# Patient Record
Sex: Female | Born: 1935 | Race: White | Hispanic: No | Smoking: Former smoker
Health system: Southern US, Community
[De-identification: ages and names within clinical notes are randomized; demographics above are authoritative.]

## PROBLEM LIST (undated history)

## (undated) DIAGNOSIS — N159 Renal tubulo-interstitial disease, unspecified: Secondary | ICD-10-CM

## (undated) DIAGNOSIS — K649 Unspecified hemorrhoids: Secondary | ICD-10-CM

## (undated) DIAGNOSIS — I2510 Atherosclerotic heart disease of native coronary artery without angina pectoris: Secondary | ICD-10-CM

## (undated) DIAGNOSIS — N3090 Cystitis, unspecified without hematuria: Secondary | ICD-10-CM

## (undated) DIAGNOSIS — N189 Chronic kidney disease, unspecified: Secondary | ICD-10-CM

## (undated) DIAGNOSIS — I482 Chronic atrial fibrillation, unspecified: Secondary | ICD-10-CM

## (undated) DIAGNOSIS — I639 Cerebral infarction, unspecified: Secondary | ICD-10-CM

## (undated) DIAGNOSIS — Z9889 Other specified postprocedural states: Secondary | ICD-10-CM

## (undated) DIAGNOSIS — E860 Dehydration: Secondary | ICD-10-CM

## (undated) DIAGNOSIS — D631 Anemia in chronic kidney disease: Secondary | ICD-10-CM

## (undated) DIAGNOSIS — I878 Other specified disorders of veins: Secondary | ICD-10-CM

## (undated) DIAGNOSIS — I7121 Aneurysm of the ascending aorta, without rupture: Secondary | ICD-10-CM

## (undated) DIAGNOSIS — N183 Chronic kidney disease, stage 3 unspecified: Secondary | ICD-10-CM

## (undated) DIAGNOSIS — D0510 Intraductal carcinoma in situ of unspecified breast: Secondary | ICD-10-CM

## (undated) DIAGNOSIS — I071 Rheumatic tricuspid insufficiency: Secondary | ICD-10-CM

## (undated) DIAGNOSIS — C439 Malignant melanoma of skin, unspecified: Secondary | ICD-10-CM

## (undated) DIAGNOSIS — I712 Thoracic aortic aneurysm, without rupture: Secondary | ICD-10-CM

## (undated) DIAGNOSIS — C4370 Malignant melanoma of unspecified lower limb, including hip: Secondary | ICD-10-CM

## (undated) DIAGNOSIS — Z86718 Personal history of other venous thrombosis and embolism: Secondary | ICD-10-CM

## (undated) DIAGNOSIS — S72009A Fracture of unspecified part of neck of unspecified femur, initial encounter for closed fracture: Secondary | ICD-10-CM

## (undated) DIAGNOSIS — I340 Nonrheumatic mitral (valve) insufficiency: Secondary | ICD-10-CM

## (undated) DIAGNOSIS — K259 Gastric ulcer, unspecified as acute or chronic, without hemorrhage or perforation: Secondary | ICD-10-CM

## (undated) DIAGNOSIS — D509 Iron deficiency anemia, unspecified: Principal | ICD-10-CM

## (undated) DIAGNOSIS — I10 Essential (primary) hypertension: Secondary | ICD-10-CM

## (undated) DIAGNOSIS — Y92009 Unspecified place in unspecified non-institutional (private) residence as the place of occurrence of the external cause: Secondary | ICD-10-CM

## (undated) DIAGNOSIS — W19XXXA Unspecified fall, initial encounter: Secondary | ICD-10-CM

## (undated) DIAGNOSIS — E079 Disorder of thyroid, unspecified: Secondary | ICD-10-CM

## (undated) DIAGNOSIS — I351 Nonrheumatic aortic (valve) insufficiency: Secondary | ICD-10-CM

## (undated) DIAGNOSIS — G459 Transient cerebral ischemic attack, unspecified: Secondary | ICD-10-CM

## (undated) DIAGNOSIS — I509 Heart failure, unspecified: Secondary | ICD-10-CM

## (undated) DIAGNOSIS — I428 Other cardiomyopathies: Secondary | ICD-10-CM

## (undated) HISTORY — PX: TONSILLECTOMY: SUR1361

## (undated) HISTORY — DX: Chronic atrial fibrillation, unspecified: I48.20

## (undated) HISTORY — DX: Chronic kidney disease, stage 3 unspecified: N18.30

## (undated) HISTORY — DX: Iron deficiency anemia, unspecified: D50.9

## (undated) HISTORY — DX: Unspecified place in unspecified non-institutional (private) residence as the place of occurrence of the external cause: Y92.009

## (undated) HISTORY — DX: Other specified postprocedural states: Z98.890

## (undated) HISTORY — DX: Chronic kidney disease, stage 3 (moderate): N18.3

## (undated) HISTORY — PX: CATARACT EXTRACTION, BILATERAL: SHX1313

## (undated) HISTORY — PX: CARDIAC CATHETERIZATION: SHX172

## (undated) HISTORY — DX: Nonrheumatic aortic (valve) insufficiency: I35.1

## (undated) HISTORY — DX: Dehydration: E86.0

## (undated) HISTORY — DX: Thoracic aortic aneurysm, without rupture: I71.2

## (undated) HISTORY — DX: Fracture of unspecified part of neck of unspecified femur, initial encounter for closed fracture: S72.009A

## (undated) HISTORY — PX: VARICOSE VEIN SURGERY: SHX832

## (undated) HISTORY — DX: Essential (primary) hypertension: I10

## (undated) HISTORY — PX: LEG SURGERY: SHX1003

## (undated) HISTORY — PX: TOTAL HIP ARTHROPLASTY: SHX124

## (undated) HISTORY — DX: Anemia in chronic kidney disease: D63.1

## (undated) HISTORY — DX: Rheumatic tricuspid insufficiency: I07.1

## (undated) HISTORY — DX: Cystitis, unspecified without hematuria: N30.90

## (undated) HISTORY — DX: Personal history of other venous thrombosis and embolism: Z86.718

## (undated) HISTORY — DX: Cerebral infarction, unspecified: I63.9

## (undated) HISTORY — DX: Other specified disorders of veins: I87.8

## (undated) HISTORY — DX: Renal tubulo-interstitial disease, unspecified: N15.9

## (undated) HISTORY — DX: Intraductal carcinoma in situ of unspecified breast: D05.10

## (undated) HISTORY — DX: Aneurysm of the ascending aorta, without rupture: I71.21

## (undated) HISTORY — DX: Transient cerebral ischemic attack, unspecified: G45.9

## (undated) HISTORY — PX: CHOLECYSTECTOMY: SHX55

## (undated) HISTORY — DX: Other cardiomyopathies: I42.8

## (undated) HISTORY — DX: Nonrheumatic mitral (valve) insufficiency: I34.0

## (undated) HISTORY — DX: Unspecified fall, initial encounter: W19.XXXA

## (undated) HISTORY — DX: Unspecified hemorrhoids: K64.9

## (undated) HISTORY — DX: Gastric ulcer, unspecified as acute or chronic, without hemorrhage or perforation: K25.9

## (undated) HISTORY — DX: Chronic kidney disease, unspecified: N18.9

## (undated) HISTORY — DX: Malignant melanoma of unspecified lower limb, including hip: C43.70

## (undated) HISTORY — DX: Disorder of thyroid, unspecified: E07.9

## (undated) HISTORY — DX: Malignant melanoma of skin, unspecified: C43.9

## (undated) HISTORY — PX: HEMORRHOID BANDING: SHX5850

---
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
## 1974-08-04 HISTORY — PX: PARTIAL HYSTERECTOMY: SHX80

## 1996-08-04 HISTORY — PX: BREAST LUMPECTOMY: SHX2

## 2000-04-09 ENCOUNTER — Ambulatory Visit: Admission: RE | Admit: 2000-04-09 | Discharge: 2000-04-09 | Payer: Self-pay | Admitting: Specialist

## 2000-04-09 ENCOUNTER — Encounter: Payer: Self-pay | Admitting: Specialist

## 2000-05-12 ENCOUNTER — Inpatient Hospital Stay (HOSPITAL_COMMUNITY): Admission: RE | Admit: 2000-05-12 | Discharge: 2000-05-15 | Payer: Self-pay | Admitting: Specialist

## 2000-05-12 ENCOUNTER — Encounter (INDEPENDENT_AMBULATORY_CARE_PROVIDER_SITE_OTHER): Payer: Self-pay | Admitting: Specialist

## 2000-05-12 ENCOUNTER — Encounter: Payer: Self-pay | Admitting: Specialist

## 2000-05-15 ENCOUNTER — Inpatient Hospital Stay
Admission: RE | Admit: 2000-05-15 | Discharge: 2000-05-27 | Payer: Self-pay | Admitting: Physical Medicine & Rehabilitation

## 2000-10-20 ENCOUNTER — Encounter: Payer: Self-pay | Admitting: Specialist

## 2000-10-20 ENCOUNTER — Ambulatory Visit (HOSPITAL_COMMUNITY): Admission: RE | Admit: 2000-10-20 | Discharge: 2000-10-20 | Payer: Self-pay | Admitting: Specialist

## 2001-04-21 ENCOUNTER — Ambulatory Visit (HOSPITAL_COMMUNITY): Admission: RE | Admit: 2001-04-21 | Discharge: 2001-04-21 | Payer: Self-pay | Admitting: General Surgery

## 2001-04-21 ENCOUNTER — Encounter: Payer: Self-pay | Admitting: General Surgery

## 2001-04-27 ENCOUNTER — Other Ambulatory Visit: Admission: RE | Admit: 2001-04-27 | Discharge: 2001-04-27 | Payer: Self-pay | Admitting: General Surgery

## 2001-11-02 ENCOUNTER — Encounter: Payer: Self-pay | Admitting: Family Medicine

## 2001-11-02 ENCOUNTER — Ambulatory Visit (HOSPITAL_COMMUNITY): Admission: RE | Admit: 2001-11-02 | Discharge: 2001-11-02 | Payer: Self-pay | Admitting: Family Medicine

## 2002-04-25 ENCOUNTER — Encounter: Payer: Self-pay | Admitting: General Surgery

## 2002-04-25 ENCOUNTER — Ambulatory Visit (HOSPITAL_COMMUNITY): Admission: RE | Admit: 2002-04-25 | Discharge: 2002-04-25 | Payer: Self-pay | Admitting: General Surgery

## 2002-08-26 ENCOUNTER — Ambulatory Visit (HOSPITAL_COMMUNITY): Admission: RE | Admit: 2002-08-26 | Discharge: 2002-08-26 | Payer: Self-pay | Admitting: Specialist

## 2002-08-26 ENCOUNTER — Encounter: Payer: Self-pay | Admitting: Specialist

## 2002-09-23 ENCOUNTER — Encounter: Payer: Self-pay | Admitting: Specialist

## 2002-09-27 ENCOUNTER — Encounter (INDEPENDENT_AMBULATORY_CARE_PROVIDER_SITE_OTHER): Payer: Self-pay | Admitting: *Deleted

## 2002-09-27 ENCOUNTER — Inpatient Hospital Stay (HOSPITAL_COMMUNITY): Admission: RE | Admit: 2002-09-27 | Discharge: 2002-10-04 | Payer: Self-pay | Admitting: Specialist

## 2002-09-27 ENCOUNTER — Encounter: Payer: Self-pay | Admitting: Specialist

## 2002-10-04 ENCOUNTER — Inpatient Hospital Stay: Admission: AD | Admit: 2002-10-04 | Discharge: 2002-10-21 | Payer: Self-pay | Admitting: Family Medicine

## 2003-03-23 ENCOUNTER — Inpatient Hospital Stay (HOSPITAL_COMMUNITY): Admission: EM | Admit: 2003-03-23 | Discharge: 2003-03-25 | Payer: Self-pay | Admitting: Emergency Medicine

## 2003-03-23 ENCOUNTER — Encounter: Payer: Self-pay | Admitting: *Deleted

## 2003-05-12 ENCOUNTER — Ambulatory Visit (HOSPITAL_COMMUNITY): Admission: RE | Admit: 2003-05-12 | Discharge: 2003-05-12 | Payer: Self-pay | Admitting: General Surgery

## 2003-05-12 ENCOUNTER — Encounter: Payer: Self-pay | Admitting: General Surgery

## 2003-10-24 ENCOUNTER — Ambulatory Visit (HOSPITAL_COMMUNITY): Admission: RE | Admit: 2003-10-24 | Discharge: 2003-10-24 | Payer: Self-pay | Admitting: Ophthalmology

## 2003-10-29 ENCOUNTER — Emergency Department (HOSPITAL_COMMUNITY): Admission: EM | Admit: 2003-10-29 | Discharge: 2003-10-29 | Payer: Self-pay | Admitting: Emergency Medicine

## 2004-02-13 ENCOUNTER — Ambulatory Visit (HOSPITAL_COMMUNITY): Admission: RE | Admit: 2004-02-13 | Discharge: 2004-02-13 | Payer: Self-pay | Admitting: Ophthalmology

## 2004-02-24 ENCOUNTER — Emergency Department (HOSPITAL_COMMUNITY): Admission: EM | Admit: 2004-02-24 | Discharge: 2004-02-24 | Payer: Self-pay | Admitting: Emergency Medicine

## 2004-03-05 ENCOUNTER — Ambulatory Visit (HOSPITAL_COMMUNITY): Admission: RE | Admit: 2004-03-05 | Discharge: 2004-03-05 | Payer: Self-pay | Admitting: Internal Medicine

## 2004-03-25 ENCOUNTER — Encounter: Admission: RE | Admit: 2004-03-25 | Discharge: 2004-03-25 | Payer: Self-pay | Admitting: Orthopedic Surgery

## 2004-05-22 ENCOUNTER — Ambulatory Visit (HOSPITAL_COMMUNITY): Admission: RE | Admit: 2004-05-22 | Discharge: 2004-05-22 | Payer: Self-pay | Admitting: General Surgery

## 2004-11-16 ENCOUNTER — Emergency Department (HOSPITAL_COMMUNITY): Admission: EM | Admit: 2004-11-16 | Discharge: 2004-11-16 | Payer: Self-pay | Admitting: Emergency Medicine

## 2004-12-25 ENCOUNTER — Ambulatory Visit (HOSPITAL_COMMUNITY): Admission: RE | Admit: 2004-12-25 | Discharge: 2004-12-25 | Payer: Self-pay | Admitting: Family Medicine

## 2005-01-02 ENCOUNTER — Ambulatory Visit: Payer: Self-pay | Admitting: Orthopedic Surgery

## 2005-01-15 ENCOUNTER — Ambulatory Visit: Payer: Self-pay | Admitting: Orthopedic Surgery

## 2005-01-22 ENCOUNTER — Ambulatory Visit: Payer: Self-pay | Admitting: Orthopedic Surgery

## 2005-01-29 ENCOUNTER — Ambulatory Visit: Payer: Self-pay | Admitting: Orthopedic Surgery

## 2005-03-03 ENCOUNTER — Ambulatory Visit: Payer: Self-pay | Admitting: Orthopedic Surgery

## 2005-03-07 ENCOUNTER — Ambulatory Visit (HOSPITAL_COMMUNITY): Admission: RE | Admit: 2005-03-07 | Discharge: 2005-03-07 | Payer: Self-pay | Admitting: Orthopedic Surgery

## 2005-03-07 ENCOUNTER — Encounter: Payer: Self-pay | Admitting: Orthopedic Surgery

## 2005-03-17 ENCOUNTER — Ambulatory Visit: Payer: Self-pay | Admitting: Orthopedic Surgery

## 2005-03-31 ENCOUNTER — Ambulatory Visit: Payer: Self-pay | Admitting: Orthopedic Surgery

## 2005-05-01 ENCOUNTER — Emergency Department (HOSPITAL_COMMUNITY): Admission: EM | Admit: 2005-05-01 | Discharge: 2005-05-01 | Payer: Self-pay | Admitting: Emergency Medicine

## 2005-05-19 ENCOUNTER — Ambulatory Visit (HOSPITAL_COMMUNITY): Admission: RE | Admit: 2005-05-19 | Discharge: 2005-05-19 | Payer: Self-pay | Admitting: Family Medicine

## 2005-05-27 ENCOUNTER — Ambulatory Visit (HOSPITAL_COMMUNITY): Admission: RE | Admit: 2005-05-27 | Discharge: 2005-05-27 | Payer: Self-pay | Admitting: Ophthalmology

## 2005-06-23 ENCOUNTER — Ambulatory Visit: Payer: Self-pay | Admitting: Orthopedic Surgery

## 2005-09-02 ENCOUNTER — Ambulatory Visit (HOSPITAL_COMMUNITY): Admission: RE | Admit: 2005-09-02 | Discharge: 2005-09-02 | Payer: Self-pay | Admitting: Ophthalmology

## 2005-09-29 ENCOUNTER — Ambulatory Visit: Payer: Self-pay | Admitting: Internal Medicine

## 2005-10-16 ENCOUNTER — Ambulatory Visit: Payer: Self-pay | Admitting: Internal Medicine

## 2005-10-16 ENCOUNTER — Ambulatory Visit (HOSPITAL_COMMUNITY): Admission: RE | Admit: 2005-10-16 | Discharge: 2005-10-16 | Payer: Self-pay | Admitting: Internal Medicine

## 2005-11-12 ENCOUNTER — Ambulatory Visit (HOSPITAL_COMMUNITY): Admission: RE | Admit: 2005-11-12 | Discharge: 2005-11-12 | Payer: Self-pay | Admitting: Family Medicine

## 2006-01-22 ENCOUNTER — Ambulatory Visit: Payer: Self-pay | Admitting: Internal Medicine

## 2006-04-02 ENCOUNTER — Ambulatory Visit: Payer: Self-pay | Admitting: Orthopedic Surgery

## 2006-05-04 HISTORY — PX: KNEE SURGERY: SHX244

## 2006-05-18 ENCOUNTER — Ambulatory Visit (HOSPITAL_COMMUNITY): Admission: RE | Admit: 2006-05-18 | Discharge: 2006-05-18 | Payer: Self-pay | Admitting: Family Medicine

## 2006-05-19 ENCOUNTER — Inpatient Hospital Stay (HOSPITAL_COMMUNITY): Admission: RE | Admit: 2006-05-19 | Discharge: 2006-05-22 | Payer: Self-pay | Admitting: Orthopedic Surgery

## 2006-05-19 ENCOUNTER — Ambulatory Visit: Payer: Self-pay | Admitting: Orthopedic Surgery

## 2006-05-19 ENCOUNTER — Encounter (INDEPENDENT_AMBULATORY_CARE_PROVIDER_SITE_OTHER): Payer: Self-pay | Admitting: *Deleted

## 2006-06-15 ENCOUNTER — Ambulatory Visit: Payer: Self-pay | Admitting: Orthopedic Surgery

## 2006-07-17 ENCOUNTER — Encounter (INDEPENDENT_AMBULATORY_CARE_PROVIDER_SITE_OTHER): Payer: Self-pay | Admitting: Specialist

## 2006-07-17 ENCOUNTER — Other Ambulatory Visit: Admission: RE | Admit: 2006-07-17 | Discharge: 2006-07-17 | Payer: Self-pay | Admitting: General Surgery

## 2006-07-31 ENCOUNTER — Ambulatory Visit (HOSPITAL_COMMUNITY): Admission: RE | Admit: 2006-07-31 | Discharge: 2006-07-31 | Payer: Self-pay | Admitting: General Surgery

## 2006-08-04 HISTORY — PX: MELANOMA EXCISION: SHX5266

## 2006-08-06 ENCOUNTER — Other Ambulatory Visit: Admission: RE | Admit: 2006-08-06 | Discharge: 2006-08-06 | Payer: Self-pay | Admitting: General Surgery

## 2006-08-06 ENCOUNTER — Encounter (INDEPENDENT_AMBULATORY_CARE_PROVIDER_SITE_OTHER): Payer: Self-pay | Admitting: Specialist

## 2006-08-10 ENCOUNTER — Ambulatory Visit: Payer: Self-pay | Admitting: Orthopedic Surgery

## 2006-08-24 ENCOUNTER — Encounter (HOSPITAL_COMMUNITY): Admission: RE | Admit: 2006-08-24 | Discharge: 2006-09-23 | Payer: Self-pay | Admitting: Oncology

## 2006-08-24 ENCOUNTER — Ambulatory Visit (HOSPITAL_COMMUNITY): Payer: Self-pay | Admitting: Oncology

## 2006-09-19 ENCOUNTER — Emergency Department (HOSPITAL_COMMUNITY): Admission: EM | Admit: 2006-09-19 | Discharge: 2006-09-19 | Payer: Self-pay | Admitting: Emergency Medicine

## 2006-11-09 ENCOUNTER — Ambulatory Visit: Payer: Self-pay | Admitting: Orthopedic Surgery

## 2007-04-22 ENCOUNTER — Encounter (HOSPITAL_COMMUNITY): Admission: RE | Admit: 2007-04-22 | Discharge: 2007-05-04 | Payer: Self-pay | Admitting: Oncology

## 2007-04-22 ENCOUNTER — Ambulatory Visit (HOSPITAL_COMMUNITY): Payer: Self-pay | Admitting: Oncology

## 2007-05-17 ENCOUNTER — Ambulatory Visit: Payer: Self-pay | Admitting: Orthopedic Surgery

## 2007-06-07 ENCOUNTER — Ambulatory Visit: Payer: Self-pay | Admitting: Orthopedic Surgery

## 2007-06-07 DIAGNOSIS — M25569 Pain in unspecified knee: Secondary | ICD-10-CM

## 2007-06-07 DIAGNOSIS — M25559 Pain in unspecified hip: Secondary | ICD-10-CM | POA: Insufficient documentation

## 2007-08-12 ENCOUNTER — Ambulatory Visit (HOSPITAL_COMMUNITY): Admission: RE | Admit: 2007-08-12 | Discharge: 2007-08-12 | Payer: Self-pay | Admitting: General Surgery

## 2007-08-23 ENCOUNTER — Ambulatory Visit (HOSPITAL_COMMUNITY): Payer: Self-pay | Admitting: Oncology

## 2007-08-25 ENCOUNTER — Ambulatory Visit (HOSPITAL_COMMUNITY): Admission: RE | Admit: 2007-08-25 | Discharge: 2007-08-25 | Payer: Self-pay | Admitting: Dermatology

## 2007-11-15 ENCOUNTER — Ambulatory Visit (HOSPITAL_COMMUNITY): Payer: Self-pay | Admitting: Oncology

## 2007-11-15 ENCOUNTER — Encounter (HOSPITAL_COMMUNITY): Admission: RE | Admit: 2007-11-15 | Discharge: 2007-12-15 | Payer: Self-pay | Admitting: Oncology

## 2007-11-23 ENCOUNTER — Ambulatory Visit: Payer: Self-pay | Admitting: Orthopedic Surgery

## 2007-11-23 DIAGNOSIS — IMO0002 Reserved for concepts with insufficient information to code with codable children: Secondary | ICD-10-CM | POA: Insufficient documentation

## 2007-11-23 DIAGNOSIS — M1710 Unilateral primary osteoarthritis, unspecified knee: Secondary | ICD-10-CM

## 2008-01-14 ENCOUNTER — Ambulatory Visit (HOSPITAL_COMMUNITY): Admission: RE | Admit: 2008-01-14 | Discharge: 2008-01-14 | Payer: Self-pay | Admitting: Family Medicine

## 2008-02-01 ENCOUNTER — Ambulatory Visit: Payer: Self-pay | Admitting: Vascular Surgery

## 2008-02-15 ENCOUNTER — Ambulatory Visit (HOSPITAL_COMMUNITY): Payer: Self-pay | Admitting: Oncology

## 2008-02-15 ENCOUNTER — Encounter (HOSPITAL_COMMUNITY): Admission: RE | Admit: 2008-02-15 | Discharge: 2008-03-16 | Payer: Self-pay | Admitting: Oncology

## 2008-03-02 ENCOUNTER — Ambulatory Visit (HOSPITAL_COMMUNITY): Admission: RE | Admit: 2008-03-02 | Discharge: 2008-03-02 | Payer: Self-pay | Admitting: Dermatology

## 2008-04-12 ENCOUNTER — Telehealth: Payer: Self-pay | Admitting: Orthopedic Surgery

## 2008-06-06 ENCOUNTER — Ambulatory Visit (HOSPITAL_COMMUNITY): Admission: RE | Admit: 2008-06-06 | Discharge: 2008-06-06 | Payer: Self-pay | Admitting: Family Medicine

## 2008-08-01 ENCOUNTER — Encounter (HOSPITAL_COMMUNITY): Admission: RE | Admit: 2008-08-01 | Discharge: 2008-08-31 | Payer: Self-pay | Admitting: Oncology

## 2008-08-01 ENCOUNTER — Ambulatory Visit (HOSPITAL_COMMUNITY): Payer: Self-pay | Admitting: Oncology

## 2008-08-21 ENCOUNTER — Ambulatory Visit (HOSPITAL_COMMUNITY): Admission: RE | Admit: 2008-08-21 | Discharge: 2008-08-21 | Payer: Self-pay | Admitting: Family Medicine

## 2008-09-01 ENCOUNTER — Encounter: Payer: Self-pay | Admitting: Orthopedic Surgery

## 2008-09-01 ENCOUNTER — Ambulatory Visit (HOSPITAL_COMMUNITY): Admission: RE | Admit: 2008-09-01 | Discharge: 2008-09-01 | Payer: Self-pay | Admitting: Family Medicine

## 2008-09-06 ENCOUNTER — Encounter (HOSPITAL_COMMUNITY): Admission: RE | Admit: 2008-09-06 | Discharge: 2008-10-06 | Payer: Self-pay | Admitting: Oncology

## 2008-09-28 ENCOUNTER — Ambulatory Visit: Payer: Self-pay | Admitting: Internal Medicine

## 2008-10-02 DIAGNOSIS — Z9889 Other specified postprocedural states: Secondary | ICD-10-CM

## 2008-10-02 HISTORY — DX: Other specified postprocedural states: Z98.890

## 2008-10-10 ENCOUNTER — Encounter: Payer: Self-pay | Admitting: Internal Medicine

## 2008-10-10 ENCOUNTER — Ambulatory Visit: Payer: Self-pay | Admitting: Internal Medicine

## 2008-10-10 ENCOUNTER — Ambulatory Visit (HOSPITAL_COMMUNITY): Admission: RE | Admit: 2008-10-10 | Discharge: 2008-10-10 | Payer: Self-pay | Admitting: Internal Medicine

## 2008-10-14 ENCOUNTER — Encounter: Payer: Self-pay | Admitting: Internal Medicine

## 2008-11-29 ENCOUNTER — Ambulatory Visit: Payer: Self-pay | Admitting: Orthopedic Surgery

## 2009-01-08 ENCOUNTER — Ambulatory Visit: Payer: Self-pay | Admitting: Vascular Surgery

## 2009-05-29 ENCOUNTER — Ambulatory Visit: Payer: Self-pay | Admitting: Orthopedic Surgery

## 2009-05-29 DIAGNOSIS — M76899 Other specified enthesopathies of unspecified lower limb, excluding foot: Secondary | ICD-10-CM | POA: Insufficient documentation

## 2009-08-13 ENCOUNTER — Encounter (HOSPITAL_COMMUNITY): Admission: RE | Admit: 2009-08-13 | Discharge: 2009-09-12 | Payer: Self-pay | Admitting: Oncology

## 2009-08-13 ENCOUNTER — Ambulatory Visit (HOSPITAL_COMMUNITY): Payer: Self-pay | Admitting: Oncology

## 2009-08-24 ENCOUNTER — Ambulatory Visit (HOSPITAL_COMMUNITY): Admission: RE | Admit: 2009-08-24 | Discharge: 2009-08-24 | Payer: Self-pay | Admitting: General Surgery

## 2009-08-31 ENCOUNTER — Encounter: Payer: Self-pay | Admitting: Internal Medicine

## 2009-09-11 ENCOUNTER — Inpatient Hospital Stay (HOSPITAL_COMMUNITY): Admission: AD | Admit: 2009-09-11 | Discharge: 2009-09-12 | Payer: Self-pay | Admitting: Internal Medicine

## 2009-09-11 ENCOUNTER — Encounter (INDEPENDENT_AMBULATORY_CARE_PROVIDER_SITE_OTHER): Payer: Self-pay | Admitting: Internal Medicine

## 2009-09-11 ENCOUNTER — Ambulatory Visit: Payer: Self-pay | Admitting: Cardiology

## 2009-09-21 ENCOUNTER — Encounter (HOSPITAL_COMMUNITY): Admission: RE | Admit: 2009-09-21 | Discharge: 2009-10-21 | Payer: Self-pay | Admitting: Oncology

## 2009-09-28 ENCOUNTER — Ambulatory Visit (HOSPITAL_COMMUNITY): Payer: Self-pay | Admitting: Oncology

## 2009-10-29 ENCOUNTER — Encounter (HOSPITAL_COMMUNITY): Admission: RE | Admit: 2009-10-29 | Discharge: 2009-11-28 | Payer: Self-pay | Admitting: Oncology

## 2010-03-06 ENCOUNTER — Ambulatory Visit (HOSPITAL_COMMUNITY)
Admission: RE | Admit: 2010-03-06 | Discharge: 2010-03-06 | Payer: Self-pay | Source: Home / Self Care | Admitting: Family Medicine

## 2010-03-27 HISTORY — PX: NM MYOCAR PERF WALL MOTION: HXRAD629

## 2010-05-27 ENCOUNTER — Ambulatory Visit: Payer: Self-pay | Admitting: Orthopedic Surgery

## 2010-08-12 ENCOUNTER — Ambulatory Visit (HOSPITAL_COMMUNITY)
Admission: RE | Admit: 2010-08-12 | Discharge: 2010-09-03 | Payer: Self-pay | Source: Home / Self Care | Attending: Oncology | Admitting: Oncology

## 2010-08-12 ENCOUNTER — Encounter (HOSPITAL_COMMUNITY)
Admission: RE | Admit: 2010-08-12 | Discharge: 2010-09-03 | Payer: Self-pay | Source: Home / Self Care | Attending: Oncology | Admitting: Oncology

## 2010-08-19 LAB — COMPREHENSIVE METABOLIC PANEL
ALT: 13 U/L (ref 0–35)
AST: 22 U/L (ref 0–37)
Albumin: 4 g/dL (ref 3.5–5.2)
Alkaline Phosphatase: 68 U/L (ref 39–117)
BUN: 16 mg/dL (ref 6–23)
CO2: 30 mEq/L (ref 19–32)
Calcium: 8.8 mg/dL (ref 8.4–10.5)
Chloride: 105 mEq/L (ref 96–112)
Creatinine, Ser: 0.77 mg/dL (ref 0.4–1.2)
GFR calc Af Amer: >60 mL/min (ref >60)
GFR calc non Af Amer: >60 mL/min (ref >60)
Glucose, Bld: 91 mg/dL (ref 70–99)
Potassium: 3.4 mEq/L — ABNORMAL LOW (ref 3.5–5.1)
Sodium: 144 mEq/L (ref 135–145)
Total Bilirubin: 1.1 mg/dL (ref 0.3–1.2)
Total Protein: 6.7 g/dL (ref 6.0–8.3)

## 2010-08-19 LAB — DIFFERENTIAL
Basophils Absolute: 0 10*3/uL (ref 0.0–0.1)
Basophils Relative: 1 % (ref 0–1)
Eosinophils Absolute: 0.1 10*3/uL (ref 0.0–0.7)
Eosinophils Relative: 2 % (ref 0–5)
Lymphocytes Relative: 39 % (ref 12–46)
Lymphs Abs: 2 10*3/uL (ref 0.7–4.0)
Monocytes Absolute: 0.3 10*3/uL (ref 0.1–1.0)
Monocytes Relative: 6 % (ref 3–12)
Neutro Abs: 2.8 10*3/uL (ref 1.7–7.7)
Neutrophils Relative %: 53 % (ref 43–77)

## 2010-08-19 LAB — CBC
HCT: 38.8 % (ref 36.0–46.0)
Hemoglobin: 13 g/dL (ref 12.0–15.0)
MCH: 30.1 pg (ref 26.0–34.0)
MCHC: 33.5 g/dL (ref 30.0–36.0)
MCV: 89.8 fL (ref 78.0–100.0)
Platelets: 147 10*3/uL — ABNORMAL LOW (ref 150–400)
RBC: 4.32 MIL/uL (ref 3.87–5.11)
RDW: 13.5 % (ref 11.5–15.5)
WBC: 5.3 10*3/uL (ref 4.0–10.5)

## 2010-08-19 LAB — FERRITIN: Ferritin: 36 ng/mL (ref 10–291)

## 2010-08-25 ENCOUNTER — Encounter: Payer: Self-pay | Admitting: Internal Medicine

## 2010-08-26 ENCOUNTER — Ambulatory Visit (HOSPITAL_COMMUNITY)
Admission: RE | Admit: 2010-08-26 | Discharge: 2010-08-26 | Payer: Self-pay | Source: Home / Self Care | Attending: Family Medicine | Admitting: Family Medicine

## 2010-08-29 ENCOUNTER — Encounter: Payer: Self-pay | Admitting: Internal Medicine

## 2010-09-05 NOTE — Letter (Signed)
Summary: [facility]   Imported By: Hoy Morn 08/29/2010 09:41:36  _____________________________________________________________________  External Attachment:    Type:   Image     Comment:   External Document

## 2010-09-05 NOTE — Assessment & Plan Note (Signed)
Summary: yearly recheck on TKA/NEEDS XRAYS/frs   Visit Type:  Follow-up  CC:  right knee replacement.  History of Present Illness: I saw [name] [name] in the office today for a 1 year followup visit.  She is a 77 years old woman with the complaint of:  right knee replacement  Xrays today.  05/19/2006 right total knee replacement.  Medications: same in chart. They added a blood pressure pill, she does not know the name.  The patient reports normal function in her RIGHT knee.  She uses a cane when she is out in the community for fear of falling but no problems with the RIGHT knee  X-rays are obtained x-rays look good  Patient will follow up next year  Allergies: 1)  ! Penicillin 2)  ! Codeine  Review of Systems Musculoskeletal:  denies catching locking or giving way of the RIGHT knee.   Impression & Recommendations: right knee xrays  The  alignment was normal, PTF reduced without tilt or subluxation, no evidence of loosening.  IMPRESSION: normal appearance of the implant   Other Orders: Est. Patient Level III [id number]) Knee x-ray,  3 views [id number])  Patient Instructions: 1)  come back in a year for TKA recheck with xrays 2)  Get some over the counter aspercreme, rub onto knee three times a day.   Orders Added: 1)  Est. Patient Level III [id number] 2)  Knee x-ray,  3 views [id number]

## 2010-09-05 NOTE — Letter (Signed)
Summary: [place]   Imported By: Zeb Comfort 08/31/2009 11:51:36  _____________________________________________________________________  External Attachment:    Type:   Image     Comment:   External Document

## 2010-10-19 LAB — COMPREHENSIVE METABOLIC PANEL
Albumin: 3.7 g/dL (ref 3.5–5.2)
BUN: 16 mg/dL (ref 6–23)
Calcium: 8.5 mg/dL (ref 8.4–10.5)
Chloride: 106 mEq/L (ref 96–112)
Creatinine, Ser: 1.12 mg/dL (ref 0.4–1.2)
GFR calc Af Amer: 56 mL/min — ABNORMAL LOW (ref >60)
GFR calc non Af Amer: 46 mL/min — ABNORMAL LOW (ref >60)
Total Bilirubin: 0.3 mg/dL (ref 0.3–1.2)

## 2010-10-19 LAB — CBC
HCT: 35 % — ABNORMAL LOW (ref 36.0–46.0)
Hemoglobin: 11.7 g/dL — ABNORMAL LOW (ref 12.0–15.0)
RBC: 3.96 MIL/uL (ref 3.87–5.11)
RDW: 16.1 % — ABNORMAL HIGH (ref 11.5–15.5)
WBC: 7.1 10*3/uL (ref 4.0–10.5)

## 2010-10-19 LAB — DIFFERENTIAL
Basophils Relative: 0 % (ref 0–1)
Monocytes Absolute: 0.5 10*3/uL (ref 0.1–1.0)
Neutro Abs: 4.4 10*3/uL (ref 1.7–7.7)

## 2010-10-22 ENCOUNTER — Encounter (HOSPITAL_COMMUNITY): Payer: Medicare Other | Attending: Oncology

## 2010-10-22 ENCOUNTER — Other Ambulatory Visit (HOSPITAL_COMMUNITY): Payer: Medicare Other

## 2010-10-22 DIAGNOSIS — D509 Iron deficiency anemia, unspecified: Secondary | ICD-10-CM | POA: Insufficient documentation

## 2010-10-22 DIAGNOSIS — D649 Anemia, unspecified: Secondary | ICD-10-CM

## 2010-10-22 DIAGNOSIS — Z79899 Other long term (current) drug therapy: Secondary | ICD-10-CM | POA: Insufficient documentation

## 2010-10-22 DIAGNOSIS — Z85820 Personal history of malignant melanoma of skin: Secondary | ICD-10-CM | POA: Insufficient documentation

## 2010-10-22 DIAGNOSIS — Z853 Personal history of malignant neoplasm of breast: Secondary | ICD-10-CM | POA: Insufficient documentation

## 2010-10-23 LAB — URINALYSIS, ROUTINE W REFLEX MICROSCOPIC
Ketones, ur: NEGATIVE mg/dL
Nitrite: NEGATIVE
Protein, ur: NEGATIVE mg/dL

## 2010-10-23 LAB — COMPREHENSIVE METABOLIC PANEL
ALT: 13 U/L (ref 0–35)
Albumin: 3.7 g/dL (ref 3.5–5.2)
BUN: 8 mg/dL (ref 6–23)
CO2: 29 mEq/L (ref 19–32)
Calcium: 8.9 mg/dL (ref 8.4–10.5)
Chloride: 107 mEq/L (ref 96–112)
GFR calc Af Amer: >60 mL/min (ref >60)
Glucose, Bld: 124 mg/dL — ABNORMAL HIGH (ref 70–99)
Potassium: 3.8 mEq/L (ref 3.5–5.1)
Total Bilirubin: 0.9 mg/dL (ref 0.3–1.2)
Total Protein: 6.7 g/dL (ref 6.0–8.3)

## 2010-10-23 LAB — MRSA PCR SCREENING: MRSA by PCR: NEGATIVE

## 2010-10-23 LAB — DIFFERENTIAL
Eosinophils Relative: 0 % (ref 0–5)
Lymphs Abs: 1.5 10*3/uL (ref 0.7–4.0)
Monocytes Relative: 6 % (ref 3–12)
Neutrophils Relative %: 65 % (ref 43–77)

## 2010-10-23 LAB — CBC
HCT: 35.1 % — ABNORMAL LOW (ref 36.0–46.0)
Hemoglobin: 11.7 g/dL — ABNORMAL LOW (ref 12.0–15.0)
MCHC: 33.5 g/dL (ref 30.0–36.0)
MCV: 87.4 fL (ref 78.0–100.0)
RBC: 4.01 MIL/uL (ref 3.87–5.11)
RDW: 14.3 % (ref 11.5–15.5)

## 2010-10-23 LAB — CARDIAC PANEL(CRET KIN+CKTOT+MB+TROPI): CK, MB: 1.9 ng/mL (ref 0.3–4.0)

## 2010-10-23 LAB — TSH: TSH: 1.523 u[IU]/mL (ref 0.350–4.500)

## 2010-10-23 LAB — D-DIMER, QUANTITATIVE: D-Dimer, Quant: 2.08 ug/mL-FEU — ABNORMAL HIGH (ref 0.00–0.48)

## 2010-10-27 LAB — CBC
Hemoglobin: 12.7 g/dL (ref 12.0–15.0)
MCHC: 34.7 g/dL (ref 30.0–36.0)
MCV: 88.5 fL (ref 78.0–100.0)
RBC: 4.13 MIL/uL (ref 3.87–5.11)

## 2010-11-18 LAB — COMPREHENSIVE METABOLIC PANEL
AST: 22 U/L (ref 0–37)
BUN: 26 mg/dL — ABNORMAL HIGH (ref 6–23)
CO2: 26 mEq/L (ref 19–32)
Chloride: 107 mEq/L (ref 96–112)
Creatinine, Ser: 1.07 mg/dL (ref 0.4–1.2)
GFR calc non Af Amer: 49 mL/min — ABNORMAL LOW (ref >60)
Total Bilirubin: 0.6 mg/dL (ref 0.3–1.2)

## 2010-11-18 LAB — IRON AND TIBC
Saturation Ratios: 29 % (ref 20–55)
TIBC: 383 ug/dL (ref 250–470)

## 2010-12-17 NOTE — Op Note (Signed)
[name] [name], [name] [name]               ACCOUNT NO.:  [id number]   MEDICAL RECORD NO.:  [id number]          PATIENT TYPE:  AMB   LOCATION:  DAY                           FACILITY:  [facility]   PHYSICIAN:  R. Garfield Cornea, M.D. DATE OF BIRTH:  August 16, 1922   DATE OF PROCEDURE:  10/10/2008  DATE OF DISCHARGE:                               OPERATIVE REPORT   Colonoscopy with snare polypectomy, biopsy.   INDICATIONS FOR PROCEDURE:  An 77 year old lady with intermittent rectal  bleeding in the setting of hemorrhoids.  Also history of diverticulosis  and some fecal seepage along the way.  Colonoscopy is now being done.  Risks, benefits, alternatives and limitations have been discussed,  questions answered.  Please see documentation in the medical record.   PROCEDURE NOTE:  O2 saturation, blood pressure, pulse and respirations  monitored throughout the entire procedure.  Conscious sedation Versed 3  mg IV, Demerol 75 mg IV in divided doses.   INSTRUMENTATION:  Pentax video chip system.   Digital rectal exam revealed no abnormalities.   ENDOSCOPIC FINDINGS:  Prep was adequate.   Colon:  Colonic mucosa was surveyed from the rectosigmoid junction  through the left, transverse, right colon to the area of appendiceal  orifice and ileocecal valve/cecum.  These structures were well seen and  photographed for the record.  From this level, the scope was slowly and  cautiously withdrawn.  All previously mentioned mucosal surfaces were  again seen.  The patient was noted to have left-sided transverse  diverticula.  There was a 5-mm pedunculated polyp in the mid ascending  colon which was cold snared and recovered through the scope.  There was  a 2-mm diminutive polyp in the mid descending colon which was cold  biopsied.  The remainder of the colonic mucosa appeared normal.  The  scope was pulled down into the rectum where thorough examination of the  rectal mucosa including retroflexed view of the anal  verge and en face  view of the anal canal demonstrated some minimally friable anal canal  hemorrhoids.  The patient tolerated the procedure well and was reactive  to endoscopy.  Cecal withdrawal time 13 minutes.   IMPRESSION:  1. Friable anal canal hemorrhoids, otherwise normal rectum.  2. Left-sided transverse diverticula.  3. Polyps removed as described above.   RECOMMENDATIONS:  1. Follow-up on pathology.  2. Daily Benefiber supplement, 1 tablespoon daily.  A 10-day course of      Anusol HC suppositories 1 per rectum at bedtime.  Further      recommendations to follow pending review of pathology report.      Bridgette Habermann, M.D.  Electronically Signed     RMR/MEDQ  D:  10/10/2008  T:  10/10/2008  Job:  [id number]   cc:   Gaston Islam. Tressie Stalker, MD  Fax: [phone]   [name], M.D.  Fax: [phone]   Felicie Morn, M.D.  Fax: [phone]

## 2010-12-17 NOTE — Assessment & Plan Note (Signed)
[name], [name]                CHART#:  [id number]   DATE:  09/28/2008                       DOB:  Apr 05, 1923   CHIEF COMPLAINT:  Intermittent hematochezia, fecal incontinence.   HISTORY:  [name] [name] is a very pleasant 77 year old Caucasian  female with known hemorrhoids followed primarily by Dr. Gust Rung who came  to see me today with a complaint of worsening hematochezia  intermittently, three to four bowel movements daily, although she denies  diarrhea.  She does have frequent bowel movements and oftentimes has  fecal seepage, leakage and episodes of incontinence.  She has a  difficult time with personal hygiene.  She has not had any associated  abdominal pain.  No melena.  No upper GI tract symptoms.  No  odynophagia, dysphagia or early satiety or reflux symptoms.  She does  take Plavix, based on what I can tell for prophylaxis against  thromboembolism.  She has a prosthetic left hip and right knee.  She has  been on Nexium concomitantly presumably for cytoprotection.  She she has  a history of complicated peptic ulcer disease with hemorrhage and  Helicobacter pylori previously treated.  This lady has been followed by  Dr. Laural Golden over the years.  I actually stood in for him back in 2007 and  performed an EGD and colonoscopy.  At that time she had a history of  complicated peptic ulcer disease, intermittent rectal bleeding, history  of high-grade adenoma.  She had been taking Celebrex previously.  Colonoscopy demonstrated internal hemorrhoids and sigmoid diverticula.  EGD demonstrated normal esophagus, small hiatal hernia, normal D1, D2.  She is not taking nonsteroidals any longer, however, stated is taking  Plavix 75 mg orally daily.   Dr. Gust Rung reportedly gave [name] [name] some Anucort suppositories which  she uses from time to time and her bleeding worsens.  This improves but  does not alleviate bleeding.   PAST MEDICAL HISTORY:  Significant for left hip replacement  x2,  tendinitis left hip, peptic ulcer disease secondary Helicobacter pylori  requiring therapeutic endoscopy previously, status post tubal ligation,  bladder sling surgery, partial hysterectomy, varicose vein surgery, left  cataract surgery, status post lumpectomy for benign breast disease.  History of melanoma left leg resected by Dr. Romona Curls.   CURRENT MEDICATIONS:  1. ________ 20 mg daily.  2. Propo-N/APAP 650 mg q.4 hours p.r.n. pain.  3. Nexium 40 mg daily.  4. Anucort suppositories b.i.d.  5. Plavix 75 mg daily.   ALLERGIES:  PENICILLIN, CODEINE.   FAMILY HISTORY:  Negative for chronic GI or liver illness.   SOCIAL HISTORY:  The patient lives alone.  She is a widow.  She has five  healthy grandchildren.  She is retired.  She does not use tobacco or  alcohol.   REVIEW OF SYSTEMS:  No chest pain.  No dyspnea on exertion.  She has  lost 14 pounds since she was last weighed on 09/29/2005.   PHYSICAL EXAMINATION:  GENERAL:  A pleasant 77 year old lady resting  comfortably.  VITAL SIGNS:  Height 5 feet 6 inches, temperature 98.2, BP 130/84, pulse  60.  SKIN:  Warm and dry.  HEENT:  No scleral icterus.  Conjunctivae are pink.  CHEST:  Lungs are clear to auscultation.  CARDIAC:  Regular rate and rhythm without murmur, gallop or rub.  BREASTS:  Deferred.  ABDOMEN:  Nondistended.  Positive bowel sounds.  Soft, nontender.  No  appreciable mass or organomegaly.  EXTREMITIES:  No edema.  RECTAL:  External hemorrhoids.  No mass in the rectal vault.  No stool  in the rectal vault.  Mucus Hemoccult negative.   IMPRESSION:  [name] is a very pleasant 77 year old lady  with recurrent intermittent rectal bleeding in the setting of known  internal and external hemorrhoids.  She also complains now of some fecal  seepage and difficulty with personal hygiene.   It has been a good 3 years since she last had her colon surveilled.  Given ongoing hematochezia although I suspect  this is related to benign  anorectal source, i.e. hemorrhoids I think we ought to go ahead and look  at her lower GI tract once again and size things up.  I doubt she has a  fissure with a paucity of pain.  She did have pretty good sphincter  tone.  I doubt she has colitis.   As a separate issue she is taking Plavix presumably for thromboembolic  disease prophylaxis and also takes Nexium concomitantly with the new FDA  recommendations I have recommended she stop Nexium and begin Protonix 40  mg orally daily primarily for cytoprotection.  I have given her a  prescription today.  We talked about proceeding with a diagnostic  colonoscopy.  Risks, benefits and alternatives and limitations have been  reviewed and questions answered.  She is  agreeable.  We will plan to perform a colonoscopy in the very near  future.  We will allow her to remain on Plavix for the procedure.       Bridgette Habermann, M.D.  Electronically Signed     RMR/MEDQ  D:  09/28/2008  T:  09/28/2008  Job:  [id number]   cc:   [name] [name], M.D.  [name] [name], M.D.  [name] [name]. [name] Stalker, MD

## 2010-12-17 NOTE — Procedures (Signed)
LOWER EXTREMITY VENOUS REFLUX EXAM   INDICATION:  Bilateral varicose veins with bilateral vein stripping in  the past.   EXAM:  Using color-flow imaging and pulse Doppler spectral analysis, the  right and left common femoral, superficial femoral, popliteal, posterior  tibial, greater and lesser saphenous veins are evaluated.  There is  evidence suggesting deep venous insufficiency in the right and left  lower extremities.   The right and left saphenofemoral junction are not competent.  The right  and left GSV's appear to have been stripped with large tortuous  accessory veins visualized.   The bilateral proximal short saphenous vein demonstrate incompetency  with the right measuring 0.43 cm to 0.7 cm, and the left 0.26 cm to 0.38  cm.   GSV Diameter (used if found to be incompetent only)                                            Right    Left  Proximal Greater Saphenous Vein           cm       cm  Proximal-to-mid-thigh                     cm       cm  Mid thigh                                 cm       cm  Mid-distal thigh                          cm       cm  Distal thigh                              cm       cm  Knee                                      cm       cm   IMPRESSION:  1. Right and left greater saphenous veins show evidence of stripping      with large accessory veins and varicosities visualized.  2. The right and left greater saphenous accessory veins are tortuous.  3. The deep venous system is not competent bilaterally.  4. The right and left lesser saphenous veins are not competent.  5. Superficial venous thrombosis was noted in bilateral calves and in      the left thigh.    ___________________________________________  Nelda Severe. Kellie Simmering, M.D.   AS/MEDQ  D:  01/08/2009  T:  01/08/2009  Job:  [id number]

## 2010-12-17 NOTE — Consult Note (Signed)
NEW PATIENT CONSULTATION   [name] [name], [name] [name]  DOB:  [birth year]                                       01/08/2009  [id number]   The patient is an 77 year old female referred by Dr. Orson Ape for severe  venous insufficiency of both lower extremities.  This lady states that  many years ago (greater than 30) she had bilateral great saphenous vein  ligation and stripping with excision of multiple varicosities, and had a  good result.  This was done in [place], [place].  She has no history  of deep vein thrombosis, but over the years has developed recurrent  varicosities in both lower extremities, which are quite extensive,  involving both thighs, calves, both anteriorly, posteriorly, and  medially.  She has no history of bleeding or stasis ulceration, although  she occasionally develops some skin rash in her legs, which are not  located in the ankle areas.  She has some mild swelling as the day  progresses and does have some burning and throbbing discomfort, which is  relieved by elevation of the legs.  She does not wear elastic  compression stockings.  She has a history of thrombophlebitis in the  left medial calf 1 year ago.   PAST MEDICAL HISTORY:  1. Hyperlipidemia.  2. Arthritis.  3. History of possible TIA.  4. Negative for diabetes, hypertension, coronary artery disease, COPD.   PREVIOUS SURGERY:  1. Bilateral ligation and stripping great saphenous vein.  2. Left hip replacement x2.  3. Right total knee arthroplasty.  4. Breast surgery for breast cancer, no mastectomy.  5. Excision of melanoma, left leg.   FAMILY HISTORY:  Positive for varicose veins in her father, negative for  coronary artery disease, diabetes, or stroke.   SOCIAL HISTORY:  She is widowed and has 5 children.  Is retired.  She  has not smoked since 1976.  Does not use alcohol.   REVIEW OF SYSTEMS:  Positive for dyspnea on exertion, reflux  esophagitis, hiatal hernia, occasional  diarrhea and/or constipation.  Temporary loss of vision in the eyes in the past when she had the mini-  stroke.  Dizziness, headaches, arthritis, and joints pains.   ALLERGIES:  Penicillin and codeine.   MEDICATIONS:  Please see health history form.   PHYSICAL EXAM:  Blood pressure 170/70, heart rate 56, respirations 14.  Generally, she is an elderly female in no apparent distress.  Alert and  oriented x3.  Neck is supple, 3+ carotid pulses palpable.  No bruits are  audible.  Neurologic exam is normal.  No palpable adenopathy in the  neck.  Chest is clear to auscultation.  Cardiovascular exam reveals a  regular rhythm, no murmurs.  Abdomen is soft, nontender.  No masses.  She has 3+ femoral, popliteal, and dorsalis pedis pulses bilaterally.  Both legs have diffuse and severe varicosities.  Right leg involves the  medial thigh, medial calf, and lateral calf with multiple reticular and  spider veins distally.  The left leg has medial varicosities in the left  thigh, as well as posteriorly in the popliteal area and lateral calf.  There is mild edema distally bilaterally.   Venous duplex exam reveals severe deep venous reflux bilaterally.  Both  great saphenous veins are absent and there are (corkscrew) areas of  superficial reflux throughout the thighs bilaterally communicating with  the saphenofemoral junction with torturous varicosities and both small  saphenous veins have reflux with diffuse varicosities.   She has such a diffuse problem, as well as deep venous reflux, and at  her age of 47, I would not recommend any treatment on this lady at this  time other than elastic compression stockings.  She does not have any  severe complications and her pain does not seem to be severely affecting  her daily living.  I have discussed this with her and her daughter and  they are in agreement.  She will return to see [place] on a p.r.n. basis.   Nelda Severe Kellie Simmering, M.D.  Electronically Signed    JDL/MEDQ  D:  01/08/2009  T:  01/09/2009  Job:  [id number]   cc:   [name], M.D.

## 2010-12-17 NOTE — Consult Note (Signed)
VASCULAR SURGERY CONSULTATION   BUNTE, Swayzee P  DOB:  1923-02-23                                       02/01/2008  [id number]   REASON FOR CONSULTATION:  Chronic venous insufficiency and phlebitis.   HISTORY:  This is a pleasant 77 year old woman who developed some pain  and inflammation along the medial aspect of her distal left leg in early  June.  This prompted a duplex scan which showed no evidence of DVT on  the left side.  However, there were large varicose veins along the left  medial calf which showed evidence of partial thrombosis.  She was sent  for vascular surgery consultation.  The patient is unaware of any  previous history of DVT or phlebitis.  She has had varicose veins in  both lower extremities for as long as she can remember.  She states that  when she was in her 60s she had her saphenous veins stripped from both  sides.  Her recent episode of left leg pain was treated with oral  antibiotics and elevation and the inflammation and redness have improved  markedly with this treatment.   PAST MEDICAL HISTORY:  Significant for breast cancer.  She has undergone  lumpectomy and radiation therapy.  She also had a melanoma removed from  her left thigh.  It is our understanding that both of these cancers were  successfully treated.  She denies any history of diabetes, hypertension,  hypercholesterolemia, history of previous myocardial infarction or  history of congestive heart failure.   FAMILY HISTORY:  There is no history of premature cardiovascular  disease.   SOCIAL HISTORY:  She is widowed.  She has five children.  She quit  tobacco in 1976.   REVIEW OF SYSTEMS AND MEDICATIONS:  Are documented on the medical  history form in the chart.   PHYSICAL EXAMINATION:  General:  This is a pleasant 77 year old woman  who appears her stated age.  Vital signs:  Blood pressure is 143/71,  heart rate is 70.  Neck:  Neck is supple.  There is no  cervical  lymphadenopathy.  I do not detect any carotid bruits.  Lungs:  Are clear  bilaterally to auscultation.  Cardiac:  She has a regular rate and  rhythm.  Abdomen:  Her abdomen is soft and nontender.  I cannot palpate  any masses.  She has normal pitched bowel sounds.  Extremities:  She has  palpable femoral and popliteal pulses bilaterally.  She has palpable  dorsalis pedis pulses bilaterally.  I cannot palpate posterior tibial  pulses.  She has mild swelling of both lower extremities and significant  varicosities of both lower extremities.  She does have some mild  induration on the medial aspect of her left thigh with minimal  cellulitis.  She has evidence of resolving phlebitis of the left leg.   Based on her exam she has evidence of significant chronic venous  insufficiency although on her duplex scan at [facility] I do  not see that they evaluated her for reflux in the deep system or in the  greater saphenous vein.   With respect to her phlebitis I have encouraged her to continue with  warm compresses, elevation and ibuprofen if needed.  I have also written  her a prescription for compression stockings with a mild gradient  although  she has problems in getting the compression stockings on  herself.  I do not want to give her too tight a stocking as I do not  think she would be able to get these on and this would not be practical.  We also discussed the importance of intermittent leg elevation.  I think  she could potentially be a candidate for surgical treatment of her  varicosities but she would need a formal reflux venous study in order to  determine if she is potentially a candidate for laser ablation of her  saphenous vein or segmental excision of her varicosities.  Although she  has had previous vein stripping it is certainly possible that she had a  duplicate saphenous vein on both sides.  However, currently with her  resolving phlebitis this would not be the  ideal time to proceed with  this so will have her follow up with Dr. Kellie Simmering or Dr. Donnetta Hutching in 3 months  and obtain a formal venous study at that time.  I have also given her a  prescription for compression stockings.  If her symptoms worsen we would  be happy to see her sooner.   Judeth Cornfield. Scot Dock, M.D.  Electronically Signed  CSD/MEDQ  D:  02/01/2008  T:  02/02/2008  Job:  [id number]   cc:   Sherrilee Gilles. Gerarda Fraction, MD

## 2010-12-20 NOTE — H&P (Signed)
[facility]  Patient:    [name] [name], [name] [name]                     MRN: [id number] Adm. Date:  04/17/00 Attending:  Philips J. Eulas Post, M.D. Dictator:   Elodia Florence. Clabe Seal, P.A. CC:         [name], M.D., [facility]   History and Physical  DATE OF BIRTH:  25-Dec-1922.  CHIEF COMPLAINT:  "Pain in my left hip."  HISTORY OF PRESENT ILLNESS:  This is a 77 year old lady who had been seen by Dr. Purnell Shoemaker. Eulas Post for increasing problems concerning her hips.  She is having more problems in the left hip than the right.  We have tried to treat her in the past with ______ anti-inflammatories but she had a bleeding ulcer even with COX-2 inhibitor, Celebrex.  This is a deteriorating condition.  She has had limitation of motion and catching with range of motion.  She has significant offset of the joint with bone-on-bone contact and spur formation. Due to these positive findings, it felt this patient would benefit from surgical intervention so she can maintain her active lifestyle.  She has an excellent family support unit, so she should do well postoperatively.  She has been cleared preoperatively by Dr. Satira Anis. McGough.  PAST MEDICAL HISTORY:  The patient had appendectomy in 1942, tonsillectomy in 1941, tubal ligation in 1952, partial hysterectomy with bladder tack in 1976, bilateral breast tumors in the past, gallbladder removed in 1998, biopsy of the breast which turned out to be cancer in 1998, precancerous polyp removed from the gut in July of 2001.  She has a history of bladder infections and "bleeding ulcer."  ALLERGIES:  She is allergic to PENICILLIN, which causes a rash.  FAMILY PHYSICIANS:  Dr. Orson Ape and the rest of the doctors at [facility].  CURRENT MEDICATIONS  Centrum vitamins, hydrocodone for pain and occasional Tylenol.  FAMILY HISTORY:  The family history is positive for cancer,  arthritis and asthma.  SOCIAL HISTORY:  The patient neither smokes nor drinks.  REVIEW OF SYSTEMS:  CNS:  No seizure disorder, paralysis, numbness or double vision.  RESPIRATORY:  No productive cough.  No hemoptysis.  No shortness of breath.  CARDIOVASCULAR:  No chest pain.  No angina.  No orthopnea. GASTROINTESTINAL:  No nausea, vomiting, melena or bloody stools. GENITOURINARY:  No discharge, dysuria or hematuria.  MUSCULOSKELETAL: Primarily as in present illness with her hips.  PHYSICAL EXAMINATION  GENERAL:  Alert and cooperative, friendly 77 year old white female.  VITAL SIGNS:  Blood pressure 140/78.  Pulse 76.  Respirations are 12.  HEENT:  Normocephalic.  PERRLA.  EOM intact.  Oropharynx is clear.  CHEST:  Clear to auscultation.  No rhonchi or rales.  HEART:  Regular rate and rhythm.  No murmurs are heard.  ABDOMEN:  Soft, nontender.  Liver and spleen not felt.  GENITALIA/PELVIC:  Not done, not pertinent to present illness.  RECTAL:  Not done, not pertinent to present illness.  BREASTS:  Not done, not pertinent to present illness.  EXTREMITIES:  Left hip as in present illness above.  ADMITTING DIAGNOSIS:  Severe osteoarthritis, left hip.  PLAN:  The patient will be admitted for total hip replacement arthroplasty to the left hip.  She has donated no blood for this procedure.  We will certain ask Dr. Orson Ape, et al, to follow along with [place] during this patients hospitalization.  At this  point in time, we have not decided whether she will be home with physical therapy or need rehab; this will be determined depending on her hospital progress. DD:  04/09/00 TD:  04/09/00 Job: [id number]

## 2010-12-20 NOTE — Op Note (Signed)
NAMEMAELENE, BANKEY NO.:  [id number]   MEDICAL RECORD NO.:  [id number]          PATIENT TYPE:  INP   LOCATION:  A301                          FACILITY:  [facility]   PHYSICIAN:  Carole Civil, M.D.DATE OF BIRTH:  12-Nov-1922   DATE OF PROCEDURE:  05/19/2006  DATE OF DISCHARGE:                                 OPERATIVE REPORT   PREOPERATIVE DIAGNOSIS:  Osteoarthritis, right knee.   POSTOPERATIVE DIAGNOSIS:  Osteoarthritis, right knee.   PROCEDURE:  Right total knee arthroplasty.   IMPLANTS:  DePuy fixed bearing posterior-stabilized total knee replacement,  size 4 tibia, size 3 femur, 15 polyethylene posterior-stabilized insert,  size 35 patella.   One Hemovac drain was placed in the joint.  One pain pump catheter was  placed subcutaneously.   ASSISTANTS:  Simonne Maffucci and Cherly Hensen.   SURGEON:  Carole Civil, MD   TOURNIQUET TIME:  1 hour 35 minutes.   FINDINGS:  Complete loss of cartilage on the medial compartment with varus  deformity.  There were degenerative changes laterally as well.  The  patellofemoral joint contained grade 4 changes also.  There was mild varus  deformity.   HISTORY:  An 77 year old female, end-stage osteoarthritis on x-ray, combined  with pain and dysfunction of the joint.  We followed her for several months  and her pain progressed and did not improve with nonoperative therapy.   This patient was identified in the preop holding area as Andreas Newport.  Her  right knee was marked for surgery.  I countersigned it. I then tied updated  her history and physical.  Antibiotics were started.  She was taken to the  operating room for a spinal anesthetic, which was successful.  She was  placed supine on the operating table and a Foley catheter was placed.  A  tourniquet was applied to her right thigh.  After sterile prep and drape of  the right lower extremity.  The time-out procedure was completed.  The  procedure was  confirmed as right total knee replacement on [employer].  Radiographs were available for review as well.   With the tourniquet elevated after exsanguination of the limb, the knee was  flexed and a straight incision was made centered over the patella.  This  incision was carried down through subcutaneous tissue, the extensor  mechanism was identified, and a medial arthrotomy was performed.  The  patella was everted laterally.  Medial and lateral menisci were resected  along with the PCL and ACL.  Fat pad was removed.  Medial soft tissue sleeve  was elevated to the midportion of the tibia.  Tibia was subluxated forward  and a tibial guide was placed with the knee in full flexion.  The guide was  set for a neutral cut.  We referenced the lateral side, which was a the  higher side, and took off 10 mm of bone using an oscillating saw.  We  measured the tibia to a size 4.   We then introduced a drill into the center of the femoral canal.  We  irrigated and suctioned  the canal and then placed a distal cutting block set  for 11, 5 degree right distal resection.  After removing 11 mm of bone an  extensor block was placed and in it fit easily with a 10 and a 12.5.  we  were satisfied with that and flexed the knee up, sized the knee to a size 3  femur.  With external rotation we set the block and made the four cuts.  We  then trialled the flexion gap with 12.5, 15 and 17 mm spacer blocks.  The 15  block fit best in flexion/extension, maintaining stability of the knee.   We then cut the box cut, cut the patella from a 23 down to with 13, sized it  to a 35, made the three peg hole cuts, and then did a full trial with 15 and  12.5 spacer blocks.  The 15 gave the best stability in flexion while  maintaining full extension.   We then punched the tibia using the previously-noted rotation on  flexion/extension.  We irrigated the knee, dried the bone, cemented the  implants in place.  We we  re-trialled with 15 and 17 blocks and liked the 15  best.  We placed a 15 polyethylene insert.  We were able obtain full  extension if not 2 degrees of hyperextension and full flexion of the knee to  125 degrees with no impingement or instability at 90 degrees.   We then irrigated and removed any excess cement and closed over a suction  drain with #1 Bralon, 2-0 Monocryl and 0 Monocryl with a pain pump catheter  in the subcu and then staples.  Sterile dressings were applied.  Tourniquet  was released.  A Cryo/Cuff was placed.  The patient was taken to the  recovery room in stable condition for radiographs to be taken.  She will be  placed in a CPM.  She will be placed on Coumadin.  She is allergic to  CODEINE.  She was started on Dilaudid PCA and will use oral Dilaudid for  pain.      Carole Civil, M.D.  Electronically Signed     SEH/MEDQ  D:  05/19/2006  T:  05/20/2006  Job:  [id number]

## 2010-12-20 NOTE — Op Note (Signed)
[name] [name], [name] [name]                         ACCOUNT NO.:  [id number]   MEDICAL RECORD NO.:  [id number]                   PATIENT TYPE:  AMB   LOCATION:  DAY                                  FACILITY:  [facility]   PHYSICIAN:  Hildred Laser, M.D.                 DATE OF BIRTH:  Mar 16, 1923   DATE OF PROCEDURE:  03/05/2004  DATE OF DISCHARGE:                                 OPERATIVE REPORT   PROCEDURE PERFORMED:  Esophagogastroduodenoscopy followed by total  colonoscopy with polypectomy.   INDICATIONS FOR PROCEDURE:  Ms. Guenette is a 77 year old Caucasian female  who has chronic gastroesophageal reflux disease.  She is doing well in  therapy.  She is undergoing esophagogastroduodenoscopy to make sure she does  not have Barrett's esophagus.  She has a history of colonic polyps and  therefore undergoing surveillance colonoscopy.  She had diarrhea alternating  with constipation and is felt to have irritable bowel syndrome.  She is not  taking fiber supplement that had been recommended previously.   The procedure was reviewed with the patient and informed consent was  obtained.   PREOP MEDICATION:  Cetacaine spray for pharyngeal topical anesthesia.  Demerol 50 mg IV, Versed 5 mg IV in divided dose.   FINDINGS:  Procedures performed in endoscopy suite. The patient's vital  signs and oxygen saturations were monitored during the procedure and  remained stable.   PROCEDURE #1:  Esophagogastroduodenoscopy.  The patient was placed in left  lateral decubitus position and Olympus video scope was passed via oropharynx  without difficulty into the esophagus.   Esophagus.  Mucosa of the esophagus was normal throughout.  Squamocolumnar  junction was wavy.  No hernia was noted.  Stomach.  It was empty and distended very well with insufflation.  Folds of  proximal stomach are normal.  Examination of mucosa at antrum revealed  erythema and granularity but no erosions or ulcers were noted.  Pyloric  channel was patent.  Angularis, fundus and cardia were examined by  retroflexion of the scope and were normal.  Duodenum examined to the bulb and revealed a patchy erythema and edema with  some friability but no erosions or ulcers are noted.  Scope was passed to  the second part of the duodenum where the mucosa and folds were normal.  Endoscope withdrawn and patient prepared for procedure #2.   PROCEDURE #2:  Colonoscopy.  A rectal examination was performed.  She had  small soft anal skin tags.  Digital exam was normal.  Scope was placed in  the rectum and advanced  under vision into the sigmoid colon beyond.  She  had multiple diverticula in sigmoid and descending colon and scattered above  down all the way to the cecum.  Scope was advanced to the cecum which was  identified by appendiceal orifice and ileocecal valve which was quite  prominent.  There was a small polyp at ascending  colon just above the valve  which was ablated by a cold biopsy.  There was another large polyp 16 to 18  mm on a very short stalk at sigmoid colon.  This was snared and retrieved  for histologic examination.  The mucosa of the distal sigmoid colon and  rectum was examined and was normal.  The scope was retroflexed to examine  the anorectal junction and small hemorrhoids were noted.  Hemorrhoids were  noted below the dentate line.  Endoscope straightened and withdrawn.  The  patient tolerated the procedure well.   FINAL DIAGNOSES:  1. Nonerosive antral gastritis and bulbar duodenitis.  This may be due to     NSAID use but need to rule out Helicobacter pylori infection.  2. No evidence of Barrett's esophagus.  3. Pancolonic diverticulosis.  4. Large polyp snared from the sigmoid colon and a small polyp at ascending     colon was ablated by cold biopsy.  5. Small external hemorrhoids.   RECOMMENDATIONS:  1. Standard instructions given.  She will continue antireflux measures and     Nexium as before.  2. She  is advised to stay on high fiber diet and Metamucil or Citrucel one     tablespoonful daily.   I will be contacting the patient with biopsy results and further  recommendations.      ___________________________________________                                            Hildred Laser, M.D.   NR/MEDQ  D:  03/05/2004  T:  03/05/2004  Job:  [id number]   cc:   [name], M.D.  [address] [id number]  Fax: [phone]

## 2010-12-20 NOTE — H&P (Signed)
   NAMEENSLIE, SHANHOLTZ                         ACCOUNT NO.:  [id number]   MEDICAL RECORD NO.:  [id number]                   PATIENT TYPE:  INP   LOCATION:  A327                                 FACILITY:  [facility]   PHYSICIAN:  [name], M.D.            DATE OF BIRTH:  09-11-1922   DATE OF ADMISSION:  03/23/2003  DATE OF DISCHARGE:                                HISTORY & PHYSICAL   CHIEF COMPLAINT:  Weakness with abdominal pain.   PRESENTING ILLNESS:  This is a 77 year old white female in relatively good  health until the day of admission began feeling extremely weak with some  lower abdominal pain.  Presented to the emergency room, was found to have a  temperature of 101.7 and several white cells in her urine.  It was felt she  could possibly have urosepsis and, due to her age and living alone, she was  admitted for IV antibiotics and further evaluation.   PAST MEDICAL HISTORY:  She is allergic to PENICILLIN.  Current medications  include 5 mg of diazepam at bedtime, iron, Dyazide 25/37.5, Nexium,  Celebrex, Phenergan p.r.n.   CURRENT DIAGNOSES:  1. Osteoarthritis.  2. Gastroesophageal reflux disease.  3. She is status post cholecystectomy, appendectomy, and hysterectomy.   REVIEW OF SYSTEMS:  Denies chest pain, nausea, vomiting, or constipation or  diarrhea.   PHYSICAL EXAMINATION:  VITAL SIGNS:  Temperature 101.7, blood pressure  110/45, heart rate 93, respirations 20 and unlabored.  HEENT:  TMs normal.  Pupils equal and reactive to light and accommodation.  Oropharynx benign.  NECK:  Supple.  Without JVD, bruit, thyromegaly.  LUNGS:  Clear in all areas.  HEART:  With a regular sinus rhythm.  Without murmur, gallop, or rub.  ABDOMEN:  Soft, with some mild lower abdominal tenderness.  No rebound.  EXTREMITIES:  Without clubbing, cyanosis, or edema.  NEUROLOGIC:  Grossly intact.   ASSESSMENT:  Possible urosepsis.     [name], M.D.    WMM/MEDQ  D:  03/24/2003  T:  03/24/2003  Job:  [id number]

## 2010-12-20 NOTE — Discharge Summary (Signed)
[facility]  Patient:    [name] [name], [name] [name]                    MRN: [id number] Adm. Date:  [id number] Disc. Date: 05/15/00 Attending:  Eulas Post, Philips John Dictator:   Jenetta Loges, P.A.-C. CC:         Dr. Orson Ape in [place]   Discharge Summary  ADMISSION DIAGNOSES: 1. End-stage osteoarthritis of the left hip. 2. History of breasts cancer. 3. Remote history of gastrointestinal ulcer. 4. History of precancerous colonic polyps.  DISCHARGE DIAGNOSES: 1. End-stage osteoarthritis of the left hip. 2. History of breasts cancer. 3. Remote history of gastrointestinal ulcer. 4. History of precancerous colonic polyps. 5. Status post left total hip arthroplasty. 6. Mild postoperative hemorrhagic anemia, that required transfusion. 7. Postoperative nausea.  OPERATIVE PROCEDURE:  Left total hip arthroplasty.  BRIEF HISTORY:  This is a 77 year old female who has been followed in our practice for some time for left end-stage osteoarthritis.  She has failed conservative measures and at this time, has bone-on-bone formation and is significant effecting activities of daily living.  She is quite painful, and at this time, wishes to proceed with left total hip arthroplasty.  Risks and benefits were discussed with the patient and she is in agreement, and wishes to proceed.   Surgeon was [employer]. Eulas Post, M.D. and assistant was Kipp Brood. Gioffre, M.D. under general anesthesia  HOSPITAL COURSE:  The patient was admitted and underwent the above mentioned procedure, and tolerated this well.  All appropriate IV antibiotics and analgesia were provided.  Postoperatively, she was placed on total hip replacement protocol, touch down weightbearing to the opposite extremity.  She was also placed on Coumadin for DVT and PE prophylaxis.  The patient followed the usual postoperative course where IV analgesics were weaned to p.o. medications.  She did experience some  postoperative nausea.  GI meds were readjusted to help coverage.  A rehab consult was ordered and the patient was felt to require further inpatient stay as she was slow with physical therapy.  By postoperative day #3, the patient was still having some mild nausea.  She was afebrile.  Hemoglobin was stable at 9.5.  She was neurovascularly intact. The incision had some scant serous drainage, however, there were no signs of infection.  She was tolerating distances in ambulation of 20 feet, minimal assist, and required minimal assist for transfers.  A SACU bed was found and this was felt to be appropriate.  At this time, the patient was stable for discharge to the [facility] level at [place]:  Laboratory values shows admission hemoglobin of 11.0, postoperatively on day #1, 10.5, 9.9, and 9.2.  The patient did receive 2 units intraoperatively.  Pro times and PTT followed in the chart.  INR by the pharmacy on Coumadin for DVT and PE prophylaxis.  Chemistries on admission within normal limits.  Urinalysis on admission showed small leukocyte esterase, few epithelials, and bacteria.  Two units _______ transfused.  RADIOLOGY:  Shows normal postoperative hip film on date May 12, 2000. Preoperative chest film showed no active disease on April 09, 2000, and preoperative hip films on the left hip showed severe degenerative changes of the left hip.  Cardiology shows normal sinus rhythm and occasional PVCs.  CONDITION ON DISCHARGE:  Stable and improved.  DISCHARGE MEDICATIONS AND PLAN:  The patient is being discharged to [facility] level care under the physicians advisement there.  They will monitor all  of her medications there.  Continue touch down weightbearing, stay off of extremity.  Daily dressing changes.  Staples out in two weeks postoperatively. Will continue to follow her p.r.n. there in SACU.  I have added Reglan 10 mg q.a.c. and h.s. as well as Protonix 40 mg daily  to her regimen to help with mild nausea.  She is on Colace 100 mg b.i.d., Trinsicon one t.i.d., Percocet one to two p.r.n. pain q.4-6h., Coumadin per pharmacy, EOC and LOC, Reglan 10 mg q.a.c. and h.s., Protonix 40 mg q.d., and Skelaxin 400-800 mg q.6-8h. p.r.n. spasm, Restoril 15 mg p.r.n. sleep. DD:  05/15/00 TD:  05/15/00 Job: [id number]

## 2010-12-20 NOTE — H&P (Signed)
[name] [name], [name] [name]                         ACCOUNT NO.:  [id number]   MEDICAL RECORD NO.:  [id number]                   PATIENT TYPE:  AMB   LOCATION:  DAY                                  FACILITY:  [facility]   PHYSICIAN:  Les Pou, N.P.           DATE OF BIRTH:  10/27/1922   DATE OF ADMISSION:  02/13/2004  DATE OF DISCHARGE:  02/13/2004                                HISTORY & PHYSICAL   CHIEF COMPLAINT:  Colonoscopy.  History of adenomatous polyps.   HISTORY OF PRESENT ILLNESS:  [name] [name] is a 77 year old Caucasian female  with history of tubular adenomatous polyp on the stock of the mid ascending  colon with polypectomy February 25, 2000 by Dr. Gala Romney.  She was also found to  have internal hemorrhoids and a massive densely populated left-sided  diverticula.  She was due for followup colonoscopy in 2004.  However, she  had hip replacement and has postponed until now.  She has done well other  than hip replacement in February 2004.  She does have occasional mid to  lower abdominal cramping which is intermittent mostly on the right side and  is associated with loose stools occasionally, however, infrequently.  She  typically has a bowel movement every day or up to three times per day as  well as ranging between every other day.  She uses stool softeners and Milk  of Magnesia on a p.r.n. basis.  She denies any rectal bleeding or melena or  mucousy stools.  She does have history of chronic gastroesophageal reflux  disease complicated by peptic ulcer disease about five years ago with EGD by  Dr. Allean Found.  She was H. pylori positive and treated in April 2001.  She denies  any current heartburn or indigestion as long as she takes her Nexium 40 mg  daily.  She denies any dysphagia or odynophagia.   PAST MEDICAL HISTORY:  Last colonoscopy as described in HPI by Dr. Gala Romney in  2001.  Diverticulosis, osteoarthritis, left hip replacement x2; last being  in February 2004.  Tendonitis of the  left hip, peptic ulcer disease about  five years ago, H. pylori positive status post triple-drug therapy in April  2001, tubal ligation in 1952, bladder sling, partial hysterectomy, varicose  vein surgery, breast carcinoma status post lumpectomy and radiation in 1999,  left eye cataract surgery.   CURRENT MEDICATIONS:  1. Nexium 40 mg daily.  2. Celebrex 200 mg t.i.d.  3. Cyclobenzaprine 10 mg p.r.n.   ALLERGIES:  1. PENICILLIN, rash.  2. CODEINE, nausea and vomiting.   FAMILY HISTORY:  No known family history of colorectal carcinoma or other  chronic GI problems.  Mother deceased at age 46 secondary to asthma.  Father  deceased at age 8 secondary to cerebrovascular accident.  She has two  healthy sisters and one health brother.   SOCIAL HISTORY:  [name] [name] lives alone.  She is currently a widow.  She  has five healthy grandchildren.  She is currently retired.  She reports  remote tobacco abuse.  Denies any alcohol or drug use.   REVIEW OF SYSTEMS:  CONSTITUTIONAL:  Weight is steadily increasing due to  her immobility.  Appetite is okay.  She is complaining of occasional  fatigue.  Denies any fever or chills.  CARDIOVASCULAR:  Denies any chest  pain or palpitation.  PULMONARY:  Denies any shortness of breath, dyspnea,  cough or hemoptysis.  GI:  See HPI.   PHYSICAL EXAMINATION:  VITAL SIGNS:  Weight 215 pounds, temperature 97.1,  blood pressure 126/60, pulse 74.  GENERAL:  [name] [name] is a 77 year old Caucasian female who is obese and  ambulates with walker.  She is accompanied by her daughter today.  She is  alert, oriented and pleasant, cooperative.  HEENT:  Sclerae are clear and nonicteric.  Conjunctivae pink.  Oropharynx  pink and moist.  She does have upper and lower dentures intact.  NECK:  Supple without any masses or thyromegaly.  CHEST:  Heart regular rate and rhythm with normal S1, S2 without any  murmurs, rubs or gallops.  LUNGS:  Clear to auscultation  bilaterally.  ABDOMEN:  Protuberant with positive bowel sounds.  __________auscultated.  She does have about 1 cm round right lower quadrant appears to be large  spider angioma.  Soft, nontender, nondistended without palpable masses or  hepatosplenomegaly.  No rebound tenderness or guarding.  RECTAL:  Deferred.  EXTREMITIES:  Good pedal pulses bilaterally.  No edema.  SKIN:  Pale, warm and dry with multiple varicosities of bilateral lower  extremities.  No edema.   ASSESSMENT:  [name] [name] is a 76 year old Caucasian female with history of  tubular adenoma in the mid descending colon approximately 5 mm.  She is due  for followup colonoscopy.  Will schedule this.  She is requesting due to her  significant left hip pain that she require heavy sedation and I will discuss  this further with Dr. Melony Overly prior to colonoscopy.  As far as her chronic  gastroesophageal reflux disease is considered she believes she may have had  EGD over five years ago when she had history of peptic ulcer disease.  However, she would like screening exam given the fact that she has had  chronic gastroesophageal reflux disease for at least 10 years now to look  for  changes, complications of chronic reflux including Barrett's esophagus.  This can be scheduled at the same time.   RECOMMENDATIONS:  1. Will schedule colonoscopy and EGD with Dr. Melony Overly in the near future.  I     discussed both procedures including risks and benefits which include     bleeding, infection, perforation and drug reaction.  She agrees with the     plan.  Consent will be obtained.  2. She is to continue Nexium 40 mg daily.  3. Further recommendations pending colonoscopy.     ___________________________________________                                         Les Pou, N.P.   KC/MEDQ  D:  02/21/2004  T:  02/21/2004  Job:  [id number]   cc:   [name], M.D.  [address] [place] [id number]  Fax: [phone]

## 2010-12-20 NOTE — H&P (Signed)
NAMENANDIKA, LANDAVERDE NO.:  [id number]   MEDICAL RECORD NO.:  [id number]          PATIENT TYPE:  AMB   LOCATION:  DAY                           FACILITY:  [facility]   PHYSICIAN:  Carole Civil, M.D.DATE OF BIRTH:  1923/03/05   DATE OF ADMISSION:  DATE OF DISCHARGE:  LH                                HISTORY & PHYSICAL   CHIEF COMPLAINT:  Right knee pain.   HISTORY:  This is an 77 year old female with a long history of pain in her  right knee, status post left total hip replacement with degenerative disk  disease of the lumbar spine.  Presents on this occasion for right total knee  replacement secondary to pain which failed to respond to conservative  treatment which included Synvisc injections, analgesics and other  nonoperative measures.   She complained of severe constant dull aching right knee pain for greater  than two years.   REVIEW OF SYSTEMS:  History of fatigue, poor circulation, shortness of  breath, reflux, joint pain, joint swelling, poor vision, cataracts, poor  hearing, sinusitis, sinus problem, bee sting allergy.  She denies kidney  transplant, kidney stone, headache dizziness, migraine, thyroid disease,  goiter, depression, mood swing, eczema.   ALLERGIES:  PENICILLIN, CODEINE.   PAST SURGICAL HISTORY:  Partial hysterectomy, left hip replacement,  appendicitis, cataracts, breast cancer surgery.   History of clotting lower extremities after hip surgery.   She had loosening of her first hip, was revised by Dr. Wynelle Link in 2004.   MEDICATIONS:  Nexium.   FAMILY HISTORY:  Arthritis, cancer, asthma.   FAMILY PHYSICIAN:  [name], M.D.   Patient is widowed, retired, does not smoke or drink.  Caffeine uses two  cups a day.  Grades completed 1 through 6.   PHYSICAL EXAMINATION:  VITAL SIGNS:  Weight 200, pulse 84, respiratory rate  18.  GENERAL APPEARANCE:  Normal development, grooming, hygiene, nutrition.  Body  habitus  mesomorphic.  PSYCHIATRIC:  She is alert and oriented x3, mood and affect normal.  Sensation remains intact in both lower extremities.  CARDIOVASCULAR:  Normal peripheral pulses, mild bilateral leg edema and some  mild evidence of venous stasis disease with skin changes.  Skin is otherwise  normal in all four extremities.  CHEST:  Clear.  HEART:  Rate and rhythm normal.  ABDOMEN:  Soft, no masses.  EXTREMITIES:  Right knee examination shows approximately 125 degree flexion,  5 degree flexion contracture, normal strength.  All ligaments were stable.  The medial joint line was tender and there was varus deformity.  Upper  extremities normal range of motion, strength stability and alignment.   Radiographs show varus osteoarthritis of the right knee.   PLAN:  Right total knee replacement with a Depuy rotating platform versus  fixed for osteoarthritis of the right knee.   The informed consent process has been completed.  The patient understands  her diagnosis, treatment planned, alternatives, risk:benefit ratio.  She has  been allowed to ask questions and those questions were answered.      Carole Civil, M.D.  Electronically Signed  SEH/MEDQ  D:  05/18/2006  T:  05/19/2006  Job:  [id number]

## 2010-12-20 NOTE — Op Note (Signed)
[facility]  Patient:    [name] [name], [name] [name]                    MRN: [id number] Proc. Date: 05/12/00 Adm. Date:  [id number] Attending:  Eulas Post, Philips John                           Operative Report  PREOPERATIVE DIAGNOSIS:  Degenerative arthritis left hip.  POSTOPERATIVE DIAGNOSIS:  Degenerative arthritis left hip.  OPERATIVE PROCEDURE:  Left total hip replacement arthroplasty using an Osteonics cemented femur, size 8 which was the ion plus cemented hip which is an ODC hip with a smaller neck and 127 degree angle and the ion which was the thinner neck and less likely to abut or dislocate as the [place].  The acetabular shell was a microstructured Omnifit PSL size 52, and we used one cancellous bone screw size 30 to add additional fixation.  A 10 degree Omnifit series 2 cup insert was done, and that is with the new [place] polyethylene which is called an Osteonics series 2.  Distal cement spacer of 12 mm, a size 4 cement plug which was 15.5 mm, and there was a +5 mm C-tapered head.  SURGEON:  Philips J. Eulas Post, M.D.  ASSISTANT:  Kipp Brood. Gioffre, M.D.  DESCRIPTION OF PROCEDURE:  After suitable general anesthesia, she was positioned right lateral decubitus and the left hip prepped and draped routinely.  A modified osteomyel approach was utilized with the femur attachment to the gluteus maximus being released and a Charnley retractor used.  The external rotators were released and tagged and posterior capsulectomy carried out.  Considerable bleeding in the soft tissues throughout.  Dislocation of the head and amputation of the head followed by reaming of the femoral canal to accept the size 8 prosthesis.  We then did an anterior capsulectomy and surgeon and the assistant changed places, and the cup was reamed to 52.  Trial reductions revealed we were a little bit too much varus of the cups ideal position, but the anteversion looked good and lined up  directly at the notch, 25 degrees maybe anteversion.  The trial cup was removed, and we reamed a little more vertical, and a second trial reduction revealed this to be perfect coverage in neutral with good stability in the usual parameters of hyperextension and external rotation, flexion, slight internal rotation and adduction.  We then inserted the acetabular shell which was the Omnifit PSL microstructure size 52 and fixed it with one screw.  A 10 degree cup was inserted, and a second trial reduction carried out with the real cup in place which confirmed what we had done on the last trial reduction.  Change of places again between the surgeon and assistant, and the femoral shaft was prepared after putting in a size 4 glove with the usual brushing and irrigation followed by trimming the sponges.  After the cement had been mixed and vacuumed, it was inserted by way of a gun distally to proximal, pressurized, and then the implant inserted and held until set. Final reduction with a trial +5 head confirmed excellent.  The trial head was removed, and the +5 C-tapered head applied.  Routine wound closure then. Significant osteophytes were removed about the acetabulum prior to closure. The wound was closed routinely over one Hemovac.  She lost about three units of blood, and there was a problem in that there was about a 50 minute delay  in getting the blood up, but she did receive two units and was stable on leaving the operating room and went to recovery in good condition. DD:  05/12/00 TD:  05/13/00 Job: [id number]

## 2010-12-20 NOTE — Op Note (Signed)
[name], [name] [name]               ACCOUNT NO.:  [id number]   MEDICAL RECORD NO.:  [id number]          PATIENT TYPE:  AMB   LOCATION:  DAY                           FACILITY:  [facility]   PHYSICIAN:  R. Garfield Cornea, M.D. DATE OF BIRTH:  08-11-1922   DATE OF PROCEDURE:  10/17/2005  DATE OF DISCHARGE:                                 OPERATIVE REPORT   PROCEDURE:  Diagnostic colonoscopy followed by an  esophagogastroduodenoscopy.   ENDOSCOPIST:  Cristopher Estimable. Rourk, M.D.   INDICATIONS FOR PROCEDURE:  The patient is an 77 year old lady with a  history of complicated peptic ulcer disease, history of high-grade adenoma,  carcinoma in situ, and a polyp removed in 2005.  She has a 1-year history of  intermittent rectal bleeding. Sometimes she has dark stools as well. She  does take Celebrex with concomitant Nexium.  Colonoscopy followed by  possible EGD now being done.  This approach has been discussed with the  patient at length.  The potential risks, benefits, and alternatives have  been reviewed; questions answered.  She is agreeable.  Please see the  documentation in the medical record. From last month she had a hemoglobin in  the 10 range with a low-normal MCV of 80.   PROCEDURE NOTE:  O2 saturation, blood pressure, pulse and respirations were  monitored throughout the entirety of both procedures.   CONSCIOUS SEDATION:  Versed 6 mg IV, Demerol 100 mg IV in divided doses.   INSTRUMENT:  Olympus video chip system.   FINDINGS:  Digital rectal exam revealed no abnormalities.   ENDOSCOPIC FINDINGS:  The prep was adequate.   RECTUM:  Examination of the rectal mucosa including the retroflex view of  the anal verge revealed only some internal hemorrhoids.   COLON:  The colonic mucosa was surveyed from the rectosigmoid junction  through the left transverse and right colon to the area of the appendiceal  orifice, ileocecal valve, and cecum.  These structures were well seen and  photographed  for the record.   From this level the scope was slowly withdrawn.  All previously mentioned  mucosal surfaces were again seen.  The patient has sigmoid diverticula.  The  remainder of the colonic mucosa appeared normal.   The patient was then prepared for EGD.  Cetacaine spray was used for topical  oropharyngeal anesthesia.   FINDINGS:  Examination of the tubular esophagus revealed no mucosal  abnormalities.  The EG junction was easily traversed.   STOMACH:  The gastric cavity was empty.  It insufflated well with air.  A  thorough examination of the gastric mucosa including a retroflex view of the  proximal stomach and esophagogastric junction demonstrated only a small  hiatal hernia.  The pylorus was patent and easily traversed.  Examination of  the bulb and second portion revealed no abnormalities.   THERAPEUTIC/DIAGNOSTIC MANEUVERS PERFORMED:  None.   The patient tolerated the procedure well and was reacted in endoscopy.   COLONOSCOPY IMPRESSION:  1.  Internal hemorrhoids otherwise normal rectum.  2.  Sigmoid diverticula the remainder of the colonic mucosa appeared normal.  EGD FINDINGS:  1.  Normal esophagus.  2.  Small hiatal hernia, otherwise normal gastric mucosa.  3.  Patent pylorus.  4.  Normal D1 and D2.   The patient is anemic with a low-normal MCV. Hematochezia most likely, low  volume is secondary to hemorrhoids.  I am not sure that the hematochezia  explains her degree of anemia entirely.  Her stomach looks better than that  described when Dr. Laural Golden looked previously.   RECOMMENDATIONS:  1.  If she is to continue Celebrex she is to continue Nexium concomitantly.  2.  We will repeat the CBC today.  3.  Diverticulosis literature given to Ms. Chrissie Noa.  4.  She should take a daily fiber supplement.  5.  A 10-day of Anusol HC suppositories as directed.  6.  I will plan for her to see Dr. Laural Golden back in the office in 3 months.      Bridgette Habermann, M.D.   Electronically Signed     RMR/MEDQ  D:  10/16/2005  T:  10/17/2005  Job:  [id number]   cc:   [name], M.D.  Fax: [phone]

## 2010-12-20 NOTE — Discharge Summary (Signed)
[name] [name], [name] [name]                         ACCOUNT NO.:  [id number]   MEDICAL RECORD NO.:  [id number]                   PATIENT TYPE:  INP   LOCATION:  [id number]                                 FACILITY:  [facility]   PHYSICIAN:  Laurence Slate, M.D.                DATE OF BIRTH:  June 28, 1923   DATE OF ADMISSION:  09/27/2002  DATE OF DISCHARGE:  10/04/2002                                 DISCHARGE SUMMARY   ADMISSION DIAGNOSES:  1. Loosening of left femoral stem.  2. History of gastric ulcer.  3. Diverticulitis.   DISCHARGE DIAGNOSES:  1. Loosening of left femoral stem status post left total hip revision.  2. History of gastric ulcer.  3. Diverticulitis.   PROCEDURE PERFORMED:  The patient was taken to the operating room on  09/27/2002 and underwent revision of her right total hip replacement.  Surgeon is Dr. Laurence Slate.  Assistant was Dr. Gaynelle Arabian.  Surgery  was done under general anesthesia.  A Hemovac drain x1 was placed at the  time of surgery.   CONSULTATIONS:  1. Physical medicine and rehabilitation by Dr. Theda Sers.  2. Social work case Mudlogger.  3. Physical therapy.  4. Occupational therapy.   BRIEF HISTORY:  The patient is a 77 year old female status post left total  hip arthroplasty in 2001.  She has since been complaining of increasing pain  into the thigh.  She has also been complaining of difficulty to walk.  She  has now gotten to the point where the pain is kind of unbearable for her and  she wishes to have something definitively done.  The risks and benefits of  the procedure were discussed the patient and the patient wished to proceed.   LABORATORY DATA:  CBC on admission showed hemoglobin of 9.0 and hematocrit  28.0, white blood cell count 6.7, red blood cell count 3.93; serial H&H's  were followed throughout hospital stay; hemoglobin and hematocrit did  decline to 8.2 and 25.1 on 09/29/2002; however, hemoglobin was 9.9 and 29.4  on 10/04/2002 and  stable at the time of discharge.  Differential on  admission was all within normal limits.  ESR on admission was 38 which was  high.  PT/INR on admission also within normal limits.  PT/INR at 21.9 and  2.2, respectively, at the time of discharge.  Routine chemistry on admission  showed BUN high at 27.  Chemistries were followed throughout hospital stay.  Glucose peaked at 145 on 09/29/2002.  Sodium fell to 132 on 09/29/2002 but  was rising on 09/30/2002 at 134.  Urinalysis on admission showed amber  color, cloudy, specific gravity of 1.036 and trace amount of ketones, small  amount of leukocyte esterase, few epithelials.  Followup urinalysis showed  amber color, cloudy appearance, small bilirubin, trace ketones, 3 mg/dL of  protein, large leukocyte esterase, many epithelials and many bacteria.  The  patient's blood type is A  positive with antibody screen positive.  Gram  stain from left hip showed no organisms seen, rare white blood cells  present, predominantly mononuclear.  Culture of the left hip revealed rare  white blood cells, present predominantly PNM, no organisms seen.  EKG on  admission showed normal sinus rhythm with frequent premature  supraventricular complexes.  X-rays of the chest on 09/23/2002 revealed  stable mild cardiomegaly and COPD, no acute disease.  X-rays of the hip on  09/27/2002 revealed anatomic alignment status post left hip arthroplasty  revision, nondisplaced fracture of the outer cortex of the proximal femoral  metaphysis as well as the second cerclage wire.   HOSPITAL COURSE:  The patient was admitted to [facility] and taken  to the operating room.  She underwent the above-stated procedure without  complications.  The patient tolerated the procedure well and left the  recovery room to the orthopedic floor for continued postop care.  Postop day  #1 the patient was resting comfortably with no complaints, hemoglobin 9.4,  hematocrit 28.5, the patient  was seen by physical therapy and occupational  therapy, Hemovac was discontinued on this day.  On 09/29/2002 seen by  orthopedics  (postop day #2), the patient complained of some pain as  expected, hemoglobin had fallen to 8.2, hematocrit 25.1, the patient  continued with physical therapy/occupational therapy, touch-down  weightbearing, partial weightbearing okay, dressing was changed, PCA was  discontinued on this day.  On 09/30/2002 (postop day #3) seen by  orthopedics, T-max was 101, the patient was continued with physical  therapy/occupational therapy, hemoglobin 10.5, hematocrit 31.0, status post  2 units of packed red blood cells.  On 10/01/2002 seen by orthopedics  (postop day #4), T-max 100, complained of some pain in the left hip, the  patient was continued on physical therapy/occupational therapy, the patient  was hypokalemic, K-Dur 20 mEq was given b.i.d. x1.  On 10/02/2002 seen by  orthopedics, T-max 100.8, the patient was doing well, no complaints,  antibiotics and IV were discontinued on this day.  On 10/03/2002 seen by  orthopedics, the patient was doing well, slow with PT looking into skilled  nursing facility placement.  On 10/04/2002 ortho (postop day #7), the  patient was resting comfortably, was ready for discharge and will be  discharged to [facility] in [place].   DISPOSITION:  The patient was discharged to [facility] in [place] on 10/04/2002.   DISCHARGE MEDICATIONS:  1. Colace 100 mg one p.o. b.i.d.  2. Trinsicon one caplet p.o. three times daily.  3. Prilosec 20 mg one p.o. once daily.  4. Coumadin per pharmacy protocol.  5. Enema of choice one unit PR p.r.n.  6. Restoril 15-30 mg p.o. at bedtime p.r.n.  7. Reglan 10 mg p.o. q.8h. p.r.n.  8. Percocet one to two p.o. q.4-6h. p.r.n.  9. Phenergan 25 mg one p.o. q.6h. p.r.n. 10.      ______ 500 mg one p.o. q.6-8h. p.r.n.   DIET:  As tolerated.    ACTIVITY:  The patient can be touch-down to partial weightbearing as  tolerated.  Total hip precautions.   FOLLOW UP:  The patient is to follow up with Dr. Eulas Post 2 weeks from the day  of surgery.  She is to call the office for an appointment.   CONDITION ON DISCHARGE:  Stable and improved.     Pedro Earls, P.A.-C.  Laurence Slate, M.D.    SW/MEDQ  D:  10/04/2002  T:  10/04/2002  Job:  [id number]

## 2010-12-20 NOTE — Discharge Summary (Signed)
[facility]  Patient:    [name] [name], [name] [name]                    MRN: [id number] Adm. Date:  [id number] Disc. Date: 05/27/00 Attending:  Gilmore Laroche Dictator:   Lavon Paganini. [place], P.A. CC:         Philips J. Eulas Post, M.D.  Dr. Rexene Alberts, [place]   Discharge Summary  DISCHARGE DIAGNOSES: 1. Left total knee arthroplasty May 12, 2000. 2. Postoperative anemia. 3. Peptic ulcer disease. 4. Recurrent urinary tract infections.  HISTORY OF PRESENT ILLNESS:  [age]-year-old white female admitted to [facility] May 12, 2000, with chronic progressive left hip pain. No relief with conservative care.  X-rays showed advanced osteoarthritis. Underwent a left total knee arthroplasty May 12, 2000, per Dr. Laurence Slate.  Placed on Coumadin for deep vein thrombosis prophylaxis and weightbearing restrictions of touchdown weightbearing.  Postoperative anemia. Pain management.  No chest pain or shortness of breath.  Ambulating short distances with minimal assistance.  Latest laboratories showed an INR of 2.2. Hemoglobin 9.5.  Chemistries unremarkable.  She was admitted for comprehensive rehabilitation program.  PAST MEDICAL HISTORY:  See discharge diagnoses.  PAST SURGICAL HISTORY: 1. Appendectomy. 2. Tonsillectomy. 3. Tubal ligation. 4. Cholecystectomy. 5. Bilateral breast tumors.  ALLERGIES:  PENICILLIN, which caused a rash.  PRIMARY M.D.:  Dr. Orson Ape of [facility].  MEDICATIONS PRIOR TO ADMISSION:  Multivitamin and hydrocodone.  SOCIAL HISTORY:  Lives alone in [place].  Independent prior to admission. One-level home with three steps to entry.  Daughter works part-time.  Denies any alcohol or tobacco.  HOSPITAL COURSE:  The patient did well while on rehabilitation services with therapies initiated on a b.i.d. basis.  The following issues were followed during the patients rehabilitation course.  Pertaining to Ms.  [name] [name] left total knee arthroplasty, remained stable.  Surgical site healing nicely. Staples have been removed.  She was touchdown weightbearing with a walker. She continued on Coumadin for deep vein thrombosis prophylaxis.  Venous Doppler studies prior to her discharge were negative, and she will complete Coumadin protocol.  Postoperative anemia was stable, with latest hemoglobin of 9.1, hematocrit 27.6.  She continued on iron supplement.  She had a history of peptic ulcer disease.  She was on Protonix daily.  She had no nausea or vomiting.  No bowel or bladder disturbances.  Overall, for her functional mobility she was ambulating extended distances with a walker, essentially independent to standby assist in all areas of activities of daily living, in dressing, grooming, and homemaking.  Overall, her strength and endurance greatly improved as she was encouraged in her overall progress.  Discharged to home with home health physical and occupational therapy.  Latest chemistries showed an INR of 1.8.  Hemoglobin as noted above to be 9.1, hematocrit 27.6. All guaiac studies were negative.  Sodium 135, potassium 3.5, BUN 12, creatinine 0.8.  Urinalysis study was negative.  DISCHARGE MEDICATIONS: 1. Coumadin daily, with dose to be established at the time of discharge    x 10 more days. 2. Protonix 40 mg daily. 3. Trinsicon twice daily. 4. Tylox as needed for pain.  ACTIVITY:  Touchdown weightbearing with walker.  DIET:  Regular.  WOUND CARE:  Cleanse incision daily with warm water and soap.  SPECIAL INSTRUCTIONS:  No aspirin or ibuprofen while on Coumadin.  Home health physical and occupational therapy.  FOLLOW-UP:  With Dr. Orson Ape of [facility] for medical management. Dr. Laurence Slate, orthopedic  services. DD:  05/26/00 TD:  05/26/00 Job: [id number]

## 2010-12-20 NOTE — Discharge Summary (Signed)
   NAMEAARIELLE, [name] [name]                         ACCOUNT NO.:  [id number]   MEDICAL RECORD NO.:  [id number]                   PATIENT TYPE:  INP   LOCATION:  A327                                 FACILITY:  [facility]   PHYSICIAN:  [name], M.D.            DATE OF BIRTH:  [birth year]   DATE OF ADMISSION:  03/23/2003  DATE OF DISCHARGE:                                 DISCHARGE SUMMARY   DISCHARGE DIAGNOSIS:  Urinary tract infection.   HOSPITAL COURSE:  This 77 year old white female was admitted through the  emergency room after sudden onset of extreme weakness with some lower  abdominal pain.  In the emergency room she was found to have a temperature  of 101.7 with positive white cells in her urine and some mild tenderness in  her suprapubic area.  The patient's initial labs showed a white count of  11,000 with a moderate shift to the left.  Electrolytes showed BUN and  creatinine within normal range with BUN and creatinine being 13 and 0.9.  Again, she had 3-6 white cells in her urine and was nitrate negative, few  bacteria.  Cultures were ordered but the preliminaries are still pending at  this time.  Blood cultures are negative.  The patient was admitted to the  floor, begun on IV Levaquin.  She remained afebrile and feeling much better  throughout her stay.  The patient's abdominal pain has resolved and she is  tolerating a regular diet.  She will be discharged home on Cipro 250 b.i.d.  for a week as well as her previous medications which include Diazide,  Nexium, Celebrex, diazepam, and iron.  We will also add some lactulose to  the patient's regimen; she had been on the constipated side since she  started her iron pills.  Incidentally, her hematocrit was 33.1, her MCV was  85.  The patient was stable at the time of discharge.                                               [name], M.D.    WMM/MEDQ  D:  03/25/2003  T:  03/25/2003  Job:  [id number]

## 2010-12-20 NOTE — H&P (Signed)
[name] [name], [name] [name]                         ACCOUNT NO.:  [id number]   MEDICAL RECORD NO.:  [id number]                   PATIENT TYPE:  INP   LOCATION:  NA                                   FACILITY:  [facility]   PHYSICIAN:  Laurence Slate, M.D.                DATE OF BIRTH:  26-May-1923   DATE OF ADMISSION:  09/27/2002  DATE OF DISCHARGE:                                HISTORY & PHYSICAL   CHIEF COMPLAINT:  Left hip pain.   HISTORY OF PRESENT ILLNESS:  The patient is a 77 year old female status post  left total hip arthroplasty in 2001.  She has since been complaining of  increasing pain into the thigh.  She is also complaining of difficulty to  walk.  She has now gotten to the point where the pain has become unbearable  for her and she wishes to have something definitively done.  The risks and  benefits of the procedure have been discussed with the patient and patient  wishes to proceed.   PAST MEDICAL HISTORY:  1. History of gastric ulcer.  2. Diverticulitis.   PAST SURGICAL HISTORY:  1. Appendectomy.  2. Bilateral tubal ligation.  3. Vein stripping.  4. Partial hysterectomy.  5. Bladder tack.  6. Cholecystectomy.  7. Left total hip arthroplasty.   MEDICATIONS:  The patient currently does not have her medications with her.  She takes a pill for arthritis and a pill for the ulcer, possibly believed  to be Nexium.   ALLERGIES:  PENICILLIN causes rash.   SOCIAL HISTORY:  The patient denies any tobacco or alcohol.  She is a widow.  She lives alone.  She lives in a [place] house with a ramp entering the  house.   FAMILY HISTORY:  Father deceased, stroke and osteoarthritis.  Mother  deceased.  No major medical problems.   REVIEW OF SYSTEMS:  GENERAL:  Denies fevers, chills, night sweats, bleeding  tendencies.  CNS:  Denies blurry or double vision, seizures, headaches,  paralysis.  RESPIRATORY:  Positive shortness of breath with exertion.  Denies productive cough or  hemoptysis.  CARDIOVASCULAR:  Denies chest pain,  angina, orthopnea.  GASTROINTESTINAL:  Positive diarrhea, positive  constipation.  Denies nausea, vomiting, melena, bloody stools.  GENITOURINARY:  Denies dysuria, hematuria, discharge.  MUSCULOSKELETAL:  As  pertinent to HPI.   PHYSICAL EXAMINATION:  VITAL SIGNS:  Blood pressure 100/50, pulse 84,  respirations 12.  GENERAL:  Well-developed, well-nourished 77 year old female.  HEENT:  Normocephalic, atraumatic.  Pupils are equal, round, and reactive to  light.  NECK:  Supple.  No carotid bruit noted.  CHEST:  Clear to auscultation bilaterally.  No wheezes or crackles.  HEART:  Regularly irregular rhythm.  No murmurs.  ABDOMEN:  Soft, nontender, nondistended.  Positive bowel sounds x4.  EXTREMITIES:  Decreased range of motion of the left hip due to pain and is  tender to palpation  over the left hip and thigh.  SKIN:  No rashes or lesions.   LABORATORIES:  X-ray shows lytic area around cement.   IMPRESSION:  1. Loosening of left femoral stem.  2. History of gastric ulcer.  3. Diverticulitis.   PLAN:  The patient will be admitted to [facility] on September 27, 2002 and undergo a left revision of femoral component of total hip by  Laurence Slate, M.D.     Pedro Earls, P.A.-C.                   Laurence Slate, M.D.    SW/MEDQ  D:  09/20/2002  T:  09/20/2002  Job:  [id number]

## 2010-12-20 NOTE — Discharge Summary (Signed)
NAMESAFI, BRADFIELD NO.:  [id number]   MEDICAL RECORD NO.:  [id number]          PATIENT TYPE:  INP   LOCATION:  A301                          FACILITY:  [facility]   PHYSICIAN:  Carole Civil, M.D.DATE OF BIRTH:  1922-11-15   DATE OF ADMISSION:  05/19/2006  DATE OF DISCHARGE:  LH                                 DISCHARGE SUMMARY   CHIEF COMPLAINT:  Right knee pain.   ADMISSION DIAGNOSIS:  Osteoarthritis of the right knee.   DISCHARGE DIAGNOSIS:  1. Osteoarthritis of the right knee.   SECONDARY DIAGNOSIS:  Acute blood loss anemia from surgery.   COMORBIDITIES:  History of deep vein thrombosis during previous left total  hip replacement.Marland Kitchen   HISTORY:  She is an 77 year old female with long history of pain in her  right knee.  She failed conservative treatment and eventually presented on  this occasion for total knee replacement of the right lower extremity.   HOSPITAL COURSE:  The patient was admitted on May 19, 2006.  She  underwent an uncomplicated total knee replacement of the right knee with a  DePuy fixed bearing posterior stabilized total knee, size 4 tibia, size 3  femur, 15 polyethylene posterior stabilized insert with a 35 size patella.  She tolerated that well.   In the postoperative period she developed postoperative anemia.  Her  admission hemoglobin was 11 and went down to 8.1.  After two units on the  19th it was 9.0.  She also developed a fever and had a fever workup which  included a chest x-ray which showed bronchitic changes with minimal  bibasilar atelectasis.  There was a question of a minimal left lower lobe  infiltrate and a nodular density in this area.  Follow-up was recommended  for 2-4 weeks, which we will do as requested by radiology.  Her urinalysis  was negative, as part of her for a fever workup.   The patient progressed reasonably well in therapy.  She is having good pain  control.  Her wound looks fine.   DISCHARGE  MEDICATIONS:  1. Colace 100 mg orally twice a day.  2. Iron 325 mg orally three times a day.  3. Protonix 40 mg daily.  4. Potassium 20 mEq daily.  5. Fleet's enema p.r.n.  6. For pain medicine, Dilaudid 2 mg q.4 p.r.n. for pain.   ALLERGIES:  Patient is allergic to penicillin; causes rash.  Codeine causes  GI upset.   The patient was placed on Coumadin instead of Lovenox.  She is currently  receiving 10 mg every evening.  Her protime on the 19th is 20 with an INR of  1.6; we would like to get her INR to 2.5.   DISCHARGE INSTRUCTIONS:  On October 26 the staples can be removed as long as  the incision looks to be closed; if not, it can be postponed for additional  day or two.  She is full weightbearing.  She is to be in a CPM machine 6  hours a day, divided as necessary and as tolerated; it can be divided in  three  sessions of 2 hours or  two sessions of 3 hours, according the patient  tolerance.  She should have physical therapy daily to work on range of  motion of her right leg.  She will need a walker for assistance with that.   Follow-up visit with Dr. Aline Brochure in approximately 2-3 weeks.  Call [phone]  for appointment time.      Carole Civil, M.D.  Electronically Signed     SEH/MEDQ  D:  05/22/2006  T:  05/22/2006  Job:  [id number]   cc:   [name], M.D.  Fax: [phone]

## 2010-12-20 NOTE — Op Note (Signed)
NAMEKHLOEE, RAIFSNIDER                         ACCOUNT NO.:  [id number]   MEDICAL RECORD NO.:  [id number]                   PATIENT TYPE:  INP   LOCATION:  [id number]                                 FACILITY:  [facility]   PHYSICIAN:  Laurence Slate, M.D.                DATE OF BIRTH:  02-25-1923   DATE OF PROCEDURE:  09/27/2002  DATE OF DISCHARGE:                                 OPERATIVE REPORT   SURGEON:  Laurence Slate, M.D.   ASSISTANT:  Gaynelle Arabian, M.D.   PREOPERATIVE DIAGNOSES:  Painful left femoral component total hip with  suspected cement loosening.   POSTOPERATIVE DIAGNOSES:  Painful left femoral component total hip with  suspected cement loosening.   OPERATION:  Revision left femoral component of the total hip using Osteonics  restoration HA bowed hip stem left which was a 10 inch prosthesis with a  bowed stem 14 x 15 distal.   DESCRIPTION OF PROCEDURE:  After suitable general anesthesia she was  positioned in the right lateral decubitus position and the left hip was  prepped and draped routinely. An incision was made through the old incision  although a bit shorter over the buttocks area and longer distally as a long  trochanteric slide is anticipated.  The posterior capsule and rotators are  removed, the head is dislocated. The 28 mm ball is removed. The femur seems  to be relatively socked in and stable. I elected then to go ahead with the  osteotomy. Drill holes were then made along the lateral border past the tip  of the stem where we thought would be about the end of the long cement plug  column and then transversely across the femur and then some additional holes  on the other side and then this part was cracked off including the  trochanter and this long anterolateral component. The femur came out easily  with a certain amount of cement and all the proximal cement came out fairly  easily. There was a bit of the distal plug left that took some _____ to get  out  with chisel and then with a small drill hole and progressive drill and  then the threaded tap pulling out that and the plastic plug after which we  then reamed distally to a 16 anticipating using an 8 inch curved stem.  However, on putting it in it looked like there just was not enough stem to  go distally to give support where the transverse osteotomy was so I elected  to use a 10 inch stem with a 15 and 11 proximally. After trying both the 8  and the 10 making our decision, the osteotomized trochanter and extended  trochanter was replaced with Dall-Mile cables. The most proximal one was  tightened down, the other two were left with a holding device so we could  further tighten later and then we inserted the ____.  It appeared  as though  the greater trochanter wanted to open so I went back in and reamed a little  bit on the under side of the trochanter to allow more fit; however, as it  was seated and we turned the trochanter fractured just above the proximal  Dall-Miles cable necessitating [place] putting in another cable drilled through  the lesser trochanter and around the trochanter itself to bring it back into  play. The femoral neck was longer than that which was in before. A plus 5 C  tapered head had been on the previous prosthesis and we used a -3 on this  one to give a good fit. There was good stability still in flexion and  extension with a 28 mm head and we elected not to change the plastic and not  to go to a bigger head. A total Dall-Miles beaded cables were used and  tightened appropriately the last one being through the lesser trochanter and  around the trochanter. She lost about 3 units of blood during this  procedure, received that amount. The perforating vessels were ligated as we  came across them in exposing the lateral femur. The wound was dry at the end  and we closed it over a Hemovac. Rather  extensive wound sutured primarily with PDS and the fascia lata and the  fascia  of the gluteus maximus, #0 and 2-0 coated Vicryl in the very thick  fatty tissue that she had and staples in the skin. She tolerated the surgery  well and went to recovery with a triangular pillow.                                               Laurence Slate, M.D.    PC/MEDQ  D:  09/27/2002  T:  09/27/2002  Job:  [id number]

## 2010-12-20 NOTE — H&P (Signed)
NAME:  [name] [name], [name] [name]                   ACCOUNT NO.:  0   MEDICAL RECORD NO.:  [id number]           PATIENT TYPE:   LOCATION:                                 FACILITY:   PHYSICIAN:  R. Garfield Cornea, M.D. DATE OF BIRTH:  Mar 26, 1923   DATE OF ADMISSION:  09/29/2005  DATE OF DISCHARGE:  LH                                HISTORY & PHYSICAL   CHIEF COMPLAINT:  A 1-year history of intermittent hematochezia.   HISTORY OF PRESENT ILLNESS:  [name] [name] is a very pleasant, 77-year-  old, Caucasian female who is followed primarily by Dr. Laural Golden who was  scheduled to be seen today (my day in the office).  She was very agreeable  in seeing me in lieu of rescheduling to see Dr. Laural Golden at a later date.   [name] tells me that she has had intermittent, low volume hematochezia  with some red blood and some with darker red blood from time to time with  bowel movements intermittently now for nearly 12 months.  She has not told  any physician about this until recently talking to her daughter who  facilitated her getting an appointment here.   This lady has a history of undergoing a history of EGD and colonoscopy for  long-standing reflux and surveillance for history of colonic polyps back on  March 05, 2004.  She also was found to have nonerosive antral gastritis and  bulbar duodenitis felt to be possibly NSAID related.  She was previously  treated for H. pylori back around 2000, after she presented with a GI bleed  requiring EGD with therapeutic intervention.   She had no Barrett's esophagus on her most recent EGD.  As far as her  colonoscopy is concerned, she had pancolonic diverticulosis and a large  polyp in her sigmoid colon and small ascending colon polyp.  The polyp in  her sigmoid had adenocarcinoma in situ with no invasive tumor seen.  She was  slated to have a surveillance colonoscopy in August 2008.  [name] [name] has  not lost any weight.  Her reflux symptoms are well-controlled  on Nexium.  She does continue to take Celebrex 200 mg orally t.i.d.  She is not taking  any other nonsteroidals.  She does complain of fatigue and lack of energy.  She was sent to ED 6 months ago for apparent medication reaction related to  some cough medicines she was taking and she was noted to be anemic then, per  her report.  She takes Citrucel only sporadically.   PAST MEDICAL HISTORY:  1.  History of left hip replacement x2.  2.  Tendinitis of the left hip.  3.  Peptic ulcer disease requiring therapeutic intervention.  4.  H. pylori infection previously treated.  5.  Status post tubal ligation.  6.  Bladder sling.  7.  Partial hysterectomy.  8.  Varicose vein surgery.  9.  Status post lumpectomy for breast carcinoma previously.  10. Left eye cataract surgery.   CURRENT MEDICATIONS:  1.  Nexium 20 mg orally q.a.m.  2.  Celebrex 200 mg orally once daily (contradiction to t.i.d. as stated      above).  3.  Cyclobenzaprine 10 mg p.r.n.   ALLERGIES:  PENICILLIN and CODEINE.   FAMILY HISTORY:  No family history of colorectal cancer.  Mother died at age  52.  Father died at age 78 with CVA.  Mother died with asthma.   SOCIAL HISTORY:  The patient lives alone.  She is a widow.  She has five  healthy grandchildren.  She is retired.  She does not use tobacco or  alcohol.   REVIEW OF SYSTEMS:  As in history of present illness.   PHYSICAL EXAMINATION:  GENERAL:  A pleasant, 77 year old lady, resting  comfortably accompanied by her daughter.  VITAL SIGNS:  Weight 208.5, height 5 feet 6 inches.  Temperature 97.8, blood  pressure 122/74, pulse 60.  HEENT:  She does have somewhat pale conjunctivae.  No scleral icterus.  JVP  is not prominent.  CHEST:  Lungs are clear to auscultation.  CARDIAC:  Regular rate and rhythm without murmurs, rubs or gallops.  BREASTS:  Deferred.  ABDOMEN:  Nondistended.  Positive bowel sounds.  Soft, nontender without  appreciable mass or  hepatosplenomegaly.  EXTREMITIES:  No edema.  RECTAL:  No tags or external lesions.  Moderately good sphincter tone.  No  masses in the rectal vault.  No stool in the rectal vault.  Mucus Hemoccult  negative.   IMPRESSION:  [name] [name] is a pleasant, 77 year old lady with a  history of complicated peptic ulcer disease and history of a high-grade  adenoma (carcinoma in situ in a sigmoid polyp resected in 2005) who now  presents with a 1-year history of intermittent rectal bleeding.   PLAN:  This lady really needs to go ahead and have another colonoscopy now.  I have recommended this to Ms. Posten and her daughter.  Potential risks,  benefits and alternatives have been reviewed.  If colonoscopy is unrevealing  as to a site of bleeding, then we will go ahead and proceed with an EGD  subsequent to colonoscopy on the same day.  This approach has also been  reviewed with the associated risks, benefits and alternatives.  Will also go  ahead and check a CBC today and see where we stand.  Will make further  recommendations in the very near future.      Bridgette Habermann, M.D.  Electronically Signed     RMR/MEDQ  D:  09/29/2005  T:  09/30/2005  Job:  [id number]   cc:   [name], M.D.  Fax: [phone]

## 2011-01-23 ENCOUNTER — Other Ambulatory Visit (HOSPITAL_COMMUNITY): Payer: Self-pay | Admitting: Oncology

## 2011-01-23 ENCOUNTER — Encounter (HOSPITAL_COMMUNITY): Payer: Medicare Other | Attending: Oncology

## 2011-01-23 DIAGNOSIS — D509 Iron deficiency anemia, unspecified: Secondary | ICD-10-CM | POA: Insufficient documentation

## 2011-01-23 DIAGNOSIS — Z853 Personal history of malignant neoplasm of breast: Secondary | ICD-10-CM | POA: Insufficient documentation

## 2011-01-23 DIAGNOSIS — Z79899 Other long term (current) drug therapy: Secondary | ICD-10-CM | POA: Insufficient documentation

## 2011-01-23 DIAGNOSIS — Z85820 Personal history of malignant melanoma of skin: Secondary | ICD-10-CM | POA: Insufficient documentation

## 2011-01-23 LAB — CBC
HCT: 35.7 % — ABNORMAL LOW (ref 36.0–46.0)
MCV: 88.4 fL (ref 78.0–100.0)
RDW: 14.1 % (ref 11.5–15.5)
WBC: 5.5 10*3/uL (ref 4.0–10.5)

## 2011-01-31 ENCOUNTER — Encounter (HOSPITAL_COMMUNITY): Payer: Medicare Other

## 2011-01-31 DIAGNOSIS — D509 Iron deficiency anemia, unspecified: Secondary | ICD-10-CM

## 2011-01-31 DIAGNOSIS — Z853 Personal history of malignant neoplasm of breast: Secondary | ICD-10-CM

## 2011-01-31 DIAGNOSIS — C4370 Malignant melanoma of unspecified lower limb, including hip: Secondary | ICD-10-CM

## 2011-02-01 ENCOUNTER — Encounter (HOSPITAL_COMMUNITY): Payer: Self-pay

## 2011-02-01 DIAGNOSIS — N3090 Cystitis, unspecified without hematuria: Secondary | ICD-10-CM | POA: Insufficient documentation

## 2011-02-03 ENCOUNTER — Encounter: Payer: Self-pay | Admitting: Gastroenterology

## 2011-02-03 ENCOUNTER — Ambulatory Visit (INDEPENDENT_AMBULATORY_CARE_PROVIDER_SITE_OTHER): Payer: Medicare Other | Admitting: Gastroenterology

## 2011-02-03 VITALS — BP 152/78 | HR 79 | Temp 97.7°F | Ht 65.0 in | Wt 194.4 lb

## 2011-02-03 DIAGNOSIS — D649 Anemia, unspecified: Secondary | ICD-10-CM

## 2011-02-03 NOTE — Progress Notes (Signed)
Referring Provider: Dr. Tressie Stalker Primary Care Physician:  [name], MD Primary Gastroenterologist:  Dr. Gala Romney  Chief Complaint  Patient presents with  . low iron    HPI:  [name] [name] is a 77 y.o. female here as a referral from Dr. Tressie Stalker for anemia. Ferritin dropping over past several months. 57 in Jan, now 14. Hgb/Hct slightly below normal at 11.6, 35.7. Has hx of IDA in the past. She also does have hx of H.pylori, s/p treatment, PUD secondary to H.pylori, adenomatous polyps, internal hemorrhoids. Most recent colonoscopy in March 2010 with friable anal canal hemorrhoids, left-sided diverticula, hyperplastic ascending polyp, adenomatous descending polyp. Last EGD march 2007 with normal esophagus, small hiatal hernia, otherwise normal. She presents today feeling quite well. No abdominal pain. No N/V. Complains of lower right back pain. For about 3-4 days. Intermittent. Worsened with activity.  Has BM 2-3 times per day, sometimes postprandial, loose. Denies diarrhea. This is chronic for her.  Very rare constipation. No melena. Has hx of hemorrhoids, last saw brbpr about a month ago, was painful, no itching. Took suppositories for about 5 days, BID. Had a prescription. Sometimes bleeding at beginning is large amount, then tapers off. Rectal bleeding resolved with hemorrhoids.   Not eating high fiber diet. No dysphagia. No NSAIDs, aspirin powders.   Past Medical History  Diagnosis Date  . History of blood clots     in leg  . History of knee surgery   . Mini stroke   . Hip fracture     hip surgery 2001  . Stomach ulcer     secondary to h.pylori, s/p treatment  . Thyroid condition   . Bladder infection     History  . Kidney infection     History  . Melanoma of thigh     left  . DCIS (ductal carcinoma in situ) of breast     RIGHT BREAST  . Aneurysm, thoracic aortic   . Dehydration     HISTORY   . S/P colonoscopy March 2010    RMR: friable anal canal hemorrhoids,  hyperplastic ascending polyp, adenomatous descending polyp      Past Surgical History  Procedure Date  . Partial hysterectomy   . Leg surgery   . Breast lumpectomy   . Total hip arthroplasty 2001&2004     X 2 FOR LEFT HIP  . Appendectomy   . Cataract extraction, bilateral   . Knee surgery 05/2006    total right  . Melanoma excision     left leg excision  . Tonsillectomy   . Cholecystectomy   . Varicose vein surgery     Current Outpatient Prescriptions  Medication Sig Dispense Refill  . acetaminophen (ARTHRITIS PAIN RELIEF) 650 MG CR tablet Take 650 mg by mouth every 8 (eight) hours as needed.        . clopidogrel (PLAVIX) 75 MG tablet Take 75 mg by mouth daily.        Marland Kitchen esomeprazole (NEXIUM) 40 MG capsule Take 40 mg by mouth daily before breakfast.        . Glucosamine-Chondroit-Vit C-Mn (GLUCOSAMINE CHONDR 500 COMPLEX) CAPS Take 500 each by mouth 3 (three) times daily.        . Multiple Vitamins-Minerals (PRESERVISION AREDS 2) CAPS Take 2 each by mouth daily.        . potassium chloride (KLOR-CON) 20 MEQ packet Take 20 mEq by mouth daily.        . enalapril (VASOTEC) 5 MG tablet Take  5 mg by mouth daily.          Allergies as of 02/03/2011 - Review Complete 02/03/2011  Allergen Reaction Noted  . Codeine    . Iron Nausea And Vomiting 02/01/2011  . Penicillins      Family History  Problem Relation Age of Onset  . Colon cancer Neg Hx     History   Social History  . Marital Status: Widowed    Spouse Name: N/A    Number of Children: N/A  . Years of Education: N/A   Occupational History  . Not on file.   Social History Main Topics  . Smoking status: Former Smoker -- 2.0 packs/day    Types: Cigarettes  . Smokeless tobacco: Former Systems developer    Quit date: 08/06/1983  . Alcohol Use: No  . Drug Use: No  . Sexually Active: Not on file      Review of Systems: Gen: Denies any fever, chills, sweats, anorexia, fatigue, weakness, malaise, weight loss, and sleep  disorder CV: Denies chest pain, angina, palpitations, syncope, orthopnea, PND, peripheral edema, and claudication. Resp: Denies dyspnea at rest, dyspnea with exercise, cough, sputum, wheezing, coughing up blood, and pleurisy. GI: Denies vomiting blood, jaundice, and fecal incontinence.   Denies dysphagia or odynophagia. GU : Denies urinary burning, blood in urine, urinary frequency, urinary hesitancy, nocturnal urination, and urinary incontinence. MS: Denies joint pain, limitation of movement, and swelling, stiffness, low back pain, extremity pain. Denies muscle weakness, cramps, atrophy.  Derm: Denies rash, itching, dry skin, hives, moles, warts, or unhealing ulcers.  Psych: Denies depression, anxiety, memory loss, suicidal ideation, hallucinations, paranoia, and confusion. Heme: Denies bruising, bleeding, and enlarged lymph nodes.  Physical Exam: BP 152/78  Pulse 79  Temp(Src) 97.7 F (36.5 C) (Temporal)  Ht 5\' 5"  (1.651 m)  Wt 88.179 kg (194 lb 6.4 oz)  BMI 32.35 kg/m2 General:   Alert,  Well-developed, well-nourished, pleasant and cooperative in NAD Head:  Normocephalic and atraumatic. Eyes:  Sclera clear, no icterus.   Conjunctiva pink. Ears:  Normal auditory acuity. Nose:  No deformity, discharge,  or lesions. Mouth:  No deformity or lesions, dentition normal. Neck:  Supple; no masses or thyromegaly. Lungs:  Clear throughout to auscultation.   No wheezes, crackles, or rhonchi. No acute distress. Heart:  Regular rate and rhythm; no murmurs, clicks, rubs,  or gallops. Abdomen:  Soft, nontender and nondistended. No masses, hepatosplenomegaly or hernias noted. Normal bowel sounds, without guarding, and without rebound.   Rectal:  Small hemorrhoidal tag, non-thrombosed. Internal exam without discomfort, good sphincter tone, no gross blood noted.  Msk:  Symmetrical without gross deformities. Normal posture. Extremities:  Without clubbing or edema. Neurologic:  Alert and  oriented x4;   grossly normal neurologically. Skin:  Intact without significant lesions or rashes. Cervical Nodes:  No significant cervical adenopathy. Psych:  Alert and cooperative. Normal mood and affect.

## 2011-02-03 NOTE — Patient Instructions (Signed)
Please have labs completed. We will call you with results.  Also, please have stool sample completed as well.   Follow a high fiber diet. Please see handout. This will help tremendously with your recurrent hemorrhoids.  We will be contacting you soon regarding your labs and any upcoming procedures that need to be done.

## 2011-02-04 ENCOUNTER — Encounter: Payer: Self-pay | Admitting: Gastroenterology

## 2011-02-04 ENCOUNTER — Ambulatory Visit: Payer: Medicare Other | Admitting: Gastroenterology

## 2011-02-04 DIAGNOSIS — D649 Anemia, unspecified: Secondary | ICD-10-CM | POA: Insufficient documentation

## 2011-02-04 LAB — ANEMIA PANEL
Folate: >20 ng/mL
Iron: 522 ug/dL — ABNORMAL HIGH (ref 42–145)
RBC.: 4.22 MIL/uL (ref 3.87–5.11)
UIBC: <55 ug/dL

## 2011-02-04 NOTE — Assessment & Plan Note (Signed)
77 year old Caucasian female with quite an extensive hx to include PUD, H.pylori, hx of IDA. She has a fairly up-to-date colonoscopy on file (as of 2010), with noted internal hemorrhoids. Last EGD was in 2007, normal esophagus, small hiatal hernia, otherwise normal. Labs reviewed today, and ferritin has been slowly trending down although still low normal. Hgb/Hcg slightly decreased from normal. With pt's hx, may be multifactorial. No GI symptoms currently. Does have occasional episodes of brbpr that resolves with suppositories; this is likely r/t known hx of hemorrhoids. Due to complicated hx, will proceed with further work-up as follows:  Anemia panel ifobt High fiber diet  CBC in 6 weeks Monitor heme status over next 6-8 weeks. Depending on results from ifobt given to pt today, may need to repeat in 1 month. If positive, may need to proceed with TCS and EGD, capsule study if no significant findings.

## 2011-02-04 NOTE — Progress Notes (Signed)
Cc to PCP & Dr. Tressie Stalker

## 2011-02-06 DIAGNOSIS — D649 Anemia, unspecified: Secondary | ICD-10-CM

## 2011-02-10 NOTE — Progress Notes (Signed)
Would schedule pt for repeat TCS/EGD regardless. Discuss w/ RMR.

## 2011-02-18 NOTE — Progress Notes (Signed)
Negative. Addressed in epic under labs.

## 2011-02-21 ENCOUNTER — Other Ambulatory Visit: Payer: Self-pay

## 2011-02-21 DIAGNOSIS — D649 Anemia, unspecified: Secondary | ICD-10-CM

## 2011-02-28 ENCOUNTER — Encounter (HOSPITAL_COMMUNITY): Payer: Medicare Other | Attending: Oncology

## 2011-02-28 DIAGNOSIS — Z79899 Other long term (current) drug therapy: Secondary | ICD-10-CM | POA: Insufficient documentation

## 2011-02-28 DIAGNOSIS — D509 Iron deficiency anemia, unspecified: Secondary | ICD-10-CM | POA: Insufficient documentation

## 2011-02-28 DIAGNOSIS — D0590 Unspecified type of carcinoma in situ of unspecified breast: Secondary | ICD-10-CM

## 2011-02-28 DIAGNOSIS — Z86718 Personal history of other venous thrombosis and embolism: Secondary | ICD-10-CM

## 2011-02-28 DIAGNOSIS — Z853 Personal history of malignant neoplasm of breast: Secondary | ICD-10-CM | POA: Insufficient documentation

## 2011-02-28 DIAGNOSIS — Z85820 Personal history of malignant melanoma of skin: Secondary | ICD-10-CM | POA: Insufficient documentation

## 2011-02-28 LAB — CBC
HCT: 37.7 % (ref 36.0–46.0)
Hemoglobin: 12.3 g/dL (ref 12.0–15.0)
MCH: 29.4 pg (ref 26.0–34.0)
MCV: 90.2 fL (ref 78.0–100.0)
RBC: 4.18 MIL/uL (ref 3.87–5.11)

## 2011-02-28 LAB — FERRITIN: Ferritin: 374 ng/mL — ABNORMAL HIGH (ref 10–291)

## 2011-02-28 NOTE — Progress Notes (Signed)
Labs drawn today for cbc,ferr 

## 2011-03-25 ENCOUNTER — Other Ambulatory Visit: Payer: Self-pay | Admitting: Gastroenterology

## 2011-03-26 LAB — CBC WITH DIFFERENTIAL/PLATELET
Basophils Absolute: 0 10*3/uL (ref 0.0–0.1)
Basophils Relative: 0 % (ref 0–1)
Hemoglobin: 12.8 g/dL (ref 12.0–15.0)
MCHC: 32.1 g/dL (ref 30.0–36.0)
Neutro Abs: 3.1 10*3/uL (ref 1.7–7.7)
Neutrophils Relative %: 54 % (ref 43–77)
RDW: 15.9 % — ABNORMAL HIGH (ref 11.5–15.5)

## 2011-03-31 ENCOUNTER — Other Ambulatory Visit: Payer: Self-pay | Admitting: Gastroenterology

## 2011-03-31 DIAGNOSIS — D649 Anemia, unspecified: Secondary | ICD-10-CM

## 2011-04-02 NOTE — Progress Notes (Signed)
Discussed with Dr. Gala Romney as well. Will monitor Hgb status and ifobt. If any abnormalities, will pursue further.

## 2011-04-23 ENCOUNTER — Ambulatory Visit (INDEPENDENT_AMBULATORY_CARE_PROVIDER_SITE_OTHER): Payer: Medicare Other | Admitting: Gastroenterology

## 2011-04-23 DIAGNOSIS — D649 Anemia, unspecified: Secondary | ICD-10-CM

## 2011-04-23 LAB — IFOBT (OCCULT BLOOD): IFOBT: NEGATIVE

## 2011-04-25 ENCOUNTER — Encounter (HOSPITAL_COMMUNITY): Payer: Medicare Other | Attending: Oncology

## 2011-04-25 DIAGNOSIS — Z79899 Other long term (current) drug therapy: Secondary | ICD-10-CM | POA: Insufficient documentation

## 2011-04-25 DIAGNOSIS — Z85820 Personal history of malignant melanoma of skin: Secondary | ICD-10-CM | POA: Insufficient documentation

## 2011-04-25 DIAGNOSIS — D509 Iron deficiency anemia, unspecified: Secondary | ICD-10-CM | POA: Insufficient documentation

## 2011-04-25 DIAGNOSIS — Z853 Personal history of malignant neoplasm of breast: Secondary | ICD-10-CM | POA: Insufficient documentation

## 2011-04-25 DIAGNOSIS — D649 Anemia, unspecified: Secondary | ICD-10-CM

## 2011-04-25 LAB — CBC
Platelets: 162 10*3/uL (ref 150–400)
RDW: 14.5 % (ref 11.5–15.5)
WBC: 5.7 10*3/uL (ref 4.0–10.5)

## 2011-04-25 NOTE — Progress Notes (Signed)
Labs drawn today for cbc,ferr 

## 2011-04-28 ENCOUNTER — Telehealth (HOSPITAL_COMMUNITY): Payer: Self-pay | Admitting: *Deleted

## 2011-04-28 DIAGNOSIS — D649 Anemia, unspecified: Secondary | ICD-10-CM

## 2011-04-28 NOTE — Telephone Encounter (Signed)
Message copied by Berneta Levins on Mon Apr 28, 2011 12:09 PM ------      Message from: Tressie Stalker, ERIC S      Created: Mon Apr 28, 2011 11:31 AM       Repeat in 1 mo-will probably need iron after that.

## 2011-04-29 ENCOUNTER — Telehealth (HOSPITAL_COMMUNITY): Payer: Self-pay | Admitting: *Deleted

## 2011-04-29 LAB — CBC
Platelets: 203
RDW: 14.6

## 2011-04-30 ENCOUNTER — Telehealth (HOSPITAL_COMMUNITY): Payer: Self-pay | Admitting: *Deleted

## 2011-05-01 LAB — CBC
RBC: 4.15
WBC: 5.9

## 2011-05-01 LAB — FERRITIN: Ferritin: 32 (ref 10–291)

## 2011-05-01 NOTE — Progress Notes (Signed)
Quick Note:  No. Hold off on CBC next month. ______

## 2011-05-01 NOTE — Progress Notes (Signed)
Quick Note:  Ignore my last comment. Yes, still needs CBC next month. I believe Dr. Tressie Stalker has this ordered for her already. We need to see her back, too. Please make an OV for her in the next 1-2 weeks to discuss colonoscopy/EGD. Looks like ferritin trending down. ______

## 2011-05-06 ENCOUNTER — Telehealth (HOSPITAL_COMMUNITY): Payer: Self-pay | Admitting: *Deleted

## 2011-05-06 ENCOUNTER — Encounter: Payer: Self-pay | Admitting: Internal Medicine

## 2011-05-06 ENCOUNTER — Ambulatory Visit: Payer: Medicare Other | Admitting: Gastroenterology

## 2011-05-06 ENCOUNTER — Telehealth: Payer: Self-pay | Admitting: Gastroenterology

## 2011-05-06 NOTE — Telephone Encounter (Signed)
Pt was a no show

## 2011-05-06 NOTE — Telephone Encounter (Signed)
Spoke with pt informed her of upcoming lab appointment 10/23 at 10:50am. Verbalizes understanding.

## 2011-05-06 NOTE — Telephone Encounter (Signed)
Pt is aware of OV for 10/8 at 1030 w/AS

## 2011-05-06 NOTE — Telephone Encounter (Signed)
spoke with pt on 10/23

## 2011-05-06 NOTE — Progress Notes (Signed)
LMOM for patient to call me back to [facility] her OV

## 2011-05-06 NOTE — Telephone Encounter (Signed)
Spoke with pt on 10/2

## 2011-05-06 NOTE — Telephone Encounter (Signed)
Mailed letter for pt to call and set up her next OV

## 2011-05-06 NOTE — Telephone Encounter (Signed)
Please send letter. Pt needs to be seen for f/u. Thanks!

## 2011-05-07 NOTE — Progress Notes (Signed)
Spoke with pts daughter- explained to her that we needed to get pt to come back for ov in a couple of weeks to discuss tcs/egd. She said she would have pt call next week and schedule appt. Pt is going to [facility] and they want to see what they say first.

## 2011-05-07 NOTE — Progress Notes (Signed)
Pt is aware of OV on 10/8 at 1030 with AS

## 2011-05-08 LAB — CBC
HCT: 37.2 % (ref 36.0–46.0)
Hemoglobin: 12.7 g/dL (ref 12.0–15.0)
RBC: 4.06 MIL/uL (ref 3.87–5.11)
WBC: 6 10*3/uL (ref 4.0–10.5)

## 2011-05-12 ENCOUNTER — Encounter: Payer: Self-pay | Admitting: Gastroenterology

## 2011-05-12 ENCOUNTER — Ambulatory Visit (INDEPENDENT_AMBULATORY_CARE_PROVIDER_SITE_OTHER): Payer: Medicare Other | Admitting: Gastroenterology

## 2011-05-12 VITALS — BP 135/75 | HR 75 | Temp 97.7°F | Ht 66.0 in | Wt 199.4 lb

## 2011-05-12 DIAGNOSIS — D649 Anemia, unspecified: Secondary | ICD-10-CM

## 2011-05-12 MED ORDER — HYDROCORTISONE ACETATE 25 MG RE SUPP: 25.0000 mg | Freq: Two times a day (BID) | RECTAL | Status: DC

## 2011-05-12 NOTE — Assessment & Plan Note (Addendum)
77 year old Caucasian female with an extensive hx to include PUD secondary to H.pylori (s/p treatment), hx of IDA. Colonoscopy last in 2010 with friable anal canal hemorrhoids, adenomatous polyp. Last EGD in 2007, normal. Ferritin, as outlined in HPI, had been drifting down. Our plan was to follow CBC, ferritin, and ifobt status. Thus far, 2 ifobts have been negative; however, ferritin has been slowly drifting again although it is still within normal range. She has had intermittent paper hematochezia, likely related to known friable anal canal. She also has occasional fecal incontinence, which is not a new finding either. At this point, with her complicated history, I discussed possibility of repeating colonoscopy with EGD to ensure there are no sources for her IDA; this may be multifactorial at this time. We will discuss this with Dr. Gala Romney prior to setting up these two procedures. She is overall asymptomatic otherwise. As of note, she would not let me complete a rectal exam.   Anusol suppositories to pharmacy (7 day supply) Possible EGD/TCS in future with Dr. Gala Romney; the R/B/A have been discussed in detail with pt. Further recommendations to follow shortly 6 months follow-up    ADDENDUM 10/15: Will proceed with EGD/TCS with Dr. Gala Romney. Will also order H.pylori stool antigen due to hx.

## 2011-05-12 NOTE — Patient Instructions (Signed)
We have called in a prescription for suppositories if you have any further hemorrhoid issues.  I will be discussing with Dr. Gala Romney the plan and calling you back very soon.  No lab work today. This will be due around Oct 21.

## 2011-05-12 NOTE — Progress Notes (Signed)
Referring Provider: [name], MD Primary Care Physician:  [name], MD Primary Gastroenterologist: Dr. Gala Romney   Chief Complaint  Patient presents with  . Follow-up    HPI:   [name] is a pleasant 77 year old female with a history of IDA in the past, H.pylori s/p treatment, PUD, adenomatous polyps, and internal hemorrhoids. Her last EGD was in March 2007 with normal esophagus, small hiatal hernia. Last colonoscopy March 2010 with friable anal canal hemorrhoids, left-sided diverticula, hyerplastic and adenomatous polyps. She was last seen by myself in July 2012 at Dr. Jaclyn Prime request due to dropping ferritin. It was 85 in January, then 14 in June. It increased to 736 in July, and has been steadily trending down. In the interim from the last visit, the plan was to obtain 1-2 ifobt over the subsequent 6-8 weeks, as well as monitor CBC/ferritin status. She had no signs of overt GI bleeding and denied any upper or lower GI symptoms. 2 ifobts from our office have been returned negative. Recent labs are outlined below.  She returns today with Intermittent paper hematochezia. No abdominal pain. No significant change in bowel habits. Sometimes fecal urgency, then messes pants up. No difficulty swallowing. Occasional pruritis rectally. No n/v.  Iron infusion first of year (Jan 2012). In Nov/Dec surgery on eyes upcoming. Refused rectal exam.   Sept 21, Hgb 13.2  Ferritin: March 2011: 186 Jan 2012:      36 June 2012:    15 February 2011:     736, 374 Sept 2012:    134   Past Medical History  Diagnosis Date  . History of blood clots     in leg  . History of knee surgery   . Mini stroke   . Hip fracture     hip surgery 2001  . Stomach ulcer     secondary to h.pylori, s/p treatment  . Thyroid condition   . Bladder infection     History  . Kidney infection     History  . Melanoma of thigh     left  . DCIS (ductal carcinoma in situ) of breast     RIGHT BREAST  .  Aneurysm, thoracic aortic   . Dehydration     HISTORY   . S/P colonoscopy March 2010    RMR: friable anal canal hemorrhoids, hyperplastic ascending polyp, adenomatous descending polyp     Past Surgical History  Procedure Date  . Partial hysterectomy   . Leg surgery   . Breast lumpectomy   . Total hip arthroplasty 2001&2004     X 2 FOR LEFT HIP  . Appendectomy   . Cataract extraction, bilateral   . Knee surgery 05/2006    total right  . Melanoma excision     left leg excision  . Tonsillectomy   . Cholecystectomy   . Varicose vein surgery     Current Outpatient Prescriptions  Medication Sig Dispense Refill  . acetaminophen (ARTHRITIS PAIN RELIEF) 650 MG CR tablet Take 650 mg by mouth every 8 (eight) hours as needed.        . clopidogrel (PLAVIX) 75 MG tablet Take 75 mg by mouth daily.        Marland Kitchen esomeprazole (NEXIUM) 40 MG capsule Take 40 mg by mouth daily before breakfast.        . Multiple Vitamin (MULTIVITAMIN) capsule Take 1 capsule by mouth daily.        . Multiple Vitamins-Minerals (PRESERVISION AREDS 2) CAPS Take 2 each by  mouth daily.        . sodium chloride (MURO 128) 2 % ophthalmic solution Place 3 drops into both eyes.        Marland Kitchen enalapril (VASOTEC) 5 MG tablet Take 5 mg by mouth daily.        . Glucosamine-Chondroit-Vit C-Mn (GLUCOSAMINE CHONDR 500 COMPLEX) CAPS Take 500 each by mouth 3 (three) times daily.        . potassium chloride (KLOR-CON) 20 MEQ packet Take 20 mEq by mouth daily.          Allergies as of 05/12/2011 - Review Complete 05/12/2011  Allergen Reaction Noted  . Codeine Diarrhea   . Iron Nausea And Vomiting 02/01/2011  . Penicillins Rash     Family History  Problem Relation Age of Onset  . Colon cancer Neg Hx     History   Social History  . Marital Status: Widowed    Spouse Name: N/A    Number of Children: N/A  . Years of Education: N/A   Social History Main Topics  . Smoking status: Former Smoker -- 2.0 packs/day    Types: Cigarettes   . Smokeless tobacco: Former Systems developer    Quit date: 08/06/1983  . Alcohol Use: No  . Drug Use: No  . Sexually Active: None    Review of Systems: Gen: Denies fever, chills, anorexia. Denies fatigue, weakness, weight loss.  CV: Denies chest pain, palpitations, syncope, peripheral edema, and claudication. Resp: Denies dyspnea at rest, cough, wheezing, coughing up blood, and pleurisy. GI: Denies vomiting blood, jaundice.   Denies dysphagia or odynophagia. Derm: Denies rash, itching, dry skin Psych: Denies depression, anxiety, memory loss, confusion. No homicidal or suicidal ideation.  Heme: Denies bruising, bleeding, and enlarged lymph nodes.  Physical Exam: BP 135/75  Pulse 75  Temp(Src) 97.7 F (36.5 C) (Temporal)  Ht 5\' 6"  (1.676 m)  Wt 199 lb 6.4 oz (90.447 kg)  BMI 32.18 kg/m2 General:   Alert and oriented. No distress noted. Pleasant and cooperative.  Head:  Normocephalic and atraumatic. Eyes:  Conjuctiva clear without scleral icterus. Mouth:  Oral mucosa pink and moist. Good dentition. No lesions. Neck:  Supple, without mass or thyromegaly. Heart:  S1, S2 present . Regular.  Abdomen:  +BS, soft, non-tender and non-distended. No rebound or guarding. No HSM or masses noted. Msk:  Symmetrical without gross deformities. Normal posture. Extremities:  1+ edema Neurologic:  Alert and  oriented x4;  grossly normal neurologically. Skin:  Intact without significant lesions or rashes. Cervical Nodes:  No significant cervical adenopathy. Psych:  Alert and cooperative. Normal mood and affect.

## 2011-05-12 NOTE — Progress Notes (Signed)
Cc to PCP 

## 2011-05-12 NOTE — Progress Notes (Deleted)
Intermittent paper hematocheiza. No abdominal pain. No change in bowel habits. Sometimes fecal urgency, then messes pants up. No difficulty swallowing. Occasional pruritis rectally. No n/v.   Iron infusion first of year. In Nov/Dec surgery on eyes upcoming.   Referring Provider: [name], MD Primary Care Physician:  [name], MD  Chief Complaint  Patient presents with  . Follow-up    HPI:    Past Medical History  Diagnosis Date  . History of blood clots     in leg  . History of knee surgery   . Mini stroke   . Hip fracture     hip surgery 2001  . Stomach ulcer     secondary to h.pylori, s/p treatment  . Thyroid condition   . Bladder infection     History  . Kidney infection     History  . Melanoma of thigh     left  . DCIS (ductal carcinoma in situ) of breast     RIGHT BREAST  . Aneurysm, thoracic aortic   . Dehydration     HISTORY   . S/P colonoscopy March 2010    RMR: friable anal canal hemorrhoids, hyperplastic ascending polyp, adenomatous descending polyp     Past Surgical History  Procedure Date  . Partial hysterectomy   . Leg surgery   . Breast lumpectomy   . Total hip arthroplasty 2001&2004     X 2 FOR LEFT HIP  . Appendectomy   . Cataract extraction, bilateral   . Knee surgery 05/2006    total right  . Melanoma excision     left leg excision  . Tonsillectomy   . Cholecystectomy   . Varicose vein surgery     Current Outpatient Prescriptions  Medication Sig Dispense Refill  . acetaminophen (ARTHRITIS PAIN RELIEF) 650 MG CR tablet Take 650 mg by mouth every 8 (eight) hours as needed.        . clopidogrel (PLAVIX) 75 MG tablet Take 75 mg by mouth daily.        Marland Kitchen esomeprazole (NEXIUM) 40 MG capsule Take 40 mg by mouth daily before breakfast.        . Multiple Vitamin (MULTIVITAMIN) capsule Take 1 capsule by mouth daily.        . Multiple Vitamins-Minerals (PRESERVISION AREDS 2) CAPS Take 2 each by mouth daily.        . sodium  chloride (MURO 128) 2 % ophthalmic solution Place 3 drops into both eyes.        Marland Kitchen enalapril (VASOTEC) 5 MG tablet Take 5 mg by mouth daily.        . Glucosamine-Chondroit-Vit C-Mn (GLUCOSAMINE CHONDR 500 COMPLEX) CAPS Take 500 each by mouth 3 (three) times daily.        . potassium chloride (KLOR-CON) 20 MEQ packet Take 20 mEq by mouth daily.          Allergies as of 05/12/2011 - Review Complete 05/12/2011  Allergen Reaction Noted  . Codeine Diarrhea   . Iron Nausea And Vomiting 02/01/2011  . Penicillins Rash     Family History  Problem Relation Age of Onset  . Colon cancer Neg Hx     History   Social History  . Marital Status: Widowed    Spouse Name: N/A    Number of Children: N/A  . Years of Education: N/A   Social History Main Topics  . Smoking status: Former Smoker -- 2.0 packs/day    Types: Cigarettes  . Smokeless  tobacco: Former Systems developer    Quit date: 08/06/1983  . Alcohol Use: No  . Drug Use: No  . Sexually Active: None   Other Topics Concern  . None   Social History Narrative  . None    Review of Systems: Gen: Denies fever, chills, anorexia. Denies fatigue, weakness, weight loss.  CV: Denies chest pain, palpitations, syncope, peripheral edema, and claudication. Resp: Denies dyspnea at rest, cough, wheezing, coughing up blood, and pleurisy. GI: Denies vomiting blood, jaundice, and fecal incontinence.   Denies dysphagia or odynophagia. Derm: Denies rash, itching, dry skin Psych: Denies depression, anxiety, memory loss, confusion. No homicidal or suicidal ideation.  Heme: Denies bruising, bleeding, and enlarged lymph nodes.  Physical Exam: BP 135/75  Pulse 75  Temp(Src) 97.7 F (36.5 C) (Temporal)  Ht 5\' 6"  (1.676 m)  Wt 199 lb 6.4 oz (90.447 kg)  BMI 32.18 kg/m2 General:   Alert and oriented. No distress noted. Pleasant and cooperative.  Head:  Normocephalic and atraumatic. Eyes:  Conjuctiva clear without scleral icterus. Mouth:  Oral mucosa pink and  moist. Good dentition. No lesions. Neck:  Supple, without mass or thyromegaly. Heart:  S1, S2 present without murmurs, rubs, or gallops. Regular rate and rhythm. Abdomen:  +BS, soft, non-tender and non-distended. No rebound or guarding. No HSM or masses noted. Msk:  Symmetrical without gross deformities. Normal posture. Pulses:  2+ DP noted bilaterally Extremities:  Without edema. Neurologic:  Alert and  oriented x4;  grossly normal neurologically. Skin:  Intact without significant lesions or rashes. Cervical Nodes:  No significant cervical adenopathy. Psych:  Alert and cooperative. Normal mood and affect.

## 2011-05-15 LAB — CBC
HCT: 37.9
Hemoglobin: 12.9
MCHC: 34.1
MCV: 90
RDW: 14.5 — ABNORMAL HIGH

## 2011-05-15 LAB — IRON AND TIBC: TIBC: 334

## 2011-05-21 ENCOUNTER — Other Ambulatory Visit: Payer: Self-pay | Admitting: Gastroenterology

## 2011-05-21 ENCOUNTER — Telehealth: Payer: Self-pay | Admitting: Gastroenterology

## 2011-05-21 DIAGNOSIS — D649 Anemia, unspecified: Secondary | ICD-10-CM

## 2011-05-21 MED ORDER — PEG-KCL-NACL-NASULF-NA ASC-C 100 G PO SOLR: 1.0000 | Freq: Once | ORAL | Status: DC

## 2011-05-21 NOTE — Telephone Encounter (Signed)
Pt scheduled for TCS/EGD on 06/05/11- instructions mailed- script sent to [facility]

## 2011-05-21 NOTE — Progress Notes (Signed)
Called pt- she would like to check with her daughters before scheduling- she will have one of them call me to schedule

## 2011-05-27 ENCOUNTER — Other Ambulatory Visit (HOSPITAL_COMMUNITY): Payer: Self-pay | Admitting: Oncology

## 2011-05-27 ENCOUNTER — Other Ambulatory Visit: Payer: Self-pay | Admitting: Gastroenterology

## 2011-05-27 ENCOUNTER — Encounter (HOSPITAL_COMMUNITY): Payer: Medicare Other | Attending: Oncology

## 2011-05-27 DIAGNOSIS — A048 Other specified bacterial intestinal infections: Secondary | ICD-10-CM

## 2011-05-27 DIAGNOSIS — Z85820 Personal history of malignant melanoma of skin: Secondary | ICD-10-CM | POA: Insufficient documentation

## 2011-05-27 DIAGNOSIS — Z79899 Other long term (current) drug therapy: Secondary | ICD-10-CM | POA: Insufficient documentation

## 2011-05-27 DIAGNOSIS — Z853 Personal history of malignant neoplasm of breast: Secondary | ICD-10-CM | POA: Insufficient documentation

## 2011-05-27 DIAGNOSIS — D509 Iron deficiency anemia, unspecified: Secondary | ICD-10-CM | POA: Insufficient documentation

## 2011-05-27 DIAGNOSIS — C4370 Malignant melanoma of unspecified lower limb, including hip: Secondary | ICD-10-CM

## 2011-05-27 LAB — CBC
HCT: 37.5 % (ref 36.0–46.0)
Hemoglobin: 12.3 g/dL (ref 12.0–15.0)
MCH: 30.7 pg (ref 26.0–34.0)
MCHC: 32.8 g/dL (ref 30.0–36.0)
MCV: 93.5 fL (ref 78.0–100.0)
RDW: 13.7 % (ref 11.5–15.5)

## 2011-05-27 NOTE — Progress Notes (Signed)
Labs drawn today for cbc,ferr 

## 2011-05-28 ENCOUNTER — Telehealth (HOSPITAL_COMMUNITY): Payer: Self-pay | Admitting: *Deleted

## 2011-05-28 ENCOUNTER — Other Ambulatory Visit (HOSPITAL_COMMUNITY): Payer: Self-pay | Admitting: Oncology

## 2011-05-28 DIAGNOSIS — D649 Anemia, unspecified: Secondary | ICD-10-CM

## 2011-05-28 NOTE — Telephone Encounter (Signed)
Message left on pt's answering machine as below.

## 2011-05-28 NOTE — Telephone Encounter (Signed)
Message copied by Berneta Levins on Wed May 28, 2011 10:24 AM ------      Message from: Tressie Stalker, ERIC S      Created: Tue May 27, 2011  5:59 PM       Stable.

## 2011-06-04 MED ORDER — SODIUM CHLORIDE 0.45 % IV SOLN
Freq: Once | INTRAVENOUS | Status: AC
  Administered 2011-06-05: 10:00:00 via INTRAVENOUS

## 2011-06-05 ENCOUNTER — Encounter: Payer: Self-pay | Admitting: Gastroenterology

## 2011-06-05 ENCOUNTER — Other Ambulatory Visit: Payer: Self-pay | Admitting: Internal Medicine

## 2011-06-05 ENCOUNTER — Ambulatory Visit (HOSPITAL_COMMUNITY)
Admission: RE | Admit: 2011-06-05 | Discharge: 2011-06-05 | Disposition: A | Discharge status: Home / Self Care | Payer: Medicare Other | Source: Ambulatory Visit | Attending: Internal Medicine | Admitting: Internal Medicine

## 2011-06-05 ENCOUNTER — Encounter (HOSPITAL_COMMUNITY)
Admission: RE | Disposition: A | Discharge status: Home / Self Care | Payer: Self-pay | Source: Ambulatory Visit | Attending: Internal Medicine

## 2011-06-05 DIAGNOSIS — R933 Abnormal findings on diagnostic imaging of other parts of digestive tract: Secondary | ICD-10-CM

## 2011-06-05 DIAGNOSIS — Z853 Personal history of malignant neoplasm of breast: Secondary | ICD-10-CM | POA: Insufficient documentation

## 2011-06-05 DIAGNOSIS — D509 Iron deficiency anemia, unspecified: Secondary | ICD-10-CM | POA: Insufficient documentation

## 2011-06-05 DIAGNOSIS — A048 Other specified bacterial intestinal infections: Secondary | ICD-10-CM | POA: Insufficient documentation

## 2011-06-05 DIAGNOSIS — K2940 Chronic atrophic gastritis without bleeding: Secondary | ICD-10-CM | POA: Insufficient documentation

## 2011-06-05 DIAGNOSIS — K921 Melena: Secondary | ICD-10-CM | POA: Insufficient documentation

## 2011-06-05 DIAGNOSIS — D649 Anemia, unspecified: Secondary | ICD-10-CM

## 2011-06-05 DIAGNOSIS — K449 Diaphragmatic hernia without obstruction or gangrene: Secondary | ICD-10-CM | POA: Insufficient documentation

## 2011-06-05 DIAGNOSIS — K5730 Diverticulosis of large intestine without perforation or abscess without bleeding: Secondary | ICD-10-CM | POA: Insufficient documentation

## 2011-06-05 HISTORY — PX: COLONOSCOPY: SHX174

## 2011-06-05 HISTORY — PX: ESOPHAGOGASTRODUODENOSCOPY: SHX1529

## 2011-06-05 SURGERY — COLONOSCOPY WITH ESOPHAGOGASTRODUODENOSCOPY (EGD)
Anesthesia: Moderate Sedation

## 2011-06-05 MED ORDER — STERILE WATER FOR IRRIGATION IR SOLN
Status: DC | PRN
  Administered 2011-06-05: 10:00:00

## 2011-06-05 MED ORDER — MEPERIDINE HCL 100 MG/ML IJ SOLN
INTRAMUSCULAR | Status: DC | PRN
  Administered 2011-06-05 (×2): 25 mg via INTRAVENOUS

## 2011-06-05 MED ORDER — BUTAMBEN-TETRACAINE-BENZOCAINE 2-2-14 % EX AERO
INHALATION_SPRAY | CUTANEOUS | Status: DC | PRN
  Administered 2011-06-05: 1 via TOPICAL

## 2011-06-05 MED ORDER — MIDAZOLAM HCL 5 MG/5ML IJ SOLN
INTRAMUSCULAR | Status: DC | PRN
  Administered 2011-06-05 (×2): 1 mg via INTRAVENOUS

## 2011-06-05 MED ORDER — MEPERIDINE HCL 100 MG/ML IJ SOLN
INTRAMUSCULAR | Status: AC
  Filled 2011-06-05: qty 1

## 2011-06-05 MED ORDER — MIDAZOLAM HCL 5 MG/5ML IJ SOLN
INTRAMUSCULAR | Status: AC
  Filled 2011-06-05: qty 10

## 2011-06-05 NOTE — H&P (Signed)
[name] Emperor, NP 05/12/2011 2:58 PM Signed  Referring Provider: [name], MD  Primary Care Physician: [name], MD  Primary Gastroenterologist: Dr. Gala Romney  Chief Complaint   Patient presents with   .  Follow-up    HPI:  [name] [name] is a pleasant 77 year old female with a history of IDA in the past, H.pylori s/p treatment, PUD, adenomatous polyps, and internal hemorrhoids. Her last EGD was in March 2007 with normal esophagus, small hiatal hernia. Last colonoscopy March 2010 with friable anal canal hemorrhoids, left-sided diverticula, hyerplastic and adenomatous polyps. She was last seen by myself in July 2012 at Dr. Jaclyn Prime request due to dropping ferritin. It was 31 in January, then 14 in June. It increased to 736 in July, and has been steadily trending down. In the interim from the last visit, the plan was to obtain 1-2 ifobt over the subsequent 6-8 weeks, as well as monitor CBC/ferritin status. She had no signs of overt GI bleeding and denied any upper or lower GI symptoms. 2 ifobts from our office have been returned negative. Recent labs are outlined below.  She returns today with Intermittent paper hematochezia. No abdominal pain. No significant change in bowel habits. Sometimes fecal urgency, then messes pants up. No difficulty swallowing. Occasional pruritis rectally. No n/v.  Iron infusion first of year (Jan 2012). In Nov/Dec surgery on eyes upcoming. Refused rectal exam.  Sept 21, Hgb 13.2  Ferritin:  March 2011: 186  Jan 2012: 36  June 2012: 15 February 2011: 736, 374  Sept 2012: 134  Past Medical History   Diagnosis  Date   .  History of blood clots      in leg   .  History of knee surgery    .  Mini stroke    .  Hip fracture      hip surgery 2001   .  Stomach ulcer      secondary to h.pylori, s/p treatment   .  Thyroid condition    .  Bladder infection      History   .  Kidney infection      History   .  Melanoma of thigh      left   .  DCIS (ductal  carcinoma in situ) of breast      RIGHT BREAST   .  Aneurysm, thoracic aortic    .  Dehydration      HISTORY   .  S/P colonoscopy  March 2010     RMR: friable anal canal hemorrhoids, hyperplastic ascending polyp, adenomatous descending polyp    Past Surgical History   Procedure  Date   .  Partial hysterectomy    .  Leg surgery    .  Breast lumpectomy    .  Total hip arthroplasty  2001&2004     X 2 FOR LEFT HIP   .  Appendectomy    .  Cataract extraction, bilateral    .  Knee surgery  05/2006     total right   .  Melanoma excision      left leg excision   .  Tonsillectomy    .  Cholecystectomy    .  Varicose vein surgery     Current Outpatient Prescriptions   Medication  Sig  Dispense  Refill   .  acetaminophen (ARTHRITIS PAIN RELIEF) 650 MG CR tablet  Take 650 mg by mouth every 8 (eight) hours as needed.     .  clopidogrel (  PLAVIX) 75 MG tablet  Take 75 mg by mouth daily.     Marland Kitchen  esomeprazole (NEXIUM) 40 MG capsule  Take 40 mg by mouth daily before breakfast.     .  Multiple Vitamin (MULTIVITAMIN) capsule  Take 1 capsule by mouth daily.     .  Multiple Vitamins-Minerals (PRESERVISION AREDS 2) CAPS  Take 2 each by mouth daily.     .  sodium chloride (MURO 128) 2 % ophthalmic solution  Place 3 drops into both eyes.     Marland Kitchen  enalapril (VASOTEC) 5 MG tablet  Take 5 mg by mouth daily.     .  Glucosamine-Chondroit-Vit C-Mn (GLUCOSAMINE CHONDR 500 COMPLEX) CAPS  Take 500 each by mouth 3 (three) times daily.     .  potassium chloride (KLOR-CON) 20 MEQ packet  Take 20 mEq by mouth daily.      Allergies as of 05/12/2011 - Review Complete 05/12/2011   Allergen  Reaction  Noted   .  Codeine  Diarrhea    .  Iron  Nausea And Vomiting  02/01/2011   .  Penicillins  Rash     Family History   Problem  Relation  Age of Onset   .  Colon cancer  Neg Hx     History    Social History   .  Marital Status:  Widowed     Spouse Name:  N/A     Number of Children:  N/A   .  Years of Education:   N/A    Social History Main Topics   .  Smoking status:  Former Smoker -- 2.0 packs/day     Types:  Cigarettes   .  Smokeless tobacco:  Former Systems developer     Quit date:  08/06/1983   .  Alcohol Use:  No   .  Drug Use:  No   .  Sexually Active:  None   Review of Systems:  Gen: Denies fever, chills, anorexia. Denies fatigue, weakness, weight loss.  CV: Denies chest pain, palpitations, syncope, peripheral edema, and claudication.  Resp: Denies dyspnea at rest, cough, wheezing, coughing up blood, and pleurisy.  GI: Denies vomiting blood, jaundice. Denies dysphagia or odynophagia.  Derm: Denies rash, itching, dry skin  Psych: Denies depression, anxiety, memory loss, confusion. No homicidal or suicidal ideation.  Heme: Denies bruising, bleeding, and enlarged lymph nodes.  Physical Exam:  BP 135/75  Pulse 75  Temp(Src) 97.7 F (36.5 C) (Temporal)  Ht 5\' 6"  (1.676 m)  Wt 199 lb 6.4 oz (90.447 kg)  BMI 32.18 kg/m2  General: Alert and oriented. No distress noted. Pleasant and cooperative.  Head: Normocephalic and atraumatic.  Eyes: Conjuctiva clear without scleral icterus.  Mouth: Oral mucosa pink and moist. Good dentition. No lesions.  Neck: Supple, without mass or thyromegaly.  Heart: S1, S2 present . Regular.  Abdomen: +BS, soft, non-tender and non-distended. No rebound or guarding. No HSM or masses noted.  Msk: Symmetrical without gross deformities. Normal posture.  Extremities: 1+ edema  Neurologic: Alert and oriented x4; grossly normal neurologically.  Skin: Intact without significant lesions or rashes.  Cervical Nodes: No significant cervical adenopathy.  Psych: Alert and cooperative. Normal mood and affect.   [name] [name] 05/12/2011 3:23 PM Signed  Cc to PCP [name] [name] Dawn 05/21/2011 1:40 PM Signed  Called pt- she would like to check with her daughters before scheduling- she will have one of them call me to schedule  Anemia - [name] Emperor,  NP 05/19/2011 1:21 PM Addendum    77 year old Caucasian female with an extensive hx to include PUD secondary to H.pylori (s/p treatment), hx of IDA. Colonoscopy last in 2010 with friable anal canal hemorrhoids, adenomatous polyp. Last EGD in 2007, normal. Ferritin, as outlined in HPI, had been drifting down. Our plan was to follow CBC, ferritin, and ifobt status. Thus far, 2 ifobts have been negative; however, ferritin has been slowly drifting again although it is still within normal range. She has had intermittent paper hematochezia, likely related to known friable anal canal. She also has occasional fecal incontinence, which is not a new finding either. At this point, with her complicated history, I discussed possibility of repeating colonoscopy with EGD to ensure there are no sources for her IDA; this may be multifactorial at this time. We will discuss this with Dr. Gala Romney prior to setting up these two procedures. She is overall asymptomatic otherwise. As of note, she would not let me complete a rectal exam.  Anusol suppositories to pharmacy (7 day supply)  Possible EGD/TCS in future with Dr. Gala Romney; the R/B/A have been discussed in detail with pt.  Further recommendations to follow shortly  6 months follow-up    I have seen & examined the patient prior to the procedure(s) today and reviewed the history and physical/consultation.  There have been no changes.  After consideration of the risks, benefits, alternatives and imponderables, the patient has consented to the procedure(s).

## 2011-06-11 ENCOUNTER — Telehealth: Payer: Self-pay | Admitting: Gastroenterology

## 2011-06-11 MED ORDER — BIS SUBCIT-METRONID-TETRACYC 140-125-125 MG PO CAPS: 3.0000 | ORAL_CAPSULE | Freq: Three times a day (TID) | ORAL | Status: AC

## 2011-06-11 NOTE — Telephone Encounter (Signed)
Tried to call pt- LMOM 

## 2011-06-11 NOTE — Telephone Encounter (Signed)
Pt has evidence of H.pylori from biopsies taken at time of EGD. She has a hx of this in remote past as well. She needs to be treated, then return to our office in about 4-6 weeks.   I have sent in a rx for Pylera to her pharmacy. She needs to take 3 capsules with each meal and at bedtime, for a total of 4 times per day (12 pills per day). Do this for 10 days. In addition, needs to take a PPI BID. It appears she is on Plavix and Nexium. This in fact could be an issue due to interactions. Let's change to Dexilant BID for 10 days. Stop Nexium. Once this is done, she can resume Dexilant only once daily. May provide samples of Dexilant if needed.

## 2011-06-12 NOTE — Telephone Encounter (Signed)
Pt is aware of OV on 07/14/11 @ 10 w/AS

## 2011-06-12 NOTE — Telephone Encounter (Signed)
pts daughter Joaquim Lai aware. Samples at front desk.   Please schedule ov

## 2011-06-16 ENCOUNTER — Emergency Department (HOSPITAL_COMMUNITY)
Admission: EM | Admit: 2011-06-16 | Discharge: 2011-06-17 | Disposition: A | Discharge status: Home / Self Care | Payer: Medicare Other | Attending: Emergency Medicine | Admitting: Emergency Medicine

## 2011-06-16 ENCOUNTER — Encounter (HOSPITAL_COMMUNITY): Payer: Self-pay

## 2011-06-16 DIAGNOSIS — Z7901 Long term (current) use of anticoagulants: Secondary | ICD-10-CM | POA: Insufficient documentation

## 2011-06-16 DIAGNOSIS — M79609 Pain in unspecified limb: Secondary | ICD-10-CM | POA: Insufficient documentation

## 2011-06-16 DIAGNOSIS — Z87891 Personal history of nicotine dependence: Secondary | ICD-10-CM | POA: Insufficient documentation

## 2011-06-16 DIAGNOSIS — Z86718 Personal history of other venous thrombosis and embolism: Secondary | ICD-10-CM | POA: Insufficient documentation

## 2011-06-16 DIAGNOSIS — M79605 Pain in left leg: Secondary | ICD-10-CM

## 2011-06-16 DIAGNOSIS — Z8673 Personal history of transient ischemic attack (TIA), and cerebral infarction without residual deficits: Secondary | ICD-10-CM | POA: Insufficient documentation

## 2011-06-16 DIAGNOSIS — Z853 Personal history of malignant neoplasm of breast: Secondary | ICD-10-CM | POA: Insufficient documentation

## 2011-06-16 DIAGNOSIS — R233 Spontaneous ecchymoses: Secondary | ICD-10-CM | POA: Insufficient documentation

## 2011-06-16 DIAGNOSIS — Z85828 Personal history of other malignant neoplasm of skin: Secondary | ICD-10-CM | POA: Insufficient documentation

## 2011-06-16 NOTE — ED Notes (Signed)
Left lower shin red and warm to touch. Pt states symptoms started yesterday. C/o burning sensation to leg.

## 2011-06-16 NOTE — ED Notes (Signed)
MD at bedside. 

## 2011-06-16 NOTE — ED Notes (Signed)
Into room to see patient. Remains resting sitting up in bed. Equal chest rise and fall. Denies any shortness of breath or ever having shortness of breath. Call bell within reach. Denies any needs. Awaiting md evaluation. Family at bedside.

## 2011-06-16 NOTE — ED Provider Notes (Signed)
History   This chart was scribed for [name] Christen, MD by Carolyne Littles. The patient was seen in room APA04/APA04 and the patient's care was started at 11:28PM.   CSN: [id number] Arrival date & time: 06/16/2011  9:35 PM   First MD Initiated Contact with Patient 06/16/11 2313      Chief Complaint  Patient presents with  . Leg Swelling  . DVT   HPI [name] [name] is a 77 y.o. female who presents to the Emergency Department complaining of moderate area of redness to anterior aspect of left lower leg onset yesterday and persistent since with associated throbbing and burning discomfort and pruritus. Family members also note patient with subjective fever. Patient states associated pain with red area is not aggravated with walking. Denies nausea, vomiting, diarrhea, SOB, chest pain. Patient is currently on Plavix.   Past Medical History  Diagnosis Date  . History of blood clots     in leg  . History of knee surgery   . Mini stroke   . Hip fracture     hip surgery 2001  . Stomach ulcer     secondary to h.pylori, s/p treatment  . Thyroid condition   . Bladder infection     History  . Kidney infection     History  . Melanoma of thigh     left  . DCIS (ductal carcinoma in situ) of breast     RIGHT BREAST  . Aneurysm, thoracic aortic   . Dehydration     HISTORY   . S/P colonoscopy March 2010    RMR: friable anal canal hemorrhoids, hyperplastic ascending polyp, adenomatous descending polyp     Past Surgical History  Procedure Date  . Partial hysterectomy   . Leg surgery   . Breast lumpectomy   . Total hip arthroplasty 2001&2004     X 2 FOR LEFT HIP  . Appendectomy   . Cataract extraction, bilateral   . Knee surgery 05/2006    total right  . Melanoma excision     left leg excision  . Tonsillectomy   . Cholecystectomy   . Varicose vein surgery     Family History  Problem Relation Age of Onset  . Colon cancer Neg Hx     History  Substance Use Topics  . Smoking status:  Former Smoker -- 2.0 packs/day    Types: Cigarettes  . Smokeless tobacco: Former Systems developer    Quit date: 08/06/1983  . Alcohol Use: No    OB History    Grav Para Term Preterm Abortions TAB SAB Ect Mult Living                  Review of Systems 10 Systems reviewed and are negative for acute change except as noted in the HPI.  Allergies  Codeine; Iron; and Penicillins  Home Medications   Current Outpatient Rx  Name Route Sig Dispense Refill  . ACETAMINOPHEN ER 650 MG PO TBCR Oral Take 650 mg by mouth 4 (four) times daily as needed. pain    . BIS SUBCIT-METRONID-TETRACYC 140-125-125 MG PO CAPS Oral Take 3 capsules by mouth 4 (four) times daily -  before meals and at bedtime. For 10 days. 120 capsule 0  . CLOPIDOGREL BISULFATE 75 MG PO TABS Oral Take 75 mg by mouth daily.      . DEXLANSOPRAZOLE 60 MG PO CPDR Oral Take 60 mg by mouth 2 (two) times daily.      . MULTIVITAMINS PO CAPS Oral  Take 1 capsule by mouth daily.      Marland Kitchen PRESERVISION AREDS 2 PO CAPS Oral Take 1 each by mouth daily.     . SODIUM CHLORIDE (HYPERTONIC) 2 % OP SOLN Both Eyes Place 1 drop into both eyes 3 (three) times daily.     Marland Kitchen HYDROCORTISONE ACETATE 25 MG RE SUPP Rectal Place 1 suppository (25 mg total) rectally every 12 (twelve) hours. X 7 days 14 suppository 0    BP 149/59  Pulse 71  Temp(Src) 98.2 F (36.8 C) (Oral)  Resp 16  Ht 5\' 6"  (1.676 m)  Wt 192 lb (87.091 kg)  BMI 30.99 kg/m2  SpO2 93%  Physical Exam  Nursing note and vitals reviewed. Constitutional: She is oriented to person, place, and time. She appears well-developed and well-nourished. No distress.  HENT:  Head: Normocephalic and atraumatic.  Eyes: EOM are normal. Pupils are equal, round, and reactive to light.  Neck: Neck supple. No tracheal deviation present.  Cardiovascular: Normal rate.   Pulmonary/Chest: Effort normal. No respiratory distress.  Abdominal: She exhibits no distension.  Musculoskeletal: Normal range of motion. She  exhibits no edema.       Mild tenderness to left calf. Distal anterior tibia with an area of petechiae approximately 6 by 10cm.   Neurological: She is alert and oriented to person, place, and time. No sensory deficit.  Skin: Skin is warm and dry.  Psychiatric: She has a normal mood and affect. Her behavior is normal.    ED Course  Procedures (including critical care time)  DIAGNOSTIC STUDIES: Oxygen Saturation is 93% on room air, adequate by my interpretation.    COORDINATION OF CARE: 11:35PM- Initial plan- PTT. Lovenox Sub-Q. Doppler study for LLE.   Labs Reviewed - No data to display No results found.   No diagnosis found.    MDM  Patient has slight left calf tenderness. Also petechiae on distal anterior tibial area. Will give subcutaneous Lovenox and schedule Doppler study of left lower extremity and am.  No clinical evidence of cellulitis       I personally performed the services described in this documentation, which was scribed in my presence. The recorded information has been reviewed and considered.    [name] Christen, MD 06/17/11 [phone]

## 2011-06-16 NOTE — ED Notes (Signed)
Into room to assess patient. Patient sitting up in bed. No distress. Equal chest rise and fall, regular, nonlabored. Complaints of splotchy, nonraised rash to left lower extremity. Complaints of swelling although none noted by this nurse. Is warn to touch. Measured calf circumference: Right calf 44 cm, Left calf 43.5. Tenderness upon palpation. Negative homan's sign. States she applied ice prior to arrival which helped pain. States pain is a constant throbbing, burning sensation. Is nonblanching. Noticed rash got worse yesterday. Strong palpable petal pulses. Cap refill <3. Family with patient. Denies any needs. Call bell within reach.

## 2011-06-16 NOTE — ED Notes (Signed)
Into room to see patient. Resting sitting up in bed. Denies any needs. Notified next to be seen. Verbalized understanding. Call bell within reach. Family with patient. Unable to place number on pain. Will continue to monitor.

## 2011-06-16 NOTE — ED Notes (Signed)
Remains resting in bed on back, sitting up. Denies any needs at this time. Family at bedside. No distress. Call bell within reach. Awaiting MD eval.

## 2011-06-17 ENCOUNTER — Ambulatory Visit (HOSPITAL_COMMUNITY)
Admit: 2011-06-17 | Discharge: 2011-06-17 | Disposition: A | Discharge status: Home / Self Care | Payer: Medicare Other | Source: Ambulatory Visit | Attending: Emergency Medicine | Admitting: Emergency Medicine

## 2011-06-17 DIAGNOSIS — M79609 Pain in unspecified limb: Secondary | ICD-10-CM | POA: Insufficient documentation

## 2011-06-17 DIAGNOSIS — M7989 Other specified soft tissue disorders: Secondary | ICD-10-CM | POA: Insufficient documentation

## 2011-06-17 LAB — CBC
HCT: 37.1 % (ref 36.0–46.0)
Hemoglobin: 12.3 g/dL (ref 12.0–15.0)
MCH: 31.1 pg (ref 26.0–34.0)
MCHC: 33.2 g/dL (ref 30.0–36.0)
MCV: 93.9 fL (ref 78.0–100.0)
RBC: 3.95 MIL/uL (ref 3.87–5.11)

## 2011-06-17 LAB — DIFFERENTIAL
Basophils Relative: 0 % (ref 0–1)
Eosinophils Absolute: 0.3 10*3/uL (ref 0.0–0.7)
Lymphs Abs: 1.3 10*3/uL (ref 0.7–4.0)
Monocytes Absolute: 0.6 10*3/uL (ref 0.1–1.0)
Monocytes Relative: 12 % (ref 3–12)
Neutrophils Relative %: 54 % (ref 43–77)

## 2011-06-17 MED ORDER — ENOXAPARIN SODIUM 80 MG/0.8ML ~~LOC~~ SOLN
110.0000 mg | Freq: Once | SUBCUTANEOUS | Status: AC
  Administered 2011-06-17: 110 mg via SUBCUTANEOUS
  Filled 2011-06-17: qty 1.6

## 2011-06-17 NOTE — ED Notes (Signed)
Remains resting sitting up in chair. Denies any needs at this time. No distress. Call bell within reach. Family remains at bedside. Patient states she is "having a little pain" but nothing major. Unable to put number on pain. Notified awaiting blood work. Verbalized understanding.  Will continue to monitor.

## 2011-06-17 NOTE — ED Notes (Signed)
Remains resting in bed sitting up. No distress. Call bell within reach. Denies any needs. Family with patient.

## 2011-06-17 NOTE — ED Notes (Signed)
Medicated per MD order. Tolerated well. Sitting up to bedside in chair. Denies any needs. No shortness of breath. Call bell within reach. Family at bedside.

## 2011-06-17 NOTE — ED Provider Notes (Signed)
1:39 PM The patient returns from her recent ED visit for an ultrasound venous imaging of the left lower extremity to exclude blood clot/DVT. I reviewed the imaging results which are negative for DVT and are normal study, and I have reviewed these results with the patient. The patient states her understanding of the test results.  Charlena Cross, MD 06/17/11 1340

## 2011-06-17 NOTE — ED Notes (Signed)
Labs drawn from right ac x 1 attempt by this nurse. Patient tolerated well. Denies any needs. No distress. Call bell within reach.

## 2011-06-18 ENCOUNTER — Telehealth: Payer: Self-pay | Admitting: Gastroenterology

## 2011-06-18 NOTE — Telephone Encounter (Signed)
pts daughter called- Ms Lego has been sick on her stomach since starting the [place]- they would like to know if she should stop taking them- Please call her daughter Joaquim Lai back @ [phone]

## 2011-06-18 NOTE — Telephone Encounter (Signed)
Spoke with Joaquim Lai- pts daughter- pt has been taking meds for 5 days, she started them on Friday but has not taken any today. She was vomiting but she refuses to eat anything now so she just has the "dry heaves". Pt is weak and is refusing to take any more of the pylera. Please advise.

## 2011-06-19 ENCOUNTER — Other Ambulatory Visit (HOSPITAL_COMMUNITY): Payer: Self-pay | Admitting: Internal Medicine

## 2011-06-19 DIAGNOSIS — I719 Aortic aneurysm of unspecified site, without rupture: Secondary | ICD-10-CM

## 2011-06-19 NOTE — Telephone Encounter (Signed)
Let's make sure she follows a bland diet, sip fluids to avoid dehydration. Stop Pylera for now. Contact us if further issues. If unable to tolerate po, seek medical attention.

## 2011-06-19 NOTE — Telephone Encounter (Signed)
pts daughter aware 

## 2011-06-22 ENCOUNTER — Encounter: Payer: Self-pay | Admitting: Internal Medicine

## 2011-06-23 ENCOUNTER — Other Ambulatory Visit (HOSPITAL_COMMUNITY): Payer: Medicare Other

## 2011-07-01 ENCOUNTER — Ambulatory Visit (HOSPITAL_COMMUNITY)
Admission: RE | Admit: 2011-07-01 | Discharge: 2011-07-01 | Disposition: A | Discharge status: Home / Self Care | Payer: Medicare Other | Source: Ambulatory Visit | Attending: Internal Medicine | Admitting: Internal Medicine

## 2011-07-01 DIAGNOSIS — I719 Aortic aneurysm of unspecified site, without rupture: Secondary | ICD-10-CM

## 2011-07-01 DIAGNOSIS — I712 Thoracic aortic aneurysm, without rupture, unspecified: Secondary | ICD-10-CM | POA: Insufficient documentation

## 2011-07-01 DIAGNOSIS — R079 Chest pain, unspecified: Secondary | ICD-10-CM | POA: Insufficient documentation

## 2011-07-01 MED ORDER — IOHEXOL 350 MG/ML SOLN
100.0000 mL | Freq: Once | INTRAVENOUS | Status: AC | PRN
  Administered 2011-07-01: 100 mL via INTRAVENOUS

## 2011-07-10 ENCOUNTER — Encounter: Payer: Self-pay | Admitting: Internal Medicine

## 2011-07-11 ENCOUNTER — Other Ambulatory Visit: Payer: Self-pay

## 2011-07-11 NOTE — Telephone Encounter (Signed)
Please call pt. Is she wanting to restart pylera?

## 2011-07-14 ENCOUNTER — Encounter: Payer: Self-pay | Admitting: Gastroenterology

## 2011-07-14 ENCOUNTER — Ambulatory Visit (INDEPENDENT_AMBULATORY_CARE_PROVIDER_SITE_OTHER): Payer: Medicare Other | Admitting: Gastroenterology

## 2011-07-14 VITALS — BP 149/68 | HR 73 | Temp 97.6°F | Ht 66.0 in | Wt 189.8 lb

## 2011-07-14 DIAGNOSIS — A048 Other specified bacterial intestinal infections: Secondary | ICD-10-CM

## 2011-07-14 DIAGNOSIS — D649 Anemia, unspecified: Secondary | ICD-10-CM

## 2011-07-14 DIAGNOSIS — B9681 Helicobacter pylori [H. pylori] as the cause of diseases classified elsewhere: Secondary | ICD-10-CM | POA: Insufficient documentation

## 2011-07-14 DIAGNOSIS — K2970 Gastritis, unspecified, without bleeding: Secondary | ICD-10-CM

## 2011-07-14 NOTE — Progress Notes (Signed)
Referring Provider: [name], MD Primary Care Physician:  [name], MD Primary Gastroenterologist: Dr. Gala Romney   Chief Complaint  Patient presents with  . Follow-up    HPI:   [name] presents today in f/u after EGD and colonoscopy by Dr. Gala Romney secondary to refractory IDA. Path and procedures outlined in [facility]; she was noted to have H.pylori gastritis on path from EGD. She was given Pylera, as she has had H.pylori infection before and treated with clarithromycin. She tolerated this for about 5 days but was unable to complete due to N/V. She continues on Dexilant.  She denies any N/V, abdominal pain. Denies reflux or dysphagia. Notes that dairy products cause occasional diarrhea. She is not following a high fiber diet. She has had no evidence of melena or hematochezia.  Past Medical History  Diagnosis Date  . History of blood clots     in leg  . History of knee surgery   . Mini stroke   . Hip fracture     hip surgery 2001  . Stomach ulcer     secondary to h.pylori, s/p treatment  . Thyroid condition   . Bladder infection     History  . Kidney infection     History  . Melanoma of thigh     left  . DCIS (ductal carcinoma in situ) of breast     RIGHT BREAST  . Aneurysm, thoracic aortic   . Dehydration     HISTORY   . S/P colonoscopy March 2010    RMR: friable anal canal hemorrhoids, hyperplastic ascending polyp, adenomatous descending polyp     Past Surgical History  Procedure Date  . Partial hysterectomy   . Leg surgery   . Breast lumpectomy   . Total hip arthroplasty 2001&2004     X 2 FOR LEFT HIP  . Appendectomy   . Cataract extraction, bilateral   . Knee surgery 05/2006    total right  . Melanoma excision     left leg excision  . Tonsillectomy   . Cholecystectomy   . Varicose vein surgery   . Esophagogastroduodenoscopy 06/05/11    small hiatal hernia; + H.PYLORI GASTRITIS, s/p 5 days of Pylera, unable to finish due to N/V  . Colonoscopy  06/05/11    pancolonic diverticulosis/ileal reosion/abnormal anorectal junction s/p biopsy: path for small intestine and Ti was benign with non-villous atrophy, rectal biopsy with prominent prolapse changes, no acute inflammation    Current Outpatient Prescriptions  Medication Sig Dispense Refill  . acetaminophen (ARTHRITIS PAIN RELIEF) 650 MG CR tablet Take 650 mg by mouth 4 (four) times daily as needed. pain      . clopidogrel (PLAVIX) 75 MG tablet Take 75 mg by mouth daily.        Marland Kitchen dexlansoprazole (DEXILANT) 60 MG capsule Take 60 mg by mouth 2 (two) times daily.        . hydrocortisone (ANUSOL-HC) 25 MG suppository Place 1 suppository (25 mg total) rectally every 12 (twelve) hours. X 7 days  14 suppository  0  . Multiple Vitamin (MULTIVITAMIN) capsule Take 1 capsule by mouth daily.        . Multiple Vitamins-Minerals (PRESERVISION AREDS 2) CAPS Take 1 each by mouth daily.       . potassium chloride (K-DUR) 10 MEQ tablet Take 10 mEq by mouth 2 (two) times daily.        . sodium chloride (MURO 128) 2 % ophthalmic solution Place 1 drop into both eyes  3 (three) times daily.         Allergies as of 07/14/2011 - Review Complete 07/14/2011  Allergen Reaction Noted  . Codeine Diarrhea   . Iron Nausea And Vomiting 02/01/2011  . Penicillins Rash     Family History  Problem Relation Age of Onset  . Colon cancer Neg Hx     History   Social History  . Marital Status: Widowed    Spouse Name: N/A    Number of Children: N/A  . Years of Education: N/A   Social History Main Topics  . Smoking status: Former Smoker -- 2.0 packs/day    Types: Cigarettes  . Smokeless tobacco: Former Systems developer    Quit date: 08/06/1983  . Alcohol Use: No  . Drug Use: No  . Sexually Active: None   Other Topics Concern  . None   Social History Narrative  . None    Review of Systems: Gen: Denies fever, chills, anorexia. Denies fatigue, weakness, weight loss.  CV: Denies chest pain, palpitations, syncope,  peripheral edema, and claudication. Resp: Denies dyspnea at rest, cough, wheezing, coughing up blood, and pleurisy. GI: Denies vomiting blood, jaundice, and fecal incontinence.   Denies dysphagia or odynophagia. Derm: Denies rash, itching, dry skin Psych: Denies depression, anxiety, memory loss, confusion. No homicidal or suicidal ideation.  Heme: Denies bruising, bleeding, and enlarged lymph nodes.  Physical Exam: BP 149/68  Pulse 73  Temp(Src) 97.6 F (36.4 C) (Temporal)  Ht 5\' 6"  (1.676 m)  Wt 189 lb 12.8 oz (86.093 kg)  BMI 30.63 kg/m2 General:   Alert and oriented. No distress noted. Pleasant and cooperative.  Head:  Normocephalic and atraumatic. Eyes:  Conjuctiva clear without scleral icterus. Mouth:  Oral mucosa pink and moist. Good dentition. No lesions. Heart:  S1, S2 present without murmurs, rubs, or gallops. Regular rate and rhythm. Abdomen:  +BS, soft, non-tender and non-distended. No rebound or guarding. No HSM or masses noted. Msk:  Symmetrical without gross deformities. Normal posture. Extremities:  Without edema. Neurologic:  Alert and  oriented x4;  grossly normal neurologically. Skin:  Intact without significant lesions or rashes. Cervical Nodes:  No significant cervical adenopathy. Psych:  Alert and cooperative. Normal mood and affect.

## 2011-07-14 NOTE — Patient Instructions (Signed)
We would like you to get a stool sample to check for any further evidence of the bacteria. You will need to stop Dexilant for 2 weeks prior to getting this sample.   Once you have completed this, restart the Dexilant.   We will see you back in 3 mos or sooner if needed.  Try to follow a high fiber diet!

## 2011-07-14 NOTE — Assessment & Plan Note (Signed)
77 year old with noted H.pylori gastritis on path, s/p only 5 days of Pylera secondary to N/V at 5th day. Continues on Dexilant. Asymptomatic at present. No other issues.  Will obtain H.pylori stool antigen to assess for eradication Hold Dexilant for 2 weeks prior to antigen, then resume Follow-up in 3 mos

## 2011-07-14 NOTE — Assessment & Plan Note (Signed)
IDA, H.pylori noted on path, likely contributor. EGD and colonoscopy on file. No further work-up at this time. 3 mos f/u.

## 2011-07-15 NOTE — Progress Notes (Signed)
Cc to PCP 

## 2011-07-17 NOTE — Progress Notes (Signed)
REVIEWED.  

## 2011-07-21 ENCOUNTER — Other Ambulatory Visit (HOSPITAL_COMMUNITY): Payer: Medicare Other

## 2011-08-02 LAB — HELICOBACTER PYLORI  SPECIAL ANTIGEN

## 2011-08-05 HISTORY — PX: CORNEAL TRANSPLANT: SHX108

## 2011-08-06 ENCOUNTER — Other Ambulatory Visit (HOSPITAL_COMMUNITY): Payer: Self-pay | Admitting: Family Medicine

## 2011-08-06 DIAGNOSIS — Z139 Encounter for screening, unspecified: Secondary | ICD-10-CM

## 2011-08-11 ENCOUNTER — Ambulatory Visit (HOSPITAL_COMMUNITY): Payer: Medicare Other | Admitting: Oncology

## 2011-08-12 NOTE — Progress Notes (Signed)
Quick Note:  Pt aware, please nic ov in 3 months. ______

## 2011-08-12 NOTE — Progress Notes (Signed)
Quick Note:  Negative for H.pylori. Please continue Dexilant, f/u as planned in 3 mos. ______

## 2011-08-13 NOTE — Progress Notes (Signed)
Reminder in epic to follow up in 3 months °

## 2011-08-14 ENCOUNTER — Ambulatory Visit (INDEPENDENT_AMBULATORY_CARE_PROVIDER_SITE_OTHER): Payer: Medicare Other | Admitting: Orthopedic Surgery

## 2011-08-14 ENCOUNTER — Encounter: Payer: Self-pay | Admitting: Orthopedic Surgery

## 2011-08-14 VITALS — BP 126/70 | Ht 66.0 in | Wt 189.0 lb

## 2011-08-14 DIAGNOSIS — Z96659 Presence of unspecified artificial knee joint: Secondary | ICD-10-CM | POA: Insufficient documentation

## 2011-08-14 NOTE — Patient Instructions (Signed)
Come back in 2 years

## 2011-08-14 NOTE — Progress Notes (Signed)
Patient ID: [name] [name], female   DOB: 02/07/1923, 78 y.o.   MRN: [id number] Chief Complaint  Patient presents with  . Follow-up    1 year recheck on right TKA with xray. DOS 05-19-06.    Chief complaint total knee follow-up.  History this is a follow-up visit. Status post RIGHT total knee replacement.  Implant DePuy  Review of systems patient has no complaints.  Exam Physical Exam(6) GENERAL: normal development   CDV: pulses are normal   Skin: normal  Psychiatric: awake, alert and oriented  Neuro: normal sensation  1 ambulation Ambulates without a cane except for house 2 ROM = 125 3 Motor normal  4 Stability normal   Separate x-ray report.  Reason for x-ray, and we'll x-ray follow-up knee replacement.  3 views RIGHT knee.  The implant is aligned normally. There is no loosening.  Impression normal appearing knee replacement.    Assessment: Knee replacement functioning well    Plan: 2 year follow

## 2011-08-29 ENCOUNTER — Ambulatory Visit (HOSPITAL_COMMUNITY): Payer: Medicare Other

## 2011-09-02 ENCOUNTER — Ambulatory Visit (HOSPITAL_COMMUNITY)
Admission: RE | Admit: 2011-09-02 | Discharge: 2011-09-02 | Disposition: A | Discharge status: Home / Self Care | Payer: Medicare Other | Source: Ambulatory Visit | Attending: Family Medicine | Admitting: Family Medicine

## 2011-09-02 DIAGNOSIS — Z139 Encounter for screening, unspecified: Secondary | ICD-10-CM

## 2011-09-02 DIAGNOSIS — Z1231 Encounter for screening mammogram for malignant neoplasm of breast: Secondary | ICD-10-CM | POA: Diagnosis not present

## 2011-09-03 ENCOUNTER — Encounter (HOSPITAL_COMMUNITY): Payer: Self-pay | Admitting: Oncology

## 2011-09-03 ENCOUNTER — Encounter (HOSPITAL_COMMUNITY): Payer: Medicare Other | Attending: Oncology | Admitting: Oncology

## 2011-09-03 VITALS — BP 160/66 | HR 72 | Temp 97.5°F | Wt 195.2 lb

## 2011-09-03 DIAGNOSIS — D649 Anemia, unspecified: Secondary | ICD-10-CM

## 2011-09-03 DIAGNOSIS — Z832 Family history of diseases of the blood and blood-forming organs and certain disorders involving the immune mechanism: Secondary | ICD-10-CM

## 2011-09-03 DIAGNOSIS — K625 Hemorrhage of anus and rectum: Secondary | ICD-10-CM | POA: Diagnosis not present

## 2011-09-03 DIAGNOSIS — Z853 Personal history of malignant neoplasm of breast: Secondary | ICD-10-CM

## 2011-09-03 DIAGNOSIS — C4370 Malignant melanoma of unspecified lower limb, including hip: Secondary | ICD-10-CM | POA: Diagnosis not present

## 2011-09-03 LAB — CBC
MCH: 30.4 pg (ref 26.0–34.0)
MCHC: 33.1 g/dL (ref 30.0–36.0)
MCV: 91.9 fL (ref 78.0–100.0)
Platelets: 156 10*3/uL (ref 150–400)
RDW: 14.1 % (ref 11.5–15.5)

## 2011-09-03 LAB — COMPREHENSIVE METABOLIC PANEL
AST: 18 U/L (ref 0–37)
Albumin: 3.7 g/dL (ref 3.5–5.2)
CO2: 25 mEq/L (ref 19–32)
Calcium: 9.2 mg/dL (ref 8.4–10.5)
Creatinine, Ser: 0.71 mg/dL (ref 0.50–1.10)
GFR calc non Af Amer: 75 mL/min — ABNORMAL LOW (ref >90)

## 2011-09-03 NOTE — Progress Notes (Signed)
[name] [name] presented for [employer]. Labs per MD order drawn via Peripheral Line 23 gauge needle inserted in right antecubital.  Good blood return present. Procedure without incident.  Needle removed intact. Patient tolerated procedure well.

## 2011-09-03 NOTE — Progress Notes (Signed)
CC:   [name], M.D. Bridgette Habermann, MD FACP [facility] Felicie Morn, M.D.  DIAGNOSES: 1. Stage I (T1a) melanoma of the left thigh, superficial spreading     type thus far without recurrence after resection December 2007 and     re-resection on 08/06/2006.  She had negative margins at that time. 2. Iron-deficiency anemia with gastrointestinal blood losses and     colonoscopy revealing friable anal canal hemorrhoids and     diverticula, nothing else pressing, and that was in March 2010 by     Dr. Gala Romney. 3. Ductal carcinoma in situ of the right breast in June 1998, status     post surgical resection. 4. Cholecystectomy in 1995. 5. Hysterectomy in 1976. 6. Right knee replaced in 2007 by Dr. Arther Abbott. 7. Left hip replacement x2 by Dr. Laurence Slate. 8. Varicose veins bilaterally. 9. Bilateral cataract operations. 10.Penicillin allergy, which gives her rash.  HISTORY:  Deshona is here today and labs are going to be drawn today.  She received her last iron infusion in late June 2012.  The one prior to that was February 2012.  On 02/03/2011 after the infusion, her ferritin was 736, a month later it was 374, in September it was 134, in October 93, and today's results are pending.  In November she had minimal thrombocytopenia with a platelet count of 134,000, which was essentially unusual for her.  I have not seen one before that that was low on her, but we will see what this one from today.  She feels good.  She wanted me to look at a skin lesion on the base of the neck and a little lesion on the left upper back.  The left upper back lesion has a slightly irregular crusty area on top and is not necessarily raised.  It is almost as if it is in a little dimple and then around the base is a very slight area crater-like, slightly pearly borders, but not classic.  She is going to see Dr. Romona Curls anyway and let him look at these and perhaps excise them.  I am not that  concerned about them, but she wants to see.  The one on the base of the neck is about a centimeter across, looks like a seborrheic keratosis to me more than anything.  PHYSICAL EXAMINATION:  Lungs:  Clear.  Heart:  Shows a regular rhythm and rate.  Breast Exam:  Shows prior scars.  No masses.  Abdomen:  Soft and nontender without organomegaly.  Lymph:  She has no inguinal nodes, no axillary nodes, etc.  She has no cervical nodes.  Extremities:  The left thigh resection area is clear.  No nodularity and again no adenopathy in the inguinal area either.  So Athalia needs to have blood work today to see where we stand with her iron studies and if her iron is back down to where she needs iron, we will set that up, but I may discuss her case again with Dr. Gala Romney to see if anything can be done.  In talking to her, she sees blood usually on the paper, but sometimes in the toilet every other day.  She rarely goes more than 2 days without seeing blood and the other day she passed a very dark clot that was about 5 inches long and thinner than a pencil.  So we will be in touch with her once we get the results back.  We will otherwise see her in 6 months.  Her daughter has von Willebrand's disease and I am wondering if [place] has it as well.  Her daughter was diagnosed by Dr Gaylyn Cheers at [facility] years ago.  [place] is agreeable to testing and we'll send that off.    ______________________________ Gaston Islam. Tressie Stalker, MD ESN/MEDQ  D:  09/03/2011  T:  09/03/2011  Job:  [id number]

## 2011-09-03 NOTE — Progress Notes (Signed)
This office note has been dictated.

## 2011-09-03 NOTE — Patient Instructions (Signed)
[facility]  Discharge Instructions  RECOMMENDATIONS MADE BY THE CONSULTANT AND ANY TEST RESULTS WILL BE SENT TO YOUR REFERRING DOCTOR.   EXAM FINDINGS BY MD TODAY AND SIGNS AND SYMPTOMS TO REPORT TO CLINIC OR PRIMARY MD: We will do some lab work today. If you need to take iron infusions again we will let you know. Return to clinic in 6 months to see MD. Report any problems or concerns to this clinic as needed.    I acknowledge that I have been informed and understand all the instructions given to me and received a copy. I do not have any more questions at this time, but understand that I may call the [facility] at [facility] at [phone] during business hours should I have any further questions or need assistance in obtaining follow-up care.    __________________________________________  _____________  __________ Signature of Patient or Authorized Representative            Date                   Time    __________________________________________ Nurse's Signature

## 2011-09-04 LAB — IRON AND TIBC
Iron: 81 ug/dL (ref 42–135)
TIBC: 340 ug/dL (ref 250–470)

## 2011-09-08 ENCOUNTER — Other Ambulatory Visit: Payer: Self-pay | Admitting: General Surgery

## 2011-09-08 ENCOUNTER — Other Ambulatory Visit (HOSPITAL_COMMUNITY)
Admission: RE | Admit: 2011-09-08 | Discharge: 2011-09-08 | Disposition: A | Discharge status: Home / Self Care | Payer: Medicare Other | Source: Ambulatory Visit | Attending: General Surgery | Admitting: General Surgery

## 2011-09-08 DIAGNOSIS — L989 Disorder of the skin and subcutaneous tissue, unspecified: Secondary | ICD-10-CM | POA: Diagnosis not present

## 2011-09-08 DIAGNOSIS — D045 Carcinoma in situ of skin of trunk: Secondary | ICD-10-CM | POA: Diagnosis not present

## 2011-09-09 DIAGNOSIS — D680 Von Willebrand's disease: Secondary | ICD-10-CM | POA: Diagnosis not present

## 2011-09-09 NOTE — Op Note (Signed)
NAME:  [name] [name], [name] [name] NO.:  MEDICAL RECORD NO.:  [id number]  LOCATION:                                 FACILITY:  PHYSICIAN:  Felicie Morn, M.D. DATE OF BIRTH:  1923-08-01  DATE OF PROCEDURE: DATE OF DISCHARGE:                              OPERATIVE REPORT   Dear Dr Tressie Stalker:  I saw [name] in my office on September 08, 2011, at which time I excised a hypopigmented lesion in the area of her manubrium on her chest wall.  She did not have any satellite lesions or any suspicious adenopathy.  I was clinically not too concerned of this; however, she did have in the past a history of superficial spreading melanoma, so I would like to air on the side of caution.  I will make sure you get a copy of the pathology reports for your record, and I thank you kindly for sending her my way.     Felicie Morn, M.D.     WB/MEDQ  D:  09/08/2011  T:  09/08/2011  Job:  [id number]  cc:   Gaston Islam. Tressie Stalker, MD Fax: [phone]

## 2011-09-22 ENCOUNTER — Other Ambulatory Visit: Payer: Self-pay

## 2011-09-22 DIAGNOSIS — D649 Anemia, unspecified: Secondary | ICD-10-CM

## 2011-09-22 DIAGNOSIS — D045 Carcinoma in situ of skin of trunk: Secondary | ICD-10-CM | POA: Diagnosis not present

## 2011-09-23 NOTE — Telephone Encounter (Signed)
LMOM to call back

## 2011-09-30 NOTE — Telephone Encounter (Signed)
Pt's hemorrhoids are bleeding and she would like to have some more suppository

## 2011-10-13 LAB — VON WILLEBRAND FACTOR MULTIMER

## 2011-10-13 MED ORDER — HYDROCORTISONE ACETATE 25 MG RE SUPP: 25.0000 mg | Freq: Two times a day (BID) | RECTAL | Status: DC

## 2011-10-13 NOTE — Telephone Encounter (Signed)
Will provide refill. Looks like she is supposed to see [place] this month. Let's make an appt for her.

## 2011-10-13 NOTE — Telephone Encounter (Signed)
She is doing ok at this time and does not want to make an appointment at this time.

## 2011-10-13 NOTE — Telephone Encounter (Signed)
Addended by: Orvil Feil on: 10/13/2011 12:21 PM   Modules accepted: Orders

## 2011-10-20 DIAGNOSIS — I10 Essential (primary) hypertension: Secondary | ICD-10-CM | POA: Diagnosis not present

## 2011-10-23 ENCOUNTER — Encounter: Payer: Self-pay | Admitting: Internal Medicine

## 2011-10-30 DIAGNOSIS — I38 Endocarditis, valve unspecified: Secondary | ICD-10-CM | POA: Diagnosis not present

## 2011-10-30 DIAGNOSIS — H18559 Macular corneal dystrophy, unspecified eye: Secondary | ICD-10-CM | POA: Diagnosis not present

## 2011-11-03 ENCOUNTER — Encounter: Payer: Self-pay | Admitting: Oncology

## 2011-11-11 DIAGNOSIS — Z6831 Body mass index (BMI) 31.0-31.9, adult: Secondary | ICD-10-CM | POA: Diagnosis not present

## 2011-11-11 DIAGNOSIS — E669 Obesity, unspecified: Secondary | ICD-10-CM | POA: Diagnosis not present

## 2011-11-11 DIAGNOSIS — Z79899 Other long term (current) drug therapy: Secondary | ICD-10-CM | POA: Diagnosis not present

## 2011-11-11 DIAGNOSIS — K219 Gastro-esophageal reflux disease without esophagitis: Secondary | ICD-10-CM | POA: Diagnosis not present

## 2011-11-11 DIAGNOSIS — M1610 Unilateral primary osteoarthritis, unspecified hip: Secondary | ICD-10-CM | POA: Diagnosis not present

## 2011-11-11 DIAGNOSIS — I10 Essential (primary) hypertension: Secondary | ICD-10-CM | POA: Diagnosis not present

## 2011-11-11 DIAGNOSIS — Z8673 Personal history of transient ischemic attack (TIA), and cerebral infarction without residual deficits: Secondary | ICD-10-CM | POA: Diagnosis not present

## 2011-11-11 DIAGNOSIS — E876 Hypokalemia: Secondary | ICD-10-CM | POA: Diagnosis not present

## 2011-11-11 DIAGNOSIS — Z7902 Long term (current) use of antithrombotics/antiplatelets: Secondary | ICD-10-CM | POA: Diagnosis not present

## 2011-12-03 DIAGNOSIS — Z6838 Body mass index (BMI) 38.0-38.9, adult: Secondary | ICD-10-CM | POA: Diagnosis not present

## 2011-12-03 DIAGNOSIS — M546 Pain in thoracic spine: Secondary | ICD-10-CM | POA: Diagnosis not present

## 2011-12-09 ENCOUNTER — Telehealth: Payer: Self-pay | Admitting: Orthopedic Surgery

## 2011-12-09 ENCOUNTER — Other Ambulatory Visit (HOSPITAL_COMMUNITY): Payer: Self-pay | Admitting: Physician Assistant

## 2011-12-09 ENCOUNTER — Ambulatory Visit (HOSPITAL_COMMUNITY)
Admission: RE | Admit: 2011-12-09 | Discharge: 2011-12-09 | Disposition: A | Discharge status: Home / Self Care | Payer: Medicare Other | Source: Ambulatory Visit | Attending: Physician Assistant | Admitting: Physician Assistant

## 2011-12-09 DIAGNOSIS — M25569 Pain in unspecified knee: Secondary | ICD-10-CM | POA: Insufficient documentation

## 2011-12-09 DIAGNOSIS — Z96659 Presence of unspecified artificial knee joint: Secondary | ICD-10-CM | POA: Diagnosis not present

## 2011-12-09 DIAGNOSIS — S8000XA Contusion of unspecified knee, initial encounter: Secondary | ICD-10-CM

## 2011-12-09 DIAGNOSIS — M25869 Other specified joint disorders, unspecified knee: Secondary | ICD-10-CM | POA: Diagnosis not present

## 2011-12-09 DIAGNOSIS — Z6838 Body mass index (BMI) 38.0-38.9, adult: Secondary | ICD-10-CM | POA: Diagnosis not present

## 2011-12-09 DIAGNOSIS — M899 Disorder of bone, unspecified: Secondary | ICD-10-CM | POA: Diagnosis not present

## 2011-12-09 DIAGNOSIS — W19XXXA Unspecified fall, initial encounter: Secondary | ICD-10-CM | POA: Insufficient documentation

## 2011-12-09 NOTE — Telephone Encounter (Signed)
Patient's daughter called to relay that patient had a fall yesterday 12/08/11, thinks mainly onto left knee (opposite knee than the "total knee").  Requested appointment for evaluation and work-up.  Recommended emergency room, as Dr. Aline Brochure is out of office, and best to Emergency room for complete evaluation for injuries related to fall.  Advised we can see her in office following.

## 2011-12-11 DIAGNOSIS — R609 Edema, unspecified: Secondary | ICD-10-CM | POA: Diagnosis not present

## 2011-12-11 DIAGNOSIS — I712 Thoracic aortic aneurysm, without rupture: Secondary | ICD-10-CM | POA: Diagnosis not present

## 2012-02-25 DIAGNOSIS — Z6838 Body mass index (BMI) 38.0-38.9, adult: Secondary | ICD-10-CM | POA: Diagnosis not present

## 2012-02-25 DIAGNOSIS — Z713 Dietary counseling and surveillance: Secondary | ICD-10-CM | POA: Diagnosis not present

## 2012-02-25 DIAGNOSIS — Z7182 Exercise counseling: Secondary | ICD-10-CM | POA: Diagnosis not present

## 2012-02-25 DIAGNOSIS — N390 Urinary tract infection, site not specified: Secondary | ICD-10-CM | POA: Diagnosis not present

## 2012-03-01 DIAGNOSIS — Z947 Corneal transplant status: Secondary | ICD-10-CM | POA: Diagnosis not present

## 2012-03-02 ENCOUNTER — Ambulatory Visit (HOSPITAL_COMMUNITY)
Admission: RE | Admit: 2012-03-02 | Discharge: 2012-03-02 | Disposition: A | Discharge status: Home / Self Care | Payer: Medicare Other | Source: Ambulatory Visit | Attending: Oncology | Admitting: Oncology

## 2012-03-02 ENCOUNTER — Encounter (HOSPITAL_COMMUNITY): Payer: Medicare Other | Attending: Oncology | Admitting: Oncology

## 2012-03-02 ENCOUNTER — Encounter (HOSPITAL_COMMUNITY): Payer: Self-pay | Admitting: Oncology

## 2012-03-02 VITALS — BP 147/73 | HR 73 | Temp 97.8°F | Wt 196.5 lb

## 2012-03-02 DIAGNOSIS — M546 Pain in thoracic spine: Secondary | ICD-10-CM | POA: Diagnosis not present

## 2012-03-02 DIAGNOSIS — Z09 Encounter for follow-up examination after completed treatment for conditions other than malignant neoplasm: Secondary | ICD-10-CM | POA: Diagnosis not present

## 2012-03-02 DIAGNOSIS — C50919 Malignant neoplasm of unspecified site of unspecified female breast: Secondary | ICD-10-CM | POA: Diagnosis not present

## 2012-03-02 DIAGNOSIS — D649 Anemia, unspecified: Secondary | ICD-10-CM | POA: Diagnosis not present

## 2012-03-02 DIAGNOSIS — J4489 Other specified chronic obstructive pulmonary disease: Secondary | ICD-10-CM | POA: Insufficient documentation

## 2012-03-02 DIAGNOSIS — J449 Chronic obstructive pulmonary disease, unspecified: Secondary | ICD-10-CM | POA: Diagnosis not present

## 2012-03-02 DIAGNOSIS — C4370 Malignant melanoma of unspecified lower limb, including hip: Secondary | ICD-10-CM

## 2012-03-02 DIAGNOSIS — Z85820 Personal history of malignant melanoma of skin: Secondary | ICD-10-CM | POA: Diagnosis not present

## 2012-03-02 DIAGNOSIS — Z96649 Presence of unspecified artificial hip joint: Secondary | ICD-10-CM | POA: Diagnosis not present

## 2012-03-02 DIAGNOSIS — M47814 Spondylosis without myelopathy or radiculopathy, thoracic region: Secondary | ICD-10-CM | POA: Insufficient documentation

## 2012-03-02 DIAGNOSIS — I8390 Asymptomatic varicose veins of unspecified lower extremity: Secondary | ICD-10-CM | POA: Diagnosis not present

## 2012-03-02 DIAGNOSIS — M479 Spondylosis, unspecified: Secondary | ICD-10-CM | POA: Insufficient documentation

## 2012-03-02 DIAGNOSIS — Z87898 Personal history of other specified conditions: Secondary | ICD-10-CM | POA: Diagnosis not present

## 2012-03-02 DIAGNOSIS — R079 Chest pain, unspecified: Secondary | ICD-10-CM | POA: Diagnosis not present

## 2012-03-02 DIAGNOSIS — Z96659 Presence of unspecified artificial knee joint: Secondary | ICD-10-CM | POA: Insufficient documentation

## 2012-03-02 LAB — COMPREHENSIVE METABOLIC PANEL WITH GFR
ALT: 10 U/L (ref 0–35)
AST: 21 U/L (ref 0–37)
Albumin: 3.9 g/dL (ref 3.5–5.2)
Alkaline Phosphatase: 75 U/L (ref 39–117)
BUN: 16 mg/dL (ref 6–23)
CO2: 27 meq/L (ref 19–32)
Calcium: 9 mg/dL (ref 8.4–10.5)
Chloride: 106 meq/L (ref 96–112)
Creatinine, Ser: 0.81 mg/dL (ref 0.50–1.10)
GFR calc Af Amer: 74 mL/min — ABNORMAL LOW
GFR calc non Af Amer: 63 mL/min — ABNORMAL LOW
Glucose, Bld: 102 mg/dL — ABNORMAL HIGH (ref 70–99)
Potassium: 3.5 meq/L (ref 3.5–5.1)
Sodium: 142 meq/L (ref 135–145)
Total Bilirubin: 0.6 mg/dL (ref 0.3–1.2)
Total Protein: 7.5 g/dL (ref 6.0–8.3)

## 2012-03-02 LAB — CBC
HCT: 37.4 % (ref 36.0–46.0)
Hemoglobin: 12.1 g/dL (ref 12.0–15.0)
MCH: 28.9 pg (ref 26.0–34.0)
MCHC: 32.4 g/dL (ref 30.0–36.0)
MCV: 89.5 fL (ref 78.0–100.0)
Platelets: 217 K/uL (ref 150–400)
RBC: 4.18 MIL/uL (ref 3.87–5.11)
RDW: 14.1 % (ref 11.5–15.5)
WBC: 6.5 K/uL (ref 4.0–10.5)

## 2012-03-02 NOTE — Progress Notes (Signed)
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 Problem #2 stage I (T1 A.) melanoma left thigh superficial spreading type with resection in December 2007 followed by reexcision in 08/06/2006 with negative margins also thus far without recurrence. Problem #3 degenerative joint disease of the spine with left hip replacement x2 in the past Problem #4 right knee replacement 2007 Problem #5 cholecystectomy 1995 Problem #6 penicillin allergy which gives her a rash Problem #7 severe varicose veins bilaterally  The him is here today for followup with her daughter and she is having some discomfort in her back which is chronic but she thinks is slightly worse OS to 3 weeks. She is also little more fatigue and the pain in the back is also near her scapula on the right. She is little hard of hearing and is now 78 years old. She is not aware of lumps or bumps anywhere no fevers chills night sweats and no adenopathy she aware of. Vital signs are recorded in the chart and are essentially stable. I did not detect paravertebral spasticity of her muscles. She has no adenopathy. Her lungs were clear. Her heart shows a regular rhythm and rate. Chest wall is clear and both breasts are negative for masses. Abdomen was soft and nontender without hepatosplenomegaly.. She has no peripheral edema speak of.  Because of her history of 2 cancers I will get some x-rays of her spine and the chest x-ray and some blood work. If all is well I will see her back in a year.

## 2012-03-02 NOTE — Patient Instructions (Addendum)
[name] [name]  [id number] 05-10-1923 Dr. Everardo All   [facility]  Discharge Instructions  RECOMMENDATIONS MADE BY THE CONSULTANT AND ANY TEST RESULTS WILL BE SENT TO YOUR REFERRING DOCTOR.   EXAM FINDINGS BY MD TODAY AND SIGNS AND SYMPTOMS TO REPORT TO CLINIC OR PRIMARY MD: no evidence of recurrence of the melanoma by exam.  We will check some blood work and Production manager today.  If there are any problems with your results we will let you know.  MEDICATIONS PRESCRIBED: none   INSTRUCTIONS GIVEN AND DISCUSSED: Other :  Report any new lumps, moles that look funny, increased shortness of breath, etc.  SPECIAL INSTRUCTIONS/FOLLOW-UP: Return to Clinic on in 6 months.   I acknowledge that I have been informed and understand all the instructions given to me and received a copy. I do not have any more questions at this time, but understand that I may call the Specialty Clinic at [facility] at [phone] during business hours should I have any further questions or need assistance in obtaining follow-up care.    __________________________________________  _____________  __________ Signature of Patient or Authorized Representative            Date                   Time    __________________________________________ Nurse's Signature

## 2012-03-03 LAB — FERRITIN: Ferritin: 13 ng/mL (ref 10–291)

## 2012-03-05 ENCOUNTER — Encounter (HOSPITAL_COMMUNITY): Payer: Medicare Other | Attending: Oncology

## 2012-03-05 VITALS — BP 139/61 | HR 68 | Temp 97.8°F | Resp 20

## 2012-03-05 DIAGNOSIS — D649 Anemia, unspecified: Secondary | ICD-10-CM | POA: Diagnosis not present

## 2012-03-05 MED ORDER — SODIUM CHLORIDE 0.9 % IJ SOLN
INTRAMUSCULAR | Status: AC
  Filled 2012-03-05: qty 10

## 2012-03-05 MED ORDER — SODIUM CHLORIDE 0.9 % IV SOLN
1020.0000 mg | Freq: Once | INTRAVENOUS | Status: AC
  Administered 2012-03-05: 1020 mg via INTRAVENOUS
  Filled 2012-03-05: qty 34

## 2012-03-22 DIAGNOSIS — Z1239 Encounter for other screening for malignant neoplasm of breast: Secondary | ICD-10-CM | POA: Diagnosis not present

## 2012-03-22 DIAGNOSIS — Z853 Personal history of malignant neoplasm of breast: Secondary | ICD-10-CM | POA: Diagnosis not present

## 2012-05-04 DIAGNOSIS — Z23 Encounter for immunization: Secondary | ICD-10-CM | POA: Diagnosis not present

## 2012-06-01 DIAGNOSIS — Z947 Corneal transplant status: Secondary | ICD-10-CM | POA: Diagnosis not present

## 2012-07-08 DIAGNOSIS — H35359 Cystoid macular degeneration, unspecified eye: Secondary | ICD-10-CM | POA: Diagnosis not present

## 2012-07-20 DIAGNOSIS — Z961 Presence of intraocular lens: Secondary | ICD-10-CM | POA: Diagnosis not present

## 2012-07-20 DIAGNOSIS — Z947 Corneal transplant status: Secondary | ICD-10-CM | POA: Diagnosis not present

## 2012-07-20 DIAGNOSIS — H353190 Nonexudative age-related macular degeneration, unspecified eye, stage unspecified: Secondary | ICD-10-CM | POA: Diagnosis not present

## 2012-07-29 DIAGNOSIS — H52229 Regular astigmatism, unspecified eye: Secondary | ICD-10-CM | POA: Diagnosis not present

## 2012-07-29 DIAGNOSIS — H353190 Nonexudative age-related macular degeneration, unspecified eye, stage unspecified: Secondary | ICD-10-CM | POA: Diagnosis not present

## 2012-07-29 DIAGNOSIS — H5200 Hypermetropia, unspecified eye: Secondary | ICD-10-CM | POA: Diagnosis not present

## 2012-07-29 DIAGNOSIS — Z947 Corneal transplant status: Secondary | ICD-10-CM | POA: Diagnosis not present

## 2012-08-10 ENCOUNTER — Other Ambulatory Visit (HOSPITAL_COMMUNITY): Payer: Self-pay | Admitting: Family Medicine

## 2012-08-10 DIAGNOSIS — Z139 Encounter for screening, unspecified: Secondary | ICD-10-CM

## 2012-09-01 ENCOUNTER — Ambulatory Visit (HOSPITAL_COMMUNITY): Payer: Medicare Other | Admitting: Oncology

## 2012-09-02 ENCOUNTER — Ambulatory Visit (HOSPITAL_COMMUNITY): Payer: Medicare Other

## 2012-09-03 ENCOUNTER — Ambulatory Visit (HOSPITAL_COMMUNITY): Payer: Medicare Other | Admitting: Oncology

## 2012-09-06 ENCOUNTER — Ambulatory Visit (HOSPITAL_COMMUNITY)
Admission: RE | Admit: 2012-09-06 | Discharge: 2012-09-06 | Disposition: A | Discharge status: Home / Self Care | Payer: Medicare Other | Source: Ambulatory Visit | Attending: Family Medicine | Admitting: Family Medicine

## 2012-09-06 DIAGNOSIS — Z139 Encounter for screening, unspecified: Secondary | ICD-10-CM

## 2012-09-06 DIAGNOSIS — Z1231 Encounter for screening mammogram for malignant neoplasm of breast: Secondary | ICD-10-CM | POA: Insufficient documentation

## 2012-09-10 DIAGNOSIS — W19XXXA Unspecified fall, initial encounter: Secondary | ICD-10-CM

## 2012-09-10 HISTORY — DX: Unspecified place in unspecified non-institutional (private) residence as the place of occurrence of the external cause: W19.XXXA

## 2012-09-13 ENCOUNTER — Encounter (HOSPITAL_COMMUNITY): Payer: Self-pay | Admitting: Oncology

## 2012-09-13 ENCOUNTER — Encounter (HOSPITAL_COMMUNITY): Payer: Medicare Other | Attending: Oncology | Admitting: Oncology

## 2012-09-13 VITALS — BP 148/74 | HR 80 | Temp 97.7°F | Resp 18 | Wt 198.9 lb

## 2012-09-13 DIAGNOSIS — Z87898 Personal history of other specified conditions: Secondary | ICD-10-CM

## 2012-09-13 DIAGNOSIS — D500 Iron deficiency anemia secondary to blood loss (chronic): Secondary | ICD-10-CM

## 2012-09-13 DIAGNOSIS — C4370 Malignant melanoma of unspecified lower limb, including hip: Secondary | ICD-10-CM | POA: Diagnosis not present

## 2012-09-13 DIAGNOSIS — D649 Anemia, unspecified: Secondary | ICD-10-CM

## 2012-09-13 DIAGNOSIS — D0510 Intraductal carcinoma in situ of unspecified breast: Secondary | ICD-10-CM

## 2012-09-13 NOTE — Patient Instructions (Addendum)
[facility] Discharge Instructions  RECOMMENDATIONS MADE BY THE CONSULTANT AND ANY TEST RESULTS WILL BE SENT TO YOUR REFERRING PHYSICIAN.  EXAM FINDINGS BY THE PHYSICIAN TODAY AND SIGNS OR SYMPTOMS TO REPORT TO CLINIC OR PRIMARY PHYSICIAN: Exam and discussion by MD.  Bruising is from the Plavix.  No evidence of recurrence by exam.  MEDICATIONS PRESCRIBED:  none  INSTRUCTIONS GIVEN AND DISCUSSED: Report any new lumps, bone pain or shortness of breath, unusual moles,etc.  SPECIAL INSTRUCTIONS/FOLLOW-UP: Follow-up in 6 months.  Thank you for choosing [facility] to provide your oncology and hematology care.  To afford each patient quality time with our providers, please arrive at least 15 minutes before your scheduled appointment time.  With your help, our goal is to use those 15 minutes to complete the necessary work-up to ensure our physicians have the information they need to help with your evaluation and healthcare recommendations.    Effective January 1st, 2014, we ask that you re-schedule your appointment with our physicians should you arrive 10 or more minutes late for your appointment.  We strive to give you quality time with our providers, and arriving late affects you and other patients whose appointments are after yours.    Again, thank you for choosing [facility].  Our hope is that these requests will decrease the amount of time that you wait before being seen by our physicians.       _____________________________________________________________  Should you have questions after your visit to [facility] ([place]), please contact our office at [phone] between the hours of 8:30 a.m. and 5:00 p.m.  Voicemails left after 4:30 p.m. will not be returned until the following business day.  For prescription refill requests, have your pharmacy contact our office with your prescription refill request.

## 2012-09-13 NOTE — Progress Notes (Signed)
Problem number 1 stage I (T1 A.) melanoma the left by superficial spreading type thus far without recurrence with resection in December 2007 and re-resection 08/06/2006. She had negative margins at that time. Problem#2 DCIS of the right breast in June 1998 status post surgical resection and she thinks she had radiation therapy as well. Problem #3 iron deficiency anemia GI blood losses with colonoscopy revealing friable anal canal hemorrhoids and diverticulosis in March 2010 by Dr. Sydell Axon Problem #4 right knee replacement 2007 by Dr. Aline Brochure Problem #5 left hip replacement x2 by Dr. Eulas Post years ago Problem #6 recent fall with a large ecchymosis on her right lower flank and hip area. She is on Plavix  Oncology review of systems is negative. Her lab work is due to be done soon with her physical exam by Dr. Orson Ape. She is not aware of new lesions on her skin. Vital signs are stable. I think she is mildly forgetful at times. Lymph nodes are negative throughout. Both breasts are negative for masses. Both breasts have scars which are well-healed. Lungs are clear. Heart reveals a very soft grade 1/6 systolic ejection murmur without S3 gallop or murmur otherwise. Abdomen is obese without obvious hepatosplenomegaly. Bowel sounds are normal. She states that Metamucil is really helping her bowels work better and less irritation of her hemorrhoids. Legs are without edema. Skin is without suspicious new lesions.  We will see her back in 6 months and if she has not had blood work then we will do it at that time.

## 2012-09-23 DIAGNOSIS — I719 Aortic aneurysm of unspecified site, without rupture: Secondary | ICD-10-CM | POA: Diagnosis not present

## 2012-09-23 DIAGNOSIS — R55 Syncope and collapse: Secondary | ICD-10-CM | POA: Diagnosis not present

## 2012-09-23 DIAGNOSIS — I359 Nonrheumatic aortic valve disorder, unspecified: Secondary | ICD-10-CM | POA: Diagnosis not present

## 2012-09-24 ENCOUNTER — Other Ambulatory Visit (HOSPITAL_COMMUNITY): Payer: Self-pay | Admitting: Internal Medicine

## 2012-09-24 DIAGNOSIS — I729 Aneurysm of unspecified site: Secondary | ICD-10-CM

## 2012-09-27 DIAGNOSIS — Z79899 Other long term (current) drug therapy: Secondary | ICD-10-CM | POA: Diagnosis not present

## 2012-09-29 ENCOUNTER — Ambulatory Visit (HOSPITAL_COMMUNITY): Admission: RE | Admit: 2012-09-29 | Payer: Medicare Other | Source: Ambulatory Visit

## 2012-09-29 ENCOUNTER — Other Ambulatory Visit (HOSPITAL_COMMUNITY): Payer: Self-pay | Admitting: Internal Medicine

## 2012-09-29 DIAGNOSIS — I351 Nonrheumatic aortic (valve) insufficiency: Secondary | ICD-10-CM

## 2012-10-05 ENCOUNTER — Ambulatory Visit (HOSPITAL_COMMUNITY): Admission: RE | Admit: 2012-10-05 | Payer: Medicare Other | Source: Ambulatory Visit

## 2012-10-06 ENCOUNTER — Ambulatory Visit (HOSPITAL_COMMUNITY)
Admission: RE | Admit: 2012-10-06 | Discharge: 2012-10-06 | Disposition: A | Discharge status: Home / Self Care | Payer: Medicare Other | Source: Ambulatory Visit | Attending: Internal Medicine | Admitting: Internal Medicine

## 2012-10-06 DIAGNOSIS — I359 Nonrheumatic aortic valve disorder, unspecified: Secondary | ICD-10-CM | POA: Diagnosis not present

## 2012-10-06 DIAGNOSIS — I351 Nonrheumatic aortic (valve) insufficiency: Secondary | ICD-10-CM

## 2012-10-06 HISTORY — PX: TRANSTHORACIC ECHOCARDIOGRAM: SHX275

## 2012-10-06 NOTE — Progress Notes (Signed)
[facility]   2D echo completed 10/06/2012.   Jamison Neighbor, RDCS

## 2012-10-07 ENCOUNTER — Other Ambulatory Visit (HOSPITAL_COMMUNITY): Payer: Self-pay | Admitting: Family Medicine

## 2012-10-07 ENCOUNTER — Other Ambulatory Visit (HOSPITAL_COMMUNITY): Payer: Medicare Other

## 2012-10-07 DIAGNOSIS — Z139 Encounter for screening, unspecified: Secondary | ICD-10-CM

## 2012-10-07 DIAGNOSIS — L259 Unspecified contact dermatitis, unspecified cause: Secondary | ICD-10-CM | POA: Diagnosis not present

## 2012-10-07 DIAGNOSIS — Z6838 Body mass index (BMI) 38.0-38.9, adult: Secondary | ICD-10-CM | POA: Diagnosis not present

## 2012-10-07 DIAGNOSIS — I10 Essential (primary) hypertension: Secondary | ICD-10-CM | POA: Diagnosis not present

## 2012-10-07 DIAGNOSIS — M1990 Unspecified osteoarthritis, unspecified site: Secondary | ICD-10-CM | POA: Diagnosis not present

## 2012-10-08 ENCOUNTER — Other Ambulatory Visit (HOSPITAL_COMMUNITY): Payer: Medicare Other

## 2012-10-11 ENCOUNTER — Ambulatory Visit (HOSPITAL_COMMUNITY)
Admission: RE | Admit: 2012-10-11 | Discharge: 2012-10-11 | Disposition: A | Discharge status: Home / Self Care | Payer: Medicare Other | Source: Ambulatory Visit | Attending: Internal Medicine | Admitting: Internal Medicine

## 2012-10-11 DIAGNOSIS — J9819 Other pulmonary collapse: Secondary | ICD-10-CM | POA: Diagnosis not present

## 2012-10-11 DIAGNOSIS — I2510 Atherosclerotic heart disease of native coronary artery without angina pectoris: Secondary | ICD-10-CM | POA: Diagnosis not present

## 2012-10-11 DIAGNOSIS — I729 Aneurysm of unspecified site: Secondary | ICD-10-CM

## 2012-10-11 DIAGNOSIS — I712 Thoracic aortic aneurysm, without rupture, unspecified: Secondary | ICD-10-CM | POA: Insufficient documentation

## 2012-10-11 MED ORDER — IOHEXOL 350 MG/ML SOLN
100.0000 mL | Freq: Once | INTRAVENOUS | Status: AC | PRN
  Administered 2012-10-11: 100 mL via INTRAVENOUS

## 2012-10-14 ENCOUNTER — Ambulatory Visit (HOSPITAL_COMMUNITY)
Admission: RE | Admit: 2012-10-14 | Discharge: 2012-10-14 | Disposition: A | Discharge status: Home / Self Care | Payer: Medicare Other | Source: Ambulatory Visit | Attending: Family Medicine | Admitting: Family Medicine

## 2012-10-14 DIAGNOSIS — Z13820 Encounter for screening for osteoporosis: Secondary | ICD-10-CM | POA: Insufficient documentation

## 2012-10-14 DIAGNOSIS — Z139 Encounter for screening, unspecified: Secondary | ICD-10-CM

## 2012-10-14 DIAGNOSIS — Z780 Asymptomatic menopausal state: Secondary | ICD-10-CM | POA: Diagnosis not present

## 2012-10-14 DIAGNOSIS — M810 Age-related osteoporosis without current pathological fracture: Secondary | ICD-10-CM | POA: Diagnosis not present

## 2012-11-02 ENCOUNTER — Ambulatory Visit (INDEPENDENT_AMBULATORY_CARE_PROVIDER_SITE_OTHER): Payer: Medicare Other

## 2012-11-02 ENCOUNTER — Encounter: Payer: Self-pay | Admitting: Orthopedic Surgery

## 2012-11-02 ENCOUNTER — Ambulatory Visit (INDEPENDENT_AMBULATORY_CARE_PROVIDER_SITE_OTHER): Payer: Medicare Other | Admitting: Orthopedic Surgery

## 2012-11-02 VITALS — BP 140/64 | Ht 66.0 in | Wt 192.0 lb

## 2012-11-02 DIAGNOSIS — M25569 Pain in unspecified knee: Secondary | ICD-10-CM

## 2012-11-02 DIAGNOSIS — M25562 Pain in left knee: Secondary | ICD-10-CM

## 2012-11-02 DIAGNOSIS — M25552 Pain in left hip: Secondary | ICD-10-CM

## 2012-11-02 DIAGNOSIS — M25559 Pain in unspecified hip: Secondary | ICD-10-CM

## 2012-11-02 DIAGNOSIS — IMO0002 Reserved for concepts with insufficient information to code with codable children: Secondary | ICD-10-CM

## 2012-11-02 DIAGNOSIS — M48061 Spinal stenosis, lumbar region without neurogenic claudication: Secondary | ICD-10-CM

## 2012-11-02 MED ORDER — GABAPENTIN 100 MG PO CAPS: 100.0000 mg | ORAL_CAPSULE | Freq: Three times a day (TID) | ORAL | Status: DC

## 2012-11-02 NOTE — Patient Instructions (Addendum)
I recommend pain medication at this time to relieve your symptoms do not see any complications with your prostheses   Recommend podiatry evaluation for foot pain and swelling

## 2012-11-02 NOTE — Progress Notes (Signed)
Patient ID: [name] [name], female   DOB: Jul 18, 1923, 79 y.o.   MRN: [id number] Chief Complaint  Patient presents with  . Hip Pain    Left hip pain and right foot pain, no injury Referred by DR. McGough    History this patient is referred to [place] by the [facility] for left hip pain for 2 months apparently she fell 5 weeks ago occurring to the notes. Continues with left hip pain which she describes a sharp pain in the left hip reports intensity of 8/10. She's got a hip injection 3 weeks ago she's taking Tylenol 3 times a day pain occurs morning and night better with Tylenol but the pain remains constant. 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  Allergic to penicillin and codeine  Physical Exam(12)  Vital signs: BP 140/64  Ht 5\' 6"  (1.676 m)  Wt 192 lb (87.091 kg)  BMI 31 kg/m2   GENERAL: normal development , mesomorphic to endomorphic body habitus  CDV: pulses are normal , multiple varicose veins  Skin: Normal skin in the lower extremities lumbar and thoracic spine except the  lateral part of the right knee just above the kneecap there is a skin rash Lymph: nodes were not palpable/normal  Psychiatric: awake, alert and oriented  Neuro: normal sensation  MSK  Gait: Altered gait supported by a cane 1 Inspection no tenderness or pain in the right hip 2 Range of Motion normal joint range of motion status post hip replacement and revision 3 Motor normal motor exam in the lower extremity 4 Stability hip joint stable  Other side:  5 normal range of motion right hip 6 status post right knee replacement normal range of motion, normal strength in the right lower extremity  Lumbar spine tenderness negative straight leg raise  Imaging lumbar spine views show degenerative arthrosis spondylosis, left hip revision normal appearance  Assessment: Spinal stenosis of lumbar region with radiculopathy - Plan: gabapentin (NEURONTIN) 100 MG capsule  Hip pain, left - Plan: DG Pelvis 1-2 Views, gabapentin (NEURONTIN) 100 MG capsule  Pain in joint, lower leg, left - Plan: DG Lumbar Spine 2-3 Views, gabapentin (NEURONTIN) 100 MG capsule      Plan: Followup in one month

## 2012-11-03 ENCOUNTER — Telehealth: Payer: Self-pay | Admitting: *Deleted

## 2012-11-03 ENCOUNTER — Other Ambulatory Visit: Payer: Self-pay | Admitting: *Deleted

## 2012-11-03 DIAGNOSIS — M25571 Pain in right ankle and joints of right foot: Secondary | ICD-10-CM

## 2012-11-03 DIAGNOSIS — M25579 Pain in unspecified ankle and joints of unspecified foot: Secondary | ICD-10-CM

## 2012-11-03 NOTE — Telephone Encounter (Signed)
Faxed referral and office notes to Dr. Caprice Beaver. Awaiting appointment.

## 2012-11-08 NOTE — Telephone Encounter (Signed)
Butch Penny, at [facility], called and has tried to reach patient regarding appointment. Patient does not have an answering machine, but states she will try to contact patient's daughters.

## 2012-11-09 DIAGNOSIS — Z85828 Personal history of other malignant neoplasm of skin: Secondary | ICD-10-CM | POA: Diagnosis not present

## 2012-11-09 DIAGNOSIS — L989 Disorder of the skin and subcutaneous tissue, unspecified: Secondary | ICD-10-CM | POA: Diagnosis not present

## 2012-11-11 ENCOUNTER — Telehealth: Payer: Self-pay | Admitting: Orthopedic Surgery

## 2012-11-11 NOTE — Telephone Encounter (Signed)
[name] [name], daughter, [name] [name] called this morning and said [name] [name] is taking Gabapentin 100 mg 3 x a day and it is not helping her back and leg pain much at all.  She said [name] [name] is in pain most of the time, has had to start using her walker to walk.  Asking if you can increase the Gabapentin or add another medicine to help her pain.  She uses [facility] in [place].  Joaquim Lai' # [phone]

## 2012-11-11 NOTE — Telephone Encounter (Signed)
Increased to 200 mg 3 times a day

## 2012-11-11 NOTE — Telephone Encounter (Signed)
Tammy, since this is a medication change, will you call the patient's daughter at the number below Thanks

## 2012-11-11 NOTE — Telephone Encounter (Signed)
Called patient's daughter, Joaquim Lai, and advised her of medication change as stated in message.

## 2012-11-22 ENCOUNTER — Other Ambulatory Visit: Payer: Self-pay | Admitting: *Deleted

## 2012-11-22 ENCOUNTER — Telehealth: Payer: Self-pay | Admitting: Orthopedic Surgery

## 2012-11-22 DIAGNOSIS — M25562 Pain in left knee: Secondary | ICD-10-CM

## 2012-11-22 DIAGNOSIS — M25552 Pain in left hip: Secondary | ICD-10-CM

## 2012-11-22 DIAGNOSIS — M48061 Spinal stenosis, lumbar region without neurogenic claudication: Secondary | ICD-10-CM

## 2012-11-22 MED ORDER — GABAPENTIN 100 MG PO CAPS: 200.0000 mg | ORAL_CAPSULE | Freq: Three times a day (TID) | ORAL | Status: DC

## 2012-11-22 NOTE — Telephone Encounter (Signed)
ORDER MRI FOR HER  "TO IMAGE FOR ESI" DX SPINAL STENOSIS WITH RADICULOPATHY "

## 2012-11-22 NOTE — Telephone Encounter (Signed)
Clarene Reamer called about her mother, [name] [name].  After you increased the Gabapentin to 200mg  3 x a day, she is still in a lot of pain, said Danay says her pain is worse some of the time, starts in her back and goes down to her foot.  Asking if there is anything else you can suggest, or prescribe something else. She also takes Tylenol for her arthritis.  As of this morning she is out of the Gabapentin, do you want her to get it refilled? She uses [facility] in [place].  Joaquim Lai' # [phone]

## 2012-11-22 NOTE — Telephone Encounter (Signed)
[place] [employer] of doctor's reply

## 2012-11-29 ENCOUNTER — Other Ambulatory Visit: Payer: Self-pay | Admitting: Radiology

## 2012-11-29 DIAGNOSIS — M48061 Spinal stenosis, lumbar region without neurogenic claudication: Secondary | ICD-10-CM

## 2012-12-01 ENCOUNTER — Ambulatory Visit (HOSPITAL_COMMUNITY)
Admission: RE | Admit: 2012-12-01 | Discharge: 2012-12-01 | Disposition: A | Discharge status: Home / Self Care | Payer: Medicare Other | Source: Ambulatory Visit | Attending: Orthopedic Surgery | Admitting: Orthopedic Surgery

## 2012-12-01 ENCOUNTER — Encounter (HOSPITAL_COMMUNITY): Payer: Self-pay

## 2012-12-01 DIAGNOSIS — M47817 Spondylosis without myelopathy or radiculopathy, lumbosacral region: Secondary | ICD-10-CM | POA: Insufficient documentation

## 2012-12-01 DIAGNOSIS — M545 Low back pain, unspecified: Secondary | ICD-10-CM | POA: Insufficient documentation

## 2012-12-01 DIAGNOSIS — M5137 Other intervertebral disc degeneration, lumbosacral region: Secondary | ICD-10-CM | POA: Insufficient documentation

## 2012-12-01 DIAGNOSIS — R209 Unspecified disturbances of skin sensation: Secondary | ICD-10-CM | POA: Diagnosis not present

## 2012-12-01 DIAGNOSIS — M51379 Other intervertebral disc degeneration, lumbosacral region without mention of lumbar back pain or lower extremity pain: Secondary | ICD-10-CM | POA: Insufficient documentation

## 2012-12-01 DIAGNOSIS — M48061 Spinal stenosis, lumbar region without neurogenic claudication: Secondary | ICD-10-CM

## 2012-12-07 ENCOUNTER — Encounter: Payer: Self-pay | Admitting: Orthopedic Surgery

## 2012-12-07 ENCOUNTER — Ambulatory Visit (INDEPENDENT_AMBULATORY_CARE_PROVIDER_SITE_OTHER): Payer: Medicare Other | Admitting: Orthopedic Surgery

## 2012-12-07 VITALS — BP 150/60 | Ht 66.0 in | Wt 192.0 lb

## 2012-12-07 DIAGNOSIS — M5137 Other intervertebral disc degeneration, lumbosacral region: Secondary | ICD-10-CM

## 2012-12-07 MED ORDER — TRAMADOL-ACETAMINOPHEN 37.5-325 MG PO TABS: 1.0000 | ORAL_TABLET | ORAL | Status: DC | PRN

## 2012-12-07 NOTE — Patient Instructions (Addendum)
Refer to Dr Ace Gins for [facility]

## 2012-12-07 NOTE — Progress Notes (Signed)
Patient ID: [name] [name], female   DOB: [birth year], 79 y.o.   MRN: [id number] Chief Complaint  Patient presents with  . Follow-up    one month recheck back and review MRI results    History the patient complains of back pain radiating down into her leg with numbness difficulty walking  Denies perianal anesthesia  Medical decision making and write her prescription for pain medicine which shows Ultracet secondary to codeine allergy. She will also need an injection for epidural steroids. Her problem has not improved  I reviewed her image report and her image and agree that she has degenerative disc disease and related nerve impingement  She is referred for epidural injections.

## 2012-12-08 ENCOUNTER — Telehealth: Payer: Self-pay | Admitting: *Deleted

## 2012-12-08 ENCOUNTER — Other Ambulatory Visit: Payer: Self-pay | Admitting: *Deleted

## 2012-12-08 DIAGNOSIS — H02409 Unspecified ptosis of unspecified eyelid: Secondary | ICD-10-CM | POA: Diagnosis not present

## 2012-12-08 DIAGNOSIS — M5137 Other intervertebral disc degeneration, lumbosacral region: Secondary | ICD-10-CM

## 2012-12-08 NOTE — Telephone Encounter (Signed)
Faxed referral and office notes to Dr. Niel Hummer for ESI injections. Awaiting appointment.

## 2012-12-14 DIAGNOSIS — Z85820 Personal history of malignant melanoma of skin: Secondary | ICD-10-CM | POA: Diagnosis not present

## 2012-12-14 DIAGNOSIS — L989 Disorder of the skin and subcutaneous tissue, unspecified: Secondary | ICD-10-CM | POA: Diagnosis not present

## 2012-12-14 DIAGNOSIS — Z85828 Personal history of other malignant neoplasm of skin: Secondary | ICD-10-CM | POA: Diagnosis not present

## 2012-12-28 DIAGNOSIS — M545 Low back pain: Secondary | ICD-10-CM | POA: Diagnosis not present

## 2012-12-28 DIAGNOSIS — M48062 Spinal stenosis, lumbar region with neurogenic claudication: Secondary | ICD-10-CM | POA: Diagnosis not present

## 2012-12-28 DIAGNOSIS — G894 Chronic pain syndrome: Secondary | ICD-10-CM | POA: Diagnosis not present

## 2012-12-28 DIAGNOSIS — IMO0002 Reserved for concepts with insufficient information to code with codable children: Secondary | ICD-10-CM | POA: Diagnosis not present

## 2012-12-31 DIAGNOSIS — K625 Hemorrhage of anus and rectum: Secondary | ICD-10-CM | POA: Diagnosis not present

## 2012-12-31 DIAGNOSIS — Z6837 Body mass index (BMI) 37.0-37.9, adult: Secondary | ICD-10-CM | POA: Diagnosis not present

## 2012-12-31 DIAGNOSIS — I10 Essential (primary) hypertension: Secondary | ICD-10-CM | POA: Diagnosis not present

## 2013-01-11 DIAGNOSIS — G894 Chronic pain syndrome: Secondary | ICD-10-CM | POA: Diagnosis not present

## 2013-01-11 DIAGNOSIS — M48062 Spinal stenosis, lumbar region with neurogenic claudication: Secondary | ICD-10-CM | POA: Diagnosis not present

## 2013-01-11 DIAGNOSIS — M545 Low back pain: Secondary | ICD-10-CM | POA: Diagnosis not present

## 2013-01-11 DIAGNOSIS — IMO0002 Reserved for concepts with insufficient information to code with codable children: Secondary | ICD-10-CM | POA: Diagnosis not present

## 2013-02-01 DIAGNOSIS — Z6836 Body mass index (BMI) 36.0-36.9, adult: Secondary | ICD-10-CM | POA: Diagnosis not present

## 2013-02-01 DIAGNOSIS — R42 Dizziness and giddiness: Secondary | ICD-10-CM | POA: Diagnosis not present

## 2013-02-02 DIAGNOSIS — Z6836 Body mass index (BMI) 36.0-36.9, adult: Secondary | ICD-10-CM | POA: Diagnosis not present

## 2013-02-02 DIAGNOSIS — Z79899 Other long term (current) drug therapy: Secondary | ICD-10-CM | POA: Diagnosis not present

## 2013-02-02 DIAGNOSIS — R42 Dizziness and giddiness: Secondary | ICD-10-CM | POA: Diagnosis not present

## 2013-02-07 ENCOUNTER — Encounter (HOSPITAL_COMMUNITY): Payer: Self-pay

## 2013-02-07 ENCOUNTER — Emergency Department (HOSPITAL_COMMUNITY): Payer: Medicare Other

## 2013-02-07 ENCOUNTER — Emergency Department (HOSPITAL_COMMUNITY)
Admission: EM | Admit: 2013-02-07 | Discharge: 2013-02-07 | Disposition: A | Discharge status: Home / Self Care | Payer: Medicare Other | Attending: Emergency Medicine | Admitting: Emergency Medicine

## 2013-02-07 ENCOUNTER — Other Ambulatory Visit: Payer: Self-pay

## 2013-02-07 DIAGNOSIS — Z86718 Personal history of other venous thrombosis and embolism: Secondary | ICD-10-CM | POA: Insufficient documentation

## 2013-02-07 DIAGNOSIS — Z87891 Personal history of nicotine dependence: Secondary | ICD-10-CM | POA: Diagnosis not present

## 2013-02-07 DIAGNOSIS — Z85820 Personal history of malignant melanoma of skin: Secondary | ICD-10-CM | POA: Diagnosis not present

## 2013-02-07 DIAGNOSIS — Z8673 Personal history of transient ischemic attack (TIA), and cerebral infarction without residual deficits: Secondary | ICD-10-CM | POA: Insufficient documentation

## 2013-02-07 DIAGNOSIS — R42 Dizziness and giddiness: Secondary | ICD-10-CM

## 2013-02-07 DIAGNOSIS — Z8639 Personal history of other endocrine, nutritional and metabolic disease: Secondary | ICD-10-CM | POA: Insufficient documentation

## 2013-02-07 DIAGNOSIS — Z862 Personal history of diseases of the blood and blood-forming organs and certain disorders involving the immune mechanism: Secondary | ICD-10-CM | POA: Insufficient documentation

## 2013-02-07 DIAGNOSIS — Z79899 Other long term (current) drug therapy: Secondary | ICD-10-CM | POA: Diagnosis not present

## 2013-02-07 DIAGNOSIS — Z8719 Personal history of other diseases of the digestive system: Secondary | ICD-10-CM | POA: Insufficient documentation

## 2013-02-07 DIAGNOSIS — Z8781 Personal history of (healed) traumatic fracture: Secondary | ICD-10-CM | POA: Insufficient documentation

## 2013-02-07 DIAGNOSIS — Z8742 Personal history of other diseases of the female genital tract: Secondary | ICD-10-CM | POA: Insufficient documentation

## 2013-02-07 DIAGNOSIS — J449 Chronic obstructive pulmonary disease, unspecified: Secondary | ICD-10-CM | POA: Diagnosis not present

## 2013-02-07 DIAGNOSIS — Z9889 Other specified postprocedural states: Secondary | ICD-10-CM | POA: Insufficient documentation

## 2013-02-07 DIAGNOSIS — R0602 Shortness of breath: Secondary | ICD-10-CM | POA: Insufficient documentation

## 2013-02-07 DIAGNOSIS — R112 Nausea with vomiting, unspecified: Secondary | ICD-10-CM | POA: Diagnosis not present

## 2013-02-07 DIAGNOSIS — Z853 Personal history of malignant neoplasm of breast: Secondary | ICD-10-CM | POA: Diagnosis not present

## 2013-02-07 DIAGNOSIS — Z880 Allergy status to penicillin: Secondary | ICD-10-CM | POA: Diagnosis not present

## 2013-02-07 DIAGNOSIS — Z8679 Personal history of other diseases of the circulatory system: Secondary | ICD-10-CM | POA: Diagnosis not present

## 2013-02-07 LAB — URINALYSIS, ROUTINE W REFLEX MICROSCOPIC
Ketones, ur: NEGATIVE mg/dL
Nitrite: NEGATIVE
Protein, ur: NEGATIVE mg/dL
Urobilinogen, UA: 0.2 mg/dL (ref 0.0–1.0)

## 2013-02-07 LAB — CBC WITH DIFFERENTIAL/PLATELET
Basophils Relative: 1 % (ref 0–1)
HCT: 35.7 % — ABNORMAL LOW (ref 36.0–46.0)
Hemoglobin: 11.2 g/dL — ABNORMAL LOW (ref 12.0–15.0)
Lymphs Abs: 1.6 10*3/uL (ref 0.7–4.0)
MCH: 27.9 pg (ref 26.0–34.0)
MCHC: 31.4 g/dL (ref 30.0–36.0)
Monocytes Absolute: 0.5 10*3/uL (ref 0.1–1.0)
Monocytes Relative: 8 % (ref 3–12)
Neutro Abs: 4.1 10*3/uL (ref 1.7–7.7)
Neutrophils Relative %: 65 % (ref 43–77)
RBC: 4.01 MIL/uL (ref 3.87–5.11)

## 2013-02-07 LAB — COMPREHENSIVE METABOLIC PANEL
Alkaline Phosphatase: 62 U/L (ref 39–117)
BUN: 18 mg/dL (ref 6–23)
Chloride: 103 mEq/L (ref 96–112)
Creatinine, Ser: 0.92 mg/dL (ref 0.50–1.10)
GFR calc Af Amer: 63 mL/min — ABNORMAL LOW (ref >90)
GFR calc non Af Amer: 54 mL/min — ABNORMAL LOW (ref >90)
Glucose, Bld: 110 mg/dL — ABNORMAL HIGH (ref 70–99)
Potassium: 4.2 mEq/L (ref 3.5–5.1)
Total Bilirubin: 0.5 mg/dL (ref 0.3–1.2)

## 2013-02-07 LAB — TROPONIN I: Troponin I: <0.3 ng/mL (ref <0.30)

## 2013-02-07 MED ORDER — MECLIZINE HCL 25 MG PO TABS: ORAL_TABLET | ORAL | Status: DC

## 2013-02-07 NOTE — ED Provider Notes (Signed)
History    This chart was scribed for Maudry Diego, MD, by Joline Maxcy, ED scribe. The patient was seen in room APAH2/APAH2 and the patient's care was started at 1705.   CSN: [id number] Arrival date & time 02/07/13  1619  First MD Initiated Contact with Patient 02/07/13 1705     Chief Complaint  Patient presents with  . Dizziness  . Nausea   (Consider location/radiation/quality/duration/timing/severity/associated sxs/prior Treatment) Patient is a 79 y.o. female presenting with general illness. The history is provided by the patient and medical records. No language interpreter was used.  Illness Severity:  Unable to specify Onset quality:  Unable to specify Duration:  1 month Timing:  Intermittent Relieved by:  Walking any distances Associated symptoms: nausea, shortness of breath and vomiting   Associated symptoms: no abdominal pain, no chest pain, no congestion, no cough, no diarrhea, no fatigue, no headaches and no rash     HPI Comments: [name] [name] is a 79 y.o. female who presents to the Emergency Department complaining of gradual onset, gradually worsening lightheadedness that is aggravated by walking any distances with associated near-syncopal episodes that began around a month ago. She reports after standing she gets nauseous and SOB intermittently. She denies recent falls and dizziness. She reports she was seen by Dr. Glo Herring office 5-6 days ago and was told there was a change in her EKG so the office was going to schedule an appointment with a cardiologist. She denies ever receiving a call, and when her daughter followed up with the office today, she reports they had no information about the cardiologist appointment from her most recent visit. Her daughter also reports she has complained of feeling "full" all of the time over the last month. She reports she is able to consume liquids, but after eating she feels as if the food is getting "stuck". She reports emesis 1x  yesterday.   PCP is Dr. Orson Ape.  Past Medical History  Diagnosis Date  . History of blood clots     in leg  . History of knee surgery   . Mini stroke   . Hip fracture     hip surgery 2001  . Stomach ulcer     secondary to h.pylori, s/p treatment  . Thyroid condition   . Bladder infection     History  . Kidney infection     History  . Melanoma of thigh     left  . DCIS (ductal carcinoma in situ) of breast     RIGHT BREAST  . Aneurysm, thoracic aortic   . Dehydration     HISTORY   . S/P colonoscopy March 2010    RMR: friable anal canal hemorrhoids, hyperplastic ascending polyp, adenomatous descending polyp   . Fall at home 09/10/12   Past Surgical History  Procedure Laterality Date  . Partial hysterectomy    . Leg surgery    . Breast lumpectomy    . Appendectomy    . Cataract extraction, bilateral    . Melanoma excision      left leg excision  . Tonsillectomy    . Cholecystectomy    . Varicose vein surgery    . Esophagogastroduodenoscopy  06/05/11    small hiatal hernia; + H.PYLORI GASTRITIS, s/p 5 days of Pylera, unable to finish due to N/V  . Colonoscopy  06/05/11    pancolonic diverticulosis/ileal reosion/abnormal anorectal junction s/p biopsy: path for small intestine and Ti was benign with non-villous atrophy, rectal biopsy with  prominent prolapse changes, no acute inflammation  . Corneal transplant  2013  . Knee surgery  05/2006    total right  . Total hip arthroplasty  2001&2004     X 2 FOR LEFT HIP   Family History  Problem Relation Age of Onset  . Colon cancer Neg Hx   . Cancer Daughter     breast cancer  . Cancer Daughter     breast cancer   History  Substance Use Topics  . Smoking status: Former Smoker -- 2.00 packs/day    Types: Cigarettes  . Smokeless tobacco: Former Systems developer    Quit date: 08/06/1983  . Alcohol Use: No   OB History   Grav Para Term Preterm Abortions TAB SAB Ect Mult Living                 Review of Systems  Constitutional:  Negative for appetite change and fatigue.  HENT: Negative for congestion, sinus pressure and ear discharge.   Eyes: Negative for discharge.  Respiratory: Positive for shortness of breath. Negative for cough.   Cardiovascular: Negative for chest pain.  Gastrointestinal: Positive for nausea and vomiting. Negative for abdominal pain and diarrhea.  Genitourinary: Negative for frequency and hematuria.  Musculoskeletal: Negative for back pain.  Skin: Negative for rash.  Neurological: Positive for light-headedness. Negative for seizures and headaches.  Psychiatric/Behavioral: Negative for hallucinations.    Allergies  Codeine; Iron; and Penicillins  Home Medications   Current Outpatient Rx  Name  Route  Sig  Dispense  Refill  . acetaminophen (ARTHRITIS PAIN RELIEF) 650 MG CR tablet   Oral   Take 650 mg by mouth daily as needed. pain         . clopidogrel (PLAVIX) 75 MG tablet   Oral   Take 75 mg by mouth daily.           Marland Kitchen esomeprazole (NEXIUM) 20 MG capsule   Oral   Take 20 mg by mouth daily before breakfast.         . gabapentin (NEURONTIN) 100 MG capsule   Oral   Take 2 capsules (200 mg total) by mouth 3 (three) times daily.   90 capsule   2   . hydrocortisone (ANUSOL-HC) 25 MG suppository   Rectal   Place 1 suppository (25 mg total) rectally 2 (two) times daily. For 7 days.   14 suppository   0   . Multiple Vitamin (MULTIVITAMIN) capsule   Oral   Take 1 capsule by mouth daily.           . Multiple Vitamins-Minerals (EYE VITAMINS PO)   Oral   Take 1 tablet by mouth daily.         . Potassium Chloride Crys CR (KLOR-CON M20 PO)   Oral   Take 20 mEq by mouth 2 (two) times daily.         . promethazine (PHENERGAN) 25 MG tablet   Oral   Take 25 mg by mouth Every 6 hours as needed.         . sodium chloride (MURO 128) 2 % ophthalmic solution   Both Eyes   Place 1 drop into both eyes as needed.          . traMADol-acetaminophen (ULTRACET) 37.5-325  MG per tablet   Oral   Take 1-2 tablets by mouth every 4 (four) hours as needed for pain.   90 tablet   5    BP 126/71  Pulse 76  Temp(Src) 98 F (36.7 C) (Oral)  Resp 20  Ht 5\' 6"  (1.676 m)  Wt 186 lb (84.369 kg)  BMI 30.04 kg/m2  SpO2 97% Physical Exam  Vitals reviewed. Constitutional: She is oriented to person, place, and time. She appears well-developed.  HENT:  Head: Normocephalic.  Eyes: Conjunctivae and EOM are normal. No scleral icterus.  Neck: Neck supple. No thyromegaly present.  Cardiovascular: Normal rate and regular rhythm.  Exam reveals no gallop and no friction rub.   No murmur heard. Pulmonary/Chest: No stridor. She has no wheezes. She has no rales. She exhibits no tenderness.  Abdominal: She exhibits no distension. There is no tenderness. There is no rebound.  Musculoskeletal: Normal range of motion. She exhibits no edema.  Lymphadenopathy:    She has no cervical adenopathy.  Neurological: She is oriented to person, place, and time. Coordination normal.  Skin: No rash noted. No erythema.  Psychiatric: She has a normal mood and affect. Her behavior is normal.    ED Course  Procedures (including critical care time)  DIAGNOSTIC STUDIES: Oxygen Saturation is 97% on room air, normal by my interpretation.    COORDINATION OF CARE:  17:10- Discussed planned course of treatment with the patient, including a CBC with differential, comprehensive metabolic panel, UA with microscopic reflex, EKG, Troponin I, head CT without contrast, and chest X-ray, who is agreeable at this time.  20:10- Discussed blood work and imaging results with the pt, who is agreeable at this time. Will discharge with Antivert and follow up with Dr. Orson Ape.  Results for orders placed during the hospital encounter of 02/07/13  CBC WITH DIFFERENTIAL      Result Value Range   WBC 6.4  4.0 - 10.5 K/uL   RBC 4.01  3.87 - 5.11 MIL/uL   Hemoglobin 11.2 (*) 12.0 - 15.0 g/dL   HCT 35.7 (*) 36.0  - 46.0 %   MCV 89.0  78.0 - 100.0 fL   MCH 27.9  26.0 - 34.0 pg   MCHC 31.4  30.0 - 36.0 g/dL   RDW 14.1  11.5 - 15.5 %   Platelets 207  150 - 400 K/uL   Neutrophils Relative % 65  43 - 77 %   Neutro Abs 4.1  1.7 - 7.7 K/uL   Lymphocytes Relative 25  12 - 46 %   Lymphs Abs 1.6  0.7 - 4.0 K/uL   Monocytes Relative 8  3 - 12 %   Monocytes Absolute 0.5  0.1 - 1.0 K/uL   Eosinophils Relative 2  0 - 5 %   Eosinophils Absolute 0.1  0.0 - 0.7 K/uL   Basophils Relative 1  0 - 1 %   Basophils Absolute 0.0  0.0 - 0.1 K/uL  COMPREHENSIVE METABOLIC PANEL      Result Value Range   Sodium 141  135 - 145 mEq/L   Potassium 4.2  3.5 - 5.1 mEq/L   Chloride 103  96 - 112 mEq/L   CO2 29  19 - 32 mEq/L   Glucose, Bld 110 (*) 70 - 99 mg/dL   BUN 18  6 - 23 mg/dL   Creatinine, Ser 0.92  0.50 - 1.10 mg/dL   Calcium 9.1  8.4 - 10.5 mg/dL   Total Protein 7.0  6.0 - 8.3 g/dL   Albumin 3.7  3.5 - 5.2 g/dL   AST 18  0 - 37 U/L   ALT 9  0 - 35 U/L   Alkaline Phosphatase 62  39 - 117 U/L   Total Bilirubin 0.5  0.3 - 1.2 mg/dL   GFR calc non Af Amer 54 (*) >90 mL/min   GFR calc Af Amer 63 (*) >90 mL/min  URINALYSIS, ROUTINE W REFLEX MICROSCOPIC      Result Value Range   Color, Urine YELLOW  YELLOW   APPearance CLEAR  CLEAR   Specific Gravity, Urine 1.015  1.005 - 1.030   pH 5.0  5.0 - 8.0   Glucose, UA NEGATIVE  NEGATIVE mg/dL   Hgb urine dipstick NEGATIVE  NEGATIVE   Bilirubin Urine NEGATIVE  NEGATIVE   Ketones, ur NEGATIVE  NEGATIVE mg/dL   Protein, ur NEGATIVE  NEGATIVE mg/dL   Urobilinogen, UA 0.2  0.0 - 1.0 mg/dL   Nitrite NEGATIVE  NEGATIVE   Leukocytes, UA SMALL (*) NEGATIVE  TROPONIN I      Result Value Range   Troponin I <0.30  <0.30 ng/mL  URINE MICROSCOPIC-ADD ON      Result Value Range   WBC, UA 3-6  <3 WBC/hpf   Bacteria, UA RARE  RARE      Labs Reviewed  CBC WITH DIFFERENTIAL - Abnormal; Notable for the following:    Hemoglobin 11.2 (*)    HCT 35.7 (*)    All other  components within normal limits  COMPREHENSIVE METABOLIC PANEL - Abnormal; Notable for the following:    Glucose, Bld 110 (*)    GFR calc non Af Amer 54 (*)    GFR calc Af Amer 63 (*)    All other components within normal limits  URINALYSIS, ROUTINE W REFLEX MICROSCOPIC - Abnormal; Notable for the following:    Leukocytes, UA SMALL (*)    All other components within normal limits  TROPONIN I  URINE MICROSCOPIC-ADD ON   Dg Chest 2 View  02/07/2013   *RADIOLOGY REPORT*  Clinical Data: Dizziness, chest pain.  CHEST - 2 VIEW  Comparison: 03/02/2012  Findings: There is hyperinflation of the lungs compatible with COPD.  Mild cardiomegaly.  Mediastinal contours within normal limits.  No confluent airspace opacities.  No effusions.  No acute bony abnormality or pneumothorax.  IMPRESSION: COPD.  Cardiomegaly.  No acute findings.   Original Report Authenticated By: Rolm Baptise, M.D.   Ct Head Wo Contrast  02/07/2013   *RADIOLOGY REPORT*  Clinical Data: Dizziness, nausea.  CT HEAD WITHOUT CONTRAST  Technique:  Contiguous axial images were obtained from the base of the skull through the vertex without contrast.  Comparison: MRI 06/06/2008  Findings: Mild chronic microvascular changes throughout the deep white matter. No acute intracranial abnormality.  Specifically, no hemorrhage, hydrocephalus, mass lesion, acute infarction, or significant intracranial injury.  No acute calvarial abnormality. Visualized paranasal sinuses and mastoids clear.  Orbital soft tissues unremarkable.  IMPRESSION: Chronic microvascular changes. No acute intracranial abnormality.   Original Report Authenticated By: Rolm Baptise, M.D.   No diagnosis found.  Date: 02/07/2013  Rate 68  Rhythm: normal sinus rhythm  QRS Axis: left  Intervals: normal  ST/T Wave abnormalities: nonspecific ST changes  Conduction Disutrbances:right bundle branch block  Narrative Interpretation:   Old EKG Reviewed: unchanged   MDM  The chart was  scribed for me under my direct supervision.  I personally performed the history, physical, and medical decision making and all procedures in the evaluation of this patient.Maudry Diego, MD 02/07/13 2018

## 2013-02-07 NOTE — ED Notes (Signed)
Pt reports mild dizziness with position change from lying to sitting.  Moderate dizziness and weakness when standing.  nad noted.  Denies pain.

## 2013-02-07 NOTE — ED Notes (Signed)
Pt reports that she has been sick for "at least 3 weeks w/ dizziness", it occurs w/ standing and after standing she gets nauseous. +sob at times, all s/s come and go. Poor po intake in the last few weeks. Denies any falls. Has been to her pmd, did not change any meds, in process of getting appointment w/ cardiologist.

## 2013-02-07 NOTE — ED Notes (Signed)
The patient states when she changes positions "the whole world spins."  The patient states that she gets very dizzy and then she vomits.

## 2013-02-10 ENCOUNTER — Ambulatory Visit (INDEPENDENT_AMBULATORY_CARE_PROVIDER_SITE_OTHER): Payer: Medicare Other | Admitting: Internal Medicine

## 2013-02-10 ENCOUNTER — Encounter: Payer: Self-pay | Admitting: Internal Medicine

## 2013-02-10 VITALS — BP 162/70 | HR 62 | Ht 66.0 in | Wt 185.5 lb

## 2013-02-10 DIAGNOSIS — I4510 Unspecified right bundle-branch block: Secondary | ICD-10-CM | POA: Diagnosis not present

## 2013-02-10 DIAGNOSIS — I351 Nonrheumatic aortic (valve) insufficiency: Secondary | ICD-10-CM | POA: Insufficient documentation

## 2013-02-10 DIAGNOSIS — I359 Nonrheumatic aortic valve disorder, unspecified: Secondary | ICD-10-CM

## 2013-02-10 DIAGNOSIS — H8110 Benign paroxysmal vertigo, unspecified ear: Secondary | ICD-10-CM | POA: Insufficient documentation

## 2013-02-10 DIAGNOSIS — I712 Thoracic aortic aneurysm, without rupture: Secondary | ICD-10-CM | POA: Diagnosis not present

## 2013-02-10 NOTE — Patient Instructions (Addendum)
Your physician recommends that you schedule a follow-up appointment in:6 months.  [facility]

## 2013-02-10 NOTE — Progress Notes (Signed)
OFFICE NOTE  Chief Complaint:  Dizziness, nausea  Primary Care Physician: [name], MD  HPI:  [name] [name]  is an 79 year old female who is here for a followup visit. At last visit actually she had had a fall in the Spring and twisted her ankle. She had a what sounds like syncopal episode in the middle of the night after going to the bathroom after urinating, stood up, turned quickly in the bathroom, had a little bit of dizziness and then fell but reports she does not remember the episode. It sounds like she may have had micturition syncope; however, the fact that there was some prodrome, arrhythmia or other type of event cannot entirely be ruled out. We are also following her of course for a history of ascending aortic aneurysm which measured 4.2 x 4.2 by CT exam in 2012 with a normal EF and mild aortic insufficiency seen on echo. She denies any chest pain or worsening shortness of breath. Recently she's been having more dizziness and was seen by her primary care provider. EKG was noted to be a right bundle branch block, which was considered new and she was sent here for evaluation. In fact I saw her last February and she was in a right bundle branch block at that time. Her symptoms include vertigo and associated nausea especially with change in position. She does have significant hearing loss with a hearing aid in her right ear, however she cannot hear very well despite that. It has been over a year since she's been evaluated by an audiologist.  PMHx:  Past Medical History  Diagnosis Date  . History of blood clots     in leg  . History of knee surgery   . Mini stroke   . Hip fracture     hip surgery 2001  . Stomach ulcer     secondary to h.pylori, s/p treatment  . Thyroid condition   . Bladder infection     History  . Kidney infection     History  . Melanoma of thigh     left  . DCIS (ductal carcinoma in situ) of breast     RIGHT BREAST  . Aneurysm, thoracic aortic   .  Dehydration     HISTORY   . S/P colonoscopy March 2010    RMR: friable anal canal hemorrhoids, hyperplastic ascending polyp, adenomatous descending polyp   . Fall at home 09/10/12    Past Surgical History  Procedure Laterality Date  . Partial hysterectomy    . Leg surgery    . Breast lumpectomy    . Appendectomy    . Cataract extraction, bilateral    . Melanoma excision      left leg excision  . Tonsillectomy    . Cholecystectomy    . Varicose vein surgery    . Esophagogastroduodenoscopy  06/05/11    small hiatal hernia; + H.PYLORI GASTRITIS, s/p 5 days of Pylera, unable to finish due to N/V  . Colonoscopy  06/05/11    pancolonic diverticulosis/ileal reosion/abnormal anorectal junction s/p biopsy: path for small intestine and Ti was benign with non-villous atrophy, rectal biopsy with prominent prolapse changes, no acute inflammation  . Corneal transplant  2013  . Knee surgery  05/2006    total right  . Total hip arthroplasty  2001&2004     X 2 FOR LEFT HIP    FAMHx:  Family History  Problem Relation Age of Onset  . Colon cancer Neg Hx   .  Cancer Daughter     breast cancer  . Cancer Daughter     breast cancer    SOCHx:   reports that she quit smoking about 29 years ago. Her smoking use included Cigarettes. She smoked 2.00 packs per day. She has never used smokeless tobacco. She reports that she does not drink alcohol or use illicit drugs.  ALLERGIES:  Allergies  Allergen Reactions  . Codeine Diarrhea and Nausea Only  . Iron Nausea And Vomiting    Oral iron causes nausea and vomiting  . Penicillins Rash    ROS: A comprehensive review of systems was negative except for: Ears, nose, mouth, throat, and face: positive for hearing loss Neurological: positive for dizziness  HOME MEDS: Current Outpatient Prescriptions  Medication Sig Dispense Refill  . acetaminophen (ARTHRITIS PAIN RELIEF) 650 MG CR tablet Take 650 mg by mouth every 8 (eight) hours as needed for pain.  pain      . cholecalciferol (VITAMIN D) 1000 UNITS tablet Take 1,000 Units by mouth daily.      . clopidogrel (PLAVIX) 75 MG tablet Take 75 mg by mouth daily.        Marland Kitchen esomeprazole (NEXIUM) 20 MG capsule Take 20 mg by mouth daily before breakfast.      . gabapentin (NEURONTIN) 100 MG capsule Take 2 capsules (200 mg total) by mouth 3 (three) times daily.  90 capsule  2  . hydrocortisone (ANUSOL-HC) 2.5 % rectal cream Place 1 application rectally 2 (two) times daily as needed for hemorrhoids.      . meclizine (ANTIVERT) 25 MG tablet Take one every 6 hours for dizziness  15 tablet  0  . Multiple Vitamins-Minerals (OPTI-VITAMINS PO) Take 1 tablet by mouth daily.      . Potassium Chloride Crys CR (KLOR-CON M20 PO) Take 20 mEq by mouth 2 (two) times daily.      . promethazine (PHENERGAN) 25 MG tablet Take 25 mg by mouth Every 6 hours as needed for nausea.       . sodium chloride (MURO 128) 2 % ophthalmic solution Place 1 drop into both eyes 3 (three) times daily.       . traMADol-acetaminophen (ULTRACET) 37.5-325 MG per tablet Take 1-2 tablets by mouth every 4 (four) hours as needed for pain.  90 tablet  5   No current facility-administered medications for this visit.    LABS/IMAGING: No results found for this or any previous visit (from the past 48 hour(s)). No results found.  VITALS: BP 162/70  Pulse 62  Ht 5\' 6"  (1.676 m)  Wt 185 lb 8 oz (84.142 kg)  BMI 29.95 kg/m2  EXAM: General appearance: alert and no distress Neck: no adenopathy, no carotid bruit, no JVD, supple, symmetrical, trachea midline and thyroid not enlarged, symmetric, no tenderness/mass/nodules Lungs: clear to auscultation bilaterally Heart: regular rate and rhythm, S1, S2 normal, no murmur, click, rub or gallop Abdomen: soft, non-tender; bowel sounds normal; no masses,  no organomegaly and obese Extremities: extremities normal, atraumatic, no cyanosis or edema Pulses: 2+ and symmetric Skin: Skin color, texture, turgor  normal. No rashes or lesions Neurologic: Mental status: Alert, oriented, thought content appropriate HEENT: right hearing aid in place  EKG: Normal sinus rhythm at 68 with a right bundle branch block  ASSESSMENT: 1. Chronic right bundle-branch block 2. Benign paroxysmal positional vertigo 3. Hearing loss 4. Dizziness and associated nausea 5. Stable thoracic aortic aneurysm 6. Mild aortic insufficiency  PLAN: 1.   Mrs. Deutschman has chronic  right bundle branch block which is stable. There has been no interval change in her aortic aneurysm by CT this year she continues to only have mild aortic insufficiency. I do not believe any of these are continued into her episodes of dizziness, nausea and vertigo. The episodes do seem to be positional, and with her associated hearing loss, most likely represent benign paroxysmal positional vertigo. She denies any tinnitus, but she has very poor hearing despite wearing a hearing aid in the right ear. I think she needs a good ENT evaluation, including repeat audiological examination. She may benefit from Epley maneuvers. I'll refer her to Dr. Benjamine Mola for further evaluation.  Pixie Casino, MD, [facility] Attending Cardiologist [facility] 02/10/2013, 11:53 AM

## 2013-02-12 ENCOUNTER — Encounter: Payer: Self-pay | Admitting: Internal Medicine

## 2013-02-15 ENCOUNTER — Encounter: Payer: Self-pay | Admitting: Internal Medicine

## 2013-02-24 DIAGNOSIS — R42 Dizziness and giddiness: Secondary | ICD-10-CM | POA: Diagnosis not present

## 2013-02-24 DIAGNOSIS — R269 Unspecified abnormalities of gait and mobility: Secondary | ICD-10-CM | POA: Diagnosis not present

## 2013-02-24 DIAGNOSIS — H903 Sensorineural hearing loss, bilateral: Secondary | ICD-10-CM | POA: Diagnosis not present

## 2013-02-25 DIAGNOSIS — Z7901 Long term (current) use of anticoagulants: Secondary | ICD-10-CM | POA: Diagnosis not present

## 2013-02-25 DIAGNOSIS — Z7902 Long term (current) use of antithrombotics/antiplatelets: Secondary | ICD-10-CM | POA: Diagnosis not present

## 2013-02-28 DIAGNOSIS — R42 Dizziness and giddiness: Secondary | ICD-10-CM | POA: Diagnosis not present

## 2013-02-28 DIAGNOSIS — IMO0001 Reserved for inherently not codable concepts without codable children: Secondary | ICD-10-CM | POA: Diagnosis not present

## 2013-02-28 DIAGNOSIS — R262 Difficulty in walking, not elsewhere classified: Secondary | ICD-10-CM | POA: Diagnosis not present

## 2013-03-01 ENCOUNTER — Ambulatory Visit: Payer: Medicare Other | Admitting: Pharmacist Clinician (PhC)/ Clinical Pharmacy Specialist

## 2013-03-02 DIAGNOSIS — R42 Dizziness and giddiness: Secondary | ICD-10-CM | POA: Diagnosis not present

## 2013-03-02 DIAGNOSIS — R262 Difficulty in walking, not elsewhere classified: Secondary | ICD-10-CM | POA: Diagnosis not present

## 2013-03-02 DIAGNOSIS — IMO0001 Reserved for inherently not codable concepts without codable children: Secondary | ICD-10-CM | POA: Diagnosis not present

## 2013-03-07 DIAGNOSIS — IMO0001 Reserved for inherently not codable concepts without codable children: Secondary | ICD-10-CM | POA: Diagnosis not present

## 2013-03-07 DIAGNOSIS — R262 Difficulty in walking, not elsewhere classified: Secondary | ICD-10-CM | POA: Diagnosis not present

## 2013-03-07 DIAGNOSIS — R42 Dizziness and giddiness: Secondary | ICD-10-CM | POA: Diagnosis not present

## 2013-03-09 DIAGNOSIS — R262 Difficulty in walking, not elsewhere classified: Secondary | ICD-10-CM | POA: Diagnosis not present

## 2013-03-09 DIAGNOSIS — R42 Dizziness and giddiness: Secondary | ICD-10-CM | POA: Diagnosis not present

## 2013-03-09 DIAGNOSIS — IMO0001 Reserved for inherently not codable concepts without codable children: Secondary | ICD-10-CM | POA: Diagnosis not present

## 2013-03-11 DIAGNOSIS — R42 Dizziness and giddiness: Secondary | ICD-10-CM | POA: Diagnosis not present

## 2013-03-11 DIAGNOSIS — IMO0001 Reserved for inherently not codable concepts without codable children: Secondary | ICD-10-CM | POA: Diagnosis not present

## 2013-03-11 DIAGNOSIS — R262 Difficulty in walking, not elsewhere classified: Secondary | ICD-10-CM | POA: Diagnosis not present

## 2013-03-14 ENCOUNTER — Encounter (HOSPITAL_COMMUNITY): Payer: Medicare Other | Attending: Oncology | Admitting: Oncology

## 2013-03-14 ENCOUNTER — Encounter (HOSPITAL_COMMUNITY): Payer: Self-pay | Admitting: Oncology

## 2013-03-14 VITALS — BP 149/66 | HR 78 | Temp 98.2°F | Resp 16 | Wt 185.7 lb

## 2013-03-14 DIAGNOSIS — Z853 Personal history of malignant neoplasm of breast: Secondary | ICD-10-CM

## 2013-03-14 DIAGNOSIS — C439 Malignant melanoma of skin, unspecified: Secondary | ICD-10-CM | POA: Insufficient documentation

## 2013-03-14 DIAGNOSIS — D509 Iron deficiency anemia, unspecified: Secondary | ICD-10-CM | POA: Diagnosis not present

## 2013-03-14 DIAGNOSIS — D0511 Intraductal carcinoma in situ of right breast: Secondary | ICD-10-CM

## 2013-03-14 DIAGNOSIS — D0510 Intraductal carcinoma in situ of unspecified breast: Secondary | ICD-10-CM | POA: Insufficient documentation

## 2013-03-14 DIAGNOSIS — Z85820 Personal history of malignant melanoma of skin: Secondary | ICD-10-CM | POA: Diagnosis not present

## 2013-03-14 DIAGNOSIS — R42 Dizziness and giddiness: Secondary | ICD-10-CM | POA: Diagnosis not present

## 2013-03-14 DIAGNOSIS — IMO0001 Reserved for inherently not codable concepts without codable children: Secondary | ICD-10-CM | POA: Diagnosis not present

## 2013-03-14 DIAGNOSIS — R262 Difficulty in walking, not elsewhere classified: Secondary | ICD-10-CM | POA: Diagnosis not present

## 2013-03-14 HISTORY — DX: Intraductal carcinoma in situ of unspecified breast: D05.10

## 2013-03-14 HISTORY — DX: Malignant melanoma of skin, unspecified: C43.9

## 2013-03-14 HISTORY — DX: Iron deficiency anemia, unspecified: D50.9

## 2013-03-14 NOTE — Patient Instructions (Addendum)
[facility] Discharge Instructions  RECOMMENDATIONS MADE BY THE CONSULTANT AND ANY TEST RESULTS WILL BE SENT TO YOUR REFERRING PHYSICIAN.  EXAM FINDINGS BY THE PHYSICIAN TODAY AND SIGNS OR SYMPTOMS TO REPORT TO CLINIC OR PRIMARY PHYSICIAN: Exam findings as discussed by T. Kefalas, PA-C.  SPECIAL INSTRUCTIONS/FOLLOW-UP: 1.  You will have labs in 6 months and again in 1 year. 2.  Please keep your office visit appointment again for 1 year from now.  Thank you!  Thank you for choosing [facility] to provide your oncology and hematology care.  To afford each patient quality time with our providers, please arrive at least 15 minutes before your scheduled appointment time.  With your help, our goal is to use those 15 minutes to complete the necessary work-up to ensure our physicians have the information they need to help with your evaluation and healthcare recommendations.    Effective January 1st, 2014, we ask that you re-schedule your appointment with our physicians should you arrive 10 or more minutes late for your appointment.  We strive to give you quality time with our providers, and arriving late affects you and other patients whose appointments are after yours.    Again, thank you for choosing [facility].  Our hope is that these requests will decrease the amount of time that you wait before being seen by our physicians.       _____________________________________________________________  Should you have questions after your visit to [facility], please contact our office at [phone] between the hours of 8:30 a.m. and 5:00 p.m.  Voicemails left after 4:30 p.m. will not be returned until the following business day.  For prescription refill requests, have your pharmacy contact our office with your prescription refill request.

## 2013-03-14 NOTE — Progress Notes (Signed)
[name] Grills, MD [id number] [place] [id number]  Melanoma of skin  DCIS (ductal carcinoma in situ) of breast, right  Iron deficiency anemia, unspecified  CURRENT THERAPY: Observation  INTERVAL HISTORY: [name] [name] 79 y.o. female returns for  regular  visit for followup of  Stage I (T1 A.) melanoma the of left thigh,  superficial spreading-type,  thus far without recurrence with resection in December 2007 and re-resection 08/06/2006. She had negative margins at that time.  AND DCIS of the right breast in June 1998 status post surgical resection and she thinks she had radiation therapy as well.  AND Iron deficiency anemia GI blood losses with colonoscopy revealing friable anal canal hemorrhoids and diverticulosis in March 2010 by Dr. Sydell Axon.  I personally reviewed and went over laboratory results with the patient.  The patient['s labs from July 2014 shows a Hgb of 11.2 g/dL with a normal platelet count and WBC count.  Her MCV is stable.    I personally reviewed and went over radiographic studies with the patient.  Her CT scan of head was negative on 02/07/2013.   Unfortunately the patient is having issues with vertigo.  She has seen her cardiologist who ruled out cardiac origin of this symptoms.  She has since seen ENT who has set her up with PT to help with this disease.  I provided the patient some elementary education regarding vertigo.    "Why did Dr. Tressie Stalker want me to come back in 6 months instead of 1 year like have always done?"  I have reviewed Dr. Jaclyn Prime note and there is no obvious reason for a 6 month return from a hematologic or oncologic standpoint.  Therefore, we will repeat labs in 6 months and 12 months and see her back in 1 year, sooner if needed.   Hematologically and oncologically, she denies any complaints and ROS questioning is negative.    Past Medical History  Diagnosis Date  . History of blood clots     in leg    . History of knee surgery   . Mini stroke   . Hip fracture     hip surgery 2001  . Stomach ulcer     secondary to h.pylori, s/p treatment  . Thyroid condition   . Bladder infection     History  . Kidney infection     History  . Melanoma of thigh     left  . DCIS (ductal carcinoma in situ) of breast     RIGHT BREAST  . Aneurysm, thoracic aortic   . Dehydration     HISTORY   . S/P colonoscopy March 2010    RMR: friable anal canal hemorrhoids, hyperplastic ascending polyp, adenomatous descending polyp   . Fall at home 09/10/12  . Melanoma of skin 03/14/2013  . DCIS (ductal carcinoma in situ) of breast 03/14/2013    Right breast  . Iron deficiency anemia, unspecified 03/14/2013    Secondary to GI blood loss    has LOWER LEG, ARTHRITIS, DEGEN./OSTEO; HIP PAIN; PAIN IN JOINT, LOWER LEG; BURSITIS, HIP; Bladder infection; Anemia; Helicobacter pylori gastritis; S/P total knee replacement; Hip pain; BPPV (benign paroxysmal positional vertigo); RBBB; Ascending aortic aneurysm; Aortic insufficiency; Melanoma of skin; DCIS (ductal carcinoma in situ) of breast; and Iron deficiency anemia, unspecified on her problem list.     is allergic to codeine; iron; and penicillins.  [name] had no medications administered during this visit.  Past  Surgical History  Procedure Laterality Date  . Partial hysterectomy    . Leg surgery    . Breast lumpectomy    . Appendectomy    . Cataract extraction, bilateral    . Melanoma excision      left leg excision  . Tonsillectomy    . Cholecystectomy    . Varicose vein surgery    . Esophagogastroduodenoscopy  06/05/11    small hiatal hernia; + H.PYLORI GASTRITIS, s/p 5 days of Pylera, unable to finish due to N/V  . Colonoscopy  06/05/11    pancolonic diverticulosis/ileal reosion/abnormal anorectal junction s/p biopsy: path for small intestine and Ti was benign with non-villous atrophy, rectal biopsy with prominent prolapse changes, no acute inflammation   . Corneal transplant  2013  . Knee surgery  05/2006    total right  . Total hip arthroplasty  2001&2004     X 2 FOR LEFT HIP    Denies any headaches, dizziness, double vision, fevers, chills, night sweats, nausea, vomiting, diarrhea, constipation, chest pain, heart palpitations, shortness of breath, blood in stool, black tarry stool, urinary pain, urinary burning, urinary frequency, hematuria.   PHYSICAL EXAMINATION  ECOG PERFORMANCE STATUS: 2 - Symptomatic, <50% confined to bed  Filed Vitals:   03/14/13 1000  BP: 149/66  Pulse: 78  Temp: 98.2 F (36.8 C)  Resp: 16    GENERAL:alert, no distress, well nourished, well developed, comfortable, cooperative, obese and smiling SKIN: skin color, texture, turgor are normal, no rashes or significant lesions HEAD: Normocephalic, No masses, lesions, tenderness or abnormalities EYES: normal, PERRLA, EOMI, Conjunctiva are pink and non-injected EARS: External ears normal OROPHARYNX:mucous membranes are moist  NECK: supple, no adenopathy, thyroid normal size, non-tender, without nodularity, no stridor, non-tender, trachea midline LYMPH:  no palpable lymphadenopathy BREAST:not examined LUNGS: clear to auscultation and percussion HEART: regular rate & rhythm, no murmurs, no gallops, S1 normal and S2 normal ABDOMEN:abdomen soft, non-tender, obese and normal bowel sounds BACK: Back symmetric, no curvature., No CVA tenderness EXTREMITIES:less then 2 second capillary refill, no joint deformities, effusion, or inflammation, no edema, no skin discoloration, no clubbing, no cyanosis  NEURO: alert & oriented x 3 with fluent speech, no focal motor/sensory deficits, gait normal with cane for stability    LABORATORY DATA: CBC    Component Value Date/Time   WBC 6.4 02/07/2013 1717   RBC 4.01 02/07/2013 1717   RBC 4.22 02/03/2011 1118   HGB 11.2* 02/07/2013 1717   HCT 35.7* 02/07/2013 1717   PLT 207 02/07/2013 1717   MCV 89.0 02/07/2013 1717   MCH 27.9  02/07/2013 1717   MCHC 31.4 02/07/2013 1717   RDW 14.1 02/07/2013 1717   LYMPHSABS 1.6 02/07/2013 1717   MONOABS 0.5 02/07/2013 1717   EOSABS 0.1 02/07/2013 1717   BASOSABS 0.0 02/07/2013 1717      Chemistry      Component Value Date/Time   NA 141 02/07/2013 1717   K 4.2 02/07/2013 1717   CL 103 02/07/2013 1717   CO2 29 02/07/2013 1717   BUN 18 02/07/2013 1717   CREATININE 0.92 02/07/2013 1717      Component Value Date/Time   CALCIUM 9.1 02/07/2013 1717   ALKPHOS 62 02/07/2013 1717   AST 18 02/07/2013 1717   ALT 9 02/07/2013 1717   BILITOT 0.5 02/07/2013 1717        RADIOGRAPHIC STUDIES:  02/07/2013  *RADIOLOGY REPORT*  Clinical Data: Dizziness, nausea.  CT HEAD WITHOUT CONTRAST  Technique: Contiguous axial images were  obtained from the base of  the skull through the vertex without contrast.  Comparison: MRI 06/06/2008  Findings: Mild chronic microvascular changes throughout the deep  white matter. No acute intracranial abnormality. Specifically, no  hemorrhage, hydrocephalus, mass lesion, acute infarction, or  significant intracranial injury. No acute calvarial abnormality.  Visualized paranasal sinuses and mastoids clear. Orbital soft  tissues unremarkable.  IMPRESSION:  Chronic microvascular changes. No acute intracranial abnormality.  Original Report Authenticated By: Rolm Baptise, M.D.     ASSESSMENT:  1. Stage I (T1 A.) melanoma the of left thigh,  superficial spreading-type,  thus far without recurrence with resection in December 2007 and re-resection 08/06/2006. She had negative margins at that time.  2. DCIS of the right breast in June 1998 status post surgical resection and she thinks she had radiation therapy as well.  3. Iron deficiency anemia GI blood losses with colonoscopy revealing friable anal canal hemorrhoids and diverticulosis in March 2010 by Dr. Sydell Axon  4. Right knee replacement 2007 by Dr. Aline Brochure  5. Left hip replacement x2 by Dr. Eulas Post years ago   Patient Active Problem  List   Diagnosis Date Noted  . Melanoma of skin 03/14/2013  . DCIS (ductal carcinoma in situ) of breast 03/14/2013  . Iron deficiency anemia, unspecified 03/14/2013  . BPPV (benign paroxysmal positional vertigo) 02/10/2013  . RBBB 02/10/2013  . Ascending aortic aneurysm 02/10/2013  . Aortic insufficiency 02/10/2013  . Hip pain 11/02/2012  . S/P total knee replacement 08/14/2011  . Helicobacter pylori gastritis 07/14/2011  . Anemia 02/04/2011  . Bladder infection   . BURSITIS, HIP 05/29/2009  . LOWER LEG, ARTHRITIS, DEGEN./OSTEO 11/23/2007  . HIP PAIN 06/07/2007  . PAIN IN JOINT, LOWER LEG 06/07/2007      PLAN:  1. I personally reviewed and went over laboratory results with the patient. 2. I personally reviewed and went over radiographic studies with the patient. 3. Patient education regarding vertigo.  Continue follow-up with ENT and PCP as directed 4. Labs in 6 months: CBC diff, Iron/TIBC, Ferritin, BMET 5. Labs in 12 months: CBC diff, Iron/TIBC, Ferritin, CMET 6. Return in 1 year for follow-up.   THERAPY PLAN:  Will continue to follow the patient with labs and yearly follow-up visit.  From a hem/onc standpoint, she is stable.  All questions were answered. The patient knows to call the clinic with any problems, questions or concerns. We can certainly see the patient much sooner if necessary.  Patient and plan discussed with Dr. Barbette Reichmann and he is in agreement with the aforementioned.   March Steyer

## 2013-03-16 DIAGNOSIS — R262 Difficulty in walking, not elsewhere classified: Secondary | ICD-10-CM | POA: Diagnosis not present

## 2013-03-16 DIAGNOSIS — R42 Dizziness and giddiness: Secondary | ICD-10-CM | POA: Diagnosis not present

## 2013-03-16 DIAGNOSIS — IMO0001 Reserved for inherently not codable concepts without codable children: Secondary | ICD-10-CM | POA: Diagnosis not present

## 2013-03-18 DIAGNOSIS — R262 Difficulty in walking, not elsewhere classified: Secondary | ICD-10-CM | POA: Diagnosis not present

## 2013-03-18 DIAGNOSIS — R42 Dizziness and giddiness: Secondary | ICD-10-CM | POA: Diagnosis not present

## 2013-03-18 DIAGNOSIS — IMO0001 Reserved for inherently not codable concepts without codable children: Secondary | ICD-10-CM | POA: Diagnosis not present

## 2013-03-21 DIAGNOSIS — R262 Difficulty in walking, not elsewhere classified: Secondary | ICD-10-CM | POA: Diagnosis not present

## 2013-03-21 DIAGNOSIS — IMO0001 Reserved for inherently not codable concepts without codable children: Secondary | ICD-10-CM | POA: Diagnosis not present

## 2013-03-21 DIAGNOSIS — R42 Dizziness and giddiness: Secondary | ICD-10-CM | POA: Diagnosis not present

## 2013-03-23 DIAGNOSIS — R262 Difficulty in walking, not elsewhere classified: Secondary | ICD-10-CM | POA: Diagnosis not present

## 2013-03-23 DIAGNOSIS — IMO0001 Reserved for inherently not codable concepts without codable children: Secondary | ICD-10-CM | POA: Diagnosis not present

## 2013-03-23 DIAGNOSIS — R42 Dizziness and giddiness: Secondary | ICD-10-CM | POA: Diagnosis not present

## 2013-03-28 DIAGNOSIS — IMO0001 Reserved for inherently not codable concepts without codable children: Secondary | ICD-10-CM | POA: Diagnosis not present

## 2013-03-28 DIAGNOSIS — R262 Difficulty in walking, not elsewhere classified: Secondary | ICD-10-CM | POA: Diagnosis not present

## 2013-03-28 DIAGNOSIS — R42 Dizziness and giddiness: Secondary | ICD-10-CM | POA: Diagnosis not present

## 2013-03-30 DIAGNOSIS — R262 Difficulty in walking, not elsewhere classified: Secondary | ICD-10-CM | POA: Diagnosis not present

## 2013-03-30 DIAGNOSIS — IMO0001 Reserved for inherently not codable concepts without codable children: Secondary | ICD-10-CM | POA: Diagnosis not present

## 2013-03-30 DIAGNOSIS — R42 Dizziness and giddiness: Secondary | ICD-10-CM | POA: Diagnosis not present

## 2013-04-27 DIAGNOSIS — I951 Orthostatic hypotension: Secondary | ICD-10-CM | POA: Diagnosis not present

## 2013-04-27 DIAGNOSIS — Z6835 Body mass index (BMI) 35.0-35.9, adult: Secondary | ICD-10-CM | POA: Diagnosis not present

## 2013-04-27 DIAGNOSIS — R634 Abnormal weight loss: Secondary | ICD-10-CM | POA: Diagnosis not present

## 2013-05-09 DIAGNOSIS — R1310 Dysphagia, unspecified: Secondary | ICD-10-CM | POA: Diagnosis not present

## 2013-05-09 DIAGNOSIS — I10 Essential (primary) hypertension: Secondary | ICD-10-CM | POA: Diagnosis not present

## 2013-05-09 DIAGNOSIS — D649 Anemia, unspecified: Secondary | ICD-10-CM | POA: Diagnosis not present

## 2013-05-09 DIAGNOSIS — R5381 Other malaise: Secondary | ICD-10-CM | POA: Diagnosis not present

## 2013-05-09 DIAGNOSIS — Z6834 Body mass index (BMI) 34.0-34.9, adult: Secondary | ICD-10-CM | POA: Diagnosis not present

## 2013-05-09 DIAGNOSIS — E041 Nontoxic single thyroid nodule: Secondary | ICD-10-CM | POA: Diagnosis not present

## 2013-05-09 DIAGNOSIS — Z79899 Other long term (current) drug therapy: Secondary | ICD-10-CM | POA: Diagnosis not present

## 2013-05-09 DIAGNOSIS — R634 Abnormal weight loss: Secondary | ICD-10-CM | POA: Diagnosis not present

## 2013-05-09 DIAGNOSIS — R110 Nausea: Secondary | ICD-10-CM | POA: Diagnosis not present

## 2013-05-16 ENCOUNTER — Other Ambulatory Visit (HOSPITAL_COMMUNITY): Payer: Self-pay | Admitting: Oncology

## 2013-05-16 DIAGNOSIS — D509 Iron deficiency anemia, unspecified: Secondary | ICD-10-CM

## 2013-05-19 ENCOUNTER — Encounter (HOSPITAL_COMMUNITY): Payer: Medicare Other | Attending: Oncology

## 2013-05-19 VITALS — BP 151/63 | HR 87 | Temp 97.5°F | Resp 20

## 2013-05-19 DIAGNOSIS — D509 Iron deficiency anemia, unspecified: Secondary | ICD-10-CM | POA: Insufficient documentation

## 2013-05-19 LAB — CBC WITH DIFFERENTIAL/PLATELET
Basophils Absolute: 0 K/uL (ref 0.0–0.1)
Basophils Relative: 1 % (ref 0–1)
Eosinophils Absolute: 0.1 K/uL (ref 0.0–0.7)
Eosinophils Relative: 1 % (ref 0–5)
HCT: 32.6 % — ABNORMAL LOW (ref 36.0–46.0)
Hemoglobin: 10 g/dL — ABNORMAL LOW (ref 12.0–15.0)
Lymphocytes Relative: 38 % (ref 12–46)
Lymphs Abs: 2.4 K/uL (ref 0.7–4.0)
MCH: 24.5 pg — ABNORMAL LOW (ref 26.0–34.0)
MCHC: 30.7 g/dL (ref 30.0–36.0)
MCV: 79.9 fL (ref 78.0–100.0)
Monocytes Absolute: 0.5 K/uL (ref 0.1–1.0)
Monocytes Relative: 8 % (ref 3–12)
Neutro Abs: 3.2 K/uL (ref 1.7–7.7)
Neutrophils Relative %: 53 % (ref 43–77)
Platelets: 233 K/uL (ref 150–400)
RBC: 4.08 MIL/uL (ref 3.87–5.11)
RDW: 15.8 % — ABNORMAL HIGH (ref 11.5–15.5)
WBC: 6.2 K/uL (ref 4.0–10.5)

## 2013-05-19 MED ORDER — SODIUM CHLORIDE 0.9 % IV SOLN
Freq: Once | INTRAVENOUS | Status: AC
  Administered 2013-05-19: 14:00:00 via INTRAVENOUS

## 2013-05-19 MED ORDER — SODIUM CHLORIDE 0.9 % IV SOLN
1020.0000 mg | Freq: Once | INTRAVENOUS | Status: AC
  Administered 2013-05-19: 1020 mg via INTRAVENOUS
  Filled 2013-05-19: qty 34

## 2013-05-19 NOTE — Progress Notes (Signed)
Tolerated well

## 2013-05-20 LAB — IRON AND TIBC
Iron: 38 ug/dL — ABNORMAL LOW (ref 42–135)
Saturation Ratios: 8 % — ABNORMAL LOW (ref 20–55)
TIBC: 448 ug/dL (ref 250–470)

## 2013-05-20 LAB — FERRITIN: Ferritin: 8 ng/mL — ABNORMAL LOW (ref 10–291)

## 2013-06-07 ENCOUNTER — Encounter (INDEPENDENT_AMBULATORY_CARE_PROVIDER_SITE_OTHER): Payer: Self-pay

## 2013-06-07 ENCOUNTER — Encounter: Payer: Self-pay | Admitting: Gastroenterology

## 2013-06-07 ENCOUNTER — Ambulatory Visit (INDEPENDENT_AMBULATORY_CARE_PROVIDER_SITE_OTHER): Payer: Medicare Other | Admitting: Gastroenterology

## 2013-06-07 VITALS — BP 118/60 | HR 81 | Temp 98.0°F | Wt 178.4 lb

## 2013-06-07 DIAGNOSIS — D509 Iron deficiency anemia, unspecified: Secondary | ICD-10-CM | POA: Insufficient documentation

## 2013-06-07 DIAGNOSIS — R09A2 Foreign body sensation, throat: Secondary | ICD-10-CM | POA: Insufficient documentation

## 2013-06-07 DIAGNOSIS — R6881 Early satiety: Secondary | ICD-10-CM | POA: Diagnosis not present

## 2013-06-07 DIAGNOSIS — R634 Abnormal weight loss: Secondary | ICD-10-CM | POA: Insufficient documentation

## 2013-06-07 DIAGNOSIS — F458 Other somatoform disorders: Secondary | ICD-10-CM | POA: Diagnosis not present

## 2013-06-07 DIAGNOSIS — R0989 Other specified symptoms and signs involving the circulatory and respiratory systems: Secondary | ICD-10-CM | POA: Insufficient documentation

## 2013-06-07 MED ORDER — HYDROCORTISONE 2.5 % RE CREA: 1.0000 "application " | TOPICAL_CREAM | Freq: Two times a day (BID) | RECTAL | Status: DC

## 2013-06-07 NOTE — Progress Notes (Signed)
Referring Provider: Elsie Lincoln, MD Primary Care Physician:  [name], MD Primary GI: Dr. Gala Romney   Chief Complaint  Patient presents with  . Abdominal Pain  . Dysphagia    HPI:   [name] [name] is a pleasant 79 year old female presenting today with worsening IDA. She was last seen in Dec 2012 by our practice. She had undergone an EGD and colonoscopy Nov 2012, with findings of H.pylori gastritis on EGD and abnormal anorectal junction s/p biopsy. This showed prominent prolapse changes, no acute inflammation. TI biopsy was benign. She completed only 5 days of Pylera at that time due to N/V; however, H.pylori stool antigen was obtained to document eradication. This was negative.   Appears ferritin has dropped slowly since last seen, iron with significant drop as well. Hgb 10, down from 12.1 in July of last year. Notes early satiety, eats very little. Lost about 20 lbs this year. Documented in epic as well. Poor appetite. Symptoms since February. Weak with activity. Notes globus sensation. Denies choking with food. Drinking ensure. No odynophagia. Once in awhile notes lower abdominal discomfort, lasting a few months, resolved on its own. No melena. Hematochezia intermittently. No NSAIDs or aspirin powders. Stopped Nexium a few days ago due to tasting the pill in her mouth, regurgitating.   Past Medical History  Diagnosis Date  . History of blood clots     in leg  . History of knee surgery   . Mini stroke   . Hip fracture     hip surgery 2001  . Stomach ulcer     secondary to h.pylori, s/p treatment  . Thyroid condition   . Bladder infection     History  . Kidney infection     History  . Melanoma of thigh     left  . DCIS (ductal carcinoma in situ) of breast     RIGHT BREAST  . Aneurysm, thoracic aortic   . Dehydration     HISTORY   . S/P colonoscopy March 2010    RMR: friable anal canal hemorrhoids, hyperplastic ascending polyp, adenomatous descending polyp   . Fall at  home 09/10/12  . Melanoma of skin 03/14/2013  . DCIS (ductal carcinoma in situ) of breast 03/14/2013    Right breast  . Iron deficiency anemia, unspecified 03/14/2013    Secondary to GI blood loss    Past Surgical History  Procedure Laterality Date  . Partial hysterectomy    . Leg surgery    . Breast lumpectomy    . Appendectomy    . Cataract extraction, bilateral    . Melanoma excision      left leg excision  . Tonsillectomy    . Cholecystectomy    . Varicose vein surgery    . Esophagogastroduodenoscopy  06/05/11    small hiatal hernia; + H.PYLORI GASTRITIS, s/p 5 days of Pylera, unable to finish due to N/V  . Colonoscopy  06/05/11    pancolonic diverticulosis/ileal reosion/abnormal anorectal junction s/p biopsy: path for small intestine and Ti was benign with non-villous atrophy, rectal biopsy with prominent prolapse changes, no acute inflammation  . Corneal transplant  2013    bilaterally  . Knee surgery  05/2006    total right  . Total hip arthroplasty  2001&2004     X 2 FOR LEFT HIP    Current Outpatient Prescriptions  Medication Sig Dispense Refill  . cholecalciferol (VITAMIN D) 1000 UNITS tablet Take 1,000 Units by mouth daily.      Marland Kitchen  clopidogrel (PLAVIX) 75 MG tablet Take 75 mg by mouth daily.        Marland Kitchen gabapentin (NEURONTIN) 100 MG capsule Take 2 capsules (200 mg total) by mouth 3 (three) times daily.  90 capsule  2  . hydrocortisone (ANUSOL-HC) 2.5 % rectal cream Place 1 application rectally 2 (two) times daily as needed for hemorrhoids.      . meclizine (ANTIVERT) 25 MG tablet Take one every 6 hours for dizziness  15 tablet  0  . Multiple Vitamins-Minerals (OPTI-VITAMINS PO) Take 1 tablet by mouth daily.      . Potassium Chloride Crys CR (KLOR-CON M20 PO) Take 20 mEq by mouth 2 (two) times daily.      . promethazine (PHENERGAN) 25 MG tablet Take 25 mg by mouth Every 6 hours as needed for nausea.       . sodium chloride (MURO 128) 2 % ophthalmic solution Place 1 drop into  both eyes 3 (three) times daily.       . traMADol-acetaminophen (ULTRACET) 37.5-325 MG per tablet       . esomeprazole (NEXIUM) 20 MG capsule Take 20 mg by mouth daily before breakfast.       No current facility-administered medications for this visit.    Allergies as of 06/07/2013 - Review Complete 06/07/2013  Allergen Reaction Noted  . Codeine Diarrhea and Nausea Only   . Iron Nausea And Vomiting 02/01/2011  . Penicillins Rash     Family History  Problem Relation Age of Onset  . Colon cancer Neg Hx   . Cancer Daughter     breast cancer  . Cancer Daughter     breast cancer    History   Social History  . Marital Status: Widowed    Spouse Name: N/A    Number of Children: N/A  . Years of Education: N/A   Social History Main Topics  . Smoking status: Former Smoker -- 2.00 packs/day    Types: Cigarettes    Quit date: 08/06/1983  . Smokeless tobacco: Never Used  . Alcohol Use: No  . Drug Use: No  . Sexual Activity: No   Other Topics Concern  . None   Social History Narrative  . None    Review of Systems: Gen: see HPI  CV: occasional chest discomfort, Dr. Debara Pickett aware  Resp: +DOE GI: see HPI Derm: Denies rash, itching, dry skin Psych: Denies depression, anxiety, memory loss, confusion. No homicidal or suicidal ideation.  Heme: Denies bruising, bleeding, and enlarged lymph nodes.  Physical Exam: BP 118/60  Pulse 81  Temp(Src) 98 F (36.7 C) (Oral)  Wt 178 lb 6.4 oz (80.922 kg) General:   Alert and oriented. No distress noted. Pleasant and cooperative.  Head:  Normocephalic and atraumatic. Eyes:  Conjuctiva clear without scleral icterus. Mouth:  Oral mucosa pink and moist. No lesions. Neck:  Supple, without mass or thyromegaly. Heart:  S1, S2 present  Abdomen:  +BS, soft, non-tender and non-distended. No rebound or guarding. No HSM or masses noted. Rectal: declined Msk:  Symmetrical without gross deformities. Normal posture. Extremities:  Trace pedal  edema Neurologic:  Alert and  oriented x4;  grossly normal neurologically. Psych:  Alert and cooperative. Normal mood and affect.   Lab Results  Component Value Date   WBC 6.2 05/19/2013   HGB 10.0* 05/19/2013   HCT 32.6* 05/19/2013   MCV 79.9 05/19/2013   PLT 233 05/19/2013   Lab Results  Component Value Date   IRON 38* 05/19/2013  TIBC 448 05/19/2013   FERRITIN 8* 05/19/2013   Outside labs May 09, 2013:  H.pylori serology positive at 2.18  TSH normal

## 2013-06-07 NOTE — Assessment & Plan Note (Addendum)
78 year old female with history of IDA in the past, s/p EGD and colonoscopy as described in 2012. H.pylori gastritis noted at that time, with incomplete treatment with Pylera due to intolerance to regimen. H.pylori antigen obtained and negative after treatment completion. Not mentioned above, patient lost to follow-up and did not return for 3 month follow-up in 2013.   Now with worsening IDA, early satiety, and weight loss. H.pylori serology positive at 2.18. Globus sensation noted. Needs at minimum EGD with possible dilation in near future. Will add possible capsule study to case request, as small bowel etiology not excluded in the setting of Plavix. Off Nexium due to poor taste in mouth; await EGD findings first. Colonoscopy fairly up-to-date; low volume hematochezia likely benign anorectal source. Anusol suppositories BID; declined rectal exam. May benefit from hemorrhoid banding if appropriate.   Proceed with upper endoscopy and dilation in the near future with Dr. Gala Romney. The risks, benefits, and alternatives have been discussed in detail with patient. They have stated understanding and desire to proceed.  Anticipate capsule study if EGD negative

## 2013-06-07 NOTE — Assessment & Plan Note (Signed)
Concern for gastritis, PUD, occult malignancy. EGD/ED as planned.

## 2013-06-07 NOTE — Patient Instructions (Signed)
We have set you up for an upper endoscopy and possible dilation with Dr. Gala Romney in the near future. You may need a capsule study, which looks at your small intestines closely to evaluate for a source of iron deficiency.   I have sent suppositories to your pharmacy.   Further recommendations once procedure is complete.

## 2013-06-07 NOTE — Assessment & Plan Note (Signed)
Dilation if appropriate at time of EGD. May have underlying motility disorder.

## 2013-06-07 NOTE — Assessment & Plan Note (Signed)
Multifactorial. Concern for occult malignancy remains in differential. EGD/ED as scheduled. Consider CT abd/pelvis in future.

## 2013-06-08 ENCOUNTER — Encounter (HOSPITAL_COMMUNITY)
Admission: RE | Disposition: A | Discharge status: Home / Self Care | Payer: Self-pay | Source: Ambulatory Visit | Attending: Internal Medicine

## 2013-06-08 ENCOUNTER — Encounter (HOSPITAL_COMMUNITY): Payer: Self-pay | Admitting: *Deleted

## 2013-06-08 ENCOUNTER — Ambulatory Visit (HOSPITAL_COMMUNITY)
Admission: RE | Admit: 2013-06-08 | Discharge: 2013-06-08 | Disposition: A | Discharge status: Home / Self Care | Payer: Medicare Other | Source: Ambulatory Visit | Attending: Internal Medicine | Admitting: Internal Medicine

## 2013-06-08 DIAGNOSIS — K449 Diaphragmatic hernia without obstruction or gangrene: Secondary | ICD-10-CM | POA: Diagnosis not present

## 2013-06-08 DIAGNOSIS — D509 Iron deficiency anemia, unspecified: Secondary | ICD-10-CM | POA: Diagnosis not present

## 2013-06-08 DIAGNOSIS — R1310 Dysphagia, unspecified: Secondary | ICD-10-CM | POA: Diagnosis not present

## 2013-06-08 DIAGNOSIS — Q2733 Arteriovenous malformation of digestive system vessel: Secondary | ICD-10-CM | POA: Insufficient documentation

## 2013-06-08 HISTORY — PX: ESOPHAGOGASTRODUODENOSCOPY (EGD) WITH ESOPHAGEAL DILATION: SHX5812

## 2013-06-08 HISTORY — PX: GIVENS CAPSULE STUDY: SHX5432

## 2013-06-08 SURGERY — ESOPHAGOGASTRODUODENOSCOPY (EGD) WITH ESOPHAGEAL DILATION
Anesthesia: Moderate Sedation

## 2013-06-08 MED ORDER — MEPERIDINE HCL 100 MG/ML IJ SOLN
INTRAMUSCULAR | Status: AC
  Filled 2013-06-08: qty 2

## 2013-06-08 MED ORDER — BUTAMBEN-TETRACAINE-BENZOCAINE 2-2-14 % EX AERO
INHALATION_SPRAY | CUTANEOUS | Status: DC | PRN
  Administered 2013-06-08: 2 via TOPICAL

## 2013-06-08 MED ORDER — MIDAZOLAM HCL 5 MG/5ML IJ SOLN
INTRAMUSCULAR | Status: AC
  Filled 2013-06-08: qty 10

## 2013-06-08 MED ORDER — MEPERIDINE HCL 100 MG/ML IJ SOLN
INTRAMUSCULAR | Status: DC | PRN
  Administered 2013-06-08: 50 mg via INTRAVENOUS
  Administered 2013-06-08: 25 mg via INTRAVENOUS

## 2013-06-08 MED ORDER — ONDANSETRON HCL 4 MG/2ML IJ SOLN
INTRAMUSCULAR | Status: AC
  Filled 2013-06-08: qty 2

## 2013-06-08 MED ORDER — ONDANSETRON HCL 4 MG/2ML IJ SOLN
INTRAMUSCULAR | Status: DC | PRN
  Administered 2013-06-08: 4 mg via INTRAVENOUS

## 2013-06-08 MED ORDER — SODIUM CHLORIDE 0.9 % IV SOLN
INTRAVENOUS | Status: DC
  Administered 2013-06-08: 1000 mL via INTRAVENOUS

## 2013-06-08 MED ORDER — MIDAZOLAM HCL 5 MG/5ML IJ SOLN
INTRAMUSCULAR | Status: DC | PRN
  Administered 2013-06-08 (×2): 1 mg via INTRAVENOUS
  Administered 2013-06-08: 2 mg via INTRAVENOUS
  Administered 2013-06-08: 1 mg via INTRAVENOUS

## 2013-06-08 MED ORDER — STERILE WATER FOR IRRIGATION IR SOLN
Status: DC | PRN
  Administered 2013-06-08: 08:00:00

## 2013-06-08 NOTE — Interval H&P Note (Signed)
History and Physical Interval Note:  06/08/2013 7:55 AM  [name] [name]  has presented today for surgery, with the diagnosis of IDA  The various methods of treatment have been discussed with the patient and family. After consideration of risks, benefits and other options for treatment, the patient has consented to  Procedure(s) with comments: ESOPHAGOGASTRODUODENOSCOPY (EGD) WITH ESOPHAGEAL DILATION (N/A) - 7:30 GIVENS CAPSULE STUDY (N/A) as a surgical intervention .  The patient's history has been reviewed, patient examined, no change in status, stable for surgery.  I have reviewed the patient's chart and labs.  Questions were answered to the patient's satisfaction.      No change other than patient reports bad taste in her mouth on Nexium. She stopped taking it and that has improved considerably otherwise, plan for a diagnostic EGD with possible esophageal dilation capsule study as appropriate today.  The risks, benefits, limitations, alternatives and imponderables have been reviewed with the patient. Potential for esophageal dilation, biopsy, etc. have also been reviewed.  Questions have been answered. All parties agreeable.   Manus Rudd

## 2013-06-08 NOTE — H&P (View-Only) (Signed)
Referring Provider: Elsie Lincoln, MD Primary Care Physician:  [name], MD Primary GI: Dr. Gala Romney   Chief Complaint  Patient presents with  . Abdominal Pain  . Dysphagia    HPI:   [name] [name] is a pleasant 79 year old female presenting today with worsening IDA. She was last seen in Dec 2012 by our practice. She had undergone an EGD and colonoscopy Nov 2012, with findings of H.pylori gastritis on EGD and abnormal anorectal junction s/p biopsy. This showed prominent prolapse changes, no acute inflammation. TI biopsy was benign. She completed only 5 days of Pylera at that time due to N/V; however, H.pylori stool antigen was obtained to document eradication. This was negative.   Appears ferritin has dropped slowly since last seen, iron with significant drop as well. Hgb 10, down from 12.1 in July of last year. Notes early satiety, eats very little. Lost about 20 lbs this year. Documented in epic as well. Poor appetite. Symptoms since February. Weak with activity. Notes globus sensation. Denies choking with food. Drinking ensure. No odynophagia. Once in awhile notes lower abdominal discomfort, lasting a few months, resolved on its own. No melena. Hematochezia intermittently. No NSAIDs or aspirin powders. Stopped Nexium a few days ago due to tasting the pill in her mouth, regurgitating.   Past Medical History  Diagnosis Date  . History of blood clots     in leg  . History of knee surgery   . Mini stroke   . Hip fracture     hip surgery 2001  . Stomach ulcer     secondary to h.pylori, s/p treatment  . Thyroid condition   . Bladder infection     History  . Kidney infection     History  . Melanoma of thigh     left  . DCIS (ductal carcinoma in situ) of breast     RIGHT BREAST  . Aneurysm, thoracic aortic   . Dehydration     HISTORY   . S/P colonoscopy March 2010    RMR: friable anal canal hemorrhoids, hyperplastic ascending polyp, adenomatous descending polyp   . Fall at  home 09/10/12  . Melanoma of skin 03/14/2013  . DCIS (ductal carcinoma in situ) of breast 03/14/2013    Right breast  . Iron deficiency anemia, unspecified 03/14/2013    Secondary to GI blood loss    Past Surgical History  Procedure Laterality Date  . Partial hysterectomy    . Leg surgery    . Breast lumpectomy    . Appendectomy    . Cataract extraction, bilateral    . Melanoma excision      left leg excision  . Tonsillectomy    . Cholecystectomy    . Varicose vein surgery    . Esophagogastroduodenoscopy  06/05/11    small hiatal hernia; + H.PYLORI GASTRITIS, s/p 5 days of Pylera, unable to finish due to N/V  . Colonoscopy  06/05/11    pancolonic diverticulosis/ileal reosion/abnormal anorectal junction s/p biopsy: path for small intestine and Ti was benign with non-villous atrophy, rectal biopsy with prominent prolapse changes, no acute inflammation  . Corneal transplant  2013    bilaterally  . Knee surgery  05/2006    total right  . Total hip arthroplasty  2001&2004     X 2 FOR LEFT HIP    Current Outpatient Prescriptions  Medication Sig Dispense Refill  . cholecalciferol (VITAMIN D) 1000 UNITS tablet Take 1,000 Units by mouth daily.      Marland Kitchen  clopidogrel (PLAVIX) 75 MG tablet Take 75 mg by mouth daily.        Marland Kitchen gabapentin (NEURONTIN) 100 MG capsule Take 2 capsules (200 mg total) by mouth 3 (three) times daily.  90 capsule  2  . hydrocortisone (ANUSOL-HC) 2.5 % rectal cream Place 1 application rectally 2 (two) times daily as needed for hemorrhoids.      . meclizine (ANTIVERT) 25 MG tablet Take one every 6 hours for dizziness  15 tablet  0  . Multiple Vitamins-Minerals (OPTI-VITAMINS PO) Take 1 tablet by mouth daily.      . Potassium Chloride Crys CR (KLOR-CON M20 PO) Take 20 mEq by mouth 2 (two) times daily.      . promethazine (PHENERGAN) 25 MG tablet Take 25 mg by mouth Every 6 hours as needed for nausea.       . sodium chloride (MURO 128) 2 % ophthalmic solution Place 1 drop into  both eyes 3 (three) times daily.       . traMADol-acetaminophen (ULTRACET) 37.5-325 MG per tablet       . esomeprazole (NEXIUM) 20 MG capsule Take 20 mg by mouth daily before breakfast.       No current facility-administered medications for this visit.    Allergies as of 06/07/2013 - Review Complete 06/07/2013  Allergen Reaction Noted  . Codeine Diarrhea and Nausea Only   . Iron Nausea And Vomiting 02/01/2011  . Penicillins Rash     Family History  Problem Relation Age of Onset  . Colon cancer Neg Hx   . Cancer Daughter     breast cancer  . Cancer Daughter     breast cancer    History   Social History  . Marital Status: Widowed    Spouse Name: N/A    Number of Children: N/A  . Years of Education: N/A   Social History Main Topics  . Smoking status: Former Smoker -- 2.00 packs/day    Types: Cigarettes    Quit date: 08/06/1983  . Smokeless tobacco: Never Used  . Alcohol Use: No  . Drug Use: No  . Sexual Activity: No   Other Topics Concern  . None   Social History Narrative  . None    Review of Systems: Gen: see HPI  CV: occasional chest discomfort, Dr. Debara Pickett aware  Resp: +DOE GI: see HPI Derm: Denies rash, itching, dry skin Psych: Denies depression, anxiety, memory loss, confusion. No homicidal or suicidal ideation.  Heme: Denies bruising, bleeding, and enlarged lymph nodes.  Physical Exam: BP 118/60  Pulse 81  Temp(Src) 98 F (36.7 C) (Oral)  Wt 178 lb 6.4 oz (80.922 kg) General:   Alert and oriented. No distress noted. Pleasant and cooperative.  Head:  Normocephalic and atraumatic. Eyes:  Conjuctiva clear without scleral icterus. Mouth:  Oral mucosa pink and moist. No lesions. Neck:  Supple, without mass or thyromegaly. Heart:  S1, S2 present  Abdomen:  +BS, soft, non-tender and non-distended. No rebound or guarding. No HSM or masses noted. Rectal: declined Msk:  Symmetrical without gross deformities. Normal posture. Extremities:  Trace pedal  edema Neurologic:  Alert and  oriented x4;  grossly normal neurologically. Psych:  Alert and cooperative. Normal mood and affect.   Lab Results  Component Value Date   WBC 6.2 05/19/2013   HGB 10.0* 05/19/2013   HCT 32.6* 05/19/2013   MCV 79.9 05/19/2013   PLT 233 05/19/2013   Lab Results  Component Value Date   IRON 38* 05/19/2013  TIBC 448 05/19/2013   FERRITIN 8* 05/19/2013   Outside labs May 09, 2013:  H.pylori serology positive at 2.18  TSH normal

## 2013-06-08 NOTE — Op Note (Signed)
[facility] [address] [place], [address]   ENDOSCOPY PROCEDURE REPORT  PATIENT: [name], [name]  MR#: [id number] BIRTHDATE: [birth year] , 89  yrs. old GENDER: Female ENDOSCOPIST: R.  Garfield Cornea, MD FACP FACG REFERRED BY:  Elsie Lincoln, M.D. PROCEDURE DATE:  06/08/2013 PROCEDURE:     EGD with Venia Minks dilation followed by small bowel capsule placement  INDICATIONS:     Esophageal dysphagia; iron deficiency anemia  INFORMED CONSENT:   The risks, benefits, limitations, alternatives and imponderables have been discussed.  The potential for biopsy, esophogeal dilation, etc. have also been reviewed.  Questions have been answered.  All parties agreeable.  Please see the history and physical in the medical record for more information.  MEDICATIONS: Versed 5 mg IV and Demerol 75 mg IV in divided doses. Zofran 4 mg IV. Cetacaine spray.  DESCRIPTION OF PROCEDURE:   The [id number]  endoscope was introduced through the mouth and advanced to the second portion of the duodenum without difficulty or limitations.  The mucosal surfaces were surveyed very carefully during advancement of the scope and upon withdrawal.  Retroflexion view of the proximal stomach and esophagogastric junction was performed.      FINDINGS: Normal appearing tubular esophagus. Stomach empty. Small hiatal hernia. Normal gastric mucosa. Patent pylorus.   Normal first and second portion of the duodenum  THERAPEUTIC / DIAGNOSTIC MANEUVERS PERFORMED:  A 54 French Maloney dilator was passed to full insertion easily. A look back revealed no apparent complication related to this maneuver. Subsequently, the capsule deployment device was loaded up with the capsule and the scope was reintroduced into the stomach, advanced across the pyloric channel. The capsule was deployed into the small bowel uneventfully.   COMPLICATIONS:  None  IMPRESSION:  Small hiatal hernia;  otherwise normal  EGD-status post Maloney dilation -status post Status post capsule deployment into the duodenum.  The patient made the observation recently that Nexium made her feel bad. She stopped taking it and now is doing much better.  RECOMMENDATIONS:  Continue to avoid Nexium-probably not an ideal choice for a PPI with concomitant Plavix therapy. In fact, I recommend she stay off of all proton pump inhibitors for the time being as acid suppression therapy may be impacting her iron deficiency state.  Further recommendations to follow pending review of capsule daily.    _______________________________ R. Garfield Cornea, MD FACP [facility] eSigned:  R. Garfield Cornea, MD FACP [facility] 06/08/2013 8:40 AM     CC:  PATIENT NAME:  [name], [name] MR#: [id number]

## 2013-06-09 ENCOUNTER — Telehealth: Payer: Self-pay | Admitting: Orthopedic Surgery

## 2013-06-09 ENCOUNTER — Telehealth: Payer: Self-pay | Admitting: Gastroenterology

## 2013-06-09 DIAGNOSIS — D509 Iron deficiency anemia, unspecified: Secondary | ICD-10-CM

## 2013-06-09 DIAGNOSIS — K922 Gastrointestinal hemorrhage, unspecified: Secondary | ICD-10-CM | POA: Diagnosis not present

## 2013-06-09 NOTE — Telephone Encounter (Signed)
Discussed with Dr. Gala Romney. Needs EGD with enteroscopy using pediatric colonoscopy on Monday if possible; please arrange.  Please let patient know she will be having her procedure next week.

## 2013-06-09 NOTE — Telephone Encounter (Signed)
Capsule study reviewed and procedure note in epic.  I spoke with patient first, who denies any abdominal pain, N/V, melena, or rectal bleeding. States she feels at her baseline. No complaints. I have asked her not to eat anything after midnight in case a procedure is scheduled tomorrow. I also told her I was off tomorrow, but our office would be calling in the morning with the next step.   I then called Joaquim Lai, her daughter, and reviewed in detail the study. I have asked her to monitor for any evidence of overt GI bleeding such as melena, bright red blood, fatigue, etc. Currently, patient is doing well. I told Joaquim Lai we would be in touch tomorrow regarding the next step in the plan of care. She is aware the signs and symptoms that would necessitate urgent evaluation.

## 2013-06-09 NOTE — Telephone Encounter (Signed)
Received call from atient's daughter and contact person, Clarene Reamer (release of information form on file), following up on refill of Gabapentin from [facility], Linna Hoff; states that pharmacy has faxed 3 times.  Also, checking on Tramadol refill, asking if this medication can be refilled as well.  Joaquim Lai' ph# is [phone].

## 2013-06-09 NOTE — Procedures (Signed)
Small Bowel Givens Capsule Study Procedure date:  06/08/13  Referring Provider:  Laban Emperor, NP/Dr. Gala Romney  PCP:  Dr. [name], MD  Indication for procedure:   79 year old female with history of IDA in the recent past, undergoing EGD and colonoscopy in 2012. Previously treated for H.pylori gastritis in 2012 with negative stool antigen obtained after treatment. Worsening IDA noted recently, with ferritin of 8 and trending down over the past year. Hgb 10, which is down about a gram from several months ago. Symptoms include early satiety, weight loss, and vague dysphagia and globus sensation. EGD performed 11/5 with small hiatal hernia, s/p empiric dilation. Normal gastric mucosa, with normal first and second portions of the duodenum noted. As planned, capsule was deployed into the small bowel due to normal EGD. Capsule study now undertaken due to recurrent IDA. As of note, patient is on Plavix, which raised concern for small bowel etiology.   Findings:   Capsule study complete to the cecum. First image at 00:00:00 revealed bright red blood, streaky, active bleeding/oozing from what appears to be a possible AVM. Multiple other oozing AVMs were noted up until almost 2 hours into the study. Scattered lymphangiectasias were also noted starting at 3:12:40. Towards the more distal small bowel,possible superficial ulcerations noted. Incidentally, a polypoid-like protrusion noted at 4:23:59, and another possible polyp-like structure was noted at 4:30:51. Both of these areas could potentially be secondary to a normal variation of small bowel anatomy. Scattered debris noted at various points throughout the study, decreasing adequate visualization at times.   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 Questionable polyp-like structures. To discuss with Dr. Gala Romney next step; anticipate EGD with enteroscopy to address the primary lesion in more proximal small bowel. May ultimately need tertiary referral for consideration of push enteroscopy, which could provide therapeutic intervention and assessment of possible polyps. Patient at high risk for continued bleeding issues in the future due to presence of Plavix. Recommend avoidance of absolutely all NSAIDs and any aspirin powders. Further therapeutic intervention to be discussed with Dr. Gala Romney. As of note, patient is without any evidence of melena and last Hgb was 10 on 05/19/13.   Orvil Feil, ANP-BC [facility]

## 2013-06-10 ENCOUNTER — Other Ambulatory Visit: Payer: Self-pay | Admitting: *Deleted

## 2013-06-10 ENCOUNTER — Other Ambulatory Visit: Payer: Self-pay | Admitting: Gastroenterology

## 2013-06-10 DIAGNOSIS — M25562 Pain in left knee: Secondary | ICD-10-CM

## 2013-06-10 DIAGNOSIS — M25552 Pain in left hip: Secondary | ICD-10-CM

## 2013-06-10 DIAGNOSIS — K639 Disease of intestine, unspecified: Secondary | ICD-10-CM

## 2013-06-10 DIAGNOSIS — M5136 Other intervertebral disc degeneration, lumbar region: Secondary | ICD-10-CM

## 2013-06-10 DIAGNOSIS — M48061 Spinal stenosis, lumbar region without neurogenic claudication: Secondary | ICD-10-CM

## 2013-06-10 MED ORDER — GABAPENTIN 100 MG PO CAPS: 200.0000 mg | ORAL_CAPSULE | Freq: Three times a day (TID) | ORAL | Status: DC

## 2013-06-10 MED ORDER — TRAMADOL-ACETAMINOPHEN 37.5-325 MG PO TABS: 1.0000 | ORAL_TABLET | Freq: Four times a day (QID) | ORAL | Status: DC | PRN

## 2013-06-10 NOTE — Telephone Encounter (Signed)
Per Joaquim Lai, Mrs Jubb daughter she didn't mind if Dr. Oneida Alar perform the procedure, she just wants to have it done ASAP.  Per Vicente Males the patient should have procedure done the first of the week.  Darius Bump, please schedule the patient with Dr. Oneida Alar on Monday.

## 2013-06-10 NOTE — Telephone Encounter (Signed)
Patient is scheduled with [facility] per RMR and Rosendo Gros on Monday Nov 10th and her daughter Joaquim Lai is aware

## 2013-06-10 NOTE — Telephone Encounter (Signed)
Fine with me

## 2013-06-10 NOTE — Telephone Encounter (Signed)
Medications sent in to pharmacy. Left message for Joaquim Lai that prescriptions were sent to pharmacy.

## 2013-06-10 NOTE — Telephone Encounter (Signed)
Dr. Gala Romney is off Monday do I put her with Fields?

## 2013-06-10 NOTE — Telephone Encounter (Signed)
Dr. Gala Romney your first day back next week is Thursday, are you opposed to Dr. Oneida Alar performing the EGD on this patient next if the patient agrees.

## 2013-06-11 NOTE — Telephone Encounter (Signed)
REVIEWED.  

## 2013-06-13 ENCOUNTER — Ambulatory Visit (HOSPITAL_COMMUNITY)
Admission: RE | Admit: 2013-06-13 | Discharge: 2013-06-13 | Disposition: A | Discharge status: Home / Self Care | Payer: Medicare Other | Source: Ambulatory Visit | Attending: Gastroenterology | Admitting: Gastroenterology

## 2013-06-13 ENCOUNTER — Encounter (HOSPITAL_COMMUNITY)
Admission: RE | Disposition: A | Discharge status: Home / Self Care | Payer: Self-pay | Source: Ambulatory Visit | Attending: Gastroenterology

## 2013-06-13 ENCOUNTER — Encounter (HOSPITAL_COMMUNITY): Payer: Self-pay | Admitting: *Deleted

## 2013-06-13 DIAGNOSIS — K639 Disease of intestine, unspecified: Secondary | ICD-10-CM

## 2013-06-13 DIAGNOSIS — K264 Chronic or unspecified duodenal ulcer with hemorrhage: Secondary | ICD-10-CM | POA: Insufficient documentation

## 2013-06-13 DIAGNOSIS — K2990 Gastroduodenitis, unspecified, without bleeding: Secondary | ICD-10-CM

## 2013-06-13 DIAGNOSIS — D509 Iron deficiency anemia, unspecified: Secondary | ICD-10-CM | POA: Insufficient documentation

## 2013-06-13 DIAGNOSIS — K2970 Gastritis, unspecified, without bleeding: Secondary | ICD-10-CM

## 2013-06-13 DIAGNOSIS — K922 Gastrointestinal hemorrhage, unspecified: Secondary | ICD-10-CM | POA: Diagnosis not present

## 2013-06-13 DIAGNOSIS — K269 Duodenal ulcer, unspecified as acute or chronic, without hemorrhage or perforation: Secondary | ICD-10-CM

## 2013-06-13 HISTORY — PX: ENTEROSCOPY: SHX5533

## 2013-06-13 HISTORY — PX: ESOPHAGOGASTRODUODENOSCOPY: SHX5428

## 2013-06-13 SURGERY — EGD (ESOPHAGOGASTRODUODENOSCOPY)
Anesthesia: Moderate Sedation

## 2013-06-13 MED ORDER — BUTAMBEN-TETRACAINE-BENZOCAINE 2-2-14 % EX AERO
INHALATION_SPRAY | CUTANEOUS | Status: DC | PRN
  Administered 2013-06-13: 2 via TOPICAL

## 2013-06-13 MED ORDER — MEPERIDINE HCL 100 MG/ML IJ SOLN
INTRAMUSCULAR | Status: AC
  Filled 2013-06-13: qty 2

## 2013-06-13 MED ORDER — MIDAZOLAM HCL 5 MG/5ML IJ SOLN
INTRAMUSCULAR | Status: DC | PRN
  Administered 2013-06-13 (×2): 2 mg via INTRAVENOUS
  Administered 2013-06-13 (×3): 1 mg via INTRAVENOUS

## 2013-06-13 MED ORDER — MIDAZOLAM HCL 5 MG/5ML IJ SOLN
INTRAMUSCULAR | Status: AC
  Filled 2013-06-13: qty 10

## 2013-06-13 MED ORDER — SODIUM CHLORIDE 0.9 % IV SOLN
INTRAVENOUS | Status: DC
  Administered 2013-06-13: 13:00:00 via INTRAVENOUS

## 2013-06-13 MED ORDER — STERILE WATER FOR IRRIGATION IR SOLN
Status: DC | PRN
  Administered 2013-06-13: 14:00:00

## 2013-06-13 MED ORDER — MEPERIDINE HCL 100 MG/ML IJ SOLN
INTRAMUSCULAR | Status: DC | PRN
  Administered 2013-06-13 (×2): 25 mg via INTRAVENOUS

## 2013-06-13 MED ORDER — PANTOPRAZOLE SODIUM 40 MG PO TBEC: DELAYED_RELEASE_TABLET | ORAL | Status: DC

## 2013-06-13 NOTE — Progress Notes (Signed)
cc'd to pcp 

## 2013-06-13 NOTE — Op Note (Signed)
[facility] [address] [place], [address]   OPERATIVE PROCEDURE REPORT  PATIENT: [name] [name], [name] [name]  MR#: [id number] BIRTHDATE: 19-Sep-1922 , 89  yrs. old GENDER: Female ENDOSCOPIST: Barney Drain, MD REFERRED BY:  Elsie Lincoln, M.D.  Garfield Cornea, M.D. PROCEDURE DATE: 06/13/2013 PROCEDURE:   Small bowel enteroscopy with control of bleeding ASA CLASS: INDICATIONS:1.  upper G.I.  bleeding. I PERSONALLY REVIEWED CAPSULE. ACTIVE BLEEDING SEEN IMMEDIATELY AS CAPSULE ENTERED BULB. MULTIPLE RED SPOTS/? AVMs SEEN DISTAL TO BULB. COMPLETE STUDY.  MEDICATIONS: Demerol and Versed 7 mg IV TOPICAL ANESTHETIC:   Cetacaine Spray  DESCRIPTION OF PROCEDURE:   After the risks benefits and alternatives of the procedure were thoroughly explained, informed consent was obtained.  The [id number]  endoscope was introduced through the mouth  and advanced to the proximal jejunum jejunum , limited by Without limitations.   The instrument was slowly withdrawn as the mucosa was fully examined.    Gastritis was found Blood was found in the bulb and descending duodenum.  Bipolar cautery was performed with a 7 fr probe(20W). Retroflexed views revealed no abnormalities.    The scope was then withdrawn from the patient and the procedure terminated. SCOPE PASSED TO 130-140 CM FROM THE TEETH AND STRAIGHTENED TO 80 CM AND WITHDRAWN. BICAP APPLIED IN BULB TO ONE OOZING ARE AND ONE ULCERS WITH ?CHERRY RED SPOT. ADDITIONAL SMALL ULCERS SEEN IN DUODENAL BULB.  COMPLICATIONS: There were no complications. ENDOSCOPIC IMPRESSION: 1.   MILD GastritiS 2.   GI BLEED MOST LIKELY DUE TO DUODENAL ULCERS, AND ? AVMs  RECOMMENDATIONS:  BID PPI FOR 3 MOS THEN DAILY. HOLD PLAVIX. RESTART NOV 16. CONSIDER H PYLORI STOOL AG. OPV IN 3 MOS WITH 2 MOS WITH DR. Gala Romney.  REPEAT EXAM:  _______________________________ Lorrin MaisBarney Drain, MD 06/13/2013 2:56 PM   CC:

## 2013-06-13 NOTE — H&P (Signed)
Primary Care Physician:  [name], MD Primary Gastroenterologist:  Dr. Oneida Alar  Pre-Procedure History & Physical: HPI:  [name] [name] is a 79 y.o. female here for GI BLEED.  Past Medical History  Diagnosis Date  . History of blood clots     in leg  . History of knee surgery   . Mini stroke   . Hip fracture     hip surgery 2001  . Stomach ulcer     secondary to h.pylori, s/p treatment  . Thyroid condition   . Bladder infection     History  . Kidney infection     History  . Melanoma of thigh     left  . DCIS (ductal carcinoma in situ) of breast     RIGHT BREAST  . Aneurysm, thoracic aortic   . Dehydration     HISTORY   . S/P colonoscopy March 2010    RMR: friable anal canal hemorrhoids, hyperplastic ascending polyp, adenomatous descending polyp   . Fall at home 09/10/12  . Melanoma of skin 03/14/2013  . DCIS (ductal carcinoma in situ) of breast 03/14/2013    Right breast  . Iron deficiency anemia, unspecified 03/14/2013    Secondary to GI blood loss    Past Surgical History  Procedure Laterality Date  . Partial hysterectomy    . Leg surgery    . Breast lumpectomy    . Appendectomy    . Cataract extraction, bilateral    . Melanoma excision      left leg excision  . Tonsillectomy    . Cholecystectomy    . Varicose vein surgery    . Esophagogastroduodenoscopy  06/05/11    small hiatal hernia; + H.PYLORI GASTRITIS, s/p 5 days of Pylera, unable to finish due to N/V  . Colonoscopy  06/05/11    pancolonic diverticulosis/ileal reosion/abnormal anorectal junction s/p biopsy: path for small intestine and Ti was benign with non-villous atrophy, rectal biopsy with prominent prolapse changes, no acute inflammation  . Corneal transplant  2013    bilaterally  . Knee surgery  05/2006    total right  . Total hip arthroplasty  2001&2004     X 2 FOR LEFT HIP    Prior to Admission medications   Medication Sig Start Date End Date Taking? Authorizing Provider   cholecalciferol (VITAMIN D) 1000 UNITS tablet Take 1,000 Units by mouth daily.   Yes Historical Provider, MD  clopidogrel (PLAVIX) 75 MG tablet Take 75 mg by mouth daily.     Yes Historical Provider, MD  gabapentin (NEURONTIN) 100 MG capsule Take 2 capsules (200 mg total) by mouth 3 (three) times daily. 06/10/13  Yes Carole Civil, MD  hydrocortisone (PROCTOSOL HC) 2.5 % rectal cream Place 1 application rectally 2 (two) times daily. 06/07/13  Yes Orvil Feil, NP  Multiple Vitamins-Minerals (OPTI-VITAMINS PO) Take 1 tablet by mouth daily.   Yes Historical Provider, MD  promethazine (PHENERGAN) 25 MG tablet Take 25 mg by mouth Every 6 hours as needed for nausea.  02/26/12  Yes Historical Provider, MD  sodium chloride (MURO 128) 2 % ophthalmic solution Place 1 drop into both eyes 3 (three) times daily.    Yes Historical Provider, MD  traMADol-acetaminophen (ULTRACET) 37.5-325 MG per tablet Take 1 tablet by mouth every 6 (six) hours as needed. 06/10/13  Yes Carole Civil, MD  meclizine (ANTIVERT) 25 MG tablet Take one every 6 hours for dizziness 02/07/13   Maudry Diego, MD  Potassium Chloride  Crys CR (KLOR-CON M20 PO) Take 20 mEq by mouth 2 (two) times daily.    Historical Provider, MD    Allergies as of 06/10/2013 - Review Complete 06/08/2013  Allergen Reaction Noted  . Codeine Diarrhea and Nausea Only   . Iron Nausea And Vomiting 02/01/2011  . Penicillins Rash     Family History  Problem Relation Age of Onset  . Colon cancer Neg Hx   . Cancer Daughter     breast cancer  . Cancer Daughter     breast cancer    History   Social History  . Marital Status: Widowed    Spouse Name: N/A    Number of Children: N/A  . Years of Education: N/A   Occupational History  . Not on file.   Social History Main Topics  . Smoking status: Former Smoker -- 1.50 packs/day for 20 years    Types: Cigarettes    Quit date: 08/06/1983  . Smokeless tobacco: Never Used  . Alcohol Use: No  . Drug  Use: No  . Sexual Activity: No   Other Topics Concern  . Not on file   Social History Narrative  . No narrative on file    Review of Systems: See HPI, otherwise negative ROS   Physical Exam: BP 169/60  Pulse 59  Temp(Src) 98.3 F (36.8 C) (Oral)  Resp 20  Ht 5\' 6"  (1.676 m)  Wt 178 lb (80.74 kg)  BMI 28.74 kg/m2  SpO2 92% General:   Alert,  pleasant and cooperative in NAD Head:  Normocephalic and atraumatic. Neck:  Supple; Lungs:  Clear throughout to auscultation.    Heart:  Regular rate and rhythm. Abdomen:  Soft, nontender and nondistended. Normal bowel sounds, without guarding, and without rebound.   Neurologic:  Alert and  oriented x4;  grossly normal neurologically.  Impression/Plan:     GI BLEED: SB AVMS  PLAN: ENTEROSCOPY TODAY

## 2013-06-14 ENCOUNTER — Encounter (HOSPITAL_COMMUNITY): Payer: Self-pay | Admitting: Internal Medicine

## 2013-06-16 ENCOUNTER — Encounter (HOSPITAL_COMMUNITY): Payer: Self-pay | Admitting: Gastroenterology

## 2013-07-18 ENCOUNTER — Other Ambulatory Visit: Payer: Self-pay | Admitting: Orthopedic Surgery

## 2013-07-18 DIAGNOSIS — M5136 Other intervertebral disc degeneration, lumbar region: Secondary | ICD-10-CM

## 2013-07-18 MED ORDER — TRAMADOL-ACETAMINOPHEN 37.5-325 MG PO TABS: 1.0000 | ORAL_TABLET | Freq: Four times a day (QID) | ORAL | Status: DC | PRN

## 2013-07-27 ENCOUNTER — Encounter: Payer: Self-pay | Admitting: *Deleted

## 2013-07-29 DIAGNOSIS — J069 Acute upper respiratory infection, unspecified: Secondary | ICD-10-CM | POA: Diagnosis not present

## 2013-07-29 DIAGNOSIS — Z6834 Body mass index (BMI) 34.0-34.9, adult: Secondary | ICD-10-CM | POA: Diagnosis not present

## 2013-07-29 DIAGNOSIS — B9789 Other viral agents as the cause of diseases classified elsewhere: Secondary | ICD-10-CM | POA: Diagnosis not present

## 2013-08-08 ENCOUNTER — Other Ambulatory Visit (HOSPITAL_COMMUNITY): Payer: Self-pay | Admitting: General Surgery

## 2013-08-08 DIAGNOSIS — Z139 Encounter for screening, unspecified: Secondary | ICD-10-CM

## 2013-08-09 ENCOUNTER — Ambulatory Visit (INDEPENDENT_AMBULATORY_CARE_PROVIDER_SITE_OTHER): Payer: Medicare Other | Admitting: Internal Medicine

## 2013-08-09 ENCOUNTER — Encounter: Payer: Self-pay | Admitting: Internal Medicine

## 2013-08-09 VITALS — BP 158/70 | HR 65 | Ht 66.0 in | Wt 177.0 lb

## 2013-08-09 DIAGNOSIS — I359 Nonrheumatic aortic valve disorder, unspecified: Secondary | ICD-10-CM | POA: Diagnosis not present

## 2013-08-09 DIAGNOSIS — I712 Thoracic aortic aneurysm, without rupture, unspecified: Secondary | ICD-10-CM | POA: Diagnosis not present

## 2013-08-09 DIAGNOSIS — I4510 Unspecified right bundle-branch block: Secondary | ICD-10-CM | POA: Diagnosis not present

## 2013-08-09 DIAGNOSIS — I351 Nonrheumatic aortic (valve) insufficiency: Secondary | ICD-10-CM

## 2013-08-09 DIAGNOSIS — I7121 Aneurysm of the ascending aorta, without rupture: Secondary | ICD-10-CM

## 2013-08-09 NOTE — Patient Instructions (Signed)
Your physician wants you to follow-up in: 1 year. You will receive a reminder letter in the mail two months in advance. If you don't receive a letter, please call our office to schedule the follow-up appointment.  

## 2013-08-11 ENCOUNTER — Encounter: Payer: Self-pay | Admitting: Internal Medicine

## 2013-08-11 NOTE — Progress Notes (Signed)
OFFICE NOTE  Chief Complaint:  Dizziness, nausea  Primary Care Physician: [name], MD  HPI:  [name]  is an 80 year old female who is here for a followup visit. At last visit actually she had had a fall in the Spring and twisted her ankle. She had a what sounds like syncopal episode in the middle of the night after going to the bathroom after urinating, stood up, turned quickly in the bathroom, had a little bit of dizziness and then fell but reports she does not remember the episode. It sounds like she may have had micturition syncope; however, the fact that there was some prodrome, arrhythmia or other type of event cannot entirely be ruled out. We are also following her of course for a history of ascending aortic aneurysm which measured 4.2 x 4.2 by CT exam in 2012 with a normal EF and mild aortic insufficiency seen on echo. She denies any chest pain or worsening shortness of breath. Recently she's been having more dizziness and was seen by her primary care provider. EKG was noted to be a right bundle branch block, which was considered new and she was sent here for evaluation. In fact I saw her last February and she was in a right bundle branch block at that time. Her symptoms include vertigo and associated nausea especially with change in position. She does have significant hearing loss with a hearing aid in her right ear, however she cannot hear very well despite that. It has been over a year since she's been evaluated by an audiologist.  [name] returns today and is doing fairly well. Her EKG shows persistent atrial fibrillation with anterolateral and inferior T wave inversions, which likely represent strain. She occasionally gets short of breath with exertion and does have persistent lower extremity swelling.  PMHx:  Past Medical History  Diagnosis Date  . History of blood clots     in leg  . History of knee surgery   . Mini stroke   . Hip fracture     hip surgery  2001  . Stomach ulcer     secondary to h.pylori, s/p treatment  . Thyroid condition   . Bladder infection     h/o  . Kidney infection     h/o  . Melanoma of thigh     left  . Aneurysm, thoracic aortic   . Dehydration     HISTORY   . S/P colonoscopy March 2010    RMR: friable anal canal hemorrhoids, hyperplastic ascending polyp, adenomatous descending polyp   . Fall at home 09/10/12  . Melanoma of skin 03/14/2013  . DCIS (ductal carcinoma in situ) of breast 03/14/2013    right breast  . Iron deficiency anemia, unspecified 03/14/2013    secondary to GI blood loss  . Ascending aortic aneurysm     CT in 2012 - 4.2x4.2cm  . Aortic insufficiency   . Venous stasis     edema  . Hypertension     Past Surgical History  Procedure Laterality Date  . Partial hysterectomy  1976  . Leg surgery    . Breast lumpectomy  1998  . Appendectomy  1942  . Cataract extraction, bilateral    . Melanoma excision Left 08/2006    left leg excision  . Tonsillectomy    . Cholecystectomy    . Varicose vein surgery    . Esophagogastroduodenoscopy  06/05/11    small hiatal hernia; + H.PYLORI GASTRITIS, s/p 5 days of Pylera,  unable to finish due to N/V  . Colonoscopy  06/05/11    pancolonic diverticulosis/ileal reosion/abnormal anorectal junction s/p biopsy: path for small intestine and Ti was benign with non-villous atrophy, rectal biopsy with prominent prolapse changes, no acute inflammation  . Corneal transplant Bilateral 2013  . Knee surgery Right 05/2006    total right  . Total hip arthroplasty Left 2001&2004     X 2 FOR LEFT HIP  . Esophagogastroduodenoscopy (egd) with esophageal dilation N/A 06/08/2013    Procedure: ESOPHAGOGASTRODUODENOSCOPY (EGD) WITH ESOPHAGEAL DILATION;  Surgeon: Daneil Dolin, MD;  [facility];  Service: Endoscopy;  Laterality: N/A;  7:30  . Givens capsule study N/A 06/08/2013    Procedure: GIVENS CAPSULE STUDY;  Surgeon: Daneil Dolin, MD;  Location: [facility];   Service: Endoscopy;  Laterality: N/A;  . Esophagogastroduodenoscopy N/A 06/13/2013    Procedure: ESOPHAGOGASTRODUODENOSCOPY (EGD);  Surgeon: Danie Binder, MD;  [facility];  Service: Endoscopy;  Laterality: N/A;  2:45  . Enteroscopy N/A 06/13/2013    Procedure: ENTEROSCOPY;  Surgeon: Danie Binder, MD;  [facility]: [facility];  Service: Endoscopy;  Laterality: N/A;  USE PED SCOPE  . Transthoracic echocardiogram  10/06/2012    EF 55-60%, mod eccentric hypertrophy, grade 2 diastolic dysfunction; mildly calcifed AV annulus with moderate regurg; aortic root mildly dilated; LA severely dailted; PA peak pressure 73mHg  . Nm myocar perf wall motion  03/27/2010    dipyridamole; small reversible basal to mid-septal defect (?artifact), post-stress EF 55%, low risk scan     FAMHx:  Family History  Problem Relation Age of Onset  . Colon cancer Neg Hx   . Breast cancer Daughter     also hyperlipidemia  . Breast cancer Daughter     also hyperlipidemia  . Asthma Mother   . Multiple sclerosis Child   . Hyperlipidemia Child     SOCHx:   reports that she quit smoking about 38 years ago. Her smoking use included Cigarettes. She has a 30 pack-year smoking history. She has never used smokeless tobacco. She reports that she does not drink alcohol or use illicit drugs.  ALLERGIES:  Allergies  Allergen Reactions  . Codeine Diarrhea and Nausea Only  . Iron Nausea And Vomiting    Oral iron causes nausea and vomiting  . Asa [Aspirin] Rash and Other (See Comments)    Petechiae   . Penicillins Rash    ROS: A comprehensive review of systems was negative except for: Ears, nose, mouth, throat, and face: positive for hearing loss Respiratory: positive for dyspnea on exertion Cardiovascular: positive for lower extremity edema Neurological: positive for dizziness  HOME MEDS: Current Outpatient Prescriptions  Medication Sig Dispense Refill  . clopidogrel (PLAVIX) 75 MG tablet Take 75 mg by  mouth daily with breakfast.      . gabapentin (NEURONTIN) 100 MG capsule Take 2 capsules (200 mg total) by mouth 3 (three) times daily.  90 capsule  2  . Multiple Vitamins-Minerals (OPTI-VITAMINS PO) Take 1 tablet by mouth daily.      . pantoprazole (PROTONIX) 40 MG tablet 1 PO 30 MINUTES PRIOR TO BREAKFAST QD  31 tablet  2  . promethazine (PHENERGAN) 25 MG tablet Take 25 mg by mouth Every 6 hours as needed for nausea.       . sodium chloride (MURO 128) 2 % ophthalmic solution Place 1 drop into both eyes 3 (three) times daily.       . Tetrahydrozoline-Zn Sulfate (  EYE DROPS RELIEF OP) Apply to eye 3 (three) times daily.      . traMADol-acetaminophen (ULTRACET) 37.5-325 MG per tablet Take 1 tablet by mouth every 6 (six) hours as needed.  120 tablet  5   No current facility-administered medications for this visit.    LABS/IMAGING: No results found for this or any previous visit (from the past 48 hour(s)). No results found.  VITALS: BP 158/70  Pulse 65  Ht 5\' 6"  (1.676 m)  Wt 177 lb (80.287 kg)  BMI 28.58 kg/m2  EXAM: General appearance: alert and no distress Neck: no adenopathy, no carotid bruit, no JVD, supple, symmetrical, trachea midline and thyroid not enlarged, symmetric, no tenderness/mass/nodules Lungs: clear to auscultation bilaterally Heart: regular rate and rhythm, S1, S2 normal, no murmur, click, rub or gallop Abdomen: soft, non-tender; bowel sounds normal; no masses,  no organomegaly and obese Extremities: extremities normal, atraumatic, 1+ bilateral edema Pulses: 2+ and symmetric Skin: Skin color, texture, turgor normal. No rashes or lesions Neurologic: Mental status: Alert, oriented, thought content appropriate HEENT: right hearing aid in place  EKG: Normal sinus rhythm at 65 with a right bundle branch block, LAFB  ASSESSMENT: 1. Chronic right bundle-branch block & LAFB 2. Benign paroxysmal positional vertigo 3. Hearing loss 4. Dizziness and associated  nausea 5. Stable thoracic aortic aneurysm 6. Mild aortic insufficiency 7. LE Edema  PLAN: 1.   [name] [name] is doing well except for some occasional shortness of breath and lower extremity edema. This appears to be fairly stable and I recommended elevation and or compression for her. I would not change any of her medications at this time. Her aneurysm was stable based on her last CT scan and was reviewed in July. I plan to see her in 6 months to one year.  Pixie Casino, MD, [facility] Attending Cardiologist [facility] 08/11/2013, 5:37 PM

## 2013-08-12 ENCOUNTER — Encounter: Payer: Self-pay | Admitting: Internal Medicine

## 2013-08-23 ENCOUNTER — Encounter: Payer: Self-pay | Admitting: Gastroenterology

## 2013-08-23 ENCOUNTER — Ambulatory Visit (INDEPENDENT_AMBULATORY_CARE_PROVIDER_SITE_OTHER): Payer: Medicare Other | Admitting: Gastroenterology

## 2013-08-23 VITALS — BP 148/62 | HR 69 | Temp 97.6°F | Wt 175.0 lb

## 2013-08-23 DIAGNOSIS — D509 Iron deficiency anemia, unspecified: Secondary | ICD-10-CM | POA: Diagnosis not present

## 2013-08-23 DIAGNOSIS — K649 Unspecified hemorrhoids: Secondary | ICD-10-CM | POA: Diagnosis not present

## 2013-08-23 DIAGNOSIS — R195 Other fecal abnormalities: Secondary | ICD-10-CM | POA: Diagnosis not present

## 2013-08-23 MED ORDER — HYDROCORTISONE 2.5 % RE CREA: 1.0000 "application " | TOPICAL_CREAM | Freq: Two times a day (BID) | RECTAL | Status: DC

## 2013-08-23 NOTE — Patient Instructions (Signed)
1. Hemorrhoid suppository twice daily for 2 weeks. Prescription sent to your pharmacy. 2. I will followup on your blood work which is scheduled for February. 3. Followup with Dr. Gala Romney in 3 months.

## 2013-08-23 NOTE — Progress Notes (Signed)
Primary Care Physician: [name], MD  Primary Gastroenterologist:  Garfield Cornea, MD   Chief Complaint  Patient presents with  . Follow-up  . Hemorrhoids    HPI: [name] [name] is a 80 y.o. female here for followup of iron deficiency anemia. She was seen in November 2014 due to drop and her ferritin and iron levels. Also 20 pound weight loss, anorexia. History of H. pylori gastritis with incomplete treatment with Pylera due to intolerance to regimen in 2012. Subsequent negative H. pylori stool antigen. After last office visit she underwent EGD with capsule placement. On EGD she had a hiatal hernia but otherwise unremarkable. Small bowel capsule showed multiple AVMs with oozing/bleeding. Questionable polyp-like structures in the more distal small bowel (2 of them). She had a followup EGD with enteroscopy on 06/13/2013 that showed mild gastritis, blood found in the bulb and descending duodenum status post BiCAP. Multiple small duodenal ulcers.  Presents today stating that she feels better. Bm regular. No straining. No abdominal pain. Rare nausea. Appetite much improved. No heartburn. No dysphagia. Last iron infusion 05/2013? She never took pantoprazole twice a day but has been taking it daily religiously. Weight loss has slowed, only 3 pounds since 06/2013. Total of 23 pounds in one year. CXR 02/2013 showed COPD.  Current Outpatient Prescriptions  Medication Sig Dispense Refill  . clopidogrel (PLAVIX) 75 MG tablet Take 75 mg by mouth daily with breakfast.      . gabapentin (NEURONTIN) 100 MG capsule Take 2 capsules (200 mg total) by mouth 3 (three) times daily.  90 capsule  2  . Multiple Vitamins-Minerals (OPTI-VITAMINS PO) Take 1 tablet by mouth daily.      . pantoprazole (PROTONIX) 40 MG tablet 1 PO 30 MINUTES PRIOR TO BREAKFAST QD  31 tablet  2  . promethazine (PHENERGAN) 25 MG tablet Take 25 mg by mouth Every 6 hours as needed for nausea.       . sodium chloride (MURO 128)  2 % ophthalmic solution Place 1 drop into both eyes 3 (three) times daily.       Bethann Humble Sulfate (EYE DROPS RELIEF OP) Apply to eye 3 (three) times daily.      . traMADol-acetaminophen (ULTRACET) 37.5-325 MG per tablet Take 1 tablet by mouth every 6 (six) hours as needed.  120 tablet  5   No current facility-administered medications for this visit.    Allergies as of 08/23/2013 - Review Complete 08/23/2013  Allergen Reaction Noted  . Codeine Diarrhea and Nausea Only   . Iron Nausea And Vomiting 02/01/2011  . Asa [aspirin] Rash and Other (See Comments) 07/27/2013  . Penicillins Rash    Past Surgical History  Procedure Laterality Date  . Partial hysterectomy  1976  . Leg surgery    . Breast lumpectomy  1998  . Appendectomy  1942  . Cataract extraction, bilateral    . Melanoma excision Left 08/2006    left leg excision  . Tonsillectomy    . Cholecystectomy    . Varicose vein surgery    . Esophagogastroduodenoscopy  06/05/11    small hiatal hernia; + H.PYLORI GASTRITIS, s/p 5 days of Pylera, unable to finish due to N/V  . Colonoscopy  06/05/11    pancolonic diverticulosis/ileal reosion/abnormal anorectal junction s/p biopsy: path for small intestine and Ti was benign with non-villous atrophy, rectal biopsy with prominent prolapse changes, no acute inflammation  . Corneal transplant Bilateral 2013  . Knee surgery Right 05/2006  total right  . Total hip arthroplasty Left 2001&2004     X 2 FOR LEFT HIP  . Esophagogastroduodenoscopy (egd) with esophageal dilation N/A 06/08/2013    [id number] hiatal hernia;  otherwise normal EGD s/p dilation  . Givens capsule study N/A 06/08/2013    Procedure: GIVENS CAPSULE STUDY;  Surgeon: Daneil Dolin, MD;  [facility];  Service: Endoscopy;  Laterality: N/A;  . Esophagogastroduodenoscopy N/A 06/13/2013    Procedure: ESOPHAGOGASTRODUODENOSCOPY (EGD);  Surgeon: Danie Binder, MD;  [facility];  Service: Endoscopy;   Laterality: N/A;  2:45  . Enteroscopy N/A 06/13/2013    [id number] Gastritis/GI BLEED MOST LIKELY DUE TO DUODENAL ULCERS, AND ? AVMs  . Transthoracic echocardiogram  10/06/2012    EF 55-60%, mod eccentric hypertrophy, grade 2 diastolic dysfunction; mildly calcifed AV annulus with moderate regurg; aortic root mildly dilated; LA severely dailted; PA peak pressure 74mHg  . Nm myocar perf wall motion  03/27/2010    dipyridamole; small reversible basal to mid-septal defect (?artifact), post-stress EF 55%, low risk scan     ROS:  General: Negative for anorexia,  fever, chills, fatigue, weakness. Weight down 3 pounds since 06/2013. Weighed 198 09/2012.  ENT: Negative for hoarseness, difficulty swallowing , nasal congestion. CV: Negative for chest pain, angina, palpitations, dyspnea on exertion, peripheral edema.  Respiratory: Negative for dyspnea at rest, dyspnea on exertion, cough, sputum, wheezing.  GI: See history of present illness. GU:  Negative for dysuria, hematuria, urinary incontinence, urinary frequency, nocturnal urination.  Endo: Negative for unusual weight change.    Physical Examination:   BP 148/62  Pulse 69  Temp(Src) 97.6 F (36.4 C) (Oral)  Wt 175 lb (79.379 kg)  General: Well-nourished, well-developed in no acute distress.  Eyes: No icterus. Mouth: Oropharyngeal mucosa moist and pink , no lesions erythema or exudate. Lungs: Clear to auscultation bilaterally.  Heart: Regular rate and rhythm, no murmurs rubs or gallops.  Abdomen: Bowel sounds are normal, nontender, nondistended, no hepatosplenomegaly or masses, no abdominal bruits or hernia , no rebound or guarding.   RECTAL: significant hemorrhoidal tissue noted, one area with excoriations. Nontender, normal internal exam. Extremities: No lower extremity edema. No clubbing or deformities. Neuro: Alert and oriented x 4   Skin: Warm and dry, no jaundice.   Psych: Alert and cooperative, normal mood and affect.  Labs:  Lab  Results  Component Value Date   WBC 6.2 05/19/2013   HGB 10.0* 05/19/2013   HCT 32.6* 05/19/2013   MCV 79.9 05/19/2013   PLT 233 05/19/2013   Lab Results  Component Value Date   IRON 38* 05/19/2013   TIBC 448 05/19/2013   FERRITIN 8* 05/19/2013   Lab Results  Component Value Date   CREATININE 0.92 02/07/2013   BUN 18 02/07/2013   NA 141 02/07/2013   K 4.2 02/07/2013   CL 103 02/07/2013   CO2 29 02/07/2013   Lab Results  Component Value Date   ALT 9 02/07/2013   AST 18 02/07/2013   ALKPHOS 62 02/07/2013   BILITOT 0.5 02/07/2013     Imaging Studies: No results found.

## 2013-08-24 ENCOUNTER — Encounter: Payer: Self-pay | Admitting: Gastroenterology

## 2013-08-24 NOTE — Assessment & Plan Note (Signed)
Intermittent brbpr related to hemorrhoids. Try topical regimen. Discusses possible banding if ongoing symptoms.

## 2013-08-24 NOTE — Progress Notes (Signed)
cc'd to pcp 

## 2013-08-24 NOTE — Assessment & Plan Note (Addendum)
80 year old lady with history of iron deficiency anemia requiring iron infusions due to intolerance to oral regimen. Recent EGD, small bowel capsule, EGD with enteroscopy showed multiple small bowel AVMs and duodenal ulcers as noted above. Patient has been on PPI daily. She is feeling better with improved appetite. She is due for followup labs in a couple of weeks for the hematologist. We will await those lab results. She has had continued weight loss although it has slowed at this point. Questionable 2 polypoid lesions on small bowel capsule as previously noted. Likely insignificant. I will discuss further with Dr. Gala Romney, given her ongoing weight loss, consider CT enterography as next step. We'll also consider repeat H. pylori stool antigen given duodenal ulcers, await labs.  OV with Dr. Gala Romney in 3 months.

## 2013-08-29 NOTE — Progress Notes (Signed)
Please let patient know, per RMR, she needs CTE to further evaluate polyps in distal small bowel on capsule AND weight loss.   Please schedule CTE. She will need current creatinine.

## 2013-09-02 ENCOUNTER — Other Ambulatory Visit: Payer: Self-pay | Admitting: Gastroenterology

## 2013-09-02 DIAGNOSIS — K6389 Other specified diseases of intestine: Secondary | ICD-10-CM

## 2013-09-02 NOTE — Progress Notes (Signed)
Tried to call pt- LMOM 

## 2013-09-05 ENCOUNTER — Other Ambulatory Visit: Payer: Self-pay | Admitting: Gastroenterology

## 2013-09-05 DIAGNOSIS — K6389 Other specified diseases of intestine: Secondary | ICD-10-CM

## 2013-09-05 NOTE — Progress Notes (Signed)
CT E is scheduled for Friday Feb 6th at 2:00 and [name] [name] Daughter is aware

## 2013-09-06 ENCOUNTER — Telehealth: Payer: Self-pay

## 2013-09-06 DIAGNOSIS — D1330 Benign neoplasm of unspecified part of small intestine: Secondary | ICD-10-CM | POA: Diagnosis not present

## 2013-09-06 LAB — CREATININE, SERUM: CREATININE: 0.8 mg/dL (ref 0.50–1.10)

## 2013-09-06 NOTE — Telephone Encounter (Signed)
Opened in error

## 2013-09-07 DIAGNOSIS — C439 Malignant melanoma of skin, unspecified: Secondary | ICD-10-CM | POA: Diagnosis not present

## 2013-09-07 DIAGNOSIS — D509 Iron deficiency anemia, unspecified: Secondary | ICD-10-CM | POA: Diagnosis not present

## 2013-09-07 DIAGNOSIS — D0590 Unspecified type of carcinoma in situ of unspecified breast: Secondary | ICD-10-CM | POA: Diagnosis not present

## 2013-09-08 ENCOUNTER — Ambulatory Visit (HOSPITAL_COMMUNITY)
Admission: RE | Admit: 2013-09-08 | Discharge: 2013-09-08 | Disposition: A | Discharge status: Home / Self Care | Payer: Medicare Other | Source: Ambulatory Visit | Attending: General Surgery | Admitting: General Surgery

## 2013-09-08 ENCOUNTER — Other Ambulatory Visit (HOSPITAL_COMMUNITY): Payer: Self-pay | Admitting: Oncology

## 2013-09-08 DIAGNOSIS — Z1231 Encounter for screening mammogram for malignant neoplasm of breast: Secondary | ICD-10-CM | POA: Insufficient documentation

## 2013-09-08 DIAGNOSIS — Z139 Encounter for screening, unspecified: Secondary | ICD-10-CM

## 2013-09-08 DIAGNOSIS — D509 Iron deficiency anemia, unspecified: Secondary | ICD-10-CM

## 2013-09-09 ENCOUNTER — Ambulatory Visit (HOSPITAL_COMMUNITY)
Admission: RE | Admit: 2013-09-09 | Discharge: 2013-09-09 | Disposition: A | Discharge status: Home / Self Care | Payer: Medicare Other | Source: Ambulatory Visit | Attending: Gastroenterology | Admitting: Gastroenterology

## 2013-09-09 ENCOUNTER — Other Ambulatory Visit (HOSPITAL_COMMUNITY): Payer: Self-pay | Admitting: Gastroenterology

## 2013-09-09 DIAGNOSIS — K6389 Other specified diseases of intestine: Secondary | ICD-10-CM

## 2013-09-09 DIAGNOSIS — N281 Cyst of kidney, acquired: Secondary | ICD-10-CM | POA: Diagnosis not present

## 2013-09-09 DIAGNOSIS — K5730 Diverticulosis of large intestine without perforation or abscess without bleeding: Secondary | ICD-10-CM | POA: Diagnosis not present

## 2013-09-09 DIAGNOSIS — D1330 Benign neoplasm of unspecified part of small intestine: Secondary | ICD-10-CM | POA: Insufficient documentation

## 2013-09-09 DIAGNOSIS — N289 Disorder of kidney and ureter, unspecified: Secondary | ICD-10-CM | POA: Diagnosis not present

## 2013-09-09 MED ORDER — IOHEXOL 300 MG/ML  SOLN
125.0000 mL | Freq: Once | INTRAMUSCULAR | Status: AC | PRN
  Administered 2013-09-09: 125 mL via INTRAVENOUS

## 2013-09-09 MED ORDER — BARIUM SULFATE 0.1 % PO SUSP: 450.0000 mL | Freq: Once | ORAL | Status: DC

## 2013-09-13 ENCOUNTER — Telehealth: Payer: Self-pay

## 2013-09-13 ENCOUNTER — Other Ambulatory Visit: Payer: Self-pay | Admitting: Gastroenterology

## 2013-09-13 DIAGNOSIS — N289 Disorder of kidney and ureter, unspecified: Secondary | ICD-10-CM

## 2013-09-13 MED ORDER — ONDANSETRON HCL 4 MG PO TABS: 4.0000 mg | ORAL_TABLET | Freq: Three times a day (TID) | ORAL | Status: DC | PRN

## 2013-09-13 NOTE — Telephone Encounter (Signed)
Let's find where her last labs were done. I don't see any recent ones in EPIC.  Family needs to verify if patient is on anything for nausea such as phenergan, compazine, zofran. I don't feel comfortable giving something if they are not sure if she is on anything already.  Let's have her increase her pantoprazole to BID for 30 days only.

## 2013-09-13 NOTE — Telephone Encounter (Signed)
pts daughter- Joaquim Lai- called back after giving her test results this morning. She said she wanted to let LSL know that they called her from the hospital last week and pt has to go for another iron infusion this week because her iron is low. Also she is having a lot of nausea in the mornings and no appetite. Pt is not vomiting. She is not sure if she is taking anything for the nausea or not.   Joaquim Lai has a doctors appt this morning and said I could call her sister back if there are any further recommendations. [phone])

## 2013-09-13 NOTE — Telephone Encounter (Signed)
Spoke with pts daughterHelene Kelp- she said pt is not currently taking anything for nausea and they would appreciate something called in for her.   Also, she said the labs were done at [facility] in the [place]. I did not see any labs in there for her.   Chelsey, will you call the cancer center and see if they have any recent labs on her. Thanks.

## 2013-09-13 NOTE — Telephone Encounter (Signed)
zofran called in.

## 2013-09-14 ENCOUNTER — Other Ambulatory Visit (HOSPITAL_COMMUNITY): Payer: Medicare Other

## 2013-09-15 ENCOUNTER — Encounter (HOSPITAL_COMMUNITY): Payer: Medicare Other | Attending: Oncology

## 2013-09-15 VITALS — BP 170/61 | HR 64 | Temp 98.0°F | Resp 18

## 2013-09-15 DIAGNOSIS — D509 Iron deficiency anemia, unspecified: Secondary | ICD-10-CM | POA: Diagnosis not present

## 2013-09-15 MED ORDER — SODIUM CHLORIDE 0.9 % IV SOLN
Freq: Once | INTRAVENOUS | Status: AC
  Administered 2013-09-15: 12:00:00 via INTRAVENOUS

## 2013-09-15 MED ORDER — SODIUM CHLORIDE 0.9 % IV SOLN
1020.0000 mg | Freq: Once | INTRAVENOUS | Status: AC
  Administered 2013-09-15: 1020 mg via INTRAVENOUS
  Filled 2013-09-15: qty 34

## 2013-09-15 NOTE — Telephone Encounter (Signed)
I have not received copy of labs yet.

## 2013-09-15 NOTE — Progress Notes (Signed)
Tolerated well

## 2013-09-16 ENCOUNTER — Ambulatory Visit (HOSPITAL_COMMUNITY)
Admission: RE | Admit: 2013-09-16 | Discharge: 2013-09-16 | Disposition: A | Discharge status: Home / Self Care | Payer: Medicare Other | Source: Ambulatory Visit | Attending: Gastroenterology | Admitting: Gastroenterology

## 2013-09-16 DIAGNOSIS — R160 Hepatomegaly, not elsewhere classified: Secondary | ICD-10-CM | POA: Diagnosis not present

## 2013-09-16 DIAGNOSIS — R9389 Abnormal findings on diagnostic imaging of other specified body structures: Secondary | ICD-10-CM | POA: Diagnosis not present

## 2013-09-16 DIAGNOSIS — N289 Disorder of kidney and ureter, unspecified: Secondary | ICD-10-CM | POA: Insufficient documentation

## 2013-09-16 MED ORDER — GADOBENATE DIMEGLUMINE 529 MG/ML IV SOLN
16.0000 mL | Freq: Once | INTRAVENOUS | Status: AC | PRN
  Administered 2013-09-16: 16 mL via INTRAVENOUS

## 2013-09-19 NOTE — Telephone Encounter (Signed)
Requested labs, spoke with Angie at [facility] cancer center, she said she will fax labs

## 2013-09-28 NOTE — Telephone Encounter (Signed)
No. I have not seen labs yet and we need ASAP. It has been over two weeks.

## 2013-09-28 NOTE — Telephone Encounter (Signed)
Did you get pt labs yet

## 2013-09-28 NOTE — Telephone Encounter (Signed)
Requested labs again, they are sending the most recent ones they have which is back in October. Cancer center is going to fax them right over. I made them aware we have been trying to get her labs from them for 2 weeks.

## 2013-10-03 ENCOUNTER — Other Ambulatory Visit: Payer: Self-pay | Admitting: *Deleted

## 2013-10-03 NOTE — Telephone Encounter (Signed)
Pt needs pantoprazole refilled, pt uses [facility] in [place]. Please advise [phone]

## 2013-10-04 MED ORDER — PANTOPRAZOLE SODIUM 40 MG PO TBEC: 40.0000 mg | DELAYED_RELEASE_TABLET | Freq: Two times a day (BID) | ORAL | Status: DC

## 2013-10-05 NOTE — Telephone Encounter (Signed)
[name] [name],  Please clarify. Daughter specified that patient had labs in 09/2013 and is getting more iron. [facility] center said most recent labs were 05/2013.  She received iron infusion on 09/15/13.  Could they had just given her iron without labs???

## 2013-10-06 NOTE — Telephone Encounter (Signed)
I called pts daughter and she swears that she had it done in February. I called [facility] and the only labs they have are from October. I called [facility] lab and they do have some labs dated 09/06/13 and she will fax them to [place].

## 2013-10-07 ENCOUNTER — Ambulatory Visit (INDEPENDENT_AMBULATORY_CARE_PROVIDER_SITE_OTHER): Payer: Medicare Other | Admitting: Urology

## 2013-10-07 DIAGNOSIS — N289 Disorder of kidney and ureter, unspecified: Secondary | ICD-10-CM | POA: Diagnosis not present

## 2013-10-07 DIAGNOSIS — D4959 Neoplasm of unspecified behavior of other genitourinary organ: Secondary | ICD-10-CM | POA: Diagnosis not present

## 2013-10-11 ENCOUNTER — Other Ambulatory Visit (HOSPITAL_COMMUNITY): Payer: Self-pay | Admitting: Oncology

## 2013-10-11 ENCOUNTER — Telehealth (HOSPITAL_COMMUNITY): Payer: Self-pay

## 2013-10-11 DIAGNOSIS — D649 Anemia, unspecified: Secondary | ICD-10-CM

## 2013-10-11 NOTE — Telephone Encounter (Signed)
Patient's daughter notified of need for lab work this week. Is scheduled for Friday at 9:50.

## 2013-10-14 ENCOUNTER — Encounter (HOSPITAL_COMMUNITY): Payer: Medicare Other | Attending: Oncology

## 2013-10-14 DIAGNOSIS — D649 Anemia, unspecified: Secondary | ICD-10-CM | POA: Insufficient documentation

## 2013-10-14 LAB — RETICULOCYTES
RBC.: 4.15 MIL/uL (ref 3.87–5.11)
Retic Count, Absolute: 87.2 10*3/uL (ref 19.0–186.0)
Retic Ct Pct: 2.1 % (ref 0.4–3.1)

## 2013-10-14 LAB — CBC WITH DIFFERENTIAL/PLATELET
BASOS ABS: 0 10*3/uL (ref 0.0–0.1)
Basophils Relative: 1 % (ref 0–1)
Eosinophils Absolute: 0.1 10*3/uL (ref 0.0–0.7)
Eosinophils Relative: 2 % (ref 0–5)
HEMATOCRIT: 39 % (ref 36.0–46.0)
HEMOGLOBIN: 12.9 g/dL (ref 12.0–15.0)
LYMPHS PCT: 33 % (ref 12–46)
Lymphs Abs: 1.4 10*3/uL (ref 0.7–4.0)
MCH: 31.1 pg (ref 26.0–34.0)
MCHC: 33.1 g/dL (ref 30.0–36.0)
MCV: 94 fL (ref 78.0–100.0)
Monocytes Absolute: 0.3 10*3/uL (ref 0.1–1.0)
Monocytes Relative: 7 % (ref 3–12)
NEUTROS ABS: 2.5 10*3/uL (ref 1.7–7.7)
Neutrophils Relative %: 58 % (ref 43–77)
Platelets: 147 10*3/uL — ABNORMAL LOW (ref 150–400)
RBC: 4.15 MIL/uL (ref 3.87–5.11)
RDW: 15 % (ref 11.5–15.5)
WBC: 4.4 10*3/uL (ref 4.0–10.5)

## 2013-10-14 LAB — HAPTOGLOBIN: HAPTOGLOBIN: 30 mg/dL — AB (ref 45–215)

## 2013-10-14 LAB — LACTATE DEHYDROGENASE: LDH: 188 U/L (ref 94–250)

## 2013-10-14 LAB — FERRITIN: FERRITIN: 326 ng/mL — AB (ref 10–291)

## 2013-10-14 NOTE — Progress Notes (Signed)
Labs drawn today for cbc/diff,ldh,retic,haptoglobin,ferr

## 2013-10-17 DIAGNOSIS — R110 Nausea: Secondary | ICD-10-CM | POA: Diagnosis not present

## 2013-10-17 DIAGNOSIS — N289 Disorder of kidney and ureter, unspecified: Secondary | ICD-10-CM | POA: Diagnosis not present

## 2013-10-17 DIAGNOSIS — Z6833 Body mass index (BMI) 33.0-33.9, adult: Secondary | ICD-10-CM | POA: Diagnosis not present

## 2013-10-17 DIAGNOSIS — R630 Anorexia: Secondary | ICD-10-CM | POA: Diagnosis not present

## 2013-10-18 ENCOUNTER — Encounter: Payer: Self-pay | Admitting: Oncology

## 2013-10-20 ENCOUNTER — Other Ambulatory Visit (HOSPITAL_COMMUNITY): Payer: Self-pay | Admitting: Oncology

## 2013-10-21 NOTE — Telephone Encounter (Signed)
Finally received labs from hematology dated 09/07/2013. Hemoglobin 13.4, hematocrit 40.3, MCV 90, iron 76, TIBC 397. Ferritin 16.  Please go ahead and schedule her followup with Dr. Gala Romney for April. Reason for appointment, weight loss, nausea, iron deficiency

## 2013-10-24 NOTE — Telephone Encounter (Signed)
Pt has an appointment on April 28 at 2:00 with RMR. Will mail her a letter.

## 2013-10-25 ENCOUNTER — Telehealth (HOSPITAL_COMMUNITY): Payer: Self-pay

## 2013-10-25 ENCOUNTER — Encounter (HOSPITAL_BASED_OUTPATIENT_CLINIC_OR_DEPARTMENT_OTHER): Payer: Medicare Other

## 2013-10-25 ENCOUNTER — Other Ambulatory Visit (HOSPITAL_COMMUNITY)
Admission: RE | Admit: 2013-10-25 | Discharge: 2013-10-25 | Disposition: A | Discharge status: Home / Self Care | Payer: Medicare Other | Source: Ambulatory Visit | Attending: Hematology and Oncology | Admitting: Hematology and Oncology

## 2013-10-25 DIAGNOSIS — D696 Thrombocytopenia, unspecified: Secondary | ICD-10-CM | POA: Insufficient documentation

## 2013-10-25 DIAGNOSIS — D65 Disseminated intravascular coagulation [defibrination syndrome]: Secondary | ICD-10-CM | POA: Diagnosis not present

## 2013-10-25 NOTE — Progress Notes (Signed)
Previous telephone notation for critical lab value EPI was erroneous, please disregard.

## 2013-10-25 NOTE — Telephone Encounter (Signed)
CRITICAL VALUE ALERT Critical value received:  EPI greater than 300 Date of notification:  10/25/13 Time of notification: 12 Critical value read back:  yes Nurse who received alert:  Mickie Kay, RN Robynn Pane, PA- C

## 2013-10-25 NOTE — Progress Notes (Signed)
Labs drawn today for flow,pyruvate kinase,glucose 6,urine hemosiderin

## 2013-10-26 LAB — GLUCOSE 6 PHOSPHATE DEHYDROGENASE: G6PDH: 12.7 U/g{Hb} (ref 7.0–20.5)

## 2013-10-29 LAB — HEMOSIDERIN, URINE

## 2013-11-01 LAB — MISCELLANEOUS TEST

## 2013-11-10 LAB — MISCELLANEOUS TEST

## 2013-11-11 ENCOUNTER — Telehealth (HOSPITAL_COMMUNITY): Payer: Self-pay | Admitting: *Deleted

## 2013-11-11 NOTE — Telephone Encounter (Signed)
Patient's daughter called to inquire about labs done on 10/25/2012. I told her that everything looked negative. The flow cytometry result was up front waiting to be scanned. Is there anything else I need to tell them, do we need to see her any sooner than August?

## 2013-11-17 DIAGNOSIS — H353290 Exudative age-related macular degeneration, unspecified eye, stage unspecified: Secondary | ICD-10-CM | POA: Diagnosis not present

## 2013-11-29 ENCOUNTER — Ambulatory Visit (INDEPENDENT_AMBULATORY_CARE_PROVIDER_SITE_OTHER): Payer: Medicare Other | Admitting: Internal Medicine

## 2013-11-29 ENCOUNTER — Encounter: Payer: Self-pay | Admitting: Hematology and Oncology

## 2013-11-29 ENCOUNTER — Encounter: Payer: Self-pay | Admitting: Internal Medicine

## 2013-11-29 ENCOUNTER — Encounter (INDEPENDENT_AMBULATORY_CARE_PROVIDER_SITE_OTHER): Payer: Self-pay

## 2013-11-29 ENCOUNTER — Telehealth: Payer: Self-pay | Admitting: Internal Medicine

## 2013-11-29 VITALS — BP 139/66 | HR 70 | Temp 97.6°F | Ht 63.0 in | Wt 174.2 lb

## 2013-11-29 DIAGNOSIS — R110 Nausea: Secondary | ICD-10-CM

## 2013-11-29 DIAGNOSIS — K921 Melena: Secondary | ICD-10-CM

## 2013-11-29 NOTE — Progress Notes (Signed)
Primary Care Physician:  [name], MD Primary Gastroenterologist:  Dr. Gala Romney  Pre-Procedure History & Physical: HPI:  [name] [name] is a 80 y.o. female here for followup of nausea. Takes Zofran each morning as an episode of nausea about once weekly. Transient lasting only several minutes. No heaving or actual vomiting. Really not having any abdominal pain he states bowels are moving regularly has paper hematochezia multiple times weekly felt to be secondary to hemorrhoids - known hemorrhoids documented. Topical agents have not really made much difference. Likely has a renal cell carcinoma. She is being surveilled via MRI through oncology.  Bowels moving regularly with the help of Metamucil daily.   History of iron deficiency anemia. History of duodenal ulceration. History of H. pylori treated. H&H is now normalized. She is accompanied by her daughter today who corroborates her overall improvement.  Past Medical History  Diagnosis Date  . History of blood clots     in leg  . History of knee surgery   . Mini stroke   . Hip fracture     hip surgery 2001  . Stomach ulcer     secondary to h.pylori, s/p treatment  . Thyroid condition   . Bladder infection     h/o  . Kidney infection     h/o  . Melanoma of thigh     left  . Aneurysm, thoracic aortic   . Dehydration     HISTORY   . S/P colonoscopy March 2010    RMR: friable anal canal hemorrhoids, hyperplastic ascending polyp, adenomatous descending polyp   . Fall at home 09/10/12  . Melanoma of skin 03/14/2013  . DCIS (ductal carcinoma in situ) of breast 03/14/2013    right breast  . Iron deficiency anemia, unspecified 03/14/2013    secondary to GI blood loss  . Ascending aortic aneurysm     CT in 2012 - 4.2x4.2cm  . Aortic insufficiency   . Venous stasis     edema  . Hypertension     Past Surgical History  Procedure Laterality Date  . Partial hysterectomy  1976  . Leg surgery    . Breast lumpectomy  1998  .  Appendectomy  1942  . Cataract extraction, bilateral    . Melanoma excision Left 08/2006    left leg excision  . Tonsillectomy    . Cholecystectomy    . Varicose vein surgery    . Esophagogastroduodenoscopy  06/05/11    small hiatal hernia; + H.PYLORI GASTRITIS, s/p 5 days of Pylera, unable to finish due to N/V  . Colonoscopy  06/05/11    pancolonic diverticulosis/ileal reosion/abnormal anorectal junction s/p biopsy: path for small intestine and Ti was benign with non-villous atrophy, rectal biopsy with prominent prolapse changes, no acute inflammation  . Corneal transplant Bilateral 2013  . Knee surgery Right 05/2006    total right  . Total hip arthroplasty Left 2001&2004     X 2 FOR LEFT HIP  . Esophagogastroduodenoscopy (egd) with esophageal dilation N/A 06/08/2013    [id number] hiatal hernia;  otherwise normal EGD s/p dilation  . Givens capsule study N/A 06/08/2013    Procedure: GIVENS CAPSULE STUDY;  Surgeon: Daneil Dolin, MD;  Location: [facility];  Service: Endoscopy;  Laterality: N/A;  . Esophagogastroduodenoscopy N/A 06/13/2013    Procedure: ESOPHAGOGASTRODUODENOSCOPY (EGD);  Surgeon: Danie Binder, MD;  Location: [facility];  Service: Endoscopy;  Laterality: N/A;  2:45  . Enteroscopy N/A 06/13/2013    [id number] Gastritis/GI BLEED  MOST LIKELY DUE TO DUODENAL ULCERS, AND ? AVMs  . Transthoracic echocardiogram  10/06/2012    EF 55-60%, mod eccentric hypertrophy, grade 2 diastolic dysfunction; mildly calcifed AV annulus with moderate regurg; aortic root mildly dilated; LA severely dailted; PA peak pressure 89mHg  . Nm myocar perf wall motion  03/27/2010    dipyridamole; small reversible basal to mid-septal defect (?artifact), post-stress EF 55%, low risk scan     Prior to Admission medications   Medication Sig Start Date End Date Taking? Authorizing Provider  clopidogrel (PLAVIX) 75 MG tablet Take 75 mg by mouth daily with breakfast.   Yes Historical Provider, MD  gabapentin  (NEURONTIN) 100 MG capsule Take 2 capsules (200 mg total) by mouth 3 (three) times daily. 06/10/13  Yes Carole Civil, MD  Multiple Vitamins-Minerals (OPTI-VITAMINS PO) Take 1 tablet by mouth daily.   Yes Historical Provider, MD  ondansetron (ZOFRAN) 4 MG tablet Take 1 tablet (4 mg total) by mouth every 8 (eight) hours as needed for nausea or vomiting. 09/13/13  Yes Mahala Menghini, PA-C  pantoprazole (PROTONIX) 40 MG tablet Take 1 tablet (40 mg total) by mouth 2 (two) times daily. 10/04/13  Yes Orvil Feil, NP  promethazine (PHENERGAN) 25 MG tablet Take 25 mg by mouth Every 6 hours as needed for nausea.  02/26/12  Yes Historical Provider, MD  psyllium (METAMUCIL) 58.6 % packet Take 1 packet by mouth 2 (two) times daily.   Yes Historical Provider, MD  sodium chloride (MURO 128) 2 % ophthalmic solution Place 1 drop into both eyes 3 (three) times daily.    Yes Historical Provider, MD  Tetrahydrozoline-Zn Sulfate (EYE DROPS RELIEF OP) Apply to eye 3 (three) times daily.   Yes Historical Provider, MD  traMADol-acetaminophen (ULTRACET) 37.5-325 MG per tablet Take 1 tablet by mouth every 6 (six) hours as needed. 07/18/13  Yes Carole Civil, MD  hydrocortisone (PROCTOZONE-HC) 2.5 % rectal cream Place 1 application rectally 2 (two) times daily. For 2 weeks 08/23/13   Mahala Menghini, PA-C    Allergies as of 11/29/2013 - Review Complete 11/29/2013  Allergen Reaction Noted  . Codeine Diarrhea and Nausea Only   . Iron Nausea And Vomiting 02/01/2011  . Asa [aspirin] Rash and Other (See Comments) 07/27/2013  . Penicillins Rash     Family History  Problem Relation Age of Onset  . Colon cancer Neg Hx   . Breast cancer Daughter     also hyperlipidemia  . Breast cancer Daughter     also hyperlipidemia  . Asthma Mother   . Multiple sclerosis Child   . Hyperlipidemia Child     History   Social History  . Marital Status: Widowed    Spouse Name: N/A    Number of Children: 5  . Years of Education:  6   Occupational History  .     Social History Main Topics  . Smoking status: Former Smoker -- 1.50 packs/day for 20 years    Types: Cigarettes    Quit date: 07/05/1975  . Smokeless tobacco: Never Used  . Alcohol Use: No  . Drug Use: No  . Sexual Activity: No   Other Topics Concern  . Not on file   Social History Narrative  . No narrative on file    Review of Systems: See HPI, otherwise negative ROS  Physical Exam: BP 139/66  Pulse 70  Temp(Src) 97.6 F (36.4 C) (Oral)  Ht 5\' 3"  (1.6 m)  Wt 174 lb  3.2 oz (79.017 kg)  BMI 30.87 kg/m2 General:   Alert,  Somewhat frail, elderly lady somewhat hard of hearing.  eyes:  Sclera clear, no icterus.   Conjunctiva pink. Ears:  Normal auditory acuity. Nose:  No deformity, discharge,  or lesions. Mouth:  No deformity or lesions. Neck:  Supple; no masses or thyromegaly. No significant cervical adenopathy. Lungs:  Clear throughout to auscultation.   No wheezes, crackles, or rhonchi. No acute distress. Heart:  Regular rate and rhythm; no murmurs, clicks, rubs,  or gallops. Abdomen: (Examined in chair-patient could not get on the examining table) Non-distended, normal bowel sounds.  Soft and nontender without appreciable mass or hepatosplenomegaly.  Pulses:  Normal pulses noted. Extremities:  Without clubbing or edema.  Impression:   Clinically, Ms. Grandt is doing very well from a GI standpoint and her 9th year of life. Intermittent early morning nausea without vomiting quite nonspecific at this time. Zofran is working very well for those symptoms. Her main complaint at this time is low volume paper/painless hematochezia likely secondary hemorrhoids in the setting of Plavix therapy. She is failed conservative therapy. She would be a good and hemorrhoid banding candidate. However, she is on Plavix. She came off Plavix for her corneal transplant without difficulty. If she can come off Plavix for 10 days, perhaps, we could put a couple of  bands on in one session  Recommendations:   Continue present medical regimen including once daily Zofran dosing in the morning. Will check with cardiology to see if she can safely come off Plavix for about 10 days. If this can be done, I would offer hemorrhoidal banding on about day 2 or 3 off of Plavix and keep her off Plavix for a total of 10 days -  then resuming. She is to keep her oncology followup appointment.   Further recommendations to follow.   Renal MRI's as per oncology.

## 2013-11-29 NOTE — Telephone Encounter (Signed)
Returned call and spoke w/ [name], who stated [name] [name] is in w/ a patient.  Asked if pt has been scheduled and informed she has not.  Informed Dr. Debara Pickett will be in the office on Thursday and will be notified.  Verbalized understanding.  Message forwarded to Dr. Josepha Pigg, RN.

## 2013-11-29 NOTE — Progress Notes (Signed)
I called Dr.Hilty office to see about holding her Plavix for 10 days for hemorrhoid banding. I lefted a message with his nurse. He will not be in the office until Thursday. His nurse call to inform me of this and she will ask him if we can hold the Plavix for the 10 days to do the hemorrhoid banding.

## 2013-11-29 NOTE — Patient Instructions (Signed)
Continue present medical regimen  I will check with your cardiologist about coming off plavix for hemorrhoid banding  Further recommendations to follow

## 2013-11-29 NOTE — Telephone Encounter (Signed)
Wanting to see if [name] [name] can come off of Plavix for 10 days , for Hemorid banding .Marland Kitchen Please call

## 2013-11-30 NOTE — Telephone Encounter (Signed)
Ok to come off of plavix for banding - only need to hold it for 5 days.  Dr. Debara Pickett

## 2013-12-01 NOTE — Telephone Encounter (Signed)
LM to have Ginger return call

## 2013-12-01 NOTE — Telephone Encounter (Signed)
Message forwarded to Jenna, RN.  

## 2013-12-01 NOTE — Telephone Encounter (Signed)
I will send to RMR to see what he say.

## 2013-12-01 NOTE — Telephone Encounter (Signed)
Spoke with Ginger. Informed her of advice regarding patient holding plavix for procedure. They are on EPIC and she located telephone call documentation and will inform MD. Will contact us if more info is needed.

## 2013-12-05 NOTE — Telephone Encounter (Signed)
Patient needs to be off Plavix for 10 days. 2 days before banding in about 8 days following the procedure (when risk of bleeding his highest).  Can this be done for 10 days total?

## 2013-12-06 NOTE — Telephone Encounter (Signed)
Routing to Dr. Debara Pickett for his recommendations. Please see note from Dr. Gala Romney.

## 2013-12-07 NOTE — Telephone Encounter (Signed)
Ok to hold plavix for the 10 day period specified.

## 2013-12-07 NOTE — Telephone Encounter (Signed)
Tried to call pt- LMOM 

## 2013-12-07 NOTE — Telephone Encounter (Signed)
Okay; let's hold Plavix for 2 days will be banding on the third day off Plavix and keep her off for a total of 10 days thereafter

## 2013-12-08 NOTE — Telephone Encounter (Signed)
Spoke with Clarene Reamer- pts daughter, gave her instructions on when to hold plavix. SS is going to make them an appointment. She said she understood all instructions with make sure her mother follows them.

## 2013-12-08 NOTE — Telephone Encounter (Signed)
Pt's daughter is aware of banding with RMR on 6/2 at 0930

## 2013-12-19 ENCOUNTER — Other Ambulatory Visit: Payer: Self-pay | Admitting: *Deleted

## 2013-12-19 DIAGNOSIS — M5416 Radiculopathy, lumbar region: Secondary | ICD-10-CM

## 2013-12-19 DIAGNOSIS — M48061 Spinal stenosis, lumbar region without neurogenic claudication: Secondary | ICD-10-CM

## 2013-12-19 DIAGNOSIS — M25552 Pain in left hip: Secondary | ICD-10-CM

## 2013-12-19 MED ORDER — GABAPENTIN 100 MG PO CAPS: 200.0000 mg | ORAL_CAPSULE | Freq: Three times a day (TID) | ORAL | Status: DC

## 2013-12-21 ENCOUNTER — Encounter: Payer: Self-pay | Admitting: Hematology and Oncology

## 2013-12-22 LAB — MISCELLANEOUS TEST

## 2014-01-03 ENCOUNTER — Ambulatory Visit (INDEPENDENT_AMBULATORY_CARE_PROVIDER_SITE_OTHER): Payer: Medicare Other | Admitting: Internal Medicine

## 2014-01-03 ENCOUNTER — Encounter (INDEPENDENT_AMBULATORY_CARE_PROVIDER_SITE_OTHER): Payer: Self-pay

## 2014-01-03 ENCOUNTER — Encounter: Payer: Self-pay | Admitting: Internal Medicine

## 2014-01-03 VITALS — BP 149/66 | HR 71 | Temp 97.1°F | Resp 16 | Ht 66.0 in | Wt 174.8 lb

## 2014-01-03 DIAGNOSIS — K648 Other hemorrhoids: Secondary | ICD-10-CM

## 2014-01-03 NOTE — Progress Notes (Signed)
[place] banding procedure note:  The patient presents with symptomatic grade grade 3 hemorrhoids. Has been on Plavix. Colonoscopy 2012. Has prolapsed mucosa in the rectum at that time. Takes Metamucil daily. Desires hemorrhoid banding. We received permission from the cardiologist to allow her to come of Plavix for 10 days. She stopped taking it 2 days ago which gives [place] a window to band today. I explained to her and her daughter that I may go ahead and place multiple bands on in one setting today to capitalize on our window of opportunity. Questions answers all parties agreeable.   In the left lateral decubitus position, a digital rectal exam was performed. Patient had the 2 areas of grade 3 hemorrhoids.; Otherwise, digital exam normal. Somewhat lax sphincter tone.  The decision was made to band her hemorrhoids hemorrhoids; the [place] was used to perform band ligation without complication. I elected to band the right anterior, then the right posterior and finally the left lateral hemorrhoid column. She had a little pinching after the band was deployed on the right anterior column. This was loosened before the band was actually deployed. Digital anorectal examination was then performed to assure proper positioning of the band; all 3 bands were in good position appropriately engaged without pinching her pain following the procedure.. Dietary and behavioral recommendations were given and (if necessary prescriptions were given), along with follow-up instructions. The patient will return in about 6-8 weeks for followup. required.  No complications were encountered and the patient tolerated the procedure well.

## 2014-01-03 NOTE — Patient Instructions (Signed)
Avoid straining.  Continue Metamucil once daily  Limit toilet time to 2-3 minutes  Call with any interim problems  Resume Plavix on Wednesday, June 10  Office visit with me in 8 weeks

## 2014-01-30 ENCOUNTER — Other Ambulatory Visit: Payer: Self-pay | Admitting: *Deleted

## 2014-01-30 DIAGNOSIS — M5136 Other intervertebral disc degeneration, lumbar region: Secondary | ICD-10-CM

## 2014-01-30 MED ORDER — TRAMADOL-ACETAMINOPHEN 37.5-325 MG PO TABS: 1.0000 | ORAL_TABLET | Freq: Four times a day (QID) | ORAL | Status: DC | PRN

## 2014-02-27 ENCOUNTER — Encounter: Payer: Self-pay | Admitting: Internal Medicine

## 2014-02-27 ENCOUNTER — Ambulatory Visit (INDEPENDENT_AMBULATORY_CARE_PROVIDER_SITE_OTHER): Payer: Medicare Other | Admitting: Internal Medicine

## 2014-02-27 ENCOUNTER — Encounter (INDEPENDENT_AMBULATORY_CARE_PROVIDER_SITE_OTHER): Payer: Self-pay

## 2014-02-27 VITALS — BP 136/54 | HR 66 | Temp 97.0°F | Ht 65.0 in | Wt 178.2 lb

## 2014-02-27 DIAGNOSIS — R110 Nausea: Secondary | ICD-10-CM

## 2014-02-27 DIAGNOSIS — K648 Other hemorrhoids: Secondary | ICD-10-CM | POA: Diagnosis not present

## 2014-02-27 DIAGNOSIS — K219 Gastro-esophageal reflux disease without esophagitis: Secondary | ICD-10-CM

## 2014-02-27 NOTE — Patient Instructions (Signed)
Continue Metamucil twice daily   Avoid straining  Continue Protonix daily; Continue Zofran as needed  Office visit in 1 year

## 2014-02-27 NOTE — Progress Notes (Signed)
Primary Care Physician:  [name], MD Primary Gastroenterologist:  Dr. Dudley Major  Pre-Procedure History & Physical: HPI:  [name] [name] is a 80 y.o. female here for followup from recent banding. Had all 3 hemorrhoid columns banded on Plavix recently. This has been associated with global improvement in all of her hemorrhoid symptoms. Her and her daughter are very happy with her progress. She is back on Plavix. She takes Metamucil twice daily. Protonix daily controls her GERD symptoms very well. She has infrequent intermittent nausea for which he takes Zofran with good effect.  Past Medical History  Diagnosis Date  . History of blood clots     in leg  . History of knee surgery   . Mini stroke   . Hip fracture     hip surgery 2001  . Stomach ulcer     secondary to h.pylori, s/p treatment  . Thyroid condition   . Bladder infection     h/o  . Kidney infection     h/o  . Melanoma of thigh     left  . Aneurysm, thoracic aortic   . Dehydration     HISTORY   . S/P colonoscopy March 2010    RMR: friable anal canal hemorrhoids, hyperplastic ascending polyp, adenomatous descending polyp   . Fall at home 09/10/12  . Melanoma of skin 03/14/2013  . DCIS (ductal carcinoma in situ) of breast 03/14/2013    right breast  . Iron deficiency anemia, unspecified 03/14/2013    secondary to GI blood loss  . Ascending aortic aneurysm     CT in 2012 - 4.2x4.2cm  . Aortic insufficiency   . Venous stasis     edema  . Hypertension   . Hemorrhoids     Past Surgical History  Procedure Laterality Date  . Partial hysterectomy  1976  . Leg surgery    . Breast lumpectomy  1998  . Appendectomy  1942  . Cataract extraction, bilateral    . Melanoma excision Left 08/2006    left leg excision  . Tonsillectomy    . Cholecystectomy    . Varicose vein surgery    . Esophagogastroduodenoscopy  06/05/11    small hiatal hernia; + H.PYLORI GASTRITIS, s/p 5 days of Pylera, unable to finish due to N/V  .  Colonoscopy  06/05/11    pancolonic diverticulosis/ileal reosion/abnormal anorectal junction s/p biopsy: path for small intestine and Ti was benign with non-villous atrophy, rectal biopsy with prominent prolapse changes, no acute inflammation  . Corneal transplant Bilateral 2013  . Knee surgery Right 05/2006    total right  . Total hip arthroplasty Left 2001&2004     X 2 FOR LEFT HIP  . Esophagogastroduodenoscopy (egd) with esophageal dilation N/A 06/08/2013    [id number] hiatal hernia;  otherwise normal EGD s/p dilation  . Givens capsule study N/A 06/08/2013    Procedure: GIVENS CAPSULE STUDY;  Surgeon: Daneil Dolin, MD;  Location: [facility];  Service: Endoscopy;  Laterality: N/A;  . Esophagogastroduodenoscopy N/A 06/13/2013    Dr. Oneida Alar- see enteroscopy  . Enteroscopy N/A 06/13/2013    [id number] Gastritis/GI BLEED MOST LIKELY DUE TO DUODENAL ULCERS, AND ? AVMs  . Transthoracic echocardiogram  10/06/2012    EF 55-60%, mod eccentric hypertrophy, grade 2 diastolic dysfunction; mildly calcifed AV annulus with moderate regurg; aortic root mildly dilated; LA severely dailted; PA peak pressure 66mHg  . Nm myocar perf wall motion  03/27/2010    dipyridamole; small reversible basal to  mid-septal defect (?artifact), post-stress EF 55%, low risk scan   . Hemorrhoid banding      Prior to Admission medications   Medication Sig Start Date End Date Taking? Authorizing Provider  clopidogrel (PLAVIX) 75 MG tablet Take 75 mg by mouth daily with breakfast.   Yes Historical Provider, MD  gabapentin (NEURONTIN) 100 MG capsule Take 2 capsules (200 mg total) by mouth 3 (three) times daily. 12/19/13  Yes Carole Civil, MD  Multiple Vitamins-Minerals (OPTI-VITAMINS PO) Take 1 tablet by mouth daily.   Yes Historical Provider, MD  pantoprazole (PROTONIX) 40 MG tablet Take 1 tablet (40 mg total) by mouth 2 (two) times daily. 10/04/13  Yes Orvil Feil, NP  promethazine (PHENERGAN) 25 MG tablet Take 25 mg by mouth  Every 6 hours as needed for nausea.  02/26/12  Yes Historical Provider, MD  psyllium (METAMUCIL) 58.6 % packet Take 1 packet by mouth 2 (two) times daily.   Yes Historical Provider, MD  sodium chloride (MURO 128) 2 % ophthalmic solution Place 1 drop into both eyes 3 (three) times daily.    Yes Historical Provider, MD  Tetrahydrozoline-Zn Sulfate (EYE DROPS RELIEF OP) Apply to eye 3 (three) times daily.   Yes Historical Provider, MD  traMADol-acetaminophen (ULTRACET) 37.5-325 MG per tablet Take 1 tablet by mouth every 6 (six) hours as needed. 01/30/14  Yes Carole Civil, MD  hydrocortisone (PROCTOZONE-HC) 2.5 % rectal cream Place 1 application rectally 2 (two) times daily. For 2 weeks 08/23/13   Mahala Menghini, PA-C  ondansetron (ZOFRAN) 4 MG tablet Take 1 tablet (4 mg total) by mouth every 8 (eight) hours as needed for nausea or vomiting. 09/13/13   Mahala Menghini, PA-C    Allergies as of 02/27/2014 - Review Complete 02/27/2014  Allergen Reaction Noted  . Codeine Diarrhea and Nausea Only   . Iron Nausea And Vomiting 02/01/2011  . Asa [aspirin] Rash and Other (See Comments) 07/27/2013  . Penicillins Rash     Family History  Problem Relation Age of Onset  . Colon cancer Neg Hx   . Breast cancer Daughter     also hyperlipidemia  . Breast cancer Daughter     also hyperlipidemia  . Asthma Mother   . Multiple sclerosis Child   . Hyperlipidemia Child     History   Social History  . Marital Status: Widowed    Spouse Name: N/A    Number of Children: 5  . Years of Education: 6   Occupational History  .     Social History Main Topics  . Smoking status: Former Smoker -- 1.50 packs/day for 20 years    Types: Cigarettes    Quit date: 07/05/1975  . Smokeless tobacco: Never Used     Comment: Quit smoking in 1975  . Alcohol Use: No  . Drug Use: No  . Sexual Activity: No   Other Topics Concern  . Not on file   Social History Narrative  . No narrative on file    Review of  Systems: See HPI, otherwise negative ROS  Physical Exam: BP 136/54  Pulse 66  Temp(Src) 97 F (36.1 C) (Oral)  Ht 5\' 5"  (1.651 m)  Wt 178 lb 3.2 oz (80.831 kg)  BMI 29.65 kg/m2 General:   Alert,  Well-developed, well-nourished, pleasant and cooperative in NAD Detail exam deferred   Impression:  Very pleasant 80 year old lady with GERD well-controlled on protonix. Chronic intermittent nausea well controlled on Zofran. Status post recent hemorrhoid  banding for symptomatic hemorrhoids. . Banding has worked very nicely for this lady.  Recommendations:  Continue Metamucil twice daily   Avoid straining  Continue Protonix daily; Continue Zofran as needed  Office visit in 1 year    Notice: This dictation was prepared with Dragon dictation along with smaller phrase technology. Any transcriptional errors that result from this process are unintentional and may not be corrected upon review.

## 2014-03-13 ENCOUNTER — Encounter (HOSPITAL_COMMUNITY): Payer: Self-pay | Admitting: Emergency Medicine

## 2014-03-13 ENCOUNTER — Emergency Department (HOSPITAL_COMMUNITY): Payer: Medicare Other

## 2014-03-13 ENCOUNTER — Emergency Department (HOSPITAL_COMMUNITY)
Admission: EM | Admit: 2014-03-13 | Discharge: 2014-03-13 | Disposition: A | Discharge status: Home / Self Care | Payer: Medicare Other | Attending: Emergency Medicine | Admitting: Emergency Medicine

## 2014-03-13 ENCOUNTER — Other Ambulatory Visit: Payer: Self-pay | Admitting: *Deleted

## 2014-03-13 DIAGNOSIS — W1809XA Striking against other object with subsequent fall, initial encounter: Secondary | ICD-10-CM | POA: Diagnosis not present

## 2014-03-13 DIAGNOSIS — Z8673 Personal history of transient ischemic attack (TIA), and cerebral infarction without residual deficits: Secondary | ICD-10-CM | POA: Insufficient documentation

## 2014-03-13 DIAGNOSIS — I10 Essential (primary) hypertension: Secondary | ICD-10-CM | POA: Insufficient documentation

## 2014-03-13 DIAGNOSIS — J811 Chronic pulmonary edema: Secondary | ICD-10-CM | POA: Insufficient documentation

## 2014-03-13 DIAGNOSIS — S7000XA Contusion of unspecified hip, initial encounter: Secondary | ICD-10-CM | POA: Diagnosis not present

## 2014-03-13 DIAGNOSIS — Z79899 Other long term (current) drug therapy: Secondary | ICD-10-CM | POA: Diagnosis not present

## 2014-03-13 DIAGNOSIS — S298XXA Other specified injuries of thorax, initial encounter: Secondary | ICD-10-CM | POA: Diagnosis not present

## 2014-03-13 DIAGNOSIS — Z8781 Personal history of (healed) traumatic fracture: Secondary | ICD-10-CM | POA: Insufficient documentation

## 2014-03-13 DIAGNOSIS — R42 Dizziness and giddiness: Secondary | ICD-10-CM | POA: Insufficient documentation

## 2014-03-13 DIAGNOSIS — Y9389 Activity, other specified: Secondary | ICD-10-CM | POA: Insufficient documentation

## 2014-03-13 DIAGNOSIS — Z87448 Personal history of other diseases of urinary system: Secondary | ICD-10-CM | POA: Diagnosis not present

## 2014-03-13 DIAGNOSIS — S79929A Unspecified injury of unspecified thigh, initial encounter: Secondary | ICD-10-CM | POA: Diagnosis not present

## 2014-03-13 DIAGNOSIS — Z87891 Personal history of nicotine dependence: Secondary | ICD-10-CM | POA: Insufficient documentation

## 2014-03-13 DIAGNOSIS — Z853 Personal history of malignant neoplasm of breast: Secondary | ICD-10-CM | POA: Diagnosis not present

## 2014-03-13 DIAGNOSIS — M48061 Spinal stenosis, lumbar region without neurogenic claudication: Secondary | ICD-10-CM

## 2014-03-13 DIAGNOSIS — M25559 Pain in unspecified hip: Secondary | ICD-10-CM | POA: Diagnosis not present

## 2014-03-13 DIAGNOSIS — S7001XA Contusion of right hip, initial encounter: Secondary | ICD-10-CM

## 2014-03-13 DIAGNOSIS — Y92009 Unspecified place in unspecified non-institutional (private) residence as the place of occurrence of the external cause: Secondary | ICD-10-CM | POA: Insufficient documentation

## 2014-03-13 DIAGNOSIS — S79919A Unspecified injury of unspecified hip, initial encounter: Secondary | ICD-10-CM | POA: Diagnosis not present

## 2014-03-13 DIAGNOSIS — R6889 Other general symptoms and signs: Secondary | ICD-10-CM | POA: Diagnosis not present

## 2014-03-13 DIAGNOSIS — Z85820 Personal history of malignant melanoma of skin: Secondary | ICD-10-CM | POA: Insufficient documentation

## 2014-03-13 DIAGNOSIS — R0989 Other specified symptoms and signs involving the circulatory and respiratory systems: Secondary | ICD-10-CM | POA: Diagnosis not present

## 2014-03-13 DIAGNOSIS — M25552 Pain in left hip: Secondary | ICD-10-CM

## 2014-03-13 DIAGNOSIS — W19XXXA Unspecified fall, initial encounter: Secondary | ICD-10-CM

## 2014-03-13 DIAGNOSIS — Z880 Allergy status to penicillin: Secondary | ICD-10-CM | POA: Diagnosis not present

## 2014-03-13 DIAGNOSIS — Z7902 Long term (current) use of antithrombotics/antiplatelets: Secondary | ICD-10-CM | POA: Diagnosis not present

## 2014-03-13 DIAGNOSIS — Z8719 Personal history of other diseases of the digestive system: Secondary | ICD-10-CM | POA: Diagnosis not present

## 2014-03-13 DIAGNOSIS — M5416 Radiculopathy, lumbar region: Secondary | ICD-10-CM

## 2014-03-13 LAB — CBC WITH DIFFERENTIAL/PLATELET
BASOS ABS: 0 10*3/uL (ref 0.0–0.1)
BASOS PCT: 0 % (ref 0–1)
Eosinophils Absolute: 0.1 10*3/uL (ref 0.0–0.7)
Eosinophils Relative: 2 % (ref 0–5)
HCT: 38.4 % (ref 36.0–46.0)
Hemoglobin: 12.5 g/dL (ref 12.0–15.0)
Lymphocytes Relative: 34 % (ref 12–46)
Lymphs Abs: 1.4 10*3/uL (ref 0.7–4.0)
MCH: 30.5 pg (ref 26.0–34.0)
MCHC: 32.6 g/dL (ref 30.0–36.0)
MCV: 93.7 fL (ref 78.0–100.0)
MONO ABS: 0.2 10*3/uL (ref 0.1–1.0)
Monocytes Relative: 6 % (ref 3–12)
NEUTROS ABS: 2.4 10*3/uL (ref 1.7–7.7)
Neutrophils Relative %: 58 % (ref 43–77)
PLATELETS: 145 10*3/uL — AB (ref 150–400)
RBC: 4.1 MIL/uL (ref 3.87–5.11)
RDW: 13.6 % (ref 11.5–15.5)
WBC: 4 10*3/uL (ref 4.0–10.5)

## 2014-03-13 LAB — BASIC METABOLIC PANEL
ANION GAP: 11 (ref 5–15)
BUN: 13 mg/dL (ref 6–23)
CHLORIDE: 105 meq/L (ref 96–112)
CO2: 29 mEq/L (ref 19–32)
Calcium: 8.6 mg/dL (ref 8.4–10.5)
Creatinine, Ser: 1.03 mg/dL (ref 0.50–1.10)
GFR calc non Af Amer: 47 mL/min — ABNORMAL LOW (ref >90)
GFR, EST AFRICAN AMERICAN: 54 mL/min — AB (ref >90)
Glucose, Bld: 107 mg/dL — ABNORMAL HIGH (ref 70–99)
Potassium: 3.5 mEq/L — ABNORMAL LOW (ref 3.7–5.3)
Sodium: 145 mEq/L (ref 137–147)

## 2014-03-13 MED ORDER — FUROSEMIDE 20 MG PO TABS: 20.0000 mg | ORAL_TABLET | Freq: Every day | ORAL | Status: DC

## 2014-03-13 MED ORDER — GABAPENTIN 100 MG PO CAPS: 200.0000 mg | ORAL_CAPSULE | Freq: Three times a day (TID) | ORAL | Status: DC

## 2014-03-13 MED ORDER — FUROSEMIDE 40 MG PO TABS
40.0000 mg | ORAL_TABLET | Freq: Once | ORAL | Status: AC
  Administered 2014-03-13: 40 mg via ORAL
  Filled 2014-03-13: qty 1

## 2014-03-13 NOTE — ED Notes (Signed)
Pt placed on 02 2l [place] due to sats around 89-91% RA.

## 2014-03-13 NOTE — ED Notes (Signed)
Pt placed back on 02 2l [place]. Advised EDP in emergency and will be in asap to update on xray. Nad. No resp distress noted.

## 2014-03-13 NOTE — ED Notes (Signed)
Pt returned from X-ray.  

## 2014-03-13 NOTE — ED Notes (Signed)
Pt states turned away from sink and became dizzy. Fell and hit right arm to wall and then the floor. States started around 330pm. Pt c/o "little" dizziness now. Denies LOC. C/o right leg when she moves. Mild skin tear with bleeding controlled to posterior left hand. Alert/oriented.

## 2014-03-13 NOTE — ED Notes (Signed)
Pt arrived via EMS after falling at home. She hit her head and injured her left hand. Abrasion evident on her left hand. She did not lose consciousness and was able to scoot to her chair lift and call for help. Pt lives alone. Pt feels dizzy and lightheaded. Pt had nausea shortly after fall but denies nausea att. NAD att.

## 2014-03-13 NOTE — Discharge Instructions (Signed)
X-ray of right hip was normal. You may have a small amount of fluid in your lungs. Start Lasix 20 mg daily. Expect to urinate more.  Prescription given.  Follow up your dr this week

## 2014-03-13 NOTE — ED Notes (Signed)
Family at bedside. Patient went to restroom and returned to room stated she felt short winded. Placed back on O2.

## 2014-03-13 NOTE — ED Notes (Addendum)
Pt taken off 02. Pt sats down to 89-90% with good pleth. Lung sounds dimished throughout with adventitious breath sounds to LLL. EDP aware and vo to get chest xray 2 view, read back and verified. Pt then stated she has been coughing with intermittent sob x 1 week. edp aware

## 2014-03-14 ENCOUNTER — Other Ambulatory Visit (HOSPITAL_COMMUNITY): Payer: Medicare Other

## 2014-03-14 ENCOUNTER — Ambulatory Visit (HOSPITAL_COMMUNITY): Payer: Medicare Other

## 2014-03-14 NOTE — ED Provider Notes (Signed)
CSN: [id number]     Arrival date & time 03/13/14  1622 History   First MD Initiated Contact with Patient 03/13/14 1635     Chief Complaint  Patient presents with  . Fall  . Dizziness     (Consider location/radiation/quality/duration/timing/severity/associated sxs/prior Treatment) HPI... patient was at the sink doing her dishes when she turned around, felt dizzy and fell this afternoon.   This has happened before.  She complains of pain to the right posterior lateral hip area. She is alert and oriented. Moving all 4 extremities equally. Complains of mild persistent dyspnea.  No chest pain, fever, chills, cough, dysuria.  Family reports normal behavior  Past Medical History  Diagnosis Date  . History of blood clots     in leg  . History of knee surgery   . Mini stroke   . Hip fracture     hip surgery 2001  . Stomach ulcer     secondary to h.pylori, s/p treatment  . Thyroid condition   . Bladder infection     h/o  . Kidney infection     h/o  . Melanoma of thigh     left  . Aneurysm, thoracic aortic   . Dehydration     HISTORY   . S/P colonoscopy March 2010    RMR: friable anal canal hemorrhoids, hyperplastic ascending polyp, adenomatous descending polyp   . Fall at home 09/10/12  . Melanoma of skin 03/14/2013  . DCIS (ductal carcinoma in situ) of breast 03/14/2013    right breast  . Iron deficiency anemia, unspecified 03/14/2013    secondary to GI blood loss  . Ascending aortic aneurysm     CT in 2012 - 4.2x4.2cm  . Aortic insufficiency   . Venous stasis     edema  . Hypertension   . Hemorrhoids    Past Surgical History  Procedure Laterality Date  . Partial hysterectomy  1976  . Leg surgery    . Breast lumpectomy  1998  . Appendectomy  1942  . Cataract extraction, bilateral    . Melanoma excision Left 08/2006    left leg excision  . Tonsillectomy    . Cholecystectomy    . Varicose vein surgery    . Esophagogastroduodenoscopy  06/05/11    small hiatal hernia; +  H.PYLORI GASTRITIS, s/p 5 days of Pylera, unable to finish due to N/V  . Colonoscopy  06/05/11    pancolonic diverticulosis/ileal reosion/abnormal anorectal junction s/p biopsy: path for small intestine and Ti was benign with non-villous atrophy, rectal biopsy with prominent prolapse changes, no acute inflammation  . Corneal transplant Bilateral 2013  . Knee surgery Right 05/2006    total right  . Total hip arthroplasty Left 2001&2004     X 2 FOR LEFT HIP  . Esophagogastroduodenoscopy (egd) with esophageal dilation N/A 06/08/2013    [id number] hiatal hernia;  otherwise normal EGD s/p dilation  . Givens capsule study N/A 06/08/2013    Procedure: GIVENS CAPSULE STUDY;  Surgeon: Daneil Dolin, MD;  Location: [facility];  Service: Endoscopy;  Laterality: N/A;  . Esophagogastroduodenoscopy N/A 06/13/2013    Dr. Oneida Alar- see enteroscopy  . Enteroscopy N/A 06/13/2013    [id number] Gastritis/GI BLEED MOST LIKELY DUE TO DUODENAL ULCERS, AND ? AVMs  . Transthoracic echocardiogram  10/06/2012    EF 55-60%, mod eccentric hypertrophy, grade 2 diastolic dysfunction; mildly calcifed AV annulus with moderate regurg; aortic root mildly dilated; LA severely dailted; PA peak pressure 73mHg  . Nm  myocar perf wall motion  03/27/2010    dipyridamole; small reversible basal to mid-septal defect (?artifact), post-stress EF 55%, low risk scan   . Hemorrhoid banding     Family History  Problem Relation Age of Onset  . Colon cancer Neg Hx   . Breast cancer Daughter     also hyperlipidemia  . Breast cancer Daughter     also hyperlipidemia  . Asthma Mother   . Multiple sclerosis Child   . Hyperlipidemia Child    History  Substance Use Topics  . Smoking status: Former Smoker -- 1.50 packs/day for 20 years    Types: Cigarettes    Quit date: 07/05/1975  . Smokeless tobacco: Never Used     Comment: Quit smoking in 1975  . Alcohol Use: No   OB History   Grav Para Term Preterm Abortions TAB SAB Ect Mult Living                  Review of Systems  All other systems reviewed and are negative.     Allergies  Codeine; Iron; Asa; and Penicillins  Home Medications   Prior to Admission medications   Medication Sig Start Date End Date Taking? Authorizing Provider  clopidogrel (PLAVIX) 75 MG tablet Take 75 mg by mouth daily with breakfast.   Yes Historical Provider, MD  gabapentin (NEURONTIN) 100 MG capsule Take 2 capsules (200 mg total) by mouth 3 (three) times daily. 03/13/14  Yes Carole Civil, MD  Multiple Vitamins-Minerals (OPTI-VITAMINS PO) Take 1 tablet by mouth daily.   Yes Historical Provider, MD  ondansetron (ZOFRAN) 4 MG tablet Take 1 tablet (4 mg total) by mouth every 8 (eight) hours as needed for nausea or vomiting. 09/13/13  Yes Mahala Menghini, PA-C  pantoprazole (PROTONIX) 40 MG tablet Take 1 tablet (40 mg total) by mouth 2 (two) times daily. 10/04/13  Yes Orvil Feil, NP  prednisoLONE acetate (PRED FORTE) 1 % ophthalmic suspension Place 1 drop into both eyes 3 (three) times daily. 02/11/14  Yes Historical Provider, MD  psyllium (METAMUCIL) 58.6 % packet Take 1 packet by mouth 2 (two) times daily.   Yes Historical Provider, MD  sodium chloride (MURO 128) 2 % ophthalmic solution Place 1 drop into both eyes 3 (three) times daily.    Yes Historical Provider, MD  traMADol-acetaminophen (ULTRACET) 37.5-325 MG per tablet Take 1 tablet by mouth every 6 (six) hours as needed. 01/30/14  Yes Carole Civil, MD  furosemide (LASIX) 20 MG tablet Take 1 tablet (20 mg total) by mouth daily. 03/13/14   Nat Christen, MD   BP 172/105  Pulse 65  Temp(Src) 98.4 F (36.9 C)  Resp 20  Ht 5\' 6"  (1.676 m)  Wt 180 lb (81.647 kg)  BMI 29.07 kg/m2  SpO2 96% Physical Exam  Nursing note and vitals reviewed. Constitutional: She is oriented to person, place, and time. She appears well-developed and well-nourished.  HENT:  Head: Normocephalic and atraumatic.  Eyes: Conjunctivae and EOM are normal. Pupils are  equal, round, and reactive to light.  Neck: Normal range of motion. Neck supple.  Cardiovascular: Normal rate, regular rhythm and normal heart sounds.   Pulmonary/Chest: Effort normal and breath sounds normal.  No rales  Abdominal: Soft. Bowel sounds are normal.  Musculoskeletal:  Tender right posterior lateral hip  Neurological: She is alert and oriented to person, place, and time.  Skin: Skin is warm and dry.  Psychiatric: She has a normal mood and affect. Her  behavior is normal.    ED Course  Procedures (including critical care time) Labs Review Labs Reviewed  CBC WITH DIFFERENTIAL - Abnormal; Notable for the following:    Platelets 145 (*)    All other components within normal limits  BASIC METABOLIC PANEL - Abnormal; Notable for the following:    Potassium 3.5 (*)    Glucose, Bld 107 (*)    GFR calc non Af Amer 47 (*)    GFR calc Af Amer 54 (*)    All other components within normal limits    Imaging Review Dg Chest 2 View  03/13/2014   CLINICAL DATA:  80 year old female fall and chest injury.  EXAM: CHEST  2 VIEW  COMPARISON:  02/07/2013 and prior chest radiographs dating back to 08/25/2007  FINDINGS: Cardiomegaly and mild pulmonary vascular congestion identified.  Mild interstitial opacities may represent mild interstitial edema.  There is no evidence of pleural effusion, pneumothorax or definite acute bony abnormality. Diffuse osteopenia and remote right-sided rib fractures are identified.  IMPRESSION: Cardiomegaly with pulmonary vascular congestion and possible mild interstitial pulmonary edema.   Electronically Signed   By: Hassan Rowan M.D.   On: 03/13/2014 18:24   Dg Hip Complete Right  03/13/2014   CLINICAL DATA:  Fall, dizziness, RIGHT leg pain  EXAM: RIGHT HIP - COMPLETE 2+ VIEW  COMPARISON:  02/24/2004  FINDINGS: Osseous demineralization.  LEFT hip prosthesis.  Degenerative changes RIGHT hip joint.  SI joints grossly symmetric.  Degenerative disc and facet disease changes  lower lumbar spine.  No acute fracture, dislocation, or bone destruction.  IMPRESSION: No acute osseous abnormalities.   Electronically Signed   By: Lavonia Dana M.D.   On: 03/13/2014 17:38     EKG Interpretation   Date/Time:  Monday March 13 2014 16:40:10 EDT Ventricular Rate:  66 PR Interval:  172 QRS Duration: 158 QT Interval:  509 QTC Calculation: 533 R Axis:   -21 Text Interpretation:  Sinus rhythm Right bundle branch block Confirmed by  Altair Appenzeller  MD, Keelie Zemanek ([id number]) on 03/13/2014 5:29:10 PM      MDM   Final diagnoses:  Fall, initial encounter  Contusion of right hip, initial encounter  Pulmonary vascular congestion    Plain films of right hip negative for fracture. Chest x-ray suggests some pulmonary vascular congestion. Will start low-dose Lasix. Patient has primary care followup.  Discussed all findings with patient and her 2 daughters.  She will get primary care followup this week.    Nat Christen, MD 03/14/14 1135

## 2014-03-16 DIAGNOSIS — Z6833 Body mass index (BMI) 33.0-33.9, adult: Secondary | ICD-10-CM | POA: Diagnosis not present

## 2014-03-16 DIAGNOSIS — I959 Hypotension, unspecified: Secondary | ICD-10-CM | POA: Diagnosis not present

## 2014-03-16 DIAGNOSIS — R5381 Other malaise: Secondary | ICD-10-CM | POA: Diagnosis not present

## 2014-03-16 DIAGNOSIS — N390 Urinary tract infection, site not specified: Secondary | ICD-10-CM | POA: Diagnosis not present

## 2014-03-16 DIAGNOSIS — R5383 Other fatigue: Secondary | ICD-10-CM | POA: Diagnosis not present

## 2014-03-21 DIAGNOSIS — R32 Unspecified urinary incontinence: Secondary | ICD-10-CM | POA: Diagnosis not present

## 2014-03-21 DIAGNOSIS — M1990 Unspecified osteoarthritis, unspecified site: Secondary | ICD-10-CM | POA: Diagnosis not present

## 2014-03-21 DIAGNOSIS — M545 Low back pain: Secondary | ICD-10-CM | POA: Diagnosis not present

## 2014-03-21 DIAGNOSIS — N390 Urinary tract infection, site not specified: Secondary | ICD-10-CM | POA: Diagnosis not present

## 2014-03-21 DIAGNOSIS — Z9181 History of falling: Secondary | ICD-10-CM | POA: Diagnosis not present

## 2014-03-21 DIAGNOSIS — R55 Syncope and collapse: Secondary | ICD-10-CM | POA: Diagnosis not present

## 2014-03-21 DIAGNOSIS — I10 Essential (primary) hypertension: Secondary | ICD-10-CM | POA: Diagnosis not present

## 2014-03-21 DIAGNOSIS — M6281 Muscle weakness (generalized): Secondary | ICD-10-CM | POA: Diagnosis not present

## 2014-03-22 ENCOUNTER — Ambulatory Visit (HOSPITAL_COMMUNITY): Payer: Medicare Other

## 2014-03-22 ENCOUNTER — Other Ambulatory Visit (HOSPITAL_COMMUNITY): Payer: Medicare Other

## 2014-03-22 DIAGNOSIS — M6281 Muscle weakness (generalized): Secondary | ICD-10-CM | POA: Diagnosis not present

## 2014-03-22 DIAGNOSIS — R55 Syncope and collapse: Secondary | ICD-10-CM | POA: Diagnosis not present

## 2014-03-22 DIAGNOSIS — I10 Essential (primary) hypertension: Secondary | ICD-10-CM | POA: Diagnosis not present

## 2014-03-22 DIAGNOSIS — M545 Low back pain: Secondary | ICD-10-CM | POA: Diagnosis not present

## 2014-03-22 DIAGNOSIS — N390 Urinary tract infection, site not specified: Secondary | ICD-10-CM | POA: Diagnosis not present

## 2014-03-22 DIAGNOSIS — M1990 Unspecified osteoarthritis, unspecified site: Secondary | ICD-10-CM | POA: Diagnosis not present

## 2014-03-23 DIAGNOSIS — N390 Urinary tract infection, site not specified: Secondary | ICD-10-CM | POA: Diagnosis not present

## 2014-03-23 DIAGNOSIS — M6281 Muscle weakness (generalized): Secondary | ICD-10-CM | POA: Diagnosis not present

## 2014-03-23 DIAGNOSIS — M545 Low back pain: Secondary | ICD-10-CM | POA: Diagnosis not present

## 2014-03-23 DIAGNOSIS — M1990 Unspecified osteoarthritis, unspecified site: Secondary | ICD-10-CM | POA: Diagnosis not present

## 2014-03-23 DIAGNOSIS — R55 Syncope and collapse: Secondary | ICD-10-CM | POA: Diagnosis not present

## 2014-03-23 DIAGNOSIS — I10 Essential (primary) hypertension: Secondary | ICD-10-CM | POA: Diagnosis not present

## 2014-03-23 NOTE — Progress Notes (Signed)
This encounter was created in error - please disregard.

## 2014-03-27 DIAGNOSIS — M545 Low back pain: Secondary | ICD-10-CM | POA: Diagnosis not present

## 2014-03-27 DIAGNOSIS — M1990 Unspecified osteoarthritis, unspecified site: Secondary | ICD-10-CM | POA: Diagnosis not present

## 2014-03-27 DIAGNOSIS — N390 Urinary tract infection, site not specified: Secondary | ICD-10-CM | POA: Diagnosis not present

## 2014-03-27 DIAGNOSIS — R55 Syncope and collapse: Secondary | ICD-10-CM | POA: Diagnosis not present

## 2014-03-27 DIAGNOSIS — M6281 Muscle weakness (generalized): Secondary | ICD-10-CM | POA: Diagnosis not present

## 2014-03-27 DIAGNOSIS — I10 Essential (primary) hypertension: Secondary | ICD-10-CM | POA: Diagnosis not present

## 2014-03-28 DIAGNOSIS — I10 Essential (primary) hypertension: Secondary | ICD-10-CM | POA: Diagnosis not present

## 2014-03-28 DIAGNOSIS — R55 Syncope and collapse: Secondary | ICD-10-CM | POA: Diagnosis not present

## 2014-03-28 DIAGNOSIS — M1990 Unspecified osteoarthritis, unspecified site: Secondary | ICD-10-CM | POA: Diagnosis not present

## 2014-03-28 DIAGNOSIS — M545 Low back pain: Secondary | ICD-10-CM | POA: Diagnosis not present

## 2014-03-28 DIAGNOSIS — N390 Urinary tract infection, site not specified: Secondary | ICD-10-CM | POA: Diagnosis not present

## 2014-03-28 DIAGNOSIS — M6281 Muscle weakness (generalized): Secondary | ICD-10-CM | POA: Diagnosis not present

## 2014-03-30 DIAGNOSIS — M1990 Unspecified osteoarthritis, unspecified site: Secondary | ICD-10-CM | POA: Diagnosis not present

## 2014-03-30 DIAGNOSIS — M6281 Muscle weakness (generalized): Secondary | ICD-10-CM | POA: Diagnosis not present

## 2014-03-30 DIAGNOSIS — R55 Syncope and collapse: Secondary | ICD-10-CM | POA: Diagnosis not present

## 2014-03-30 DIAGNOSIS — I10 Essential (primary) hypertension: Secondary | ICD-10-CM | POA: Diagnosis not present

## 2014-03-30 DIAGNOSIS — N390 Urinary tract infection, site not specified: Secondary | ICD-10-CM | POA: Diagnosis not present

## 2014-03-30 DIAGNOSIS — M545 Low back pain: Secondary | ICD-10-CM | POA: Diagnosis not present

## 2014-04-03 DIAGNOSIS — M545 Low back pain: Secondary | ICD-10-CM | POA: Diagnosis not present

## 2014-04-03 DIAGNOSIS — M6281 Muscle weakness (generalized): Secondary | ICD-10-CM | POA: Diagnosis not present

## 2014-04-03 DIAGNOSIS — M1990 Unspecified osteoarthritis, unspecified site: Secondary | ICD-10-CM | POA: Diagnosis not present

## 2014-04-03 DIAGNOSIS — N390 Urinary tract infection, site not specified: Secondary | ICD-10-CM | POA: Diagnosis not present

## 2014-04-03 DIAGNOSIS — R55 Syncope and collapse: Secondary | ICD-10-CM | POA: Diagnosis not present

## 2014-04-03 DIAGNOSIS — I10 Essential (primary) hypertension: Secondary | ICD-10-CM | POA: Diagnosis not present

## 2014-04-04 DIAGNOSIS — M1990 Unspecified osteoarthritis, unspecified site: Secondary | ICD-10-CM | POA: Diagnosis not present

## 2014-04-04 DIAGNOSIS — M6281 Muscle weakness (generalized): Secondary | ICD-10-CM | POA: Diagnosis not present

## 2014-04-04 DIAGNOSIS — R55 Syncope and collapse: Secondary | ICD-10-CM | POA: Diagnosis not present

## 2014-04-04 DIAGNOSIS — M545 Low back pain: Secondary | ICD-10-CM | POA: Diagnosis not present

## 2014-04-04 DIAGNOSIS — I10 Essential (primary) hypertension: Secondary | ICD-10-CM | POA: Diagnosis not present

## 2014-04-04 DIAGNOSIS — N390 Urinary tract infection, site not specified: Secondary | ICD-10-CM | POA: Diagnosis not present

## 2014-04-05 ENCOUNTER — Encounter (HOSPITAL_COMMUNITY): Payer: Self-pay | Admitting: Emergency Medicine

## 2014-04-05 ENCOUNTER — Emergency Department (HOSPITAL_COMMUNITY)
Admission: EM | Admit: 2014-04-05 | Discharge: 2014-04-05 | Disposition: A | Discharge status: Home / Self Care | Payer: Medicare Other | Attending: Emergency Medicine | Admitting: Emergency Medicine

## 2014-04-05 DIAGNOSIS — K649 Unspecified hemorrhoids: Secondary | ICD-10-CM | POA: Diagnosis not present

## 2014-04-05 DIAGNOSIS — W19XXXA Unspecified fall, initial encounter: Secondary | ICD-10-CM | POA: Diagnosis not present

## 2014-04-05 DIAGNOSIS — Z862 Personal history of diseases of the blood and blood-forming organs and certain disorders involving the immune mechanism: Secondary | ICD-10-CM | POA: Insufficient documentation

## 2014-04-05 DIAGNOSIS — Z8719 Personal history of other diseases of the digestive system: Secondary | ICD-10-CM | POA: Diagnosis not present

## 2014-04-05 DIAGNOSIS — Z85820 Personal history of malignant melanoma of skin: Secondary | ICD-10-CM | POA: Diagnosis not present

## 2014-04-05 DIAGNOSIS — Z87891 Personal history of nicotine dependence: Secondary | ICD-10-CM | POA: Insufficient documentation

## 2014-04-05 DIAGNOSIS — IMO0002 Reserved for concepts with insufficient information to code with codable children: Secondary | ICD-10-CM | POA: Insufficient documentation

## 2014-04-05 DIAGNOSIS — Z880 Allergy status to penicillin: Secondary | ICD-10-CM | POA: Diagnosis not present

## 2014-04-05 DIAGNOSIS — R300 Dysuria: Secondary | ICD-10-CM | POA: Insufficient documentation

## 2014-04-05 DIAGNOSIS — Z79899 Other long term (current) drug therapy: Secondary | ICD-10-CM | POA: Insufficient documentation

## 2014-04-05 DIAGNOSIS — Z8639 Personal history of other endocrine, nutritional and metabolic disease: Secondary | ICD-10-CM | POA: Diagnosis not present

## 2014-04-05 DIAGNOSIS — Z7902 Long term (current) use of antithrombotics/antiplatelets: Secondary | ICD-10-CM | POA: Diagnosis not present

## 2014-04-05 DIAGNOSIS — Z8673 Personal history of transient ischemic attack (TIA), and cerebral infarction without residual deficits: Secondary | ICD-10-CM | POA: Insufficient documentation

## 2014-04-05 DIAGNOSIS — Y92009 Unspecified place in unspecified non-institutional (private) residence as the place of occurrence of the external cause: Secondary | ICD-10-CM | POA: Insufficient documentation

## 2014-04-05 DIAGNOSIS — I10 Essential (primary) hypertension: Secondary | ICD-10-CM | POA: Diagnosis not present

## 2014-04-05 DIAGNOSIS — Z86718 Personal history of other venous thrombosis and embolism: Secondary | ICD-10-CM | POA: Diagnosis not present

## 2014-04-05 DIAGNOSIS — K644 Residual hemorrhoidal skin tags: Secondary | ICD-10-CM | POA: Diagnosis not present

## 2014-04-05 DIAGNOSIS — Z8781 Personal history of (healed) traumatic fracture: Secondary | ICD-10-CM | POA: Diagnosis not present

## 2014-04-05 DIAGNOSIS — N3000 Acute cystitis without hematuria: Secondary | ICD-10-CM | POA: Insufficient documentation

## 2014-04-05 DIAGNOSIS — Y939 Activity, unspecified: Secondary | ICD-10-CM | POA: Insufficient documentation

## 2014-04-05 DIAGNOSIS — S30810A Abrasion of lower back and pelvis, initial encounter: Secondary | ICD-10-CM

## 2014-04-05 LAB — URINALYSIS, ROUTINE W REFLEX MICROSCOPIC
Bilirubin Urine: NEGATIVE
Glucose, UA: NEGATIVE mg/dL
KETONES UR: NEGATIVE mg/dL
NITRITE: NEGATIVE
Protein, ur: 30 mg/dL — AB
UROBILINOGEN UA: 1 mg/dL (ref 0.0–1.0)
pH: 6 (ref 5.0–8.0)

## 2014-04-05 LAB — URINE MICROSCOPIC-ADD ON

## 2014-04-05 MED ORDER — CIPROFLOXACIN HCL 500 MG PO TABS: 500.0000 mg | ORAL_TABLET | Freq: Two times a day (BID) | ORAL | Status: DC

## 2014-04-05 MED ORDER — HYDROCORTISONE 2.5 % RE CREA: TOPICAL_CREAM | RECTAL | Status: DC

## 2014-04-05 NOTE — Discharge Instructions (Signed)
Cipro as prescribed.  Apply hydrocortisone cream twice daily. Also apply Preparation H several times daily.  Return to the emergency department for high fever, severe abdominal pain, vomiting, or other new and concerning symptoms.   Urinary Tract Infection Urinary tract infections (UTIs) can develop anywhere along your urinary tract. Your urinary tract is your body's drainage system for removing wastes and extra water. Your urinary tract includes two kidneys, two ureters, a bladder, and a urethra. Your kidneys are a pair of bean-shaped organs. Each kidney is about the size of your fist. They are located below your ribs, one on each side of your spine. CAUSES Infections are caused by microbes, which are microscopic organisms, including fungi, viruses, and bacteria. These organisms are so small that they can only be seen through a microscope. Bacteria are the microbes that most commonly cause UTIs. SYMPTOMS  Symptoms of UTIs may vary by age and gender of the patient and by the location of the infection. Symptoms in young women typically include a frequent and intense urge to urinate and a painful, burning feeling in the bladder or urethra during urination. Older women and men are more likely to be tired, shaky, and weak and have muscle aches and abdominal pain. A fever may mean the infection is in your kidneys. Other symptoms of a kidney infection include pain in your back or sides below the ribs, nausea, and vomiting. DIAGNOSIS To diagnose a UTI, your caregiver will ask you about your symptoms. Your caregiver also will ask to provide a urine sample. The urine sample will be tested for bacteria and white blood cells. White blood cells are made by your body to help fight infection. TREATMENT  Typically, UTIs can be treated with medication. Because most UTIs are caused by a bacterial infection, they usually can be treated with the use of antibiotics. The choice of antibiotic and length of treatment  depend on your symptoms and the type of bacteria causing your infection. HOME CARE INSTRUCTIONS  If you were prescribed antibiotics, take them exactly as your caregiver instructs you. Finish the medication even if you feel better after you have only taken some of the medication.  Drink enough water and fluids to keep your urine clear or pale yellow.  Avoid caffeine, tea, and carbonated beverages. They tend to irritate your bladder.  Empty your bladder often. Avoid holding urine for long periods of time.  Empty your bladder before and after sexual intercourse.  After a bowel movement, women should cleanse from front to back. Use each tissue only once. SEEK MEDICAL CARE IF:   You have back pain.  You develop a fever.  Your symptoms do not begin to resolve within 3 days. SEEK IMMEDIATE MEDICAL CARE IF:   You have severe back pain or lower abdominal pain.  You develop chills.  You have nausea or vomiting.  You have continued burning or discomfort with urination. MAKE SURE YOU:   Understand these instructions.  Will watch your condition.  Will get help right away if you are not doing well or get worse. Document Released: 04/30/2005 Document Revised: 01/20/2012 Document Reviewed: 08/29/2011 [facility] Patient Information 2015 [place], [place]. This information is not intended to replace advice given to you by your health care provider. Make sure you discuss any questions you have with your health care provider.  Hemorrhoids Hemorrhoids are swollen veins around the rectum or anus. There are two types of hemorrhoids:   Internal hemorrhoids. These occur in the veins just inside the rectum.  They may poke through to the outside and become irritated and painful.  External hemorrhoids. These occur in the veins outside the anus and can be felt as a painful swelling or hard lump near the anus. CAUSES  Pregnancy.   Obesity.   Constipation or diarrhea.   Straining to have a bowel  movement.   Sitting for long periods on the toilet.  Heavy lifting or other activity that caused you to strain.  Anal intercourse. SYMPTOMS   Pain.   Anal itching or irritation.   Rectal bleeding.   Fecal leakage.   Anal swelling.   One or more lumps around the anus.  DIAGNOSIS  Your caregiver may be able to diagnose hemorrhoids by visual examination. Other examinations or tests that may be performed include:   Examination of the rectal area with a gloved hand (digital rectal exam).   Examination of anal canal using a small tube (scope).   A blood test if you have lost a significant amount of blood.  A test to look inside the colon (sigmoidoscopy or colonoscopy). TREATMENT Most hemorrhoids can be treated at home. However, if symptoms do not seem to be getting better or if you have a lot of rectal bleeding, your caregiver may perform a procedure to help make the hemorrhoids get smaller or remove them completely. Possible treatments include:   Placing a rubber band at the base of the hemorrhoid to cut off the circulation (rubber band ligation).   Injecting a chemical to shrink the hemorrhoid (sclerotherapy).   Using a tool to burn the hemorrhoid (infrared light therapy).   Surgically removing the hemorrhoid (hemorrhoidectomy).   Stapling the hemorrhoid to block blood flow to the tissue (hemorrhoid stapling).  HOME CARE INSTRUCTIONS   Eat foods with fiber, such as whole grains, beans, nuts, fruits, and vegetables. Ask your doctor about taking products with added fiber in them (fibersupplements).  Increase fluid intake. Drink enough water and fluids to keep your urine clear or pale yellow.   Exercise regularly.   Go to the bathroom when you have the urge to have a bowel movement. Do not wait.   Avoid straining to have bowel movements.   Keep the anal area dry and clean. Use wet toilet paper or moist towelettes after a bowel movement.   Medicated  creams and suppositories may be used or applied as directed.   Only take over-the-counter or prescription medicines as directed by your caregiver.   Take warm sitz baths for 15-20 minutes, 3-4 times a day to ease pain and discomfort.   Place ice packs on the hemorrhoids if they are tender and swollen. Using ice packs between sitz baths may be helpful.   Put ice in a plastic bag.   Place a towel between your skin and the bag.   Leave the ice on for 15-20 minutes, 3-4 times a day.   Do not use a donut-shaped pillow or sit on the toilet for long periods. This increases blood pooling and pain.  SEEK MEDICAL CARE IF:  You have increasing pain and swelling that is not controlled by treatment or medicine.  You have uncontrolled bleeding.  You have difficulty or you are unable to have a bowel movement.  You have pain or inflammation outside the area of the hemorrhoids. MAKE SURE YOU:  Understand these instructions.  Will watch your condition.  Will get help right away if you are not doing well or get worse. Document Released: 07/18/2000 Document Revised: 07/07/2012 Document Reviewed:  05/25/2012 [employer] Patient Information 2015 [place], [place]. This information is not intended to replace advice given to you by your health care provider. Make sure you discuss any questions you have with your health care provider.

## 2014-04-05 NOTE — ED Notes (Signed)
Pt alert & oriented x4, stable gait. Patient  given discharge instructions, paperwork & prescription(s). Patient instructed to stop at the registration desk to finish any additional paperwork. Patient verbalized understanding. Pt left department via wheelchair w/ no further questions.

## 2014-04-05 NOTE — ED Provider Notes (Signed)
CSN: [id number]     Arrival date & time 04/05/14  [id number] History   First MD Initiated Contact with Patient 04/05/14 0530     Chief Complaint  Patient presents with  . Abscess  . Dysuria     (Consider location/radiation/quality/duration/timing/severity/associated sxs/prior Treatment) HPI Comments: Patient is an 80 year old female with a history of aortic aneurysm, DVT. She presents for complaints of sore on her buttock. This is been going on for the past several weeks and she fell at home. She is also complaining of some dysuria and is concerned she may have a urinary tract infection.  Patient is a 80 y.o. female presenting with abscess and dysuria. The history is provided by the patient.  Abscess Abscess location: Buttock. Abscess quality: induration, painful and redness   Abscess quality: not draining   Duration:  2 weeks Progression:  Worsening Pain details:    Severity:  Moderate   Timing:  Constant   Progression:  Worsening Dysuria   Past Medical History  Diagnosis Date  . History of blood clots     in leg  . History of knee surgery   . Mini stroke   . Hip fracture     hip surgery 2001  . Stomach ulcer     secondary to h.pylori, s/p treatment  . Thyroid condition   . Bladder infection     h/o  . Kidney infection     h/o  . Melanoma of thigh     left  . Aneurysm, thoracic aortic   . Dehydration     HISTORY   . S/P colonoscopy March 2010    RMR: friable anal canal hemorrhoids, hyperplastic ascending polyp, adenomatous descending polyp   . Fall at home 09/10/12  . Melanoma of skin 03/14/2013  . DCIS (ductal carcinoma in situ) of breast 03/14/2013    right breast  . Iron deficiency anemia, unspecified 03/14/2013    secondary to GI blood loss  . Ascending aortic aneurysm     CT in 2012 - 4.2x4.2cm  . Aortic insufficiency   . Venous stasis     edema  . Hypertension   . Hemorrhoids    Past Surgical History  Procedure Laterality Date  . Partial hysterectomy  1976   . Leg surgery    . Breast lumpectomy  1998  . Appendectomy  1942  . Cataract extraction, bilateral    . Melanoma excision Left 08/2006    left leg excision  . Tonsillectomy    . Cholecystectomy    . Varicose vein surgery    . Esophagogastroduodenoscopy  06/05/11    small hiatal hernia; + H.PYLORI GASTRITIS, s/p 5 days of Pylera, unable to finish due to N/V  . Colonoscopy  06/05/11    pancolonic diverticulosis/ileal reosion/abnormal anorectal junction s/p biopsy: path for small intestine and Ti was benign with non-villous atrophy, rectal biopsy with prominent prolapse changes, no acute inflammation  . Corneal transplant Bilateral 2013  . Knee surgery Right 05/2006    total right  . Total hip arthroplasty Left 2001&2004     X 2 FOR LEFT HIP  . Esophagogastroduodenoscopy (egd) with esophageal dilation N/A 06/08/2013    [id number] hiatal hernia;  otherwise normal EGD s/p dilation  . Givens capsule study N/A 06/08/2013    Procedure: GIVENS CAPSULE STUDY;  Surgeon: Daneil Dolin, MD;  Location: [facility];  Service: Endoscopy;  Laterality: N/A;  . Esophagogastroduodenoscopy N/A 06/13/2013    Dr. Oneida Alar- see enteroscopy  . Enteroscopy N/A  06/13/2013    [id number] Gastritis/GI BLEED MOST LIKELY DUE TO DUODENAL ULCERS, AND ? AVMs  . Transthoracic echocardiogram  10/06/2012    EF 55-60%, mod eccentric hypertrophy, grade 2 diastolic dysfunction; mildly calcifed AV annulus with moderate regurg; aortic root mildly dilated; LA severely dailted; PA peak pressure 2mHg  . Nm myocar perf wall motion  03/27/2010    dipyridamole; small reversible basal to mid-septal defect (?artifact), post-stress EF 55%, low risk scan   . Hemorrhoid banding     Family History  Problem Relation Age of Onset  . Colon cancer Neg Hx   . Breast cancer Daughter     also hyperlipidemia  . Breast cancer Daughter     also hyperlipidemia  . Asthma Mother   . Multiple sclerosis Child   . Hyperlipidemia Child    History   Substance Use Topics  . Smoking status: Former Smoker -- 1.50 packs/day for 20 years    Types: Cigarettes    Quit date: 07/05/1975  . Smokeless tobacco: Never Used     Comment: Quit smoking in 1975  . Alcohol Use: No   OB History   Grav Para Term Preterm Abortions TAB SAB Ect Mult Living                 Review of Systems  Genitourinary: Positive for dysuria.  All other systems reviewed and are negative.     Allergies  Codeine; Iron; Asa; and Penicillins  Home Medications   Prior to Admission medications   Medication Sig Start Date End Date Taking? Authorizing Provider  clopidogrel (PLAVIX) 75 MG tablet Take 75 mg by mouth daily with breakfast.   Yes Historical Provider, MD  furosemide (LASIX) 20 MG tablet Take 1 tablet (20 mg total) by mouth daily. 03/13/14  Yes Nat Christen, MD  gabapentin (NEURONTIN) 100 MG capsule Take 2 capsules (200 mg total) by mouth 3 (three) times daily. 03/13/14  Yes Carole Civil, MD  Multiple Vitamins-Minerals (OPTI-VITAMINS PO) Take 1 tablet by mouth daily.   Yes Historical Provider, MD  ondansetron (ZOFRAN) 4 MG tablet Take 1 tablet (4 mg total) by mouth every 8 (eight) hours as needed for nausea or vomiting. 09/13/13  Yes Mahala Menghini, PA-C  pantoprazole (PROTONIX) 40 MG tablet Take 1 tablet (40 mg total) by mouth 2 (two) times daily. 10/04/13  Yes Orvil Feil, NP  prednisoLONE acetate (PRED FORTE) 1 % ophthalmic suspension Place 1 drop into both eyes 3 (three) times daily. 02/11/14  Yes Historical Provider, MD  psyllium (METAMUCIL) 58.6 % packet Take 1 packet by mouth 2 (two) times daily.   Yes Historical Provider, MD  sodium chloride (MURO 128) 2 % ophthalmic solution Place 1 drop into both eyes 3 (three) times daily.    Yes Historical Provider, MD  traMADol-acetaminophen (ULTRACET) 37.5-325 MG per tablet Take 1 tablet by mouth every 6 (six) hours as needed. 01/30/14  Yes Carole Civil, MD   There were no vitals taken for this  visit. Physical Exam  Nursing note and vitals reviewed. Constitutional: She is oriented to person, place, and time. She appears well-developed and well-nourished. No distress.  HENT:  Head: Normocephalic and atraumatic.  Neck: Normal range of motion. Neck supple.  Abdominal: Soft. Bowel sounds are normal. She exhibits no distension. There is no tenderness.  Genitourinary:  There are multiple hemorrhoids noted around the rectum. There is a period of skin breakdown where her depends pad is rubbing her buttock. There is no  significant swelling or fluctuance.  Musculoskeletal: Normal range of motion.  Neurological: She is alert and oriented to person, place, and time.  Skin: Skin is warm and dry. She is not diaphoretic.    ED Course  Procedures (including critical care time) Labs Review Labs Reviewed  URINALYSIS, ROUTINE W REFLEX MICROSCOPIC    Imaging Review No results found.   EKG Interpretation None      MDM   Final diagnoses:  None    UA reveals a UTI. Will be treated with Cipro.  She has multiple hemorrhoids for which I will prescribe a hydrocortisone cream and have her alternate this with Preparation H. I've also advised they applied some sort of coating agent to the sore areas on her buttock. I suspect this is related to the area rubbing against her depends pads.    Veryl Speak, MD 04/05/14 [id number]

## 2014-04-05 NOTE — ED Notes (Signed)
Pt states she has an abscess on her buttocks from where she has been sitting since she fell a few weeks ago. Pt also thinks she has a UTI.

## 2014-04-06 DIAGNOSIS — N390 Urinary tract infection, site not specified: Secondary | ICD-10-CM | POA: Diagnosis not present

## 2014-04-06 DIAGNOSIS — R55 Syncope and collapse: Secondary | ICD-10-CM | POA: Diagnosis not present

## 2014-04-06 DIAGNOSIS — M1990 Unspecified osteoarthritis, unspecified site: Secondary | ICD-10-CM | POA: Diagnosis not present

## 2014-04-06 DIAGNOSIS — M6281 Muscle weakness (generalized): Secondary | ICD-10-CM | POA: Diagnosis not present

## 2014-04-06 DIAGNOSIS — M545 Low back pain: Secondary | ICD-10-CM | POA: Diagnosis not present

## 2014-04-06 DIAGNOSIS — I10 Essential (primary) hypertension: Secondary | ICD-10-CM | POA: Diagnosis not present

## 2014-04-07 ENCOUNTER — Other Ambulatory Visit (HOSPITAL_COMMUNITY): Payer: Self-pay

## 2014-04-11 DIAGNOSIS — M545 Low back pain: Secondary | ICD-10-CM | POA: Diagnosis not present

## 2014-04-11 DIAGNOSIS — M6281 Muscle weakness (generalized): Secondary | ICD-10-CM | POA: Diagnosis not present

## 2014-04-11 DIAGNOSIS — N390 Urinary tract infection, site not specified: Secondary | ICD-10-CM | POA: Diagnosis not present

## 2014-04-11 DIAGNOSIS — I10 Essential (primary) hypertension: Secondary | ICD-10-CM | POA: Diagnosis not present

## 2014-04-11 DIAGNOSIS — M1990 Unspecified osteoarthritis, unspecified site: Secondary | ICD-10-CM | POA: Diagnosis not present

## 2014-04-11 DIAGNOSIS — R55 Syncope and collapse: Secondary | ICD-10-CM | POA: Diagnosis not present

## 2014-04-12 DIAGNOSIS — M1990 Unspecified osteoarthritis, unspecified site: Secondary | ICD-10-CM | POA: Diagnosis not present

## 2014-04-12 DIAGNOSIS — M545 Low back pain: Secondary | ICD-10-CM | POA: Diagnosis not present

## 2014-04-12 DIAGNOSIS — R55 Syncope and collapse: Secondary | ICD-10-CM | POA: Diagnosis not present

## 2014-04-12 DIAGNOSIS — I10 Essential (primary) hypertension: Secondary | ICD-10-CM | POA: Diagnosis not present

## 2014-04-12 DIAGNOSIS — N390 Urinary tract infection, site not specified: Secondary | ICD-10-CM | POA: Diagnosis not present

## 2014-04-12 DIAGNOSIS — M6281 Muscle weakness (generalized): Secondary | ICD-10-CM | POA: Diagnosis not present

## 2014-04-13 ENCOUNTER — Encounter (HOSPITAL_COMMUNITY): Payer: Medicare Other | Attending: Hematology and Oncology

## 2014-04-13 ENCOUNTER — Encounter (HOSPITAL_COMMUNITY): Payer: Medicare Other

## 2014-04-13 ENCOUNTER — Encounter (HOSPITAL_COMMUNITY): Payer: Self-pay

## 2014-04-13 VITALS — BP 163/49 | HR 57 | Temp 97.7°F | Resp 20 | Wt 179.2 lb

## 2014-04-13 DIAGNOSIS — C4370 Malignant melanoma of unspecified lower limb, including hip: Secondary | ICD-10-CM | POA: Insufficient documentation

## 2014-04-13 DIAGNOSIS — N289 Disorder of kidney and ureter, unspecified: Secondary | ICD-10-CM | POA: Diagnosis not present

## 2014-04-13 DIAGNOSIS — Z853 Personal history of malignant neoplasm of breast: Secondary | ICD-10-CM

## 2014-04-13 DIAGNOSIS — I10 Essential (primary) hypertension: Secondary | ICD-10-CM | POA: Diagnosis not present

## 2014-04-13 DIAGNOSIS — Z23 Encounter for immunization: Secondary | ICD-10-CM | POA: Diagnosis not present

## 2014-04-13 DIAGNOSIS — C439 Malignant melanoma of skin, unspecified: Secondary | ICD-10-CM

## 2014-04-13 DIAGNOSIS — Z8673 Personal history of transient ischemic attack (TIA), and cerebral infarction without residual deficits: Secondary | ICD-10-CM | POA: Insufficient documentation

## 2014-04-13 DIAGNOSIS — Z880 Allergy status to penicillin: Secondary | ICD-10-CM | POA: Diagnosis not present

## 2014-04-13 DIAGNOSIS — Z85820 Personal history of malignant melanoma of skin: Secondary | ICD-10-CM | POA: Diagnosis not present

## 2014-04-13 DIAGNOSIS — D509 Iron deficiency anemia, unspecified: Secondary | ICD-10-CM

## 2014-04-13 DIAGNOSIS — D0511 Intraductal carcinoma in situ of right breast: Secondary | ICD-10-CM

## 2014-04-13 DIAGNOSIS — D0590 Unspecified type of carcinoma in situ of unspecified breast: Secondary | ICD-10-CM | POA: Insufficient documentation

## 2014-04-13 LAB — COMPREHENSIVE METABOLIC PANEL
ALBUMIN: 3.7 g/dL (ref 3.5–5.2)
ALK PHOS: 106 U/L (ref 39–117)
ALT: 8 U/L (ref 0–35)
AST: 19 U/L (ref 0–37)
Anion gap: 12 (ref 5–15)
BUN: 13 mg/dL (ref 6–23)
CO2: 30 mEq/L (ref 19–32)
Calcium: 9 mg/dL (ref 8.4–10.5)
Chloride: 102 mEq/L (ref 96–112)
Creatinine, Ser: 0.85 mg/dL (ref 0.50–1.10)
GFR calc Af Amer: 68 mL/min — ABNORMAL LOW (ref >90)
GFR calc non Af Amer: 59 mL/min — ABNORMAL LOW (ref >90)
Glucose, Bld: 93 mg/dL (ref 70–99)
POTASSIUM: 3.5 meq/L — AB (ref 3.7–5.3)
SODIUM: 144 meq/L (ref 137–147)
TOTAL PROTEIN: 7.2 g/dL (ref 6.0–8.3)
Total Bilirubin: 0.5 mg/dL (ref 0.3–1.2)

## 2014-04-13 LAB — CBC WITH DIFFERENTIAL/PLATELET
Basophils Absolute: 0 10*3/uL (ref 0.0–0.1)
Basophils Relative: 0 % (ref 0–1)
EOS PCT: 3 % (ref 0–5)
Eosinophils Absolute: 0.2 10*3/uL (ref 0.0–0.7)
HEMATOCRIT: 38.1 % (ref 36.0–46.0)
HEMOGLOBIN: 12.1 g/dL (ref 12.0–15.0)
LYMPHS ABS: 1.5 10*3/uL (ref 0.7–4.0)
Lymphocytes Relative: 30 % (ref 12–46)
MCH: 29.4 pg (ref 26.0–34.0)
MCHC: 31.8 g/dL (ref 30.0–36.0)
MCV: 92.7 fL (ref 78.0–100.0)
MONOS PCT: 7 % (ref 3–12)
Monocytes Absolute: 0.3 10*3/uL (ref 0.1–1.0)
Neutro Abs: 3 10*3/uL (ref 1.7–7.7)
Neutrophils Relative %: 60 % (ref 43–77)
Platelets: 195 10*3/uL (ref 150–400)
RBC: 4.11 MIL/uL (ref 3.87–5.11)
RDW: 13.8 % (ref 11.5–15.5)
WBC: 5 10*3/uL (ref 4.0–10.5)

## 2014-04-13 LAB — RETICULOCYTES
RBC.: 4.11 MIL/uL (ref 3.87–5.11)
RETIC CT PCT: 1 % (ref 0.4–3.1)
Retic Count, Absolute: 41.1 10*3/uL (ref 19.0–186.0)

## 2014-04-13 LAB — LACTATE DEHYDROGENASE: LDH: 190 U/L (ref 94–250)

## 2014-04-13 MED ORDER — INFLUENZA VAC SPLIT QUAD 0.5 ML IM SUSY
PREFILLED_SYRINGE | INTRAMUSCULAR | Status: AC
  Filled 2014-04-13: qty 0.5

## 2014-04-13 MED ORDER — INFLUENZA VAC SPLIT QUAD 0.5 ML IM SUSY
0.5000 mL | PREFILLED_SYRINGE | INTRAMUSCULAR | Status: AC
  Administered 2014-04-13: 0.5 mL via INTRAMUSCULAR

## 2014-04-13 NOTE — Progress Notes (Signed)
[facility]  OFFICE PROGRESS NOTE  Rocky Morel, MD [place] [address]  DIAGNOSIS: Iron deficiency anemia, unspecified - Plan: Reticulocytes, Lactate dehydrogenase, CBC with Differential, Comprehensive metabolic panel, Ferritin, CANCELED: CBC with Differential, CANCELED: Comprehensive metabolic panel, CANCELED: Ferritin  Melanoma of skin  DCIS (ductal carcinoma in situ) of breast, right  Hepatic hemosiderosis - Plan: Lactate dehydrogenase  Flu vaccine need - Plan: Influenza vac split quadrivalent PF (FLUARIX) injection 0.5 mL  Chief Complaint  Patient presents with  . DCIS right breast  . Melanoma  . Hepatic hemosiderosis  . Iron deficiency    CURRENT THERAPY: Watchful expectation.  INTERVAL HISTORY: [name] [name] 80 y.o. female returns for followup of Stage I (T1 A.) melanoma the of left thigh, superficial spreading-type, thus far without recurrence with resection in December 2007 and re-resection 08/06/2006. She had negative margins at that time.  AND DCIS of the right breast in June 1998 status post surgical resection and she thinks she had radiation therapy as well.  AND Iron deficiency anemia GI blood losses with colonoscopy revealing friable anal canal hemorrhoids and diverticulosis in March 2010 by Dr. Gala Romney. She continues to live independently and actually drives. Appetite is good with pain and hip pain bili after falling a few weeks ago. She sustained no fractures. She does use a walker for stability. She does have problems with intermittent urinary incontinence and sometimes soaks a pad over night. She denies any fever, night sweats, vaginal bleeding or discharge, hematuria, flank pain, night sweats, cough, wheezing, skin rash, headache, or seizures. She is followed closely by her surgeon for recurrent melanoma.    MEDICAL HISTORY: Past Medical History  Diagnosis Date  . History of blood  clots     in leg  . History of knee surgery   . Mini stroke   . Hip fracture     hip surgery 2001  . Stomach ulcer     secondary to h.pylori, s/p treatment  . Thyroid condition   . Bladder infection     h/o  . Kidney infection     h/o  . Melanoma of thigh     left  . Aneurysm, thoracic aortic   . Dehydration     HISTORY   . S/P colonoscopy March 2010    RMR: friable anal canal hemorrhoids, hyperplastic ascending polyp, adenomatous descending polyp   . Fall at home 09/10/12  . Melanoma of skin 03/14/2013  . DCIS (ductal carcinoma in situ) of breast 03/14/2013    right breast  . Iron deficiency anemia, unspecified 03/14/2013    secondary to GI blood loss  . Ascending aortic aneurysm     CT in 2012 - 4.2x4.2cm  . Aortic insufficiency   . Venous stasis     edema  . Hypertension   . Hemorrhoids     INTERIM HISTORY: has LOWER LEG, ARTHRITIS, DEGEN./OSTEO; HIP PAIN; PAIN IN JOINT, LOWER LEG; BURSITIS, HIP; Bladder infection; Anemia; Helicobacter pylori gastritis; S/P total knee replacement; Hip pain; BPPV (benign paroxysmal positional vertigo); RBBB; Ascending aortic aneurysm; Aortic insufficiency; Melanoma of skin; DCIS (ductal carcinoma in situ) of breast; Iron deficiency anemia, unspecified; Early satiety; Loss of weight; Globus sensation; IDA (iron deficiency anemia); Hemorrhoids; and Occult GI bleeding on her problem list.    ALLERGIES:  is allergic to codeine; iron; asa; and penicillins.  MEDICATIONS: has a current medication list which includes the following prescription(s): clopidogrel, furosemide,  gabapentin, hydrocortisone, multiple vitamins-minerals, ondansetron, pantoprazole, prednisolone acetate, psyllium, sodium chloride, tramadol-acetaminophen, and ciprofloxacin.  SURGICAL HISTORY:  Past Surgical History  Procedure Laterality Date  . Partial hysterectomy  1976  . Leg surgery    . Breast lumpectomy  1998  . Appendectomy  1942  . Cataract extraction, bilateral    .  Melanoma excision Left 08/2006    left leg excision  . Tonsillectomy    . Cholecystectomy    . Varicose vein surgery    . Esophagogastroduodenoscopy  06/05/11    small hiatal hernia; + H.PYLORI GASTRITIS, s/p 5 days of Pylera, unable to finish due to N/V  . Colonoscopy  06/05/11    pancolonic diverticulosis/ileal reosion/abnormal anorectal junction s/p biopsy: path for small intestine and Ti was benign with non-villous atrophy, rectal biopsy with prominent prolapse changes, no acute inflammation  . Corneal transplant Bilateral 2013  . Knee surgery Right 05/2006    total right  . Total hip arthroplasty Left 2001&2004     X 2 FOR LEFT HIP  . Esophagogastroduodenoscopy (egd) with esophageal dilation N/A 06/08/2013    VZC:HYIFO hiatal hernia;  otherwise normal EGD s/p dilation  . Givens capsule study N/A 06/08/2013    Procedure: GIVENS CAPSULE STUDY;  Surgeon: Daneil Dolin, MD;  Location: [facility];  Service: Endoscopy;  Laterality: N/A;  . Esophagogastroduodenoscopy N/A 06/13/2013    Dr. Oneida Alar- see enteroscopy  . Enteroscopy N/A 06/13/2013    YDX:AJOI Gastritis/GI BLEED MOST LIKELY DUE TO DUODENAL ULCERS, AND ? AVMs  . Transthoracic echocardiogram  10/06/2012    EF 55-60%, mod eccentric hypertrophy, grade 2 diastolic dysfunction; mildly calcifed AV annulus with moderate regurg; aortic root mildly dilated; LA severely dailted; PA peak pressure 10mg  . Nm myocar perf wall motion  03/27/2010    dipyridamole; small reversible basal to mid-septal defect (?artifact), post-stress EF 55%, low risk scan   . Hemorrhoid banding      FAMILY HISTORY: family history includes Asthma in her mother; Breast cancer in her daughter and daughter; Hyperlipidemia in her child; Multiple sclerosis in her child. There is no history of Colon cancer.  SOCIAL HISTORY:  reports that she quit smoking about 38 years ago. Her smoking use included Cigarettes. She has a 30 pack-year smoking history. She has never used  smokeless tobacco. She reports that she does not drink alcohol or use illicit drugs.  REVIEW OF SYSTEMS:  Other than that discussed above is noncontributory.  PHYSICAL EXAMINATION: ECOG PERFORMANCE STATUS: 1 - Symptomatic but completely ambulatory  Blood pressure 163/49, pulse 57, temperature 97.7 F (36.5 C), temperature source Oral, resp. rate 20, weight 179 lb 3.2 oz (81.285 kg), SpO2 94.00%.  GENERAL:alert, no distress and comfortable SKIN: skin color, texture, turgor are normal, no rashes or significant lesions. EYES: PERLA; Conjunctiva are pink and non-injected, sclera clear SINUSES: No redness or tenderness over maxillary or ethmoid sinuses OROPHARYNX:no exudate, no erythema on lips, buccal mucosa, or tongue. NECK: supple, thyroid normal size, non-tender, without nodularity. No masses CHEST: Status post right breast lumpectomy with induration from radiation. Left breast without mass. LYMPH:  no palpable lymphadenopathy in the cervical, axillary or inguinal LUNGS: clear to auscultation and percussion with normal breathing effort HEART: regular rate & rhythm and no murmurs. ABDOMEN:abdomen soft, non-tender and normal bowel sounds. No hepatomegaly, ascites, or CVA tenderness. MUSCULOSKELETAL:no cyanosis of digits and no clubbing. Range of motion normal. Left lower 70 melanoma site well-healed with no evidence of satellite lesions. NEURO: alert & oriented  x 3 with fluent speech, no focal motor/sensory deficits. Hearing deficit.   LABORATORY DATA: Appointment on 04/13/2014  Component Date Value Ref Range Status  . WBC 04/13/2014 5.0  4.0 - 10.5 K/uL Final  . RBC 04/13/2014 4.11  3.87 - 5.11 MIL/uL Final  . Hemoglobin 04/13/2014 12.1  12.0 - 15.0 g/dL Final  . HCT 04/13/2014 38.1  36.0 - 46.0 % Final  . MCV 04/13/2014 92.7  78.0 - 100.0 fL Final  . MCH 04/13/2014 29.4  26.0 - 34.0 pg Final  . MCHC 04/13/2014 31.8  30.0 - 36.0 g/dL Final  . RDW 04/13/2014 13.8  11.5 - 15.5 % Final    . Platelets 04/13/2014 195  150 - 400 K/uL Final  . Neutrophils Relative % 04/13/2014 60  43 - 77 % Final  . Neutro Abs 04/13/2014 3.0  1.7 - 7.7 K/uL Final  . Lymphocytes Relative 04/13/2014 30  12 - 46 % Final  . Lymphs Abs 04/13/2014 1.5  0.7 - 4.0 K/uL Final  . Monocytes Relative 04/13/2014 7  3 - 12 % Final  . Monocytes Absolute 04/13/2014 0.3  0.1 - 1.0 K/uL Final  . Eosinophils Relative 04/13/2014 3  0 - 5 % Final  . Eosinophils Absolute 04/13/2014 0.2  0.0 - 0.7 K/uL Final  . Basophils Relative 04/13/2014 0  0 - 1 % Final  . Basophils Absolute 04/13/2014 0.0  0.0 - 0.1 K/uL Final  . Sodium 04/13/2014 144  137 - 147 mEq/L Final  . Potassium 04/13/2014 3.5* 3.7 - 5.3 mEq/L Final  . Chloride 04/13/2014 102  96 - 112 mEq/L Final  . CO2 04/13/2014 30  19 - 32 mEq/L Final  . Glucose, Bld 04/13/2014 93  70 - 99 mg/dL Final  . BUN 04/13/2014 13  6 - 23 mg/dL Final  . Creatinine, Ser 04/13/2014 0.85  0.50 - 1.10 mg/dL Final  . Calcium 04/13/2014 9.0  8.4 - 10.5 mg/dL Final  . Total Protein 04/13/2014 7.2  6.0 - 8.3 g/dL Final  . Albumin 04/13/2014 3.7  3.5 - 5.2 g/dL Final  . AST 04/13/2014 19  0 - 37 U/L Final  . ALT 04/13/2014 8  0 - 35 U/L Final  . Alkaline Phosphatase 04/13/2014 106  39 - 117 U/L Final  . Total Bilirubin 04/13/2014 0.5  0.3 - 1.2 mg/dL Final  . GFR calc non Af Amer 04/13/2014 59* >90 mL/min Final  . GFR calc Af Amer 04/13/2014 68* >90 mL/min Final   Comment: (NOTE)                          The eGFR has been calculated using the CKD EPI equation.                          This calculation has not been validated in all clinical situations.                          eGFR's persistently <90 mL/min signify possible Chronic Kidney                          Disease.  . Anion gap 04/13/2014 12  5 - 15 Final  . Retic Ct Pct 04/13/2014 1.0  0.4 - 3.1 % Final  . RBC. 04/13/2014 4.11  3.87 - 5.11 MIL/uL Final  . Retic Count,  Manual 04/13/2014 41.1  19.0 - 186.0 K/uL Final   . LDH 04/13/2014 190  94 - 250 U/L Final  Admission on 04/05/2014, Discharged on 04/05/2014  Component Date Value Ref Range Status  . Color, Urine 04/05/2014 YELLOW  YELLOW Final  . APPearance 04/05/2014 HAZY* CLEAR Final  . Specific Gravity, Urine 04/05/2014 <1.005* 1.005 - 1.030 Final  . pH 04/05/2014 6.0  5.0 - 8.0 Final  . Glucose, UA 04/05/2014 NEGATIVE  NEGATIVE mg/dL Final  . Hgb urine dipstick 04/05/2014 LARGE* NEGATIVE Final  . Bilirubin Urine 04/05/2014 NEGATIVE  NEGATIVE Final  . Ketones, ur 04/05/2014 NEGATIVE  NEGATIVE mg/dL Final  . Protein, ur 04/05/2014 30* NEGATIVE mg/dL Final  . Urobilinogen, UA 04/05/2014 1.0  0.0 - 1.0 mg/dL Final  . Nitrite 04/05/2014 NEGATIVE  NEGATIVE Final  . Leukocytes, UA 04/05/2014 LARGE* NEGATIVE Final  . Squamous Epithelial / LPF 04/05/2014 RARE  RARE Final  . WBC, UA 04/05/2014 TOO NUMEROUS TO COUNT  <3 WBC/hpf Final  . RBC / HPF 04/05/2014 0-2  <3 RBC/hpf Final  . Bacteria, UA 04/05/2014 MANY* RARE Final  . Edwina Barth 04/05/2014 MICROSCOPIC EXAM PERFORMED ON UNCONCENTRATED URINE   Final    PATHOLOGY: Noninvasive ductal neoplasia of the right breast, status post lumpectomy and radiation, 1998.  Urinalysis    Component Value Date/Time   COLORURINE YELLOW 04/05/2014 0530   APPEARANCEUR HAZY* 04/05/2014 0530   LABSPEC <1.005* 04/05/2014 0530   PHURINE 6.0 04/05/2014 0530   GLUCOSEU NEGATIVE 04/05/2014 0530   HGBUR LARGE* 04/05/2014 0530   BILIRUBINUR NEGATIVE 04/05/2014 0530   KETONESUR NEGATIVE 04/05/2014 0530   PROTEINUR 30* 04/05/2014 0530   UROBILINOGEN 1.0 04/05/2014 0530   NITRITE NEGATIVE 04/05/2014 0530   LEUKOCYTESUR LARGE* 04/05/2014 0530    RADIOGRAPHIC STUDIES:  MM Digital Screening Status: Final result            Study Result    CLINICAL DATA: Screening.  EXAM:  DIGITAL SCREENING BILATERAL MAMMOGRAM WITH CAD  COMPARISON: Previous exam(s).  ACR Breast Density Category b: There are scattered areas of  fibroglandular density.   FINDINGS:  There are no findings suspicious for malignancy. Images were  processed with CAD.  IMPRESSION:  No mammographic evidence of malignancy. A result letter of this  screening mammogram will be mailed directly to the patient.  RECOMMENDATION:  Screening mammogram in one year. (Code:SM-B-01Y)  BI-RADS CATEGORY 1: Negative.  Electronically Signed  By: Lillia Mountain M.D.  On: 09/09/2013 10:42      MR Abdomen W Wo Contrast Status: Final result         PACS Images    Show images for MR Abdomen W Wo Contrast         Study Result    CLINICAL DATA: Right renal lesion on CT.  EXAM:  MRI ABDOMEN WITHOUT AND WITH CONTRAST  TECHNIQUE:  Multiplanar multisequence MR imaging of the abdomen was performed  both before and after the administration of intravenous contrast.  CONTRAST: 25m MULTIHANCE GADOBENATE DIMEGLUMINE 529 MG/ML IV SOLN  COMPARISON: CT ENTERO ABD/PELVIS W/CM dated 09/09/2013; MR L SPINE  W/O dated 12/01/2012; MR L SPINE W/O dated 03/07/2005  FINDINGS:  Portions of exam are mildly motion degraded. Moderate cardiomegaly.  Both the liver and spleen demonstrate T2 hypo intensity and signal  dropout on the long TE images, consistent with iron deposition. The  liver is mildly enlarged, at 18.2 cm craniocaudal.  Small hiatal hernia. Normal pancreas. Cholecystectomy, without  biliary ductal dilatation. Normal adrenal glands.  No abdominal  adenopathy or ascites.  Tiny cysts are identified within both kidneys. A minimally complex  cyst is identified exophytic of the interpolar left kidney, and  measures 2.4 cm. This demonstrates complexity in its dependent  portion on image 30/ series 9.  A hemorrhagic/proteinaceous cyst is identified at the upper pole  right kidney. This measures 1.0 cm on image 12/series 9.  Corresponding to the CT abnormality, within the lateral aspect of  the inter/lower pole right kidney, is a 8 mm lesion on image 35/  series 17 and image  35/series 18. This is a minimally T2  hypointense, T1 isointense, and demonstrates probable post-contrast  enhancement, including on image 35/series 18.  IMPRESSION:  1. Corresponding to the CT abnormality, within the inter/lower pole  right kidney, is an 8 mm lesion which is suspicious for a small  renal cell carcinoma. Given patient age, this is of questionable  clinical significance. If surgical resection is not planned,  ultrasound surveillance or followup CT could be performed at 1 year  to confirm relative size stability.  2. Mildly motion degraded exam.  3. Hemosiderosis and mild hepatomegaly.  Electronically Signed  By: Abigail Miyamoto M.D.  On: 09/16/2013 11:      ASSESSMENT:  1. Stage I (T1 A.) melanoma the of left thigh, superficial spreading-type, thus far without recurrence with resection in December 2007 and re-resection 08/06/2006. She had negative margins at that time.  2. DCIS of the right breast in June 1998 status post lumpectomy post radiotherapy, he undergo yearly mammograms 3. Iron deficiency anemia GI blood losses with colonoscopy revealing friable anal canal hemorrhoids and diverticulosis in March 2010 by Dr. Sydell Axon  4. Right knee replacement 2007 by Dr. Aline Brochure  5. Left hip replacement x2 by Dr. Eulas Post years ago  6. Small right renal lesion suspicious for malignancy, no further workup planned at this time.    PLAN:  #1. If ferritin is low, IV iron will be administered. #2. Mammogram in February 2016. #3. Influenza virus vaccine was administered. #4. Followup in one year with CBC, chem profile, ferritin.   All questions were answered. The patient knows to call the clinic with any problems, questions or concerns. We can certainly see the patient much sooner if necessary.   I spent 25 minutes counseling the patient face to face. The total time spent in the appointment was 30 minutes.    Doroteo Bradford, MD 04/13/2014 7:04 PM  DISCLAIMER:  This note  was dictated with voice recognition software.  Similar sounding words can inadvertently be transcribed inaccurately and may not be corrected upon review.

## 2014-04-13 NOTE — Progress Notes (Signed)
[name] [name]'s reason for visit today is for labs as scheduled per MD orders.  Venipuncture performed with a 23 gauge butterfly needle to L Antecubital.  [name] [name] tolerated procedure well and without incident; questions were answered and patient was discharged.   [name] [name] Also received fluarix vaccine while in the clinic today

## 2014-04-13 NOTE — Patient Instructions (Signed)
[facility] Discharge Instructions  RECOMMENDATIONS MADE BY THE CONSULTANT AND ANY TEST RESULTS WILL BE SENT TO YOUR REFERRING PHYSICIAN.  EXAM FINDINGS BY THE PHYSICIAN TODAY AND SIGNS OR SYMPTOMS TO REPORT TO CLINIC OR PRIMARY PHYSICIAN: You saw Dr Barnet Glasgow today  Follow up in 1 year with lab work, we will call if your ferritin is low to schedule iron infusion  Thank you for choosing [facility] to provide your oncology and hematology care.  To afford each patient quality time with our providers, please arrive at least 15 minutes before your scheduled appointment time.  With your help, our goal is to use those 15 minutes to complete the necessary work-up to ensure our physicians have the information they need to help with your evaluation and healthcare recommendations.    Effective January 1st, 2014, we ask that you re-schedule your appointment with our physicians should you arrive 10 or more minutes late for your appointment.  We strive to give you quality time with our providers, and arriving late affects you and other patients whose appointments are after yours.    Again, thank you for choosing [facility].  Our hope is that these requests will decrease the amount of time that you wait before being seen by our physicians.       _____________________________________________________________  Should you have questions after your visit to [facility], please contact our office at [phone] between the hours of 8:30 a.m. and 5:00 p.m.  Voicemails left after 4:30 p.m. will not be returned until the following business day.  For prescription refill requests, have your pharmacy contact our office with your prescription refill request.

## 2014-04-14 DIAGNOSIS — N390 Urinary tract infection, site not specified: Secondary | ICD-10-CM | POA: Diagnosis not present

## 2014-04-14 DIAGNOSIS — I10 Essential (primary) hypertension: Secondary | ICD-10-CM | POA: Diagnosis not present

## 2014-04-14 DIAGNOSIS — R55 Syncope and collapse: Secondary | ICD-10-CM | POA: Diagnosis not present

## 2014-04-14 DIAGNOSIS — M1990 Unspecified osteoarthritis, unspecified site: Secondary | ICD-10-CM | POA: Diagnosis not present

## 2014-04-14 DIAGNOSIS — M6281 Muscle weakness (generalized): Secondary | ICD-10-CM | POA: Diagnosis not present

## 2014-04-14 DIAGNOSIS — M545 Low back pain: Secondary | ICD-10-CM | POA: Diagnosis not present

## 2014-04-14 LAB — FERRITIN: Ferritin: 179 ng/mL (ref 10–291)

## 2014-04-14 LAB — IRON AND TIBC
Iron: 42 ug/dL (ref 42–135)
Saturation Ratios: 15 % — ABNORMAL LOW (ref 20–55)
TIBC: 288 ug/dL (ref 250–470)
UIBC: 246 ug/dL (ref 125–400)

## 2014-04-16 DIAGNOSIS — N390 Urinary tract infection, site not specified: Secondary | ICD-10-CM | POA: Diagnosis not present

## 2014-04-16 DIAGNOSIS — R32 Unspecified urinary incontinence: Secondary | ICD-10-CM | POA: Diagnosis not present

## 2014-04-17 DIAGNOSIS — M1990 Unspecified osteoarthritis, unspecified site: Secondary | ICD-10-CM | POA: Diagnosis not present

## 2014-04-17 DIAGNOSIS — I10 Essential (primary) hypertension: Secondary | ICD-10-CM | POA: Diagnosis not present

## 2014-04-17 DIAGNOSIS — M6281 Muscle weakness (generalized): Secondary | ICD-10-CM | POA: Diagnosis not present

## 2014-04-17 DIAGNOSIS — R55 Syncope and collapse: Secondary | ICD-10-CM | POA: Diagnosis not present

## 2014-04-17 DIAGNOSIS — M545 Low back pain: Secondary | ICD-10-CM | POA: Diagnosis not present

## 2014-04-17 DIAGNOSIS — N390 Urinary tract infection, site not specified: Secondary | ICD-10-CM | POA: Diagnosis not present

## 2014-04-18 LAB — NEURON-SPECIFIC ENOLASE(NSE), BLOOD: Neuron Specific Enolase: 5.3 ng/mL (ref <10.8)

## 2014-04-24 DIAGNOSIS — M1990 Unspecified osteoarthritis, unspecified site: Secondary | ICD-10-CM | POA: Diagnosis not present

## 2014-04-24 DIAGNOSIS — M6281 Muscle weakness (generalized): Secondary | ICD-10-CM | POA: Diagnosis not present

## 2014-04-24 DIAGNOSIS — N390 Urinary tract infection, site not specified: Secondary | ICD-10-CM | POA: Diagnosis not present

## 2014-04-24 DIAGNOSIS — I10 Essential (primary) hypertension: Secondary | ICD-10-CM | POA: Diagnosis not present

## 2014-04-24 DIAGNOSIS — R55 Syncope and collapse: Secondary | ICD-10-CM | POA: Diagnosis not present

## 2014-04-24 DIAGNOSIS — M545 Low back pain: Secondary | ICD-10-CM | POA: Diagnosis not present

## 2014-04-25 DIAGNOSIS — M1990 Unspecified osteoarthritis, unspecified site: Secondary | ICD-10-CM | POA: Diagnosis not present

## 2014-04-25 DIAGNOSIS — M6281 Muscle weakness (generalized): Secondary | ICD-10-CM | POA: Diagnosis not present

## 2014-04-25 DIAGNOSIS — I10 Essential (primary) hypertension: Secondary | ICD-10-CM | POA: Diagnosis not present

## 2014-04-25 DIAGNOSIS — N390 Urinary tract infection, site not specified: Secondary | ICD-10-CM | POA: Diagnosis not present

## 2014-04-25 DIAGNOSIS — M545 Low back pain, unspecified: Secondary | ICD-10-CM | POA: Diagnosis not present

## 2014-04-26 DIAGNOSIS — M545 Low back pain: Secondary | ICD-10-CM | POA: Diagnosis not present

## 2014-04-26 DIAGNOSIS — R55 Syncope and collapse: Secondary | ICD-10-CM | POA: Diagnosis not present

## 2014-04-26 DIAGNOSIS — M1990 Unspecified osteoarthritis, unspecified site: Secondary | ICD-10-CM | POA: Diagnosis not present

## 2014-04-26 DIAGNOSIS — M6281 Muscle weakness (generalized): Secondary | ICD-10-CM | POA: Diagnosis not present

## 2014-04-26 DIAGNOSIS — N390 Urinary tract infection, site not specified: Secondary | ICD-10-CM | POA: Diagnosis not present

## 2014-04-26 DIAGNOSIS — I10 Essential (primary) hypertension: Secondary | ICD-10-CM | POA: Diagnosis not present

## 2014-04-27 ENCOUNTER — Ambulatory Visit (INDEPENDENT_AMBULATORY_CARE_PROVIDER_SITE_OTHER): Payer: Medicare Other | Admitting: Orthopedic Surgery

## 2014-04-27 ENCOUNTER — Encounter: Payer: Self-pay | Admitting: Orthopedic Surgery

## 2014-04-27 ENCOUNTER — Ambulatory Visit (INDEPENDENT_AMBULATORY_CARE_PROVIDER_SITE_OTHER): Payer: Medicare Other

## 2014-04-27 VITALS — BP 164/79 | Ht 66.0 in | Wt 179.2 lb

## 2014-04-27 DIAGNOSIS — M545 Low back pain, unspecified: Secondary | ICD-10-CM

## 2014-04-27 DIAGNOSIS — M6281 Muscle weakness (generalized): Secondary | ICD-10-CM | POA: Diagnosis not present

## 2014-04-27 DIAGNOSIS — N390 Urinary tract infection, site not specified: Secondary | ICD-10-CM | POA: Diagnosis not present

## 2014-04-27 DIAGNOSIS — R55 Syncope and collapse: Secondary | ICD-10-CM | POA: Diagnosis not present

## 2014-04-27 DIAGNOSIS — I10 Essential (primary) hypertension: Secondary | ICD-10-CM | POA: Diagnosis not present

## 2014-04-27 DIAGNOSIS — M1990 Unspecified osteoarthritis, unspecified site: Secondary | ICD-10-CM | POA: Diagnosis not present

## 2014-04-27 MED ORDER — TRAMADOL HCL 50 MG PO TABS: 50.0000 mg | ORAL_TABLET | ORAL | Status: DC | PRN

## 2014-04-27 NOTE — Progress Notes (Signed)
Established patient new problem   Today we have a 80 year old female status post left total hip and right total knee she presents with acute onset of pain in her legs with numbness and tingling after falling onto her bottom 5 weeks ago. She has not improved with Ultracet and is still having pain numbness and tingling.   She has a history of spinal stenosis like lesions in the lumbar spines documented by MRI in 2014 status post ESI with good result also in June of 2014. She denies now with pain swelling aching sharp discomfort in her lower back and tingling of both legs. She is able to her with a walker. Her pain has become constant and is worse with walking and bending forward.  Review of systems fatigue shortness of breath ankle swelling frequent urination excessive urination at night weakness and lightheadedness on occasion skin abrasions joint pain limb pain swollen joints back pain  Past Medical History  Diagnosis Date  . History of blood clots     in leg  . History of knee surgery   . Mini stroke   . Hip fracture     hip surgery 2001  . Stomach ulcer     secondary to h.pylori, s/p treatment  . Thyroid condition   . Bladder infection     h/o  . Kidney infection     h/o  . Melanoma of thigh     left  . Aneurysm, thoracic aortic   . Dehydration     HISTORY   . S/P colonoscopy March 2010    RMR: friable anal canal hemorrhoids, hyperplastic ascending polyp, adenomatous descending polyp   . Fall at home 09/10/12  . Melanoma of skin 03/14/2013  . DCIS (ductal carcinoma in situ) of breast 03/14/2013    right breast  . Iron deficiency anemia, unspecified 03/14/2013    secondary to GI blood loss  . Ascending aortic aneurysm     CT in 2012 - 4.2x4.2cm  . Aortic insufficiency   . Venous stasis     edema  . Hypertension   . Hemorrhoids    BP 164/79  Ht 5\' 6"  (1.676 m)  Wt 179 lb 3.2 oz (81.285 kg)  BMI 28.94 kg/m2 She remained awake alert and oriented x3 she ambulates with a walker  pretty good actually. Her mood and affect are normal. Her appearance is normal she is no gross abnormalities.L3-4:  Borderline bilateral subarticular lateral recess stenosis due to facet arthropathy and mild disc bulge.   L4-5:  Moderate left and mild right subarticular lateral recess stenosis and borderline bilateral foraminal stenosis secondary to facet arthropathy, disc bulge, and small central disc protrusion. Findings are mildly worsened at this level.   L5-S1:  Mild bilateral subarticular lateral recess stenosis secondary to disc uncovering and facet arthropathy.  Stable findings at this level.   IMPRESSION:   1.  Lumbar spondylosis and degenerative disc disease causing moderate impingement at L4-5 and mild impingement at L5-S1 as detailed above.  The findings at L4-5 are mildly progressive compared the prior exam.  Meds ordered this encounter  Medications  . traMADol (ULTRAM) 50 MG tablet    Sig: Take 1 tablet (50 mg total) by mouth every 4 (four) hours as needed.    Dispense:  120 tablet    Refill:  2    Override previous ultracet prescription   I changed her medication to take the Tylenol under the Ultracet to allow her to get more medication on board she  is same position.   She is tender across her lower back SI joints are tender lumbar spine is tender. She has normal range of motion in her hips and knees no instability normal strength the skin is normal pulses are good she has some peripheral edema which is mild she has normal sensation in the legs is soft touch her reflexes are 01+ at the knee and ankle. Straight leg raise seated position negative  X-rays of the lumbar spine show diffuse osteopenia deformed vertebrae but no acute fracture can be seen although bony detail is obscured by the severe nature of her osteoporosis and osteopenia  Her MRI from 2014 showed

## 2014-04-27 NOTE — Patient Instructions (Signed)
We will refer you back to Dr Ace Gins for another epidural injection

## 2014-04-28 ENCOUNTER — Other Ambulatory Visit: Payer: Self-pay | Admitting: *Deleted

## 2014-04-28 DIAGNOSIS — M5440 Lumbago with sciatica, unspecified side: Secondary | ICD-10-CM

## 2014-05-01 DIAGNOSIS — M6281 Muscle weakness (generalized): Secondary | ICD-10-CM | POA: Diagnosis not present

## 2014-05-01 DIAGNOSIS — M545 Low back pain: Secondary | ICD-10-CM | POA: Diagnosis not present

## 2014-05-01 DIAGNOSIS — N390 Urinary tract infection, site not specified: Secondary | ICD-10-CM | POA: Diagnosis not present

## 2014-05-01 DIAGNOSIS — R55 Syncope and collapse: Secondary | ICD-10-CM | POA: Diagnosis not present

## 2014-05-01 DIAGNOSIS — M1990 Unspecified osteoarthritis, unspecified site: Secondary | ICD-10-CM | POA: Diagnosis not present

## 2014-05-01 DIAGNOSIS — I10 Essential (primary) hypertension: Secondary | ICD-10-CM | POA: Diagnosis not present

## 2014-05-05 DIAGNOSIS — M1990 Unspecified osteoarthritis, unspecified site: Secondary | ICD-10-CM | POA: Diagnosis not present

## 2014-05-05 DIAGNOSIS — M545 Low back pain: Secondary | ICD-10-CM | POA: Diagnosis not present

## 2014-05-05 DIAGNOSIS — R55 Syncope and collapse: Secondary | ICD-10-CM | POA: Diagnosis not present

## 2014-05-05 DIAGNOSIS — I10 Essential (primary) hypertension: Secondary | ICD-10-CM | POA: Diagnosis not present

## 2014-05-05 DIAGNOSIS — M6281 Muscle weakness (generalized): Secondary | ICD-10-CM | POA: Diagnosis not present

## 2014-05-05 DIAGNOSIS — N390 Urinary tract infection, site not specified: Secondary | ICD-10-CM | POA: Diagnosis not present

## 2014-05-08 ENCOUNTER — Telehealth: Payer: Self-pay | Admitting: *Deleted

## 2014-05-08 DIAGNOSIS — M545 Low back pain: Secondary | ICD-10-CM | POA: Diagnosis not present

## 2014-05-08 DIAGNOSIS — R55 Syncope and collapse: Secondary | ICD-10-CM | POA: Diagnosis not present

## 2014-05-08 DIAGNOSIS — M6281 Muscle weakness (generalized): Secondary | ICD-10-CM | POA: Diagnosis not present

## 2014-05-08 DIAGNOSIS — N390 Urinary tract infection, site not specified: Secondary | ICD-10-CM | POA: Diagnosis not present

## 2014-05-08 DIAGNOSIS — I10 Essential (primary) hypertension: Secondary | ICD-10-CM | POA: Diagnosis not present

## 2014-05-08 DIAGNOSIS — M1990 Unspecified osteoarthritis, unspecified site: Secondary | ICD-10-CM | POA: Diagnosis not present

## 2014-05-08 NOTE — Telephone Encounter (Signed)
PATIENT HAS APPOINTMENT AT [facility] 05/10/14 @ 10:20A

## 2014-05-09 DIAGNOSIS — R350 Frequency of micturition: Secondary | ICD-10-CM | POA: Diagnosis not present

## 2014-05-09 DIAGNOSIS — M6281 Muscle weakness (generalized): Secondary | ICD-10-CM | POA: Diagnosis not present

## 2014-05-09 DIAGNOSIS — M1990 Unspecified osteoarthritis, unspecified site: Secondary | ICD-10-CM | POA: Diagnosis not present

## 2014-05-09 DIAGNOSIS — R55 Syncope and collapse: Secondary | ICD-10-CM | POA: Diagnosis not present

## 2014-05-09 DIAGNOSIS — I10 Essential (primary) hypertension: Secondary | ICD-10-CM | POA: Diagnosis not present

## 2014-05-09 DIAGNOSIS — E119 Type 2 diabetes mellitus without complications: Secondary | ICD-10-CM | POA: Diagnosis not present

## 2014-05-09 DIAGNOSIS — N390 Urinary tract infection, site not specified: Secondary | ICD-10-CM | POA: Diagnosis not present

## 2014-05-09 DIAGNOSIS — M545 Low back pain: Secondary | ICD-10-CM | POA: Diagnosis not present

## 2014-05-09 DIAGNOSIS — Z87440 Personal history of urinary (tract) infections: Secondary | ICD-10-CM | POA: Diagnosis not present

## 2014-05-09 DIAGNOSIS — R32 Unspecified urinary incontinence: Secondary | ICD-10-CM | POA: Diagnosis not present

## 2014-05-10 DIAGNOSIS — M6281 Muscle weakness (generalized): Secondary | ICD-10-CM | POA: Diagnosis not present

## 2014-05-10 DIAGNOSIS — M1990 Unspecified osteoarthritis, unspecified site: Secondary | ICD-10-CM | POA: Diagnosis not present

## 2014-05-10 DIAGNOSIS — M545 Low back pain: Secondary | ICD-10-CM | POA: Diagnosis not present

## 2014-05-10 DIAGNOSIS — N390 Urinary tract infection, site not specified: Secondary | ICD-10-CM | POA: Diagnosis not present

## 2014-05-10 DIAGNOSIS — I10 Essential (primary) hypertension: Secondary | ICD-10-CM | POA: Diagnosis not present

## 2014-05-10 DIAGNOSIS — R55 Syncope and collapse: Secondary | ICD-10-CM | POA: Diagnosis not present

## 2014-05-11 DIAGNOSIS — M1990 Unspecified osteoarthritis, unspecified site: Secondary | ICD-10-CM | POA: Diagnosis not present

## 2014-05-11 DIAGNOSIS — M545 Low back pain: Secondary | ICD-10-CM | POA: Diagnosis not present

## 2014-05-11 DIAGNOSIS — M6281 Muscle weakness (generalized): Secondary | ICD-10-CM | POA: Diagnosis not present

## 2014-05-11 DIAGNOSIS — I10 Essential (primary) hypertension: Secondary | ICD-10-CM | POA: Diagnosis not present

## 2014-05-11 DIAGNOSIS — N390 Urinary tract infection, site not specified: Secondary | ICD-10-CM | POA: Diagnosis not present

## 2014-05-11 DIAGNOSIS — R55 Syncope and collapse: Secondary | ICD-10-CM | POA: Diagnosis not present

## 2014-05-15 DIAGNOSIS — I10 Essential (primary) hypertension: Secondary | ICD-10-CM | POA: Diagnosis not present

## 2014-05-15 DIAGNOSIS — M6281 Muscle weakness (generalized): Secondary | ICD-10-CM | POA: Diagnosis not present

## 2014-05-15 DIAGNOSIS — N390 Urinary tract infection, site not specified: Secondary | ICD-10-CM | POA: Diagnosis not present

## 2014-05-15 DIAGNOSIS — M1990 Unspecified osteoarthritis, unspecified site: Secondary | ICD-10-CM | POA: Diagnosis not present

## 2014-05-15 DIAGNOSIS — M545 Low back pain: Secondary | ICD-10-CM | POA: Diagnosis not present

## 2014-05-15 DIAGNOSIS — R55 Syncope and collapse: Secondary | ICD-10-CM | POA: Diagnosis not present

## 2014-05-17 DIAGNOSIS — M545 Low back pain: Secondary | ICD-10-CM | POA: Diagnosis not present

## 2014-05-17 DIAGNOSIS — M4806 Spinal stenosis, lumbar region: Secondary | ICD-10-CM | POA: Diagnosis not present

## 2014-05-17 DIAGNOSIS — M5417 Radiculopathy, lumbosacral region: Secondary | ICD-10-CM | POA: Diagnosis not present

## 2014-05-17 DIAGNOSIS — G894 Chronic pain syndrome: Secondary | ICD-10-CM | POA: Diagnosis not present

## 2014-05-18 DIAGNOSIS — M1990 Unspecified osteoarthritis, unspecified site: Secondary | ICD-10-CM | POA: Diagnosis not present

## 2014-05-18 DIAGNOSIS — N390 Urinary tract infection, site not specified: Secondary | ICD-10-CM | POA: Diagnosis not present

## 2014-05-18 DIAGNOSIS — I10 Essential (primary) hypertension: Secondary | ICD-10-CM | POA: Diagnosis not present

## 2014-05-18 DIAGNOSIS — M545 Low back pain: Secondary | ICD-10-CM | POA: Diagnosis not present

## 2014-05-18 DIAGNOSIS — R55 Syncope and collapse: Secondary | ICD-10-CM | POA: Diagnosis not present

## 2014-05-18 DIAGNOSIS — M6281 Muscle weakness (generalized): Secondary | ICD-10-CM | POA: Diagnosis not present

## 2014-05-19 DIAGNOSIS — M6281 Muscle weakness (generalized): Secondary | ICD-10-CM | POA: Diagnosis not present

## 2014-05-19 DIAGNOSIS — M545 Low back pain: Secondary | ICD-10-CM | POA: Diagnosis not present

## 2014-05-19 DIAGNOSIS — N390 Urinary tract infection, site not specified: Secondary | ICD-10-CM | POA: Diagnosis not present

## 2014-05-19 DIAGNOSIS — R55 Syncope and collapse: Secondary | ICD-10-CM | POA: Diagnosis not present

## 2014-05-19 DIAGNOSIS — M1990 Unspecified osteoarthritis, unspecified site: Secondary | ICD-10-CM | POA: Diagnosis not present

## 2014-05-19 DIAGNOSIS — I10 Essential (primary) hypertension: Secondary | ICD-10-CM | POA: Diagnosis not present

## 2014-05-20 DIAGNOSIS — I10 Essential (primary) hypertension: Secondary | ICD-10-CM | POA: Diagnosis not present

## 2014-05-20 DIAGNOSIS — M6281 Muscle weakness (generalized): Secondary | ICD-10-CM | POA: Diagnosis not present

## 2014-05-20 DIAGNOSIS — L89152 Pressure ulcer of sacral region, stage 2: Secondary | ICD-10-CM | POA: Diagnosis not present

## 2014-05-20 DIAGNOSIS — Z4800 Encounter for change or removal of nonsurgical wound dressing: Secondary | ICD-10-CM | POA: Diagnosis not present

## 2014-05-20 DIAGNOSIS — M1990 Unspecified osteoarthritis, unspecified site: Secondary | ICD-10-CM | POA: Diagnosis not present

## 2014-05-20 DIAGNOSIS — R32 Unspecified urinary incontinence: Secondary | ICD-10-CM | POA: Diagnosis not present

## 2014-05-20 DIAGNOSIS — Z87440 Personal history of urinary (tract) infections: Secondary | ICD-10-CM | POA: Diagnosis not present

## 2014-05-22 DIAGNOSIS — Z87440 Personal history of urinary (tract) infections: Secondary | ICD-10-CM | POA: Diagnosis not present

## 2014-05-22 DIAGNOSIS — I10 Essential (primary) hypertension: Secondary | ICD-10-CM | POA: Diagnosis not present

## 2014-05-22 DIAGNOSIS — L89152 Pressure ulcer of sacral region, stage 2: Secondary | ICD-10-CM | POA: Diagnosis not present

## 2014-05-22 DIAGNOSIS — M6281 Muscle weakness (generalized): Secondary | ICD-10-CM | POA: Diagnosis not present

## 2014-05-22 DIAGNOSIS — R32 Unspecified urinary incontinence: Secondary | ICD-10-CM | POA: Diagnosis not present

## 2014-05-22 DIAGNOSIS — M1990 Unspecified osteoarthritis, unspecified site: Secondary | ICD-10-CM | POA: Diagnosis not present

## 2014-05-24 DIAGNOSIS — Z87440 Personal history of urinary (tract) infections: Secondary | ICD-10-CM | POA: Diagnosis not present

## 2014-05-24 DIAGNOSIS — R32 Unspecified urinary incontinence: Secondary | ICD-10-CM | POA: Diagnosis not present

## 2014-05-24 DIAGNOSIS — M1990 Unspecified osteoarthritis, unspecified site: Secondary | ICD-10-CM | POA: Diagnosis not present

## 2014-05-24 DIAGNOSIS — I10 Essential (primary) hypertension: Secondary | ICD-10-CM | POA: Diagnosis not present

## 2014-05-24 DIAGNOSIS — L89152 Pressure ulcer of sacral region, stage 2: Secondary | ICD-10-CM | POA: Diagnosis not present

## 2014-05-24 DIAGNOSIS — M6281 Muscle weakness (generalized): Secondary | ICD-10-CM | POA: Diagnosis not present

## 2014-05-26 DIAGNOSIS — M1990 Unspecified osteoarthritis, unspecified site: Secondary | ICD-10-CM | POA: Diagnosis not present

## 2014-05-26 DIAGNOSIS — I10 Essential (primary) hypertension: Secondary | ICD-10-CM | POA: Diagnosis not present

## 2014-05-26 DIAGNOSIS — L89152 Pressure ulcer of sacral region, stage 2: Secondary | ICD-10-CM | POA: Diagnosis not present

## 2014-05-26 DIAGNOSIS — R32 Unspecified urinary incontinence: Secondary | ICD-10-CM | POA: Diagnosis not present

## 2014-05-26 DIAGNOSIS — Z87440 Personal history of urinary (tract) infections: Secondary | ICD-10-CM | POA: Diagnosis not present

## 2014-05-26 DIAGNOSIS — M6281 Muscle weakness (generalized): Secondary | ICD-10-CM | POA: Diagnosis not present

## 2014-05-29 DIAGNOSIS — M6281 Muscle weakness (generalized): Secondary | ICD-10-CM | POA: Diagnosis not present

## 2014-05-29 DIAGNOSIS — R32 Unspecified urinary incontinence: Secondary | ICD-10-CM | POA: Diagnosis not present

## 2014-05-29 DIAGNOSIS — L89152 Pressure ulcer of sacral region, stage 2: Secondary | ICD-10-CM | POA: Diagnosis not present

## 2014-05-29 DIAGNOSIS — I10 Essential (primary) hypertension: Secondary | ICD-10-CM | POA: Diagnosis not present

## 2014-05-29 DIAGNOSIS — Z87440 Personal history of urinary (tract) infections: Secondary | ICD-10-CM | POA: Diagnosis not present

## 2014-05-29 DIAGNOSIS — M1990 Unspecified osteoarthritis, unspecified site: Secondary | ICD-10-CM | POA: Diagnosis not present

## 2014-05-31 DIAGNOSIS — L89152 Pressure ulcer of sacral region, stage 2: Secondary | ICD-10-CM | POA: Diagnosis not present

## 2014-05-31 DIAGNOSIS — Z87440 Personal history of urinary (tract) infections: Secondary | ICD-10-CM | POA: Diagnosis not present

## 2014-05-31 DIAGNOSIS — M1990 Unspecified osteoarthritis, unspecified site: Secondary | ICD-10-CM | POA: Diagnosis not present

## 2014-05-31 DIAGNOSIS — I10 Essential (primary) hypertension: Secondary | ICD-10-CM | POA: Diagnosis not present

## 2014-05-31 DIAGNOSIS — R32 Unspecified urinary incontinence: Secondary | ICD-10-CM | POA: Diagnosis not present

## 2014-05-31 DIAGNOSIS — M6281 Muscle weakness (generalized): Secondary | ICD-10-CM | POA: Diagnosis not present

## 2014-06-05 DIAGNOSIS — M6281 Muscle weakness (generalized): Secondary | ICD-10-CM | POA: Diagnosis not present

## 2014-06-05 DIAGNOSIS — Z87440 Personal history of urinary (tract) infections: Secondary | ICD-10-CM | POA: Diagnosis not present

## 2014-06-05 DIAGNOSIS — M1990 Unspecified osteoarthritis, unspecified site: Secondary | ICD-10-CM | POA: Diagnosis not present

## 2014-06-05 DIAGNOSIS — R32 Unspecified urinary incontinence: Secondary | ICD-10-CM | POA: Diagnosis not present

## 2014-06-05 DIAGNOSIS — L89139 Pressure ulcer of right lower back, unspecified stage: Secondary | ICD-10-CM | POA: Diagnosis not present

## 2014-06-05 DIAGNOSIS — I10 Essential (primary) hypertension: Secondary | ICD-10-CM | POA: Diagnosis not present

## 2014-06-05 DIAGNOSIS — L89152 Pressure ulcer of sacral region, stage 2: Secondary | ICD-10-CM | POA: Diagnosis not present

## 2014-06-06 DIAGNOSIS — Z87440 Personal history of urinary (tract) infections: Secondary | ICD-10-CM | POA: Diagnosis not present

## 2014-06-06 DIAGNOSIS — M1990 Unspecified osteoarthritis, unspecified site: Secondary | ICD-10-CM | POA: Diagnosis not present

## 2014-06-06 DIAGNOSIS — M6281 Muscle weakness (generalized): Secondary | ICD-10-CM | POA: Diagnosis not present

## 2014-06-06 DIAGNOSIS — L89152 Pressure ulcer of sacral region, stage 2: Secondary | ICD-10-CM | POA: Diagnosis not present

## 2014-06-06 DIAGNOSIS — I10 Essential (primary) hypertension: Secondary | ICD-10-CM | POA: Diagnosis not present

## 2014-06-06 DIAGNOSIS — R32 Unspecified urinary incontinence: Secondary | ICD-10-CM | POA: Diagnosis not present

## 2014-06-08 ENCOUNTER — Ambulatory Visit (INDEPENDENT_AMBULATORY_CARE_PROVIDER_SITE_OTHER): Payer: Medicare Other | Admitting: Orthopedic Surgery

## 2014-06-08 ENCOUNTER — Encounter: Payer: Self-pay | Admitting: Orthopedic Surgery

## 2014-06-08 VITALS — BP 125/55 | Ht 66.0 in | Wt 179.2 lb

## 2014-06-08 DIAGNOSIS — M1990 Unspecified osteoarthritis, unspecified site: Secondary | ICD-10-CM | POA: Diagnosis not present

## 2014-06-08 DIAGNOSIS — M48061 Spinal stenosis, lumbar region without neurogenic claudication: Secondary | ICD-10-CM

## 2014-06-08 DIAGNOSIS — M25552 Pain in left hip: Secondary | ICD-10-CM | POA: Diagnosis not present

## 2014-06-08 DIAGNOSIS — M5416 Radiculopathy, lumbar region: Secondary | ICD-10-CM | POA: Diagnosis not present

## 2014-06-08 DIAGNOSIS — I10 Essential (primary) hypertension: Secondary | ICD-10-CM | POA: Diagnosis not present

## 2014-06-08 DIAGNOSIS — Z87440 Personal history of urinary (tract) infections: Secondary | ICD-10-CM | POA: Diagnosis not present

## 2014-06-08 DIAGNOSIS — L89152 Pressure ulcer of sacral region, stage 2: Secondary | ICD-10-CM | POA: Diagnosis not present

## 2014-06-08 DIAGNOSIS — M5137 Other intervertebral disc degeneration, lumbosacral region: Secondary | ICD-10-CM | POA: Diagnosis not present

## 2014-06-08 DIAGNOSIS — M4806 Spinal stenosis, lumbar region: Secondary | ICD-10-CM

## 2014-06-08 DIAGNOSIS — M545 Low back pain: Secondary | ICD-10-CM | POA: Diagnosis not present

## 2014-06-08 DIAGNOSIS — R32 Unspecified urinary incontinence: Secondary | ICD-10-CM | POA: Diagnosis not present

## 2014-06-08 DIAGNOSIS — M6281 Muscle weakness (generalized): Secondary | ICD-10-CM | POA: Diagnosis not present

## 2014-06-08 NOTE — Progress Notes (Signed)
Patient ID: [name] [name], female   DOB: 08/13/1922, 80 y.o.   MRN: [id number] Encounter Diagnoses  Name Primary?  . Low back pain, unspecified back pain laterality, with sciatica presence unspecified Yes  . DDD (degenerative disc disease), lumbosacral   . Hip pain, left   . Spinal stenosis of lumbar region with radiculopathy    Also has had a right total knee in the left total hip  She is coming back for follow-up after her epidural steroid injections at which time she got excellent relief. She is now walking better she's receiving physical therapy she came in today on her walker but she is essentially pain-free related to her back  She will follow-up for annual joint replacement x-rays.

## 2014-06-12 DIAGNOSIS — L89152 Pressure ulcer of sacral region, stage 2: Secondary | ICD-10-CM | POA: Diagnosis not present

## 2014-06-12 DIAGNOSIS — R32 Unspecified urinary incontinence: Secondary | ICD-10-CM | POA: Diagnosis not present

## 2014-06-12 DIAGNOSIS — M1990 Unspecified osteoarthritis, unspecified site: Secondary | ICD-10-CM | POA: Diagnosis not present

## 2014-06-12 DIAGNOSIS — M6281 Muscle weakness (generalized): Secondary | ICD-10-CM | POA: Diagnosis not present

## 2014-06-12 DIAGNOSIS — Z87440 Personal history of urinary (tract) infections: Secondary | ICD-10-CM | POA: Diagnosis not present

## 2014-06-12 DIAGNOSIS — I10 Essential (primary) hypertension: Secondary | ICD-10-CM | POA: Diagnosis not present

## 2014-06-13 DIAGNOSIS — Z87440 Personal history of urinary (tract) infections: Secondary | ICD-10-CM | POA: Diagnosis not present

## 2014-06-13 DIAGNOSIS — L89152 Pressure ulcer of sacral region, stage 2: Secondary | ICD-10-CM | POA: Diagnosis not present

## 2014-06-13 DIAGNOSIS — R32 Unspecified urinary incontinence: Secondary | ICD-10-CM | POA: Diagnosis not present

## 2014-06-13 DIAGNOSIS — M6281 Muscle weakness (generalized): Secondary | ICD-10-CM | POA: Diagnosis not present

## 2014-06-13 DIAGNOSIS — I10 Essential (primary) hypertension: Secondary | ICD-10-CM | POA: Diagnosis not present

## 2014-06-13 DIAGNOSIS — M1990 Unspecified osteoarthritis, unspecified site: Secondary | ICD-10-CM | POA: Diagnosis not present

## 2014-06-15 DIAGNOSIS — I10 Essential (primary) hypertension: Secondary | ICD-10-CM | POA: Diagnosis not present

## 2014-06-15 DIAGNOSIS — R32 Unspecified urinary incontinence: Secondary | ICD-10-CM | POA: Diagnosis not present

## 2014-06-15 DIAGNOSIS — M1990 Unspecified osteoarthritis, unspecified site: Secondary | ICD-10-CM | POA: Diagnosis not present

## 2014-06-15 DIAGNOSIS — L89152 Pressure ulcer of sacral region, stage 2: Secondary | ICD-10-CM | POA: Diagnosis not present

## 2014-06-15 DIAGNOSIS — M6281 Muscle weakness (generalized): Secondary | ICD-10-CM | POA: Diagnosis not present

## 2014-06-15 DIAGNOSIS — Z87440 Personal history of urinary (tract) infections: Secondary | ICD-10-CM | POA: Diagnosis not present

## 2014-06-26 ENCOUNTER — Other Ambulatory Visit: Payer: Self-pay

## 2014-06-26 MED ORDER — PANTOPRAZOLE SODIUM 40 MG PO TBEC: 40.0000 mg | DELAYED_RELEASE_TABLET | Freq: Two times a day (BID) | ORAL | Status: DC

## 2014-06-27 ENCOUNTER — Other Ambulatory Visit: Payer: Self-pay | Admitting: *Deleted

## 2014-06-27 DIAGNOSIS — N390 Urinary tract infection, site not specified: Secondary | ICD-10-CM | POA: Diagnosis not present

## 2014-06-27 DIAGNOSIS — E6609 Other obesity due to excess calories: Secondary | ICD-10-CM | POA: Diagnosis not present

## 2014-06-27 DIAGNOSIS — Z6833 Body mass index (BMI) 33.0-33.9, adult: Secondary | ICD-10-CM | POA: Diagnosis not present

## 2014-06-27 DIAGNOSIS — M25552 Pain in left hip: Secondary | ICD-10-CM

## 2014-06-27 DIAGNOSIS — H6123 Impacted cerumen, bilateral: Secondary | ICD-10-CM | POA: Diagnosis not present

## 2014-06-27 DIAGNOSIS — M5416 Radiculopathy, lumbar region: Secondary | ICD-10-CM

## 2014-06-27 DIAGNOSIS — M48061 Spinal stenosis, lumbar region without neurogenic claudication: Secondary | ICD-10-CM

## 2014-06-27 MED ORDER — GABAPENTIN 100 MG PO CAPS: 200.0000 mg | ORAL_CAPSULE | Freq: Three times a day (TID) | ORAL | Status: DC

## 2014-07-04 DIAGNOSIS — H6123 Impacted cerumen, bilateral: Secondary | ICD-10-CM | POA: Diagnosis not present

## 2014-07-12 DIAGNOSIS — G894 Chronic pain syndrome: Secondary | ICD-10-CM | POA: Diagnosis not present

## 2014-07-12 DIAGNOSIS — M545 Low back pain: Secondary | ICD-10-CM | POA: Diagnosis not present

## 2014-07-12 DIAGNOSIS — M5117 Intervertebral disc disorders with radiculopathy, lumbosacral region: Secondary | ICD-10-CM | POA: Diagnosis not present

## 2014-07-12 DIAGNOSIS — M4806 Spinal stenosis, lumbar region: Secondary | ICD-10-CM | POA: Diagnosis not present

## 2014-07-12 DIAGNOSIS — M5417 Radiculopathy, lumbosacral region: Secondary | ICD-10-CM | POA: Diagnosis not present

## 2014-07-26 ENCOUNTER — Other Ambulatory Visit (HOSPITAL_COMMUNITY): Payer: Self-pay | Admitting: Family Medicine

## 2014-07-26 DIAGNOSIS — E6609 Other obesity due to excess calories: Secondary | ICD-10-CM | POA: Diagnosis not present

## 2014-07-26 DIAGNOSIS — Z6833 Body mass index (BMI) 33.0-33.9, adult: Secondary | ICD-10-CM | POA: Diagnosis not present

## 2014-07-26 DIAGNOSIS — G8310 Monoplegia of lower limb affecting unspecified side: Secondary | ICD-10-CM | POA: Diagnosis not present

## 2014-07-26 DIAGNOSIS — R42 Dizziness and giddiness: Secondary | ICD-10-CM

## 2014-07-26 DIAGNOSIS — M1990 Unspecified osteoarthritis, unspecified site: Secondary | ICD-10-CM | POA: Diagnosis not present

## 2014-07-31 ENCOUNTER — Ambulatory Visit (HOSPITAL_COMMUNITY)
Admission: RE | Admit: 2014-07-31 | Discharge: 2014-07-31 | Disposition: A | Discharge status: Home / Self Care | Payer: Medicare Other | Source: Ambulatory Visit | Attending: Family Medicine | Admitting: Family Medicine

## 2014-07-31 DIAGNOSIS — G8220 Paraplegia, unspecified: Secondary | ICD-10-CM | POA: Insufficient documentation

## 2014-07-31 DIAGNOSIS — R42 Dizziness and giddiness: Secondary | ICD-10-CM | POA: Insufficient documentation

## 2014-07-31 DIAGNOSIS — I6523 Occlusion and stenosis of bilateral carotid arteries: Secondary | ICD-10-CM | POA: Diagnosis not present

## 2014-07-31 DIAGNOSIS — G8310 Monoplegia of lower limb affecting unspecified side: Secondary | ICD-10-CM

## 2014-07-31 DIAGNOSIS — I771 Stricture of artery: Secondary | ICD-10-CM | POA: Diagnosis not present

## 2014-08-03 ENCOUNTER — Telehealth: Payer: Self-pay | Admitting: Orthopedic Surgery

## 2014-08-07 ENCOUNTER — Other Ambulatory Visit: Payer: Self-pay | Admitting: *Deleted

## 2014-08-07 MED ORDER — TRAMADOL HCL 50 MG PO TABS: 50.0000 mg | ORAL_TABLET | ORAL | Status: DC | PRN

## 2014-08-07 NOTE — Telephone Encounter (Signed)
PATIENT IS CALLING ASKING FOR A REFILL ON  HER traMADol-acetaminophen (ULTRACET) 37.5-325 MG per tablet PLEASE ADVISE?

## 2014-08-07 NOTE — Telephone Encounter (Signed)
PRESCRIPTION FAXED TO PHARMACY

## 2014-08-08 ENCOUNTER — Other Ambulatory Visit (HOSPITAL_COMMUNITY): Payer: Self-pay | Admitting: Family Medicine

## 2014-08-08 DIAGNOSIS — Z1231 Encounter for screening mammogram for malignant neoplasm of breast: Secondary | ICD-10-CM

## 2014-08-09 ENCOUNTER — Ambulatory Visit: Payer: Medicare Other | Admitting: Internal Medicine

## 2014-08-29 ENCOUNTER — Ambulatory Visit: Payer: Medicare Other | Admitting: Internal Medicine

## 2014-09-06 ENCOUNTER — Other Ambulatory Visit: Payer: Self-pay | Admitting: Urology

## 2014-09-06 DIAGNOSIS — D49519 Neoplasm of unspecified behavior of unspecified kidney: Secondary | ICD-10-CM

## 2014-09-11 ENCOUNTER — Ambulatory Visit (HOSPITAL_COMMUNITY)
Admission: RE | Admit: 2014-09-11 | Discharge: 2014-09-11 | Disposition: A | Discharge status: Home / Self Care | Payer: Medicare Other | Source: Ambulatory Visit | Attending: Family Medicine | Admitting: Family Medicine

## 2014-09-11 DIAGNOSIS — Z1231 Encounter for screening mammogram for malignant neoplasm of breast: Secondary | ICD-10-CM | POA: Insufficient documentation

## 2014-09-14 ENCOUNTER — Ambulatory Visit (INDEPENDENT_AMBULATORY_CARE_PROVIDER_SITE_OTHER): Payer: Medicare Other | Admitting: Internal Medicine

## 2014-09-14 ENCOUNTER — Encounter: Payer: Self-pay | Admitting: Internal Medicine

## 2014-09-14 VITALS — BP 140/68 | HR 63 | Ht 66.0 in | Wt 172.5 lb

## 2014-09-14 DIAGNOSIS — I4510 Unspecified right bundle-branch block: Secondary | ICD-10-CM | POA: Diagnosis not present

## 2014-09-14 DIAGNOSIS — I7121 Aneurysm of the ascending aorta, without rupture: Secondary | ICD-10-CM

## 2014-09-14 DIAGNOSIS — I712 Thoracic aortic aneurysm, without rupture: Secondary | ICD-10-CM | POA: Diagnosis not present

## 2014-09-14 DIAGNOSIS — I351 Nonrheumatic aortic (valve) insufficiency: Secondary | ICD-10-CM

## 2014-09-14 NOTE — Patient Instructions (Signed)
Your physician has requested that you have an echocardiogram. Echocardiography is a painless test that uses sound waves to create images of your heart. It provides your doctor with information about the size and shape of your heart and how well your heart's chambers and valves are working. This procedure takes approximately one hour. There are no restrictions for this procedure.  Your physician wants you to follow-up in: 1 year with Dr. Hilty. You will receive a reminder letter in the mail two months in advance. If you don't receive a letter, please call our office to schedule the follow-up appointment.  

## 2014-09-14 NOTE — Progress Notes (Signed)
OFFICE NOTE  Chief Complaint:  No complaints  Primary Care Physician: Rocky Morel, MD  HPI:  [name] [name]  is an 81 year old female who is here for a followup visit. At last visit actually she had had a fall in the Spring and twisted her ankle. She had a what sounds like syncopal episode in the middle of the night after going to the bathroom after urinating, stood up, turned quickly in the bathroom, had a little bit of dizziness and then fell but reports she does not remember the episode. It sounds like she may have had micturition syncope; however, the fact that there was some prodrome, arrhythmia or other type of event cannot entirely be ruled out. We are also following her of course for a history of ascending aortic aneurysm which measured 4.2 x 4.2 by CT exam in 2012 with a normal EF and mild aortic insufficiency seen on echo. She denies any chest pain or worsening shortness of breath. Recently she's been having more dizziness and was seen by her primary care provider. EKG was noted to be a right bundle branch block, which was considered new and she was sent here for evaluation. In fact I saw her last February and she was in a right bundle branch block at that time. Her symptoms include vertigo and associated nausea especially with change in position. She does have significant hearing loss with a hearing aid in her right ear, however she cannot hear very well despite that. It has been over a year since she's been evaluated by an audiologist.  I saw [name] [name] back today in the office. EKG shows sinus rhythm with PACs and right bundle branch block which is unchanged. She remains on Plavix as she was intolerant of aspirin for anticoagulation. Her last CT was in 2014 which showed an ascending aortic aneurysm measuring up to 4.4 cm. This was demonstrated to be about 4.3 cm on an echo. At this point given her age she's not sure whether she wants surgical repair of the aneurysm at any  point.  PMHx:  Past Medical History  Diagnosis Date  . History of blood clots     in leg  . History of knee surgery   . Mini stroke   . Hip fracture     hip surgery 2001  . Stomach ulcer     secondary to h.pylori, s/p treatment  . Thyroid condition   . Bladder infection     h/o  . Kidney infection     h/o  . Melanoma of thigh     left  . Aneurysm, thoracic aortic   . Dehydration     HISTORY   . S/P colonoscopy March 2010    RMR: friable anal canal hemorrhoids, hyperplastic ascending polyp, adenomatous descending polyp   . Fall at home 09/10/12  . Melanoma of skin 03/14/2013  . DCIS (ductal carcinoma in situ) of breast 03/14/2013    right breast  . Iron deficiency anemia, unspecified 03/14/2013    secondary to GI blood loss  . Ascending aortic aneurysm     CT in 2012 - 4.2x4.2cm  . Aortic insufficiency   . Venous stasis     edema  . Hypertension   . Hemorrhoids     Past Surgical History  Procedure Laterality Date  . Partial hysterectomy  1976  . Leg surgery    . Breast lumpectomy  1998  . Appendectomy  1942  . Cataract extraction, bilateral    .  Melanoma excision Left 08/2006    left leg excision  . Tonsillectomy    . Cholecystectomy    . Varicose vein surgery    . Esophagogastroduodenoscopy  06/05/11    small hiatal hernia; + H.PYLORI GASTRITIS, s/p 5 days of Pylera, unable to finish due to N/V  . Colonoscopy  06/05/11    pancolonic diverticulosis/ileal reosion/abnormal anorectal junction s/p biopsy: path for small intestine and Ti was benign with non-villous atrophy, rectal biopsy with prominent prolapse changes, no acute inflammation  . Corneal transplant Bilateral 2013  . Knee surgery Right 05/2006    total right  . Total hip arthroplasty Left 2001&2004     X 2 FOR LEFT HIP  . Esophagogastroduodenoscopy (egd) with esophageal dilation N/A 06/08/2013    [id number] hiatal hernia;  otherwise normal EGD s/p dilation  . Givens capsule study N/A 06/08/2013     Procedure: GIVENS CAPSULE STUDY;  Surgeon: Daneil Dolin, MD;  Location: [facility];  Service: Endoscopy;  Laterality: N/A;  . Esophagogastroduodenoscopy N/A 06/13/2013    Dr. Oneida Alar- see enteroscopy  . Enteroscopy N/A 06/13/2013    [id number] Gastritis/GI BLEED MOST LIKELY DUE TO DUODENAL ULCERS, AND ? AVMs  . Transthoracic echocardiogram  10/06/2012    EF 55-60%, mod eccentric hypertrophy, grade 2 diastolic dysfunction; mildly calcifed AV annulus with moderate regurg; aortic root mildly dilated; LA severely dailted; PA peak pressure 77mHg  . Nm myocar perf wall motion  03/27/2010    dipyridamole; small reversible basal to mid-septal defect (?artifact), post-stress EF 55%, low risk scan   . Hemorrhoid banding      FAMHx:  Family History  Problem Relation Age of Onset  . Colon cancer Neg Hx   . Breast cancer Daughter     also hyperlipidemia  . Breast cancer Daughter     also hyperlipidemia  . Asthma Mother   . Multiple sclerosis Child   . Hyperlipidemia Child     SOCHx:   reports that she quit smoking about 39 years ago. Her smoking use included Cigarettes. She has a 30 pack-year smoking history. She has never used smokeless tobacco. She reports that she does not drink alcohol or use illicit drugs.  ALLERGIES:  Allergies  Allergen Reactions  . Codeine Diarrhea and Nausea Only  . Iron Nausea And Vomiting    Oral iron causes nausea and vomiting  . Asa [Aspirin] Rash and Other (See Comments)    Petechiae   . Penicillins Rash    ROS: A comprehensive review of systems was negative except for: Ears, nose, mouth, throat, and face: positive for hearing loss  HOME MEDS: Current Outpatient Prescriptions  Medication Sig Dispense Refill  . clopidogrel (PLAVIX) 75 MG tablet Take 75 mg by mouth daily with breakfast.    . furosemide (LASIX) 20 MG tablet Take 1 tablet (20 mg total) by mouth daily. 30 tablet 1  . gabapentin (NEURONTIN) 100 MG capsule Take 2 capsules (200 mg total) by  mouth 3 (three) times daily. 180 capsule 2  . hydrocortisone (ANUSOL-HC) 2.5 % rectal cream Apply rectally 2 times daily 30 g 1  . Multiple Vitamins-Minerals (OPTI-VITAMINS PO) Take 1 tablet by mouth daily.    . ondansetron (ZOFRAN) 4 MG tablet Take 1 tablet (4 mg total) by mouth every 8 (eight) hours as needed for nausea or vomiting. 30 tablet 0  . pantoprazole (PROTONIX) 40 MG tablet Take 1 tablet (40 mg total) by mouth 2 (two) times daily. 60 tablet 5  . prednisoLONE  acetate (PRED FORTE) 1 % ophthalmic suspension Place 1 drop into both eyes 3 (three) times daily.    . psyllium (METAMUCIL) 58.6 % packet Take 1 packet by mouth 2 (two) times daily.    . sodium chloride (MURO 128) 2 % ophthalmic solution Place 1 drop into both eyes 3 (three) times daily.     . traMADol (ULTRAM) 50 MG tablet Take 1 tablet (50 mg total) by mouth every 4 (four) hours as needed. 120 tablet 2   No current facility-administered medications for this visit.    LABS/IMAGING: No results found for this or any previous visit (from the past 48 hour(s)). No results found.  VITALS: BP 140/68 mmHg  Pulse 63  Ht 5\' 6"  (1.676 m)  Wt 172 lb 8 oz (78.245 kg)  BMI 27.86 kg/m2  EXAM: General appearance: alert and no distress Neck: no adenopathy, no carotid bruit, no JVD, supple, symmetrical, trachea midline and thyroid not enlarged, symmetric, no tenderness/mass/nodules Lungs: clear to auscultation bilaterally Heart: regular rate and rhythm, S1, S2 normal, no murmur, click, rub or gallop Abdomen: soft, non-tender; bowel sounds normal; no masses,  no organomegaly and obese Extremities: extremities normal, atraumatic, 1+ bilateral edema Pulses: 2+ and symmetric Skin: Skin color, texture, turgor normal. No rashes or lesions Neurologic: Mental status: Alert, oriented, thought content appropriate HEENT: right hearing aid in place  EKG: Normal sinus rhythm at 63 with a right bundle branch block,  LAFB  ASSESSMENT: 1. Chronic right bundle-branch block & LAFB 2. Benign paroxysmal positional vertigo 3. Hearing loss 4. Dizziness and associated nausea 5. Stable thoracic aortic aneurysm 6. Mild aortic insufficiency 7. LE Edema  PLAN: 1.   Mrs. Poorbaugh is doing well except for some occasional shortness of breath and lower extremity edema. Her EKG remained stable. She's not had reassessment of her aortic aneurysm for couple of years. At her age she may not want to have any intervention performed. I think it be reasonable to consider an echocardiogram since it is less invasive and no radiation is involved. This should give [place] a reasonable look at the ascending aorta to determine whether this is further dilated as well as to reassess her aortic insufficiency. Otherwise plan to see her back annually.  Pixie Casino, MD, [facility] Attending Cardiologist [facility] 09/14/2014, 2:04 PM

## 2014-09-25 ENCOUNTER — Ambulatory Visit (HOSPITAL_COMMUNITY): Payer: Medicare Other

## 2014-10-03 DIAGNOSIS — N390 Urinary tract infection, site not specified: Secondary | ICD-10-CM | POA: Diagnosis not present

## 2014-10-05 ENCOUNTER — Ambulatory Visit (HOSPITAL_COMMUNITY)
Admission: RE | Admit: 2014-10-05 | Discharge: 2014-10-05 | Disposition: A | Discharge status: Home / Self Care | Payer: Medicare Other | Source: Ambulatory Visit | Attending: Internal Medicine | Admitting: Internal Medicine

## 2014-10-05 ENCOUNTER — Other Ambulatory Visit: Payer: Self-pay | Admitting: Orthopedic Surgery

## 2014-10-05 ENCOUNTER — Ambulatory Visit (HOSPITAL_COMMUNITY)
Admission: RE | Admit: 2014-10-05 | Discharge: 2014-10-05 | Disposition: A | Discharge status: Home / Self Care | Payer: Medicare Other | Source: Ambulatory Visit | Attending: Urology | Admitting: Urology

## 2014-10-05 DIAGNOSIS — I10 Essential (primary) hypertension: Secondary | ICD-10-CM | POA: Insufficient documentation

## 2014-10-05 DIAGNOSIS — I351 Nonrheumatic aortic (valve) insufficiency: Secondary | ICD-10-CM

## 2014-10-05 DIAGNOSIS — I350 Nonrheumatic aortic (valve) stenosis: Secondary | ICD-10-CM | POA: Insufficient documentation

## 2014-10-05 DIAGNOSIS — I712 Thoracic aortic aneurysm, without rupture: Secondary | ICD-10-CM

## 2014-10-05 DIAGNOSIS — D49519 Neoplasm of unspecified behavior of unspecified kidney: Secondary | ICD-10-CM

## 2014-10-05 DIAGNOSIS — N2889 Other specified disorders of kidney and ureter: Secondary | ICD-10-CM | POA: Diagnosis not present

## 2014-10-05 DIAGNOSIS — I7121 Aneurysm of the ascending aorta, without rupture: Secondary | ICD-10-CM

## 2014-10-05 NOTE — Progress Notes (Signed)
*  PRELIMINARY RESULTS* Echocardiogram 2D Echocardiogram has been performed.  Leavy Cella 10/05/2014, 12:22 PM

## 2014-10-09 ENCOUNTER — Other Ambulatory Visit: Payer: Self-pay | Admitting: *Deleted

## 2014-10-09 ENCOUNTER — Telehealth: Payer: Self-pay | Admitting: Orthopedic Surgery

## 2014-10-09 DIAGNOSIS — M5416 Radiculopathy, lumbar region: Secondary | ICD-10-CM

## 2014-10-09 DIAGNOSIS — M48061 Spinal stenosis, lumbar region without neurogenic claudication: Secondary | ICD-10-CM

## 2014-10-09 DIAGNOSIS — M25552 Pain in left hip: Secondary | ICD-10-CM

## 2014-10-09 MED ORDER — GABAPENTIN 100 MG PO CAPS: 200.0000 mg | ORAL_CAPSULE | Freq: Three times a day (TID) | ORAL | Status: DC

## 2014-10-09 NOTE — Telephone Encounter (Signed)
Med sent to pharmacy, called patient, no answer

## 2014-10-09 NOTE — Telephone Encounter (Signed)
yes

## 2014-10-09 NOTE — Telephone Encounter (Signed)
Okay to refill? 

## 2014-10-13 ENCOUNTER — Ambulatory Visit (INDEPENDENT_AMBULATORY_CARE_PROVIDER_SITE_OTHER): Payer: Medicare Other | Admitting: Urology

## 2014-10-13 DIAGNOSIS — D495 Neoplasm of unspecified behavior of other genitourinary organs: Secondary | ICD-10-CM | POA: Diagnosis not present

## 2014-11-01 DIAGNOSIS — M4806 Spinal stenosis, lumbar region: Secondary | ICD-10-CM | POA: Diagnosis not present

## 2014-11-01 DIAGNOSIS — G894 Chronic pain syndrome: Secondary | ICD-10-CM | POA: Diagnosis not present

## 2014-11-01 DIAGNOSIS — M5417 Radiculopathy, lumbosacral region: Secondary | ICD-10-CM | POA: Diagnosis not present

## 2014-11-07 ENCOUNTER — Other Ambulatory Visit: Payer: Self-pay | Admitting: Orthopedic Surgery

## 2014-11-13 DIAGNOSIS — N390 Urinary tract infection, site not specified: Secondary | ICD-10-CM | POA: Diagnosis not present

## 2014-11-22 DIAGNOSIS — N390 Urinary tract infection, site not specified: Secondary | ICD-10-CM | POA: Diagnosis not present

## 2014-11-23 DIAGNOSIS — R827 Abnormal findings on microbiological examination of urine: Secondary | ICD-10-CM | POA: Diagnosis not present

## 2014-12-18 ENCOUNTER — Emergency Department (HOSPITAL_COMMUNITY): Payer: Medicare Other

## 2014-12-18 ENCOUNTER — Emergency Department (HOSPITAL_COMMUNITY)
Admission: EM | Admit: 2014-12-18 | Discharge: 2014-12-18 | Disposition: A | Discharge status: Home / Self Care | Payer: Medicare Other | Attending: Emergency Medicine | Admitting: Emergency Medicine

## 2014-12-18 ENCOUNTER — Encounter (HOSPITAL_COMMUNITY): Payer: Self-pay | Admitting: Emergency Medicine

## 2014-12-18 DIAGNOSIS — I10 Essential (primary) hypertension: Secondary | ICD-10-CM | POA: Diagnosis not present

## 2014-12-18 DIAGNOSIS — Z87891 Personal history of nicotine dependence: Secondary | ICD-10-CM | POA: Diagnosis not present

## 2014-12-18 DIAGNOSIS — Z8673 Personal history of transient ischemic attack (TIA), and cerebral infarction without residual deficits: Secondary | ICD-10-CM | POA: Insufficient documentation

## 2014-12-18 DIAGNOSIS — K219 Gastro-esophageal reflux disease without esophagitis: Secondary | ICD-10-CM | POA: Diagnosis not present

## 2014-12-18 DIAGNOSIS — Z9181 History of falling: Secondary | ICD-10-CM | POA: Diagnosis not present

## 2014-12-18 DIAGNOSIS — Z79899 Other long term (current) drug therapy: Secondary | ICD-10-CM | POA: Insufficient documentation

## 2014-12-18 DIAGNOSIS — Z86718 Personal history of other venous thrombosis and embolism: Secondary | ICD-10-CM | POA: Diagnosis not present

## 2014-12-18 DIAGNOSIS — Z8639 Personal history of other endocrine, nutritional and metabolic disease: Secondary | ICD-10-CM | POA: Insufficient documentation

## 2014-12-18 DIAGNOSIS — Z7952 Long term (current) use of systemic steroids: Secondary | ICD-10-CM | POA: Diagnosis not present

## 2014-12-18 DIAGNOSIS — Z87448 Personal history of other diseases of urinary system: Secondary | ICD-10-CM | POA: Diagnosis not present

## 2014-12-18 DIAGNOSIS — Z9889 Other specified postprocedural states: Secondary | ICD-10-CM | POA: Diagnosis not present

## 2014-12-18 DIAGNOSIS — Z880 Allergy status to penicillin: Secondary | ICD-10-CM | POA: Insufficient documentation

## 2014-12-18 DIAGNOSIS — Z86008 Personal history of in-situ neoplasm of other site: Secondary | ICD-10-CM | POA: Insufficient documentation

## 2014-12-18 DIAGNOSIS — Z8781 Personal history of (healed) traumatic fracture: Secondary | ICD-10-CM | POA: Diagnosis not present

## 2014-12-18 DIAGNOSIS — Z85820 Personal history of malignant melanoma of skin: Secondary | ICD-10-CM | POA: Insufficient documentation

## 2014-12-18 DIAGNOSIS — Z862 Personal history of diseases of the blood and blood-forming organs and certain disorders involving the immune mechanism: Secondary | ICD-10-CM | POA: Diagnosis not present

## 2014-12-18 DIAGNOSIS — Z8589 Personal history of malignant neoplasm of other organs and systems: Secondary | ICD-10-CM | POA: Insufficient documentation

## 2014-12-18 DIAGNOSIS — R079 Chest pain, unspecified: Secondary | ICD-10-CM | POA: Diagnosis not present

## 2014-12-18 DIAGNOSIS — R197 Diarrhea, unspecified: Secondary | ICD-10-CM | POA: Insufficient documentation

## 2014-12-18 LAB — TROPONIN I: Troponin I: <0.03 ng/mL (ref <0.031)

## 2014-12-18 LAB — CBC
HCT: 37.5 % (ref 36.0–46.0)
Hemoglobin: 12 g/dL (ref 12.0–15.0)
MCH: 29.6 pg (ref 26.0–34.0)
MCHC: 32 g/dL (ref 30.0–36.0)
MCV: 92.4 fL (ref 78.0–100.0)
PLATELETS: 166 10*3/uL (ref 150–400)
RBC: 4.06 MIL/uL (ref 3.87–5.11)
RDW: 14.1 % (ref 11.5–15.5)
WBC: 4.7 10*3/uL (ref 4.0–10.5)

## 2014-12-18 LAB — BASIC METABOLIC PANEL
ANION GAP: 7 (ref 5–15)
BUN: 16 mg/dL (ref 6–20)
CO2: 29 mmol/L (ref 22–32)
CREATININE: 0.94 mg/dL (ref 0.44–1.00)
Calcium: 8.6 mg/dL — ABNORMAL LOW (ref 8.9–10.3)
Chloride: 105 mmol/L (ref 101–111)
GFR calc non Af Amer: 52 mL/min — ABNORMAL LOW (ref >60)
Glucose, Bld: 107 mg/dL — ABNORMAL HIGH (ref 65–99)
Potassium: 3.6 mmol/L (ref 3.5–5.1)
Sodium: 141 mmol/L (ref 135–145)

## 2014-12-18 LAB — URINALYSIS, ROUTINE W REFLEX MICROSCOPIC
Bilirubin Urine: NEGATIVE
Glucose, UA: NEGATIVE mg/dL
Ketones, ur: NEGATIVE mg/dL
LEUKOCYTES UA: NEGATIVE
Nitrite: NEGATIVE
Protein, ur: NEGATIVE mg/dL
SPECIFIC GRAVITY, URINE: 1.01 (ref 1.005–1.030)
Urobilinogen, UA: 1 mg/dL (ref 0.0–1.0)
pH: 7.5 (ref 5.0–8.0)

## 2014-12-18 LAB — PROTIME-INR
INR: 1.08 (ref 0.00–1.49)
PROTHROMBIN TIME: 14.1 s (ref 11.6–15.2)

## 2014-12-18 LAB — URINE MICROSCOPIC-ADD ON

## 2014-12-18 LAB — BRAIN NATRIURETIC PEPTIDE: B NATRIURETIC PEPTIDE 5: 420 pg/mL — AB (ref 0.0–100.0)

## 2014-12-18 NOTE — ED Provider Notes (Signed)
CSN: [id number]     Arrival date & time 12/18/14  1542 History  This chart was scribed for Leonard Schwartz, MD by Pricilla Loveless, ED Scribe. This patient was seen in room APA09/[facility]09 and the patient's care was started at 8:42 PM.     Chief Complaint  Patient presents with  . Chest Pain  The history is provided by the patient. No language interpreter was used.    HPI Comments:   Expand All Collapse All   Pt states that she started having chest pain this morning with some generalized weakness and sob with exertion.         Past Medical History  Diagnosis Date  . History of blood clots     in leg  . History of knee surgery   . Mini stroke   . Hip fracture     hip surgery 2001  . Stomach ulcer     secondary to h.pylori, s/p treatment  . Thyroid condition   . Bladder infection     h/o  . Kidney infection     h/o  . Melanoma of thigh     left  . Aneurysm, thoracic aortic   . Dehydration     HISTORY   . S/P colonoscopy March 2010    RMR: friable anal canal hemorrhoids, hyperplastic ascending polyp, adenomatous descending polyp   . Fall at home 09/10/12  . Melanoma of skin 03/14/2013  . DCIS (ductal carcinoma in situ) of breast 03/14/2013    right breast  . Iron deficiency anemia, unspecified 03/14/2013    secondary to GI blood loss  . Ascending aortic aneurysm     CT in 2012 - 4.2x4.2cm  . Aortic insufficiency   . Venous stasis     edema  . Hypertension   . Hemorrhoids    Past Surgical History  Procedure Laterality Date  . Partial hysterectomy  1976  . Leg surgery    . Breast lumpectomy  1998  . Appendectomy  1942  . Cataract extraction, bilateral    . Melanoma excision Left 08/2006    left leg excision  . Tonsillectomy    . Cholecystectomy    . Varicose vein surgery    . Esophagogastroduodenoscopy  06/05/11    small hiatal hernia; + H.PYLORI GASTRITIS, s/p 5 days of Pylera, unable to finish due to N/V  . Colonoscopy  06/05/11    pancolonic diverticulosis/ileal  reosion/abnormal anorectal junction s/p biopsy: path for small intestine and Ti was benign with non-villous atrophy, rectal biopsy with prominent prolapse changes, no acute inflammation  . Corneal transplant Bilateral 2013  . Knee surgery Right 05/2006    total right  . Total hip arthroplasty Left 2001&2004     X 2 FOR LEFT HIP  . Esophagogastroduodenoscopy (egd) with esophageal dilation N/A 06/08/2013    [id number] hiatal hernia;  otherwise normal EGD s/p dilation  . Givens capsule study N/A 06/08/2013    Procedure: GIVENS CAPSULE STUDY;  Surgeon: Daneil Dolin, MD;  Location: [facility];  Service: Endoscopy;  Laterality: N/A;  . Esophagogastroduodenoscopy N/A 06/13/2013    Dr. Oneida Alar- see enteroscopy  . Enteroscopy N/A 06/13/2013    [place]:[id number] Gastritis/GI BLEED MOST LIKELY DUE TO DUODENAL ULCERS, AND ? AVMs  . Transthoracic echocardiogram  10/06/2012    EF 55-60%, mod eccentric hypertrophy, grade 2 diastolic dysfunction; mildly calcifed AV annulus with moderate regurg; aortic root mildly dilated; LA severely dailted; PA peak pressure 67mHg  . Nm myocar perf wall motion  03/27/2010    dipyridamole; small reversible basal to mid-septal defect (?artifact), post-stress EF 55%, low risk scan   . Hemorrhoid banding     Family History  Problem Relation Age of Onset  . Colon cancer Neg Hx   . Breast cancer Daughter     also hyperlipidemia  . Breast cancer Daughter     also hyperlipidemia  . Asthma Mother   . Multiple sclerosis Child   . Hyperlipidemia Child    History  Substance Use Topics  . Smoking status: Former Smoker -- 1.50 packs/day for 20 years    Types: Cigarettes    Quit date: 07/05/1975  . Smokeless tobacco: Never Used     Comment: Quit smoking in 1975  . Alcohol Use: No   OB History    No data available     Review of Systems  Gastrointestinal: Positive for diarrhea. Negative for nausea.  All other systems reviewed and are negative.     Allergies  Codeine; Iron;  Asa; and Penicillins  Home Medications   Prior to Admission medications   Medication Sig Start Date End Date Taking? Authorizing Provider  clopidogrel (PLAVIX) 75 MG tablet Take 75 mg by mouth daily with breakfast.   Yes Historical Provider, MD  furosemide (LASIX) 20 MG tablet Take 1 tablet (20 mg total) by mouth daily. 03/13/14  Yes Nat Christen, MD  gabapentin (NEURONTIN) 100 MG capsule Take 2 capsules (200 mg total) by mouth 3 (three) times daily. 10/09/14  Yes Carole Civil, MD  hydrocortisone (ANUSOL-HC) 2.5 % rectal cream Apply rectally 2 times daily Patient taking differently: Place 1 application rectally 2 (two) times daily as needed for hemorrhoids or itching. Apply rectally 2 times daily 04/05/14  Yes Veryl Speak, MD  Multiple Vitamins-Minerals (OPTI-VITAMINS PO) Take 1 tablet by mouth daily.   Yes Historical Provider, MD  ondansetron (ZOFRAN) 4 MG tablet Take 1 tablet (4 mg total) by mouth every 8 (eight) hours as needed for nausea or vomiting. Patient taking differently: Take 4 mg by mouth every morning. *MAY TAKE AS NEEDED DAILY BUT TAKES ONE TABLET IN THE MORNING 09/13/13  Yes Mahala Menghini, PA-C  pantoprazole (PROTONIX) 40 MG tablet Take 1 tablet (40 mg total) by mouth 2 (two) times daily. 06/26/14  Yes Orvil Feil, NP  prednisoLONE acetate (PRED FORTE) 1 % ophthalmic suspension Place 1 drop into both eyes 3 (three) times daily. 02/11/14  Yes Historical Provider, MD  psyllium (METAMUCIL) 58.6 % packet Take 1 packet by mouth 2 (two) times daily.   Yes Historical Provider, MD  sodium chloride (MURO 128) 2 % ophthalmic solution Place 1 drop into both eyes 3 (three) times daily.    Yes Historical Provider, MD  traMADol (ULTRAM) 50 MG tablet Take 1 tablet (50 mg total) by mouth every 4 (four) hours as needed. 08/07/14  Yes Carole Civil, MD   BP 173/60 mmHg  Pulse 60  Temp(Src) 97.9 F (36.6 C) (Oral)  Resp 20  Ht 5\' 6"  (1.676 m)  Wt 172 lb (78.019 kg)  BMI 27.77 kg/m2  SpO2  95% Physical Exam  Constitutional: She is oriented to person, place, and time. She appears well-developed and well-nourished. No distress.  HENT:  Head: Normocephalic and atraumatic.  Eyes: Pupils are equal, round, and reactive to light.  Neck: Normal range of motion.  Cardiovascular: Normal rate and intact distal pulses.   Pulmonary/Chest: No respiratory distress.  Abdominal: Normal appearance. She exhibits no distension. There is no  tenderness. There is no rebound.  Musculoskeletal: Normal range of motion.  Neurological: She is alert and oriented to person, place, and time. No cranial nerve deficit.  Skin: Skin is warm and dry. No rash noted.  Psychiatric: She has a normal mood and affect. Her behavior is normal.  Nursing note and vitals reviewed.   ED Course  Procedures (including critical care time) DIAGNOSTIC STUDIES: Oxygen Saturation is 94% on RA, Normal by my interpretation.    COORDINATION OF CARE: 8:42 PM Discussed treatment plan at bedside. Pt agreed to plan.   Labs Review Labs Reviewed  BASIC METABOLIC PANEL - Abnormal; Notable for the following:    Glucose, Bld 107 (*)    Calcium 8.6 (*)    GFR calc non Af Amer 52 (*)    All other components within normal limits  BRAIN NATRIURETIC PEPTIDE - Abnormal; Notable for the following:    B Natriuretic Peptide 420.0 (*)    All other components within normal limits  URINALYSIS, ROUTINE W REFLEX MICROSCOPIC - Abnormal; Notable for the following:    Hgb urine dipstick SMALL (*)    All other components within normal limits  URINE MICROSCOPIC-ADD ON - Abnormal; Notable for the following:    Bacteria, UA FEW (*)    All other components within normal limits  CBC  TROPONIN I  PROTIME-INR    Imaging Review Dg Chest 2 View  12/18/2014   CLINICAL DATA:  Left-sided chest pain for 1 day  EXAM: CHEST  2 VIEW  COMPARISON:  03/13/2014  FINDINGS: Cardiac shadow remains enlarged. Diffuse interstitial changes are again identified  throughout both lungs without focal infiltrate or sizable effusion. No acute bony abnormality is seen.  IMPRESSION: No acute abnormality noted.   Electronically Signed   By: Inez Catalina M.D.   On: 12/18/2014 16:11     EKG Interpretation   Date/Time:  Monday Dec 18 2014 15:45:44 EDT Ventricular Rate:  66 PR Interval:  152 QRS Duration: 148 QT Interval:  490 QTC Calculation: 513 R Axis:   -44 Text Interpretation:  Normal sinus rhythm Left axis deviation Right bundle  branch block Septal infarct , age undetermined Abnormal ECG No significant  change since last tracing Confirmed by Makaylia Hewett  MD, Yukie Bergeron [phone]) on  12/18/2014 7:19:30 PM     Patient and family were given the option of being admitted.  They discussed and decided they want to go home.  Instructed to return should pain worsen or persist or should she have new symptoms.  Follow up with primary care doctor encouraged. MDM   Final diagnoses:  Gastroesophageal reflux disease without esophagitis      Leonard Schwartz, MD 12/18/14 2043

## 2014-12-18 NOTE — ED Notes (Signed)
Pt states that she started having chest pain this morning with some generalized weakness and sob with exertion.

## 2014-12-18 NOTE — Discharge Instructions (Signed)
Gastroesophageal Reflux Disease, Adult °Gastroesophageal reflux disease (GERD) happens when acid from your stomach goes into your food pipe (esophagus). The acid can cause a burning feeling in your chest. Over time, the acid can make small holes (ulcers) in your food pipe.  °HOME CARE °· Ask your doctor for advice about: °¨ Losing weight. °¨ Quitting smoking. °¨ Alcohol use. °· Avoid foods and drinks that make your problems worse. You may want to avoid: °¨ Caffeine and alcohol. °¨ Chocolate. °¨ Mints. °¨ Garlic and onions. °¨ Spicy foods. °¨ Citrus fruits, such as oranges, lemons, or limes. °¨ Foods that contain tomato, such as sauce, chili, salsa, and pizza. °¨ Fried and fatty foods. °· Avoid lying down for 3 hours before you go to bed or before you take a nap. °· Eat small meals often, instead of large meals. °· Wear loose-fitting clothing. Do not wear anything tight around your waist. °· Raise (elevate) the head of your bed 6 to 8 inches with wood blocks. Using extra pillows does not help. °· Only take medicines as told by your doctor. °· Do not take aspirin or ibuprofen. °GET HELP RIGHT AWAY IF:  °· You have pain in your arms, neck, jaw, teeth, or back. °· Your pain gets worse or changes. °· You feel sick to your stomach (nauseous), throw up (vomit), or sweat (diaphoresis). °· You feel short of breath, or you pass out (faint). °· Your throw up is green, yellow, black, or looks like coffee grounds or blood. °· Your poop (stool) is red, bloody, or black. °MAKE SURE YOU:  °· Understand these instructions. °· Will watch your condition. °· Will get help right away if you are not doing well or get worse. °Document Released: 01/07/2008 Document Revised: 10/13/2011 Document Reviewed: 02/07/2011 °Exit[employer]® Patient Information ©2015 [employer]. This information is not intended to replace advice given to you by your health care provider. Make sure you discuss any questions you have with your health care provider. ° °

## 2014-12-19 ENCOUNTER — Ambulatory Visit: Payer: Medicare Other | Admitting: Orthopedic Surgery

## 2015-01-08 ENCOUNTER — Encounter: Payer: Self-pay | Admitting: Orthopedic Surgery

## 2015-01-08 ENCOUNTER — Ambulatory Visit (INDEPENDENT_AMBULATORY_CARE_PROVIDER_SITE_OTHER): Payer: Medicare Other

## 2015-01-08 ENCOUNTER — Ambulatory Visit (INDEPENDENT_AMBULATORY_CARE_PROVIDER_SITE_OTHER): Payer: Medicare Other | Admitting: Orthopedic Surgery

## 2015-01-08 VITALS — BP 134/59 | Ht 66.0 in | Wt 172.0 lb

## 2015-01-08 DIAGNOSIS — M25551 Pain in right hip: Secondary | ICD-10-CM | POA: Diagnosis not present

## 2015-01-08 MED ORDER — PREDNISONE 5 MG (21) PO TBPK: 5.0000 mg | ORAL_TABLET | Freq: Every day | ORAL | Status: DC

## 2015-01-08 NOTE — Patient Instructions (Addendum)
Please continue to take the gabapentin and the tramadol  We are also going to put you on a prednisone Dosepak 5 mg tapering dose pack for 12 days please take medicine as directed

## 2015-01-08 NOTE — Progress Notes (Signed)
Patient ID: [name] [name], female   DOB: June 24, 1923, 81 y.o.   MRN: [id number]  Patient ID: [name] [name], female   DOB: Oct 31, 1922, 81 y.o.   MRN: [id number]  Chief Complaint  Patient presents with  . Hip Pain    right sided low back pain with radicular leg pain + right hip pain    HPI [name] [name] is a 81 y.o. female.  Status post right total knee which I did on the left total hip which was done elsewhere presents with a catching sensation in the right leg and thigh complains of groin pain and that the leg is throwing her. She is on tramadol and gabapentin and she has 10 out of 10 constant pain. It's intermittent depends which way she moves her leg   Review of Systems Review of Systems Pertinent review of systems she has some nausea some excessive night urination no fever no chills back pain stiff joints denies numbness tingling or burning pain in her legs    Past Medical History  Diagnosis Date  . History of blood clots     in leg  . History of knee surgery   . Mini stroke   . Hip fracture     hip surgery 2001  . Stomach ulcer     secondary to h.pylori, s/p treatment  . Thyroid condition   . Bladder infection     h/o  . Kidney infection     h/o  . Melanoma of thigh     left  . Aneurysm, thoracic aortic   . Dehydration     HISTORY   . S/P colonoscopy March 2010    RMR: friable anal canal hemorrhoids, hyperplastic ascending polyp, adenomatous descending polyp   . Fall at home 09/10/12  . Melanoma of skin 03/14/2013  . DCIS (ductal carcinoma in situ) of breast 03/14/2013    right breast  . Iron deficiency anemia, unspecified 03/14/2013    secondary to GI blood loss  . Ascending aortic aneurysm     CT in 2012 - 4.2x4.2cm  . Aortic insufficiency   . Venous stasis     edema  . Hypertension   . Hemorrhoids     Past Surgical History  Procedure Laterality Date  . Partial hysterectomy  1976  . Leg surgery    . Breast lumpectomy  1998  . Appendectomy  1942  .  Cataract extraction, bilateral    . Melanoma excision Left 08/2006    left leg excision  . Tonsillectomy    . Cholecystectomy    . Varicose vein surgery    . Esophagogastroduodenoscopy  06/05/11    small hiatal hernia; + H.PYLORI GASTRITIS, s/p 5 days of Pylera, unable to finish due to N/V  . Colonoscopy  06/05/11    pancolonic diverticulosis/ileal reosion/abnormal anorectal junction s/p biopsy: path for small intestine and Ti was benign with non-villous atrophy, rectal biopsy with prominent prolapse changes, no acute inflammation  . Corneal transplant Bilateral 2013  . Knee surgery Right 05/2006    total right  . Total hip arthroplasty Left 2001&2004     X 2 FOR LEFT HIP  . Esophagogastroduodenoscopy (egd) with esophageal dilation N/A 06/08/2013    [id number] hiatal hernia;  otherwise normal EGD s/p dilation  . Givens capsule study N/A 06/08/2013    Procedure: GIVENS CAPSULE STUDY;  Surgeon: Daneil Dolin, MD;  Location: [facility];  Service: Endoscopy;  Laterality: N/A;  . Esophagogastroduodenoscopy N/A 06/13/2013  Dr. Oneida Alar- see enteroscopy  . Enteroscopy N/A 06/13/2013    [id number] Gastritis/GI BLEED MOST LIKELY DUE TO DUODENAL ULCERS, AND ? AVMs  . Transthoracic echocardiogram  10/06/2012    EF 55-60%, mod eccentric hypertrophy, grade 2 diastolic dysfunction; mildly calcifed AV annulus with moderate regurg; aortic root mildly dilated; LA severely dailted; PA peak pressure 51mHg  . Nm myocar perf wall motion  03/27/2010    dipyridamole; small reversible basal to mid-septal defect (?artifact), post-stress EF 55%, low risk scan   . Hemorrhoid banding      Social History History  Substance Use Topics  . Smoking status: Former Smoker -- 1.50 packs/day for 20 years    Types: Cigarettes    Quit date: 07/05/1975  . Smokeless tobacco: Never Used     Comment: Quit smoking in 1975  . Alcohol Use: No    Allergies  Allergen Reactions  . Codeine Diarrhea and Nausea Only  . Iron Nausea  And Vomiting    Oral iron causes nausea and vomiting  . Asa [Aspirin] Rash and Other (See Comments)    Petechiae   . Penicillins Rash    Current Outpatient Prescriptions  Medication Sig Dispense Refill  . clopidogrel (PLAVIX) 75 MG tablet Take 75 mg by mouth daily with breakfast.    . furosemide (LASIX) 20 MG tablet Take 1 tablet (20 mg total) by mouth daily. 30 tablet 1  . gabapentin (NEURONTIN) 100 MG capsule Take 2 capsules (200 mg total) by mouth 3 (three) times daily. 180 capsule 2  . hydrocortisone (ANUSOL-HC) 2.5 % rectal cream Apply rectally 2 times daily (Patient taking differently: Place 1 application rectally 2 (two) times daily as needed for hemorrhoids or itching. Apply rectally 2 times daily) 30 g 1  . Multiple Vitamins-Minerals (OPTI-VITAMINS PO) Take 1 tablet by mouth daily.    . ondansetron (ZOFRAN) 4 MG tablet Take 1 tablet (4 mg total) by mouth every 8 (eight) hours as needed for nausea or vomiting. (Patient taking differently: Take 4 mg by mouth every morning. *MAY TAKE AS NEEDED DAILY BUT TAKES ONE TABLET IN THE MORNING) 30 tablet 0  . pantoprazole (PROTONIX) 40 MG tablet Take 1 tablet (40 mg total) by mouth 2 (two) times daily. 60 tablet 5  . prednisoLONE acetate (PRED FORTE) 1 % ophthalmic suspension Place 1 drop into both eyes 3 (three) times daily.    . psyllium (METAMUCIL) 58.6 % packet Take 1 packet by mouth 2 (two) times daily.    . sodium chloride (MURO 128) 2 % ophthalmic solution Place 1 drop into both eyes 3 (three) times daily.     . traMADol (ULTRAM) 50 MG tablet Take 1 tablet (50 mg total) by mouth every 4 (four) hours as needed. 120 tablet 2   No current facility-administered medications for this visit.      Physical Exam Physical Exam Blood pressure 134/59, height 5\' 6"  (1.676 m), weight 172 lb (78.019 kg).  Gen. appearance she looks younger than stated age no gross abnormality she does walk with a walker that's chronic nothing acute, I believe is  related to her long-standing back problems as noted The patient is alert and oriented person place and time Mood is normal affect is normal Ambulatory status walker  Exam of the both lower extremities And lumbar spine  She has tenderness in her lumbar spine as high up as L1 it continues through L5 includes a right buttock right SI joint. She has tenderness over the right groin. She  has pain with abduction of the hip and mild pain with internal rotation flexion of the hip but she has excellent flexion and internal rotation of the hip hip is stable muscle tone in her legs is normal  She does have significant varicose veins bilaterally has bilateral peripheral edema she has intact reflexes and normal pulses in both legs   Data Reviewed Previous hip film shows arthritis previous back films shows osteopenia mild degenerative disc disease  Assessment    These x-rays show mild to moderate right hip arthritis. It is unclear to me however what is really causing her pain at this point I think it's hip pathology but it's also remarkable that she has tenderness in her back    Plan    On the foot or on a Dosepak to go along with her tramadol and the gabapentin and I will see her back on the 20th if no improvement we will try an intra-articular injection of cortisone and lidocaine in the hip joint       Arther Abbott 01/08/2015, 9:56 AM

## 2015-01-15 DIAGNOSIS — H524 Presbyopia: Secondary | ICD-10-CM | POA: Diagnosis not present

## 2015-01-15 DIAGNOSIS — H52223 Regular astigmatism, bilateral: Secondary | ICD-10-CM | POA: Diagnosis not present

## 2015-01-15 DIAGNOSIS — H3530 Unspecified macular degeneration: Secondary | ICD-10-CM | POA: Diagnosis not present

## 2015-01-15 DIAGNOSIS — H5203 Hypermetropia, bilateral: Secondary | ICD-10-CM | POA: Diagnosis not present

## 2015-01-17 ENCOUNTER — Encounter: Payer: Self-pay | Admitting: Internal Medicine

## 2015-01-18 ENCOUNTER — Other Ambulatory Visit: Payer: Self-pay | Admitting: Orthopedic Surgery

## 2015-01-22 ENCOUNTER — Encounter: Payer: Self-pay | Admitting: Orthopedic Surgery

## 2015-01-22 ENCOUNTER — Ambulatory Visit (INDEPENDENT_AMBULATORY_CARE_PROVIDER_SITE_OTHER): Payer: Medicare Other | Admitting: Orthopedic Surgery

## 2015-01-22 VITALS — BP 128/57 | Ht 66.0 in | Wt 172.0 lb

## 2015-01-22 DIAGNOSIS — M1990 Unspecified osteoarthritis, unspecified site: Secondary | ICD-10-CM | POA: Diagnosis not present

## 2015-01-22 DIAGNOSIS — M25551 Pain in right hip: Secondary | ICD-10-CM

## 2015-01-22 DIAGNOSIS — M1611 Unilateral primary osteoarthritis, right hip: Secondary | ICD-10-CM

## 2015-01-22 DIAGNOSIS — M545 Low back pain: Secondary | ICD-10-CM

## 2015-01-22 NOTE — Progress Notes (Signed)
Chief Complaint  Patient presents with  . Follow-up    2 week follow up Right hip s/p meds    BP 128/57 mmHg  Ht 5\' 6"  (1.676 m)  Wt 172 lb (78.019 kg)  BMI 27.77 kg/m2  [name] [name] is 81 years old she had a right total knee replacement. She had a left total hip replacement done elsewhere. She presented with a catching sensation in the right leg and thigh with groin pain and pain radiating down her right leg. She was artery on tramadol and gabapentin  I put her on a Dosepak she improved except for the groin pain. This bothers her with hip flexion and with ambulation  She continues to deny numbness and tingling or burning pain in her legs just had pain dull aching sensation  At this point she's having groin pain swelling and a set her up for cortisone injection in the right hip and follow-up with me in 6 months  Encounter Diagnoses  Name Primary?  . Right hip pain   . Low back pain, unspecified back pain laterality, with sciatica presence unspecified   . Arthritis of right hip Yes    Low back pain with radicular pain is improved Hip arthritis not improved  Inject hip with arthrogram and Depo-Medrol lidocaine

## 2015-01-22 NOTE — Patient Instructions (Addendum)
We will schedule hip injection for you and call you with appointment

## 2015-01-23 ENCOUNTER — Other Ambulatory Visit: Payer: Self-pay | Admitting: *Deleted

## 2015-01-23 ENCOUNTER — Telehealth: Payer: Self-pay | Admitting: Orthopedic Surgery

## 2015-01-23 DIAGNOSIS — M48061 Spinal stenosis, lumbar region without neurogenic claudication: Secondary | ICD-10-CM

## 2015-01-23 DIAGNOSIS — M25552 Pain in left hip: Secondary | ICD-10-CM

## 2015-01-23 DIAGNOSIS — M5416 Radiculopathy, lumbar region: Secondary | ICD-10-CM

## 2015-01-23 MED ORDER — GABAPENTIN 100 MG PO CAPS: 200.0000 mg | ORAL_CAPSULE | Freq: Three times a day (TID) | ORAL | Status: DC

## 2015-01-23 NOTE — Telephone Encounter (Signed)
Med sent.

## 2015-01-23 NOTE — Telephone Encounter (Signed)
Patient is calling stating that [facility] didn't receive her gabapentin (NEURONTIN) 100 MG capsule please advise

## 2015-01-26 DIAGNOSIS — H01001 Unspecified blepharitis right upper eyelid: Secondary | ICD-10-CM | POA: Diagnosis not present

## 2015-01-26 DIAGNOSIS — H1131 Conjunctival hemorrhage, right eye: Secondary | ICD-10-CM | POA: Diagnosis not present

## 2015-01-30 ENCOUNTER — Ambulatory Visit (HOSPITAL_COMMUNITY)
Admission: RE | Admit: 2015-01-30 | Discharge: 2015-01-30 | Disposition: A | Discharge status: Home / Self Care | Payer: Medicare Other | Source: Ambulatory Visit | Attending: Orthopedic Surgery | Admitting: Orthopedic Surgery

## 2015-01-30 DIAGNOSIS — M25551 Pain in right hip: Secondary | ICD-10-CM | POA: Diagnosis not present

## 2015-01-30 MED ORDER — LIDOCAINE HCL (PF) 1 % IJ SOLN: 10.0000 mL | Freq: Once | INTRAMUSCULAR | Status: DC

## 2015-01-30 MED ORDER — POVIDONE-IODINE 10 % EX SOLN
CUTANEOUS | Status: AC
  Filled 2015-01-30: qty 15

## 2015-01-30 MED ORDER — METHYLPREDNISOLONE ACETATE 40 MG/ML IJ SUSP
INTRAMUSCULAR | Status: AC
Start: 2015-01-30 — End: 2015-01-30
  Filled 2015-01-30: qty 1

## 2015-01-30 MED ORDER — LIDOCAINE HCL (PF) 1 % IJ SOLN
INTRAMUSCULAR | Status: AC
Start: 2015-01-30 — End: 2015-01-30
  Filled 2015-01-30: qty 10

## 2015-01-30 MED ORDER — METHYLPREDNISOLONE ACETATE 40 MG/ML IJ SUSP
40.0000 mg | Freq: Once | INTRAMUSCULAR | Status: DC
  Filled 2015-01-30: qty 1

## 2015-01-30 MED ORDER — IOHEXOL 350 MG/ML SOLN
50.0000 mL | Freq: Once | INTRAVENOUS | Status: AC | PRN
  Administered 2015-01-30: 3 mL

## 2015-01-30 NOTE — Procedures (Signed)
Preprocedure Dx: RIGHT hip pain Postprocedure Dx: RIGHT hip pain Procedure  Fluoroscopically guided therapeutic RT hip joint injection Radiologist:  Thornton Papas Anesthesia:  3 ml of 1% lidocaine Injectate:  40 mg depomedrol, 5 ml 1% lidocaine, 3 ml Omni 300 Fluoro time:  1 minutes 30 seconds EBL:   < 1 ml Complications: None

## 2015-01-31 ENCOUNTER — Other Ambulatory Visit: Payer: Self-pay

## 2015-01-31 NOTE — Telephone Encounter (Signed)
Please touch base with patient. Per RMR's last note, pantoprazole once daily but RX request is for BID.  Find out what she is on, and if it is BID, we should try once daily.

## 2015-02-01 MED ORDER — PANTOPRAZOLE SODIUM 40 MG PO TBEC: 40.0000 mg | DELAYED_RELEASE_TABLET | Freq: Two times a day (BID) | ORAL | Status: DC

## 2015-02-01 NOTE — Telephone Encounter (Signed)
RF for BID X 3.  Needs OV at any time for one year follow up. Will address at that time, but RMR recommended trying once daily at [place] 02/2014.

## 2015-02-01 NOTE — Telephone Encounter (Signed)
Pt is taking the Protoinx BID

## 2015-02-09 ENCOUNTER — Ambulatory Visit (INDEPENDENT_AMBULATORY_CARE_PROVIDER_SITE_OTHER): Payer: Medicare Other | Admitting: Gastroenterology

## 2015-02-09 ENCOUNTER — Encounter: Payer: Self-pay | Admitting: Gastroenterology

## 2015-02-09 VITALS — BP 125/53 | HR 67 | Temp 97.0°F | Ht 65.0 in | Wt 181.8 lb

## 2015-02-09 DIAGNOSIS — K649 Unspecified hemorrhoids: Secondary | ICD-10-CM

## 2015-02-09 DIAGNOSIS — K5900 Constipation, unspecified: Secondary | ICD-10-CM | POA: Diagnosis not present

## 2015-02-09 MED ORDER — PANTOPRAZOLE SODIUM 40 MG PO TBEC: 40.0000 mg | DELAYED_RELEASE_TABLET | Freq: Two times a day (BID) | ORAL | Status: DC

## 2015-02-09 NOTE — Progress Notes (Signed)
cc'd to pcp 

## 2015-02-09 NOTE — Patient Instructions (Signed)
You may use the hemorrhoid cream for 7 days, then take a break.  Start taking Miralax 1 capful each evening to help with constipation. I don't want you straining at all. Continue Metamucil.  We will see you in 3 months!

## 2015-02-09 NOTE — Assessment & Plan Note (Signed)
81 year old female s/p banding last year, now with recurrent hematochezia in setting of constipation and straining. Needs to avoid straining and supportive treatment. Would not recommend surgical evaluation at her age. Rectal exam with multiple hemorrhoid tags but without obvious anal fissure or other abnormalities.   Miralax once each evening Anusol cream X 7 days Avoid straining  Return in 3 months

## 2015-02-09 NOTE — Progress Notes (Signed)
Referring Provider: Caren Macadam, MD Primary Care Physician:  Rocky Morel, MD Primary GI: Dr. Gala Romney   Chief Complaint  Patient presents with  . Follow-up    HPI:   [name] [name] is a 81 y.o. female presenting today with a history of GERD, hemorrhoids s/p banding in 2015, infrequent nausea. Routine 1 year follow-up.    Sometimes has to strain. Doesn't feel like stools are productive. Lower abdominal discomfort. About a month ago got off the commode and had bled a large puddle in the floor. None since then. No rectal discomfort. Has rectal cream but not using all the time but did start using the other day. Had a flare of reflux since last visit, went to ED, told it was reflux symptoms. Protonix BID. Veggie straws set her off possibly.     Past Medical History  Diagnosis Date  . History of blood clots     in leg  . History of knee surgery   . Mini stroke   . Hip fracture     hip surgery 2001  . Stomach ulcer     secondary to h.pylori, s/p treatment  . Thyroid condition   . Bladder infection     h/o  . Kidney infection     h/o  . Melanoma of thigh     left  . Aneurysm, thoracic aortic   . Dehydration     HISTORY   . S/P colonoscopy March 2010    RMR: friable anal canal hemorrhoids, hyperplastic ascending polyp, adenomatous descending polyp   . Fall at home 09/10/12  . Melanoma of skin 03/14/2013  . DCIS (ductal carcinoma in situ) of breast 03/14/2013    right breast  . Iron deficiency anemia, unspecified 03/14/2013    secondary to GI blood loss  . Ascending aortic aneurysm     CT in 2012 - 4.2x4.2cm  . Aortic insufficiency   . Venous stasis     edema  . Hypertension   . Hemorrhoids     Past Surgical History  Procedure Laterality Date  . Partial hysterectomy  1976  . Leg surgery    . Breast lumpectomy  1998  . Appendectomy  1942  . Cataract extraction, bilateral    . Melanoma excision Left 08/2006    left leg excision  . Tonsillectomy      . Cholecystectomy    . Varicose vein surgery    . Esophagogastroduodenoscopy  06/05/11    small hiatal hernia; + H.PYLORI GASTRITIS, s/p 5 days of Pylera, unable to finish due to N/V  . Colonoscopy  06/05/11    pancolonic diverticulosis/ileal reosion/abnormal anorectal junction s/p biopsy: path for small intestine and Ti was benign with non-villous atrophy, rectal biopsy with prominent prolapse changes, no acute inflammation  . Corneal transplant Bilateral 2013  . Knee surgery Right 05/2006    total right  . Total hip arthroplasty Left 2001&2004     X 2 FOR LEFT HIP  . Esophagogastroduodenoscopy (egd) with esophageal dilation N/A 06/08/2013    [id number] hiatal hernia;  otherwise normal EGD s/p dilation  . Givens capsule study N/A 06/08/2013    Procedure: GIVENS CAPSULE STUDY;  Surgeon: Daneil Dolin, MD;  Location: [facility];  Service: Endoscopy;  Laterality: N/A;  . Esophagogastroduodenoscopy N/A 06/13/2013    Dr. Oneida Alar- see enteroscopy  . Enteroscopy N/A 06/13/2013    [id number] Gastritis/GI BLEED MOST LIKELY DUE TO DUODENAL ULCERS, AND ? AVMs  . Transthoracic echocardiogram  10/06/2012    EF 55-60%, mod eccentric hypertrophy, grade 2 diastolic dysfunction; mildly calcifed AV annulus with moderate regurg; aortic root mildly dilated; LA severely dailted; PA peak pressure 15mHg  . Nm myocar perf wall motion  03/27/2010    dipyridamole; small reversible basal to mid-septal defect (?artifact), post-stress EF 55%, low risk scan   . Hemorrhoid banding      Current Outpatient Prescriptions  Medication Sig Dispense Refill  . clopidogrel (PLAVIX) 75 MG tablet Take 75 mg by mouth daily with breakfast.    . furosemide (LASIX) 20 MG tablet Take 1 tablet (20 mg total) by mouth daily. 30 tablet 1  . gabapentin (NEURONTIN) 100 MG capsule Take 2 capsules (200 mg total) by mouth 3 (three) times daily. 180 capsule 2  . hydrocortisone (ANUSOL-HC) 2.5 % rectal cream Apply rectally 2 times daily (Patient  taking differently: Place 1 application rectally 2 (two) times daily as needed for hemorrhoids or itching. Apply rectally 2 times daily) 30 g 1  . Multiple Vitamins-Minerals (OPTI-VITAMINS PO) Take 1 tablet by mouth daily.    . ondansetron (ZOFRAN) 4 MG tablet Take 1 tablet (4 mg total) by mouth every 8 (eight) hours as needed for nausea or vomiting. (Patient taking differently: Take 4 mg by mouth every morning. *MAY TAKE AS NEEDED DAILY BUT TAKES ONE TABLET IN THE MORNING) 30 tablet 0  . pantoprazole (PROTONIX) 40 MG tablet Take 1 tablet (40 mg total) by mouth 2 (two) times daily. 60 tablet 2  . prednisoLONE acetate (PRED FORTE) 1 % ophthalmic suspension Place 1 drop into both eyes 3 (three) times daily.    . predniSONE (STERAPRED UNI-PAK 21 TAB) 5 MG (21) TBPK tablet Take 1 tablet (5 mg total) by mouth daily. 5 mg single strength 12 day Dosepak hand written 48 tablet   . psyllium (METAMUCIL) 58.6 % packet Take 1 packet by mouth 2 (two) times daily.    . sodium chloride (MURO 128) 2 % ophthalmic solution Place 1 drop into both eyes 3 (three) times daily.     . traMADol (ULTRAM) 50 MG tablet Take 1 tablet (50 mg total) by mouth every 4 (four) hours as needed. 120 tablet 2   No current facility-administered medications for this visit.    Allergies as of 02/09/2015 - Review Complete 02/09/2015  Allergen Reaction Noted  . Codeine Diarrhea and Nausea Only   . Iron Nausea And Vomiting 02/01/2011  . Asa [aspirin] Rash and Other (See Comments) 07/27/2013  . Penicillins Rash     Family History  Problem Relation Age of Onset  . Colon cancer Neg Hx   . Breast cancer Daughter     also hyperlipidemia  . Breast cancer Daughter     also hyperlipidemia  . Asthma Mother   . Multiple sclerosis Child   . Hyperlipidemia Child     History   Social History  . Marital Status: Widowed    Spouse Name: N/A  . Number of Children: 5  . Years of Education: 6   Occupational History  .     Social  History Main Topics  . Smoking status: Former Smoker -- 1.50 packs/day for 20 years    Types: Cigarettes    Quit date: 07/05/1975  . Smokeless tobacco: Never Used     Comment: Quit smoking in 1975  . Alcohol Use: No  . Drug Use: No  . Sexual Activity: No   Other Topics Concern  . None   Social History Narrative  Review of Systems: As mentioned in HPI  Physical Exam: BP 125/53 mmHg  Pulse 67  Temp(Src) 97 F (36.1 C)  Ht 5\' 5"  (1.651 m)  Wt 181 lb 12.8 oz (82.464 kg)  BMI 30.25 kg/m2 General:   Alert and oriented. No distress noted. Pleasant and cooperative.  Head:  Normocephalic and atraumatic. Eyes:  Conjuctiva clear without scleral icterus. Abdomen:  +BS, soft, non-tender and non-distended. No rebound or guarding. No HSM or masses noted. Rectal: multiple external hemorrhoid tags but without thrombosed external hemorrhoids. No gross blood noted.  Neurologic:  Alert and  oriented x4;  grossly normal neurologically. Psych:  Alert and cooperative. Normal mood and affect.

## 2015-02-09 NOTE — Assessment & Plan Note (Signed)
Continue Metamucil. Add Miralax once each evening. 3 month return.

## 2015-02-19 ENCOUNTER — Other Ambulatory Visit: Payer: Self-pay | Admitting: *Deleted

## 2015-02-19 DIAGNOSIS — M48061 Spinal stenosis, lumbar region without neurogenic claudication: Secondary | ICD-10-CM

## 2015-02-19 DIAGNOSIS — E6609 Other obesity due to excess calories: Secondary | ICD-10-CM | POA: Diagnosis not present

## 2015-02-19 DIAGNOSIS — E785 Hyperlipidemia, unspecified: Secondary | ICD-10-CM | POA: Diagnosis not present

## 2015-02-19 DIAGNOSIS — N390 Urinary tract infection, site not specified: Secondary | ICD-10-CM | POA: Diagnosis not present

## 2015-02-19 DIAGNOSIS — Z6833 Body mass index (BMI) 33.0-33.9, adult: Secondary | ICD-10-CM | POA: Diagnosis not present

## 2015-02-19 DIAGNOSIS — M5416 Radiculopathy, lumbar region: Secondary | ICD-10-CM

## 2015-02-19 DIAGNOSIS — D649 Anemia, unspecified: Secondary | ICD-10-CM | POA: Diagnosis not present

## 2015-02-19 DIAGNOSIS — M25552 Pain in left hip: Secondary | ICD-10-CM

## 2015-02-19 DIAGNOSIS — Z1389 Encounter for screening for other disorder: Secondary | ICD-10-CM | POA: Diagnosis not present

## 2015-02-19 DIAGNOSIS — I10 Essential (primary) hypertension: Secondary | ICD-10-CM | POA: Diagnosis not present

## 2015-02-19 DIAGNOSIS — R531 Weakness: Secondary | ICD-10-CM | POA: Diagnosis not present

## 2015-02-19 MED ORDER — TRAMADOL HCL 50 MG PO TABS: 50.0000 mg | ORAL_TABLET | ORAL | Status: DC | PRN

## 2015-02-19 MED ORDER — GABAPENTIN 100 MG PO CAPS: 200.0000 mg | ORAL_CAPSULE | Freq: Three times a day (TID) | ORAL | Status: DC

## 2015-02-22 ENCOUNTER — Other Ambulatory Visit: Payer: Self-pay | Admitting: Orthopedic Surgery

## 2015-02-23 ENCOUNTER — Other Ambulatory Visit: Payer: Self-pay | Admitting: *Deleted

## 2015-02-23 MED ORDER — TRAMADOL HCL 50 MG PO TABS: 50.0000 mg | ORAL_TABLET | ORAL | Status: DC | PRN

## 2015-03-06 ENCOUNTER — Ambulatory Visit: Payer: Medicare Other | Admitting: Orthopedic Surgery

## 2015-03-13 ENCOUNTER — Ambulatory Visit (INDEPENDENT_AMBULATORY_CARE_PROVIDER_SITE_OTHER): Payer: Medicare Other | Admitting: Orthopedic Surgery

## 2015-03-13 ENCOUNTER — Encounter: Payer: Self-pay | Admitting: Orthopedic Surgery

## 2015-03-13 VITALS — BP 132/72 | Ht 65.0 in | Wt 169.0 lb

## 2015-03-13 DIAGNOSIS — M1611 Unilateral primary osteoarthritis, right hip: Secondary | ICD-10-CM

## 2015-03-13 DIAGNOSIS — M1990 Unspecified osteoarthritis, unspecified site: Secondary | ICD-10-CM

## 2015-03-13 NOTE — Progress Notes (Signed)
Patient ID: [name] [name], female   DOB: Dec 15, 1922, 81 y.o.   MRN: [id number]  Follow up visit  Chief Complaint  Patient presents with  . Follow-up    6 week follow up right hip s/p arthrogram joint injection    BP 132/72 mmHg  Ht 5\' 5"  (1.651 m)  Wt 169 lb (76.658 kg)  BMI 28.12 kg/m2  Encounter Diagnosis  Name Primary?  Marland Kitchen Arthritis of right hip Yes    Follow-up after injection right hip the patient got good relief from her groin pain although she still has some radicular symptoms from her lower back issues. We will continue tramadol and gabapentin 200 mg 3 times a day for that and she will follow-up with [place] as needed for any ongoing hip pain.

## 2015-04-04 DIAGNOSIS — J069 Acute upper respiratory infection, unspecified: Secondary | ICD-10-CM | POA: Diagnosis not present

## 2015-04-04 DIAGNOSIS — Z1389 Encounter for screening for other disorder: Secondary | ICD-10-CM | POA: Diagnosis not present

## 2015-04-04 DIAGNOSIS — E6609 Other obesity due to excess calories: Secondary | ICD-10-CM | POA: Diagnosis not present

## 2015-04-04 DIAGNOSIS — Z6833 Body mass index (BMI) 33.0-33.9, adult: Secondary | ICD-10-CM | POA: Diagnosis not present

## 2015-04-04 DIAGNOSIS — J209 Acute bronchitis, unspecified: Secondary | ICD-10-CM | POA: Diagnosis not present

## 2015-04-12 DIAGNOSIS — Z1389 Encounter for screening for other disorder: Secondary | ICD-10-CM | POA: Diagnosis not present

## 2015-04-12 DIAGNOSIS — J189 Pneumonia, unspecified organism: Secondary | ICD-10-CM | POA: Diagnosis not present

## 2015-04-12 DIAGNOSIS — Z6832 Body mass index (BMI) 32.0-32.9, adult: Secondary | ICD-10-CM | POA: Diagnosis not present

## 2015-04-12 DIAGNOSIS — E6609 Other obesity due to excess calories: Secondary | ICD-10-CM | POA: Diagnosis not present

## 2015-04-13 ENCOUNTER — Ambulatory Visit (HOSPITAL_COMMUNITY): Payer: Medicare Other | Admitting: Hematology & Oncology

## 2015-04-13 ENCOUNTER — Other Ambulatory Visit (HOSPITAL_COMMUNITY): Payer: Medicare Other

## 2015-04-21 ENCOUNTER — Emergency Department (HOSPITAL_COMMUNITY)
Admission: EM | Admit: 2015-04-21 | Discharge: 2015-04-21 | Disposition: A | Discharge status: Home / Self Care | Payer: Medicare Other | Attending: Emergency Medicine | Admitting: Emergency Medicine

## 2015-04-21 ENCOUNTER — Encounter (HOSPITAL_COMMUNITY): Payer: Self-pay | Admitting: Emergency Medicine

## 2015-04-21 ENCOUNTER — Emergency Department (HOSPITAL_COMMUNITY): Payer: Medicare Other

## 2015-04-21 DIAGNOSIS — Z8719 Personal history of other diseases of the digestive system: Secondary | ICD-10-CM | POA: Insufficient documentation

## 2015-04-21 DIAGNOSIS — Z862 Personal history of diseases of the blood and blood-forming organs and certain disorders involving the immune mechanism: Secondary | ICD-10-CM | POA: Insufficient documentation

## 2015-04-21 DIAGNOSIS — Z87891 Personal history of nicotine dependence: Secondary | ICD-10-CM | POA: Insufficient documentation

## 2015-04-21 DIAGNOSIS — Z79899 Other long term (current) drug therapy: Secondary | ICD-10-CM | POA: Insufficient documentation

## 2015-04-21 DIAGNOSIS — R079 Chest pain, unspecified: Secondary | ICD-10-CM | POA: Diagnosis present

## 2015-04-21 DIAGNOSIS — Z7902 Long term (current) use of antithrombotics/antiplatelets: Secondary | ICD-10-CM | POA: Insufficient documentation

## 2015-04-21 DIAGNOSIS — Z880 Allergy status to penicillin: Secondary | ICD-10-CM | POA: Insufficient documentation

## 2015-04-21 DIAGNOSIS — F419 Anxiety disorder, unspecified: Secondary | ICD-10-CM | POA: Diagnosis not present

## 2015-04-21 DIAGNOSIS — Z8639 Personal history of other endocrine, nutritional and metabolic disease: Secondary | ICD-10-CM | POA: Insufficient documentation

## 2015-04-21 DIAGNOSIS — Z87448 Personal history of other diseases of urinary system: Secondary | ICD-10-CM | POA: Insufficient documentation

## 2015-04-21 DIAGNOSIS — Z85820 Personal history of malignant melanoma of skin: Secondary | ICD-10-CM | POA: Insufficient documentation

## 2015-04-21 DIAGNOSIS — Z8673 Personal history of transient ischemic attack (TIA), and cerebral infarction without residual deficits: Secondary | ICD-10-CM | POA: Insufficient documentation

## 2015-04-21 DIAGNOSIS — R0602 Shortness of breath: Secondary | ICD-10-CM | POA: Diagnosis not present

## 2015-04-21 DIAGNOSIS — Z9889 Other specified postprocedural states: Secondary | ICD-10-CM | POA: Insufficient documentation

## 2015-04-21 DIAGNOSIS — R05 Cough: Secondary | ICD-10-CM | POA: Diagnosis not present

## 2015-04-21 DIAGNOSIS — I10 Essential (primary) hypertension: Secondary | ICD-10-CM | POA: Diagnosis not present

## 2015-04-21 DIAGNOSIS — R059 Cough, unspecified: Secondary | ICD-10-CM

## 2015-04-21 DIAGNOSIS — Z8781 Personal history of (healed) traumatic fracture: Secondary | ICD-10-CM | POA: Insufficient documentation

## 2015-04-21 LAB — CBC WITH DIFFERENTIAL/PLATELET
BASOS ABS: 0 10*3/uL (ref 0.0–0.1)
BASOS PCT: 1 %
Eosinophils Absolute: 0.1 10*3/uL (ref 0.0–0.7)
Eosinophils Relative: 2 %
HEMATOCRIT: 38.1 % (ref 36.0–46.0)
HEMOGLOBIN: 12.3 g/dL (ref 12.0–15.0)
Lymphocytes Relative: 28 %
Lymphs Abs: 1.3 10*3/uL (ref 0.7–4.0)
MCH: 29.4 pg (ref 26.0–34.0)
MCHC: 32.3 g/dL (ref 30.0–36.0)
MCV: 91.1 fL (ref 78.0–100.0)
MONOS PCT: 8 %
Monocytes Absolute: 0.4 10*3/uL (ref 0.1–1.0)
NEUTROS ABS: 3 10*3/uL (ref 1.7–7.7)
NEUTROS PCT: 61 %
Platelets: 140 10*3/uL — ABNORMAL LOW (ref 150–400)
RBC: 4.18 MIL/uL (ref 3.87–5.11)
RDW: 14 % (ref 11.5–15.5)
WBC: 4.8 10*3/uL (ref 4.0–10.5)

## 2015-04-21 LAB — BASIC METABOLIC PANEL
ANION GAP: 6 (ref 5–15)
BUN: 17 mg/dL (ref 6–20)
CHLORIDE: 101 mmol/L (ref 101–111)
CO2: 34 mmol/L — AB (ref 22–32)
Calcium: 8 mg/dL — ABNORMAL LOW (ref 8.9–10.3)
Creatinine, Ser: 1.18 mg/dL — ABNORMAL HIGH (ref 0.44–1.00)
GFR calc non Af Amer: 39 mL/min — ABNORMAL LOW (ref >60)
GFR, EST AFRICAN AMERICAN: 46 mL/min — AB (ref >60)
Glucose, Bld: 111 mg/dL — ABNORMAL HIGH (ref 65–99)
POTASSIUM: 3.1 mmol/L — AB (ref 3.5–5.1)
SODIUM: 141 mmol/L (ref 135–145)

## 2015-04-21 LAB — TROPONIN I

## 2015-04-21 MED ORDER — DM-GUAIFENESIN ER 30-600 MG PO TB12: 1.0000 | ORAL_TABLET | Freq: Two times a day (BID) | ORAL | Status: DC | PRN

## 2015-04-21 MED ORDER — ALBUTEROL SULFATE (2.5 MG/3ML) 0.083% IN NEBU
2.5000 mg | INHALATION_SOLUTION | Freq: Once | RESPIRATORY_TRACT | Status: AC
  Administered 2015-04-21: 2.5 mg via RESPIRATORY_TRACT
  Filled 2015-04-21: qty 3

## 2015-04-21 MED ORDER — LORAZEPAM 0.5 MG PO TABS
0.5000 mg | ORAL_TABLET | Freq: Once | ORAL | Status: AC
  Administered 2015-04-21: 0.5 mg via ORAL
  Filled 2015-04-21: qty 1

## 2015-04-21 MED ORDER — PREDNISONE 20 MG PO TABS: 20.0000 mg | ORAL_TABLET | Freq: Every day | ORAL | Status: DC

## 2015-04-21 MED ORDER — ALBUTEROL SULFATE HFA 108 (90 BASE) MCG/ACT IN AERS
1.0000 | INHALATION_SPRAY | Freq: Four times a day (QID) | RESPIRATORY_TRACT | Status: DC | PRN
Start: 2015-04-21 — End: 2017-01-13

## 2015-04-21 MED ORDER — LORAZEPAM 1 MG PO TABS: 1.0000 mg | ORAL_TABLET | ORAL | Status: DC | PRN

## 2015-04-21 NOTE — ED Notes (Signed)
Pt wheelchaired to bathroom, pt incontinent but had on pad, replaced panties with diaper.

## 2015-04-21 NOTE — ED Notes (Signed)
Pt made aware to return if symptoms worsen or if any life threatening symptoms occur.   

## 2015-04-21 NOTE — ED Notes (Signed)
Pt has been treated recently for bronchitis - has been coughing and about 1 hour ago started developing chest pain -  Stated that she had some lightheadedness but this has improved

## 2015-04-21 NOTE — ED Provider Notes (Signed)
CSN: [id number]     Arrival date & time 04/21/15  1414 History   First MD Initiated Contact with Patient 04/21/15 1424     Chief Complaint  Patient presents with  . Chest Pain  . Bronchitis      HPI  Patient presents for evaluation of "bronchitis". Copy by her daughter. She states that she has been seen by her doctor twice in the last month. Has been put on 2 different antibiotics for a bronchitis. She states at times she feels better. Still has paroxysms of coughing. It happened this morning. After her episode of coughing she states she felt some burning in her chest.  This is almost resolved now. She states she has feels "very anxious". Her daughter states that whenever she doesn't feel well she tends to get anxiety.  No history of coronary artery disease. Does have a known aortic aneurysm that is being simply followed with no plans for intervention. No diagnosed history of COPD or asthma. Cell use inhalers.  Has not had any sputum production or fever. Does not feel short of breath.  Past Medical History  Diagnosis Date  . History of blood clots     in leg  . History of knee surgery   . Mini stroke   . Hip fracture     hip surgery 2001  . Stomach ulcer     secondary to h.pylori, s/p treatment  . Thyroid condition   . Bladder infection     h/o  . Kidney infection     h/o  . Melanoma of thigh     left  . Aneurysm, thoracic aortic   . Dehydration     HISTORY   . S/P colonoscopy March 2010    RMR: friable anal canal hemorrhoids, hyperplastic ascending polyp, adenomatous descending polyp   . Fall at home 09/10/12  . Melanoma of skin 03/14/2013  . DCIS (ductal carcinoma in situ) of breast 03/14/2013    right breast  . Iron deficiency anemia, unspecified 03/14/2013    secondary to GI blood loss  . Ascending aortic aneurysm     CT in 2012 - 4.2x4.2cm  . Aortic insufficiency   . Venous stasis     edema  . Hypertension   . Hemorrhoids    Past Surgical History  Procedure  Laterality Date  . Partial hysterectomy  1976  . Leg surgery    . Breast lumpectomy  1998  . Appendectomy  1942  . Cataract extraction, bilateral    . Melanoma excision Left 08/2006    left leg excision  . Tonsillectomy    . Cholecystectomy    . Varicose vein surgery    . Esophagogastroduodenoscopy  06/05/11    small hiatal hernia; + H.PYLORI GASTRITIS, s/p 5 days of Pylera, unable to finish due to N/V  . Colonoscopy  06/05/11    pancolonic diverticulosis/ileal reosion/abnormal anorectal junction s/p biopsy: path for small intestine and Ti was benign with non-villous atrophy, rectal biopsy with prominent prolapse changes, no acute inflammation  . Corneal transplant Bilateral 2013  . Knee surgery Right 05/2006    total right  . Total hip arthroplasty Left 2001&2004     X 2 FOR LEFT HIP  . Esophagogastroduodenoscopy (egd) with esophageal dilation N/A 06/08/2013    [id number] hiatal hernia;  otherwise normal EGD s/p dilation  . Givens capsule study N/A 06/08/2013    Procedure: GIVENS CAPSULE STUDY;  Surgeon: Daneil Dolin, MD;  Location: [facility];  Service:  Endoscopy;  Laterality: N/A;  . Esophagogastroduodenoscopy N/A 06/13/2013    Dr. Oneida Alar- see enteroscopy  . Enteroscopy N/A 06/13/2013    [id number] Gastritis/GI BLEED MOST LIKELY DUE TO DUODENAL ULCERS, AND ? AVMs  . Transthoracic echocardiogram  10/06/2012    EF 55-60%, mod eccentric hypertrophy, grade 2 diastolic dysfunction; mildly calcifed AV annulus with moderate regurg; aortic root mildly dilated; LA severely dailted; PA peak pressure 25mHg  . Nm myocar perf wall motion  03/27/2010    dipyridamole; small reversible basal to mid-septal defect (?artifact), post-stress EF 55%, low risk scan   . Hemorrhoid banding     Family History  Problem Relation Age of Onset  . Colon cancer Neg Hx   . Breast cancer Daughter     also hyperlipidemia  . Breast cancer Daughter     also hyperlipidemia  . Asthma Mother   . Multiple sclerosis  Child   . Hyperlipidemia Child    Social History  Substance Use Topics  . Smoking status: Former Smoker -- 1.50 packs/day for 20 years    Types: Cigarettes    Quit date: 07/05/1975  . Smokeless tobacco: Never Used     Comment: Quit smoking in 1975  . Alcohol Use: No   OB History    No data available     Review of Systems  Constitutional: Negative for fever, chills, diaphoresis, appetite change and fatigue.  HENT: Negative for mouth sores, sore throat and trouble swallowing.   Eyes: Negative for visual disturbance.  Respiratory: Positive for cough. Negative for chest tightness and wheezing.   Cardiovascular: Negative for chest pain.       "Burning" in her chest. No pain or pressure. No radiation.  Gastrointestinal: Negative for nausea, vomiting, abdominal pain, diarrhea and abdominal distention.  Endocrine: Negative for polydipsia, polyphagia and polyuria.  Genitourinary: Negative for dysuria, frequency and hematuria.  Musculoskeletal: Negative for gait problem.  Skin: Negative for color change, pallor and rash.  Neurological: Negative for dizziness, syncope, light-headedness and headaches.  Hematological: Does not bruise/bleed easily.  Psychiatric/Behavioral: Negative for behavioral problems and confusion. The patient is nervous/anxious.       Allergies  Codeine; Iron; Asa; and Penicillins  Home Medications   Prior to Admission medications   Medication Sig Start Date End Date Taking? Authorizing Provider  clopidogrel (PLAVIX) 75 MG tablet Take 75 mg by mouth daily with breakfast.    Historical Provider, MD  furosemide (LASIX) 20 MG tablet Take 1 tablet (20 mg total) by mouth daily. 03/13/14   Nat Christen, MD  gabapentin (NEURONTIN) 100 MG capsule Take 2 capsules (200 mg total) by mouth 3 (three) times daily. 02/19/15   Carole Civil, MD  hydrocortisone (ANUSOL-HC) 2.5 % rectal cream Apply rectally 2 times daily Patient taking differently: Place 1 application rectally 2  (two) times daily as needed for hemorrhoids or itching. Apply rectally 2 times daily 04/05/14   Veryl Speak, MD  Multiple Vitamins-Minerals (OPTI-VITAMINS PO) Take 1 tablet by mouth daily.    Historical Provider, MD  ondansetron (ZOFRAN) 4 MG tablet Take 1 tablet (4 mg total) by mouth every 8 (eight) hours as needed for nausea or vomiting. Patient taking differently: Take 4 mg by mouth every morning. *MAY TAKE AS NEEDED DAILY BUT TAKES ONE TABLET IN THE MORNING 09/13/13   Mahala Menghini, PA-C  pantoprazole (PROTONIX) 40 MG tablet Take 1 tablet (40 mg total) by mouth 2 (two) times daily. 02/09/15   Orvil Feil, NP  prednisoLONE acetate (  PRED FORTE) 1 % ophthalmic suspension 1 drop 4 (four) times daily.    Historical Provider, MD  psyllium (METAMUCIL) 58.6 % packet Take 1 packet by mouth 2 (two) times daily.    Historical Provider, MD  sodium chloride (MURO 128) 2 % ophthalmic solution Place 1 drop into both eyes 3 (three) times daily.     Historical Provider, MD  traMADol (ULTRAM) 50 MG tablet Take 1 tablet (50 mg total) by mouth every 4 (four) hours as needed. 02/23/15   Carole Civil, MD   BP 173/62 mmHg  Pulse 63  Temp(Src) 97.8 F (36.6 C) (Oral)  Resp 18  Ht 5\' 5"  (1.651 m)  Wt 169 lb (76.658 kg)  BMI 28.12 kg/m2  SpO2 99% Physical Exam  Constitutional: She is oriented to person, place, and time. She appears well-developed and well-nourished. No distress.  HENT:  Head: Normocephalic.  Eyes: Conjunctivae are normal. Pupils are equal, round, and reactive to light. No scleral icterus.  Neck: Normal range of motion. Neck supple. No thyromegaly present.  Cardiovascular: Normal rate and regular rhythm.  Exam reveals no gallop and no friction rub.   No murmur heard. Pulmonary/Chest: Effort normal and breath sounds normal. No respiratory distress. She has no wheezes. She has no rales.  Few scattered wheezes. No diminished breath sounds. No focal signs of consolidation.  Abdominal: Soft.  Bowel sounds are normal. She exhibits no distension. There is no tenderness. There is no rebound.  Musculoskeletal: Normal range of motion.  Neurological: She is alert and oriented to person, place, and time.  Skin: Skin is warm and dry. No rash noted.  Psychiatric: She has a normal mood and affect. Her behavior is normal.  Does appear anxious. Otherwise appropriate.    ED Course  Procedures (including critical care time) Labs Review Labs Reviewed  CBC WITH DIFFERENTIAL/PLATELET  BASIC METABOLIC PANEL  TROPONIN I    Imaging Review No results found. I have personally reviewed and evaluated these images and lab results as part of my medical decision-making.   EKG Interpretation   Date/Time:  Saturday April 21 2015 14:20:51 EDT Ventricular Rate:  59 PR Interval:  153 QRS Duration: 157 QT Interval:  501 QTC Calculation: 496 R Axis:   -5 Text Interpretation:  Sinus rhythm Atrial premature complexes Right bundle  branch block Confirmed by Jeneen Rinks  MD, [place] ([id number]) on 04/21/2015 2:34:50 PM      MDM   Final diagnoses:  None    Symptoms do not sound like angina. Does not have signs of bronchospasm or consolidation clinically. EKG shows no acute abnormalities. Symptoms and present since early this morning. Plan will be single troponin, chest x-ray. Ativan 0.5, albuterol neb, reevaluation.   15:10:  Patient feeling much improved. X-ray shows no acute process. Labs pending.   Tanna Furry, MD 04/21/15 [phone]

## 2015-04-21 NOTE — Discharge Instructions (Signed)
Use inhaler as needed for coughing spells or wheezing. Mucinex as needed for cough prevention.  Cough, Adult  A cough is a reflex that helps clear your throat and airways. It can help heal the body or may be a reaction to an irritated airway. A cough may only last 2 or 3 weeks (acute) or may last more than 8 weeks (chronic).  CAUSES Acute cough:  Viral or bacterial infections. Chronic cough:  Infections.  Allergies.  Asthma.  Post-nasal drip.  Smoking.  Heartburn or acid reflux.  Some medicines.  Chronic lung problems (COPD).  Cancer. SYMPTOMS   Cough.  Fever.  Chest pain.  Increased breathing rate.  High-pitched whistling sound when breathing (wheezing).  Colored mucus that you cough up (sputum). TREATMENT   A bacterial cough may be treated with antibiotic medicine.  A viral cough must run its course and will not respond to antibiotics.  Your caregiver may recommend other treatments if you have a chronic cough. HOME CARE INSTRUCTIONS   Only take over-the-counter or prescription medicines for pain, discomfort, or fever as directed by your caregiver. Use cough suppressants only as directed by your caregiver.  Use a cold steam vaporizer or humidifier in your bedroom or home to help loosen secretions.  Sleep in a semi-upright position if your cough is worse at night.  Rest as needed.  Stop smoking if you smoke. SEEK IMMEDIATE MEDICAL CARE IF:   You have pus in your sputum.  Your cough starts to worsen.  You cannot control your cough with suppressants and are losing sleep.  You begin coughing up blood.  You have difficulty breathing.  You develop pain which is getting worse or is uncontrolled with medicine.  You have a fever. MAKE SURE YOU:   Understand these instructions.  Will watch your condition.  Will get help right away if you are not doing well or get worse. Document Released: 01/17/2011 Document Revised: 10/13/2011 Document  Reviewed: 01/17/2011 [facility] Patient Information 2015 [place], [place]. This information is not intended to replace advice given to you by your health care provider. Make sure you discuss any questions you have with your health care provider.

## 2015-05-03 ENCOUNTER — Encounter (HOSPITAL_COMMUNITY): Payer: Self-pay | Admitting: Hematology & Oncology

## 2015-05-03 ENCOUNTER — Encounter (HOSPITAL_COMMUNITY): Payer: Medicare Other | Attending: Hematology & Oncology | Admitting: Hematology & Oncology

## 2015-05-03 ENCOUNTER — Encounter (HOSPITAL_BASED_OUTPATIENT_CLINIC_OR_DEPARTMENT_OTHER): Payer: Medicare Other

## 2015-05-03 VITALS — BP 138/88 | HR 72 | Temp 97.7°F | Resp 18 | Wt 171.8 lb

## 2015-05-03 DIAGNOSIS — Z23 Encounter for immunization: Secondary | ICD-10-CM | POA: Diagnosis present

## 2015-05-03 DIAGNOSIS — R591 Generalized enlarged lymph nodes: Secondary | ICD-10-CM | POA: Insufficient documentation

## 2015-05-03 DIAGNOSIS — C439 Malignant melanoma of skin, unspecified: Secondary | ICD-10-CM | POA: Diagnosis not present

## 2015-05-03 DIAGNOSIS — D509 Iron deficiency anemia, unspecified: Secondary | ICD-10-CM | POA: Diagnosis present

## 2015-05-03 DIAGNOSIS — R599 Enlarged lymph nodes, unspecified: Secondary | ICD-10-CM

## 2015-05-03 LAB — CBC WITH DIFFERENTIAL/PLATELET
BASOS ABS: 0 10*3/uL (ref 0.0–0.1)
BASOS PCT: 0 %
Eosinophils Absolute: 0.2 10*3/uL (ref 0.0–0.7)
Eosinophils Relative: 3 %
HEMATOCRIT: 39.5 % (ref 36.0–46.0)
HEMOGLOBIN: 12.4 g/dL (ref 12.0–15.0)
Lymphocytes Relative: 32 %
Lymphs Abs: 2.1 10*3/uL (ref 0.7–4.0)
MCH: 29.3 pg (ref 26.0–34.0)
MCHC: 31.4 g/dL (ref 30.0–36.0)
MCV: 93.4 fL (ref 78.0–100.0)
Monocytes Absolute: 0.6 10*3/uL (ref 0.1–1.0)
Monocytes Relative: 10 %
NEUTROS ABS: 3.5 10*3/uL (ref 1.7–7.7)
NEUTROS PCT: 55 %
Platelets: 162 10*3/uL (ref 150–400)
RBC: 4.23 MIL/uL (ref 3.87–5.11)
RDW: 14.7 % (ref 11.5–15.5)
WBC: 6.4 10*3/uL (ref 4.0–10.5)

## 2015-05-03 LAB — COMPREHENSIVE METABOLIC PANEL
ALBUMIN: 3.6 g/dL (ref 3.5–5.0)
ALK PHOS: 50 U/L (ref 38–126)
ALT: 11 U/L — ABNORMAL LOW (ref 14–54)
AST: 21 U/L (ref 15–41)
Anion gap: 8 (ref 5–15)
BILIRUBIN TOTAL: 1.1 mg/dL (ref 0.3–1.2)
BUN: 17 mg/dL (ref 6–20)
CO2: 34 mmol/L — ABNORMAL HIGH (ref 22–32)
Calcium: 7.8 mg/dL — ABNORMAL LOW (ref 8.9–10.3)
Chloride: 100 mmol/L — ABNORMAL LOW (ref 101–111)
Creatinine, Ser: 1.05 mg/dL — ABNORMAL HIGH (ref 0.44–1.00)
GFR calc Af Amer: 53 mL/min — ABNORMAL LOW (ref >60)
GFR calc non Af Amer: 45 mL/min — ABNORMAL LOW (ref >60)
GLUCOSE: 109 mg/dL — AB (ref 65–99)
POTASSIUM: 3.1 mmol/L — AB (ref 3.5–5.1)
Sodium: 142 mmol/L (ref 135–145)
TOTAL PROTEIN: 6.4 g/dL — AB (ref 6.5–8.1)

## 2015-05-03 LAB — FERRITIN: Ferritin: 33 ng/mL (ref 11–307)

## 2015-05-03 MED ORDER — INFLUENZA VAC SPLIT QUAD 0.5 ML IM SUSY
PREFILLED_SYRINGE | INTRAMUSCULAR | Status: AC
  Filled 2015-05-03: qty 0.5

## 2015-05-03 MED ORDER — INFLUENZA VAC SPLIT QUAD 0.5 ML IM SUSY
0.5000 mL | PREFILLED_SYRINGE | Freq: Once | INTRAMUSCULAR | Status: AC
  Administered 2015-05-03: 0.5 mL via INTRAMUSCULAR

## 2015-05-03 NOTE — Patient Instructions (Addendum)
[place] at [facility] Discharge Instructions  RECOMMENDATIONS MADE BY THE CONSULTANT AND ANY TEST RESULTS WILL BE SENT TO YOUR REFERRING PHYSICIAN.  Exam completed by Dr Whitney Muse today [place] scheduled- make sure your bladder is full when your arrive for your appt Return to see the doctor in 1 year Please call the clinic if you have any questions or concerns.  Thank you for choosing [place] at [facility] to provide your oncology and hematology care.  To afford each patient quality time with our provider, please arrive at least 15 minutes before your scheduled appointment time.    You need to re-schedule your appointment should you arrive 10 or more minutes late.  We strive to give you quality time with our providers, and arriving late affects you and other patients whose appointments are after yours.  Also, if you no show three or more times for appointments you may be dismissed from the clinic at the providers discretion.     Again, thank you for choosing [facility].  Our hope is that these requests will decrease the amount of time that you wait before being seen by our physicians.       _____________________________________________________________  Should you have questions after your visit to [facility], please contact our office at [phone] between the hours of 8:30 a.m. and 4:30 p.m.  Voicemails left after 4:30 p.m. will not be returned until the following business day.  For prescription refill requests, have your pharmacy contact our office.

## 2015-05-03 NOTE — Progress Notes (Signed)
LABS DRAWN

## 2015-05-03 NOTE — Progress Notes (Signed)
Rocky Morel, MD [id number] [place] [address]  No diagnosis found.  CURRENT THERAPY: Observation  INTERVAL HISTORY: [name] [name] 81 y.o. female returns for  regular  visit for followup of  Stage I (T1 A.) melanoma the of left thigh,  superficial spreading-type,  thus far without recurrence with resection in December 2007 and re-resection 08/06/2006. She had negative margins at that time.  AND DCIS of the right breast in June 1998 status post surgical resection and she thinks she had radiation therapy as well.  AND Iron deficiency anemia GI blood losses with colonoscopy revealing friable anal canal hemorrhoids and diverticulosis in March 2010 by Dr. Sydell Axon.  The patients has been doing well.  She lives by herself.  She had Bronchitis a couple of weeks ago that was on the "verge of Pneumonia."  She has fully recovered.  She denies any more issues with her skin.  She no longer sees her Dermatologist. She notes that Dr. Romona Curls always "looked her over" for any suspicious skin lesions. Her primary is Dr. Ethlyn Gallery.  Her appetite is well.  Her daughter notes she has decreased hearing.  She still gets mammograms.  She has not had a breast exam in a long time.  She has had eye surgery.  Her Melanoma was located on the L thigh, treated by Dr.Bradford.  She has no concerns at this time.      Weight is stable.  Past Medical History  Diagnosis Date  . History of blood clots     in leg  . History of knee surgery   . Mini stroke   . Hip fracture     hip surgery 2001  . Stomach ulcer     secondary to h.pylori, s/p treatment  . Thyroid condition   . Bladder infection     h/o  . Kidney infection     h/o  . Melanoma of thigh     left  . Aneurysm, thoracic aortic   . Dehydration     HISTORY   . S/P colonoscopy March 2010    RMR: friable anal canal hemorrhoids, hyperplastic ascending polyp, adenomatous descending polyp   . Fall at home 09/10/12    . Melanoma of skin 03/14/2013  . DCIS (ductal carcinoma in situ) of breast 03/14/2013    right breast  . Iron deficiency anemia, unspecified 03/14/2013    secondary to GI blood loss  . Ascending aortic aneurysm     CT in 2012 - 4.2x4.2cm  . Aortic insufficiency   . Venous stasis     edema  . Hypertension   . Hemorrhoids     has LOWER LEG, ARTHRITIS, DEGEN./OSTEO; HIP PAIN; PAIN IN JOINT, LOWER LEG; BURSITIS, HIP; Bladder infection; Anemia; Helicobacter pylori gastritis; S/P total knee replacement; Hip pain; BPPV (benign paroxysmal positional vertigo); RBBB; Ascending aortic aneurysm; Aortic insufficiency; Melanoma of skin; DCIS (ductal carcinoma in situ) of breast; Iron deficiency anemia, unspecified; Early satiety; Loss of weight; Globus sensation; IDA (iron deficiency anemia); Hemorrhoids; Occult GI bleeding; Lumbago; and Constipation on her problem list.     is allergic to codeine; iron; asa; and penicillins.  Ms. Kokes had no medications administered during this visit.  Past Surgical History  Procedure Laterality Date  . Partial hysterectomy  1976  . Leg surgery    . Breast lumpectomy  1998  . Appendectomy  1942  . Cataract extraction, bilateral    . Melanoma excision Left  08/2006    left leg excision  . Tonsillectomy    . Cholecystectomy    . Varicose vein surgery    . Esophagogastroduodenoscopy  06/05/11    small hiatal hernia; + H.PYLORI GASTRITIS, s/p 5 days of Pylera, unable to finish due to N/V  . Colonoscopy  06/05/11    pancolonic diverticulosis/ileal reosion/abnormal anorectal junction s/p biopsy: path for small intestine and Ti was benign with non-villous atrophy, rectal biopsy with prominent prolapse changes, no acute inflammation  . Corneal transplant Bilateral 2013  . Knee surgery Right 05/2006    total right  . Total hip arthroplasty Left 2001&2004     X 2 FOR LEFT HIP  . Esophagogastroduodenoscopy (egd) with esophageal dilation N/A 06/08/2013    [id number]  hiatal hernia;  otherwise normal EGD s/p dilation  . Givens capsule study N/A 06/08/2013    Procedure: GIVENS CAPSULE STUDY;  Surgeon: Daneil Dolin, MD;  Location: [facility];  Service: Endoscopy;  Laterality: N/A;  . Esophagogastroduodenoscopy N/A 06/13/2013    Dr. Oneida Alar- see enteroscopy  . Enteroscopy N/A 06/13/2013    [id number] Gastritis/GI BLEED MOST LIKELY DUE TO DUODENAL ULCERS, AND ? AVMs  . Transthoracic echocardiogram  10/06/2012    EF 55-60%, mod eccentric hypertrophy, grade 2 diastolic dysfunction; mildly calcifed AV annulus with moderate regurg; aortic root mildly dilated; LA severely dailted; PA peak pressure 81mHg  . Nm myocar perf wall motion  03/27/2010    dipyridamole; small reversible basal to mid-septal defect (?artifact), post-stress EF 55%, low risk scan   . Hemorrhoid banding      Denies any headaches, dizziness, double vision, fevers, chills, night sweats, nausea, vomiting, diarrhea, constipation, chest pain, heart palpitations, shortness of breath, blood in stool, black tarry stool, urinary pain, urinary burning, urinary frequency, hematuria. 14 point review of systems was performed and is negative except as detailed under history of present illness and above    PHYSICAL EXAMINATION  ECOG PERFORMANCE STATUS: 2 - Symptomatic, <50% confined to bed  Filed Vitals:   05/03/15 1000  BP: 138/88  Pulse: 72  Temp: 97.7 F (36.5 C)  Resp: 18    GENERAL:alert, no distress, well nourished, well developed, comfortable, cooperative, obese and smiling.  Uses walker SKIN: skin color, texture, turgor are normal, no rashes or significant lesions HEAD: Normocephalic, No masses, lesions, tenderness or abnormalities EYES: normal, PERRLA, EOMI, Conjunctiva are pink and non-injected EARS: External ears normal.  Hearing aid in R ear OROPHARYNX:mucous membranes are moist  NECK: supple, no adenopathy, thyroid normal size, non-tender, without nodularity, no stridor, non-tender,  trachea midline LYMPH:  no palpable lymphadenopathy BREAST:not examined LUNGS: clear to auscultation and percussion HEART: regular rate & rhythm, no murmurs, no gallops, S1 normal and S2 normal ABDOMEN:abdomen soft, non-tender, obese and normal bowel sounds BACK: Back symmetric, no curvature., No CVA tenderness EXTREMITIES:less then 2 second capillary refill, no joint deformities, effusion, or inflammation, no edema, no skin discoloration, no clubbing, no cyanosis  NEURO: alert & oriented x 3 with fluent speech, no focal motor/sensory deficits, gait normal with cane for stability Breast exam: surgical incision site, 12:00 position, vertical, extends to nipple.  Firmness secondary to radiation changes. L breast large incision site, along superior aspect.  No palpable masses, no axillary adenopathy   LABORATORY DATA: I have reviewed the data below as listed. CBC    Component Value Date/Time   WBC 6.4 05/03/2015 1002   RBC 4.23 05/03/2015 1002   RBC 4.11 04/13/2014 1520   HGB  12.4 05/03/2015 1002   HCT 39.5 05/03/2015 1002   PLT 162 05/03/2015 1002   MCV 93.4 05/03/2015 1002   MCH 29.3 05/03/2015 1002   MCHC 31.4 05/03/2015 1002   RDW 14.7 05/03/2015 1002   LYMPHSABS 2.1 05/03/2015 1002   MONOABS 0.6 05/03/2015 1002   EOSABS 0.2 05/03/2015 1002   BASOSABS 0.0 05/03/2015 1002      Chemistry      Component Value Date/Time   NA 141 04/21/2015 1503   K 3.1* 04/21/2015 1503   CL 101 04/21/2015 1503   CO2 34* 04/21/2015 1503   BUN 17 04/21/2015 1503   CREATININE 1.18* 04/21/2015 1503   CREATININE 0.80 09/06/2013 1030      Component Value Date/Time   CALCIUM 8.0* 04/21/2015 1503   ALKPHOS 106 04/13/2014 1520   AST 19 04/13/2014 1520   ALT 8 04/13/2014 1520   BILITOT 0.5 04/13/2014 1520       RADIOGRAPHIC STUDIES: CLINICAL DATA: Shortness of breath. Persistent cough.  EXAM: CHEST 2 VIEW  COMPARISON: 12/18/2014  FINDINGS: Cardiomediastinal silhouette is  stably enlarged. Mediastinal contours appear intact. The aorta is torturous and contains atherosclerotic calcifications at the arch.  There is no evidence of focal airspace consolidation, pleural effusion or pneumothorax. There are coarse interstitial markings.  There is an irregularity of the cortex of the right posterior sixth rib, which may represent a nondisplaced fracture. There is diffuse osteopenia. Soft tissues are grossly normal.  IMPRESSION: No evidence of acute cardiopulmonary disease.  Coarse interstitial markings, likely related to chronic interstitial lung disease.  Possible nondisplaced right sixth rib fracture, with uncertain acuity.   Electronically Signed  By: Fidela Salisbury M.D.  On: 04/21/2015 15:03     ASSESSMENT:  1. Stage I (T1 A.) melanoma the of left thigh,  superficial spreading-type,  thus far without recurrence with resection in December 2007 and re-resection 08/06/2006. She had negative margins at that time.  2. DCIS of the right breast in June 1998 status post surgical resection and she thinks she had radiation therapy as well.  3. Iron deficiency anemia GI blood losses with colonoscopy revealing friable anal canal hemorrhoids and diverticulosis in March 2010 by Dr. Sydell Axon  4. Right knee replacement 2007 by Dr. Aline Brochure  5. Left hip replacement x2 by Dr. Eulas Post years ago  6. Left groin adenopathy on physical exam  Patient Active Problem List   Diagnosis Date Noted  . Constipation 02/09/2015  . Lumbago 04/27/2014  . Hemorrhoids 08/23/2013  . Occult GI bleeding 08/23/2013  . Early satiety 06/07/2013  . Loss of weight 06/07/2013  . Globus sensation 06/07/2013  . IDA (iron deficiency anemia) 06/07/2013  . Melanoma of skin 03/14/2013  . DCIS (ductal carcinoma in situ) of breast 03/14/2013  . Iron deficiency anemia, unspecified 03/14/2013  . BPPV (benign paroxysmal positional vertigo) 02/10/2013  . RBBB 02/10/2013  . Ascending  aortic aneurysm 02/10/2013  . Aortic insufficiency 02/10/2013  . Hip pain 11/02/2012  . S/P total knee replacement 08/14/2011  . Helicobacter pylori gastritis 07/14/2011  . Anemia 02/04/2011  . Bladder infection   . BURSITIS, HIP 05/29/2009  . LOWER LEG, ARTHRITIS, DEGEN./OSTEO 11/23/2007  . HIP PAIN 06/07/2007  . PAIN IN JOINT, LOWER LEG 06/07/2007    I did feel adenopathy in the left groin on exam. I think it would be unlikely its her melanoma is her weight is stable, she has no other major concerns or complaints. I do however feel needs evaluation with ultrasound and she  is agreeable. She would like to continue with yearly follow-up I will keep apprised results when he ultrasound when it is available. Should it show any adenopathy that is suspicious we can discuss additional recommendations at that time.  All questions were answered. The patient knows to call the clinic with any problems, questions or concerns. We can certainly see the patient much sooner if necessary.   This document serves as a record of services personally performed by Ancil Linsey, MD. It was created on her behalf by Janace Hoard, a trained medical scribe. The creation of this record is based on the scribe's personal observations and the provider's statements to them. This document has been checked and approved by the attending provider.  I have reviewed the above documentation for accuracy and completeness, and I agree with the above.  This note was electronically signed.  Kelby Fam. Whitney Muse, MD

## 2015-05-03 NOTE — Progress Notes (Signed)
Lalena P Keehan's reason for visit today is for an injection and labs as scheduled per MD orders.    Eniya P Markus also received flu vaccine per MD orders; see [facility] for administration details.  [name] [name] tolerated all procedures well and without incident; questions were answered and patient was discharged.

## 2015-05-04 ENCOUNTER — Encounter (HOSPITAL_COMMUNITY): Payer: Self-pay | Admitting: Hematology & Oncology

## 2015-05-10 ENCOUNTER — Ambulatory Visit (HOSPITAL_COMMUNITY)
Admission: RE | Admit: 2015-05-10 | Discharge: 2015-05-10 | Disposition: A | Discharge status: Home / Self Care | Payer: Medicare Other | Source: Ambulatory Visit | Attending: Hematology & Oncology | Admitting: Hematology & Oncology

## 2015-05-10 DIAGNOSIS — R599 Enlarged lymph nodes, unspecified: Secondary | ICD-10-CM

## 2015-05-10 DIAGNOSIS — R1030 Lower abdominal pain, unspecified: Secondary | ICD-10-CM | POA: Diagnosis not present

## 2015-05-10 DIAGNOSIS — R591 Generalized enlarged lymph nodes: Secondary | ICD-10-CM

## 2015-05-10 DIAGNOSIS — R1904 Left lower quadrant abdominal swelling, mass and lump: Secondary | ICD-10-CM | POA: Insufficient documentation

## 2015-05-10 DIAGNOSIS — Z85820 Personal history of malignant melanoma of skin: Secondary | ICD-10-CM | POA: Diagnosis not present

## 2015-05-10 DIAGNOSIS — C439 Malignant melanoma of skin, unspecified: Secondary | ICD-10-CM

## 2015-05-13 ENCOUNTER — Other Ambulatory Visit: Payer: Self-pay | Admitting: Orthopedic Surgery

## 2015-05-14 ENCOUNTER — Encounter (HOSPITAL_COMMUNITY): Payer: Self-pay | Admitting: Lab

## 2015-05-14 NOTE — Progress Notes (Signed)
Referral sent to Dr Arnoldo Morale.  Records faxed on 10/10.

## 2015-05-16 ENCOUNTER — Other Ambulatory Visit: Payer: Self-pay | Admitting: *Deleted

## 2015-05-16 ENCOUNTER — Encounter: Payer: Self-pay | Admitting: Gastroenterology

## 2015-05-16 ENCOUNTER — Ambulatory Visit (INDEPENDENT_AMBULATORY_CARE_PROVIDER_SITE_OTHER): Payer: Medicare Other | Admitting: Gastroenterology

## 2015-05-16 VITALS — BP 133/64 | HR 81 | Temp 97.3°F | Ht 66.0 in | Wt 167.6 lb

## 2015-05-16 DIAGNOSIS — K625 Hemorrhage of anus and rectum: Secondary | ICD-10-CM | POA: Diagnosis not present

## 2015-05-16 MED ORDER — TRAMADOL HCL 50 MG PO TABS: 50.0000 mg | ORAL_TABLET | ORAL | Status: DC | PRN

## 2015-05-16 MED ORDER — HYDROCORTISONE ACETATE 25 MG RE SUPP: 25.0000 mg | Freq: Two times a day (BID) | RECTAL | Status: AC

## 2015-05-16 NOTE — Telephone Encounter (Signed)
PATIENT IS CALLING REQUESTING A REFILL ON HER TRAMADOL, PLEASE ADVISE?

## 2015-05-16 NOTE — Patient Instructions (Signed)
I sent in suppositories to your pharmacy. This may or may not be covered. Please let me know.  Continue Metamucil daily.  Please call me when you are done seeing Dr. Arnoldo Morale. I recommend a colonoscopy in near future if you are willing.

## 2015-05-16 NOTE — Progress Notes (Signed)
Referring Provider: Caren Macadam, MD Primary Care Physician:  Rocky Morel, MD  Primary GI: Dr. Gala Romney   Chief Complaint  Patient presents with  . Follow-up    HPI:   [name] [name] is a 81 y.o. female presenting today with a history of GERD, hemorrhoids s/p banding in 2015, occasional nausea. Last seen July 2016 after episode of rectal bleeding in setting of straining. Rectal exam at that time with multiple hemorrhoid tags but without obvious anal fissure. Prescribed anusol cream, miralax each evening.   Sees blood every 3 days or so, not "alot" just enough to "mess up underwear". States not sitting on toilet a long time. BM about every day. Might skip a day. Soft. Sometimes has to strain. Last colonoscopy Nov 2012. Painless rectal bleeding. No pain with defecation. Metamucil daily. Currently undergoing evaluation for groin lymphadenopathy, history o melanoma. Followed by Dr. Whitney Muse.   Past Medical History  Diagnosis Date  . History of blood clots     in leg  . History of knee surgery   . Mini stroke ([place])   . Hip fracture ([place])     hip surgery 2001  . Stomach ulcer     secondary to h.pylori, s/p treatment  . Thyroid condition   . Bladder infection     h/o  . Kidney infection     h/o  . Melanoma of thigh ([place])     left  . Aneurysm, thoracic aortic ([place])   . Dehydration     HISTORY   . S/P colonoscopy March 2010    RMR: friable anal canal hemorrhoids, hyperplastic ascending polyp, adenomatous descending polyp   . Fall at home 09/10/12  . Melanoma of skin ([place]) 03/14/2013  . DCIS (ductal carcinoma in situ) of breast 03/14/2013    right breast  . Iron deficiency anemia, unspecified 03/14/2013    secondary to GI blood loss  . Ascending aortic aneurysm ([place])     CT in 2012 - 4.2x4.2cm  . Aortic insufficiency   . Venous stasis     edema  . Hypertension   . Hemorrhoids     Past Surgical History  Procedure Laterality Date  . Partial hysterectomy  1976   . Leg surgery    . Breast lumpectomy  1998  . Appendectomy  1942  . Cataract extraction, bilateral    . Melanoma excision Left 08/2006    left leg excision  . Tonsillectomy    . Cholecystectomy    . Varicose vein surgery    . Esophagogastroduodenoscopy  06/05/11    small hiatal hernia; + H.PYLORI GASTRITIS, s/p 5 days of Pylera, unable to finish due to N/V  . Colonoscopy  06/05/11    pancolonic diverticulosis/ileal reosion/abnormal anorectal junction s/p biopsy: path for small intestine and Ti was benign with non-villous atrophy, rectal biopsy with prominent prolapse changes, no acute inflammation  . Corneal transplant Bilateral 2013  . Knee surgery Right 05/2006    total right  . Total hip arthroplasty Left 2001&2004     X 2 FOR LEFT HIP  . Esophagogastroduodenoscopy (egd) with esophageal dilation N/A 06/08/2013    [id number] hiatal hernia;  otherwise normal EGD s/p dilation  . Givens capsule study N/A 06/08/2013    Procedure: GIVENS CAPSULE STUDY;  Surgeon: Daneil Dolin, MD;  Location: [facility];  Service: Endoscopy;  Laterality: N/A;  . Esophagogastroduodenoscopy N/A 06/13/2013    Dr. Oneida Alar- see enteroscopy  . Enteroscopy N/A 06/13/2013  [id number] Gastritis/GI BLEED MOST LIKELY DUE TO DUODENAL ULCERS, AND ? AVMs  . Transthoracic echocardiogram  10/06/2012    EF 55-60%, mod eccentric hypertrophy, grade 2 diastolic dysfunction; mildly calcifed AV annulus with moderate regurg; aortic root mildly dilated; LA severely dailted; PA peak pressure 59mHg  . Nm myocar perf wall motion  03/27/2010    dipyridamole; small reversible basal to mid-septal defect (?artifact), post-stress EF 55%, low risk scan   . Hemorrhoid banding      Current Outpatient Prescriptions  Medication Sig Dispense Refill  . albuterol (PROVENTIL HFA;VENTOLIN HFA) 108 (90 BASE) MCG/ACT inhaler Inhale 1-2 puffs into the lungs every 6 (six) hours as needed for wheezing. 1 Inhaler 0  . clopidogrel (PLAVIX) 75 MG tablet  Take 75 mg by mouth daily with breakfast.    . furosemide (LASIX) 20 MG tablet Take 1 tablet (20 mg total) by mouth daily. 30 tablet 1  . gabapentin (NEURONTIN) 100 MG capsule Take 2 capsules (200 mg total) by mouth 3 (three) times daily. 180 capsule 2  . hydrocortisone (ANUSOL-HC) 2.5 % rectal cream Apply rectally 2 times daily (Patient taking differently: Place 1 application rectally 2 (two) times daily as needed for hemorrhoids or itching. Apply rectally 2 times daily) 30 g 1  . Multiple Vitamins-Minerals (OPTI-VITAMINS PO) Take 1 tablet by mouth daily.    . ondansetron (ZOFRAN) 4 MG tablet Take 1 tablet (4 mg total) by mouth every 8 (eight) hours as needed for nausea or vomiting. (Patient taking differently: Take 4 mg by mouth every morning. *MAY TAKE AS NEEDED DAILY BUT TAKES ONE TABLET IN THE MORNING) 30 tablet 0  . pantoprazole (PROTONIX) 40 MG tablet Take 1 tablet (40 mg total) by mouth 2 (two) times daily. 60 tablet 11  . prednisoLONE acetate (PRED FORTE) 1 % ophthalmic suspension 1 drop 4 (four) times daily.    . psyllium (METAMUCIL) 58.6 % packet Take 1 packet by mouth 2 (two) times daily.    . sodium chloride (MURO 128) 2 % ophthalmic solution Place 1 drop into both eyes 3 (three) times daily.     . traMADol (ULTRAM) 50 MG tablet Take 1 tablet (50 mg total) by mouth every 4 (four) hours as needed. 120 tablet 2  . dextromethorphan-guaiFENesin (MUCINEX DM) 30-600 MG per 12 hr tablet Take 1 tablet by mouth 2 (two) times daily as needed for cough. (Patient not taking: Reported on 05/16/2015) 14 tablet 0   No current facility-administered medications for this visit.    Allergies as of 05/16/2015 - Review Complete 05/16/2015  Allergen Reaction Noted  . Codeine Diarrhea and Nausea Only   . Iron Nausea And Vomiting 02/01/2011  . Asa [aspirin] Rash and Other (See Comments) 07/27/2013  . Penicillins Rash     Family History  Problem Relation Age of Onset  . Colon cancer Neg Hx   . Breast  cancer Daughter     also hyperlipidemia  . Breast cancer Daughter     also hyperlipidemia  . Asthma Mother   . Multiple sclerosis Child   . Hyperlipidemia Child     Social History   Social History  . Marital Status: Widowed    Spouse Name: N/A  . Number of Children: 5  . Years of Education: 6   Occupational History  .     Social History Main Topics  . Smoking status: Former Smoker -- 1.50 packs/day for 20 years    Types: Cigarettes    Quit date: 07/05/1975  .  Smokeless tobacco: Never Used     Comment: Quit smoking in 1975  . Alcohol Use: No  . Drug Use: No  . Sexual Activity: No   Other Topics Concern  . None   Social History Narrative    Review of Systems: As mentioned in HPI   Physical Exam: BP 133/64 mmHg  Pulse 81  Temp(Src) 97.3 F (36.3 C)  Ht 5\' 6"  (1.676 m)  Wt 167 lb 9.6 oz (76.023 kg)  BMI 27.06 kg/m2 General:   Alert and oriented. No distress noted. Pleasant and cooperative.  Head:  Normocephalic and atraumatic. Eyes:  Conjuctiva clear without scleral icterus. Mouth:  Oral mucosa pink and moist. Good dentition. No lesions. Abdomen:  +BS, soft, non-tender and non-distended. No rebound or guarding. No HSM or masses noted. Msk:  Symmetrical without gross deformities. Normal posture. Extremities:  Without edema. Neurologic:  Alert and  oriented x4;  grossly normal neurologically. Psych:  Alert and cooperative. Normal mood and affect.

## 2015-05-17 ENCOUNTER — Telehealth: Payer: Self-pay

## 2015-05-17 NOTE — Telephone Encounter (Signed)
Continue using the cream if they will not cover the suppositories. Can call the pharmacy and cancel the Rx.

## 2015-05-17 NOTE — Telephone Encounter (Signed)
Noted and she is aware

## 2015-05-17 NOTE — Telephone Encounter (Signed)
Pt's daughter is calling because her insurance will not cover the suppositories. She already has the cream that we have gave her before. Please advise

## 2015-05-18 NOTE — Assessment & Plan Note (Addendum)
Likely multifactorial in setting of straining, history of hemorrhoids, possible ?rectal prolapse/friable rectal mucosa. However, last colonoscopy was in 2012, and we discussed undergoing repeat colonoscopy due to persistent rectal bleeding despite supportive measures. She is currently being evaluated for left groin lymphadenopathy and would rather wait until this is completed. In interim, continue Metamucil, anusol cream BID X 7 days and prn, and contact us when she would like to proceed. Daughter knowledgeable about plan of care and present for entire visit.

## 2015-05-21 NOTE — Progress Notes (Signed)
cc'ed to pcp °

## 2015-05-22 ENCOUNTER — Other Ambulatory Visit (HOSPITAL_COMMUNITY): Payer: Self-pay | Admitting: General Surgery

## 2015-05-22 DIAGNOSIS — R591 Generalized enlarged lymph nodes: Secondary | ICD-10-CM

## 2015-05-22 DIAGNOSIS — R590 Localized enlarged lymph nodes: Secondary | ICD-10-CM | POA: Diagnosis not present

## 2015-05-25 DIAGNOSIS — H04121 Dry eye syndrome of right lacrimal gland: Secondary | ICD-10-CM | POA: Diagnosis not present

## 2015-05-31 ENCOUNTER — Ambulatory Visit (HOSPITAL_COMMUNITY)
Admission: RE | Admit: 2015-05-31 | Discharge: 2015-05-31 | Disposition: A | Discharge status: Home / Self Care | Payer: Medicare Other | Source: Ambulatory Visit | Attending: General Surgery | Admitting: General Surgery

## 2015-05-31 ENCOUNTER — Other Ambulatory Visit (HOSPITAL_COMMUNITY): Payer: Self-pay | Admitting: General Surgery

## 2015-05-31 DIAGNOSIS — R591 Generalized enlarged lymph nodes: Secondary | ICD-10-CM | POA: Diagnosis not present

## 2015-05-31 DIAGNOSIS — R1909 Other intra-abdominal and pelvic swelling, mass and lump: Secondary | ICD-10-CM | POA: Diagnosis not present

## 2015-05-31 MED ORDER — LIDOCAINE HCL (PF) 2 % IJ SOLN
INTRAMUSCULAR | Status: AC
  Filled 2015-05-31: qty 10

## 2015-06-11 ENCOUNTER — Other Ambulatory Visit: Payer: Self-pay | Admitting: *Deleted

## 2015-06-11 DIAGNOSIS — M48061 Spinal stenosis, lumbar region without neurogenic claudication: Secondary | ICD-10-CM

## 2015-06-11 DIAGNOSIS — M25552 Pain in left hip: Secondary | ICD-10-CM

## 2015-06-11 DIAGNOSIS — M5416 Radiculopathy, lumbar region: Secondary | ICD-10-CM

## 2015-06-11 MED ORDER — GABAPENTIN 100 MG PO CAPS: 200.0000 mg | ORAL_CAPSULE | Freq: Three times a day (TID) | ORAL | Status: DC

## 2015-06-12 ENCOUNTER — Ambulatory Visit (INDEPENDENT_AMBULATORY_CARE_PROVIDER_SITE_OTHER): Payer: Medicare Other | Admitting: Orthopedic Surgery

## 2015-06-12 ENCOUNTER — Encounter: Payer: Self-pay | Admitting: Orthopedic Surgery

## 2015-06-12 ENCOUNTER — Ambulatory Visit (INDEPENDENT_AMBULATORY_CARE_PROVIDER_SITE_OTHER): Payer: Medicare Other

## 2015-06-12 VITALS — BP 134/59 | Ht 66.0 in | Wt 167.0 lb

## 2015-06-12 DIAGNOSIS — M1990 Unspecified osteoarthritis, unspecified site: Secondary | ICD-10-CM

## 2015-06-12 DIAGNOSIS — M129 Arthropathy, unspecified: Secondary | ICD-10-CM | POA: Diagnosis not present

## 2015-06-12 DIAGNOSIS — Z96651 Presence of right artificial knee joint: Secondary | ICD-10-CM | POA: Diagnosis not present

## 2015-06-12 DIAGNOSIS — M1710 Unilateral primary osteoarthritis, unspecified knee: Secondary | ICD-10-CM

## 2015-06-12 DIAGNOSIS — M1611 Unilateral primary osteoarthritis, right hip: Secondary | ICD-10-CM

## 2015-06-12 NOTE — Patient Instructions (Signed)
We will schedule repeat Rt hip joint injection and call you with appt

## 2015-06-12 NOTE — Progress Notes (Signed)
Patient ID: [name] [name], female   DOB: 05-06-1923, 81 y.o.   MRN: [id number]  Follow up visit  Chief Complaint  Patient presents with  . Follow-up    Yearly recheck on right knee replacement with xray, DOS 05-19-06.    BP 134/59 mmHg  Ht 5\' 6"  (1.676 m)  Wt 167 lb (75.751 kg)  BMI 26.97 kg/m2  Encounter Diagnoses  Name Primary?  . Status post total right knee replacement Yes  . Arthritis of right hip     She has 2 complaints today 1. This is her 9 year follow up on her right total knee which is functioning well  She also has arthritis of the right hip and would like an injection in the joint  The hip pain was previously worked up she's found to be arthritic with arthritis but we gave her an injection she did well we don't think she is a great surgical candidate  As far as her knee goes she is doing well with that although she is a walker for the hip pain she also has degenerative disc disease L-spine which is stable but does interfere with gait  System review otherwise negative other than the right hip pain in the lower back pain  Knee looks good flexion greater than 100 settling in about 115 looks to have full extension good strength in the quadriceps skin incision intact no redness or erythema. She's not gait is supported by walker her appearance is normal she is oriented 3 her mood is pleasant  Recommend right hip joint injection  Follow-up in a year regarding her knee replacement for x-ray

## 2015-06-13 NOTE — Addendum Note (Signed)
Addended by: Baldomero Lamy B on: 06/13/2015 02:58 PM   Modules accepted: Orders

## 2015-06-15 ENCOUNTER — Other Ambulatory Visit (HOSPITAL_COMMUNITY): Payer: Medicare Other

## 2015-06-19 DIAGNOSIS — J209 Acute bronchitis, unspecified: Secondary | ICD-10-CM | POA: Diagnosis not present

## 2015-06-19 DIAGNOSIS — E6609 Other obesity due to excess calories: Secondary | ICD-10-CM | POA: Diagnosis not present

## 2015-06-19 DIAGNOSIS — Z6833 Body mass index (BMI) 33.0-33.9, adult: Secondary | ICD-10-CM | POA: Diagnosis not present

## 2015-06-19 DIAGNOSIS — Z1389 Encounter for screening for other disorder: Secondary | ICD-10-CM | POA: Diagnosis not present

## 2015-06-19 DIAGNOSIS — N390 Urinary tract infection, site not specified: Secondary | ICD-10-CM | POA: Diagnosis not present

## 2015-06-21 ENCOUNTER — Ambulatory Visit (HOSPITAL_COMMUNITY)
Admission: RE | Admit: 2015-06-21 | Discharge: 2015-06-21 | Disposition: A | Discharge status: Home / Self Care | Payer: Medicare Other | Source: Ambulatory Visit | Attending: Orthopedic Surgery | Admitting: Orthopedic Surgery

## 2015-06-21 DIAGNOSIS — M25551 Pain in right hip: Secondary | ICD-10-CM | POA: Diagnosis not present

## 2015-06-21 DIAGNOSIS — M1611 Unilateral primary osteoarthritis, right hip: Secondary | ICD-10-CM | POA: Insufficient documentation

## 2015-06-21 MED ORDER — IOHEXOL 300 MG/ML  SOLN
50.0000 mL | Freq: Once | INTRAMUSCULAR | Status: DC | PRN
  Administered 2015-06-21: 2 mL via INTRAVENOUS
  Filled 2015-06-21: qty 50

## 2015-06-21 MED ORDER — LIDOCAINE HCL (PF) 1 % IJ SOLN
INTRAMUSCULAR | Status: AC
  Filled 2015-06-21: qty 15

## 2015-06-21 MED ORDER — POVIDONE-IODINE 10 % EX SOLN
CUTANEOUS | Status: AC
  Filled 2015-06-21: qty 15

## 2015-06-21 NOTE — Procedures (Signed)
Preprocedure Dx: Arthritis RIGHT hip, RIGHT hip pain Postprocedure Dx: Arthritis RIGHT hip, RIGHT hip pain Procedure  Fluoroscopically guided therapeutic RIGHT joint injection Radiologist:  Thornton Papas Anesthesia:  4 ml of 1% lidocaine Injectate:  40 mg Solu-Medrol, 5 ml of 1% lidocaine Fluoro time:  1 minutes 54 seconds EBL:   < 1 ml Complications: None

## 2015-07-02 DIAGNOSIS — R319 Hematuria, unspecified: Secondary | ICD-10-CM | POA: Diagnosis not present

## 2015-08-08 ENCOUNTER — Other Ambulatory Visit: Payer: Self-pay | Admitting: Orthopedic Surgery

## 2015-08-13 ENCOUNTER — Other Ambulatory Visit: Payer: Self-pay | Admitting: Orthopedic Surgery

## 2015-08-13 ENCOUNTER — Other Ambulatory Visit: Payer: Self-pay | Admitting: *Deleted

## 2015-08-13 MED ORDER — TRAMADOL HCL 50 MG PO TABS: 50.0000 mg | ORAL_TABLET | ORAL | Status: DC | PRN

## 2015-08-13 NOTE — Telephone Encounter (Signed)
Patient's daughter and designated contact Joaquim Lai, ph#'s [phone] or [phone], request refill on medication Tramadol from [place] request evidently was sent several days ago; I relayed to patient's daughter to also ask pharmacy to fax request manually.  Please advise at either of these phone #'s.

## 2015-08-24 ENCOUNTER — Telehealth: Payer: Self-pay | Admitting: Internal Medicine

## 2015-08-24 NOTE — Telephone Encounter (Signed)
Agreed -

## 2015-08-24 NOTE — Telephone Encounter (Signed)
Daughter is aware of OV with RMR on 1/24 at 9am

## 2015-08-24 NOTE — Telephone Encounter (Signed)
Spoke with the pts daughterHelene Kelp, she said the pt has been having a small amount of rectal bleeding all week. Yesterday she noticed the bleeding has gotten worse and seems to be a whole lot more than usual. She not c/o any pain. She has been using the suppositories. The daughter didn't know if it was bright red blood or not, she also didn't know if she has been straining to have a bm. Pt is getting worried about it and they want to know if there is anything they can do.

## 2015-08-24 NOTE — Telephone Encounter (Signed)
Pt's daughter, Sherre Scarlet, called to see if her mother could be seen today. I told her the next opening would be 8am next Thursday. She said that the patient had been having a little rectal bleeding all week, but now it's a lot and she is filling the commode up and doesn't know what to do. Please advise and call daughter at [phone]

## 2015-08-24 NOTE — Telephone Encounter (Signed)
Patient needs to present to the ED if she is having significant rectal bleeding. She has multiple comorbidities and is on Plavix, I believe.

## 2015-08-24 NOTE — Telephone Encounter (Signed)
pts daughter is aware. She said she may not be able to get her to go. I advised her that if she refuses to go, they should keep an eye on her and if she develops weakness, dizziness, pain, or more bleeding she needed to go to the ED asap. Daughter agreed. She would also like an appt to be seen whenever we can see her.   Please schedule ov.

## 2015-08-27 DIAGNOSIS — R319 Hematuria, unspecified: Secondary | ICD-10-CM | POA: Diagnosis not present

## 2015-08-28 ENCOUNTER — Encounter: Payer: Self-pay | Admitting: Internal Medicine

## 2015-08-28 ENCOUNTER — Ambulatory Visit (INDEPENDENT_AMBULATORY_CARE_PROVIDER_SITE_OTHER): Payer: Medicare Other | Admitting: Internal Medicine

## 2015-08-28 VITALS — BP 123/47 | HR 68 | Temp 98.2°F | Ht 66.0 in | Wt 168.4 lb

## 2015-08-28 DIAGNOSIS — K5900 Constipation, unspecified: Secondary | ICD-10-CM

## 2015-08-28 DIAGNOSIS — K625 Hemorrhage of anus and rectum: Secondary | ICD-10-CM

## 2015-08-28 DIAGNOSIS — K219 Gastro-esophageal reflux disease without esophagitis: Secondary | ICD-10-CM

## 2015-08-28 NOTE — Progress Notes (Signed)
Primary Care Physician:  Rocky Morel, MD Primary Gastroenterologist:  Dr. Gala Romney  Pre-Procedure History & Physical: HPI:  [name] [name] is a 82 y.o. female here for recent rectal bleeding.  Patient reports a bowel movement once daily to every other day to twice every other day. Denies straining. Spends 2 - 5 minutes on the toilet. Takes Metamucil daily. Also, states she takes a stool softener each morning.  Status post hemorrhoid banding.  Anusol suppositories helped. Over the past week bleeding has tapered off. Hemoglobin 12.3  3 months ago. Rectal prolapse noted on prior colonoscopy. Status post hemorrhoid banding.   She remains on Plavix.  Past Medical History  Diagnosis Date  . History of blood clots     in leg  . History of knee surgery   . Mini stroke ([place])   . Hip fracture ([place])     hip surgery 2001  . Stomach ulcer     secondary to h.pylori, s/p treatment  . Thyroid condition   . Bladder infection     h/o  . Kidney infection     h/o  . Melanoma of thigh ([place])     left  . Aneurysm, thoracic aortic ([place])   . Dehydration     HISTORY   . S/P colonoscopy March 2010    RMR: friable anal canal hemorrhoids, hyperplastic ascending polyp, adenomatous descending polyp   . Fall at home 09/10/12  . Melanoma of skin ([place]) 03/14/2013  . DCIS (ductal carcinoma in situ) of breast 03/14/2013    right breast  . Iron deficiency anemia, unspecified 03/14/2013    secondary to GI blood loss  . Ascending aortic aneurysm ([place])     CT in 2012 - 4.2x4.2cm  . Aortic insufficiency   . Venous stasis     edema  . Hypertension   . Hemorrhoids     Past Surgical History  Procedure Laterality Date  . Partial hysterectomy  1976  . Leg surgery    . Breast lumpectomy  1998  . Appendectomy  1942  . Cataract extraction, bilateral    . Melanoma excision Left 08/2006    left leg excision  . Tonsillectomy    . Cholecystectomy    . Varicose vein surgery    . Esophagogastroduodenoscopy   06/05/11    small hiatal hernia; + H.PYLORI GASTRITIS, s/p 5 days of Pylera, unable to finish due to N/V  . Colonoscopy  06/05/11    pancolonic diverticulosis/ileal reosion/abnormal anorectal junction s/p biopsy: path for small intestine and Ti was benign with non-villous atrophy, rectal biopsy with prominent prolapse changes, no acute inflammation  . Corneal transplant Bilateral 2013  . Knee surgery Right 05/2006    total right  . Total hip arthroplasty Left 2001&2004     X 2 FOR LEFT HIP  . Esophagogastroduodenoscopy (egd) with esophageal dilation N/A 06/08/2013    [id number] hiatal hernia;  otherwise normal EGD s/p dilation  . Givens capsule study N/A 06/08/2013    Procedure: GIVENS CAPSULE STUDY;  Surgeon: Daneil Dolin, MD;  Location: [facility];  Service: Endoscopy;  Laterality: N/A;  . Esophagogastroduodenoscopy N/A 06/13/2013    Dr. Oneida Alar- see enteroscopy  . Enteroscopy N/A 06/13/2013    [id number] Gastritis/GI BLEED MOST LIKELY DUE TO DUODENAL ULCERS, AND ? AVMs  . Transthoracic echocardiogram  10/06/2012    EF 55-60%, mod eccentric hypertrophy, grade 2 diastolic dysfunction; mildly calcifed AV annulus with moderate regurg; aortic root mildly dilated; LA severely dailted;  PA peak pressure 47mHg  . Nm myocar perf wall motion  03/27/2010    dipyridamole; small reversible basal to mid-septal defect (?artifact), post-stress EF 55%, low risk scan   . Hemorrhoid banding      Prior to Admission medications   Medication Sig Start Date End Date Taking? Authorizing Provider  albuterol (PROVENTIL HFA;VENTOLIN HFA) 108 (90 BASE) MCG/ACT inhaler Inhale 1-2 puffs into the lungs every 6 (six) hours as needed for wheezing. 04/21/15  Yes Tanna Furry, MD  clopidogrel (PLAVIX) 75 MG tablet Take 75 mg by mouth daily with breakfast.   Yes Historical Provider, MD  furosemide (LASIX) 20 MG tablet Take 1 tablet (20 mg total) by mouth daily. 03/13/14  Yes Nat Christen, MD  gabapentin (NEURONTIN) 100 MG capsule  Take 2 capsules (200 mg total) by mouth 3 (three) times daily. 06/11/15  Yes Carole Civil, MD  hydrocortisone (ANUSOL-HC) 2.5 % rectal cream Apply rectally 2 times daily Patient taking differently: Place 1 application rectally 2 (two) times daily as needed for hemorrhoids or itching. Apply rectally 2 times daily 04/05/14  Yes Veryl Speak, MD  hydrocortisone (ANUSOL-HC) 25 MG suppository Place 1 suppository (25 mg total) rectally every 12 (twelve) hours. 05/16/15  Yes Orvil Feil, NP  Multiple Vitamins-Minerals (OPTI-VITAMINS PO) Take 1 tablet by mouth daily.   Yes Historical Provider, MD  ondansetron (ZOFRAN) 4 MG tablet Take 1 tablet (4 mg total) by mouth every 8 (eight) hours as needed for nausea or vomiting. Patient taking differently: Take 4 mg by mouth every morning. *MAY TAKE AS NEEDED DAILY BUT TAKES ONE TABLET IN THE MORNING 09/13/13  Yes Mahala Menghini, PA-C  pantoprazole (PROTONIX) 40 MG tablet Take 1 tablet (40 mg total) by mouth 2 (two) times daily. 02/09/15  Yes Orvil Feil, NP  prednisoLONE acetate (PRED FORTE) 1 % ophthalmic suspension 1 drop 4 (four) times daily.   Yes Historical Provider, MD  psyllium (METAMUCIL) 58.6 % packet Take 1 packet by mouth 2 (two) times daily.   Yes Historical Provider, MD  sodium chloride (MURO 128) 2 % ophthalmic solution Place 1 drop into both eyes 3 (three) times daily.    Yes Historical Provider, MD  traMADol (ULTRAM) 50 MG tablet Take 1 tablet (50 mg total) by mouth every 4 (four) hours as needed. 08/13/15  Yes Carole Civil, MD    Allergies as of 08/28/2015 - Review Complete 08/28/2015  Allergen Reaction Noted  . Codeine Diarrhea and Nausea Only   . Iron Nausea And Vomiting 02/01/2011  . Asa [aspirin] Rash and Other (See Comments) 07/27/2013  . Penicillins Rash     Family History  Problem Relation Age of Onset  . Colon cancer Neg Hx   . Breast cancer Daughter     also hyperlipidemia  . Breast cancer Daughter     also hyperlipidemia    . Asthma Mother   . Multiple sclerosis Child   . Hyperlipidemia Child     Social History   Social History  . Marital Status: Widowed    Spouse Name: N/A  . Number of Children: 5  . Years of Education: 6   Occupational History  .     Social History Main Topics  . Smoking status: Former Smoker -- 1.50 packs/day for 20 years    Types: Cigarettes    Quit date: 07/05/1975  . Smokeless tobacco: Never Used     Comment: Quit smoking in 1975  . Alcohol Use: No  .  Drug Use: No  . Sexual Activity: No   Other Topics Concern  . Not on file   Social History Narrative    Review of Systems: See HPI, otherwise negative ROS  Physical Exam: BP 123/47 mmHg  Pulse 68  Temp(Src) 98.2 F (36.8 C) (Oral)  Ht 5\' 6"  (1.676 m)  Wt 168 lb 6.4 oz (76.386 kg)  BMI 27.19 kg/m2 General:   Alert,  pleasant and cooperative in NAD; heart-appearing ambulates with a walker. Accompanied by her daughter Skin:  Intact without significant lesions or rashes. Neck:  Supple; no masses or thyromegaly. No significant cervical adenopathy. Lungs:  Clear throughout to auscultation.   No wheezes, crackles, or rhonchi. No acute distress. Heart:  Regular rate and rhythm; no murmurs, clicks, rubs,  or gallops. Abdomen: Non-distended, normal bowel sounds.  Soft and nontender without appreciable mass or hepatosplenomegaly.  Pulses:  Normal pulses noted. Extremities:  Without clubbing or edema. Rectal: Small redundant perianal skin tags. Digital exam no mass nontender. Scant brown stool Hemoccult negative  Impression:  Very pleasant 82 year old lady whose birthday is today being evaluated for low volume paper hematochezia which seems to be tapered off recently. Some tendency towards constipation. Given her workup previously,  I suspect she has an element of prolapse and bleeding is related to benign anorectal disease in the the setting of ongoing Plavix and mild constipation. The possibility of more significant  pathology remaining occult in her lower GI tract always remains a possibility as discussed with the patient's daughterr.  GERD symptoms well controlled on twice a day Protonix - I feel she may do just as well with once daily dosing.  Recommendations:  Continue Metamucil daily  Continue stool softener daily  Miralax 1/2 capful daily as needed for constipation  H and H today  Telephone progress report in 2 weeks  Take Protonix 40 mg once daily  Office visit in 3 months   Notice: This dictation was prepared with Dragon dictation along with smaller phrase technology. Any transcriptional errors that result from this process are unintentional and may not be corrected upon review.

## 2015-08-28 NOTE — Patient Instructions (Signed)
Continue Metamucil daily  Continue stool softener daily  Miralax 1/2 capful daily as needed for slow bowels  H and H today  Telephone progress report in 2 weeks  Take Protonix 40 mg once daily  Office visit in 3 months

## 2015-08-29 ENCOUNTER — Other Ambulatory Visit: Payer: Self-pay | Admitting: Internal Medicine

## 2015-08-29 DIAGNOSIS — K625 Hemorrhage of anus and rectum: Secondary | ICD-10-CM | POA: Diagnosis not present

## 2015-08-30 ENCOUNTER — Telehealth: Payer: Self-pay

## 2015-08-30 LAB — HEMATOCRIT: Hematocrit: 34.3 % (ref 34.0–46.6)

## 2015-08-30 LAB — HEMOGLOBIN: Hemoglobin: 11.2 g/dL (ref 11.1–15.9)

## 2015-08-30 NOTE — Telephone Encounter (Signed)
Noted  

## 2015-08-30 NOTE — Telephone Encounter (Signed)
Results from [place] placed on RMR desk for review

## 2015-08-30 NOTE — Telephone Encounter (Signed)
They are in the computer and addressed already

## 2015-08-31 ENCOUNTER — Other Ambulatory Visit (HOSPITAL_COMMUNITY): Payer: Self-pay | Admitting: Family Medicine

## 2015-08-31 DIAGNOSIS — Z1231 Encounter for screening mammogram for malignant neoplasm of breast: Secondary | ICD-10-CM

## 2015-09-05 IMAGING — CT CT ENTEROGRAPHY (ABD-PELV W/ CM)
2 of 4 series · 16 of 46 positions shown, 18 images · IV contrast (Omnipaque 300)
Comparison: None.

CLINICAL DATA: Small bowel polyp on capsule endoscopy

EXAM:
CT ABDOMEN AND PELVIS WITH CONTRAST (ENTEROGRAPHY)
TECHNIQUE: Multidetector CT of the abdomen and pelvis during bolus
administration of intravenous contrast. Negative oral contrast
VoLumen was given.
CONTRAST:  125mL OMNIPAQUE IOHEXOL 300 MG/ML  SOLN

[Series 3: abd_pel 2.0 b40f · axial · 0.97mm/px · z∈[-428,-48]mm · 13 of 214 slices shown, 15 images]
[im 16/214  soft-tissue]
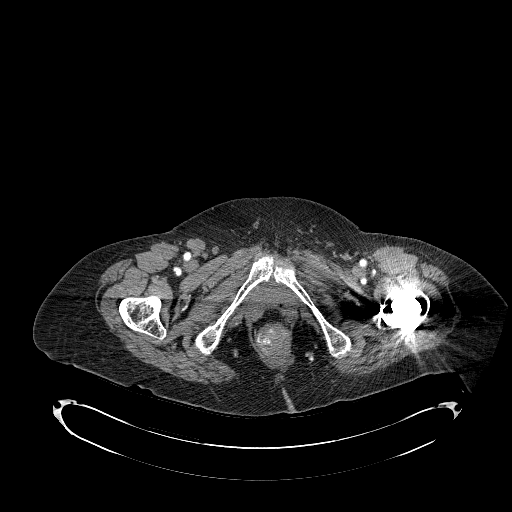
[im 16/214  bone]
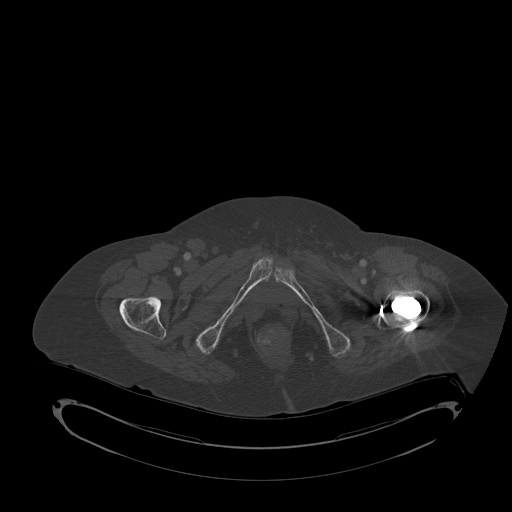
[im 32/214  soft-tissue]
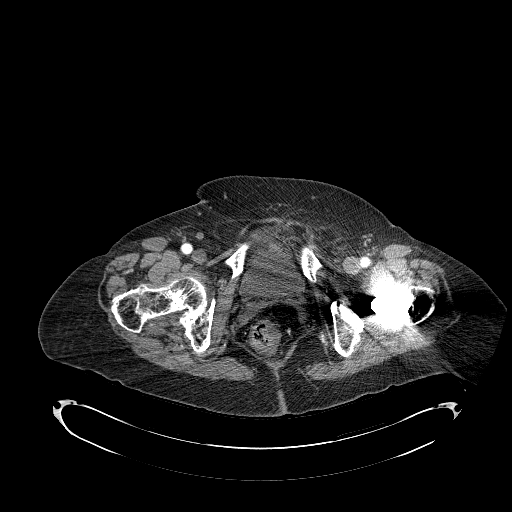
[im 48/214  soft-tissue]
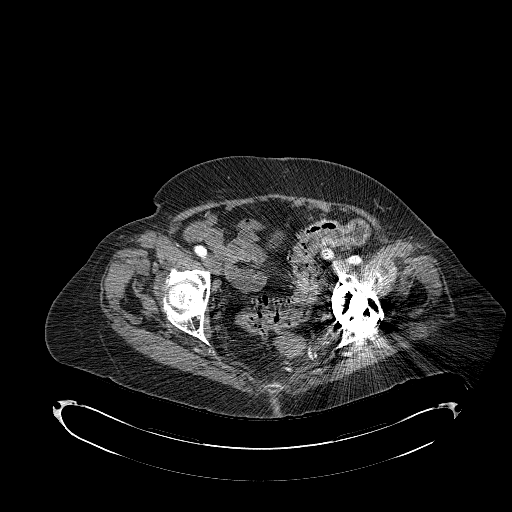
[im 64/214  soft-tissue]
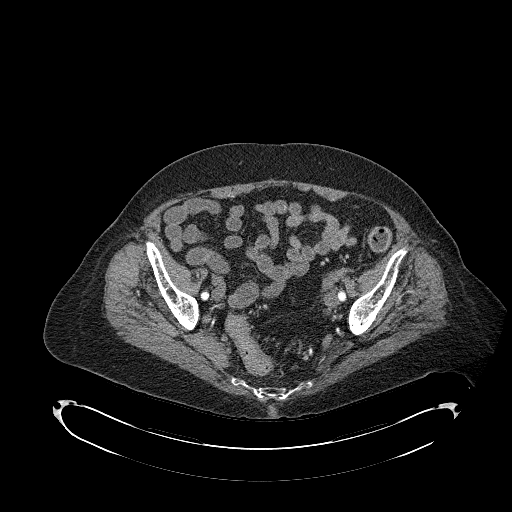
[im 79/214  soft-tissue]
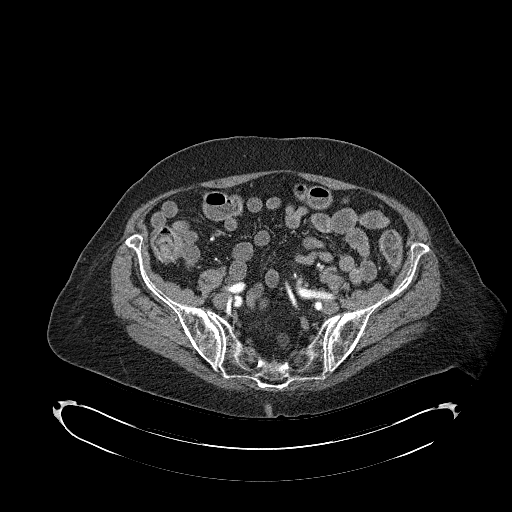
[im 95/214  soft-tissue]
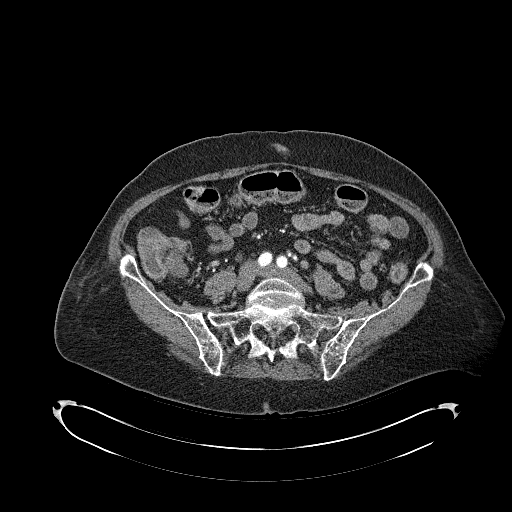
[im 111/214  soft-tissue]
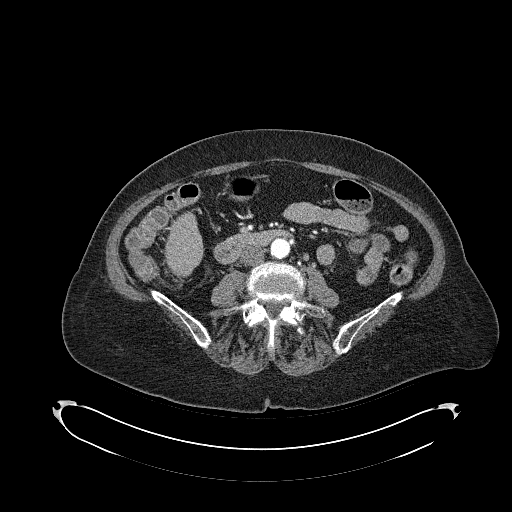
[im 127/214  soft-tissue]
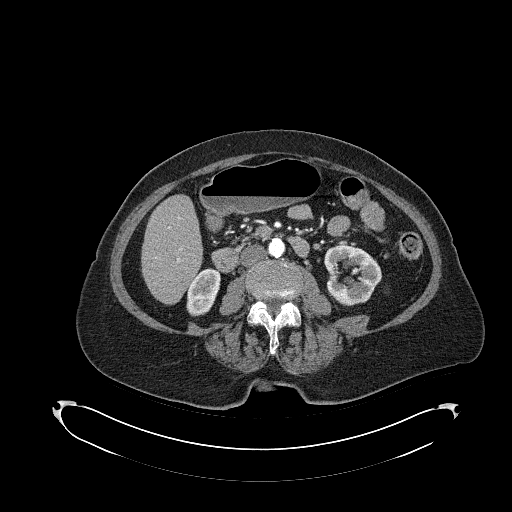
[im 143/214  soft-tissue]
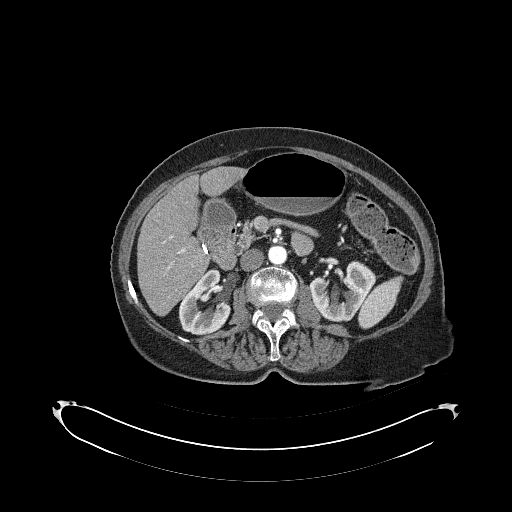
[im 143/214  bone]
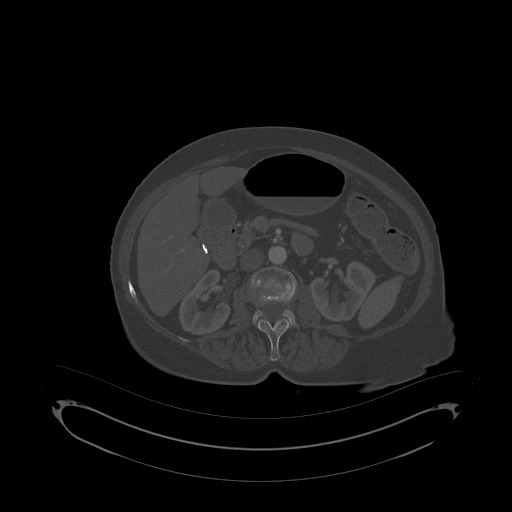
[im 158/214  soft-tissue]
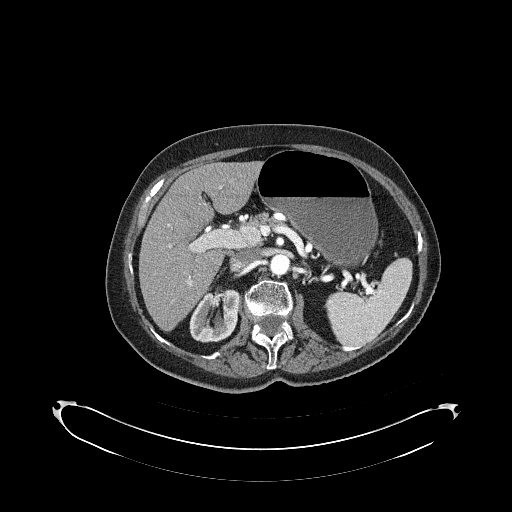
[im 174/214  soft-tissue]
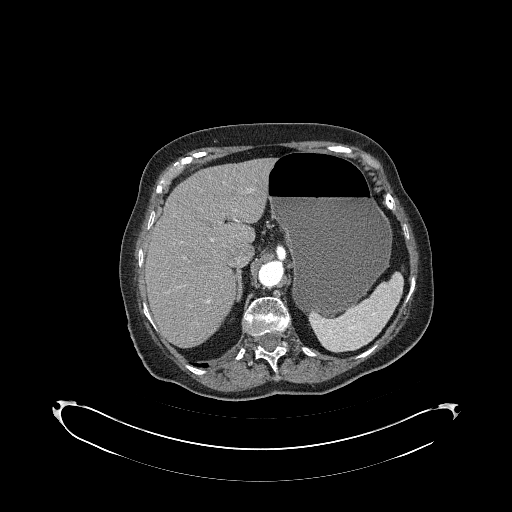
[im 190/214  soft-tissue]
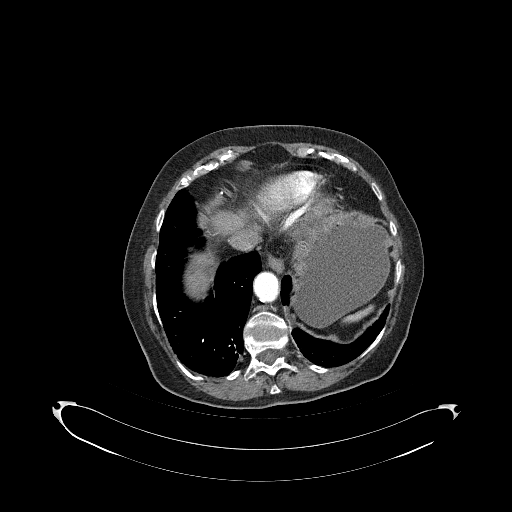
[im 206/214  soft-tissue]
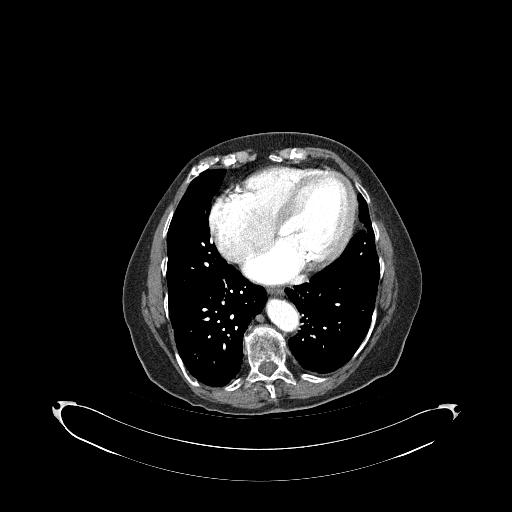

[Series 5: mpr cor post contrast (id) · coronal · 0.92mm/px · 3 of 102 slices shown]
[im 34/102  soft-tissue]
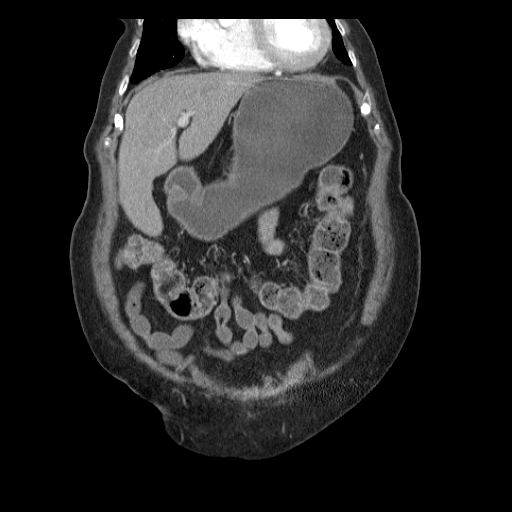
[im 45/102  soft-tissue]
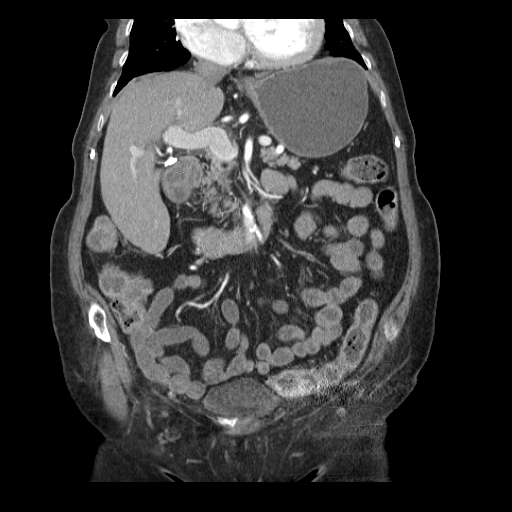
[im 57/102  soft-tissue]
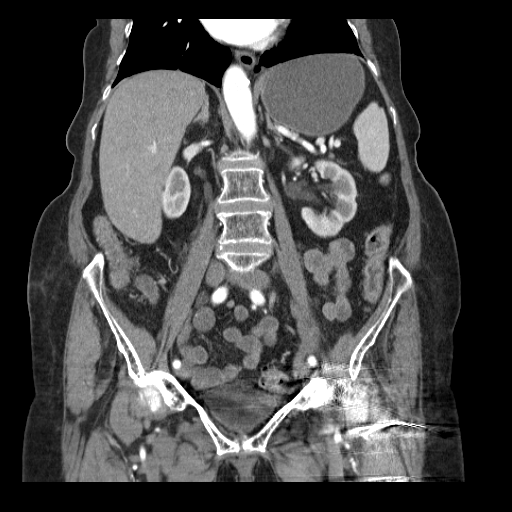

[16 of 46 positions shown; findings below may reference images not displayed]

FINDINGS: Subsegmental atelectasis or scarring is seen in the lung bases. The
heart is enlarged.

Stomach is normal without wall thickening or abnormal mural
enhancement. Duodenum has normal imaging features. Jejunal diameter
is normal. There is no abnormal mural thickening or enhancement in
the proximal small bowel. Distal small bowel loops similarly have
normal diameter, wall thickness, and enhancement. No hyper enhancing
small bowel loops.

Distal and terminal ileum is well visualized. No polypoid lesions
are evident within the lumen of the terminal ileum. Although the
ileum is not substantially it is distended by the enteric contrast,
no small bowel polyps are evident. No abnormal wall thickening or
enhancement in the right and transverse colon. Diffuse
diverticulosis noted in the left colon without diverticulitis. This
mainly involves the sigmoid segment.

No focal abnormality is seen in the liver or spleen. The pancreas
and adrenal glands are normal in appearance. The gallbladder is
surgically absent. 8 mm enhancing lesion is identified in the
interpolar right kidney (see image 70 of series 3 and image 21 of
series [DATE] cm water density lesion in the interpolar left kidney
is compatible with a cyst.

No free fluid or lymphadenopathy in the abdomen.

Imaging through the pelvis has fine detail of the lower left
anatomic pelvis obscured by streak artifact from the left hip
replacement. No evidence for intraperitoneal free fluid. No pelvic
sidewall lymphadenopathy. The bladder is unremarkable. The uterus is
surgically absent. No evidence for adnexal mass.

Bone windows reveal no worrisome lytic or sclerotic osseous lesions.
IMPRESSION: The questioned small bowel polyps on recent capsule endoscopy are
not visualized on today's CT enterography. No evidence for wall
thickening or abnormal enhancement in the stomach or small bowel.
Diverticular changes are seen in the left colon without
diverticulitis.

8 mm enhancing lesion in the right kidney. This is concerning for a
small renal neoplasm. Abdominal MRI without and with contrast is
recommended for more definitive characterization. All

## 2015-09-17 DIAGNOSIS — Z1389 Encounter for screening for other disorder: Secondary | ICD-10-CM | POA: Diagnosis not present

## 2015-09-17 DIAGNOSIS — M1611 Unilateral primary osteoarthritis, right hip: Secondary | ICD-10-CM | POA: Diagnosis not present

## 2015-09-17 DIAGNOSIS — Z6832 Body mass index (BMI) 32.0-32.9, adult: Secondary | ICD-10-CM | POA: Diagnosis not present

## 2015-09-19 ENCOUNTER — Ambulatory Visit (HOSPITAL_COMMUNITY)
Admission: RE | Admit: 2015-09-19 | Discharge: 2015-09-19 | Disposition: A | Discharge status: Home / Self Care | Payer: Medicare Other | Source: Ambulatory Visit | Attending: Family Medicine | Admitting: Family Medicine

## 2015-09-19 DIAGNOSIS — Z1231 Encounter for screening mammogram for malignant neoplasm of breast: Secondary | ICD-10-CM | POA: Insufficient documentation

## 2015-10-01 ENCOUNTER — Ambulatory Visit: Payer: Medicare Other | Admitting: Internal Medicine

## 2015-10-08 ENCOUNTER — Encounter: Payer: Self-pay | Admitting: Cardiovascular Disease

## 2015-10-08 ENCOUNTER — Ambulatory Visit (INDEPENDENT_AMBULATORY_CARE_PROVIDER_SITE_OTHER): Payer: Medicare Other | Admitting: Cardiovascular Disease

## 2015-10-08 VITALS — BP 126/76 | HR 73 | Ht 66.0 in | Wt 167.0 lb

## 2015-10-08 DIAGNOSIS — I712 Thoracic aortic aneurysm, without rupture: Secondary | ICD-10-CM | POA: Diagnosis not present

## 2015-10-08 DIAGNOSIS — I351 Nonrheumatic aortic (valve) insufficiency: Secondary | ICD-10-CM

## 2015-10-08 DIAGNOSIS — I7121 Aneurysm of the ascending aorta, without rupture: Secondary | ICD-10-CM

## 2015-10-08 NOTE — Progress Notes (Signed)
Patient ID: [name] [name], female   DOB: 11-28-1922, 82 y.o.   MRN: [id number]      SUBJECTIVE: The patient is a 82 year old woman who I am meeting for the first time. She saw Dr. Debara Pickett last year. She is here to follow-up for aortic insufficiency an ascending thoracic aortic aneurysm.  Echocardiogram performed on 10/05/14 demonstrated normal left ventricular systolic function, EF 0000000, borderline restrictive physiology with increased filling pressures, severe left atrial enlargement, mild mitral regurgitation, mild aortic regurgitation, and mildly dilated ascending aortic root , 40 mm.  Given her age, she does not want any intervention if any is needed in the future regarding this aneurysm.  She reportedly has a history of a TIA and takes Plavix as she is intolerant of aspirin.  She is doing well and denies exertional chest discomfort and dyspnea. She has bilateral venous varicosities. She has acid reflux alleviated with grapefruit juice and vinegar.   Review of Systems: As per "subjective", otherwise negative.  Allergies  Allergen Reactions  . Codeine Diarrhea and Nausea Only  . Iron Nausea And Vomiting    Oral iron causes nausea and vomiting  . Asa [Aspirin] Rash and Other (See Comments)    Petechiae   . Penicillins Rash    Current Outpatient Prescriptions  Medication Sig Dispense Refill  . albuterol (PROVENTIL HFA;VENTOLIN HFA) 108 (90 BASE) MCG/ACT inhaler Inhale 1-2 puffs into the lungs every 6 (six) hours as needed for wheezing. 1 Inhaler 0  . clopidogrel (PLAVIX) 75 MG tablet Take 75 mg by mouth daily with breakfast.    . furosemide (LASIX) 20 MG tablet Take 1 tablet (20 mg total) by mouth daily. 30 tablet 1  . gabapentin (NEURONTIN) 100 MG capsule Take 2 capsules (200 mg total) by mouth 3 (three) times daily. 180 capsule 2  . hydrocortisone (ANUSOL-HC) 2.5 % rectal cream Apply rectally 2 times daily (Patient taking differently: Place 1 application rectally 2 (two) times  daily as needed for hemorrhoids or itching. Apply rectally 2 times daily) 30 g 1  . hydrocortisone (ANUSOL-HC) 25 MG suppository Place 1 suppository (25 mg total) rectally every 12 (twelve) hours. 12 suppository 1  . Multiple Vitamins-Minerals (OPTI-VITAMINS PO) Take 1 tablet by mouth daily.    . ondansetron (ZOFRAN) 4 MG tablet Take 1 tablet (4 mg total) by mouth every 8 (eight) hours as needed for nausea or vomiting. (Patient taking differently: Take 4 mg by mouth every morning. *MAY TAKE AS NEEDED DAILY BUT TAKES ONE TABLET IN THE MORNING) 30 tablet 0  . pantoprazole (PROTONIX) 40 MG tablet Take 1 tablet (40 mg total) by mouth 2 (two) times daily. 60 tablet 11  . prednisoLONE acetate (PRED FORTE) 1 % ophthalmic suspension 1 drop 4 (four) times daily.    . psyllium (METAMUCIL) 58.6 % packet Take 1 packet by mouth 2 (two) times daily.    . sodium chloride (MURO 128) 2 % ophthalmic solution Place 1 drop into both eyes 3 (three) times daily.     . traMADol (ULTRAM) 50 MG tablet Take 1 tablet (50 mg total) by mouth every 4 (four) hours as needed. 120 tablet 2   No current facility-administered medications for this visit.    Past Medical History  Diagnosis Date  . History of blood clots     in leg  . History of knee surgery   . Mini stroke ([place])   . Hip fracture ([place])     hip surgery 2001  . Stomach ulcer  secondary to h.pylori, s/p treatment  . Thyroid condition   . Bladder infection     h/o  . Kidney infection     h/o  . Melanoma of thigh ([place])     left  . Aneurysm, thoracic aortic ([place])   . Dehydration     HISTORY   . S/P colonoscopy March 2010    RMR: friable anal canal hemorrhoids, hyperplastic ascending polyp, adenomatous descending polyp   . Fall at home 09/10/12  . Melanoma of skin ([place]) 03/14/2013  . DCIS (ductal carcinoma in situ) of breast 03/14/2013    right breast  . Iron deficiency anemia, unspecified 03/14/2013    secondary to GI blood loss  . Ascending aortic  aneurysm ([place])     CT in 2012 - 4.2x4.2cm  . Aortic insufficiency   . Venous stasis     edema  . Hypertension   . Hemorrhoids     Past Surgical History  Procedure Laterality Date  . Partial hysterectomy  1976  . Leg surgery    . Breast lumpectomy  1998  . Appendectomy  1942  . Cataract extraction, bilateral    . Melanoma excision Left 08/2006    left leg excision  . Tonsillectomy    . Cholecystectomy    . Varicose vein surgery    . Esophagogastroduodenoscopy  06/05/11    small hiatal hernia; + H.PYLORI GASTRITIS, s/p 5 days of Pylera, unable to finish due to N/V  . Colonoscopy  06/05/11    pancolonic diverticulosis/ileal reosion/abnormal anorectal junction s/p biopsy: path for small intestine and Ti was benign with non-villous atrophy, rectal biopsy with prominent prolapse changes, no acute inflammation  . Corneal transplant Bilateral 2013  . Knee surgery Right 05/2006    total right  . Total hip arthroplasty Left 2001&2004     X 2 FOR LEFT HIP  . Esophagogastroduodenoscopy (egd) with esophageal dilation N/A 06/08/2013    [id number] hiatal hernia;  otherwise normal EGD s/p dilation  . Givens capsule study N/A 06/08/2013    Procedure: GIVENS CAPSULE STUDY;  Surgeon: Daneil Dolin, MD;  Location: [facility];  Service: Endoscopy;  Laterality: N/A;  . Esophagogastroduodenoscopy N/A 06/13/2013    Dr. Oneida Alar- see enteroscopy  . Enteroscopy N/A 06/13/2013    [id number] Gastritis/GI BLEED MOST LIKELY DUE TO DUODENAL ULCERS, AND ? AVMs  . Transthoracic echocardiogram  10/06/2012    EF 55-60%, mod eccentric hypertrophy, grade 2 diastolic dysfunction; mildly calcifed AV annulus with moderate regurg; aortic root mildly dilated; LA severely dailted; PA peak pressure 24mHg  . Nm myocar perf wall motion  03/27/2010    dipyridamole; small reversible basal to mid-septal defect (?artifact), post-stress EF 55%, low risk scan   . Hemorrhoid banding      Social History   Social History  . Marital  Status: Widowed    Spouse Name: N/A  . Number of Children: 5  . Years of Education: 6   Occupational History  .     Social History Main Topics  . Smoking status: Former Smoker -- 1.50 packs/day for 20 years    Types: Cigarettes    Quit date: 07/05/1975  . Smokeless tobacco: Never Used     Comment: Quit smoking in 1975  . Alcohol Use: No  . Drug Use: No  . Sexual Activity: No   Other Topics Concern  . Not on file   Social History Narrative     Filed Vitals:   10/08/15 1406  BP: 126/76  Pulse: 73  Height: 5\' 6"  (1.676 m)  Weight: 167 lb (75.751 kg)  SpO2: 94%    PHYSICAL EXAM General: NAD HEENT: Normal. Neck: No JVD, no thyromegaly. Lungs: Clear to auscultation bilaterally with normal respiratory effort. CV: Nondisplaced PMI.  Regular rate and rhythm, normal S1/S2, no [id number], 1/6 systolic murmur over RUSB. No pretibial or periankle edema. Bilateral venous varicosities. Abdomen: Soft, nontender, no distention.  Neurologic: Alert and oriented.  Psych: Normal affect. Skin: Normal. Musculoskeletal: No gross deformities.  ECG: Most recent ECG reviewed.      ASSESSMENT AND PLAN: 1. Mild aortic root dilatation/thoracic aortic aneurysm with mild aortic regurgitation: Appeared to be stable at last echo in 09/2014. Given her age she does not want any interventions performed if one is needed in the future. At this time, no further testing is warranted.  Dispo: f/u prn.  Kate Sable, M.D., F.A.C.C.

## 2015-10-08 NOTE — Patient Instructions (Signed)
Your physician recommends that you schedule a follow-up appointment in:  As needed        Thank you for choosing [facility] !         

## 2015-10-19 ENCOUNTER — Ambulatory Visit (INDEPENDENT_AMBULATORY_CARE_PROVIDER_SITE_OTHER): Payer: Medicare Other | Admitting: Urology

## 2015-10-19 ENCOUNTER — Other Ambulatory Visit: Payer: Self-pay | Admitting: Urology

## 2015-10-19 DIAGNOSIS — C641 Malignant neoplasm of right kidney, except renal pelvis: Secondary | ICD-10-CM | POA: Diagnosis not present

## 2015-10-23 ENCOUNTER — Ambulatory Visit (HOSPITAL_COMMUNITY)
Admission: RE | Admit: 2015-10-23 | Discharge: 2015-10-23 | Disposition: A | Discharge status: Home / Self Care | Payer: Medicare Other | Source: Ambulatory Visit | Attending: Urology | Admitting: Urology

## 2015-10-23 DIAGNOSIS — N281 Cyst of kidney, acquired: Secondary | ICD-10-CM | POA: Insufficient documentation

## 2015-10-23 DIAGNOSIS — C641 Malignant neoplasm of right kidney, except renal pelvis: Secondary | ICD-10-CM | POA: Diagnosis not present

## 2015-10-23 DIAGNOSIS — N2889 Other specified disorders of kidney and ureter: Secondary | ICD-10-CM | POA: Insufficient documentation

## 2015-10-29 ENCOUNTER — Encounter: Payer: Self-pay | Admitting: Internal Medicine

## 2015-10-31 ENCOUNTER — Other Ambulatory Visit: Payer: Self-pay | Admitting: Urology

## 2015-10-31 DIAGNOSIS — C641 Malignant neoplasm of right kidney, except renal pelvis: Secondary | ICD-10-CM

## 2015-11-06 ENCOUNTER — Ambulatory Visit (HOSPITAL_COMMUNITY)
Admission: RE | Admit: 2015-11-06 | Discharge: 2015-11-06 | Disposition: A | Discharge status: Home / Self Care | Payer: Medicare Other | Source: Ambulatory Visit | Attending: Urology | Admitting: Urology

## 2015-11-06 DIAGNOSIS — R160 Hepatomegaly, not elsewhere classified: Secondary | ICD-10-CM | POA: Diagnosis not present

## 2015-11-06 DIAGNOSIS — C641 Malignant neoplasm of right kidney, except renal pelvis: Secondary | ICD-10-CM | POA: Diagnosis not present

## 2015-11-06 DIAGNOSIS — I2510 Atherosclerotic heart disease of native coronary artery without angina pectoris: Secondary | ICD-10-CM | POA: Diagnosis not present

## 2015-11-06 DIAGNOSIS — I712 Thoracic aortic aneurysm, without rupture: Secondary | ICD-10-CM | POA: Diagnosis not present

## 2015-11-06 LAB — POCT I-STAT CREATININE: CREATININE: 1.1 mg/dL — AB (ref 0.44–1.00)

## 2015-11-06 MED ORDER — IOPAMIDOL (ISOVUE-300) INJECTION 61%
100.0000 mL | Freq: Once | INTRAVENOUS | Status: AC | PRN
  Administered 2015-11-06: 100 mL via INTRAVENOUS

## 2015-11-07 DIAGNOSIS — E6609 Other obesity due to excess calories: Secondary | ICD-10-CM | POA: Diagnosis not present

## 2015-11-07 DIAGNOSIS — Z1389 Encounter for screening for other disorder: Secondary | ICD-10-CM | POA: Diagnosis not present

## 2015-11-07 DIAGNOSIS — Z0000 Encounter for general adult medical examination without abnormal findings: Secondary | ICD-10-CM | POA: Diagnosis not present

## 2015-11-07 DIAGNOSIS — Z6833 Body mass index (BMI) 33.0-33.9, adult: Secondary | ICD-10-CM | POA: Diagnosis not present

## 2015-11-23 ENCOUNTER — Ambulatory Visit (INDEPENDENT_AMBULATORY_CARE_PROVIDER_SITE_OTHER): Payer: Medicare Other | Admitting: Urology

## 2015-11-23 DIAGNOSIS — C641 Malignant neoplasm of right kidney, except renal pelvis: Secondary | ICD-10-CM

## 2015-12-10 DIAGNOSIS — M1991 Primary osteoarthritis, unspecified site: Secondary | ICD-10-CM | POA: Diagnosis not present

## 2015-12-10 DIAGNOSIS — Z1389 Encounter for screening for other disorder: Secondary | ICD-10-CM | POA: Diagnosis not present

## 2015-12-10 DIAGNOSIS — R5383 Other fatigue: Secondary | ICD-10-CM | POA: Diagnosis not present

## 2015-12-10 DIAGNOSIS — Z6834 Body mass index (BMI) 34.0-34.9, adult: Secondary | ICD-10-CM | POA: Diagnosis not present

## 2015-12-10 DIAGNOSIS — N342 Other urethritis: Secondary | ICD-10-CM | POA: Diagnosis not present

## 2015-12-10 DIAGNOSIS — E6609 Other obesity due to excess calories: Secondary | ICD-10-CM | POA: Diagnosis not present

## 2015-12-10 DIAGNOSIS — D649 Anemia, unspecified: Secondary | ICD-10-CM | POA: Diagnosis not present

## 2015-12-11 DIAGNOSIS — Z79899 Other long term (current) drug therapy: Secondary | ICD-10-CM | POA: Diagnosis not present

## 2015-12-11 DIAGNOSIS — D509 Iron deficiency anemia, unspecified: Secondary | ICD-10-CM | POA: Diagnosis not present

## 2015-12-14 ENCOUNTER — Other Ambulatory Visit (HOSPITAL_COMMUNITY): Payer: Self-pay | Admitting: Oncology

## 2015-12-14 DIAGNOSIS — D509 Iron deficiency anemia, unspecified: Secondary | ICD-10-CM

## 2015-12-18 ENCOUNTER — Encounter (HOSPITAL_COMMUNITY): Payer: Self-pay

## 2015-12-18 ENCOUNTER — Encounter (HOSPITAL_COMMUNITY): Payer: Medicare Other | Attending: Hematology & Oncology

## 2015-12-18 VITALS — BP 144/66 | HR 83 | Temp 98.3°F | Resp 18

## 2015-12-18 DIAGNOSIS — D509 Iron deficiency anemia, unspecified: Secondary | ICD-10-CM

## 2015-12-18 MED ORDER — SODIUM CHLORIDE 0.9 % IV SOLN
510.0000 mg | Freq: Once | INTRAVENOUS | Status: AC
  Administered 2015-12-18: 510 mg via INTRAVENOUS
  Filled 2015-12-18: qty 17

## 2015-12-18 MED ORDER — SODIUM CHLORIDE 0.9 % IV SOLN
INTRAVENOUS | Status: DC
  Administered 2015-12-18: 13:00:00 via INTRAVENOUS

## 2015-12-18 NOTE — Patient Instructions (Signed)
[place] at [facility] Discharge Instructions  RECOMMENDATIONS MADE BY THE CONSULTANT AND ANY TEST RESULTS WILL BE SENT TO YOUR REFERRING PHYSICIAN.    Iron infusion today Follow up as scheduled   Thank you for choosing [place] at [facility] to provide your oncology and hematology care.  To afford each patient quality time with our provider, please arrive at least 15 minutes before your scheduled appointment time.   Beginning January 23rd 2017 lab work for the [facility] will be done in the  Main lab at [facility] on 1st floor. If you have a lab appointment with the [facility] please come in thru the  Main Entrance and check in at the main information desk  You need to re-schedule your appointment should you arrive 10 or more minutes late.  We strive to give you quality time with our providers, and arriving late affects you and other patients whose appointments are after yours.  Also, if you no show three or more times for appointments you may be dismissed from the clinic at the providers discretion.     Again, thank you for choosing [facility].  Our hope is that these requests will decrease the amount of time that you wait before being seen by our physicians.       _____________________________________________________________  Should you have questions after your visit to [facility], please contact our office at [phone] between the hours of 8:30 a.m. and 4:30 p.m.  Voicemails left after 4:30 p.m. will not be returned until the following business day.  For prescription refill requests, have your pharmacy contact our office.         Resources For Cancer Patients and their Caregivers ? American Cancer Society: Can assist with transportation, wigs, general needs, runs Look Good Feel Better.        (512)712-6475 ? Cancer Care: Provides financial assistance, online support groups,  medication/co-pay assistance.  1-800-813-HOPE 514-056-0628) ? [place] Assists [place] cancer patients and their families through emotional , educational and financial support.  502-090-3794 ? [place] DSS Where to apply for food stamps, Medicaid and utility assistance. (918)381-7433 ? [facility]: Transportation to medical appointments. 410-316-7992 ? Social Security Administration: May apply for disability if have a Stage IV cancer. 416-454-4665 215-720-4148 ? [employer], Disability and Transit Services: Assists with nutrition, care and transit needs. [place] Support Programs: @10RELATIVEDAYS @ > Cancer Support Group  2nd Tuesday of the month 1pm-2pm, Journey Room  > Creative Journey  3rd Tuesday of the month 1130am-1pm, Journey Room  > Look Good Feel Better  1st Wednesday of the month 10am-12 noon, Journey Room (Call [place] to register [phone])

## 2015-12-18 NOTE — Progress Notes (Signed)
[name] [name] Tolerated iron infusion well today  Discharged via wheelchair after 30 minute watch period

## 2016-01-11 DIAGNOSIS — H3530 Unspecified macular degeneration: Secondary | ICD-10-CM | POA: Diagnosis not present

## 2016-01-11 DIAGNOSIS — H524 Presbyopia: Secondary | ICD-10-CM | POA: Diagnosis not present

## 2016-01-11 DIAGNOSIS — H52223 Regular astigmatism, bilateral: Secondary | ICD-10-CM | POA: Diagnosis not present

## 2016-01-11 DIAGNOSIS — H5203 Hypermetropia, bilateral: Secondary | ICD-10-CM | POA: Diagnosis not present

## 2016-01-29 DIAGNOSIS — Z85820 Personal history of malignant melanoma of skin: Secondary | ICD-10-CM | POA: Diagnosis not present

## 2016-01-29 DIAGNOSIS — L570 Actinic keratosis: Secondary | ICD-10-CM | POA: Diagnosis not present

## 2016-01-29 DIAGNOSIS — D485 Neoplasm of uncertain behavior of skin: Secondary | ICD-10-CM | POA: Diagnosis not present

## 2016-04-03 ENCOUNTER — Ambulatory Visit (HOSPITAL_COMMUNITY): Payer: Medicare Other | Admitting: Hematology & Oncology

## 2016-04-16 DIAGNOSIS — N342 Other urethritis: Secondary | ICD-10-CM | POA: Diagnosis not present

## 2016-04-16 DIAGNOSIS — Z6834 Body mass index (BMI) 34.0-34.9, adult: Secondary | ICD-10-CM | POA: Diagnosis not present

## 2016-04-16 DIAGNOSIS — R600 Localized edema: Secondary | ICD-10-CM | POA: Diagnosis not present

## 2016-04-16 DIAGNOSIS — K229 Disease of esophagus, unspecified: Secondary | ICD-10-CM | POA: Diagnosis not present

## 2016-04-16 DIAGNOSIS — E6609 Other obesity due to excess calories: Secondary | ICD-10-CM | POA: Diagnosis not present

## 2016-04-16 DIAGNOSIS — Z23 Encounter for immunization: Secondary | ICD-10-CM | POA: Diagnosis not present

## 2016-04-19 ENCOUNTER — Encounter (HOSPITAL_COMMUNITY): Payer: Self-pay | Admitting: Emergency Medicine

## 2016-04-19 ENCOUNTER — Emergency Department (HOSPITAL_COMMUNITY)
Admission: EM | Admit: 2016-04-19 | Discharge: 2016-04-19 | Disposition: A | Discharge status: Home / Self Care | Payer: Medicare Other | Attending: Emergency Medicine | Admitting: Emergency Medicine

## 2016-04-19 DIAGNOSIS — I10 Essential (primary) hypertension: Secondary | ICD-10-CM | POA: Diagnosis not present

## 2016-04-19 DIAGNOSIS — L03116 Cellulitis of left lower limb: Secondary | ICD-10-CM | POA: Insufficient documentation

## 2016-04-19 DIAGNOSIS — Z79899 Other long term (current) drug therapy: Secondary | ICD-10-CM | POA: Insufficient documentation

## 2016-04-19 DIAGNOSIS — Z87891 Personal history of nicotine dependence: Secondary | ICD-10-CM | POA: Insufficient documentation

## 2016-04-19 DIAGNOSIS — M7989 Other specified soft tissue disorders: Secondary | ICD-10-CM | POA: Diagnosis present

## 2016-04-19 MED ORDER — CEFTRIAXONE SODIUM 1 G IJ SOLR
1.0000 g | Freq: Once | INTRAMUSCULAR | Status: AC
  Administered 2016-04-19: 1 g via INTRAMUSCULAR
  Filled 2016-04-19: qty 10

## 2016-04-19 MED ORDER — LIDOCAINE HCL (PF) 1 % IJ SOLN
INTRAMUSCULAR | Status: AC
  Administered 2016-04-19: 2.1 mL
  Filled 2016-04-19: qty 5

## 2016-04-19 MED ORDER — CEPHALEXIN 500 MG PO CAPS: 500.0000 mg | ORAL_CAPSULE | Freq: Four times a day (QID) | ORAL | 0 refills | Status: DC

## 2016-04-19 NOTE — ED Provider Notes (Signed)
[facility] DEPT Provider Note   CSN: [id number] Arrival date & time: 04/19/16  1658     History   Chief Complaint Chief Complaint  Patient presents with  . Leg Swelling    HPI [name] [name] is a 82 y.o. female.  Patient is a 82 year old female with history of aortic insufficiency and aneurysm of the thoracic aorta. She presents for evaluation of left leg pain and swelling. Her legs have been swollen for "a while", but began to hurt 2 days ago. She described it as a burning sensation around her ankle. It began turning red earlier this afternoon. She denies any fevers or chills. She denies any chest pain or shortness of breath.      Past Medical History:  Diagnosis Date  . Aneurysm, thoracic aortic ([place])   . Aortic insufficiency   . Ascending aortic aneurysm ([place])    CT in 2012 - 4.2x4.2cm  . Bladder infection    h/o  . 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 Dehydration    HISTORY   . Fall at home 09/10/12  . Hemorrhoids   . Hip fracture ([place])    hip surgery 2001  . History of blood clots    in leg  . History of knee surgery   . Hypertension   . Iron deficiency anemia, unspecified 03/14/2013   secondary to GI blood loss  . Kidney infection    h/o  . Melanoma of skin ([place]) 03/14/2013  . Melanoma of thigh ([place])    left  . Mini stroke ([place])   . S/P colonoscopy March 2010   RMR: friable anal canal hemorrhoids, hyperplastic ascending polyp, adenomatous descending polyp   . Stomach ulcer    secondary to h.pylori, s/p treatment  . Thyroid condition   . Venous stasis    edema    Patient Active Problem List   Diagnosis Date Noted  . Rectal bleeding 05/16/2015  . Constipation 02/09/2015  . Lumbago 04/27/2014  . Hemorrhoids 08/23/2013  . Occult GI bleeding 08/23/2013  . Early satiety 06/07/2013  . Loss of weight 06/07/2013  . Globus sensation 06/07/2013  . IDA (iron deficiency anemia) 06/07/2013  . Melanoma of skin ([place])  03/14/2013  . DCIS (ductal carcinoma in situ) of breast 03/14/2013  . Iron deficiency anemia, unspecified 03/14/2013  . BPPV (benign paroxysmal positional vertigo) 02/10/2013  . RBBB 02/10/2013  . Ascending aortic aneurysm ([place]) 02/10/2013  . Aortic insufficiency 02/10/2013  . Hip pain 11/02/2012  . S/P total knee replacement 08/14/2011  . Helicobacter pylori gastritis 07/14/2011  . Anemia 02/04/2011  . Bladder infection   . BURSITIS, HIP 05/29/2009  . LOWER LEG, ARTHRITIS, DEGEN./OSTEO 11/23/2007  . HIP PAIN 06/07/2007  . PAIN IN JOINT, LOWER LEG 06/07/2007    Past Surgical History:  Procedure Laterality Date  . APPENDECTOMY  1942  . BREAST LUMPECTOMY  1998  . CATARACT EXTRACTION, BILATERAL    . CHOLECYSTECTOMY    . COLONOSCOPY  06/05/11   pancolonic diverticulosis/ileal reosion/abnormal anorectal junction s/p biopsy: path for small intestine and Ti was benign with non-villous atrophy, rectal biopsy with prominent prolapse changes, no acute inflammation  . CORNEAL TRANSPLANT Bilateral 2013  . ENTEROSCOPY N/A 06/13/2013   HYI:FOYD Gastritis/GI BLEED MOST LIKELY DUE TO DUODENAL ULCERS, AND ? AVMs  . ESOPHAGOGASTRODUODENOSCOPY  06/05/11   small hiatal hernia; + H.PYLORI GASTRITIS, s/p 5 days of Pylera, unable to finish due to N/V  . ESOPHAGOGASTRODUODENOSCOPY N/A 06/13/2013  Dr. Oneida Alar- see enteroscopy  . ESOPHAGOGASTRODUODENOSCOPY (EGD) WITH ESOPHAGEAL DILATION N/A 06/08/2013   GYJ:EHUDJ hiatal hernia;  otherwise normal EGD s/p dilation  . GIVENS CAPSULE STUDY N/A 06/08/2013   Procedure: GIVENS CAPSULE STUDY;  Surgeon: Daneil Dolin, MD;  Location: [facility];  Service: Endoscopy;  Laterality: N/A;  . HEMORRHOID BANDING    . KNEE SURGERY Right 05/2006   total right  . LEG SURGERY    . MELANOMA EXCISION Left 08/2006   left leg excision  . NM MYOCAR PERF WALL MOTION  03/27/2010   dipyridamole; small reversible basal to mid-septal defect (?artifact), post-stress EF 55%, low  risk scan   . PARTIAL HYSTERECTOMY  1976  . TONSILLECTOMY    . TOTAL HIP ARTHROPLASTY Left 2001&2004    X 2 FOR LEFT HIP  . TRANSTHORACIC ECHOCARDIOGRAM  10/06/2012   EF 55-60%, mod eccentric hypertrophy, grade 2 diastolic dysfunction; mildly calcifed AV annulus with moderate regurg; aortic root mildly dilated; LA severely dailted; PA peak pressure 26mHg  . VARICOSE VEIN SURGERY      OB History    No data available       Home Medications    Prior to Admission medications   Medication Sig Start Date End Date Taking? Authorizing Provider  albuterol (PROVENTIL HFA;VENTOLIN HFA) 108 (90 BASE) MCG/ACT inhaler Inhale 1-2 puffs into the lungs every 6 (six) hours as needed for wheezing. 04/21/15   Tanna Furry, MD  clopidogrel (PLAVIX) 75 MG tablet Take 75 mg by mouth daily with breakfast.    Historical Provider, MD  furosemide (LASIX) 20 MG tablet Take 1 tablet (20 mg total) by mouth daily. 03/13/14   Nat Christen, MD  gabapentin (NEURONTIN) 100 MG capsule Take 2 capsules (200 mg total) by mouth 3 (three) times daily. 06/11/15   Carole Civil, MD  hydrocortisone (ANUSOL-HC) 2.5 % rectal cream Apply rectally 2 times daily Patient taking differently: Place 1 application rectally 2 (two) times daily as needed for hemorrhoids or itching. Apply rectally 2 times daily 04/05/14   Veryl Speak, MD  hydrocortisone (ANUSOL-HC) 25 MG suppository Place 1 suppository (25 mg total) rectally every 12 (twelve) hours. 05/16/15   Annitta Needs, NP  Multiple Vitamins-Minerals (OPTI-VITAMINS PO) Take 1 tablet by mouth daily.    Historical Provider, MD  ondansetron (ZOFRAN) 4 MG tablet Take 1 tablet (4 mg total) by mouth every 8 (eight) hours as needed for nausea or vomiting. Patient taking differently: Take 4 mg by mouth every morning. *MAY TAKE AS NEEDED DAILY BUT TAKES ONE TABLET IN THE MORNING 09/13/13   Mahala Menghini, PA-C  pantoprazole (PROTONIX) 40 MG tablet Take 1 tablet (40 mg total) by mouth 2 (two) times  daily. 02/09/15   Annitta Needs, NP  prednisoLONE acetate (PRED FORTE) 1 % ophthalmic suspension 1 drop 4 (four) times daily.    Historical Provider, MD  psyllium (METAMUCIL) 58.6 % packet Take 1 packet by mouth 2 (two) times daily.    Historical Provider, MD  sodium chloride (MURO 128) 2 % ophthalmic solution Place 1 drop into both eyes 3 (three) times daily.     Historical Provider, MD  traMADol (ULTRAM) 50 MG tablet Take 1 tablet (50 mg total) by mouth every 4 (four) hours as needed. 08/13/15   Carole Civil, MD    Family History Family History  Problem Relation Age of Onset  . Breast cancer Daughter     also hyperlipidemia  . Breast cancer Daughter  also hyperlipidemia  . Asthma Mother   . Multiple sclerosis Child   . Hyperlipidemia Child   . Colon cancer Neg Hx     Social History Social History  Substance Use Topics  . Smoking status: Former Smoker    Packs/day: 1.50    Years: 20.00    Types: Cigarettes    Quit date: 07/05/1975  . Smokeless tobacco: Never Used     Comment: Quit smoking in 1975  . Alcohol use No     Allergies   Codeine; Iron; Asa [aspirin]; and Penicillins   Review of Systems Review of Systems  All other systems reviewed and are negative.    Physical Exam Updated Vital Signs BP (!) 160/51 (BP Location: Left Arm)   Pulse 73   Temp 97.8 F (36.6 C)   Resp 20   Ht 5\' 6"  (1.676 m)   Wt 175 lb (79.4 kg)   SpO2 92%   BMI 28.25 kg/m   Physical Exam  Constitutional: She is oriented to person, place, and time. She appears well-developed and well-nourished. No distress.  HENT:  Head: Normocephalic and atraumatic.  Neck: Normal range of motion. Neck supple.  Cardiovascular: Normal rate and regular rhythm.  Exam reveals no gallop and no friction rub.   No murmur heard. Pulmonary/Chest: Effort normal and breath sounds normal. No respiratory distress. She has no wheezes.  Abdominal: Soft. Bowel sounds are normal. She exhibits no distension.  There is no tenderness.  Musculoskeletal: Normal range of motion.  There is redness, warmth to the left anterior lower leg just above the ankle. DP pulses are easily palpable. She is able to flex and extend her toes and sensation is intact to the entire foot.  Neurological: She is alert and oriented to person, place, and time.  Skin: Skin is warm and dry. She is not diaphoretic.  Nursing note and vitals reviewed.    ED Treatments / Results  Labs (all labs ordered are listed, but only abnormal results are displayed) Labs Reviewed - No data to display  EKG  EKG Interpretation None       Radiology No results found.  Procedures Procedures (including critical care time)  Medications Ordered in ED Medications - No data to display   Initial Impression / Assessment and Plan / ED Course  I have reviewed the triage vital signs and the nursing notes.  Pertinent labs & imaging results that were available during my care of the patient were reviewed by me and considered in my medical decision making (see chart for details).  Clinical Course    This appears to be a cellulitis. She has no calf pain and I have a very low suspicion for DVT. She will be treated with Rocephin and discharged with Keflex. She appears clinically well and I see no reason to perform laboratory or imaging studies.  Final Clinical Impressions(s) / ED Diagnoses   Final diagnoses:  None    New Prescriptions New Prescriptions   No medications on file     Veryl Speak, MD 04/19/16 1724

## 2016-04-19 NOTE — ED Triage Notes (Signed)
Pt reports leg swelling that started hurting yesterday and that it started turning red today. Pt reports no recent injury to leg. Pt denies SOB.

## 2016-04-19 NOTE — Discharge Instructions (Signed)
Keflex as prescribed.  Stop taking the nitrofurantoin that was prescribed previously.  Return to the emergency department if you develop worsening redness, worsening pain, high fever, chest pain, or shortness of breath.

## 2016-05-02 ENCOUNTER — Ambulatory Visit (HOSPITAL_COMMUNITY): Payer: Medicare Other | Admitting: Hematology & Oncology

## 2016-05-05 ENCOUNTER — Other Ambulatory Visit: Payer: Self-pay | Admitting: Urology

## 2016-05-05 ENCOUNTER — Ambulatory Visit (HOSPITAL_COMMUNITY): Payer: Medicare Other | Admitting: Hematology & Oncology

## 2016-05-05 DIAGNOSIS — C641 Malignant neoplasm of right kidney, except renal pelvis: Secondary | ICD-10-CM

## 2016-05-06 ENCOUNTER — Other Ambulatory Visit: Payer: Self-pay

## 2016-05-06 ENCOUNTER — Ambulatory Visit (INDEPENDENT_AMBULATORY_CARE_PROVIDER_SITE_OTHER): Payer: Medicare Other | Admitting: Internal Medicine

## 2016-05-06 ENCOUNTER — Telehealth: Payer: Self-pay

## 2016-05-06 ENCOUNTER — Encounter: Payer: Self-pay | Admitting: Internal Medicine

## 2016-05-06 VITALS — BP 140/65 | HR 71 | Temp 98.0°F | Ht 67.0 in | Wt 170.8 lb

## 2016-05-06 DIAGNOSIS — R6881 Early satiety: Secondary | ICD-10-CM

## 2016-05-06 DIAGNOSIS — K219 Gastro-esophageal reflux disease without esophagitis: Secondary | ICD-10-CM

## 2016-05-06 NOTE — Progress Notes (Signed)
Primary Care Physician:  [name], MD Primary Gastroenterologist:  Dr. Gala Romney  Pre-Procedure History & Physical: HPI:  [name] [name] is a 82 y.o. female here for follow-up of GERD and rectal bleeding. She states rectal bleeding/ constipation have tapered off; she still wears a pad to be on the safe side. May have only a slight tinge of dilute of blood on her pad about once weekly. Moves her bowels at least 3 times weekly. Takes Metamucil and Colace. Denies significant constipation. States rectal bleeding even better than it was when she was seen here previously. Reflux symptoms well controlled on Protonix 40 mg twice daily. However, patient and daughter note early satiety over the past several weeks and increased coughing after she eats. Does not clearly describe solid food or esophageal dysphagia.  Continues on Plavix for antiplatelet effect.  Past Medical History:  Diagnosis Date  . Aneurysm, thoracic aortic ([place])   . Aortic insufficiency   . Ascending aortic aneurysm ([place])    CT in 2012 - 4.2x4.2cm  . Bladder infection    h/o  . 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 Dehydration    HISTORY   . Fall at home 09/10/12  . Hemorrhoids   . Hip fracture ([place])    hip surgery 2001  . History of blood clots    in leg  . History of knee surgery   . Hypertension   . Iron deficiency anemia, unspecified 03/14/2013   secondary to GI blood loss  . Kidney infection    h/o  . Melanoma of skin ([place]) 03/14/2013  . Melanoma of thigh ([place])    left  . Mini stroke ([place])   . S/P colonoscopy March 2010   RMR: friable anal canal hemorrhoids, hyperplastic ascending polyp, adenomatous descending polyp   . Stomach ulcer    secondary to h.pylori, s/p treatment  . Thyroid condition   . Venous stasis    edema    Past Surgical History:  Procedure Laterality Date  . APPENDECTOMY  1942  . BREAST LUMPECTOMY  1998  . CATARACT EXTRACTION, BILATERAL    .  CHOLECYSTECTOMY    . COLONOSCOPY  06/05/11   pancolonic diverticulosis/ileal reosion/abnormal anorectal junction s/p biopsy: path for small intestine and Ti was benign with non-villous atrophy, rectal biopsy with prominent prolapse changes, no acute inflammation  . CORNEAL TRANSPLANT Bilateral 2013  . ENTEROSCOPY N/A 06/13/2013   WUJ:WJXB Gastritis/GI BLEED MOST LIKELY DUE TO DUODENAL ULCERS, AND ? AVMs  . ESOPHAGOGASTRODUODENOSCOPY  06/05/11   small hiatal hernia; + H.PYLORI GASTRITIS, s/p 5 days of Pylera, unable to finish due to N/V  . ESOPHAGOGASTRODUODENOSCOPY N/A 06/13/2013   Dr. Oneida Alar- see enteroscopy  . ESOPHAGOGASTRODUODENOSCOPY (EGD) WITH ESOPHAGEAL DILATION N/A 06/08/2013   JYN:WGNFA hiatal hernia;  otherwise normal EGD s/p dilation  . GIVENS CAPSULE STUDY N/A 06/08/2013   Procedure: GIVENS CAPSULE STUDY;  Surgeon: Daneil Dolin, MD;  Location: [facility];  Service: Endoscopy;  Laterality: N/A;  . HEMORRHOID BANDING    . KNEE SURGERY Right 05/2006   total right  . LEG SURGERY    . MELANOMA EXCISION Left 08/2006   left leg excision  . NM MYOCAR PERF WALL MOTION  03/27/2010   dipyridamole; small reversible basal to mid-septal defect (?artifact), post-stress EF 55%, low risk scan   . PARTIAL HYSTERECTOMY  1976  . TONSILLECTOMY    . TOTAL HIP ARTHROPLASTY Left 2001&2004  X 2 FOR LEFT HIP  . TRANSTHORACIC ECHOCARDIOGRAM  10/06/2012   EF 55-60%, mod eccentric hypertrophy, grade 2 diastolic dysfunction; mildly calcifed AV annulus with moderate regurg; aortic root mildly dilated; LA severely dailted; PA peak pressure 30mHg  . VARICOSE VEIN SURGERY      Prior to Admission medications   Medication Sig Start Date End Date Taking? Authorizing Provider  albuterol (PROVENTIL HFA;VENTOLIN HFA) 108 (90 BASE) MCG/ACT inhaler Inhale 1-2 puffs into the lungs every 6 (six) hours as needed for wheezing. 04/21/15  Yes Tanna Furry, MD  clopidogrel (PLAVIX) 75 MG tablet Take 75 mg by mouth daily  with breakfast.   Yes Historical Provider, MD  furosemide (LASIX) 20 MG tablet Take 1 tablet (20 mg total) by mouth daily. 03/13/14  Yes Nat Christen, MD  gabapentin (NEURONTIN) 100 MG capsule Take 2 capsules (200 mg total) by mouth 3 (three) times daily. 06/11/15  Yes Carole Civil, MD  hydrocortisone (ANUSOL-HC) 2.5 % rectal cream Apply rectally 2 times daily Patient taking differently: Place 1 application rectally 2 (two) times daily as needed for hemorrhoids or itching. Apply rectally 2 times daily 04/05/14  Yes Veryl Speak, MD  hydrocortisone (ANUSOL-HC) 25 MG suppository Place 1 suppository (25 mg total) rectally every 12 (twelve) hours. 05/16/15  Yes Annitta Needs, NP  Multiple Vitamins-Minerals (OPTI-VITAMINS PO) Take 1 tablet by mouth daily.   Yes Historical Provider, MD  ondansetron (ZOFRAN) 4 MG tablet Take 1 tablet (4 mg total) by mouth every 8 (eight) hours as needed for nausea or vomiting. Patient taking differently: Take 4 mg by mouth every morning. *MAY TAKE AS NEEDED DAILY BUT TAKES ONE TABLET IN THE MORNING 09/13/13  Yes Mahala Menghini, PA-C  pantoprazole (PROTONIX) 40 MG tablet Take 1 tablet (40 mg total) by mouth 2 (two) times daily. 02/09/15  Yes Annitta Needs, NP  prednisoLONE acetate (PRED FORTE) 1 % ophthalmic suspension 1 drop 4 (four) times daily.   Yes Historical Provider, MD  psyllium (METAMUCIL) 58.6 % packet Take 1 packet by mouth 2 (two) times daily.   Yes Historical Provider, MD  sodium chloride (MURO 128) 2 % ophthalmic solution Place 1 drop into both eyes 3 (three) times daily.    Yes Historical Provider, MD  traMADol (ULTRAM) 50 MG tablet Take 1 tablet (50 mg total) by mouth every 4 (four) hours as needed. 08/13/15  Yes Carole Civil, MD  cephALEXin (KEFLEX) 500 MG capsule Take 1 capsule (500 mg total) by mouth 4 (four) times daily. Patient not taking: Reported on 05/06/2016 04/19/16   Veryl Speak, MD    Allergies as of 05/06/2016 - Review Complete 05/06/2016    Allergen Reaction Noted  . Codeine Diarrhea and Nausea Only   . Iron Nausea And Vomiting 02/01/2011  . Asa [aspirin] Rash and Other (See Comments) 07/27/2013  . Penicillins Rash     Family History  Problem Relation Age of Onset  . Breast cancer Daughter     also hyperlipidemia  . Breast cancer Daughter     also hyperlipidemia  . Asthma Mother   . Multiple sclerosis Child   . Hyperlipidemia Child   . Colon cancer Neg Hx     Social History   Social History  . Marital status: Widowed    Spouse name: N/A  . Number of children: 5  . Years of education: 6   Occupational History  .  Retired   Social History Main Topics  . Smoking status: Former  Smoker    Packs/day: 1.50    Years: 20.00    Types: Cigarettes    Quit date: 07/05/1975  . Smokeless tobacco: Never Used     Comment: Quit smoking in 1975  . Alcohol use No  . Drug use: No  . Sexual activity: No   Other Topics Concern  . Not on file   Social History Narrative  . No narrative on file    Review of Systems: See HPI, otherwise negative ROS  Physical Exam: BP 140/65   Pulse 71   Temp 98 F (36.7 C) (Oral)   Ht 5\' 7"  (1.702 m)   Wt 170 lb 12.8 oz (77.5 kg)   BMI 26.75 kg/m  General:   Alert,  pleasant and cooperative in NAD. Hard of hearing.  Accompanied by her daughter Neck:  Supple; no masses or thyromegaly. No significant cervical adenopathy. Lungs:  Clear throughout to auscultation.   No wheezes, crackles, or rhonchi. No acute distress. Heart:  Regular rate and rhythm; no murmurs, clicks, rubs,  or gallops. Abdomen: Non-distended, normal bowel sounds.  Soft and nontender without appreciable mass or hepatosplenomegaly.  Pulses:  Normal pulses noted. Extremities: 1+ lower tremor edema  Impression:  Pleasant 82 year old lady with GERD well-controlled on twice a day PPI therapy. She appears to require this to maintain remission of her symptoms. Chronic constipation and rectal bleeding. Steady  improvement since hemorrhoid banding. Management of constipation as her major goal here. I explained to her that at least 3 bowel movements weekly was still within the normal range.  She has postprandial coughing and early satiety as a new symptom which warrants further evaluation.  Recommendations:  We talked about upper endoscopy versus a barium pill esophagram/upper GI series. After some discussion, it was mutually agreed it would be best to go with a noninvasive barium contrast study initially.  Barium pill esophagram and UGI series - dysphagia and early satiety   Continue pantoprazole 40 mg twice daily   Avoid constipation as you are doing  Further recommendations to follow        Notice: This dictation was prepared with Dragon dictation along with smaller phrase technology. Any transcriptional errors that result from this process are unintentional and may not be corrected upon review.

## 2016-05-06 NOTE — Telephone Encounter (Signed)
Called pt's daughter Joaquim Lai) and informed of barium pill esophagram and UGI series scheduled for 05/09/16 at 9:00 am at [facility]. NPO after midnight the night before.

## 2016-05-06 NOTE — Patient Instructions (Signed)
Barium pill esophagram and UGI series - dysphagia and early satiety   Continue pantoprazole 40 mg twice daily   Avoid constipation as you are doing  Further recommendations to follow

## 2016-05-09 ENCOUNTER — Ambulatory Visit (HOSPITAL_COMMUNITY)
Admission: RE | Admit: 2016-05-09 | Discharge: 2016-05-09 | Disposition: A | Discharge status: Home / Self Care | Payer: Medicare Other | Source: Ambulatory Visit | Attending: Internal Medicine | Admitting: Internal Medicine

## 2016-05-09 ENCOUNTER — Encounter (HOSPITAL_COMMUNITY): Payer: Self-pay

## 2016-05-09 DIAGNOSIS — K219 Gastro-esophageal reflux disease without esophagitis: Secondary | ICD-10-CM | POA: Diagnosis not present

## 2016-05-09 DIAGNOSIS — K224 Dyskinesia of esophagus: Secondary | ICD-10-CM | POA: Diagnosis not present

## 2016-05-09 DIAGNOSIS — R6881 Early satiety: Secondary | ICD-10-CM | POA: Insufficient documentation

## 2016-05-12 ENCOUNTER — Other Ambulatory Visit: Payer: Self-pay

## 2016-05-12 DIAGNOSIS — R1312 Dysphagia, oropharyngeal phase: Secondary | ICD-10-CM

## 2016-05-13 ENCOUNTER — Ambulatory Visit (HOSPITAL_COMMUNITY)
Admission: RE | Admit: 2016-05-13 | Discharge: 2016-05-13 | Disposition: A | Discharge status: Home / Self Care | Payer: Medicare Other | Source: Ambulatory Visit | Attending: Urology | Admitting: Urology

## 2016-05-13 DIAGNOSIS — C641 Malignant neoplasm of right kidney, except renal pelvis: Secondary | ICD-10-CM | POA: Diagnosis not present

## 2016-05-13 DIAGNOSIS — I700 Atherosclerosis of aorta: Secondary | ICD-10-CM | POA: Insufficient documentation

## 2016-05-13 LAB — POCT I-STAT CREATININE: Creatinine, Ser: 0.9 mg/dL (ref 0.44–1.00)

## 2016-05-13 MED ORDER — GADOBENATE DIMEGLUMINE 529 MG/ML IV SOLN
15.0000 mL | Freq: Once | INTRAVENOUS | Status: AC | PRN
  Administered 2016-05-13: 15 mL via INTRAVENOUS

## 2016-05-15 ENCOUNTER — Encounter (HOSPITAL_COMMUNITY): Payer: Self-pay

## 2016-05-15 ENCOUNTER — Encounter (HOSPITAL_COMMUNITY): Payer: Medicare Other | Attending: Oncology | Admitting: Oncology

## 2016-05-15 VITALS — BP 132/47 | HR 78 | Temp 98.0°F | Resp 16 | Ht 65.0 in | Wt 170.0 lb

## 2016-05-15 DIAGNOSIS — Z85820 Personal history of malignant melanoma of skin: Secondary | ICD-10-CM | POA: Diagnosis not present

## 2016-05-15 DIAGNOSIS — D509 Iron deficiency anemia, unspecified: Secondary | ICD-10-CM

## 2016-05-15 DIAGNOSIS — C439 Malignant melanoma of skin, unspecified: Secondary | ICD-10-CM

## 2016-05-15 DIAGNOSIS — Z853 Personal history of malignant neoplasm of breast: Secondary | ICD-10-CM | POA: Diagnosis not present

## 2016-05-15 NOTE — Progress Notes (Signed)
[name] [name], [place] [id number]  Melanoma of skin [facility]) - Plan: CBC with Differential, Ferritin  Iron deficiency anemia, unspecified iron deficiency anemia type - Plan: CBC with Differential, Ferritin  CURRENT THERAPY: Observation  INTERVAL HISTORY: [name] [name] 82 y.o. female returns for  regular  visit for followup of  Stage I (T1 A.) melanoma the of left thigh,  superficial spreading-type,  thus far without recurrence with resection in December 2007 and re-resection 08/06/2006. She had negative margins at that time.  AND DCIS of the right breast in June 1998 status post surgical resection and she thinks she had radiation therapy as well.  AND Iron deficiency anemia GI blood losses with colonoscopy revealing friable anal canal hemorrhoids and diverticulosis in March 2010 by Dr. Sydell Axon.  The patients has been doing well.  She lives by herself.  She had Bronchitis a couple of weeks ago that was on the "verge of Pneumonia."  She has fully recovered.  She denies any more issues with her skin.  She no longer sees her Dermatologist. She notes that Dr. Romona Curls always "looked her over" for any suspicious skin lesions. Her primary is Dr. Ethlyn Gallery.  Her appetite is well.  Her daughter notes she has decreased hearing.  She still gets mammograms.  She has not had a breast exam in a long time.  She has had eye surgery.  Her Melanoma was located on the L thigh, treated by Dr.Bradford.  She has no concerns at this time.   Patient is here for further follow-up regarding iron deficiency anemia    Weight is stable.  Past Medical History:  Diagnosis Date  . Aneurysm, thoracic aortic ([place])   . Aortic insufficiency   . Ascending aortic aneurysm ([place])    CT in 2012 - 4.2x4.2cm  . Bladder infection    h/o  . 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 Dehydration    HISTORY   . Fall at home 09/10/12  . Hemorrhoids   . Hip fracture  ([place])    hip surgery 2001  . History of blood clots    in leg  . History of knee surgery   . Hypertension   . Iron deficiency anemia, unspecified 03/14/2013   secondary to GI blood loss  . Kidney infection    h/o  . Melanoma of skin ([place]) 03/14/2013  . Melanoma of thigh ([place])    left  . Mini stroke ([place])   . S/P colonoscopy March 2010   RMR: friable anal canal hemorrhoids, hyperplastic ascending polyp, adenomatous descending polyp   . Stomach ulcer    secondary to h.pylori, s/p treatment  . Thyroid condition   . Venous stasis    edema    has LOWER LEG, ARTHRITIS, DEGEN./OSTEO; HIP PAIN; PAIN IN JOINT, LOWER LEG; BURSITIS, HIP; Bladder infection; Anemia; Helicobacter pylori gastritis; S/P total knee replacement; Hip pain; BPPV (benign paroxysmal positional vertigo); RBBB; Ascending aortic aneurysm ([place]); Aortic insufficiency; Melanoma of skin ([facility]); DCIS (ductal carcinoma in situ) of breast; Iron deficiency anemia, unspecified; Early satiety; Loss of weight; Globus sensation; IDA (iron deficiency anemia); Hemorrhoids; Occult GI bleeding; Lumbago; Constipation; and Rectal bleeding on her problem list.     is allergic to codeine; iron; asa [aspirin]; and penicillins.  [name] [name] had no medications administered during this visit.  Past Surgical History:  Procedure Laterality Date  . APPENDECTOMY  1942  . BREAST LUMPECTOMY  1998  . CATARACT EXTRACTION, BILATERAL    . CHOLECYSTECTOMY    . COLONOSCOPY  06/05/11   pancolonic diverticulosis/ileal reosion/abnormal anorectal junction s/p biopsy: path for small intestine and Ti was benign with non-villous atrophy, rectal biopsy with prominent prolapse changes, no acute inflammation  . CORNEAL TRANSPLANT Bilateral 2013  . ENTEROSCOPY N/A 06/13/2013   IPJ:ASNK Gastritis/GI BLEED MOST LIKELY DUE TO DUODENAL ULCERS, AND ? AVMs  . ESOPHAGOGASTRODUODENOSCOPY  06/05/11   small hiatal hernia; + H.PYLORI GASTRITIS, s/p 5 days of Pylera, unable to  finish due to N/V  . ESOPHAGOGASTRODUODENOSCOPY N/A 06/13/2013   Dr. Oneida Alar- see enteroscopy  . ESOPHAGOGASTRODUODENOSCOPY (EGD) WITH ESOPHAGEAL DILATION N/A 06/08/2013   NLZ:JQBHA hiatal hernia;  otherwise normal EGD s/p dilation  . GIVENS CAPSULE STUDY N/A 06/08/2013   Procedure: GIVENS CAPSULE STUDY;  Surgeon: Daneil Dolin, MD;  Location: [facility];  Service: Endoscopy;  Laterality: N/A;  . HEMORRHOID BANDING    . KNEE SURGERY Right 05/2006   total right  . LEG SURGERY    . MELANOMA EXCISION Left 08/2006   left leg excision  . NM MYOCAR PERF WALL MOTION  03/27/2010   dipyridamole; small reversible basal to mid-septal defect (?artifact), post-stress EF 55%, low risk scan   . PARTIAL HYSTERECTOMY  1976  . TONSILLECTOMY    . TOTAL HIP ARTHROPLASTY Left 2001&2004    X 2 FOR LEFT HIP  . TRANSTHORACIC ECHOCARDIOGRAM  10/06/2012   EF 55-60%, mod eccentric hypertrophy, grade 2 diastolic dysfunction; mildly calcifed AV annulus with moderate regurg; aortic root mildly dilated; LA severely dailted; PA peak pressure 22mHg  . VARICOSE VEIN SURGERY      Denies any headaches, dizziness, double vision, fevers, chills, night sweats, nausea, vomiting, diarrhea, constipation, chest pain, heart palpitations, shortness of breath, blood in stool, black tarry stool, urinary pain, urinary burning, urinary frequency, hematuria. 14 point review of systems was performed and is negative except as detailed under history of present illness and above    PHYSICAL EXAMINATION  ECOG PERFORMANCE STATUS: 2 - Symptomatic, <50% confined to bed  Vitals:   05/15/16 1000  BP: (!) 132/47  Pulse: 78  Resp: 16  Temp: 98 F (36.7 C)    GENERAL:alert, no distress, well nourished, well developed, comfortable, cooperative, obese and smiling.  Uses walker SKIN: skin color, texture, turgor are normal, no rashes or significant lesions HEAD: Normocephalic, No masses, lesions, tenderness or abnormalities EYES: normal,  PERRLA, EOMI, Conjunctiva are pink and non-injected EARS: External ears normal.  Hearing aid in R ear OROPHARYNX:mucous membranes are moist  NECK: supple, no adenopathy, thyroid normal size, non-tender, without nodularity, no stridor, non-tender, trachea midline LYMPH:  no palpable lymphadenopathy BREAST:not examined LUNGS: clear to auscultation and percussion HEART: regular rate & rhythm, no murmurs, no gallops, S1 normal and S2 normal ABDOMEN:abdomen soft, non-tender, obese and normal bowel sounds BACK: Back symmetric, no curvature., No CVA tenderness EXTREMITIES:less then 2 second capillary refill, no joint deformities, effusion, or inflammation, no edema, no skin discoloration, no clubbing, no cyanosis  NEURO: alert & oriented x 3 with fluent speech, no focal motor/sensory deficits, gait normal with cane for stability Breast exam: surgical incision site, 12:00 position, vertical, extends to nipple.  Firmness secondary to radiation changes. L breast large incision site, along superior aspect.  No palpable masses, no axillary adenopathy   LABORATORY DATA: I have reviewed the data below as listed. CBC    Component Value Date/Time   WBC 6.4 05/03/2015 1002  RBC 4.23 05/03/2015 1002   HGB 12.4 05/03/2015 1002   HCT 34.3 08/29/2015 1057   PLT 162 05/03/2015 1002   MCV 93.4 05/03/2015 1002   MCH 29.3 05/03/2015 1002   MCHC 31.4 05/03/2015 1002   RDW 14.7 05/03/2015 1002   LYMPHSABS 2.1 05/03/2015 1002   MONOABS 0.6 05/03/2015 1002   EOSABS 0.2 05/03/2015 1002   BASOSABS 0.0 05/03/2015 1002      Chemistry      RADIOGRAPHIC STUDIES: CLINICAL DATA: Shortness of breath. Persistent cough.  EXAM: CHEST 2 VIEW  COMPARISON: 12/18/2014  FINDINGS: Cardiomediastinal silhouette is stably enlarged. Mediastinal contours appear intact. The aorta is torturous and contains atherosclerotic calcifications at the arch.  There is no evidence of focal airspace consolidation,  pleural effusion or pneumothorax. There are coarse interstitial markings.  There is an irregularity of the cortex of the right posterior sixth rib, which may represent a nondisplaced fracture. There is diffuse osteopenia. Soft tissues are grossly normal.  IMPRESSION: No evidence of acute cardiopulmonary disease.  Coarse interstitial markings, likely related to chronic interstitial lung disease.  Possible nondisplaced right sixth rib fracture, with uncertain acuity.   Electronically Signed  By: Fidela Salisbury M.D.  On: 04/21/2015 15:03     ASSESSMENT:  1. Stage I (T1 A.) melanoma the of left thigh,  superficial spreading-type,  thus far without recurrence with resection in December 2007 and re-resection 08/06/2006. She had negative margins at that time.  2. DCIS of the right breast in June 1998 status post surgical resection and she thinks she had radiation therapy as well.  3. Iron deficiency anemia GI blood losses with colonoscopy revealing friable anal canal hemorrhoids and diverticulosis in March 2010 by Dr. Sydell Axon  4. Right knee replacement 2007 by Dr. Aline Brochure  5. Left hip replacement x2 by Dr. Eulas Post years ago  Adenopathy in the left inguinal area is stable  Patient Active Problem List   Diagnosis Date Noted  . Rectal bleeding 05/16/2015  . Constipation 02/09/2015  . Lumbago 04/27/2014  . Hemorrhoids 08/23/2013  . Occult GI bleeding 08/23/2013  . Early satiety 06/07/2013  . Loss of weight 06/07/2013  . Globus sensation 06/07/2013  . IDA (iron deficiency anemia) 06/07/2013  . Melanoma of skin ([place]) 03/14/2013  . DCIS (ductal carcinoma in situ) of breast 03/14/2013  . Iron deficiency anemia, unspecified 03/14/2013  . BPPV (benign paroxysmal positional vertigo) 02/10/2013  . RBBB 02/10/2013  . Ascending aortic aneurysm ([place]) 02/10/2013  . Aortic insufficiency 02/10/2013  . Hip pain 11/02/2012  . S/P total knee replacement 08/14/2011  . Helicobacter  pylori gastritis 07/14/2011  . Anemia 02/04/2011  . Bladder infection   . BURSITIS, HIP 05/29/2009  . LOWER LEG, ARTHRITIS, DEGEN./OSTEO 11/23/2007  . HIP PAIN 06/07/2007  . PAIN IN JOINT, LOWER LEG 06/07/2007

## 2016-05-15 NOTE — Patient Instructions (Signed)
[facility] Discharge Instructions  RECOMMENDATIONS MADE BY THE CONSULTANT AND ANY TEST RESULTS WILL BE SENT TO YOUR REFERRING PHYSICIAN.  You were seen today by Dr. Oliva Bustard. Follow up in 6 months for office visit and labs.  Thank you for choosing [place] at [facility] to provide your oncology and hematology care.  To afford each patient quality time with our provider, please arrive at least 15 minutes before your scheduled appointment time.   Beginning January 23rd 2017 lab work for the [facility] will be done in the  Main lab at [facility] on 1st floor. If you have a lab appointment with the [facility] please come in thru the  Main Entrance and check in at the main information desk  You need to re-schedule your appointment should you arrive 10 or more minutes late.  We strive to give you quality time with our providers, and arriving late affects you and other patients whose appointments are after yours.  Also, if you no show three or more times for appointments you may be dismissed from the clinic at the providers discretion.     Again, thank you for choosing [facility].  Our hope is that these requests will decrease the amount of time that you wait before being seen by our physicians.       _____________________________________________________________  Should you have questions after your visit to [facility], please contact our office at [phone] between the hours of 8:30 a.m. and 4:30 p.m.  Voicemails left after 4:30 p.m. will not be returned until the following business day.  For prescription refill requests, have your pharmacy contact our office.         Resources For Cancer Patients and their Caregivers ? American Cancer Society: Can assist with transportation, wigs, general needs, runs Look Good Feel Better.        708-655-0411 ? Cancer Care: Provides financial assistance,  online support groups, medication/co-pay assistance.  1-800-813-HOPE 872 094 5037) ? [facility] Assists [place] cancer patients and their families through emotional , educational and financial support.  431-830-4815 ? [place] DSS Where to apply for food stamps, Medicaid and utility assistance. 351-330-0478 ? [facility]: Transportation to medical appointments. 754-242-6904 ? Social Security Administration: May apply for disability if have a Stage IV cancer. 414 380 0286 6293003309 ? [employer], Disability and Transit Services: Assists with nutrition, care and transit needs. [place] Support Programs: @10RELATIVEDAYS @ > Cancer Support Group  2nd Tuesday of the month 1pm-2pm, Journey Room  > Creative Journey  3rd Tuesday of the month 1130am-1pm, Journey Room  > Look Good Feel Better  1st Wednesday of the month 10am-12 noon, Journey Room (Call [place] to register [phone])

## 2016-05-18 ENCOUNTER — Other Ambulatory Visit: Payer: Self-pay

## 2016-05-18 ENCOUNTER — Emergency Department (HOSPITAL_COMMUNITY): Payer: Medicare Other

## 2016-05-18 ENCOUNTER — Inpatient Hospital Stay (HOSPITAL_COMMUNITY)
Admission: EM | Admit: 2016-05-18 | Discharge: 2016-05-21 | DRG: 189 | Disposition: A | Discharge status: Home / Self Care | Payer: Medicare Other | Attending: Internal Medicine | Admitting: Internal Medicine

## 2016-05-18 ENCOUNTER — Encounter (HOSPITAL_COMMUNITY): Payer: Self-pay | Admitting: *Deleted

## 2016-05-18 DIAGNOSIS — Z87891 Personal history of nicotine dependence: Secondary | ICD-10-CM | POA: Diagnosis not present

## 2016-05-18 DIAGNOSIS — N390 Urinary tract infection, site not specified: Secondary | ICD-10-CM | POA: Diagnosis present

## 2016-05-18 DIAGNOSIS — D6489 Other specified anemias: Secondary | ICD-10-CM | POA: Diagnosis present

## 2016-05-18 DIAGNOSIS — J40 Bronchitis, not specified as acute or chronic: Secondary | ICD-10-CM | POA: Diagnosis present

## 2016-05-18 DIAGNOSIS — M1990 Unspecified osteoarthritis, unspecified site: Secondary | ICD-10-CM | POA: Diagnosis present

## 2016-05-18 DIAGNOSIS — Z7902 Long term (current) use of antithrombotics/antiplatelets: Secondary | ICD-10-CM | POA: Diagnosis not present

## 2016-05-18 DIAGNOSIS — R600 Localized edema: Secondary | ICD-10-CM | POA: Diagnosis not present

## 2016-05-18 DIAGNOSIS — Z96643 Presence of artificial hip joint, bilateral: Secondary | ICD-10-CM | POA: Diagnosis present

## 2016-05-18 DIAGNOSIS — Z8673 Personal history of transient ischemic attack (TIA), and cerebral infarction without residual deficits: Secondary | ICD-10-CM

## 2016-05-18 DIAGNOSIS — Z79899 Other long term (current) drug therapy: Secondary | ICD-10-CM

## 2016-05-18 DIAGNOSIS — J209 Acute bronchitis, unspecified: Secondary | ICD-10-CM | POA: Diagnosis not present

## 2016-05-18 DIAGNOSIS — M150 Primary generalized (osteo)arthritis: Secondary | ICD-10-CM

## 2016-05-18 DIAGNOSIS — M159 Polyosteoarthritis, unspecified: Secondary | ICD-10-CM | POA: Diagnosis present

## 2016-05-18 DIAGNOSIS — J9601 Acute respiratory failure with hypoxia: Principal | ICD-10-CM | POA: Diagnosis present

## 2016-05-18 DIAGNOSIS — J440 Chronic obstructive pulmonary disease with acute lower respiratory infection: Secondary | ICD-10-CM | POA: Diagnosis present

## 2016-05-18 DIAGNOSIS — Z66 Do not resuscitate: Secondary | ICD-10-CM | POA: Diagnosis present

## 2016-05-18 DIAGNOSIS — Z886 Allergy status to analgesic agent status: Secondary | ICD-10-CM | POA: Diagnosis not present

## 2016-05-18 DIAGNOSIS — I712 Thoracic aortic aneurysm, without rupture: Secondary | ICD-10-CM | POA: Diagnosis present

## 2016-05-18 DIAGNOSIS — R059 Cough, unspecified: Secondary | ICD-10-CM | POA: Diagnosis present

## 2016-05-18 DIAGNOSIS — R05 Cough: Secondary | ICD-10-CM | POA: Diagnosis not present

## 2016-05-18 DIAGNOSIS — G8929 Other chronic pain: Secondary | ICD-10-CM | POA: Diagnosis present

## 2016-05-18 DIAGNOSIS — Z853 Personal history of malignant neoplasm of breast: Secondary | ICD-10-CM | POA: Diagnosis not present

## 2016-05-18 DIAGNOSIS — E876 Hypokalemia: Secondary | ICD-10-CM | POA: Diagnosis not present

## 2016-05-18 DIAGNOSIS — Z8711 Personal history of peptic ulcer disease: Secondary | ICD-10-CM | POA: Diagnosis not present

## 2016-05-18 DIAGNOSIS — R8281 Pyuria: Secondary | ICD-10-CM | POA: Diagnosis present

## 2016-05-18 DIAGNOSIS — J441 Chronic obstructive pulmonary disease with (acute) exacerbation: Secondary | ICD-10-CM | POA: Diagnosis present

## 2016-05-18 DIAGNOSIS — Z85828 Personal history of other malignant neoplasm of skin: Secondary | ICD-10-CM

## 2016-05-18 DIAGNOSIS — N12 Tubulo-interstitial nephritis, not specified as acute or chronic: Secondary | ICD-10-CM | POA: Diagnosis present

## 2016-05-18 DIAGNOSIS — I7121 Aneurysm of the ascending aorta, without rupture: Secondary | ICD-10-CM | POA: Diagnosis present

## 2016-05-18 DIAGNOSIS — Z885 Allergy status to narcotic agent status: Secondary | ICD-10-CM

## 2016-05-18 DIAGNOSIS — I10 Essential (primary) hypertension: Secondary | ICD-10-CM | POA: Diagnosis present

## 2016-05-18 DIAGNOSIS — D696 Thrombocytopenia, unspecified: Secondary | ICD-10-CM | POA: Diagnosis present

## 2016-05-18 DIAGNOSIS — R0902 Hypoxemia: Secondary | ICD-10-CM

## 2016-05-18 DIAGNOSIS — R1310 Dysphagia, unspecified: Secondary | ICD-10-CM

## 2016-05-18 DIAGNOSIS — M6281 Muscle weakness (generalized): Secondary | ICD-10-CM

## 2016-05-18 DIAGNOSIS — Z880 Allergy status to penicillin: Secondary | ICD-10-CM

## 2016-05-18 DIAGNOSIS — R531 Weakness: Secondary | ICD-10-CM | POA: Diagnosis not present

## 2016-05-18 LAB — URINALYSIS, ROUTINE W REFLEX MICROSCOPIC
Glucose, UA: NEGATIVE mg/dL
KETONES UR: NEGATIVE mg/dL
NITRITE: NEGATIVE
PH: 6 (ref 5.0–8.0)
SPECIFIC GRAVITY, URINE: 1.025 (ref 1.005–1.030)

## 2016-05-18 LAB — INFLUENZA PANEL BY PCR (TYPE A & B)
H1N1 flu by pcr: NOT DETECTED
INFLAPCR: NEGATIVE
INFLBPCR: NEGATIVE

## 2016-05-18 LAB — BASIC METABOLIC PANEL
ANION GAP: 4 — AB (ref 5–15)
BUN: 12 mg/dL (ref 6–20)
CHLORIDE: 104 mmol/L (ref 101–111)
CO2: 31 mmol/L (ref 22–32)
CREATININE: 0.83 mg/dL (ref 0.44–1.00)
Calcium: 8.3 mg/dL — ABNORMAL LOW (ref 8.9–10.3)
GFR calc non Af Amer: 60 mL/min — ABNORMAL LOW (ref >60)
Glucose, Bld: 105 mg/dL — ABNORMAL HIGH (ref 65–99)
Potassium: 3.1 mmol/L — ABNORMAL LOW (ref 3.5–5.1)
SODIUM: 139 mmol/L (ref 135–145)

## 2016-05-18 LAB — CBC WITH DIFFERENTIAL/PLATELET
BASOS ABS: 0 10*3/uL (ref 0.0–0.1)
BASOS PCT: 0 %
EOS ABS: 0 10*3/uL (ref 0.0–0.7)
Eosinophils Relative: 1 %
HEMATOCRIT: 36.9 % (ref 36.0–46.0)
HEMOGLOBIN: 11.5 g/dL — AB (ref 12.0–15.0)
Lymphocytes Relative: 19 %
Lymphs Abs: 1.2 10*3/uL (ref 0.7–4.0)
MCH: 28.9 pg (ref 26.0–34.0)
MCHC: 31.2 g/dL (ref 30.0–36.0)
MCV: 92.7 fL (ref 78.0–100.0)
Monocytes Absolute: 0.6 10*3/uL (ref 0.1–1.0)
Monocytes Relative: 9 %
NEUTROS ABS: 4.8 10*3/uL (ref 1.7–7.7)
NEUTROS PCT: 71 %
Platelets: 130 10*3/uL — ABNORMAL LOW (ref 150–400)
RBC: 3.98 MIL/uL (ref 3.87–5.11)
RDW: 14.7 % (ref 11.5–15.5)
WBC: 6.7 10*3/uL (ref 4.0–10.5)

## 2016-05-18 LAB — URINE MICROSCOPIC-ADD ON

## 2016-05-18 MED ORDER — DEXTROSE 5 % IV SOLN
1.0000 g | INTRAVENOUS | Status: DC
  Administered 2016-05-19 – 2016-05-20 (×2): 1 g via INTRAVENOUS
  Filled 2016-05-18 (×3): qty 10

## 2016-05-18 MED ORDER — ONDANSETRON HCL 4 MG/2ML IJ SOLN: 4.0000 mg | Freq: Four times a day (QID) | INTRAMUSCULAR | Status: DC | PRN

## 2016-05-18 MED ORDER — PREDNISONE 20 MG PO TABS
20.0000 mg | ORAL_TABLET | Freq: Once | ORAL | Status: AC
  Administered 2016-05-18: 20 mg via ORAL
  Filled 2016-05-18: qty 1

## 2016-05-18 MED ORDER — FUROSEMIDE 20 MG PO TABS
20.0000 mg | ORAL_TABLET | Freq: Every day | ORAL | Status: DC
  Administered 2016-05-19 – 2016-05-21 (×3): 20 mg via ORAL
  Filled 2016-05-18 (×3): qty 1

## 2016-05-18 MED ORDER — ACETAMINOPHEN 650 MG RE SUPP: 650.0000 mg | Freq: Four times a day (QID) | RECTAL | Status: DC | PRN

## 2016-05-18 MED ORDER — TRAMADOL-ACETAMINOPHEN 37.5-325 MG PO TABS
1.0000 | ORAL_TABLET | Freq: Four times a day (QID) | ORAL | Status: DC | PRN
  Administered 2016-05-18: 1 via ORAL
  Filled 2016-05-18: qty 1

## 2016-05-18 MED ORDER — ALBUTEROL SULFATE (2.5 MG/3ML) 0.083% IN NEBU
5.0000 mg | INHALATION_SOLUTION | Freq: Once | RESPIRATORY_TRACT | Status: AC
  Administered 2016-05-18: 5 mg via RESPIRATORY_TRACT
  Filled 2016-05-18: qty 6

## 2016-05-18 MED ORDER — ACETAMINOPHEN 325 MG PO TABS: 650.0000 mg | ORAL_TABLET | Freq: Four times a day (QID) | ORAL | Status: DC | PRN

## 2016-05-18 MED ORDER — IPRATROPIUM-ALBUTEROL 0.5-2.5 (3) MG/3ML IN SOLN
3.0000 mL | Freq: Four times a day (QID) | RESPIRATORY_TRACT | Status: DC
  Administered 2016-05-18: 3 mL via RESPIRATORY_TRACT
  Filled 2016-05-18: qty 3

## 2016-05-18 MED ORDER — DEXTROSE 5 % IV SOLN
1.0000 g | Freq: Once | INTRAVENOUS | Status: AC
  Administered 2016-05-18: 1 g via INTRAVENOUS
  Filled 2016-05-18: qty 10

## 2016-05-18 MED ORDER — ENOXAPARIN SODIUM 40 MG/0.4ML ~~LOC~~ SOLN
40.0000 mg | SUBCUTANEOUS | Status: DC
  Administered 2016-05-18 – 2016-05-20 (×3): 40 mg via SUBCUTANEOUS
  Filled 2016-05-18 (×3): qty 0.4

## 2016-05-18 MED ORDER — SENNOSIDES-DOCUSATE SODIUM 8.6-50 MG PO TABS: 1.0000 | ORAL_TABLET | Freq: Every evening | ORAL | Status: DC | PRN

## 2016-05-18 MED ORDER — PREDNISONE 20 MG PO TABS
20.0000 mg | ORAL_TABLET | Freq: Every day | ORAL | Status: DC
  Administered 2016-05-19: 20 mg via ORAL
  Filled 2016-05-18: qty 1

## 2016-05-18 MED ORDER — POTASSIUM CHLORIDE CRYS ER 20 MEQ PO TBCR
20.0000 meq | EXTENDED_RELEASE_TABLET | Freq: Once | ORAL | Status: AC
  Administered 2016-05-18: 20 meq via ORAL
  Filled 2016-05-18: qty 1

## 2016-05-18 MED ORDER — GABAPENTIN 100 MG PO CAPS
200.0000 mg | ORAL_CAPSULE | Freq: Three times a day (TID) | ORAL | Status: DC
  Administered 2016-05-18 – 2016-05-21 (×8): 200 mg via ORAL
  Filled 2016-05-18 (×8): qty 2

## 2016-05-18 MED ORDER — ONDANSETRON HCL 4 MG PO TABS: 4.0000 mg | ORAL_TABLET | Freq: Four times a day (QID) | ORAL | Status: DC | PRN

## 2016-05-18 MED ORDER — SODIUM CHLORIDE 0.9% FLUSH
3.0000 mL | Freq: Two times a day (BID) | INTRAVENOUS | Status: DC
  Administered 2016-05-18 – 2016-05-21 (×5): 3 mL via INTRAVENOUS

## 2016-05-18 MED ORDER — PREDNISOLONE ACETATE 1 % OP SUSP
1.0000 [drp] | Freq: Four times a day (QID) | OPHTHALMIC | Status: DC
  Administered 2016-05-19 – 2016-05-21 (×10): 1 [drp] via OPHTHALMIC
  Filled 2016-05-18: qty 1

## 2016-05-18 MED ORDER — PREDNISOLONE ACETATE 1 % OP SUSP
OPHTHALMIC | Status: AC
  Filled 2016-05-18: qty 5

## 2016-05-18 MED ORDER — PANTOPRAZOLE SODIUM 40 MG PO TBEC
40.0000 mg | DELAYED_RELEASE_TABLET | Freq: Two times a day (BID) | ORAL | Status: DC
  Administered 2016-05-18 – 2016-05-21 (×6): 40 mg via ORAL
  Filled 2016-05-18 (×6): qty 1

## 2016-05-18 MED ORDER — HYDROCORTISONE ACETATE 25 MG RE SUPP: 25.0000 mg | Freq: Two times a day (BID) | RECTAL | Status: DC | PRN

## 2016-05-18 MED ORDER — ALBUTEROL SULFATE (2.5 MG/3ML) 0.083% IN NEBU: 2.5000 mg | INHALATION_SOLUTION | RESPIRATORY_TRACT | Status: DC | PRN

## 2016-05-18 MED ORDER — SODIUM CHLORIDE (HYPERTONIC) 2 % OP SOLN
1.0000 [drp] | Freq: Three times a day (TID) | OPHTHALMIC | Status: DC
  Administered 2016-05-19 – 2016-05-21 (×7): 1 [drp] via OPHTHALMIC
  Filled 2016-05-18: qty 15

## 2016-05-18 MED ORDER — CLOPIDOGREL BISULFATE 75 MG PO TABS
75.0000 mg | ORAL_TABLET | Freq: Every day | ORAL | Status: DC
  Administered 2016-05-19 – 2016-05-21 (×3): 75 mg via ORAL
  Filled 2016-05-18 (×3): qty 1

## 2016-05-18 MED ORDER — SODIUM CHLORIDE 0.9% FLUSH: 3.0000 mL | INTRAVENOUS | Status: DC | PRN

## 2016-05-18 MED ORDER — SODIUM CHLORIDE 0.9 % IV SOLN
250.0000 mL | INTRAVENOUS | Status: DC | PRN
  Administered 2016-05-19 (×2): 250 mL via INTRAVENOUS

## 2016-05-18 NOTE — ED Notes (Signed)
Assisting pt to bathroom across the hall

## 2016-05-18 NOTE — ED Provider Notes (Signed)
[facility] DEPT Provider Note   CSN: [id number] Arrival date & time: 05/18/16  1413     History   Chief Complaint Chief Complaint  Patient presents with  . Cough    HPI [name] [name] is a 82 y.o. female.  HPI  She is a 82 year old female who presents today with her daughters who state that she had seemed weak and has not had much appetite today. They have noted some cough and congestion and increased fatigue. She has not taken as much as usual by mouth today but has not had nausea, vomiting, or diarrhea. She denies headache, neck pain, chest pain, dyspnea, abdominal pain, or vomiting. She does endorse some nausea and generalized weakness.  Past Medical History:  Diagnosis Date  . Aneurysm, thoracic aortic ([place])   . Aortic insufficiency   . Ascending aortic aneurysm ([place])    CT in 2012 - 4.2x4.2cm  . Bladder infection    h/o  . 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 Dehydration    HISTORY   . Fall at home 09/10/12  . Hemorrhoids   . Hip fracture ([place])    hip surgery 2001  . History of blood clots    in leg  . History of knee surgery   . Hypertension   . Iron deficiency anemia, unspecified 03/14/2013   secondary to GI blood loss  . Kidney infection    h/o  . Melanoma of skin ([place]) 03/14/2013  . Melanoma of thigh ([place])    left  . Mini stroke ([place])   . S/P colonoscopy March 2010   RMR: friable anal canal hemorrhoids, hyperplastic ascending polyp, adenomatous descending polyp   . Stomach ulcer    secondary to h.pylori, s/p treatment  . Thyroid condition   . Venous stasis    edema    Patient Active Problem List   Diagnosis Date Noted  . Rectal bleeding 05/16/2015  . Constipation 02/09/2015  . Lumbago 04/27/2014  . Hemorrhoids 08/23/2013  . Occult GI bleeding 08/23/2013  . Early satiety 06/07/2013  . Loss of weight 06/07/2013  . Globus sensation 06/07/2013  . IDA (iron deficiency anemia) 06/07/2013  . Melanoma of skin  ([place]) 03/14/2013  . DCIS (ductal carcinoma in situ) of breast 03/14/2013  . Iron deficiency anemia, unspecified 03/14/2013  . BPPV (benign paroxysmal positional vertigo) 02/10/2013  . RBBB 02/10/2013  . Ascending aortic aneurysm ([place]) 02/10/2013  . Aortic insufficiency 02/10/2013  . Hip pain 11/02/2012  . S/P total knee replacement 08/14/2011  . Helicobacter pylori gastritis 07/14/2011  . Anemia 02/04/2011  . Bladder infection   . BURSITIS, HIP 05/29/2009  . LOWER LEG, ARTHRITIS, DEGEN./OSTEO 11/23/2007  . HIP PAIN 06/07/2007  . PAIN IN JOINT, LOWER LEG 06/07/2007    Past Surgical History:  Procedure Laterality Date  . APPENDECTOMY  1942  . BREAST LUMPECTOMY  1998  . CATARACT EXTRACTION, BILATERAL    . CHOLECYSTECTOMY    . COLONOSCOPY  06/05/11   pancolonic diverticulosis/ileal reosion/abnormal anorectal junction s/p biopsy: path for small intestine and Ti was benign with non-villous atrophy, rectal biopsy with prominent prolapse changes, no acute inflammation  . CORNEAL TRANSPLANT Bilateral 2013  . ENTEROSCOPY N/A 06/13/2013   KDT:OIZT Gastritis/GI BLEED MOST LIKELY DUE TO DUODENAL ULCERS, AND ? AVMs  . ESOPHAGOGASTRODUODENOSCOPY  06/05/11   small hiatal hernia; + H.PYLORI GASTRITIS, s/p 5 days of Pylera, unable to finish due to N/V  . ESOPHAGOGASTRODUODENOSCOPY N/A 06/13/2013  Dr. Oneida Alar- see enteroscopy  . ESOPHAGOGASTRODUODENOSCOPY (EGD) WITH ESOPHAGEAL DILATION N/A 06/08/2013   HCW:CBJSE hiatal hernia;  otherwise normal EGD s/p dilation  . GIVENS CAPSULE STUDY N/A 06/08/2013   Procedure: GIVENS CAPSULE STUDY;  Surgeon: Daneil Dolin, MD;  Location: [facility] SUITE;  Service: Endoscopy;  Laterality: N/A;  . HEMORRHOID BANDING    . KNEE SURGERY Right 05/2006   total right  . LEG SURGERY    . MELANOMA EXCISION Left 08/2006   left leg excision  . NM MYOCAR PERF WALL MOTION  03/27/2010   dipyridamole; small reversible basal to mid-septal defect (?artifact), post-stress EF 55%,  low risk scan   . PARTIAL HYSTERECTOMY  1976  . TONSILLECTOMY    . TOTAL HIP ARTHROPLASTY Left 2001&2004    X 2 FOR LEFT HIP  . TRANSTHORACIC ECHOCARDIOGRAM  10/06/2012   EF 55-60%, mod eccentric hypertrophy, grade 2 diastolic dysfunction; mildly calcifed AV annulus with moderate regurg; aortic root mildly dilated; LA severely dailted; PA peak pressure 29mHg  . VARICOSE VEIN SURGERY      OB History    No data available       Home Medications    Prior to Admission medications   Medication Sig Start Date End Date Taking? Authorizing Provider  albuterol (PROVENTIL HFA;VENTOLIN HFA) 108 (90 BASE) MCG/ACT inhaler Inhale 1-2 puffs into the lungs every 6 (six) hours as needed for wheezing. 04/21/15   Tanna Furry, MD  clopidogrel (PLAVIX) 75 MG tablet Take 75 mg by mouth daily with breakfast.    Historical Provider, MD  furosemide (LASIX) 20 MG tablet Take 1 tablet (20 mg total) by mouth daily. 03/13/14   Nat Christen, MD  gabapentin (NEURONTIN) 100 MG capsule Take 2 capsules (200 mg total) by mouth 3 (three) times daily. 06/11/15   Carole Civil, MD  hydrocortisone (ANUSOL-HC) 25 MG suppository Place 1 suppository (25 mg total) rectally every 12 (twelve) hours. 05/16/15   Annitta Needs, NP  Multiple Vitamins-Minerals (OPTI-VITAMINS PO) Take 1 tablet by mouth daily.    Historical Provider, MD  ondansetron (ZOFRAN) 4 MG tablet Take 1 tablet (4 mg total) by mouth every 8 (eight) hours as needed for nausea or vomiting. Patient taking differently: Take 4 mg by mouth every morning. *MAY TAKE AS NEEDED DAILY BUT TAKES ONE TABLET IN THE MORNING 09/13/13   Mahala Menghini, PA-C  pantoprazole (PROTONIX) 40 MG tablet Take 1 tablet (40 mg total) by mouth 2 (two) times daily. 02/09/15   Annitta Needs, NP  prednisoLONE acetate (PRED FORTE) 1 % ophthalmic suspension 1 drop 4 (four) times daily.    Historical Provider, MD  psyllium (METAMUCIL) 58.6 % packet Take 1 packet by mouth 2 (two) times daily.    Historical  Provider, MD  sodium chloride (MURO 128) 2 % ophthalmic solution Place 1 drop into both eyes 3 (three) times daily.     Historical Provider, MD  traMADol (ULTRAM) 50 MG tablet Take 1 tablet (50 mg total) by mouth every 4 (four) hours as needed. 08/13/15   Carole Civil, MD  traMADol-acetaminophen Caroline Sauger) 37.5-325 MG tablet  05/10/16   Historical Provider, MD    Family History Family History  Problem Relation Age of Onset  . Breast cancer Daughter     also hyperlipidemia  . Breast cancer Daughter     also hyperlipidemia  . Asthma Mother   . Multiple sclerosis Child   . Hyperlipidemia Child   . Colon cancer Neg Hx  Social History Social History  Substance Use Topics  . Smoking status: Former Smoker    Packs/day: 1.50    Years: 20.00    Types: Cigarettes    Quit date: 07/05/1975  . Smokeless tobacco: Never Used     Comment: Quit smoking in 1975  . Alcohol use No     Allergies   Codeine; Iron; Asa [aspirin]; and Penicillins   Review of Systems Review of Systems  All other systems reviewed and are negative.    Physical Exam Updated Vital Signs BP 149/59 (BP Location: Left Arm)   Pulse 83   Temp 100.3 F (37.9 C) (Rectal)   Resp 16   Ht 5\' 5"  (1.651 m)   Wt 77.1 kg   SpO2 97%   BMI 28.29 kg/m   Physical Exam  Constitutional: She appears well-developed and well-nourished. No distress.  HENT:  Head: Normocephalic and atraumatic.  Right Ear: External ear normal.  Left Ear: External ear normal.  Nose: Nose normal.  Mouth/Throat: Oropharynx is clear and moist.  Eyes: Conjunctivae and EOM are normal. Pupils are equal, round, and reactive to light.  Neck: Normal range of motion. Neck supple.  Cardiovascular: Normal rate.  An irregularly irregular rhythm present.  Pulmonary/Chest: Effort normal.  Coarse breath sounds throughout, no wheezes Coughing on exam  Abdominal: Soft. Bowel sounds are normal.  Musculoskeletal: Normal range of motion. She exhibits  no edema.  Neurological: She is alert.  Skin: Skin is warm and dry. Capillary refill takes less than 2 seconds.  Psychiatric: She has a normal mood and affect.  Nursing note and vitals reviewed.    ED Treatments / Results  Labs (all labs ordered are listed, but only abnormal results are displayed) Labs Reviewed  CBC WITH DIFFERENTIAL/PLATELET - Abnormal; Notable for the following:       Result Value   Hemoglobin 11.5 (*)    Platelets 130 (*)    All other components within normal limits  BASIC METABOLIC PANEL - Abnormal; Notable for the following:    Potassium 3.1 (*)    Glucose, Bld 105 (*)    Calcium 8.3 (*)    GFR calc non Af Amer 60 (*)    Anion gap 4 (*)    All other components within normal limits  URINALYSIS, ROUTINE W REFLEX MICROSCOPIC (NOT AT [facility]) - Abnormal; Notable for the following:    Hgb urine dipstick LARGE (*)    Bilirubin Urine SMALL (*)    Protein, ur TRACE (*)    Leukocytes, UA MODERATE (*)    All other components within normal limits  URINE MICROSCOPIC-ADD ON - Abnormal; Notable for the following:    Squamous Epithelial / LPF 0-5 (*)    Bacteria, UA MANY (*)    All other components within normal limits  URINE CULTURE    EKG  EKG Interpretation  Date/Time:  Sunday May 18 2016 17:48:57 EDT Ventricular Rate:  80 PR Interval:    QRS Duration: 160 QT Interval:  457 QTC Calculation: 528 R Axis:   -12 Text Interpretation:  Atrial fibrillation Right bundle branch block Anteroseptal infarct, age indeterminate Baseline wander in lead(s) V6 No significant change since last tracing Confirmed by Nakaya Mishkin MD, Andee Poles [id number]) on 05/18/2016 5:53:28 PM       Radiology Dg Chest 2 View  Result Date: 05/18/2016 CLINICAL DATA:  82 year old female with a history of cough and congestion EXAM: CHEST  2 VIEW COMPARISON:  04/21/2015, 12/18/2014 FINDINGS: Cardiomediastinal silhouette unchanged in size and  contour. Calcifications of the aortic arch. Coarsened  interstitial markings with interlobular septal thickening, similar to comparison. No confluent airspace disease. No pleural effusion. No pneumothorax. Right posterior rib rib posttraumatic deformity. Osteopenia. Degenerative changes of the spine. No displaced fracture identified. IMPRESSION: Chronic lung changes without evidence of lobar pneumonia. Aortic atherosclerosis. Signed, Dulcy Fanny. Earleen Newport, DO [facility] Electronically Signed   By: Corrie Mckusick D.O.   On: 05/18/2016 14:59    Procedures Procedures (including critical care time)  Medications Ordered in ED Medications  albuterol (PROVENTIL) (2.5 MG/3ML) 0.083% nebulizer solution 5 mg (5 mg Nebulization Given 05/18/16 1624)  predniSONE (DELTASONE) tablet 20 mg (20 mg Oral Given 05/18/16 1544)  cefTRIAXone (ROCEPHIN) 1 g in dextrose 5 % 50 mL IVPB (0 g Intravenous Stopped 05/18/16 1736)  potassium chloride SA (K-DUR,KLOR-CON) CR tablet 20 mEq (20 mEq Oral Given 05/18/16 1734)     Initial Impression / Assessment and Plan / ED Course  I have reviewed the triage vital signs and the nursing notes.  Pertinent labs & imaging results that were available during my care of the patient were reviewed by me and considered in my medical decision making (see chart for details).  Clinical Course    82 y.o. Female presents with cough and feve, uti on labs.  Treated with rocephin and urine cultured.  Patient given neb but desats intermittently.  No focal infiltrate seen on cxr.  Patient continues coughing on exam and sats 90% but 98 on [place].  Plan admission for further evaluation and treatment- possible evolving pneumonia.  Will obtain inflenza screen.  EKG a fib- previous ekg states nsr,but very similar and suspect patient has been in a fib.   Final Clinical Impressions(s) / ED Diagnoses   Final diagnoses:  Muscle weakness (generalized)  Acute respiratory failure with hypoxia [facility])    New  Prescriptions New Prescriptions   No medications on file     Pattricia Boss, MD 05/25/16 1700

## 2016-05-18 NOTE — ED Notes (Signed)
Warm blanket given

## 2016-05-18 NOTE — ED Notes (Signed)
EDp made aware of pt's RA sats low and Respiratory applied 2 L/M [place] O2 on pt,  Informed EDP that daughters are concerned that pt is worse than before, EDP in room.  While in room pt O2 taken off and sats decreased to 89%, EDP informed and O2 applied back on 2 L/M [place], sats increased to 93 % with O2 on

## 2016-05-18 NOTE — H&P (Signed)
[facility] History and Physical  [name] [name] ASN:[id number] DOB: 27-Jul-1923 DOA: 05/18/2016  Referring physician: Dr Hart Robinsons, ED [facility] PCP: [name], MD   Chief Complaint: Cough/ congestion  HPI: [name] [name] is a 82 y.o. female with hx of HTN, melanoma of skin, PUD, venous stasis, anemia, UTI, thoracic aorta aneurysm who presents to ED today with report per family of cough and congestion started yesterday.    In ED O2 sats dropped into 80's during a coughing spell.  Pt lives alone in apts for the elderly.  Family is here w her.  We are asked to admit for fever/ upper resp infection w hypoxemia.    Patient / family give hx, mostly family (daughters x 2).  Yesterday developed a "cold" with runny nose, , eyes watering, then today started w a raspy cough, wheezing and subjective fevers this am w chills.  No hx PNA, no sick relatives, nonprod cough, no CP.  No abd pain, no n/v/d, no confusion.  In ED CXR showed chronic septal thickening o/w negative.    Pt grew up in [place], moved to [place] , had 5 children. Her husband died of massive stroke in his 29's.  Pt lives alone in a community for the elderly in an apt.  Walks with a walker, no difficulties.  Hx of TAA and AI, not a surg candidate so her heart doctor told her recently to just come back "as needed".  Hx mini CVA, ulcers.       ROS  denies CP  no joint pain   no HA  no blurry vision  no rash  no diarrhea  no nausea/ vomiting  no dysuria  no difficulty voiding  no change in urine color    Past Medical History  Past Medical History:  Diagnosis Date  . Aneurysm, thoracic aortic ([place])   . Aortic insufficiency   . Ascending aortic aneurysm ([place])    CT in 2012 - 4.2x4.2cm  . Bladder infection    h/o  . 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 Dehydration    HISTORY   . Fall at home 09/10/12  . Hemorrhoids   . Hip fracture ([place])    hip surgery 2001  . History of blood clots    in leg  . History of knee surgery   . Hypertension   . Iron deficiency anemia, unspecified 03/14/2013   secondary to GI blood loss  . Kidney infection    h/o  . Melanoma of skin ([place]) 03/14/2013  . Melanoma of thigh ([place])    left  . Mini stroke ([place])   . S/P colonoscopy March 2010   RMR: friable anal canal hemorrhoids, hyperplastic ascending polyp, adenomatous descending polyp   . Stomach ulcer    secondary to h.pylori, s/p treatment  . Thyroid condition   . Venous stasis    edema   Past Surgical History  Past Surgical History:  Procedure Laterality Date  . APPENDECTOMY  1942  . BREAST LUMPECTOMY  1998  . CATARACT EXTRACTION, BILATERAL    . CHOLECYSTECTOMY    . COLONOSCOPY  06/05/11   pancolonic diverticulosis/ileal reosion/abnormal anorectal junction s/p biopsy: path for small intestine and Ti was benign with non-villous atrophy, rectal biopsy with prominent prolapse changes, no acute inflammation  . CORNEAL TRANSPLANT Bilateral 2013  . ENTEROSCOPY N/A 06/13/2013   LPF:XTKW Gastritis/GI BLEED MOST LIKELY DUE TO DUODENAL ULCERS, AND ? AVMs  . ESOPHAGOGASTRODUODENOSCOPY  06/05/11   small hiatal hernia; + H.PYLORI GASTRITIS, s/p 5 days of Pylera, unable to finish due to N/V  . ESOPHAGOGASTRODUODENOSCOPY N/A 06/13/2013   Dr. Oneida Alar- see enteroscopy  . ESOPHAGOGASTRODUODENOSCOPY (EGD) WITH ESOPHAGEAL DILATION N/A 06/08/2013   CVE:LFYBO hiatal hernia;  otherwise normal EGD s/p dilation  . GIVENS CAPSULE STUDY N/A 06/08/2013   Procedure: GIVENS CAPSULE STUDY;  Surgeon: Daneil Dolin, MD;  Location: [facility] ENDO SUITE;  Service: Endoscopy;  Laterality: N/A;  . HEMORRHOID BANDING    . KNEE SURGERY Right 05/2006   total right  . LEG SURGERY    . MELANOMA EXCISION Left 08/2006   left leg excision  . NM MYOCAR PERF WALL MOTION  03/27/2010   dipyridamole; small reversible basal to mid-septal defect (?artifact), post-stress EF 55%, low risk scan   . PARTIAL HYSTERECTOMY  1976  . TONSILLECTOMY     . TOTAL HIP ARTHROPLASTY Left 2001&2004    X 2 FOR LEFT HIP  . TRANSTHORACIC ECHOCARDIOGRAM  10/06/2012   EF 55-60%, mod eccentric hypertrophy, grade 2 diastolic dysfunction; mildly calcifed AV annulus with moderate regurg; aortic root mildly dilated; LA severely dailted; PA peak pressure 21mHg  . VARICOSE VEIN SURGERY     Family History  Family History  Problem Relation Age of Onset  . Breast cancer Daughter     also hyperlipidemia  . Breast cancer Daughter     also hyperlipidemia  . Asthma Mother   . Multiple sclerosis Child   . Hyperlipidemia Child   . Colon cancer Neg Hx    Social History  reports that she quit smoking about 40 years ago. Her smoking use included Cigarettes. She has a 30.00 pack-year smoking history. She has never used smokeless tobacco. She reports that she does not drink alcohol or use drugs. Allergies  Allergies  Allergen Reactions  . Codeine Diarrhea and Nausea Only  . Iron Nausea And Vomiting    Oral iron causes nausea and vomiting  . Asa [Aspirin] Rash and Other (See Comments)    Petechiae   . Penicillins Rash   Home medications Prior to Admission medications   Medication Sig Start Date End Date Taking? Authorizing Provider  albuterol (PROVENTIL HFA;VENTOLIN HFA) 108 (90 BASE) MCG/ACT inhaler Inhale 1-2 puffs into the lungs every 6 (six) hours as needed for wheezing. 04/21/15  Yes Tanna Furry, MD  clopidogrel (PLAVIX) 75 MG tablet Take 75 mg by mouth daily with breakfast.   Yes Historical Provider, MD  furosemide (LASIX) 20 MG tablet Take 1 tablet (20 mg total) by mouth daily. 03/13/14  Yes Nat Christen, MD  gabapentin (NEURONTIN) 100 MG capsule Take 2 capsules (200 mg total) by mouth 3 (three) times daily. 06/11/15  Yes Carole Civil, MD  hydrocortisone (ANUSOL-HC) 25 MG suppository Place 1 suppository (25 mg total) rectally every 12 (twelve) hours. Patient taking differently: Place 25 mg rectally 2 (two) times daily as needed for hemorrhoids or  itching.  05/16/15  Yes Annitta Needs, NP  ondansetron (ZOFRAN) 4 MG tablet Take 1 tablet (4 mg total) by mouth every 8 (eight) hours as needed for nausea or vomiting. Patient taking differently: Take 4 mg by mouth every morning. *MAY TAKE AS NEEDED DAILY BUT TAKES ONE TABLET IN THE MORNING 09/13/13  Yes Mahala Menghini, PA-C  pantoprazole (PROTONIX) 40 MG tablet Take 1 tablet (40 mg total) by mouth 2 (two) times daily. 02/09/15  Yes Annitta Needs, NP  prednisoLONE acetate (PRED FORTE) 1 % ophthalmic  suspension Place 1 drop into both eyes 4 (four) times daily.    Yes Historical Provider, MD  psyllium (METAMUCIL) 58.6 % packet Take 1 packet by mouth daily as needed (for constipation).    Yes Historical Provider, MD  sodium chloride (MURO 128) 2 % ophthalmic solution Place 1 drop into both eyes 3 (three) times daily.    Yes Historical Provider, MD  traMADol-acetaminophen (ULTRACET) 37.5-325 MG tablet Take 1 tablet by mouth every 6 (six) hours as needed for moderate pain or severe pain.  05/10/16  Yes Historical Provider, MD   Liver Function Tests No results for input(s): AST, ALT, ALKPHOS, BILITOT, PROT, ALBUMIN in the last 168 hours. No results for input(s): LIPASE, AMYLASE in the last 168 hours. CBC  Recent Labs Lab 05/18/16 1555  WBC 6.7  NEUTROABS 4.8  HGB 11.5*  HCT 36.9  MCV 92.7  PLT 885*   Basic Metabolic Panel  Recent Labs Lab 05/13/16 0913 05/18/16 1555  NA  --  139  K  --  3.1*  CL  --  104  CO2  --  31  GLUCOSE  --  105*  BUN  --  12  CREATININE 0.90 0.83  CALCIUM  --  8.3*     Vitals:   05/18/16 1736 05/18/16 1737 05/18/16 1751 05/18/16 1754  BP:  148/59    Pulse: 83  81 76  Resp:   17 16  Temp:      TempSrc:      SpO2: 97%  93% 94%  Weight:      Height:       Exam: Gen alert, raspy cough off and on, no distress No rash, cyanosis or gangrene Sclera anicteric, throat clear  No jvd or bruits Chest fine insp rales bilat bases, +kyphosis, upper lung fields  clear, no wheezing RRR w 2/6 SEM and early diast M 2/6 Abd soft ntnd no mass or ascites +bs no hsm GU defer MS no joint effusions or deformity Ext chronic LE 1+ edema / no wounds or ulcers Neuro is alert, Ox 3 , nf  Home meds > albuterol MDI, plavix, Lasix, Neurontin, Anulsol-HC, Zofran, Protonix, Metamucil, Ultracet, eye drops  Na 139 K 3.1  BUN 12 Cr 0.83   Ca 8.3  CO2 31  Glu 105   WBC 6k  Hb 11.5  plt 130 UA > many bact, mod LE/ neg nitrite, TNTC wbc's, 6-30 rbc's, 0-5 epis    EKG (independ reviewed) > afib, RBBB, old ant-septal MI, no acute changes CXR (independ reviewed) > Chronic lung changes without evidence of lobar pneumonia. Aortic atherosclerosis.   Assessment: 1.   Cough/ URI/ fever/ hypoxemia, transient - neg CXR, suspect acute bronchitis viral vs bacterial.  Plan admit, nebs, low dose po prednisone and IV abx w Rocephin.  O2.  2.   Pyuria - r/o UTI, send UCx, will be on IV abx if has UTI 3.   Chronic LE edema - cont lasix 4.   Chron arthritis/ DJD - sig chronic pain, cont prn pain meds, neurontin 5.   Hx mini CVA - cont plavix 6.   DNR - disc w patient and family, pt requests no heroic measures   Plan - as above     [place] D [facility] Pager [phone]   If 7PM-7AM, please contact night-coverage [email or web address] Password [email or web address] 05/18/2016, 6:08 PM

## 2016-05-18 NOTE — ED Triage Notes (Signed)
Pt comes in with cough and congestion starting yesterday. Denies any fevers. States she has fatigue. Denies any n/v/d.

## 2016-05-19 DIAGNOSIS — D696 Thrombocytopenia, unspecified: Secondary | ICD-10-CM | POA: Diagnosis present

## 2016-05-19 DIAGNOSIS — E876 Hypokalemia: Secondary | ICD-10-CM | POA: Diagnosis present

## 2016-05-19 DIAGNOSIS — D649 Anemia, unspecified: Secondary | ICD-10-CM

## 2016-05-19 LAB — BASIC METABOLIC PANEL
Anion gap: 7 (ref 5–15)
BUN: 13 mg/dL (ref 6–20)
CHLORIDE: 104 mmol/L (ref 101–111)
CO2: 31 mmol/L (ref 22–32)
CREATININE: 0.86 mg/dL (ref 0.44–1.00)
Calcium: 8.2 mg/dL — ABNORMAL LOW (ref 8.9–10.3)
GFR calc Af Amer: >60 mL/min (ref >60)
GFR, EST NON AFRICAN AMERICAN: 57 mL/min — AB (ref >60)
Glucose, Bld: 110 mg/dL — ABNORMAL HIGH (ref 65–99)
Potassium: 3.2 mmol/L — ABNORMAL LOW (ref 3.5–5.1)
SODIUM: 142 mmol/L (ref 135–145)

## 2016-05-19 LAB — CBC
HCT: 33.1 % — ABNORMAL LOW (ref 36.0–46.0)
Hemoglobin: 10 g/dL — ABNORMAL LOW (ref 12.0–15.0)
MCH: 28 pg (ref 26.0–34.0)
MCHC: 30.2 g/dL (ref 30.0–36.0)
MCV: 92.7 fL (ref 78.0–100.0)
PLATELETS: 105 10*3/uL — AB (ref 150–400)
RBC: 3.57 MIL/uL — ABNORMAL LOW (ref 3.87–5.11)
RDW: 15 % (ref 11.5–15.5)
WBC: 4.6 10*3/uL (ref 4.0–10.5)

## 2016-05-19 LAB — MAGNESIUM: Magnesium: 2.2 mg/dL (ref 1.7–2.4)

## 2016-05-19 MED ORDER — GUAIFENESIN-DM 100-10 MG/5ML PO SYRP
5.0000 mL | ORAL_SOLUTION | Freq: Two times a day (BID) | ORAL | Status: DC
  Administered 2016-05-19 – 2016-05-21 (×5): 5 mL via ORAL
  Filled 2016-05-19 (×5): qty 5

## 2016-05-19 MED ORDER — PREDNISONE 20 MG PO TABS
30.0000 mg | ORAL_TABLET | Freq: Two times a day (BID) | ORAL | Status: DC
  Administered 2016-05-19 – 2016-05-20 (×2): 30 mg via ORAL
  Filled 2016-05-19 (×2): qty 1

## 2016-05-19 MED ORDER — POTASSIUM CHLORIDE CRYS ER 20 MEQ PO TBCR
30.0000 meq | EXTENDED_RELEASE_TABLET | Freq: Two times a day (BID) | ORAL | Status: AC
  Administered 2016-05-19 (×2): 30 meq via ORAL
  Filled 2016-05-19: qty 1

## 2016-05-19 MED ORDER — IPRATROPIUM-ALBUTEROL 0.5-2.5 (3) MG/3ML IN SOLN
3.0000 mL | Freq: Three times a day (TID) | RESPIRATORY_TRACT | Status: DC
  Administered 2016-05-19 – 2016-05-21 (×7): 3 mL via RESPIRATORY_TRACT
  Filled 2016-05-19 (×6): qty 3

## 2016-05-19 MED ORDER — MOMETASONE FURO-FORMOTEROL FUM 100-5 MCG/ACT IN AERO
2.0000 | INHALATION_SPRAY | Freq: Two times a day (BID) | RESPIRATORY_TRACT | Status: DC
  Administered 2016-05-19 – 2016-05-21 (×4): 2 via RESPIRATORY_TRACT
  Filled 2016-05-19: qty 8.8

## 2016-05-19 NOTE — Care Management Note (Signed)
Case Management Note  Patient Details  Name: [name] [name] MRN: [id number] Date of Birth: 01/30/1923  Subjective/Objective:   Patient adm from home with acute bronchitis, new oxygen requirement. She lives at a senior apartment complex. She has a cane and walker at home. She has a PCP, transportation, and no issues affording medications. She would like [facility] RN services at discharge. Offered choice of [place] agencies.                  Action/Plan: Romualdo Bolk of [facility] notified and will obtain orders from chart. Patient made aware [facility] has 48 hours to initiate services.    Expected Discharge Date:     05/20/2016 Expected Discharge Plan:  [place]  In-House Referral:  NA  Discharge planning Services  CM Consult  Post Acute Care Choice:    Choice offered to:  Patient  DME Arranged:    DME Agency:     HH Arranged:  RN [place] Agency:     Status of Service:  In process, will continue to follow  If discussed at Long Length of Stay Meetings, dates discussed:    Additional Comments:  Eldena Dede, Chauncey [place], RN 05/19/2016, 2:55 PM

## 2016-05-19 NOTE — Evaluation (Signed)
Clinical/Bedside Swallow Evaluation Patient Details  Name: JALEIGHA DEANE MRN: [id number] Date of Birth: 02/24/1923  Today's Date: 05/19/2016 Time: SLP Start Time (ACUTE ONLY): 69 SLP Stop Time (ACUTE ONLY): 1550 SLP Time Calculation (min) (ACUTE ONLY): 40 min  Past Medical History:  Past Medical History:  Diagnosis Date  . Aneurysm, thoracic aortic ([place])   . Aortic insufficiency   . Ascending aortic aneurysm ([place])    CT in 2012 - 4.2x4.2cm  . Bladder infection    h/o  . 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 Dehydration    HISTORY   . Fall at home 09/10/12  . Hemorrhoids   . Hip fracture ([place])    hip surgery 2001  . History of blood clots    in leg  . History of knee surgery   . Hypertension   . Iron deficiency anemia, unspecified 03/14/2013   secondary to GI blood loss  . Kidney infection    h/o  . Melanoma of skin ([place]) 03/14/2013  . Melanoma of thigh ([place])    left  . Mini stroke ([place])   . S/P colonoscopy March 2010   RMR: friable anal canal hemorrhoids, hyperplastic ascending polyp, adenomatous descending polyp   . Stomach ulcer    secondary to h.pylori, s/p treatment  . Thyroid condition   . Venous stasis    edema   Past Surgical History:  Past Surgical History:  Procedure Laterality Date  . APPENDECTOMY  1942  . BREAST LUMPECTOMY  1998  . CATARACT EXTRACTION, BILATERAL    . CHOLECYSTECTOMY    . COLONOSCOPY  06/05/11   pancolonic diverticulosis/ileal reosion/abnormal anorectal junction s/p biopsy: path for small intestine and Ti was benign with non-villous atrophy, rectal biopsy with prominent prolapse changes, no acute inflammation  . CORNEAL TRANSPLANT Bilateral 2013  . ENTEROSCOPY N/A 06/13/2013   STM:HDQQ Gastritis/GI BLEED MOST LIKELY DUE TO DUODENAL ULCERS, AND ? AVMs  . ESOPHAGOGASTRODUODENOSCOPY  06/05/11   small hiatal hernia; + H.PYLORI GASTRITIS, s/p 5 days of Pylera, unable to finish due to N/V  .  ESOPHAGOGASTRODUODENOSCOPY N/A 06/13/2013   Dr. Oneida Alar- see enteroscopy  . ESOPHAGOGASTRODUODENOSCOPY (EGD) WITH ESOPHAGEAL DILATION N/A 06/08/2013   IWL:NLGXQ hiatal hernia;  otherwise normal EGD s/p dilation  . GIVENS CAPSULE STUDY N/A 06/08/2013   Procedure: GIVENS CAPSULE STUDY;  Surgeon: Daneil Dolin, MD;  Location: [facility];  Service: Endoscopy;  Laterality: N/A;  . HEMORRHOID BANDING    . KNEE SURGERY Right 05/2006   total right  . LEG SURGERY    . MELANOMA EXCISION Left 08/2006   left leg excision  . NM MYOCAR PERF WALL MOTION  03/27/2010   dipyridamole; small reversible basal to mid-septal defect (?artifact), post-stress EF 55%, low risk scan   . PARTIAL HYSTERECTOMY  1976  . TONSILLECTOMY    . TOTAL HIP ARTHROPLASTY Left 2001&2004    X 2 FOR LEFT HIP  . TRANSTHORACIC ECHOCARDIOGRAM  10/06/2012   EF 55-60%, mod eccentric hypertrophy, grade 2 diastolic dysfunction; mildly calcifed AV annulus with moderate regurg; aortic root mildly dilated; LA severely dailted; PA peak pressure 39mHg  . VARICOSE VEIN SURGERY     HPI:  Patient is a 82 year old woman with a history of HTN, PUD, chronic venous stasis, anemia, and melanoma of the skin, who presented to the ED on 05/18/2016 with chest congestion and a cough. In the ED, she was febrile with a temperature 100.3 and hemodynamically stable. Her  oxygen saturation was 83% on room air. Her lab data were significant for hemoglobin of 11.5, platelet count of 130, and potassium of 3.1. Her urinalysis revealed too numerous to count WBCs and many bacteria. Chest x-ray revealed chronic lung changes without pneumonia. She was admitted for further evaluation and management.   Recent UGI (05/09/2016):  Drainage of contrast from the esophagus appears to be primarily gravity dependent.  Mild narrowing at the gastroesophageal junction though a 12.5 mm diameter tablet passed into the stomach without obstruction.  No definite areas of esophageal wall  irregularity or intraluminal filling defect.  Chest x-ray (05/18/16) Chronic lung changes without evidence of lobar pneumonia.  Assessment / Plan / Recommendation Clinical Impression  Pt seen at bedside with family present for clinical swallow evaluation. Pt admitted with cough and elevated WBC. She is a pt of Dr. Roseanne Kaufman and recently had UGI barium pill esophagram which showed esophageal dysmotility. She is scheduled for outpatient SLP evaluation this Thursday. Oral motor examination reveals WNL strength, ROM, coordination or oral structures. Pt with U/L dentures. She was assessed with ice chips, thin water via spoon, cup, and straw, puree, and graham crackers. Pt with a delayed strong, clear cough after sequential straw sips and prolonged oral phase with puree. Pt visibly short of breath throughout po consumption. Given known esophageal phase dysphagia (essentially gravity dependent to clear contrast), pt's subjective complaints of "food not going down", and Pt now with coughing during and after meals, recommend MBSS to evaluate oropharyngeal swallow and complete esophageal sweep. Suspect primary esophageal phase dysphagia, however could be an oropharyngeal component. Will keep diet the same and complete MBSS tomorrow. Pt/family acknowledge recommendations and questions answered.     Aspiration Risk  Mild aspiration risk    Diet Recommendation Regular;Thin liquid (with f/u MBSS tomorrow)   Liquid Administration via: Cup;Straw Medication Administration: Whole meds with liquid Supervision: Patient able to self feed;Intermittent supervision to cue for compensatory strategies Compensations: Multiple dry swallows after each bite/sip Postural Changes: Seated upright at 90 degrees;Remain upright for at least 30 minutes after po intake    Other  Recommendations Oral Care Recommendations: Oral care BID;Patient independent with oral care Other Recommendations: Clarify dietary restrictions   Follow up  Recommendations Outpatient SLP      Frequency and Duration min 2x/week  1 week       Prognosis Prognosis for Safe Diet Advancement: Good      Swallow Study   General Date of Onset: 05/18/16 HPI: Patient is a 82 year old woman with a history of HTN, PUD, chronic venous stasis, anemia, and melanoma of the skin, who presented to the ED on 05/18/2016 with chest congestion and a cough. In the ED, she was febrile with a temperature 100.3 and hemodynamically stable. Her oxygen saturation was 83% on room air. Her lab data were significant for hemoglobin of 11.5, platelet count of 130, and potassium of 3.1. Her urinalysis revealed too numerous to count WBCs and many bacteria. Chest x-ray revealed chronic lung changes without pneumonia. She was admitted for further evaluation and management. Type of Study: Bedside Swallow Evaluation Previous Swallow Assessment: None on record Diet Prior to this Study: Regular;Thin liquids Temperature Spikes Noted: No Respiratory Status: Nasal cannula History of Recent Intubation: No Behavior/Cognition: Alert;Cooperative;Pleasant mood Oral Cavity Assessment: Within Functional Limits Oral Care Completed by SLP: Recent completion by staff Oral Cavity - Dentition: Dentures, top;Dentures, bottom Vision: Functional for self-feeding Self-Feeding Abilities: Able to feed self;Needs set up Patient Positioning: Upright in bed Baseline Vocal  Quality: Normal Volitional Cough: Weak Volitional Swallow: Able to elicit    Oral/Motor/Sensory Function Overall Oral Motor/Sensory Function: Within functional limits   Ice Chips Ice chips: Within functional limits Presentation: Spoon   Thin Liquid Thin Liquid: Impaired Presentation: Cup;Self Fed;Straw;Spoon Pharyngeal  Phase Impairments: Cough - Delayed    Nectar Thick Nectar Thick Liquid: Not tested   Honey Thick Honey Thick Liquid: Not tested   Puree Puree: Impaired Presentation: Spoon Oral Phase Functional Implications:  Prolonged oral transit   Solid   Thank you,  Genene Churn, CCC-SLP [phone]    Solid: Within functional limits Presentation: Self Fed        Caleesi Kohl 05/19/2016,4:15 PM

## 2016-05-19 NOTE — Progress Notes (Signed)
PROGRESS NOTE    [name] [name]  WHQ:[id number] DOB: 1922/08/29 DOA: 05/18/2016 PCP: [name], MD    Brief Narrative:  Patient is a 82 year old woman with a history of HTN, PUD, chronic venous stasis, anemia, and melanoma of the skin, who presented to the ED on 05/18/2016 with chest congestion and a cough. In the ED, she was febrile with a temperature 100.3 and hemodynamically stable. Her oxygen saturation was 83% on room air. Her lab data were significant for hemoglobin of 11.5, platelet count of 130, and potassium of 3.1. Her urinalysis revealed too numerous to count WBCs and many bacteria. Chest x-ray revealed chronic lung changes without pneumonia. She was admitted for further evaluation and management.   Assessment & Plan:   Principal Problem:   Acute bronchitis Active Problems:   Acute respiratory failure with hypoxia (HCC)   Ascending aortic aneurysm (HCC)   Bilateral lower extremity edema   Degenerative joint disease involving multiple joints   Pyuria   DNR (do not resuscitate)   Hypokalemia   Thrombocytopenia (HCC)   Normocytic anemia    1. Acute bronchitis causing acute respiratory failure with hypoxia. Patient was tested for influenza and was negative. She was started on oxygen, bronchodilators, prednisone, and Rocephin. -We'll continue current management with the changes including increasing prednisone to 30 mg twice a day, adding Dulera inhaler, and adding Robitussin-DM. -Continue to monitor her oxygen saturations for need for home oxygen.  Pyuria?UTI Patient hears analysis is consistent with infection, but she does not endorse dysuria. Urine culture was ordered. We'll continue Rocephin as started for acute bronchitis.  Normocytic anemia and thrombocytopenia. Patient has a history of anemia per chart review. She had one isolated low platelet count a couple of years ago. On admission, her hemoglobin was 11.5 and her platelet count was 130. -She has a  history of peptic ulcer disease and is on Plavix chronically for history of CVA. She was continued on her PPI. -With IV fluid hydration, her hemoglobin and platelet count has fallen some. -We'll order an anemia panel and TSH. -We'll continue Lovenox cautiously. We'll continue to follow her blood counts.  Hypokalemia. Patient's serum potassium is 3.2 and was 3.1 on admission. -Potassium chloride supplementation ordered this morning and magnesium level was checked. It was within normal limits. We'll continue to monitor in the setting of Lasix therapy.  Chronic lower extremity edema secondary to venous stasis. Patient was continued on Lasix.     DVT prophylaxis: Lovenox Code Status: DO NOT RESUSCITATE Family Communication: Discussed with granddaughter Disposition Plan: Discharge when clinically appropriate   Consultants:   None  Procedures:   None  Antimicrobials:   Rocephin 05/18/16>>   Subjective: Patient believes she is breathing a little better compared to yesterday.  Objective: Vitals:   05/18/16 2322 05/18/16 2324 05/19/16 0754 05/19/16 0913  BP:  (!) 155/58 (!) 124/42   Pulse:  73 74   Resp:  18 20   Temp:  98 F (36.7 C) 98.2 F (36.8 C)   TempSrc:  Oral Oral   SpO2: 98% 98% 95% 94%  Weight:  76.9 kg (169 lb 9.6 oz)    Height:        Intake/Output Summary (Last 24 hours) at 05/19/16 1024 Last data filed at 05/19/16 1022  Gross per 24 hour  Intake              480 ml  Output  0 ml  Net              480 ml   Filed Weights   05/18/16 1430 05/18/16 2324  Weight: 77.1 kg (170 lb) 76.9 kg (169 lb 9.6 oz)    Examination:  General exam: Appears calm and comfortable  Respiratory system: Diffuse scattered  wheezes. Respiratory effort normal. Cardiovascular system: S1 & S2 2/6 systolic murmur with occasional ectopy. Trace bilateral pedal edema. Gastrointestinal system: Abdomen is nondistended, soft and nontender. No organomegaly or masses  felt. Normal bowel sounds heard. Central nervous system: Alert and oriented. No focal neurological deficits. Extremities: Symmetric 5 x 5 power. Skin: No rashes, lesions or ulcers Psychiatry: Judgement and insight appear normal. Mood & affect appropriate.     Data Reviewed: I have personally reviewed following labs and imaging studies  CBC:  Recent Labs Lab 05/18/16 1555 05/19/16 0702  WBC 6.7 4.6  NEUTROABS 4.8  --   HGB 11.5* 10.0*  HCT 36.9 33.1*  MCV 92.7 92.7  PLT 130* 546*   Basic Metabolic Panel:  Recent Labs Lab 05/13/16 0913 05/18/16 1555 05/19/16 0702 05/19/16 0830  NA  --  139 142  --   K  --  3.1* 3.2*  --   CL  --  104 104  --   CO2  --  31 31  --   GLUCOSE  --  105* 110*  --   BUN  --  12 13  --   CREATININE 0.90 0.83 0.86  --   CALCIUM  --  8.3* 8.2*  --   MG  --   --   --  2.2   GFR: Estimated Creatinine Clearance: 43.7 mL/min (by C-G formula based on SCr of 0.86 mg/dL). Liver Function Tests: No results for input(s): AST, ALT, ALKPHOS, BILITOT, PROT, ALBUMIN in the last 168 hours. No results for input(s): LIPASE, AMYLASE in the last 168 hours. No results for input(s): AMMONIA in the last 168 hours. Coagulation Profile: No results for input(s): INR, PROTIME in the last 168 hours. Cardiac Enzymes: No results for input(s): CKTOTAL, CKMB, CKMBINDEX, TROPONINI in the last 168 hours. BNP (last 3 results) No results for input(s): PROBNP in the last 8760 hours. HbA1C: No results for input(s): HGBA1C in the last 72 hours. CBG: No results for input(s): GLUCAP in the last 168 hours. Lipid Profile: No results for input(s): CHOL, HDL, LDLCALC, TRIG, CHOLHDL, LDLDIRECT in the last 72 hours. Thyroid Function Tests: No results for input(s): TSH, T4TOTAL, FREET4, T3FREE, THYROIDAB in the last 72 hours. Anemia Panel: No results for input(s): VITAMINB12, FOLATE, FERRITIN, TIBC, IRON, RETICCTPCT in the last 72 hours. Sepsis Labs: No results for input(s):  PROCALCITON, LATICACIDVEN in the last 168 hours.  No results found for this or any previous visit (from the past 240 hour(s)).       Radiology Studies: Dg Chest 2 View  Result Date: 05/18/2016 CLINICAL DATA:  82 year old female with a history of cough and congestion EXAM: CHEST  2 VIEW COMPARISON:  04/21/2015, 12/18/2014 FINDINGS: Cardiomediastinal silhouette unchanged in size and contour. Calcifications of the aortic arch. Coarsened interstitial markings with interlobular septal thickening, similar to comparison. No confluent airspace disease. No pleural effusion. No pneumothorax. Right posterior rib rib posttraumatic deformity. Osteopenia. Degenerative changes of the spine. No displaced fracture identified. IMPRESSION: Chronic lung changes without evidence of lobar pneumonia. Aortic atherosclerosis. Signed, Dulcy Fanny. Earleen Newport, DO [facility] Electronically Signed   By: York Cerise  Earleen Newport D.O.   On: 05/18/2016 14:59        Scheduled Meds: . cefTRIAXone (ROCEPHIN)  IV  1 g Intravenous Q24H  . clopidogrel  75 mg Oral Q breakfast  . enoxaparin (LOVENOX) injection  40 mg Subcutaneous Q24H  . furosemide  20 mg Oral Daily  . gabapentin  200 mg Oral TID  . ipratropium-albuterol  3 mL Nebulization TID  . pantoprazole  40 mg Oral BID  . potassium chloride  30 mEq Oral BID  . prednisoLONE acetate  1 drop Both Eyes QID  . predniSONE  20 mg Oral Q breakfast  . sodium chloride  1 drop Both Eyes TID  . sodium chloride flush  3 mL Intravenous Q12H   Continuous Infusions:    LOS: 1 day    Time spent: 18 minutes    Rexene Alberts, MD [facility] Pager [phone]  If 7PM-7AM, please contact night-coverage [email or web address] Password [email or web address] 05/19/2016, 10:24 AM

## 2016-05-20 ENCOUNTER — Inpatient Hospital Stay (HOSPITAL_COMMUNITY): Payer: Medicare Other

## 2016-05-20 DIAGNOSIS — R1310 Dysphagia, unspecified: Secondary | ICD-10-CM

## 2016-05-20 LAB — IRON AND TIBC
Iron: 24 ug/dL — ABNORMAL LOW (ref 28–170)
SATURATION RATIOS: 6 % — AB (ref 10.4–31.8)
TIBC: 388 ug/dL (ref 250–450)
UIBC: 364 ug/dL

## 2016-05-20 LAB — URINE CULTURE: Culture: NO GROWTH

## 2016-05-20 LAB — BASIC METABOLIC PANEL
ANION GAP: 7 (ref 5–15)
BUN: 14 mg/dL (ref 6–20)
CALCIUM: 8.4 mg/dL — AB (ref 8.9–10.3)
CO2: 30 mmol/L (ref 22–32)
Chloride: 106 mmol/L (ref 101–111)
Creatinine, Ser: 0.8 mg/dL (ref 0.44–1.00)
GFR calc Af Amer: >60 mL/min (ref >60)
GFR calc non Af Amer: >60 mL/min (ref >60)
GLUCOSE: 120 mg/dL — AB (ref 65–99)
POTASSIUM: 3.5 mmol/L (ref 3.5–5.1)
Sodium: 143 mmol/L (ref 135–145)

## 2016-05-20 LAB — CBC
HEMATOCRIT: 34.8 % — AB (ref 36.0–46.0)
HEMOGLOBIN: 10.8 g/dL — AB (ref 12.0–15.0)
MCH: 28.8 pg (ref 26.0–34.0)
MCHC: 31 g/dL (ref 30.0–36.0)
MCV: 92.8 fL (ref 78.0–100.0)
Platelets: 120 10*3/uL — ABNORMAL LOW (ref 150–400)
RBC: 3.75 MIL/uL — ABNORMAL LOW (ref 3.87–5.11)
RDW: 15.2 % (ref 11.5–15.5)
WBC: 4.9 10*3/uL (ref 4.0–10.5)

## 2016-05-20 LAB — VITAMIN B12: VITAMIN B 12: 257 pg/mL (ref 180–914)

## 2016-05-20 LAB — FERRITIN: Ferritin: 19 ng/mL (ref 11–307)

## 2016-05-20 LAB — FOLATE: FOLATE: 19.2 ng/mL (ref >5.9)

## 2016-05-20 LAB — TSH: TSH: 0.464 u[IU]/mL (ref 0.350–4.500)

## 2016-05-20 MED ORDER — POTASSIUM CHLORIDE CRYS ER 20 MEQ PO TBCR
20.0000 meq | EXTENDED_RELEASE_TABLET | Freq: Every day | ORAL | Status: DC
  Administered 2016-05-20 – 2016-05-21 (×2): 20 meq via ORAL
  Filled 2016-05-20 (×2): qty 1

## 2016-05-20 MED ORDER — PREDNISONE 10 MG PO TABS
15.0000 mg | ORAL_TABLET | Freq: Two times a day (BID) | ORAL | Status: DC
  Administered 2016-05-20 – 2016-05-21 (×2): 15 mg via ORAL
  Filled 2016-05-20 (×2): qty 2

## 2016-05-20 NOTE — Progress Notes (Signed)
PROGRESS NOTE    [name] [name]  GHW:[id number] DOB: 1923-03-03 DOA: 05/18/2016 PCP: [name], MD    Brief Narrative:  Patient is a 82 year old woman with a history of HTN, PUD, chronic venous stasis, anemia, and melanoma of the skin, who presented to the ED on 05/18/2016 with chest congestion and a cough. In the ED, she was febrile with a temperature 100.3 and hemodynamically stable. Her oxygen saturation was 83% on room air. Her lab data were significant for hemoglobin of 11.5, platelet count of 130, and potassium of 3.1. Her urinalysis revealed too numerous to count WBCs and many bacteria. Chest x-ray revealed chronic lung changes without pneumonia. She was admitted for COPD exacerbation causing hypoxic respiratory failure. Patient is being evaluated by PT, disposition to home versus SNF is pending. It is unclear if she needs home oxygen. Patient will need another 24-48 hours of treatment in the hospital.   Assessment & Plan:   Principal Problem:   Acute bronchitis Active Problems:   Acute respiratory failure with hypoxia (HCC)   Ascending aortic aneurysm (HCC)   Bilateral lower extremity edema   Degenerative joint disease involving multiple joints   Pyuria   DNR (do not resuscitate)   Hypokalemia   Thrombocytopenia (HCC)   Normocytic anemia   Dysphagia    1. Acute bronchitis causing acute respiratory failure with hypoxia. Patient was tested for influenza and was negative. She was started on oxygen, bronchodilators, prednisone, and Rocephin. Dulera inhaler and Robitussin-DM were added. Her oxygen saturations have improved to the 95-96 range on supplemental oxygen. She appears to be improving clinically. -Continue to monitor her oxygen saturations for need for home oxygen. Nursing staff have been asked to check the patient's oxygen saturation on room air each shift. -We'll continue treatment in the hospital for another 24-48 hours.  Pyuria?UTI Patient hears analysis  is consistent with infection, but she does not endorse dysuria. Urine culture was ordered and revealed multiple species.  Normocytic anemia and thrombocytopenia. Patient has a history of anemia per chart review. She had one isolated low platelet count a couple of years ago. On admission, her hemoglobin was 11.5 and her platelet count was 130. -She has a history of peptic ulcer disease and is on Plavix chronically for history of CVA. She was continued on her PPI. -With IV fluid hydration, her hemoglobin and platelet count had fallen marginally. -Her TSH was within normal limits. Anemia panel was ordered and is pending. -We'll continue Lovenox cautiously. We'll continue to follow her blood counts.  Hypokalemia. Patient's serum potassium is 3.2 and was 3.1 on admission. -Patient was started on oral potassium chloride supplementation. Her magnesium level was within normal limits. Her serum potassium has improved, but she still needs supplementation. but We'll continue to monitor in the setting of Lasix therapy.  Dysphagia. ST was consulted for evaluation of dysphagia. MBSS is pending.  Chronic lower extremity edema secondary to venous stasis. Patient was continued on Lasix.  Possible deconditioning. PT has been consulted.   DVT prophylaxis: Lovenox Code Status: DO NOT RESUSCITATE Family Communication: Discussed with 2 daughters Disposition Plan: Discharge when clinically appropriate   Consultants:   None  Procedures:   None  Antimicrobials:   Rocephin 05/18/16>>   Subjective: Patient says that she is breathing better but overall, feels worn out and weak. She denies chest pain.  Objective: Vitals:   05/19/16 2103 05/20/16 0602 05/20/16 0808 05/20/16 0817  BP: 135/86 (!) 130/41    Pulse: 77 (!) 50  Resp: 18 17    Temp: 98.2 F (36.8 C) 98.4 F (36.9 C)    TempSrc: Oral Oral    SpO2: 96% 94% 96% 96%  Weight:  74.7 kg (164 lb 11.2 oz)    Height:         Intake/Output Summary (Last 24 hours) at 05/20/16 1212 Last data filed at 05/20/16 0900  Gross per 24 hour  Intake          1403.67 ml  Output                0 ml  Net          1403.67 ml   Filed Weights   05/18/16 1430 05/18/16 2324 05/20/16 0602  Weight: 77.1 kg (170 lb) 76.9 kg (169 lb 9.6 oz) 74.7 kg (164 lb 11.2 oz)    Examination:  General exam: Appears calm and comfortable  Respiratory system: Fewer expiratory wheezes compared to yesterday. Respiratory effort normal. Cardiovascular system: S1 & S2 2/6 systolic murmur with bradycardia and occasional ectopy. Trace bilateral pedal edema. Gastrointestinal system: Abdomen is nondistended, soft and nontender. No organomegaly or masses felt. Normal bowel sounds heard. Central nervous system: Alert and oriented. No focal neurological deficits. Extremities: Symmetric 5 x 5 power. Skin: No rashes, lesions or ulcers Psychiatry: Judgement and insight appear normal. Mood & affect appropriate.     Data Reviewed: I have personally reviewed following labs and imaging studies  CBC:  Recent Labs Lab 05/18/16 1555 05/19/16 0702 05/20/16 0645  WBC 6.7 4.6 4.9  NEUTROABS 4.8  --   --   HGB 11.5* 10.0* 10.8*  HCT 36.9 33.1* 34.8*  MCV 92.7 92.7 92.8  PLT 130* 105* 427*   Basic Metabolic Panel:  Recent Labs Lab 05/18/16 1555 05/19/16 0702 05/19/16 0830 05/20/16 0645  NA 139 142  --  143  K 3.1* 3.2*  --  3.5  CL 104 104  --  106  CO2 31 31  --  30  GLUCOSE 105* 110*  --  120*  BUN 12 13  --  14  CREATININE 0.83 0.86  --  0.80  CALCIUM 8.3* 8.2*  --  8.4*  MG  --   --  2.2  --    GFR: Estimated Creatinine Clearance: 46.4 mL/min (by C-G formula based on SCr of 0.8 mg/dL). Liver Function Tests: No results for input(s): AST, ALT, ALKPHOS, BILITOT, PROT, ALBUMIN in the last 168 hours. No results for input(s): LIPASE, AMYLASE in the last 168 hours. No results for input(s): AMMONIA in the last 168 hours. Coagulation  Profile: No results for input(s): INR, PROTIME in the last 168 hours. Cardiac Enzymes: No results for input(s): CKTOTAL, CKMB, CKMBINDEX, TROPONINI in the last 168 hours. BNP (last 3 results) No results for input(s): PROBNP in the last 8760 hours. HbA1C: No results for input(s): HGBA1C in the last 72 hours. CBG: No results for input(s): GLUCAP in the last 168 hours. Lipid Profile: No results for input(s): CHOL, HDL, LDLCALC, TRIG, CHOLHDL, LDLDIRECT in the last 72 hours. Thyroid Function Tests:  Recent Labs  05/20/16 0645  TSH 0.464   Anemia Panel: No results for input(s): VITAMINB12, FOLATE, FERRITIN, TIBC, IRON, RETICCTPCT in the last 72 hours. Sepsis Labs: No results for input(s): PROCALCITON, LATICACIDVEN in the last 168 hours.  Recent Results (from the past 240 hour(s))  Urine culture     Status: Abnormal   Collection Time: 05/18/16  4:04 PM  Result Value Ref Range Status  Specimen Description URINE, CATHETERIZED  Final   Special Requests NONE  Final   Culture MULTIPLE SPECIES PRESENT, SUGGEST RECOLLECTION (A)  Final   Report Status 05/20/2016 FINAL  Final  Urine culture     Status: None   Collection Time: 05/18/16 10:00 PM  Result Value Ref Range Status   Specimen Description URINE, CLEAN CATCH  Final   Special Requests NONE  Final   Culture NO GROWTH Performed at [facility]   Final   Report Status 05/20/2016 FINAL  Final         Radiology Studies: Dg Chest 2 View  Result Date: 05/18/2016 CLINICAL DATA:  82 year old female with a history of cough and congestion EXAM: CHEST  2 VIEW COMPARISON:  04/21/2015, 12/18/2014 FINDINGS: Cardiomediastinal silhouette unchanged in size and contour. Calcifications of the aortic arch. Coarsened interstitial markings with interlobular septal thickening, similar to comparison. No confluent airspace disease. No pleural effusion. No pneumothorax. Right posterior rib rib posttraumatic deformity. Osteopenia. Degenerative  changes of the spine. No displaced fracture identified. IMPRESSION: Chronic lung changes without evidence of lobar pneumonia. Aortic atherosclerosis. Signed, Dulcy Fanny. Earleen Newport, DO [facility] Electronically Signed   By: Corrie Mckusick D.O.   On: 05/18/2016 14:59        Scheduled Meds: . cefTRIAXone (ROCEPHIN)  IV  1 g Intravenous Q24H  . clopidogrel  75 mg Oral Q breakfast  . enoxaparin (LOVENOX) injection  40 mg Subcutaneous Q24H  . furosemide  20 mg Oral Daily  . gabapentin  200 mg Oral TID  . guaiFENesin-dextromethorphan  5 mL Oral BID  . ipratropium-albuterol  3 mL Nebulization TID  . mometasone-formoterol  2 puff Inhalation BID  . pantoprazole  40 mg Oral BID  . prednisoLONE acetate  1 drop Both Eyes QID  . predniSONE  30 mg Oral BID WC  . sodium chloride  1 drop Both Eyes TID  . sodium chloride flush  3 mL Intravenous Q12H   Continuous Infusions:    LOS: 2 days    Time spent: 42 minutes    Rexene Alberts, MD [facility] Pager [phone]  If 7PM-7AM, please contact night-coverage [email or web address] Password [email or web address] 05/20/2016, 12:12 PM

## 2016-05-20 NOTE — Procedures (Signed)
Objective Swallowing Evaluation: Type of Study: MBS-Modified Barium Swallow Study  Patient Details  Name: [name] [name] MRN: [id number] Date of Birth: 10-Feb-1923  Today's Date: 05/20/2016 Time: SLP Start Time (ACUTE ONLY): 1145-SLP Stop Time (ACUTE ONLY): 1215 SLP Time Calculation (min) (ACUTE ONLY): 30 min  Past Medical History:  Past Medical History:  Diagnosis Date  . Aneurysm, thoracic aortic ([place])   . Aortic insufficiency   . Ascending aortic aneurysm ([place])    CT in 2012 - 4.2x4.2cm  . Bladder infection    h/o  . 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 Dehydration    HISTORY   . Fall at home 09/10/12  . Hemorrhoids   . Hip fracture ([place])    hip surgery 2001  . History of blood clots    in leg  . History of knee surgery   . Hypertension   . Iron deficiency anemia, unspecified 03/14/2013   secondary to GI blood loss  . Kidney infection    h/o  . Melanoma of skin ([place]) 03/14/2013  . Melanoma of thigh ([place])    left  . Mini stroke ([place])   . S/P colonoscopy March 2010   RMR: friable anal canal hemorrhoids, hyperplastic ascending polyp, adenomatous descending polyp   . Stomach ulcer    secondary to h.pylori, s/p treatment  . Thyroid condition   . Venous stasis    edema   Past Surgical History:  Past Surgical History:  Procedure Laterality Date  . APPENDECTOMY  1942  . BREAST LUMPECTOMY  1998  . CATARACT EXTRACTION, BILATERAL    . CHOLECYSTECTOMY    . COLONOSCOPY  06/05/11   pancolonic diverticulosis/ileal reosion/abnormal anorectal junction s/p biopsy: path for small intestine and Ti was benign with non-villous atrophy, rectal biopsy with prominent prolapse changes, no acute inflammation  . CORNEAL TRANSPLANT Bilateral 2013  . ENTEROSCOPY N/A 06/13/2013   WUJ:WJXB Gastritis/GI BLEED MOST LIKELY DUE TO DUODENAL ULCERS, AND ? AVMs  . ESOPHAGOGASTRODUODENOSCOPY  06/05/11   small hiatal hernia; + H.PYLORI GASTRITIS, s/p 5 days of Pylera,  unable to finish due to N/V  . ESOPHAGOGASTRODUODENOSCOPY N/A 06/13/2013   Dr. Oneida Alar- see enteroscopy  . ESOPHAGOGASTRODUODENOSCOPY (EGD) WITH ESOPHAGEAL DILATION N/A 06/08/2013   JYN:WGNFA hiatal hernia;  otherwise normal EGD s/p dilation  . GIVENS CAPSULE STUDY N/A 06/08/2013   Procedure: GIVENS CAPSULE STUDY;  Surgeon: Daneil Dolin, MD;  Location: [facility];  Service: Endoscopy;  Laterality: N/A;  . HEMORRHOID BANDING    . KNEE SURGERY Right 05/2006   total right  . LEG SURGERY    . MELANOMA EXCISION Left 08/2006   left leg excision  . NM MYOCAR PERF WALL MOTION  03/27/2010   dipyridamole; small reversible basal to mid-septal defect (?artifact), post-stress EF 55%, low risk scan   . PARTIAL HYSTERECTOMY  1976  . TONSILLECTOMY    . TOTAL HIP ARTHROPLASTY Left 2001&2004    X 2 FOR LEFT HIP  . TRANSTHORACIC ECHOCARDIOGRAM  10/06/2012   EF 55-60%, mod eccentric hypertrophy, grade 2 diastolic dysfunction; mildly calcifed AV annulus with moderate regurg; aortic root mildly dilated; LA severely dailted; PA peak pressure 35mHg  . VARICOSE VEIN SURGERY     HPI: Patient is a 82 year old woman with a history of HTN, PUD, chronic venous stasis, anemia, and melanoma of the skin, who presented to the ED on 05/18/2016 with chest congestion and a cough. In the ED, she was febrile with a  temperature 100.3 and hemodynamically stable. Her oxygen saturation was 83% on room air. Her lab data were significant for hemoglobin of 11.5, platelet count of 130, and potassium of 3.1. Her urinalysis revealed too numerous to count WBCs and many bacteria. Chest x-ray revealed chronic lung changes without pneumonia. She was admitted for further evaluation and management.  Subjective: "I still have a cough."   Assessment / Plan / Recommendation  CHL IP CLINICAL IMPRESSIONS 05/20/2016  Therapy Diagnosis Suspected primary esophageal dysphagia  Clinical Impression Oropharyngeal swallow essentially WNL with the  exception of slight premature spillage with straw sips and when swallowing thin barium with barium tablet resulting in underepiglottic coating without deep penetration into laryngeal vestibule and mild retention of thin liquid residuals noted in pyriforms to which pt did not appear sensate and required verbal cue to "swallow again". The trace pooling only occurred with sequential straw sips and not with cup sips. Cricopharyngeus appeared prominent and esophageal sweep revealed retention of food/barium in mid and distal esophagus with air filled esophagus in proximal portion. Pt did experience some retrograde movement of retained barium/solids which is consistent with recent barium swallow (esophageal emptying was essentially gravity dependent) a couple weeks ago. Barium tablet appeared to traverse through esophagus in timely fashion before stasis noted in what appeared to be a hiatal hernia (could not be confirmed without presence of radiologist).  Pt subjectively reported globus sensation and shortness of breath and pointed to pharynx, however suspect this was referred symptoms from esophagus (air filled and retention of solids). Recommend self regulated regular textures and thin liquids and avoid use of straws. Pt encouraged to swallow 2x with each sip of liquid and implement reflux precautions (sit upright for all eating/drinking and remain upright 30-60 minutes following, consume frequent, smaller meals, and avoid perceived troublesome foods (breads and meats unless finely masticated and taken with liquids). Pt and daughter understand recommendations. Pt may benefit from one follow up SLP session as outpatient or if pt goes to SNF she can be seen by SLP there to ensure diet tolerance and ongoing pt/caregiver education.   Impact on safety and function Mild aspiration risk      CHL IP TREATMENT RECOMMENDATION 05/20/2016  Treatment Recommendations No treatment recommended at this time     Prognosis  05/20/2016  Prognosis for Safe Diet Advancement Good  Barriers to Reach Goals --  Barriers/Prognosis Comment --    CHL IP DIET RECOMMENDATION 05/20/2016  SLP Diet Recommendations Regular solids;Thin liquid  Liquid Administration via Cup;No straw  Medication Administration Whole meds with liquid  Compensations Multiple dry swallows after each bite/sip  Postural Changes Remain semi-upright after after feeds/meals (Comment);Seated upright at 90 degrees      CHL IP OTHER RECOMMENDATIONS 05/20/2016  Recommended Consults --  Oral Care Recommendations Oral care BID  Other Recommendations Clarify dietary restrictions      CHL IP FOLLOW UP RECOMMENDATIONS 05/20/2016  Follow up Recommendations Outpatient SLP      CHL IP FREQUENCY AND DURATION 05/19/2016  Speech Therapy Frequency (ACUTE ONLY) min 2x/week  Treatment Duration 1 week           CHL IP ORAL PHASE 05/20/2016  Oral Phase WFL  Oral - Pudding Teaspoon --  Oral - Pudding Cup --  Oral - Honey Teaspoon --  Oral - Honey Cup --  Oral - Nectar Teaspoon --  Oral - Nectar Cup --  Oral - Nectar Straw --  Oral - Thin Teaspoon --  Oral - Thin Cup --  Oral - Thin Straw --  Oral - Puree --  Oral - Mech Soft --  Oral - Regular --  Oral - Multi-Consistency --  Oral - Pill --  Oral Phase - Comment --    CHL IP PHARYNGEAL PHASE 05/20/2016  Pharyngeal Phase Impaired  Pharyngeal- Pudding Teaspoon --  Pharyngeal --  Pharyngeal- Pudding Cup --  Pharyngeal --  Pharyngeal- Honey Teaspoon --  Pharyngeal --  Pharyngeal- Honey Cup --  Pharyngeal --  Pharyngeal- Nectar Teaspoon --  Pharyngeal --  Pharyngeal- Nectar Cup --  Pharyngeal --  Pharyngeal- Nectar Straw --  Pharyngeal --  Pharyngeal- Thin Teaspoon --  Pharyngeal --  Pharyngeal- Thin Cup Delayed swallow initiation-vallecula  Pharyngeal --  Pharyngeal- Thin Straw Delayed swallow initiation-vallecula;Pharyngeal residue - pyriform  Pharyngeal --  Pharyngeal- Puree WFL   Pharyngeal --  Pharyngeal- Mechanical Soft --  Pharyngeal --  Pharyngeal- Regular WFL  Pharyngeal --  Pharyngeal- Multi-consistency --  Pharyngeal --  Pharyngeal- Pill WFL  Pharyngeal --  Pharyngeal Comment --     CHL IP CERVICAL ESOPHAGEAL PHASE 05/20/2016  Cervical Esophageal Phase Impaired  Pudding Teaspoon --  Pudding Cup --  Honey Teaspoon --  Honey Cup --  Nectar Teaspoon --  Nectar Cup --  Nectar Straw --  Thin Teaspoon --  Thin Cup --  Thin Straw --  Puree Prominent cricopharyngeal segment  Mechanical Soft --  Regular --  Multi-consistency --  Pill --  Cervical Esophageal Comment --   Thank you,  Genene Churn, [place]  No flowsheet data found.  Jia Dottavio 05/20/2016, 1:25 PM

## 2016-05-21 DIAGNOSIS — D696 Thrombocytopenia, unspecified: Secondary | ICD-10-CM

## 2016-05-21 DIAGNOSIS — Z66 Do not resuscitate: Secondary | ICD-10-CM

## 2016-05-21 DIAGNOSIS — E876 Hypokalemia: Secondary | ICD-10-CM

## 2016-05-21 DIAGNOSIS — I712 Thoracic aortic aneurysm, without rupture: Secondary | ICD-10-CM

## 2016-05-21 DIAGNOSIS — J9601 Acute respiratory failure with hypoxia: Principal | ICD-10-CM

## 2016-05-21 DIAGNOSIS — R1310 Dysphagia, unspecified: Secondary | ICD-10-CM

## 2016-05-21 DIAGNOSIS — J209 Acute bronchitis, unspecified: Secondary | ICD-10-CM

## 2016-05-21 DIAGNOSIS — R600 Localized edema: Secondary | ICD-10-CM

## 2016-05-21 LAB — BASIC METABOLIC PANEL
ANION GAP: 8 (ref 5–15)
BUN: 17 mg/dL (ref 6–20)
CALCIUM: 8.4 mg/dL — AB (ref 8.9–10.3)
CO2: 31 mmol/L (ref 22–32)
Chloride: 103 mmol/L (ref 101–111)
Creatinine, Ser: 0.82 mg/dL (ref 0.44–1.00)
Glucose, Bld: 103 mg/dL — ABNORMAL HIGH (ref 65–99)
Potassium: 2.9 mmol/L — ABNORMAL LOW (ref 3.5–5.1)
Sodium: 142 mmol/L (ref 135–145)

## 2016-05-21 MED ORDER — MOMETASONE FURO-FORMOTEROL FUM 100-5 MCG/ACT IN AERO: 2.0000 | INHALATION_SPRAY | Freq: Two times a day (BID) | RESPIRATORY_TRACT | 0 refills | Status: DC

## 2016-05-21 MED ORDER — PREDNISONE 10 MG PO TABS: ORAL_TABLET | ORAL | 0 refills | Status: DC

## 2016-05-21 MED ORDER — POTASSIUM CHLORIDE CRYS ER 20 MEQ PO TBCR: 20.0000 meq | EXTENDED_RELEASE_TABLET | Freq: Every day | ORAL | 0 refills | Status: DC

## 2016-05-21 MED ORDER — IPRATROPIUM-ALBUTEROL 0.5-2.5 (3) MG/3ML IN SOLN: 3.0000 mL | Freq: Three times a day (TID) | RESPIRATORY_TRACT | 0 refills | Status: DC

## 2016-05-21 MED ORDER — POTASSIUM CHLORIDE CRYS ER 20 MEQ PO TBCR
40.0000 meq | EXTENDED_RELEASE_TABLET | ORAL | Status: AC
  Administered 2016-05-21 (×2): 40 meq via ORAL
  Filled 2016-05-21 (×2): qty 2

## 2016-05-21 NOTE — Discharge Summary (Signed)
Physician Discharge Summary  [name] [name] [id number] DOB: August 30, 1922 DOA: 05/18/2016  PCP: [name], MD  Admit date: 05/18/2016 Discharge date: 05/21/2016  Admitted From: Home  Disposition:  Home  Recommendations for Outpatient Follow-up:  1. Follow up with PCP in 1-2 weeks 2. Please obtain BMP/CBC in one week 3. Home health for speech therapy   Home Health: No  Equipment/Devices: neb machine  Discharge Condition: Stable  CODE STATUS: DNR  Diet recommendation: heart healthy diet  Brief/Interim Summary: 82 year-old-female with a PMHx of HTN, PUD, chronic venous stasis, anemia, and melanoma presents to the ED with chest congestion and a cough. While in the ED, she was noted to be febrile at 100.3 and mildly hypokalemic. Urinalysis was indicative of infection with too numerous to count WBCs. Her chest xray was consistent with pneumonia. She was started on oxygen, bronchodilators, steroids, and antibiotics. Admitted for acute bronchitis secondary to acute respiratory failure with hypoxia.   Discharge Diagnoses:  Principal Problem:   Acute bronchitis Active Problems:   Ascending aortic aneurysm (HCC)   Acute respiratory failure with hypoxia (HCC)   Bilateral lower extremity edema   Degenerative joint disease involving multiple joints   Pyuria   DNR (do not resuscitate)   Hypokalemia   Thrombocytopenia (HCC)   Normocytic anemia   Dysphagia  1. Acute bronchitis with acute respiratory failure with hypoxia. Patient possibly has some element of underlying copd exacerbation as well. Her influenza PCR was negative. She has clinically improved significantly with steroids, abx and neb treatments.  She has been transitioned to prednisone taper. Will continue nebs and steroid inhalers at home. She was able to ambulate on room air without any respiratory distress and maintained oxygen saturations above 93% 2. Dysphagia. Patient reported dysphagia and was seen by speech  therapy. She underwent MBSS with recommendations of regular consistency food and thin liquids. Recommendations were to avoid straws, since she did better with sips from cup. It was also felt that she had more reflux/presbyesopahagus. She is continued on PPI 3. Hypokalemia. Likely related to lasix. Replaced. She will be discharged with supplemental potassium.  4. Chronic lower extremity edema secondary to venous statis. Patient is showing improvement. Will continue lasix.  5. Deconditioning. Physical therapy has been and felt patient was at baseline. She did not need any skilled therapy.   Discharge Instructions  Discharge Instructions    DME Nebulizer machine    Complete by:  As directed    Diet - low sodium heart healthy    Complete by:  As directed    Increase activity slowly    Complete by:  As directed        Medication List    TAKE these medications   albuterol 108 (90 Base) MCG/ACT inhaler Commonly known as:  PROVENTIL HFA;VENTOLIN HFA Inhale 1-2 puffs into the lungs every 6 (six) hours as needed for wheezing.   clopidogrel 75 MG tablet Commonly known as:  PLAVIX Take 75 mg by mouth daily with breakfast.   furosemide 20 MG tablet Commonly known as:  LASIX Take 1 tablet (20 mg total) by mouth daily.   gabapentin 100 MG capsule Commonly known as:  NEURONTIN Take 2 capsules (200 mg total) by mouth 3 (three) times daily.   hydrocortisone 25 MG suppository Commonly known as:  ANUSOL-HC Place 1 suppository (25 mg total) rectally every 12 (twelve) hours. What changed:  when to take this  reasons to take this   ipratropium-albuterol 0.5-2.5 (3) MG/3ML Soln Commonly  known as:  DUONEB Take 3 mLs by nebulization 3 (three) times daily.   mometasone-formoterol 100-5 MCG/ACT Aero Commonly known as:  DULERA Inhale 2 puffs into the lungs 2 (two) times daily.   ondansetron 4 MG tablet Commonly known as:  ZOFRAN Take 1 tablet (4 mg total) by mouth every 8 (eight) hours as  needed for nausea or vomiting. What changed:  when to take this  additional instructions   pantoprazole 40 MG tablet Commonly known as:  PROTONIX Take 1 tablet (40 mg total) by mouth 2 (two) times daily.   potassium chloride SA 20 MEQ tablet Commonly known as:  K-DUR,KLOR-CON Take 1 tablet (20 mEq total) by mouth daily. Start taking on:  05/22/2016   prednisoLONE acetate 1 % ophthalmic suspension Commonly known as:  PRED FORTE Place 1 drop into both eyes 4 (four) times daily.   predniSONE 10 MG tablet Commonly known as:  DELTASONE Take 40mg  po daily for 2 days then 30mg  daily for 2 days then 20mg  daily for 2 days then 10mg  daily for 2 days   psyllium 58.6 % packet Commonly known as:  METAMUCIL Take 1 packet by mouth daily as needed (for constipation).   sodium chloride 2 % ophthalmic solution Commonly known as:  MURO 128 Place 1 drop into both eyes 3 (three) times daily.   traMADol-acetaminophen 37.5-325 MG tablet Commonly known as:  ULTRACET Take 1 tablet by mouth every 6 (six) hours as needed for moderate pain or severe pain.       Allergies  Allergen Reactions  . Codeine Diarrhea and Nausea Only  . Iron Nausea And Vomiting    Oral iron causes nausea and vomiting  . Asa [Aspirin] Rash and Other (See Comments)    Petechiae   . Penicillins Rash    Consultations:  None    Procedures/Studies: Dg Chest 2 View  Result Date: 05/18/2016 CLINICAL DATA:  82 year old female with a history of cough and congestion EXAM: CHEST  2 VIEW COMPARISON:  04/21/2015, 12/18/2014 FINDINGS: Cardiomediastinal silhouette unchanged in size and contour. Calcifications of the aortic arch. Coarsened interstitial markings with interlobular septal thickening, similar to comparison. No confluent airspace disease. No pleural effusion. No pneumothorax. Right posterior rib rib posttraumatic deformity. Osteopenia. Degenerative changes of the spine. No displaced fracture identified. IMPRESSION:  Chronic lung changes without evidence of lobar pneumonia. Aortic atherosclerosis. Signed, Dulcy Fanny. Earleen Newport, DO [facility] Electronically Signed   By: Corrie Mckusick D.O.   On: 05/18/2016 14:59   Mr Abdomen W Wo Contrast  Result Date: 05/13/2016 CLINICAL DATA:  Recent abnormal CT. Evaluate right renal cell carcinoma. EXAM: MRI ABDOMEN WITHOUT AND WITH CONTRAST TECHNIQUE: Multiplanar multisequence MR imaging of the abdomen was performed both before and after the administration of intravenous contrast. CONTRAST:  88mL MULTIHANCE GADOBENATE DIMEGLUMINE 529 MG/ML IV SOLN COMPARISON:  None. FINDINGS: Significantly diminished exam detail secondary to respiratory motion artifact. For technologist patient was coughing throughout the exam. The patient also had difficulty with hearing breath hold instructions. Lower chest: Small amount of right pleural fluid. The heart size is enlarged. Hepatobiliary: No mass or other parenchymal abnormality identified. Previous cholecystectomy. Pancreas: No mass, inflammatory changes, or other parenchymal abnormality identified. Spleen:  Within normal limits in size and appearance. Adrenals/Urinary Tract: The adrenal glands appear normal. The lesion of concern involving the lower pole of the right kidney is again identified measuring 1.4 cm, image 11 of series 3. Although exam detail is significantly diminished secondary  to respiratory motion artifact there appears to be avid arterial enhancement of this lesion on the postcontrast images compatible with a solid enhancing lesion and a renal cell carcinoma. Cysts containing hemorrhagic debris is identified arises from the lateral aspect of the left kidney measuring 2.5 cm. Stomach/Bowel: Visualized portions within the abdomen are unremarkable. Vascular/Lymphatic: Aortic atherosclerosis is identified. There is no upper abdominal adenopathy. Other:  No free fluid or fluid collections  identified. Musculoskeletal: No suspicious bone lesions identified. IMPRESSION: 1. Significantly diminished exam detail due to respiratory motion artifact. 2. Again noted is an enhancing lesion involving the lateral aspect of the right kidney compatible with a small renal cell carcinoma. In the future this may be better assessed with CT of the kidneys. 3. Aortic atherosclerosis. Electronically Signed   By: Kerby Moors M.D.   On: 05/13/2016 11:14   Dg Ugi W/high Density W/kub  Result Date: 05/09/2016 CLINICAL DATA:  GERD, early satiety, difficulty swallowing solid liquids EXAM: UPPER GI SERIES WITH KUB TECHNIQUE: After obtaining a scout radiograph a routine upper GI series was performed using thin and high density barium. FLUOROSCOPY TIME:  Fluoroscopy Time:  2 minutes 36 seconds Radiation Exposure Index (if provided by the fluoroscopic device): 26.5 mGy reference air kerma Number of Acquired Spot Images: KUB plus multiple screen captures during fluoroscopy COMPARISON:  CT abdomen pelvis 11/06/2015 FINDINGS: Normal bowel gas pattern. Diffuse osseous demineralization with degenerative changes at caudal lumbar spine. Degenerative changes RIGHT hip joint with evidence of prior LEFT total hip arthroplasty. Diffuse esophageal dysmotility with poor peristaltic waves. Drainage of contrast from the esophagus appears to be primarily gravity dependent. Mild narrowing at the gastroesophageal junction though a 12.5 mm diameter tablet passed into the stomach without obstruction. No definite areas of esophageal wall irregularity or intraluminal filling defect. Stomach distends normally without bowel obstruction. Poor evaluation of gastric mucosa suspect related to small amount retained fluid in the stomach, with no persistent coating of the gastric mucosa seen. Suboptimal visualization of rugal folds and mucosa. No gross evidence of gastric mass. Small duodenal diverticulum at the concave border of the second portion.  Duodenal bulb and sweep otherwise normal appearance. Visualize small bowel loops unremarkable. IMPRESSION: Esophageal dysmotility with clearance of contrast from the esophagus primarily gravity dependent. No definite esophageal obstruction identified. Poor coating of the stomach with suboptimal visualization of gastric mucosa and rugal folds. No evidence of gastric outlet obstruction or duodenal abnormality. Electronically Signed   By: Lavonia Dana M.D.   On: 05/09/2016 11:05   Dg Swallowing Func-speech Pathology  Result Date: 05/20/2016 Objective Swallowing Evaluation: Type of Study: MBS-Modified Barium Swallow Study Patient Details Name: MAKAYLIA HEWETT MRN: [id number] Date of Birth: 1923/03/07  Today's Date: 05/20/2016 Time: SLP Start Time (ACUTE ONLY): 1145-SLP Stop Time (ACUTE ONLY): 1215 SLP Time Calculation (min) (ACUTE ONLY): 30 min  Past Medical History:    Past Medical History: Diagnosis Date . Aneurysm, thoracic aortic ([place])  . Aortic insufficiency  . Ascending aortic aneurysm ([place])   CT in 2012 - 4.2x4.2cm . Bladder infection   h/o . DCIS (ductal carcinoma in situ) of breast 03/14/2013  right breast . Dehydration   HISTORY  . Fall at home 09/10/12 . Hemorrhoids  . Hip fracture ([place])   hip surgery 2001 . History of blood clots   in leg . History of knee surgery  . Hypertension  . Iron deficiency anemia, unspecified 03/14/2013  secondary to GI blood loss . Kidney infection   h/o . Melanoma  of skin ([place]) 03/14/2013 . Melanoma of thigh ([place])   left . Mini stroke ([place])  . S/P colonoscopy March 2010  RMR: friable anal canal hemorrhoids, hyperplastic ascending polyp, adenomatous descending polyp  . Stomach ulcer   secondary to h.pylori, s/p treatment . Thyroid condition  . Venous stasis   edema  Past Surgical History:     Past Surgical History: Procedure Laterality Date . APPENDECTOMY  1942 . BREAST LUMPECTOMY  1998 . CATARACT EXTRACTION, BILATERAL   . CHOLECYSTECTOMY   .  COLONOSCOPY  06/05/11  pancolonic diverticulosis/ileal reosion/abnormal anorectal junction s/p biopsy: path for small intestine and Ti was benign with non-villous atrophy, rectal biopsy with prominent prolapse changes, no acute inflammation . CORNEAL TRANSPLANT Bilateral 2013 . ENTEROSCOPY N/A 06/13/2013  SWF:UXNA Gastritis/GI BLEED MOST LIKELY DUE TO DUODENAL ULCERS, AND ? AVMs . ESOPHAGOGASTRODUODENOSCOPY  06/05/11  small hiatal hernia; + H.PYLORI GASTRITIS, s/p 5 days of Pylera, unable to finish due to N/V . ESOPHAGOGASTRODUODENOSCOPY N/A 06/13/2013  Dr. Oneida Alar- see enteroscopy . ESOPHAGOGASTRODUODENOSCOPY (EGD) WITH ESOPHAGEAL DILATION N/A 06/08/2013  TFT:DDUKG hiatal hernia;  otherwise normal EGD s/p dilation . GIVENS CAPSULE STUDY N/A 06/08/2013  Procedure: GIVENS CAPSULE STUDY;  Surgeon: Daneil Dolin, MD;  Location: [facility];  Service: Endoscopy;  Laterality: N/A; . HEMORRHOID BANDING   . KNEE SURGERY Right 05/2006  total right . LEG SURGERY   . MELANOMA EXCISION Left 08/2006  left leg excision . NM MYOCAR PERF WALL MOTION  03/27/2010  dipyridamole; small reversible basal to mid-septal defect (?artifact), post-stress EF 55%, low risk scan  . PARTIAL HYSTERECTOMY  1976 . TONSILLECTOMY   . TOTAL HIP ARTHROPLASTY Left 2001&2004   X 2 FOR LEFT HIP . TRANSTHORACIC ECHOCARDIOGRAM  10/06/2012  EF 55-60%, mod eccentric hypertrophy, grade 2 diastolic dysfunction; mildly calcifed AV annulus with moderate regurg; aortic root mildly dilated; LA severely dailted; PA peak pressure 62mHg . VARICOSE VEIN SURGERY    HPI: Patient is a 82 year old woman with a history of HTN, PUD, chronic venous stasis, anemia, and melanoma of the skin, who presented to the ED on 05/18/2016 with chest congestion and a cough. In the ED, she was febrile with a temperature 100.3 and hemodynamically stable. Her oxygen saturation was 83% on room air. Her lab data were significant for hemoglobin of 11.5, platelet count of 130,  and potassium of 3.1. Her urinalysis revealed too numerous to count WBCs and many bacteria. Chest x-ray revealed chronic lung changes without pneumonia. She was admitted for further evaluation and management. Subjective: "I still have a cough."   Assessment / Plan / Recommendation  CHL IP CLINICAL IMPRESSIONS 05/20/2016 Therapy Diagnosis Suspected primary esophageal dysphagia Clinical Impression Oropharyngeal swallow essentially WNL with the exception of slight premature spillage with straw sips and when swallowing thin barium with barium tablet resulting in underepiglottic coating without deep penetration into laryngeal vestibule and mild retention of thin liquid residuals noted in pyriforms to which pt did not appear sensate and required verbal cue to "swallow again". The trace pooling only occurred with sequential straw sips and not with cup sips. Cricopharyngeus appeared prominent and esophageal sweep revealed retention of food/barium in mid and distal esophagus with air filled esophagus in proximal portion. Pt did experience some retrograde movement of retained barium/solids which is consistent with recent barium swallow (esophageal emptying was essentially gravity dependent) a couple weeks ago. Barium tablet appeared to traverse through esophagus in timely fashion before stasis noted in what appeared to be a hiatal hernia (could  not be confirmed without presence of radiologist). Pt subjectively reported globus sensation and shortness of breath and pointed to pharynx, however suspect this was referred symptoms from esophagus (air filled and retention of solids). Recommend self regulated regular textures and thin liquids and avoid use of straws. Pt encouraged to swallow 2x with each sip of liquid and implement reflux precautions (sit upright for all eating/drinking and remain upright 30-60 minutes following, consume frequent, smaller meals, and avoid perceived troublesome foods (breads and meats unless finely  masticated and taken with liquids). Pt and daughter understand recommendations. Pt may benefit from one follow up SLP session as outpatient or if pt goes to SNF she can be seen by SLP there to ensure diet tolerance and ongoing pt/caregiver education.  Impact on safety and function Mild aspiration risk   CHL IP TREATMENT RECOMMENDATION 05/20/2016 Treatment Recommendations No treatment recommended at this time   Prognosis 05/20/2016 Prognosis for Safe Diet Advancement Good Barriers to Reach Goals -- Barriers/Prognosis Comment --   CHL IP DIET RECOMMENDATION 05/20/2016 SLP Diet Recommendations Regular solids;Thin liquid Liquid Administration via Cup;No straw Medication Administration Whole meds with liquid Compensations Multiple dry swallows after each bite/sip Postural Changes Remain semi-upright after after feeds/meals (Comment);Seated upright at 90 degrees    CHL IP OTHER RECOMMENDATIONS 05/20/2016 Recommended Consults -- Oral Care Recommendations Oral care BID Other Recommendations Clarify dietary restrictions    CHL IP FOLLOW UP RECOMMENDATIONS 05/20/2016 Follow up Recommendations Outpatient SLP    CHL IP FREQUENCY AND DURATION 05/19/2016 Speech Therapy Frequency (ACUTE ONLY) min 2x/week Treatment Duration 1 week       CHL IP ORAL PHASE 05/20/2016 Oral Phase WFL Oral - Pudding Teaspoon -- Oral - Pudding Cup -- Oral - Honey Teaspoon -- Oral - Honey Cup -- Oral - Nectar Teaspoon -- Oral - Nectar Cup -- Oral - Nectar Straw -- Oral - Thin Teaspoon -- Oral - Thin Cup -- Oral - Thin Straw -- Oral - Puree -- Oral - Mech Soft -- Oral - Regular -- Oral - Multi-Consistency -- Oral - Pill -- Oral Phase - Comment --  CHL IP PHARYNGEAL PHASE 05/20/2016 Pharyngeal Phase Impaired Pharyngeal- Pudding Teaspoon -- Pharyngeal -- Pharyngeal- Pudding Cup -- Pharyngeal -- Pharyngeal- Honey Teaspoon -- Pharyngeal -- Pharyngeal- Honey Cup -- Pharyngeal -- Pharyngeal- Nectar Teaspoon -- Pharyngeal -- Pharyngeal-  Nectar Cup -- Pharyngeal -- Pharyngeal- Nectar Straw -- Pharyngeal -- Pharyngeal- Thin Teaspoon -- Pharyngeal -- Pharyngeal- Thin Cup Delayed swallow initiation-vallecula Pharyngeal -- Pharyngeal- Thin Straw Delayed swallow initiation-vallecula;Pharyngeal residue - pyriform Pharyngeal -- Pharyngeal- Puree WFL Pharyngeal -- Pharyngeal- Mechanical Soft -- Pharyngeal -- Pharyngeal- Regular WFL Pharyngeal -- Pharyngeal- Multi-consistency -- Pharyngeal -- Pharyngeal- Pill WFL Pharyngeal -- Pharyngeal Comment --   CHL IP CERVICAL ESOPHAGEAL PHASE 05/20/2016 Cervical Esophageal Phase Impaired Pudding Teaspoon -- Pudding Cup -- Honey Teaspoon -- Honey Cup -- Nectar Teaspoon -- Nectar Cup -- Nectar Straw -- Thin Teaspoon -- Thin Cup -- Thin Straw -- Puree Prominent cricopharyngeal segment Mechanical Soft -- Regular -- Multi-consistency -- Pill -- Cervical Esophageal Comment --  Thank you,  Genene Churn, [place]  No flowsheet data found.  PORTER,DABNEY 05/20/2016, 1:25 PM                   Subjective: Feeling better. Having difficulty taking meds when she takes sips of water with straw. Overall better.  Discharge Exam: Vitals:   05/21/16 1039 05/21/16 1101  BP: 105/74   Pulse: 68 (!) 101  Resp:  Temp:     Vitals:   05/21/16 0747 05/21/16 0956 05/21/16 1039 05/21/16 1101  BP:  (!) 161/57 105/74   Pulse:  68 68 (!) 101  Resp:  18    Temp:  97.9 F (36.6 C)    TempSrc:      SpO2: 94% 96% 96% 93%  Weight:      Height:        General: Pt is alert, awake, not in acute distress Cardiovascular: RRR, S1/S2 +, no rubs, no gallops Respiratory: coarse crackles at bases, no wheezing Abdominal: Soft, NT, ND, bowel sounds + Extremities: no edema, no cyanosis    The results of significant diagnostics from this hospitalization (including imaging, microbiology, ancillary and laboratory) are listed below for reference.     Microbiology: Recent Results (from the past 240  hour(s))  Urine culture     Status: Abnormal   Collection Time: 05/18/16  4:04 PM  Result Value Ref Range Status   Specimen Description URINE, CATHETERIZED  Final   Special Requests NONE  Final   Culture MULTIPLE SPECIES PRESENT, SUGGEST RECOLLECTION (A)  Final   Report Status 05/20/2016 FINAL  Final  Urine culture     Status: None   Collection Time: 05/18/16 10:00 PM  Result Value Ref Range Status   Specimen Description URINE, CLEAN CATCH  Final   Special Requests NONE  Final   Culture NO GROWTH Performed at [facility]   Final   Report Status 05/20/2016 FINAL  Final     Labs: BNP (last 3 results) No results for input(s): BNP in the last 8760 hours. Basic Metabolic Panel:  Recent Labs Lab 05/18/16 1555 05/19/16 0702 05/19/16 0830 05/20/16 0645 05/21/16 0616  NA 139 142  --  143 142  K 3.1* 3.2*  --  3.5 2.9*  CL 104 104  --  106 103  CO2 31 31  --  30 31  GLUCOSE 105* 110*  --  120* 103*  BUN 12 13  --  14 17  CREATININE 0.83 0.86  --  0.80 0.82  CALCIUM 8.3* 8.2*  --  8.4* 8.4*  MG  --   --  2.2  --   --    Liver Function Tests: No results for input(s): AST, ALT, ALKPHOS, BILITOT, PROT, ALBUMIN in the last 168 hours. No results for input(s): LIPASE, AMYLASE in the last 168 hours. No results for input(s): AMMONIA in the last 168 hours. CBC:  Recent Labs Lab 05/18/16 1555 05/19/16 0702 05/20/16 0645  WBC 6.7 4.6 4.9  NEUTROABS 4.8  --   --   HGB 11.5* 10.0* 10.8*  HCT 36.9 33.1* 34.8*  MCV 92.7 92.7 92.8  PLT 130* 105* 120*   Cardiac Enzymes: No results for input(s): CKTOTAL, CKMB, CKMBINDEX, TROPONINI in the last 168 hours. BNP: Invalid input(s): POCBNP CBG: No results for input(s): GLUCAP in the last 168 hours. D-Dimer No results for input(s): DDIMER in the last 72 hours. Hgb A1c No results for input(s): HGBA1C in the last 72 hours. Lipid Profile No results for input(s): CHOL, HDL, LDLCALC, TRIG, CHOLHDL, LDLDIRECT in the last 72  hours. Thyroid function studies  Recent Labs  05/20/16 0645  TSH 0.464   Anemia work up  Recent Labs  05/20/16 0645  VITAMINB12 257  FOLATE 19.2  FERRITIN 19  TIBC 388  IRON 24*   Urinalysis    Component Value Date/Time   COLORURINE YELLOW 05/18/2016 [place] 05/18/2016 1529  LABSPEC 1.025 05/18/2016 1529   PHURINE 6.0 05/18/2016 1529   GLUCOSEU NEGATIVE 05/18/2016 1529   HGBUR LARGE (A) 05/18/2016 1529   BILIRUBINUR SMALL (A) 05/18/2016 1529   KETONESUR NEGATIVE 05/18/2016 1529   PROTEINUR TRACE (A) 05/18/2016 1529   UROBILINOGEN 1.0 12/18/2014 1735   NITRITE NEGATIVE 05/18/2016 1529   LEUKOCYTESUR MODERATE (A) 05/18/2016 1529   Sepsis Labs Invalid input(s): PROCALCITONIN,  WBC,  LACTICIDVEN Microbiology Recent Results (from the past 240 hour(s))  Urine culture     Status: Abnormal   Collection Time: 05/18/16  4:04 PM  Result Value Ref Range Status   Specimen Description URINE, CATHETERIZED  Final   Special Requests NONE  Final   Culture MULTIPLE SPECIES PRESENT, SUGGEST RECOLLECTION (A)  Final   Report Status 05/20/2016 FINAL  Final  Urine culture     Status: None   Collection Time: 05/18/16 10:00 PM  Result Value Ref Range Status   Specimen Description URINE, CLEAN CATCH  Final   Special Requests NONE  Final   Culture NO GROWTH Performed at [facility]   Final   Report Status 05/20/2016 FINAL  Final     Time coordinating discharge: Over 30 minutes  SIGNED:   Kathie Dike, MD [facility] 05/21/2016, 11:51 AM If 7PM-7AM, please contact night-coverage [email or web address] Password [email or web address]

## 2016-05-21 NOTE — Evaluation (Signed)
Physical Therapy Evaluation Patient Details Name: [name] [name] MRN: [id number] DOB: 1923-03-13 Today's Date: 05/21/2016   History of Present Illness  82 y.o. female with hx of HTN, melanoma of skin, PUD, venous stasis, anemia, UTI, thoracic aorta aneurysm who presents to ED today with report per family of cough and congestion started yesterday.    In ED O2 sats dropped into 80's during a coughing spell.  Chest x-ray revealed chronic lung changes without pneumonia. She was admitted for COPD exacerbation causing hypoxic respiratory failure  Clinical Impression  Pt received in bed, 2 dtr's present, and pt is agreeable to PT evaluation.  Pt states that she is independent with ADL's, and ambulates with a RW or a Rollator at baseline.  Pt and dtr state that pt likes to get out and about, and still drives.  Pt demonstrated all functional mobility and Mod (I) during PT evaluation today, and maintain SpO2 >93% on RA during all mobility activites as well.  At this point, pt does not demonstrate any skilled PT needs, and therefore will sign off.      Follow Up Recommendations No PT follow up    Equipment Recommendations  None recommended by PT    Recommendations for Other Services       Precautions / Restrictions Precautions Precautions: None Restrictions Weight Bearing Restrictions: No      Mobility  Bed Mobility Overal bed mobility: Modified Independent                Transfers Overall transfer level: Modified independent                  Ambulation/Gait Ambulation/Gait assistance: Modified independent (Device/Increase time) Ambulation Distance (Feet): 200 Feet Assistive device: Rolling walker (2 wheeled) Gait Pattern/deviations: WFL(Within Functional Limits)   Gait velocity interpretation: <1.8 ft/sec, indicative of risk for recurrent falls General Gait Details: Decreased gait speed with need for 1 standing rest break due to feeling fatigued, however SpO2 remained  >93%.  Stairs            Wheelchair Mobility    Modified Rankin (Stroke Patients Only)       Balance Overall balance assessment: Needs assistance   Sitting balance-Leahy Scale: Normal     Standing balance support: Bilateral upper extremity supported Standing balance-Leahy Scale: Good                               Pertinent Vitals/Pain Pain Assessment: No/denies pain    Home Living   Living Arrangements: Alone   Type of Home: Apartment (independent living - [place] [address]) Home Access: Stairs to enter   [employer] of Steps: 1 step up onto the porch.   Home Layout: One level Home Equipment: [place] - 4 wheels;Walker - 2 wheels;Shower seat      Prior Function Level of Independence: Independent with assistive device(s)   Gait / Transfers Assistance Needed: Pt uses a Rollator in the house and a RW outside.   ADL's / Homemaking Assistance Needed: Pt is still driving, and will go up to 10 miles away.  Pt is independent with dressing/bathing.          Hand Dominance   Dominant Hand: Right    Extremity/Trunk Assessment   Upper Extremity Assessment: Overall WFL for tasks assessed           Lower Extremity Assessment: Overall WFL for tasks assessed  Communication   Communication: HOH  Cognition Arousal/Alertness: Awake/alert Behavior During Therapy: WFL for tasks assessed/performed Overall Cognitive Status: Within Functional Limits for tasks assessed                      General Comments      Exercises     Assessment/Plan    PT Assessment Patent does not need any further PT services  PT Problem List            PT Treatment Interventions      PT Goals (Current goals can be found in the Care Plan section)  Acute Rehab PT Goals PT Goal Formulation: All assessment and education complete, DC therapy    Frequency     Barriers to discharge        Co-evaluation               End  of Session Equipment Utilized During Treatment: Gait belt;Oxygen Activity Tolerance: Patient tolerated treatment well Patient left: with call bell/phone within reach;with family/visitor present (Sitting on the EOB) Nurse Communication: Mobility status Horris Latino, RN notified of pt's mobiltiy.)    Functional Assessment Tool Used: [employer] "6-clicks"  Functional Limitation: Mobility: Walking and moving around Mobility: Walking and Moving Around Current Status [phone]): At least 1 percent but less than 20 percent impaired, limited or restricted Mobility: Walking and Moving Around Goal Status [phone]): At least 1 percent but less than 20 percent impaired, limited or restricted Mobility: Walking and Moving Around Discharge Status [phone]): At least 1 percent but less than 20 percent impaired, limited or restricted    Time: 1030-1058 PT Time Calculation (min) (ACUTE ONLY): 28 min   Charges:   PT Evaluation $PT Eval Low Complexity: 1 Procedure PT Treatments $Gait Training: 8-22 mins   PT G Codes:   PT G-Codes **NOT FOR INPATIENT CLASS** Functional Assessment Tool Used: [employer] "6-clicks"  Functional Limitation: Mobility: Walking and moving around Mobility: Walking and Moving Around Current Status [phone]): At least 1 percent but less than 20 percent impaired, limited or restricted Mobility: Walking and Moving Around Goal Status [phone]): At least 1 percent but less than 20 percent impaired, limited or restricted Mobility: Walking and Moving Around Discharge Status [phone]): At least 1 percent but less than 20 percent impaired, limited or restricted    Beth Hazel Leveille, PT, DPT X: [phone]

## 2016-05-21 NOTE — Progress Notes (Signed)
[name] [name] discharged Home per MD order.  Discharge instructions reviewed and discussed with the patient, all questions and concerns answered. Copy of instructionsgiven to patient.    Medication List    TAKE these medications   albuterol 108 (90 Base) MCG/ACT inhaler Commonly known as:  PROVENTIL HFA;VENTOLIN HFA Inhale 1-2 puffs into the lungs every 6 (six) hours as needed for wheezing.   clopidogrel 75 MG tablet Commonly known as:  PLAVIX Take 75 mg by mouth daily with breakfast.   furosemide 20 MG tablet Commonly known as:  LASIX Take 1 tablet (20 mg total) by mouth daily.   gabapentin 100 MG capsule Commonly known as:  NEURONTIN Take 2 capsules (200 mg total) by mouth 3 (three) times daily.   hydrocortisone 25 MG suppository Commonly known as:  ANUSOL-HC Place 1 suppository (25 mg total) rectally every 12 (twelve) hours. What changed:  when to take this  reasons to take this   ipratropium-albuterol 0.5-2.5 (3) MG/3ML Soln Commonly known as:  DUONEB Take 3 mLs by nebulization 3 (three) times daily.   mometasone-formoterol 100-5 MCG/ACT Aero Commonly known as:  DULERA Inhale 2 puffs into the lungs 2 (two) times daily.   ondansetron 4 MG tablet Commonly known as:  ZOFRAN Take 1 tablet (4 mg total) by mouth every 8 (eight) hours as needed for nausea or vomiting. What changed:  when to take this  additional instructions   pantoprazole 40 MG tablet Commonly known as:  PROTONIX Take 1 tablet (40 mg total) by mouth 2 (two) times daily.   potassium chloride SA 20 MEQ tablet Commonly known as:  K-DUR,KLOR-CON Take 1 tablet (20 mEq total) by mouth daily. Start taking on:  05/22/2016   prednisoLONE acetate 1 % ophthalmic suspension Commonly known as:  PRED FORTE Place 1 drop into both eyes 4 (four) times daily.   predniSONE 10 MG tablet Commonly known as:  DELTASONE Take 40mg  po daily for 2 days then 30mg  daily for 2 days then 20mg  daily for 2 days then  10mg  daily for 2 days   psyllium 58.6 % packet Commonly known as:  METAMUCIL Take 1 packet by mouth daily as needed (for constipation).   sodium chloride 2 % ophthalmic solution Commonly known as:  MURO 128 Place 1 drop into both eyes 3 (three) times daily.   traMADol-acetaminophen 37.5-325 MG tablet Commonly known as:  ULTRACET Take 1 tablet by mouth every 6 (six) hours as needed for moderate pain or severe pain.       Patients skin is clean, dry and intact, no evidence of skin break down. IV site discontinued and catheter remains intact. Site without signs and symptoms of complications. Dressing and pressure applied.  Patient escorted to car by NT in a wheelchair,  no distress noted upon discharge.  Ralene Muskrat [name] 05/21/2016 3:40 PM

## 2016-05-21 NOTE — Care Management (Signed)
Patient discharged home with nebulizer machine provided by [facility]. [place] RN services arranged and OP SLP referral faxed out to [facility].

## 2016-05-22 ENCOUNTER — Telehealth (HOSPITAL_COMMUNITY): Payer: Self-pay

## 2016-05-22 ENCOUNTER — Encounter (HOSPITAL_COMMUNITY): Payer: Self-pay | Admitting: Speech Pathology

## 2016-05-22 ENCOUNTER — Ambulatory Visit (HOSPITAL_COMMUNITY): Payer: Medicare Other | Admitting: Speech Pathology

## 2016-05-22 DIAGNOSIS — R1312 Dysphagia, oropharyngeal phase: Secondary | ICD-10-CM

## 2016-05-22 NOTE — Therapy (Signed)
[place], [place], [address] Phone: [phone]   Fax:  [phone]  Speech Language Pathology Evaluation/Clinical Swallow Evaluation  Patient Details  Name: [name] [name] MRN: [id number] Date of Birth: Sep 15, 1922 No Data Recorded  Encounter Date: 05/22/2016      End of Session - 05/22/16 1934    Visit Number 1   Number of Visits 1   Authorization Type Medicare   SLP Start Time 1440   SLP Stop Time  1522   SLP Time Calculation (min) 42 min   Activity Tolerance Patient tolerated treatment well      Past Medical History:  Diagnosis Date  . Aneurysm, thoracic aortic ([place])   . Aortic insufficiency   . Ascending aortic aneurysm ([place])    CT in 2012 - 4.2x4.2cm  . Bladder infection    h/o  . 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 Dehydration    HISTORY   . Fall at home 09/10/12  . Hemorrhoids   . Hip fracture ([place])    hip surgery 2001  . History of blood clots    in leg  . History of knee surgery   . Hypertension   . Iron deficiency anemia, unspecified 03/14/2013   secondary to GI blood loss  . Kidney infection    h/o  . Melanoma of skin ([place]) 03/14/2013  . Melanoma of thigh ([place])    left  . Mini stroke ([place])   . S/P colonoscopy March 2010   RMR: friable anal canal hemorrhoids, hyperplastic ascending polyp, adenomatous descending polyp   . Stomach ulcer    secondary to h.pylori, s/p treatment  . Thyroid condition   . Venous stasis    edema    Past Surgical History:  Procedure Laterality Date  . APPENDECTOMY  1942  . BREAST LUMPECTOMY  1998  . CATARACT EXTRACTION, BILATERAL    . CHOLECYSTECTOMY    . COLONOSCOPY  06/05/11   pancolonic diverticulosis/ileal reosion/abnormal anorectal junction s/p biopsy: path for small intestine and Ti was benign with non-villous atrophy, rectal biopsy with prominent prolapse changes, no acute inflammation  . CORNEAL TRANSPLANT  Bilateral 2013  . ENTEROSCOPY N/A 06/13/2013   DQQ:IWLN Gastritis/GI BLEED MOST LIKELY DUE TO DUODENAL ULCERS, AND ? AVMs  . ESOPHAGOGASTRODUODENOSCOPY  06/05/11   small hiatal hernia; + H.PYLORI GASTRITIS, s/p 5 days of Pylera, unable to finish due to N/V  . ESOPHAGOGASTRODUODENOSCOPY N/A 06/13/2013   Dr. Oneida Alar- see enteroscopy  . ESOPHAGOGASTRODUODENOSCOPY (EGD) WITH ESOPHAGEAL DILATION N/A 06/08/2013   LGX:QJJHE hiatal hernia;  otherwise normal EGD s/p dilation  . GIVENS CAPSULE STUDY N/A 06/08/2013   Procedure: GIVENS CAPSULE STUDY;  Surgeon: Daneil Dolin, MD;  Location: [facility] SUITE;  Service: Endoscopy;  Laterality: N/A;  . HEMORRHOID BANDING    . KNEE SURGERY Right 05/2006   total right  . LEG SURGERY    . MELANOMA EXCISION Left 08/2006   left leg excision  . NM MYOCAR PERF WALL MOTION  03/27/2010   dipyridamole; small reversible basal to mid-septal defect (?artifact), post-stress EF 55%, low risk scan   . PARTIAL HYSTERECTOMY  1976  . TONSILLECTOMY    . TOTAL HIP ARTHROPLASTY Left 2001&2004    X 2 FOR LEFT HIP  . TRANSTHORACIC ECHOCARDIOGRAM  10/06/2012   EF 55-60%, mod eccentric hypertrophy, grade 2 diastolic dysfunction; mildly calcifed AV annulus with moderate regurg; aortic root mildly dilated; LA severely dailted;  PA peak pressure 84mHg  . VARICOSE VEIN SURGERY      There were no vitals filed for this visit.       Prior Functional Status - 05/22/16 1926      Prior Functional Status   Cognitive/Linguistic Baseline Within functional limits   Type of Home Apartment         General - 05/22/16 1927      General Information   Date of Onset 05/18/16   HPI Patient is a 82 year old woman with a history of HTN, PUD, chronic venous stasis, anemia, and melanoma of the skin, who presented to the ED on 05/18/2016 with chest congestion and a cough. In the ED, she was febrile with a temperature 100.3 and hemodynamically stable. Her oxygen saturation was 83% on room air. Her  lab data were significant for hemoglobin of 11.5, platelet count of 130, and potassium of 3.1. Her urinalysis revealed too numerous to count WBCs and many bacteria. Chest x-ray revealed chronic lung changes without pneumonia. She was discharged from [facility] yesterday and was seen for MBSS during that stay.   Type of Study Bedside Swallow Evaluation   Previous Swallow Assessment MBS completed 05/20/16 with recommendation for self regulated regular textures and thin liquids; no straws; reflux precautions   Diet Prior to this Study Regular;Thin liquids   Temperature Spikes Noted No   Respiratory Status Room air   History of Recent Intubation No   Behavior/Cognition Alert;Cooperative;Pleasant mood   Oral Cavity Assessment Within Functional Limits   Oral Care Completed by SLP No   Oral Cavity - Dentition Dentures, top;Dentures, bottom   Vision Functional for self-feeding   Self-Feeding Abilities Able to feed self   Patient Positioning Upright in chair   Baseline Vocal Quality Normal   Volitional Cough Strong   Volitional Swallow Able to elicit          Oral Motor/Sensory Function - 05/22/16 1932      Oral Motor/Sensory Function   Overall Oral Motor/Sensory Function Within functional limits         Ice Chips - 05/22/16 1933      Ice Chips   Ice chips Not tested         Thin Liquid - 05/22/16 1933      Thin Liquid   Thin Liquid Within functional limits   Presentation Cup;Self Fed         Nectar thick liquid - 05/22/16 1933      Nectar Thick Liquid   Nectar Thick Liquid Not tested         Honey Thick Liquid - 05/22/16 1933      Honey Thick Liquid   Honey Thick Liquid Not tested         Puree - 05/22/16 1933      Puree   Puree Impaired   Presentation Spoon         Solid - 05/22/16 1933      Solid   Solid Within functional limits   Presentation Self Fed             SLP Education - 05/22/16 1925    Education provided Yes   Education  Details Written recommendations regarding esophageal swallowing precautions   Person(s) Educated Patient   Methods Explanation;Handout   Comprehension Verbalized understanding     MBSS from 05/20/16:  Oropharyngeal swallow essentially WNL with the exception of slight premature spillage with straw sips and when swallowing thin barium with barium tablet resulting in underepiglottic  coating without deep penetration into laryngeal vestibule and mild retention of thin liquid residuals noted in pyriforms to which pt did not appear sensate and required verbal cue to "swallow again". The trace pooling only occurred with sequential straw sips and not with cup sips. Cricopharyngeus appeared prominent and esophageal sweep revealed retention of food/barium in mid and distal esophagus with air filled esophagus in proximal portion. Pt did experience some retrograde movement of retained barium/solids which is consistent with recent barium swallow (esophageal emptying was essentially gravity dependent) a couple weeks ago. Barium tablet appeared to traverse through esophagus in timely fashion before stasis noted in what appeared to be a hiatal hernia (could not be confirmed without presence of radiologist).  Pt subjectively reported globus sensation and shortness of breath and pointed to pharynx, however suspect this was referred symptoms from esophagus (air filled and retention of solids). Recommend self regulated regular textures and thin liquids and avoid use of straws. Pt encouraged to swallow 2x with each sip of liquid and implement reflux precautions (sit upright for all eating/drinking and remain upright 30-60 minutes following, consume frequent, smaller meals, and avoid perceived troublesome foods (breads and meats unless finely masticated and taken with liquids). Pt and daughter understand recommendations. Pt may benefit from one follow up SLP session as outpatient or if pt goes to SNF she can be seen by SLP there  to ensure diet tolerance and ongoing pt/caregiver education.          Plan - 18-Jun-2016 1934    Clinical Impression Statement Pt seen in office for clinical swallow assessment following recent discharge from hospital. Pt demonstrated improved respiratory support and no longer needing O2 support. MBSS was reviewed with pt and recommendations reiterated and provided in written format. Recommend self regulated regular textures and thin liquids and avoid use of straws. Pt encouraged to swallow 2x with each sip of liquid and implement reflux precautions (sit upright for all eating/drinking and remain upright 30-60 minutes following, consume frequent, smaller meals, and avoid perceived troublesome foods (breads and meats unless finely masticated and taken with liquids). No further SLP services indicated at this time. Pt was given my contact information should she have further questions. She was encouraged to notify her doctor if she notes increased difficulty with swallow or difficulty breathing. Pt/daughter in agreement with plan of care.   Treatment/Interventions SLP instruction and feedback;Compensatory strategies;Patient/family education   Potential to Achieve Goals Good   Consulted and Agree with Plan of Care Patient      Patient will benefit from skilled therapeutic intervention in order to improve the following deficits and impairments:   Dysphagia, oropharyngeal phase      G-Codes - 06/18/2016 1938    Functional Assessment Tool Used clinical judgment   Functional Limitations Swallowing   Swallow Current Status (Y7062) At least 1 percent but less than 20 percent impaired, limited or restricted   Swallow Goal Status (B7628) At least 1 percent but less than 20 percent impaired, limited or restricted   Swallow Discharge Status [phone]) At least 1 percent but less than 20 percent impaired, limited or restricted      Problem List Patient Active Problem List   Diagnosis Date Noted  . Dysphagia  05/20/2016  . Hypokalemia 05/19/2016  . Thrombocytopenia ([place]) 05/19/2016  . Normocytic anemia 05/19/2016  . Acute respiratory failure with hypoxia ([place]) 05/18/2016  . Acute bronchitis 05/18/2016  . Bilateral lower extremity edema 05/18/2016  . Degenerative joint disease involving multiple joints 05/18/2016  .  Pyuria 05/18/2016  . DNR (do not resuscitate) 05/18/2016  . Constipation 02/09/2015  . Occult GI bleeding 08/23/2013  . IDA (iron deficiency anemia) 06/07/2013  . Melanoma of skin ([place]) 03/14/2013  . DCIS (ductal carcinoma in situ) of breast 03/14/2013  . Iron deficiency anemia, unspecified 03/14/2013  . BPPV (benign paroxysmal positional vertigo) 02/10/2013  . RBBB 02/10/2013  . Ascending aortic aneurysm ([place]) 02/10/2013  . Aortic insufficiency 02/10/2013  . S/P total knee replacement 08/14/2011  . Helicobacter pylori gastritis 07/14/2011  . Anemia 02/04/2011  . Bladder infection   . [name], HIP 05/29/2009   Thank you,  Genene Churn, [place]  [facility] 05/22/2016, 7:38 PM  [place] [address] [place], [place], [address] Phone: [phone]   Fax:  [phone]  Name: [name] [name] MRN: [id number] Date of Birth: 1922/08/08

## 2016-05-22 NOTE — Telephone Encounter (Signed)
05/22/16 daughter called to say that pt had been in the hospital and Dabney saw her and did a swallow test.  She was wondering if she still needed to come in today.  I told her that was entirely up to her mom.... Because she said that Dabney did want to see her again.  She will wait until her mom wakes up and will call us back before lunch to let us know if she will be coming in today.

## 2016-05-23 ENCOUNTER — Ambulatory Visit (INDEPENDENT_AMBULATORY_CARE_PROVIDER_SITE_OTHER): Payer: Medicare Other | Admitting: Urology

## 2016-05-23 DIAGNOSIS — R1310 Dysphagia, unspecified: Secondary | ICD-10-CM | POA: Diagnosis not present

## 2016-05-23 DIAGNOSIS — F17211 Nicotine dependence, cigarettes, in remission: Secondary | ICD-10-CM | POA: Diagnosis not present

## 2016-05-23 DIAGNOSIS — I10 Essential (primary) hypertension: Secondary | ICD-10-CM | POA: Diagnosis not present

## 2016-05-23 DIAGNOSIS — C641 Malignant neoplasm of right kidney, except renal pelvis: Secondary | ICD-10-CM

## 2016-05-23 DIAGNOSIS — J690 Pneumonitis due to inhalation of food and vomit: Secondary | ICD-10-CM | POA: Diagnosis not present

## 2016-05-23 DIAGNOSIS — D509 Iron deficiency anemia, unspecified: Secondary | ICD-10-CM | POA: Diagnosis not present

## 2016-05-23 DIAGNOSIS — I351 Nonrheumatic aortic (valve) insufficiency: Secondary | ICD-10-CM | POA: Diagnosis not present

## 2016-05-23 DIAGNOSIS — J209 Acute bronchitis, unspecified: Secondary | ICD-10-CM | POA: Diagnosis not present

## 2016-05-28 ENCOUNTER — Other Ambulatory Visit (HOSPITAL_COMMUNITY)
Admission: RE | Admit: 2016-05-28 | Discharge: 2016-05-28 | Disposition: A | Discharge status: Home / Self Care | Payer: Medicare Other | Source: Skilled Nursing Facility | Attending: Family Medicine | Admitting: Family Medicine

## 2016-05-28 DIAGNOSIS — R1310 Dysphagia, unspecified: Secondary | ICD-10-CM | POA: Insufficient documentation

## 2016-05-28 DIAGNOSIS — I351 Nonrheumatic aortic (valve) insufficiency: Secondary | ICD-10-CM | POA: Diagnosis not present

## 2016-05-28 DIAGNOSIS — J189 Pneumonia, unspecified organism: Secondary | ICD-10-CM | POA: Insufficient documentation

## 2016-05-28 DIAGNOSIS — D509 Iron deficiency anemia, unspecified: Secondary | ICD-10-CM | POA: Diagnosis not present

## 2016-05-28 DIAGNOSIS — J690 Pneumonitis due to inhalation of food and vomit: Secondary | ICD-10-CM | POA: Diagnosis not present

## 2016-05-28 DIAGNOSIS — I10 Essential (primary) hypertension: Secondary | ICD-10-CM | POA: Diagnosis not present

## 2016-05-28 DIAGNOSIS — J209 Acute bronchitis, unspecified: Secondary | ICD-10-CM | POA: Diagnosis not present

## 2016-05-28 LAB — BASIC METABOLIC PANEL
ANION GAP: 9 (ref 5–15)
BUN: 21 mg/dL — ABNORMAL HIGH (ref 6–20)
CALCIUM: 8.4 mg/dL — AB (ref 8.9–10.3)
CO2: 29 mmol/L (ref 22–32)
Chloride: 101 mmol/L (ref 101–111)
Creatinine, Ser: 1.16 mg/dL — ABNORMAL HIGH (ref 0.44–1.00)
GFR calc Af Amer: 46 mL/min — ABNORMAL LOW (ref >60)
GFR calc non Af Amer: 40 mL/min — ABNORMAL LOW (ref >60)
GLUCOSE: 124 mg/dL — AB (ref 65–99)
POTASSIUM: 3.5 mmol/L (ref 3.5–5.1)
Sodium: 139 mmol/L (ref 135–145)

## 2016-05-28 LAB — CBC
HEMATOCRIT: 40.3 % (ref 36.0–46.0)
HEMOGLOBIN: 12.5 g/dL (ref 12.0–15.0)
MCH: 28.9 pg (ref 26.0–34.0)
MCHC: 31 g/dL (ref 30.0–36.0)
MCV: 93.3 fL (ref 78.0–100.0)
Platelets: 217 10*3/uL (ref 150–400)
RBC: 4.32 MIL/uL (ref 3.87–5.11)
RDW: 15.1 % (ref 11.5–15.5)
WBC: 9.5 10*3/uL (ref 4.0–10.5)

## 2016-05-30 DIAGNOSIS — D509 Iron deficiency anemia, unspecified: Secondary | ICD-10-CM | POA: Diagnosis not present

## 2016-05-30 DIAGNOSIS — J209 Acute bronchitis, unspecified: Secondary | ICD-10-CM | POA: Diagnosis not present

## 2016-05-30 DIAGNOSIS — E6609 Other obesity due to excess calories: Secondary | ICD-10-CM | POA: Diagnosis not present

## 2016-05-30 DIAGNOSIS — E876 Hypokalemia: Secondary | ICD-10-CM | POA: Diagnosis not present

## 2016-05-30 DIAGNOSIS — Z6831 Body mass index (BMI) 31.0-31.9, adult: Secondary | ICD-10-CM | POA: Diagnosis not present

## 2016-06-02 ENCOUNTER — Ambulatory Visit (INDEPENDENT_AMBULATORY_CARE_PROVIDER_SITE_OTHER): Payer: Medicare Other | Admitting: Orthopedic Surgery

## 2016-06-02 ENCOUNTER — Ambulatory Visit (INDEPENDENT_AMBULATORY_CARE_PROVIDER_SITE_OTHER): Payer: Medicare Other

## 2016-06-02 DIAGNOSIS — Z96651 Presence of right artificial knee joint: Secondary | ICD-10-CM

## 2016-06-02 DIAGNOSIS — C439 Malignant melanoma of skin, unspecified: Secondary | ICD-10-CM | POA: Diagnosis not present

## 2016-06-02 DIAGNOSIS — M1991 Primary osteoarthritis, unspecified site: Secondary | ICD-10-CM | POA: Diagnosis not present

## 2016-06-02 DIAGNOSIS — R0602 Shortness of breath: Secondary | ICD-10-CM | POA: Diagnosis not present

## 2016-06-02 DIAGNOSIS — E876 Hypokalemia: Secondary | ICD-10-CM | POA: Diagnosis not present

## 2016-06-02 DIAGNOSIS — I10 Essential (primary) hypertension: Secondary | ICD-10-CM | POA: Diagnosis not present

## 2016-06-02 NOTE — Progress Notes (Signed)
  Chief Complaint  Patient presents with  . Follow-up    one year follow up Rt TKA   Hpi: The patient is down 82 years old she had a total knee on the right October 2007. She is here for the 10 year follow-up x-ray  System review she has lost some function as she is now using a walker for gait disturbance. It is related to age and not her knee replacement.  Examination   Inspection incision healed nicely without erythema, no tenderness no swelling  Range of motion total range of motion is 110  Stability the knee is stable anterior to posterior as well as medial to lateral  Strength quadriceps strength is normal  Skin no erythema around the skin incision  Cardiovascular mild distal peripheral edema    Medical decision-making section  X-rays ordered with the following personal interpretation   Diagnosis  Plan follow-up as needed related to the total knee

## 2016-06-04 ENCOUNTER — Ambulatory Visit: Payer: Medicare Other | Admitting: Orthopedic Surgery

## 2016-06-05 ENCOUNTER — Ambulatory Visit: Payer: Medicare Other | Admitting: Orthopedic Surgery

## 2016-06-05 DIAGNOSIS — J209 Acute bronchitis, unspecified: Secondary | ICD-10-CM | POA: Diagnosis not present

## 2016-06-05 DIAGNOSIS — D509 Iron deficiency anemia, unspecified: Secondary | ICD-10-CM | POA: Diagnosis not present

## 2016-06-05 DIAGNOSIS — I10 Essential (primary) hypertension: Secondary | ICD-10-CM | POA: Diagnosis not present

## 2016-06-05 DIAGNOSIS — I351 Nonrheumatic aortic (valve) insufficiency: Secondary | ICD-10-CM | POA: Diagnosis not present

## 2016-06-05 DIAGNOSIS — J690 Pneumonitis due to inhalation of food and vomit: Secondary | ICD-10-CM | POA: Diagnosis not present

## 2016-06-05 DIAGNOSIS — R1310 Dysphagia, unspecified: Secondary | ICD-10-CM | POA: Diagnosis not present

## 2016-06-09 DIAGNOSIS — J690 Pneumonitis due to inhalation of food and vomit: Secondary | ICD-10-CM | POA: Diagnosis not present

## 2016-06-09 DIAGNOSIS — R1310 Dysphagia, unspecified: Secondary | ICD-10-CM | POA: Diagnosis not present

## 2016-06-09 DIAGNOSIS — J209 Acute bronchitis, unspecified: Secondary | ICD-10-CM | POA: Diagnosis not present

## 2016-06-09 DIAGNOSIS — I351 Nonrheumatic aortic (valve) insufficiency: Secondary | ICD-10-CM | POA: Diagnosis not present

## 2016-06-09 DIAGNOSIS — I10 Essential (primary) hypertension: Secondary | ICD-10-CM | POA: Diagnosis not present

## 2016-06-09 DIAGNOSIS — D509 Iron deficiency anemia, unspecified: Secondary | ICD-10-CM | POA: Diagnosis not present

## 2016-06-11 ENCOUNTER — Other Ambulatory Visit: Payer: Self-pay | Admitting: Orthopedic Surgery

## 2016-06-11 DIAGNOSIS — M5416 Radiculopathy, lumbar region: Secondary | ICD-10-CM

## 2016-06-11 DIAGNOSIS — M48061 Spinal stenosis, lumbar region without neurogenic claudication: Secondary | ICD-10-CM

## 2016-06-11 DIAGNOSIS — M25552 Pain in left hip: Secondary | ICD-10-CM

## 2016-06-16 DIAGNOSIS — E876 Hypokalemia: Secondary | ICD-10-CM | POA: Diagnosis not present

## 2016-06-18 DIAGNOSIS — D509 Iron deficiency anemia, unspecified: Secondary | ICD-10-CM | POA: Diagnosis not present

## 2016-06-18 DIAGNOSIS — J690 Pneumonitis due to inhalation of food and vomit: Secondary | ICD-10-CM | POA: Diagnosis not present

## 2016-06-18 DIAGNOSIS — R1310 Dysphagia, unspecified: Secondary | ICD-10-CM | POA: Diagnosis not present

## 2016-06-18 DIAGNOSIS — I10 Essential (primary) hypertension: Secondary | ICD-10-CM | POA: Diagnosis not present

## 2016-06-18 DIAGNOSIS — I351 Nonrheumatic aortic (valve) insufficiency: Secondary | ICD-10-CM | POA: Diagnosis not present

## 2016-06-18 DIAGNOSIS — J209 Acute bronchitis, unspecified: Secondary | ICD-10-CM | POA: Diagnosis not present

## 2016-06-20 DIAGNOSIS — Z1389 Encounter for screening for other disorder: Secondary | ICD-10-CM | POA: Diagnosis not present

## 2016-06-20 DIAGNOSIS — E6609 Other obesity due to excess calories: Secondary | ICD-10-CM | POA: Diagnosis not present

## 2016-06-20 DIAGNOSIS — Z6832 Body mass index (BMI) 32.0-32.9, adult: Secondary | ICD-10-CM | POA: Diagnosis not present

## 2016-06-20 DIAGNOSIS — R350 Frequency of micturition: Secondary | ICD-10-CM | POA: Diagnosis not present

## 2016-06-24 DIAGNOSIS — R1310 Dysphagia, unspecified: Secondary | ICD-10-CM | POA: Diagnosis not present

## 2016-06-24 DIAGNOSIS — J209 Acute bronchitis, unspecified: Secondary | ICD-10-CM | POA: Diagnosis not present

## 2016-06-24 DIAGNOSIS — J690 Pneumonitis due to inhalation of food and vomit: Secondary | ICD-10-CM | POA: Diagnosis not present

## 2016-06-24 DIAGNOSIS — I10 Essential (primary) hypertension: Secondary | ICD-10-CM | POA: Diagnosis not present

## 2016-06-24 DIAGNOSIS — E876 Hypokalemia: Secondary | ICD-10-CM | POA: Diagnosis not present

## 2016-06-24 DIAGNOSIS — D509 Iron deficiency anemia, unspecified: Secondary | ICD-10-CM | POA: Diagnosis not present

## 2016-06-24 DIAGNOSIS — I351 Nonrheumatic aortic (valve) insufficiency: Secondary | ICD-10-CM | POA: Diagnosis not present

## 2016-07-08 DIAGNOSIS — J690 Pneumonitis due to inhalation of food and vomit: Secondary | ICD-10-CM | POA: Diagnosis not present

## 2016-07-08 DIAGNOSIS — R1310 Dysphagia, unspecified: Secondary | ICD-10-CM | POA: Diagnosis not present

## 2016-07-08 DIAGNOSIS — I351 Nonrheumatic aortic (valve) insufficiency: Secondary | ICD-10-CM | POA: Diagnosis not present

## 2016-07-08 DIAGNOSIS — J209 Acute bronchitis, unspecified: Secondary | ICD-10-CM | POA: Diagnosis not present

## 2016-07-08 DIAGNOSIS — D509 Iron deficiency anemia, unspecified: Secondary | ICD-10-CM | POA: Diagnosis not present

## 2016-07-08 DIAGNOSIS — I10 Essential (primary) hypertension: Secondary | ICD-10-CM | POA: Diagnosis not present

## 2016-07-21 DIAGNOSIS — J690 Pneumonitis due to inhalation of food and vomit: Secondary | ICD-10-CM | POA: Diagnosis not present

## 2016-07-21 DIAGNOSIS — I351 Nonrheumatic aortic (valve) insufficiency: Secondary | ICD-10-CM | POA: Diagnosis not present

## 2016-07-21 DIAGNOSIS — D509 Iron deficiency anemia, unspecified: Secondary | ICD-10-CM | POA: Diagnosis not present

## 2016-07-21 DIAGNOSIS — R1310 Dysphagia, unspecified: Secondary | ICD-10-CM | POA: Diagnosis not present

## 2016-07-21 DIAGNOSIS — I10 Essential (primary) hypertension: Secondary | ICD-10-CM | POA: Diagnosis not present

## 2016-07-21 DIAGNOSIS — J209 Acute bronchitis, unspecified: Secondary | ICD-10-CM | POA: Diagnosis not present

## 2016-07-30 ENCOUNTER — Other Ambulatory Visit: Payer: Self-pay | Admitting: Orthopedic Surgery

## 2016-07-30 DIAGNOSIS — M5416 Radiculopathy, lumbar region: Secondary | ICD-10-CM

## 2016-07-30 DIAGNOSIS — M25552 Pain in left hip: Secondary | ICD-10-CM

## 2016-07-30 DIAGNOSIS — M48061 Spinal stenosis, lumbar region without neurogenic claudication: Secondary | ICD-10-CM

## 2016-09-02 ENCOUNTER — Other Ambulatory Visit (HOSPITAL_COMMUNITY): Payer: Self-pay | Admitting: Family Medicine

## 2016-09-02 DIAGNOSIS — Z1231 Encounter for screening mammogram for malignant neoplasm of breast: Secondary | ICD-10-CM

## 2016-09-15 ENCOUNTER — Telehealth: Payer: Self-pay | Admitting: Orthopedic Surgery

## 2016-09-15 NOTE — Telephone Encounter (Signed)
Joaquim Lai (patient's daughter)called wanting to get her mom in to be seen by Dr. Aline Brochure. She stated that he had sent her mom over to the hospital to get an injection in her back and she was needing another one. I explained that Dr. Aline Brochure was out of the office and she asked if Dr. Luna Glasgow would do it. I see where the patient had an injection in her right hip done in 06/21/15. I am thinking she may need to be seen by Dr. Aline Brochure again before having this done again.  Frances-[phone]  Please call and advise.

## 2016-09-16 NOTE — Telephone Encounter (Signed)
First available with Dr Aline Brochure

## 2016-09-19 ENCOUNTER — Ambulatory Visit (INDEPENDENT_AMBULATORY_CARE_PROVIDER_SITE_OTHER): Payer: Medicare Other | Admitting: Orthopedic Surgery

## 2016-09-19 ENCOUNTER — Ambulatory Visit (INDEPENDENT_AMBULATORY_CARE_PROVIDER_SITE_OTHER): Payer: Medicare Other

## 2016-09-19 DIAGNOSIS — G8929 Other chronic pain: Secondary | ICD-10-CM

## 2016-09-19 DIAGNOSIS — M545 Low back pain: Secondary | ICD-10-CM | POA: Diagnosis not present

## 2016-09-19 DIAGNOSIS — M47816 Spondylosis without myelopathy or radiculopathy, lumbar region: Secondary | ICD-10-CM

## 2016-09-19 NOTE — Patient Instructions (Signed)
Mri will be scheduled

## 2016-09-19 NOTE — Progress Notes (Signed)
Progress Note   Patient ID: [name] [name], female   DOB: 10/12/1922, 83 y.o.   MRN: [id number]  Chief complaint lower back pain radiating to left foot, recurrent  HPI [name] [name] is a 83 y.o. female.   HPI 83 year old female had MRI of her lower back in 2006 visually for epidural steroid injections after failing physical therapy  She presents now with weakness of the left leg burning pain radiating down the back of the leg starting in the back across the left buttock associated with weakness and inability to stand without the assistance of a walker  Review of Systems Review of Systems  Constitutional: Negative for appetite change, fatigue, fever and unexpected weight change.  Neurological: Positive for weakness and numbness.    Examination There were no vitals taken for this visit.  Gen. appearance the patient's appearance is normal with normal grooming and  hygiene The patient is oriented to person place and time Mood and affect are normal   Ortho Exam Stability tests are normal  Motor exam 5/5 manual muscle testing , no atrophy  Skin is normal (no rash or erythema)   What we find is tenderness in her lower back at L5-S1 in the left buttock. She has a positive straight leg raise at 40. 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 Patient will probably need epidural steroid injections which worked well for her in the past  Arther Abbott, MD 09/19/2016 12:08 PM

## 2016-09-23 ENCOUNTER — Other Ambulatory Visit: Payer: Self-pay | Admitting: Urology

## 2016-09-23 DIAGNOSIS — C641 Malignant neoplasm of right kidney, except renal pelvis: Secondary | ICD-10-CM

## 2016-09-26 ENCOUNTER — Ambulatory Visit (HOSPITAL_COMMUNITY)
Admission: RE | Admit: 2016-09-26 | Discharge: 2016-09-26 | Disposition: A | Discharge status: Home / Self Care | Payer: Medicare Other | Source: Ambulatory Visit | Attending: Orthopedic Surgery | Admitting: Orthopedic Surgery

## 2016-09-26 ENCOUNTER — Ambulatory Visit (HOSPITAL_COMMUNITY): Admission: RE | Admit: 2016-09-26 | Payer: Medicare Other | Source: Ambulatory Visit

## 2016-09-26 ENCOUNTER — Ambulatory Visit (HOSPITAL_COMMUNITY): Payer: Medicare Other

## 2016-09-26 DIAGNOSIS — M47816 Spondylosis without myelopathy or radiculopathy, lumbar region: Secondary | ICD-10-CM

## 2016-09-26 DIAGNOSIS — M48061 Spinal stenosis, lumbar region without neurogenic claudication: Secondary | ICD-10-CM | POA: Insufficient documentation

## 2016-09-26 DIAGNOSIS — M545 Low back pain: Secondary | ICD-10-CM | POA: Diagnosis not present

## 2016-09-26 DIAGNOSIS — M5136 Other intervertebral disc degeneration, lumbar region: Secondary | ICD-10-CM | POA: Insufficient documentation

## 2016-10-03 ENCOUNTER — Ambulatory Visit (HOSPITAL_COMMUNITY)
Admission: RE | Admit: 2016-10-03 | Discharge: 2016-10-03 | Disposition: A | Discharge status: Home / Self Care | Payer: Medicare Other | Source: Ambulatory Visit | Attending: Urology | Admitting: Urology

## 2016-10-03 DIAGNOSIS — C649 Malignant neoplasm of unspecified kidney, except renal pelvis: Secondary | ICD-10-CM | POA: Diagnosis not present

## 2016-10-03 DIAGNOSIS — C641 Malignant neoplasm of right kidney, except renal pelvis: Secondary | ICD-10-CM | POA: Insufficient documentation

## 2016-10-06 ENCOUNTER — Ambulatory Visit (HOSPITAL_COMMUNITY)
Admission: RE | Admit: 2016-10-06 | Discharge: 2016-10-06 | Disposition: A | Discharge status: Home / Self Care | Payer: Medicare Other | Source: Ambulatory Visit | Attending: Family Medicine | Admitting: Family Medicine

## 2016-10-06 DIAGNOSIS — Z1231 Encounter for screening mammogram for malignant neoplasm of breast: Secondary | ICD-10-CM | POA: Diagnosis not present

## 2016-10-20 ENCOUNTER — Ambulatory Visit (INDEPENDENT_AMBULATORY_CARE_PROVIDER_SITE_OTHER): Payer: Medicare Other | Admitting: Orthopedic Surgery

## 2016-10-20 DIAGNOSIS — M545 Low back pain: Secondary | ICD-10-CM

## 2016-10-20 DIAGNOSIS — M47816 Spondylosis without myelopathy or radiculopathy, lumbar region: Secondary | ICD-10-CM

## 2016-10-20 DIAGNOSIS — G8929 Other chronic pain: Secondary | ICD-10-CM | POA: Diagnosis not present

## 2016-10-20 NOTE — Progress Notes (Signed)
Patient ID: [name] [name], female   DOB: June 05, 1923, 83 y.o.   MRN: [id number]  MRI FOLLOW UP  Chief Complaint  Patient presents with  . Follow-up    Review MRI results of lumbar spine.    HPI [name] [name] is a 83 y.o. female.   Back pain and bilateral leg pain   MRI OF The lumbosacral spine    Review of Systems  Constitutional: Negative for chills, fever and weight loss.  Respiratory: Negative for shortness of breath.   Cardiovascular: Negative for chest pain.  Neurological: Negative for tingling.    Physical Exam    Data  No improvement since last visit  Independent image interpretation the MRI showed degenerative disc disease L1-S1  The report was read as follows  L1-L2: Mild disc bulge and facet hypertrophy. Mild bilateral L1 foraminal stenosis, stable.   L2-L3: Mild facet hypertrophy. Borderline to mild right L2 foraminal stenosis.   L3-L4: Mild mostly far lateral disc bulging. Moderate facet hypertrophy. Mild ligament flavum hypertrophy. Mild right greater than left L3 foraminal stenosis.   L4-L5: Possible anterior disc calcification since 2014. Chronic circumferential disc bulge butter previously-seen small superimposed central disc protrusion has resolved. Mild to moderate facet and ligament flavum hypertrophy. Mild bilateral lateral recess stenosis has not significantly changed (at L5 nerve root level). No spinal or foraminal stenosis.   L5-S1: Moderate to severe facet hypertrophy. Minimal disc bulge. Mild to moderate left greater than right lateral recess stenosis appears increased (S1 nerve root level). No spinal or foraminal stenosis.   IMPRESSION: 1.  No acute osseous abnormality identified. 2. Mild for age lower thoracic and lumbar spine degeneration with no spinal stenosis. Up to mild degenerative neural foraminal stenosis (L1 through L3 nerve levels), with mild lateral recess stenosis at the L5 nerve root and up to moderate lateral  recess stenosis at the S1 nerve root levels.     Electronically Signed   By: Genevie Ann M.D.   On: 09/26/2016 09:42    The plan is to treat conservatively Arther Abbott, MD 10/20/2016 10:29 AM

## 2016-10-21 IMAGING — US US RENAL
1 series · 14 of 25 positions shown · non-contrast
Comparison: Abdominal ultrasound dated 10/05/2014. MRI abdomen
dated 09/16/2013.

CLINICAL DATA: Suspected right lower pole renal neoplasm

EXAM:
RENAL / URINARY TRACT ULTRASOUND COMPLETE

[Series 1: us renal · 0.20mm/px · 14 of 53 slices shown]
[im 1/53]
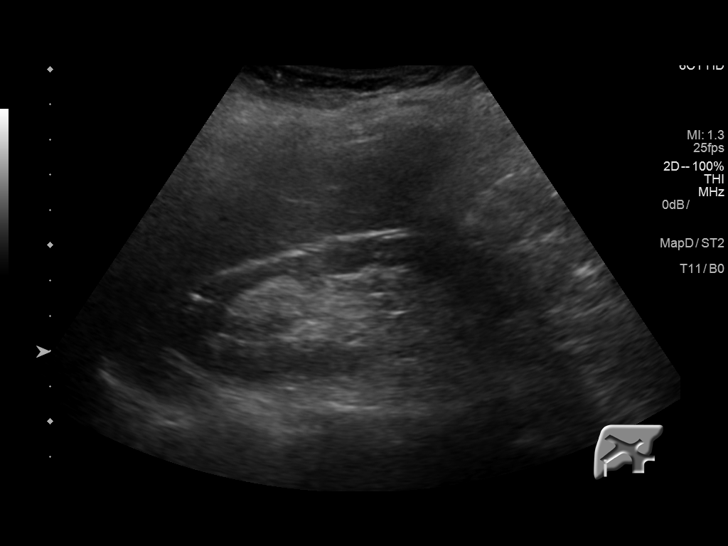
[im 5/53]
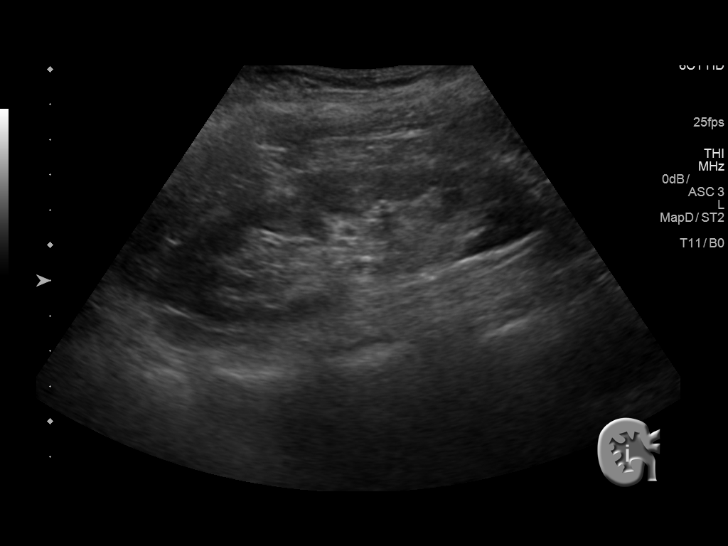
[im 9/53]
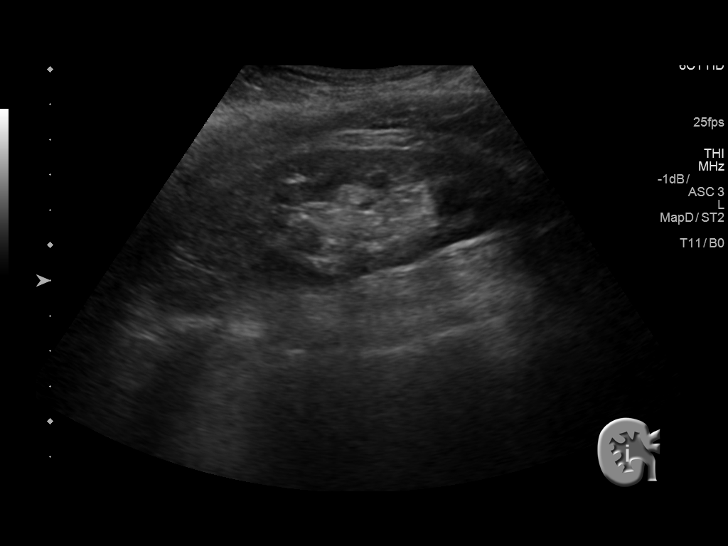
[im 14/53]
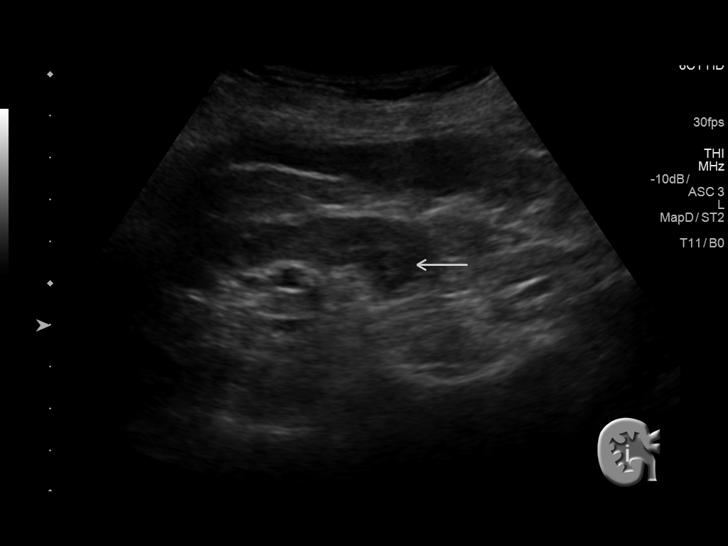
[im 18/53]
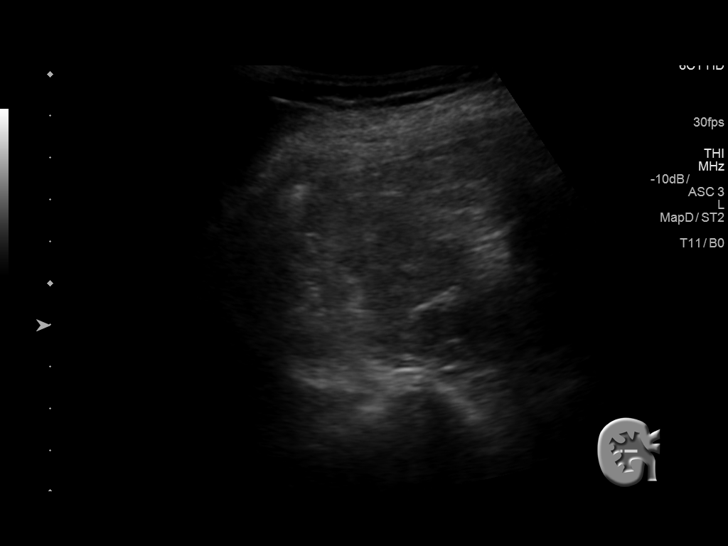
[im 20/53]
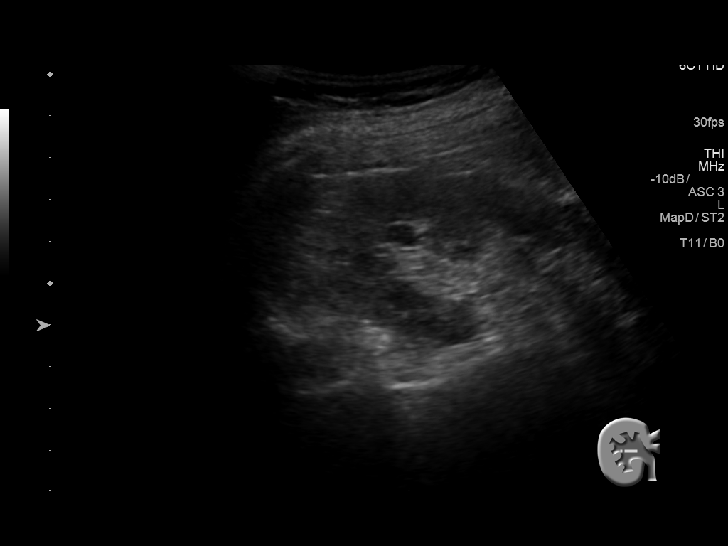
[im 24/53]
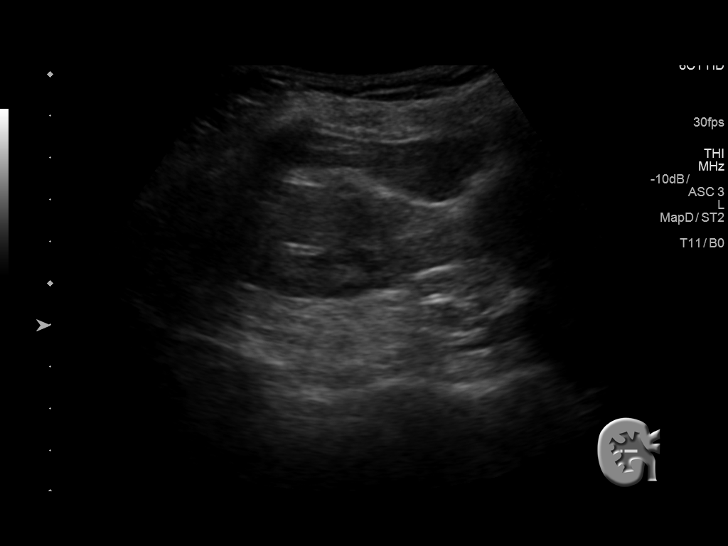
[im 29/53]
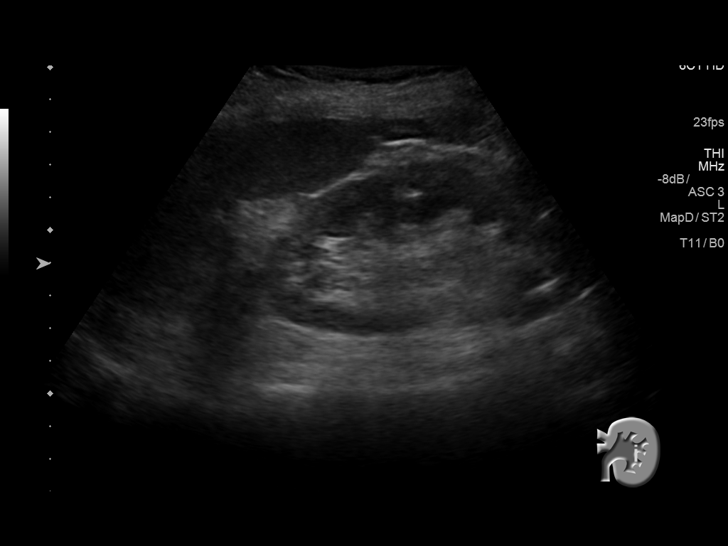
[im 33/53]
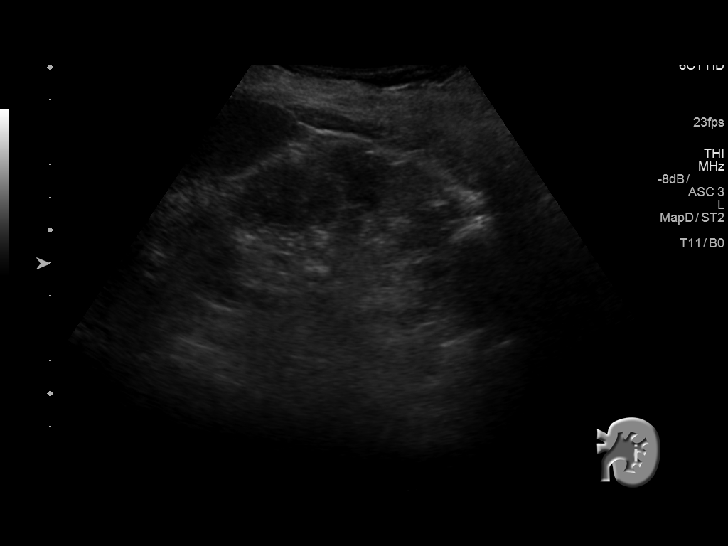
[im 35/53]
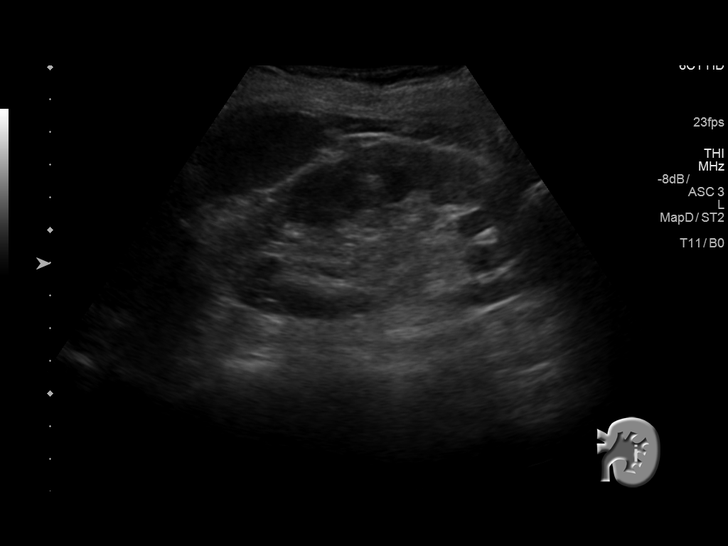
[im 40/53]
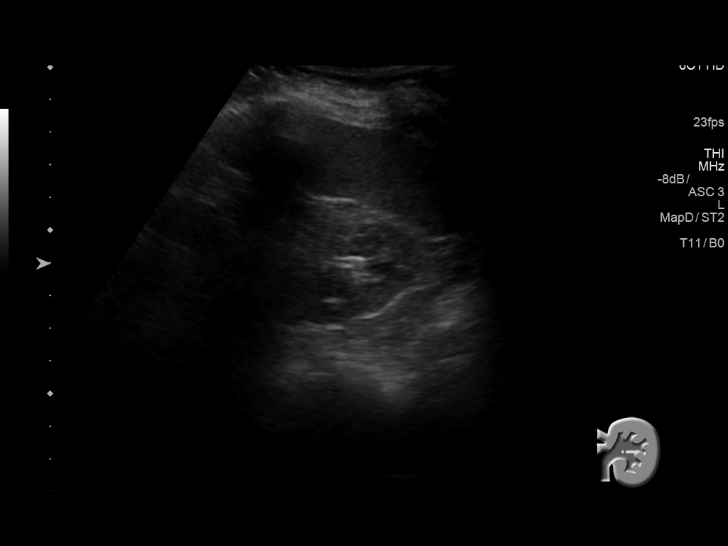
[im 44/53]
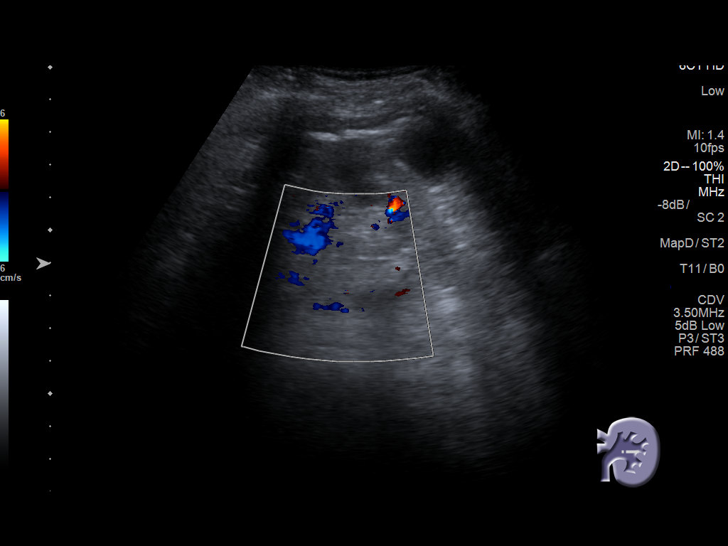
[im 48/53]
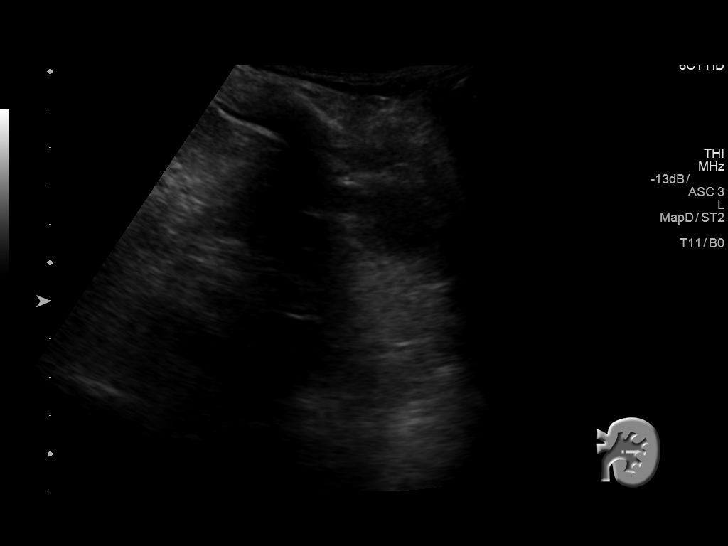
[im 53/53]
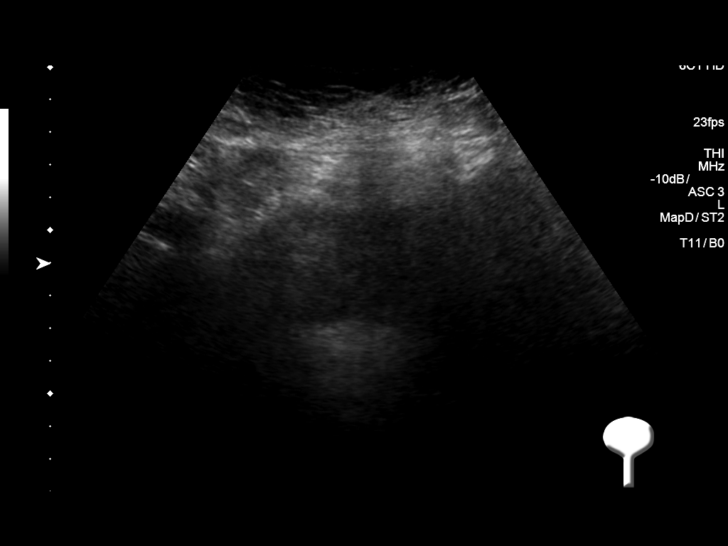

[14 of 25 positions shown; findings below may reference images not displayed]

FINDINGS: Right Kidney:

Length: 11.4 cm. 16 x 12 x 13 mm solid mass along the right lower
pole, likely corresponding to the suspected right renal neoplasm.
This previously measured 8 mm. No hydronephrosis.

Left Kidney:

Length: 11.3 cm. 3.0 x 2.5 x 2.4 cm simple interpolar cyst. No
hydronephrosis.

Bladder:

Poorly visualized/underdistended.
IMPRESSION: 1.6 cm solid right lower pole renal mass, suspicious for renal
neoplasm on prior MRI, previously 8 mm.

3.0 cm simple interpolar left renal cyst.

## 2016-10-29 ENCOUNTER — Telehealth: Payer: Self-pay | Admitting: Orthopedic Surgery

## 2016-10-29 NOTE — Telephone Encounter (Signed)
Patient's daughter and designated contact, Clarene Reamer, ph# [phone], called to relay that patient's primary care, Dr Hilma Favors, does not wish to take her off Plavix for 5 days prior to the injection for which Dr Aline Brochure has ordered.  Please advise.

## 2016-10-30 DIAGNOSIS — Z6832 Body mass index (BMI) 32.0-32.9, adult: Secondary | ICD-10-CM | POA: Diagnosis not present

## 2016-10-30 DIAGNOSIS — E6609 Other obesity due to excess calories: Secondary | ICD-10-CM | POA: Diagnosis not present

## 2016-10-30 DIAGNOSIS — R600 Localized edema: Secondary | ICD-10-CM | POA: Diagnosis not present

## 2016-10-30 DIAGNOSIS — M7989 Other specified soft tissue disorders: Secondary | ICD-10-CM | POA: Diagnosis not present

## 2016-11-11 ENCOUNTER — Ambulatory Visit
Admission: RE | Admit: 2016-11-11 | Discharge: 2016-11-11 | Disposition: A | Discharge status: Home / Self Care | Payer: Medicare Other | Source: Ambulatory Visit | Attending: Orthopedic Surgery | Admitting: Orthopedic Surgery

## 2016-11-11 DIAGNOSIS — M47816 Spondylosis without myelopathy or radiculopathy, lumbar region: Secondary | ICD-10-CM

## 2016-11-11 DIAGNOSIS — M5116 Intervertebral disc disorders with radiculopathy, lumbar region: Secondary | ICD-10-CM | POA: Diagnosis not present

## 2016-11-11 MED ORDER — METHYLPREDNISOLONE ACETATE 40 MG/ML INJ SUSP (RADIOLOG
120.0000 mg | Freq: Once | INTRAMUSCULAR | Status: AC
  Administered 2016-11-11: 120 mg via EPIDURAL

## 2016-11-11 MED ORDER — IOPAMIDOL (ISOVUE-M 200) INJECTION 41%
1.0000 mL | Freq: Once | INTRAMUSCULAR | Status: AC
  Administered 2016-11-11: 1 mL via EPIDURAL

## 2016-11-11 NOTE — Discharge Instructions (Signed)

## 2016-11-13 ENCOUNTER — Encounter (HOSPITAL_COMMUNITY): Payer: Medicare Other

## 2016-11-13 ENCOUNTER — Encounter (HOSPITAL_COMMUNITY): Payer: Self-pay | Admitting: Oncology

## 2016-11-13 ENCOUNTER — Encounter (HOSPITAL_COMMUNITY): Payer: Medicare Other | Attending: Oncology | Admitting: Oncology

## 2016-11-13 VITALS — BP 121/78 | HR 70 | Temp 98.0°F | Resp 18 | Ht 64.0 in | Wt 169.0 lb

## 2016-11-13 DIAGNOSIS — D0511 Intraductal carcinoma in situ of right breast: Secondary | ICD-10-CM

## 2016-11-13 DIAGNOSIS — D509 Iron deficiency anemia, unspecified: Secondary | ICD-10-CM

## 2016-11-13 DIAGNOSIS — C439 Malignant melanoma of skin, unspecified: Secondary | ICD-10-CM | POA: Insufficient documentation

## 2016-11-13 DIAGNOSIS — Z86000 Personal history of in-situ neoplasm of breast: Secondary | ICD-10-CM

## 2016-11-13 LAB — CBC WITH DIFFERENTIAL/PLATELET
Basophils Absolute: 0 K/uL (ref 0.0–0.1)
Basophils Relative: 0 %
Eosinophils Absolute: 0 K/uL (ref 0.0–0.7)
Eosinophils Relative: 0 %
HCT: 33.3 % — ABNORMAL LOW (ref 36.0–46.0)
Hemoglobin: 9.8 g/dL — ABNORMAL LOW (ref 12.0–15.0)
Lymphocytes Relative: 22 %
Lymphs Abs: 1.3 K/uL (ref 0.7–4.0)
MCH: 24.9 pg — ABNORMAL LOW (ref 26.0–34.0)
MCHC: 29.4 g/dL — ABNORMAL LOW (ref 30.0–36.0)
MCV: 84.5 fL (ref 78.0–100.0)
Monocytes Absolute: 0.4 K/uL (ref 0.1–1.0)
Monocytes Relative: 7 %
Neutro Abs: 4.2 K/uL (ref 1.7–7.7)
Neutrophils Relative %: 71 %
Platelets: 179 K/uL (ref 150–400)
RBC: 3.94 MIL/uL (ref 3.87–5.11)
RDW: 15.9 % — ABNORMAL HIGH (ref 11.5–15.5)
WBC: 6 K/uL (ref 4.0–10.5)

## 2016-11-13 LAB — FERRITIN: Ferritin: 7 ng/mL — ABNORMAL LOW (ref 11–307)

## 2016-11-13 NOTE — Assessment & Plan Note (Signed)
Stage I (T1A) melanoma the of left thigh, superficial spreading-type, thus far without recurrence with resection in December 2007 and re-resection 08/06/2006. She had negative margins at that time.   Labs today: CBC diff, ferritin.  I personally reviewed and went over laboratory results with the patient.  The results are noted within this dictation.  Labs demonstrate a normocytic, normochromic anemia.  Ferritin is pending.  Problem list reviewed with patient and edited accordingly.  Medications are reviewed with the patient and edited accordingly.  More than 50% of the time spent with the patient was utilized for counseling and coordination of care.

## 2016-11-13 NOTE — Assessment & Plan Note (Addendum)
Iron deficiency anemia having required IV iron therapy in the past; on Plavix  Oncology Flowsheet 12/18/2015  ferumoxytol [facility]) IV 510 mg   Labs today: CBC diff, ferritin.  I personally reviewed and went over laboratory results with the patient.  The results are noted within this dictation.  Ferritin is pending at this time.  Anemia is noted at 9.8 g/dL that is normocytic, but hypochromic.  Ferritin returned and is noted to be LOW at 7.  Calculated iron deficit is calculated to be 690.  Therefore, orders are placed for 1 dose of Injectafer.  Supportive therapy plan is built accordingly.  I will have nursing call the patient to schedule this accordingly.  Labs in 8 weeks: CBC diff, BMET, anemia panel, haptoglobin, LDH, retic count, EPO level, ESR, CRP, path smear review.  Return in 8 weeks for follow-up.

## 2016-11-13 NOTE — Assessment & Plan Note (Signed)
DCIS of the right breast in June 1998 status post surgical resection and she thinks she had radiation therapy as well.

## 2016-11-13 NOTE — Progress Notes (Signed)
[name], MD [place] [address]  Iron deficiency anemia, unspecified iron deficiency anemia type - Plan: CBC with Differential, Basic metabolic panel, Lactate dehydrogenase, Pathologist smear review, Vitamin B12, Folate, Iron and TIBC, Ferritin, Erythropoietin, Haptoglobin, Reticulocytes  Melanoma of skin ([place])  Ductal carcinoma in situ (DCIS) of right breast  CURRENT THERAPY: Observation  INTERVAL HISTORY: [name] [name] 83 y.o. female returns for followup of Stage I (T1 A.) melanoma the of left thigh, superficial spreading-type, thus far without recurrence with resection in December 2007 and re-resection 08/06/2006. She had negative margins at that time.  AND  DCIS of the right breast in June 1998 status post surgical resection and she thinks she had radiation therapy as well.  AND  Iron deficiency anemia GI blood losses with colonoscopy revealing friable anal canal hemorrhoids and diverticulosis in March 2010 by Dr. Sydell Axon.  She is doing well.  She notes an increase in her fatigue.  She does have chronic leg swelling and constipation.  She is on a bowel regimen.  She reports appetite of 100%.  Weight is stable.  She does also note chronic back and leg pain that she rates this as a 5 out of 10.  She denies any new skin lesions.  She denies any concerning skin findings.  She denies any breast abnormalities on her own breast exams.  Review of Systems  Constitutional: Positive for malaise/fatigue. Negative for chills, fever and weight loss.  HENT: Negative.   Eyes: Negative.   Respiratory: Negative.  Negative for cough.   Cardiovascular: Positive for leg swelling. Negative for chest pain.  Gastrointestinal: Positive for constipation. Negative for blood in stool, diarrhea, melena, nausea and vomiting.  Genitourinary: Negative.   Musculoskeletal: Negative.   Skin: Positive for itching and rash.  Neurological: Positive for sensory  change. Negative for weakness.  Endo/Heme/Allergies: Bruises/bleeds easily.  Psychiatric/Behavioral: Negative.     Past Medical History:  Diagnosis Date  . Aneurysm, thoracic aortic ([place])   . Aortic insufficiency   . Ascending aortic aneurysm ([place])    CT in 2012 - 4.2x4.2cm  . Bladder infection    h/o  . 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 Dehydration    HISTORY   . Fall at home 09/10/12  . Hemorrhoids   . Hip fracture ([place])    hip surgery 2001  . History of blood clots    in leg  . History of knee surgery   . Hypertension   . IDA (iron deficiency anemia) 06/07/2013  . Iron deficiency anemia, unspecified 03/14/2013   secondary to GI blood loss  . Kidney infection    h/o  . Melanoma of skin ([place]) 03/14/2013  . Melanoma of thigh ([place])    left  . Mini stroke ([place])   . S/P colonoscopy March 2010   RMR: friable anal canal hemorrhoids, hyperplastic ascending polyp, adenomatous descending polyp   . Stomach ulcer    secondary to h.pylori, s/p treatment  . Thyroid condition   . Venous stasis    edema    Past Surgical History:  Procedure Laterality Date  . APPENDECTOMY  1942  . BREAST LUMPECTOMY  1998  . CATARACT EXTRACTION, BILATERAL    . CHOLECYSTECTOMY    . COLONOSCOPY  06/05/11   pancolonic diverticulosis/ileal reosion/abnormal anorectal junction s/p biopsy: path for small intestine and Ti was benign with non-villous atrophy, rectal biopsy with prominent prolapse changes,  no acute inflammation  . CORNEAL TRANSPLANT Bilateral 2013  . ENTEROSCOPY N/A 06/13/2013   AOZ:HYQM Gastritis/GI BLEED MOST LIKELY DUE TO DUODENAL ULCERS, AND ? AVMs  . ESOPHAGOGASTRODUODENOSCOPY  06/05/11   small hiatal hernia; + H.PYLORI GASTRITIS, s/p 5 days of Pylera, unable to finish due to N/V  . ESOPHAGOGASTRODUODENOSCOPY N/A 06/13/2013   Dr. Oneida Alar- see enteroscopy  . ESOPHAGOGASTRODUODENOSCOPY (EGD) WITH ESOPHAGEAL DILATION N/A 06/08/2013   VHQ:IONGE hiatal  hernia;  otherwise normal EGD s/p dilation  . GIVENS CAPSULE STUDY N/A 06/08/2013   Procedure: GIVENS CAPSULE STUDY;  Surgeon: Daneil Dolin, MD;  Location: [facility];  Service: Endoscopy;  Laterality: N/A;  . HEMORRHOID BANDING    . KNEE SURGERY Right 05/2006   total right  . LEG SURGERY    . MELANOMA EXCISION Left 08/2006   left leg excision  . NM MYOCAR PERF WALL MOTION  03/27/2010   dipyridamole; small reversible basal to mid-septal defect (?artifact), post-stress EF 55%, low risk scan   . PARTIAL HYSTERECTOMY  1976  . TONSILLECTOMY    . TOTAL HIP ARTHROPLASTY Left 2001&2004    X 2 FOR LEFT HIP  . TRANSTHORACIC ECHOCARDIOGRAM  10/06/2012   EF 55-60%, mod eccentric hypertrophy, grade 2 diastolic dysfunction; mildly calcifed AV annulus with moderate regurg; aortic root mildly dilated; LA severely dailted; PA peak pressure 19mg  . VARICOSE VEIN SURGERY      Family History  Problem Relation Age of Onset  . Breast cancer Daughter     also hyperlipidemia  . Breast cancer Daughter     also hyperlipidemia  . Asthma Mother   . Multiple sclerosis Child   . Hyperlipidemia Child   . Colon cancer Neg Hx     Social History   Social History  . Marital status: Widowed    Spouse name: N/A  . Number of children: 5  . Years of education: 6   Occupational History  .  Retired   Social History Main Topics  . Smoking status: Former Smoker    Packs/day: 1.50    Years: 20.00    Types: Cigarettes    Quit date: 07/05/1975  . Smokeless tobacco: Never Used     Comment: Quit smoking in 1975  . Alcohol use No  . Drug use: No  . Sexual activity: No   Other Topics Concern  . None   Social History Narrative  . None     PHYSICAL EXAMINATION  ECOG PERFORMANCE STATUS: 2 - Symptomatic, <50% confined to bed  Vitals:   11/13/16 1122  BP: 121/78  Pulse: 70  Resp: 18  Temp: 98 F (36.7 C)    GENERAL:alert, no distress, well nourished, well developed, comfortable, cooperative,  smiling and accompanied by her daughter, in a wheelchair. SKIN: skin color, texture, turgor are normal, no rashes or significant lesions HEAD: Normocephalic, No masses, lesions, tenderness or abnormalities EYES: normal, EOMI EARS: External ears normal OROPHARYNX:lips, buccal mucosa, and tongue normal and mucous membranes are moist  NECK: supple, no adenopathy, thyroid normal size, non-tender, without nodularity, trachea midline LYMPH:  no palpable lymphadenopathy BREAST:not examined LUNGS: clear to auscultation  HEART: regular rate & rhythm, no murmurs, no gallops, S1 normal and S2 normal ABDOMEN:abdomen soft and normal bowel sounds BACK: Back symmetric, no curvature. EXTREMITIES:less then 2 second capillary refill, no joint deformities, effusion, or inflammation, no skin discoloration, no clubbing, no cyanosis, positive findings:  edema B/L LE edema, 1+ pitting  NEURO: alert & oriented x 3 with  fluent speech, no focal motor/sensory deficits.  LABORATORY DATA: CBC    Component Value Date/Time   WBC 6.0 11/13/2016 1002   RBC 3.94 11/13/2016 1002   HGB 9.8 (L) 11/13/2016 1002   HCT 33.3 (L) 11/13/2016 1002   HCT 34.3 08/29/2015 1057   PLT 179 11/13/2016 1002   MCV 84.5 11/13/2016 1002   MCH 24.9 (L) 11/13/2016 1002   MCHC 29.4 (L) 11/13/2016 1002   RDW 15.9 (H) 11/13/2016 1002   LYMPHSABS 1.3 11/13/2016 1002   MONOABS 0.4 11/13/2016 1002   EOSABS 0.0 11/13/2016 1002   BASOSABS 0.0 11/13/2016 1002      Chemistry      Component Value Date/Time   NA 139 05/28/2016 1136   K 3.5 05/28/2016 1136   CL 101 05/28/2016 1136   CO2 29 05/28/2016 1136   BUN 21 (H) 05/28/2016 1136   CREATININE 1.16 (H) 05/28/2016 1136   CREATININE 0.80 09/06/2013 1030      Component Value Date/Time   CALCIUM 8.4 (L) 05/28/2016 1136   ALKPHOS 50 05/03/2015 1002   AST 21 05/03/2015 1002   ALT 11 (L) 05/03/2015 1002   BILITOT 1.1 05/03/2015 1002     Lab Results  Component Value Date   IRON 24  (L) 05/20/2016   TIBC 388 05/20/2016   FERRITIN 7 (L) 11/13/2016     PENDING LABS:   RADIOGRAPHIC STUDIES:  Dg Inject Diag/thera/inc Needle/cath/plc Epi/lumb/sac W/img  Result Date: 11/11/2016 CLINICAL DATA:  Lumbar spondylosis. Left L5 and S1 radiculopathy. Displacement of the L5-S1 lumbar disc. FLUOROSCOPY TIME:  Radiation Exposure Index (as provided by the fluoroscopic device): 6.84 uGy*m2 If the device does not provide the exposure index:Fluoroscopy Time: 12 seconds Number of Acquired Images:  0 PROCEDURE: The procedure, risks, benefits, and alternatives were explained to the patient. Questions regarding the procedure were encouraged and answered. The patient understands and consents to the procedure. LUMBAR EPIDURAL INJECTION: An interlaminar approach was performed on the left at L5-S1. The overlying skin was cleansed and anesthetized. A 20 gauge epidural needle was advanced using loss-of-resistance technique. DIAGNOSTIC EPIDURAL INJECTION: Injection of Isovue-M 200 shows a good epidural pattern with spread above and below the level of needle placement, primarily on the left no vascular opacification is seen. THERAPEUTIC EPIDURAL INJECTION: 120 Mg of Depo-Medrol mixed with 3.0 mL 1% lidocaine were instilled. The procedure was well-tolerated, and the patient was discharged thirty minutes following the injection in good condition. COMPLICATIONS: None IMPRESSION: Technically successful epidural injection on the left L5-S1 # 1 Electronically Signed   By: San Morelle M.D.   On: 11/11/2016 12:21     PATHOLOGY:    ASSESSMENT AND PLAN:  Melanoma of skin ([place]) Stage I (T1A) melanoma the of left thigh, superficial spreading-type, thus far without recurrence with resection in December 2007 and re-resection 08/06/2006. She had negative margins at that time.   Labs today: CBC diff, ferritin.  I personally reviewed and went over laboratory results with the patient.  The results are noted  within this dictation.  Labs demonstrate a normocytic, normochromic anemia.  Ferritin is pending.  Problem list reviewed with patient and edited accordingly.  Medications are reviewed with the patient and edited accordingly.  More than 50% of the time spent with the patient was utilized for counseling and coordination of care.   DCIS (ductal carcinoma in situ) of breast DCIS of the right breast in June 1998 status post surgical resection and she thinks she had radiation therapy as well.  IDA (iron deficiency anemia) Iron deficiency anemia having required IV iron therapy in the past; on Plavix  Oncology Flowsheet 12/18/2015  ferumoxytol [facility]) IV 510 mg   Labs today: CBC diff, ferritin.  I personally reviewed and went over laboratory results with the patient.  The results are noted within this dictation.  Ferritin is pending at this time.  Anemia is noted at 9.8 g/dL that is normocytic, but hypochromic.  Ferritin returned and is noted to be LOW at 7.  Calculated iron deficit is calculated to be 690.  Therefore, orders are placed for 1 dose of Injectafer.  Supportive therapy plan is built accordingly.  I will have nursing call the patient to schedule this accordingly.  Labs in 8 weeks: CBC diff, BMET, anemia panel, haptoglobin, LDH, retic count, EPO level, ESR, CRP, path smear review.  Return in 8 weeks for follow-up.   ORDERS PLACED FOR THIS ENCOUNTER: Orders Placed This Encounter  Procedures  . CBC with Differential  . Basic metabolic panel  . Lactate dehydrogenase  . Pathologist smear review  . Vitamin B12  . Folate  . Iron and TIBC  . Ferritin  . Erythropoietin  . Haptoglobin  . Reticulocytes    MEDICATIONS PRESCRIBED THIS ENCOUNTER: No orders of the defined types were placed in this encounter.   THERAPY PLAN:  Continue to monitor for recurrence of disease.  Will continue to monitor for iron deficiency and replace accordingly.  All questions were answered. The  patient knows to call the clinic with any problems, questions or concerns. We can certainly see the patient much sooner if necessary.  Patient and plan discussed with Dr. Twana First and she is in agreement with the aforementioned.   This note is electronically signed by: Doy Mince 11/13/2016 3:02 PM

## 2016-11-13 NOTE — Patient Instructions (Signed)
[place] at [facility] Discharge Instructions  RECOMMENDATIONS MADE BY THE CONSULTANT AND ANY TEST RESULTS WILL BE SENT TO YOUR REFERRING PHYSICIAN.  You were seen today by Kirby Crigler PA-C. Labs today, we will call you with results. Return in 2 months for labs and follow up  Thank you for choosing [place] at [facility] to provide your oncology and hematology care.  To afford each patient quality time with our provider, please arrive at least 15 minutes before your scheduled appointment time.    If you have a lab appointment with the [facility] please come in thru the  Main Entrance and check in at the main information desk  You need to re-schedule your appointment should you arrive 10 or more minutes late.  We strive to give you quality time with our providers, and arriving late affects you and other patients whose appointments are after yours.  Also, if you no show three or more times for appointments you may be dismissed from the clinic at the providers discretion.     Again, thank you for choosing [facility].  Our hope is that these requests will decrease the amount of time that you wait before being seen by our physicians.       _____________________________________________________________  Should you have questions after your visit to [facility], please contact our office at [phone] between the hours of 8:30 a.m. and 4:30 p.m.  Voicemails left after 4:30 p.m. will not be returned until the following business day.  For prescription refill requests, have your pharmacy contact our office.       Resources For Cancer Patients and their Caregivers ? American Cancer Society: Can assist with transportation, wigs, general needs, runs Look Good Feel Better.        9790234137 ? Cancer Care: Provides financial assistance, online support groups, medication/co-pay assistance.  1-800-813-HOPE  (213)193-5023) ? [place] Assists [place] cancer patients and their families through emotional , educational and financial support.  213 391 8369 ? [place] DSS Where to apply for food stamps, Medicaid and utility assistance. 432-778-3760 ? [employer]: Transportation to medical appointments. 740-692-3803 ? Social Security Administration: May apply for disability if have a Stage IV cancer. 732-185-2685 682-612-6527 ? [employer], Disability and Transit Services: Assists with nutrition, care and transit needs. [place] Support Programs: @10RELATIVEDAYS @ > Cancer Support Group  2nd Tuesday of the month 1pm-2pm, Journey Room  > Creative Journey  3rd Tuesday of the month 1130am-1pm, Journey Room  > Look Good Feel Better  1st Wednesday of the month 10am-12 noon, Journey Room (Call [place] to register [phone])

## 2016-11-14 ENCOUNTER — Ambulatory Visit (INDEPENDENT_AMBULATORY_CARE_PROVIDER_SITE_OTHER): Payer: Medicare Other | Admitting: Urology

## 2016-11-14 DIAGNOSIS — N281 Cyst of kidney, acquired: Secondary | ICD-10-CM

## 2016-11-14 DIAGNOSIS — C641 Malignant neoplasm of right kidney, except renal pelvis: Secondary | ICD-10-CM

## 2016-11-25 ENCOUNTER — Encounter (HOSPITAL_BASED_OUTPATIENT_CLINIC_OR_DEPARTMENT_OTHER): Payer: Medicare Other

## 2016-11-25 VITALS — BP 134/56 | HR 56 | Temp 97.6°F | Resp 18

## 2016-11-25 DIAGNOSIS — D509 Iron deficiency anemia, unspecified: Secondary | ICD-10-CM | POA: Diagnosis present

## 2016-11-25 MED ORDER — SODIUM CHLORIDE 0.9 % IV SOLN
750.0000 mg | Freq: Once | INTRAVENOUS | Status: AC
  Administered 2016-11-25: 750 mg via INTRAVENOUS
  Filled 2016-11-25: qty 15

## 2016-11-25 NOTE — Patient Instructions (Signed)
[place] at [facility] Discharge Instructions  RECOMMENDATIONS MADE BY THE CONSULTANT AND ANY TEST RESULTS WILL BE SENT TO YOUR REFERRING PHYSICIAN.  Injectafer iron infusion given today as ordered. Return as scheduled.  Thank you for choosing [place] at [facility] to provide your oncology and hematology care.  To afford each patient quality time with our provider, please arrive at least 15 minutes before your scheduled appointment time.    If you have a lab appointment with the [facility] please come in thru the  Main Entrance and check in at the main information desk  You need to re-schedule your appointment should you arrive 10 or more minutes late.  We strive to give you quality time with our providers, and arriving late affects you and other patients whose appointments are after yours.  Also, if you no show three or more times for appointments you may be dismissed from the clinic at the providers discretion.     Again, thank you for choosing [facility].  Our hope is that these requests will decrease the amount of time that you wait before being seen by our physicians.       _____________________________________________________________  Should you have questions after your visit to [facility], please contact our office at [phone] between the hours of 8:30 a.m. and 4:30 p.m.  Voicemails left after 4:30 p.m. will not be returned until the following business day.  For prescription refill requests, have your pharmacy contact our office.       Resources For Cancer Patients and their Caregivers ? American Cancer Society: Can assist with transportation, wigs, general needs, runs Look Good Feel Better.        (613)419-4346 ? Cancer Care: Provides financial assistance, online support groups, medication/co-pay assistance.  1-800-813-HOPE 772 152 1069) ? [place] Assists [place] cancer patients and their families through emotional , educational and financial support.  412-374-1730 ? [place] DSS Where to apply for food stamps, Medicaid and utility assistance. (787)457-5381 ? RCATS: Transportation to medical appointments. 803-793-0901 ? Social Security Administration: May apply for disability if have a Stage IV cancer. 831-858-3233 509-791-3051 ? [employer], Disability and Transit Services: Assists with nutrition, care and transit needs. [place] Support Programs: @10RELATIVEDAYS @ > Cancer Support Group  2nd Tuesday of the month 1pm-2pm, Journey Room  > Creative Journey  3rd Tuesday of the month 1130am-1pm, Journey Room  > Look Good Feel Better  1st Wednesday of the month 10am-12 noon, Journey Room (Call [place] to register [phone])

## 2016-11-25 NOTE — Progress Notes (Signed)
Tolerated iron infusion well. Stable on discharge home with family via wheelchair.

## 2016-12-12 DIAGNOSIS — H6123 Impacted cerumen, bilateral: Secondary | ICD-10-CM | POA: Diagnosis not present

## 2016-12-12 DIAGNOSIS — Z1389 Encounter for screening for other disorder: Secondary | ICD-10-CM | POA: Diagnosis not present

## 2016-12-12 DIAGNOSIS — Z6833 Body mass index (BMI) 33.0-33.9, adult: Secondary | ICD-10-CM | POA: Diagnosis not present

## 2016-12-30 ENCOUNTER — Other Ambulatory Visit: Payer: Self-pay

## 2016-12-30 ENCOUNTER — Encounter: Payer: Self-pay | Admitting: Internal Medicine

## 2016-12-30 ENCOUNTER — Telehealth: Payer: Self-pay

## 2016-12-30 ENCOUNTER — Ambulatory Visit (INDEPENDENT_AMBULATORY_CARE_PROVIDER_SITE_OTHER): Payer: Medicare Other | Admitting: Internal Medicine

## 2016-12-30 VITALS — BP 126/59 | HR 67 | Temp 97.3°F | Ht 64.0 in | Wt 170.2 lb

## 2016-12-30 DIAGNOSIS — R195 Other fecal abnormalities: Secondary | ICD-10-CM

## 2016-12-30 DIAGNOSIS — D508 Other iron deficiency anemias: Secondary | ICD-10-CM | POA: Diagnosis not present

## 2016-12-30 DIAGNOSIS — K921 Melena: Secondary | ICD-10-CM

## 2016-12-30 DIAGNOSIS — D509 Iron deficiency anemia, unspecified: Secondary | ICD-10-CM

## 2016-12-30 MED ORDER — PEG 3350-KCL-NA BICARB-NACL 420 G PO SOLR: 4000.0000 mL | ORAL | 0 refills | Status: DC

## 2016-12-30 NOTE — Progress Notes (Signed)
Primary Care Physician:  Sharilyn Sites, MD Primary Gastroenterologist:  Dr. Gala Romney  Pre-Procedure History & Physical: HPI:  [name] [name] is a 83 y.o. female here for chronic constipation and rectal bleeding. Intermittent rectal bleeding and clear malodorous discharge from time to time. History of rectal prolapse changes on prior colonoscopy. Significant iron deficiency anemia;  sees a little blood per rectum on occasion. Followed by hematology. Has received IV iron as recently as earlier in the year.  She was complaining of some early satiety and postprandial coughing at her last visit. Barium pill esophagram / upper GI series revealed dysmotility only. Swallowing study  was obtained which she said came back within normal limits.  Last colonoscopy 2012 without significant findings.  Past medical history significant for breast cancer and melanoma. She remains on Plavix.  Past Medical History:  Diagnosis Date  . Aneurysm, thoracic aortic ([place])   . Aortic insufficiency   . Ascending aortic aneurysm ([place])    CT in 2012 - 4.2x4.2cm  . Bladder infection    h/o  . 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 Dehydration    HISTORY   . Fall at home 09/10/12  . Hemorrhoids   . Hip fracture ([place])    hip surgery 2001  . History of blood clots    in leg  . History of knee surgery   . Hypertension   . IDA (iron deficiency anemia) 06/07/2013  . Iron deficiency anemia, unspecified 03/14/2013   secondary to GI blood loss  . Kidney infection    h/o  . Melanoma of skin ([place]) 03/14/2013  . Melanoma of thigh ([place])    left  . Mini stroke ([place])   . S/P colonoscopy March 2010   RMR: friable anal canal hemorrhoids, hyperplastic ascending polyp, adenomatous descending polyp   . Stomach ulcer    secondary to h.pylori, s/p treatment  . Thyroid condition   . Venous stasis    edema    Past Surgical History:  Procedure Laterality Date  . APPENDECTOMY  1942  . BREAST  LUMPECTOMY  1998  . CATARACT EXTRACTION, BILATERAL    . CHOLECYSTECTOMY    . COLONOSCOPY  06/05/11   pancolonic diverticulosis/ileal reosion/abnormal anorectal junction s/p biopsy: path for small intestine and Ti was benign with non-villous atrophy, rectal biopsy with prominent prolapse changes, no acute inflammation  . CORNEAL TRANSPLANT Bilateral 2013  . ENTEROSCOPY N/A 06/13/2013   QMG:QQPY Gastritis/GI BLEED MOST LIKELY DUE TO DUODENAL ULCERS, AND ? AVMs  . ESOPHAGOGASTRODUODENOSCOPY  06/05/11   small hiatal hernia; + H.PYLORI GASTRITIS, s/p 5 days of Pylera, unable to finish due to N/V  . ESOPHAGOGASTRODUODENOSCOPY N/A 06/13/2013   Dr. Oneida Alar- see enteroscopy  . ESOPHAGOGASTRODUODENOSCOPY (EGD) WITH ESOPHAGEAL DILATION N/A 06/08/2013   PPJ:[id number] hiatal hernia;  otherwise normal EGD s/p dilation  . GIVENS CAPSULE STUDY N/A 06/08/2013   Procedure: GIVENS CAPSULE STUDY;  Surgeon: Daneil Dolin, MD;  [facility];  Service: Endoscopy;  Laterality: N/A;  . HEMORRHOID BANDING    . KNEE SURGERY Right 05/2006   total right  . LEG SURGERY    . MELANOMA EXCISION Left 08/2006   left leg excision  . NM MYOCAR PERF WALL MOTION  03/27/2010   dipyridamole; small reversible basal to mid-septal defect (?artifact), post-stress EF 55%, low risk scan   . PARTIAL HYSTERECTOMY  1976  . TONSILLECTOMY    . TOTAL HIP ARTHROPLASTY Left 2001&2004  X 2 FOR LEFT HIP  . TRANSTHORACIC ECHOCARDIOGRAM  10/06/2012   EF 55-60%, mod eccentric hypertrophy, grade 2 diastolic dysfunction; mildly calcifed AV annulus with moderate regurg; aortic root mildly dilated; LA severely dailted; PA peak pressure 36mHg  . VARICOSE VEIN SURGERY      Prior to Admission medications   Medication Sig Start Date End Date Taking? Authorizing Provider  albuterol (PROVENTIL HFA;VENTOLIN HFA) 108 (90 BASE) MCG/ACT inhaler Inhale 1-2 puffs into the lungs every 6 (six) hours as needed for wheezing. 04/21/15  Yes Tanna Furry, MD    clopidogrel (PLAVIX) 75 MG tablet Take 75 mg by mouth daily with breakfast.   Yes [provider]  furosemide (LASIX) 20 MG tablet Take 1 tablet (20 mg total) by mouth daily. 03/13/14  Yes Nat Christen, MD  gabapentin (NEURONTIN) 100 MG capsule TAKE 2 CAPSULES BY MOUTH 3  TIMES DAILY 08/05/16  Yes Carole Civil, MD  hydrocortisone (ANUSOL-HC) 25 MG suppository Place 1 suppository (25 mg total) rectally every 12 (twelve) hours. Patient taking differently: Place 25 mg rectally 2 (two) times daily as needed for hemorrhoids or itching.  05/16/15  Yes Annitta Needs, NP  ipratropium-albuterol (DUONEB) 0.5-2.5 (3) MG/3ML SOLN Take 3 mLs by nebulization 3 (three) times daily. 05/21/16  Yes Kathie Dike, MD  mometasone-formoterol (DULERA) 100-5 MCG/ACT AERO Inhale 2 puffs into the lungs 2 (two) times daily. 05/21/16  Yes Kathie Dike, MD  ondansetron (ZOFRAN) 4 MG tablet Take 1 tablet (4 mg total) by mouth every 8 (eight) hours as needed for nausea or vomiting. Patient taking differently: Take 4 mg by mouth every morning. *MAY TAKE AS NEEDED DAILY BUT TAKES ONE TABLET IN THE MORNING 09/13/13  Yes Mahala Menghini, PA-C  pantoprazole (PROTONIX) 40 MG tablet Take 1 tablet (40 mg total) by mouth 2 (two) times daily. 02/09/15  Yes Annitta Needs, NP  potassium chloride SA (K-DUR,KLOR-CON) 20 MEQ tablet Take 1 tablet (20 mEq total) by mouth daily. 05/22/16  Yes Kathie Dike, MD  prednisoLONE acetate (PRED FORTE) 1 % ophthalmic suspension Place 1 drop into both eyes 4 (four) times daily.    Yes [provider]  psyllium (METAMUCIL) 58.6 % packet Take 1 packet by mouth daily as needed (for constipation).    Yes [provider]  sodium chloride (MURO 128) 2 % ophthalmic solution Place 1 drop into both eyes 3 (three) times daily.    Yes [provider]  traMADol-acetaminophen (ULTRACET) 37.5-325 MG tablet Take 1 tablet by mouth every 6 (six) hours as needed for moderate pain  or severe pain.  05/10/16  Yes [provider]    Allergies as of 12/30/2016 - Review Complete 12/30/2016  Allergen Reaction Noted  . Codeine Diarrhea and Nausea Only   . Asa [aspirin] Rash and Other (See Comments) 07/27/2013  . Iron Nausea And Vomiting 02/01/2011  . Penicillins Rash     Family History  Problem Relation Age of Onset  . Breast cancer Daughter        also hyperlipidemia  . Breast cancer Daughter        also hyperlipidemia  . Asthma Mother   . Multiple sclerosis Child   . Hyperlipidemia Child   . Colon cancer Neg Hx     Social History   Social History  . Marital status: Widowed    Spouse name: N/A  . Number of children: 5  . Years of education: 6   Occupational History  .  Retired  Social History Main Topics  . Smoking status: Former Smoker    Packs/day: 1.50    Years: 20.00    Types: Cigarettes    Quit date: 07/05/1975  . Smokeless tobacco: Never Used     Comment: Quit smoking in 1975  . Alcohol use No  . Drug use: No  . Sexual activity: No   Other Topics Concern  . Not on file   Social History Narrative  . No narrative on file    Review of Systems: See HPI, otherwise negative ROS  Physical Exam: BP (!) 126/59   Pulse 67   Temp 97.3 F (36.3 C) (Oral)   Ht 5\' 4"  (1.626 m)   Wt 170 lb 3.2 oz (77.2 kg)   BMI 29.21 kg/m  General:   Alert,  Well-groomed pleasant and cooperative in NAD. Somewhat hard of hearing. Come in by her daughter. Neck:  Supple; no masses or thyromegaly. No significant cervical adenopathy. Lungs:  Clear throughout to auscultation.   No wheezes, crackles, or rhonchi. No acute distress. Heart:  Regular rate and rhythm; no murmurs, clicks, rubs,  or gallops. Abdomen: Non-distended, normal bowel sounds.  Soft and nontender without appreciable mass or hepatosplenomegaly.  Pulses:  Normal pulses noted. Extremities:  Without clubbing or edema. Very pleasant 90 Rectal: No external lesions.  She has good sphincter  tone. No mass to rectal vault mucus is strongly Hemoccult positive.   Impression /Recommendationa:  Pleasant 83 year old lady whose main GI complaint now is a little clear malodorous rectal discharge from time to time. Bowels are moving better. She does see a little blood per rectum. Constipation not felt to be a major issue these days.  She has significant iron deficiency anemia. She is Hemoccult positive today. Last colonoscopy in good 6 years ago.    To really sort out what is going on here the patient go ahead and have her lower GI tract evaluated. I think we can get her through one more colonoscopy just fine. Prep at home will be the major issue. I feel if we can bring her up to the endoscopy unit one morning we can get her prep with the help of the nursing staff into her in early afternoon. I discussed this approach along with risks benefits and limitations imponderables with the patient patient's daughter. Questions answered all parties agreeable.  She will likely do well with conscious sedation.  Given her prior evaluation, a an element of rectal prolapse here may be contributing to her leakage complaints and bleeding. She remains on Plavix. No plans to change his regimen for the procedure.  GERD symptoms well controlled on Protonix.  Further recommendations to follow.     Notice: This dictation was prepared with Dragon dictation along with smaller phrase technology. Any transcriptional errors that result from this process are unintentional and may not be corrected upon review.

## 2016-12-30 NOTE — Patient Instructions (Signed)
Schedule a diagnostic colonoscopy  - IDA/hemocult positive stool  Conscious sedation - needs 1300 or 1400 appointment- come to the endoscopy unit 2 receive prep with assistance that morning - 3 liters golytely  -   8 ozz every 15 minutes  Begin clear liquid diet day before procedure.

## 2016-12-30 NOTE — Telephone Encounter (Signed)
Called and informed Endo scheduler that pt will arrive at 7:00am on 02/06/17 for TCS with RMR that is scheduled at 1:00pm. RMR advised for her to drink prep at the hospital.

## 2016-12-30 NOTE — Telephone Encounter (Signed)
Kim at [employer] and said that Threasa Beards wanted to make sure we knew that someone will need to be with pt while she is drinking her prep. Called pt's daughter Joaquim Lai, who was with pt at [facility] this morning) and LMOVM to make sure she knew someone would have to stay with her while she drinks her prep.

## 2017-01-13 ENCOUNTER — Encounter (HOSPITAL_COMMUNITY): Payer: Medicare Other | Attending: Adult Health | Admitting: Adult Health

## 2017-01-13 ENCOUNTER — Encounter (HOSPITAL_COMMUNITY): Payer: Self-pay | Admitting: Adult Health

## 2017-01-13 ENCOUNTER — Other Ambulatory Visit (HOSPITAL_COMMUNITY): Payer: Self-pay | Admitting: Oncology

## 2017-01-13 ENCOUNTER — Encounter (HOSPITAL_COMMUNITY): Payer: Medicare Other | Attending: Oncology

## 2017-01-13 VITALS — BP 131/81 | HR 74 | Temp 98.0°F | Resp 18 | Wt 170.4 lb

## 2017-01-13 DIAGNOSIS — D509 Iron deficiency anemia, unspecified: Secondary | ICD-10-CM

## 2017-01-13 DIAGNOSIS — D0511 Intraductal carcinoma in situ of right breast: Secondary | ICD-10-CM

## 2017-01-13 DIAGNOSIS — E876 Hypokalemia: Secondary | ICD-10-CM

## 2017-01-13 DIAGNOSIS — Z86000 Personal history of in-situ neoplasm of breast: Secondary | ICD-10-CM | POA: Diagnosis not present

## 2017-01-13 DIAGNOSIS — C439 Malignant melanoma of skin, unspecified: Secondary | ICD-10-CM

## 2017-01-13 DIAGNOSIS — R110 Nausea: Secondary | ICD-10-CM

## 2017-01-13 LAB — VITAMIN B12: Vitamin B-12: 198 pg/mL (ref 180–914)

## 2017-01-13 LAB — CBC WITH DIFFERENTIAL/PLATELET
BASOS ABS: 0 10*3/uL (ref 0.0–0.1)
Basophils Relative: 1 %
EOS ABS: 0.1 10*3/uL (ref 0.0–0.7)
EOS PCT: 3 %
HCT: 39.7 % (ref 36.0–46.0)
Hemoglobin: 12.3 g/dL (ref 12.0–15.0)
LYMPHS ABS: 1.4 10*3/uL (ref 0.7–4.0)
Lymphocytes Relative: 32 %
MCH: 27.7 pg (ref 26.0–34.0)
MCHC: 31 g/dL (ref 30.0–36.0)
MCV: 89.4 fL (ref 78.0–100.0)
Monocytes Absolute: 0.2 10*3/uL (ref 0.1–1.0)
Monocytes Relative: 6 %
Neutro Abs: 2.5 10*3/uL (ref 1.7–7.7)
Neutrophils Relative %: 58 %
Platelets: 177 10*3/uL (ref 150–400)
RBC: 4.44 MIL/uL (ref 3.87–5.11)
RDW: 19.4 % — ABNORMAL HIGH (ref 11.5–15.5)
WBC: 4.3 10*3/uL (ref 4.0–10.5)

## 2017-01-13 LAB — IRON AND TIBC
IRON: 73 ug/dL (ref 28–170)
Saturation Ratios: 20 % (ref 10.4–31.8)
TIBC: 364 ug/dL (ref 250–450)
UIBC: 291 ug/dL

## 2017-01-13 LAB — FERRITIN: FERRITIN: 69 ng/mL (ref 11–307)

## 2017-01-13 LAB — BASIC METABOLIC PANEL
Anion gap: 9 (ref 5–15)
BUN: 15 mg/dL (ref 6–20)
CALCIUM: 8.4 mg/dL — AB (ref 8.9–10.3)
CO2: 30 mmol/L (ref 22–32)
Chloride: 101 mmol/L (ref 101–111)
Creatinine, Ser: 1.01 mg/dL — ABNORMAL HIGH (ref 0.44–1.00)
GFR calc Af Amer: 54 mL/min — ABNORMAL LOW (ref >60)
GFR, EST NON AFRICAN AMERICAN: 47 mL/min — AB (ref >60)
Glucose, Bld: 121 mg/dL — ABNORMAL HIGH (ref 65–99)
POTASSIUM: 3.1 mmol/L — AB (ref 3.5–5.1)
Sodium: 140 mmol/L (ref 135–145)

## 2017-01-13 LAB — RETICULOCYTES
RBC.: 4.44 MIL/uL (ref 3.87–5.11)
RETIC COUNT ABSOLUTE: 62.2 10*3/uL (ref 19.0–186.0)
RETIC CT PCT: 1.4 % (ref 0.4–3.1)

## 2017-01-13 LAB — LACTATE DEHYDROGENASE: LDH: 178 U/L (ref 98–192)

## 2017-01-13 LAB — FOLATE: Folate: 21.8 ng/mL (ref >5.9)

## 2017-01-13 MED ORDER — PROCHLORPERAZINE MALEATE 10 MG PO TABS: 10.0000 mg | ORAL_TABLET | Freq: Four times a day (QID) | ORAL | 3 refills | Status: DC | PRN

## 2017-01-13 MED ORDER — POTASSIUM CHLORIDE CRYS ER 20 MEQ PO TBCR: 40.0000 meq | EXTENDED_RELEASE_TABLET | Freq: Every day | ORAL | 0 refills | Status: DC

## 2017-01-13 NOTE — Progress Notes (Signed)
[name] [name], [name] [id number]   CLINIC:  Medical Oncology/Hematology  PCP:  [name] Frizzle, MD [address] [place] [address] [phone]   REASON FOR VISIT:  Follow-up for Stage I melanoma of (L) thigh AND right breast DCIS AND iron deficiency anemia   CURRENT THERAPY: Observation AND Observation AND IV iron prn     HISTORY OF PRESENT ILLNESS:  (From [name] Crigler, PA-C's last note on 11/13/16)      INTERVAL HISTORY:  [name] [name] 83 y.o. female returns for follow-up for melanoma, right breast DCIS, and iron deficiency anemia.   Overall, she tells me that she feels "pretty good."  Appetite 75%. Chart reviewed and weight is stable.   Endorses fatigue; energy levels 50%. Endorses occasional nausea; she generally takes Zofran but she is waiting on refill from her pharmacy. She won't be able to pick up that prescription for about 2 weeks.  Denies vomiting. Bowels are moving regularly. She feels dizzy occasionally; admittedly "I probably don't drink enough water." Reports lower extremity weakness, which has been chronic. She walks with a walker at home. Denies falls.   Denies any skin changes or concerns for recurrent melanoma to the thigh or elsewhere.  Denies any changes in her breasts. Last mammogram 10/2016 done and negative.    Her last IV iron ([name] [name]) was given on 11/25/16; she tolerated well.  She is scheduled for colonoscopy with Dr. Gala [name] on 02/06/17.   She goes out to lunch every day with her daughter.  She has "Meals on Wheels" delivery for dinner each night.      REVIEW OF SYSTEMS:  Review of Systems  Constitutional: Positive for fatigue.  HENT:  Negative.   Eyes: Negative.   Respiratory: Negative.   Cardiovascular: Positive for leg swelling (chronic).  Gastrointestinal: Positive for nausea. Negative for vomiting.  Endocrine: Negative.   Genitourinary: Negative.  Negative for dysuria and hematuria.   Skin:  Negative.  Negative for rash.  Neurological: Positive for dizziness and extremity weakness. Negative for headaches.  Hematological: Negative.   Psychiatric/Behavioral: Negative.      PAST MEDICAL/SURGICAL HISTORY:  Past Medical History:  Diagnosis Date  . Aneurysm, thoracic aortic ([place])   . Aortic insufficiency   . Ascending aortic aneurysm ([place])    CT in 2012 - 4.2x4.2cm  . Bladder infection    h/o  . 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 Dehydration    HISTORY   . Fall at home 09/10/12  . Hemorrhoids   . Hip fracture ([place])    hip surgery 2001  . History of blood clots    in leg  . History of knee surgery   . Hypertension   . IDA (iron deficiency anemia) 06/07/2013  . Iron deficiency anemia, unspecified 03/14/2013   secondary to GI blood loss  . Kidney infection    h/o  . Melanoma of skin ([place]) 03/14/2013  . Melanoma of thigh ([place])    left  . Mini stroke ([place])   . S/P colonoscopy March 2010   RMR: friable anal canal hemorrhoids, hyperplastic ascending polyp, adenomatous descending polyp   . Stomach ulcer    secondary to h.pylori, s/p treatment  . Thyroid condition   . Venous stasis    edema   Past Surgical History:  Procedure Laterality Date  . APPENDECTOMY  1942  . BREAST LUMPECTOMY  1998  . CATARACT EXTRACTION, BILATERAL    .  CHOLECYSTECTOMY    . COLONOSCOPY  06/05/11   pancolonic diverticulosis/ileal reosion/abnormal anorectal junction s/p biopsy: path for small intestine and Ti was benign with non-villous atrophy, rectal biopsy with prominent prolapse changes, no acute inflammation  . CORNEAL TRANSPLANT Bilateral 2013  . ENTEROSCOPY N/A 06/13/2013   VFI:EPPI Gastritis/GI BLEED MOST LIKELY DUE TO DUODENAL ULCERS, AND ? AVMs  . ESOPHAGOGASTRODUODENOSCOPY  06/05/11   small hiatal hernia; + H.PYLORI GASTRITIS, s/p 5 days of Pylera, unable to finish due to N/V  . ESOPHAGOGASTRODUODENOSCOPY N/A 06/13/2013   [name] [name]- see enteroscopy   . ESOPHAGOGASTRODUODENOSCOPY (EGD) WITH ESOPHAGEAL DILATION N/A 06/08/2013   RJJ:[id number] hiatal hernia;  otherwise normal EGD s/p dilation  . GIVENS CAPSULE STUDY N/A 06/08/2013   Procedure: GIVENS CAPSULE STUDY;  Surgeon: [name] Dolin, MD;  Location[facility];  Service: Endoscopy;  Laterality: N/A;  . HEMORRHOID BANDING    . KNEE SURGERY Right 05/2006   total right  . LEG SURGERY    . MELANOMA EXCISION Left 08/2006   left leg excision  . NM MYOCAR PERF WALL MOTION  03/27/2010   dipyridamole; small reversible basal to mid-septal defect (?artifact), post-stress EF 55%, low risk scan   . PARTIAL HYSTERECTOMY  1976  . TONSILLECTOMY    . TOTAL HIP ARTHROPLASTY Left 2001&2004    X 2 FOR LEFT HIP  . TRANSTHORACIC ECHOCARDIOGRAM  10/06/2012   EF 55-60%, mod eccentric hypertrophy, grade 2 diastolic dysfunction; mildly calcifed AV annulus with moderate regurg; aortic root mildly dilated; LA severely dailted; PA peak pressure 40mHg  . VARICOSE VEIN SURGERY       SOCIAL HISTORY:  Social History   Social History  . Marital status: Widowed    Spouse name: N/A  . Number of children: 5  . Years of education: 6   Occupational History  .  Retired   Social History Main Topics  . Smoking status: Former Smoker    Packs/day: 1.50    Years: 20.00    Types: Cigarettes    Quit date: 07/05/1975  . Smokeless tobacco: Never Used     Comment: Quit smoking in 1975  . Alcohol use No  . Drug use: No  . Sexual activity: No   Other Topics Concern  . Not on file   Social History Narrative  . No narrative on file    FAMILY HISTORY:  Family History  Problem Relation Age of Onset  . Breast cancer Daughter        also hyperlipidemia  . Breast cancer Daughter        also hyperlipidemia  . Asthma Mother   . Multiple sclerosis Child   . Hyperlipidemia Child   . Colon cancer Neg Hx     CURRENT MEDICATIONS:  Outpatient Encounter Prescriptions as of 01/13/2017  Medication Sig Note  .  clopidogrel (PLAVIX) 75 MG tablet Take 75 mg by mouth daily with breakfast. 11/11/2016: Though patient has stopped this for five days for LESI today.  May resume later today.  [name] Romp, RN 11/11/16  . furosemide (LASIX) 20 MG tablet Take 1 tablet (20 mg total) by mouth daily.   Marland Kitchen gabapentin (NEURONTIN) 100 MG capsule TAKE 2 CAPSULES BY MOUTH 3  TIMES DAILY   . hydrocortisone (ANUSOL-HC) 25 MG suppository Place 1 suppository (25 mg total) rectally every 12 (twelve) hours. (Patient taking differently: Place 25 mg rectally 2 (two) times daily as needed for hemorrhoids or itching. )   . ondansetron (ZOFRAN) 4 MG tablet  Take 1 tablet (4 mg total) by mouth every 8 (eight) hours as needed for nausea or vomiting. (Patient taking differently: Take 4 mg by mouth every morning. *MAY TAKE AS NEEDED DAILY BUT TAKES ONE TABLET IN THE MORNING)   . pantoprazole (PROTONIX) 40 MG tablet Take 1 tablet (40 mg total) by mouth 2 (two) times daily.   . prednisoLONE acetate (PRED FORTE) 1 % ophthalmic suspension Place 1 drop into both eyes 4 (four) times daily.    . psyllium (METAMUCIL) 58.6 % packet Take 1 packet by mouth daily as needed (for constipation).    . sodium chloride (MURO 128) 2 % ophthalmic solution Place 1 drop into both eyes 3 (three) times daily.    . traMADol-acetaminophen (ULTRACET) 37.5-325 MG tablet Take 1 tablet by mouth every 6 (six) hours as needed for moderate pain or severe pain.  05/18/2016: Received from: External Pharmacy  . [DISCONTINUED] potassium chloride SA (K-DUR,KLOR-CON) 20 MEQ tablet Take 1 tablet (20 mEq total) by mouth daily.   . polyethylene glycol-electrolytes (TRILYTE) 420 g solution Take 4,000 mLs by mouth as directed. (Patient not taking: Reported on 01/13/2017)   . prochlorperazine (COMPAZINE) 10 MG tablet Take 1 tablet (10 mg total) by mouth every 6 (six) hours as needed for nausea or vomiting.   . [DISCONTINUED] albuterol (PROVENTIL HFA;VENTOLIN HFA) 108 (90 BASE) MCG/ACT inhaler  Inhale 1-2 puffs into the lungs every 6 (six) hours as needed for wheezing. (Patient not taking: Reported on 01/13/2017)   . [DISCONTINUED] ipratropium-albuterol (DUONEB) 0.5-2.5 (3) MG/3ML SOLN Take 3 mLs by nebulization 3 (three) times daily. (Patient not taking: Reported on 01/13/2017)   . [DISCONTINUED] mometasone-formoterol (DULERA) 100-5 MCG/ACT AERO Inhale 2 puffs into the lungs 2 (two) times daily. (Patient not taking: Reported on 01/13/2017)    No facility-administered encounter medications on file as of 01/13/2017.     ALLERGIES:  Allergies  Allergen Reactions  . Codeine Diarrhea and Nausea Only  . Asa [Aspirin] Rash and Other (See Comments)    Petechiae   . Iron Nausea And Vomiting    Oral iron causes nausea and vomiting  . Penicillins Rash     PHYSICAL EXAM:  ECOG Performance status: 1-2 - Symptomatic; requires occasional assistance.   Vitals:   01/13/17 1016  BP: 131/81  Pulse: 74  Resp: 18  Temp: 98 F (36.7 C)   Filed Weights   01/13/17 1016  Weight: 170 lb 6.4 oz (77.3 kg)    Physical Exam  Constitutional: She is oriented to person, place, and time and well-developed, well-nourished, and in no distress.  HENT:  Head: Normocephalic.  Mouth/Throat: Oropharynx is clear and moist. No oropharyngeal exudate.  Eyes: Conjunctivae are normal. Pupils are equal, round, and reactive to light. No scleral icterus.  Neck: Normal range of motion. Neck supple.  Cardiovascular: Normal rate and regular rhythm.   Pulmonary/Chest: Effort normal and breath sounds normal. No respiratory distress. She has no wheezes. She has no rales.  Abdominal: Soft. Bowel sounds are normal. There is no tenderness. There is no rebound and no guarding.  Musculoskeletal: She exhibits edema (Trace bilat ankle edema).  LE weakness noted; requires assistance to stand and ambulate  Lymphadenopathy:    She has no cervical adenopathy.       Right: No supraclavicular adenopathy present.       Left: No  supraclavicular adenopathy present.  Neurological: She is alert and oriented to person, place, and time. No cranial nerve deficit.  Skin: Skin is warm  and dry. No rash noted.  -Examined bilateral thighs; no evidence of recurrent melanoma.  -Diffuse varicose veins noted to bilateral upper and lower legs.   Psychiatric: Mood, memory, affect and judgment normal.  Nursing note and vitals reviewed.    LABORATORY DATA:  I have reviewed the labs as listed.  CBC    Component Value Date/Time   WBC 4.3 01/13/2017 0932   RBC 4.44 01/13/2017 0932   RBC 4.44 01/13/2017 0932   HGB 12.3 01/13/2017 0932   HGB 11.2 08/29/2015 1057   HCT 39.7 01/13/2017 0932   HCT 34.3 08/29/2015 1057   PLT 177 01/13/2017 0932   MCV 89.4 01/13/2017 0932   MCH 27.7 01/13/2017 0932   MCHC 31.0 01/13/2017 0932   RDW 19.4 (H) 01/13/2017 0932   LYMPHSABS 1.4 01/13/2017 0932   MONOABS 0.2 01/13/2017 0932   EOSABS 0.1 01/13/2017 0932   BASOSABS 0.0 01/13/2017 0932   CMP Latest Ref Rng & Units 01/13/2017 05/28/2016 05/21/2016  Glucose 65 - 99 mg/dL 121(H) 124(H) 103(H)  BUN 6 - 20 mg/dL 15 21(H) 17  Creatinine 0.44 - 1.00 mg/dL 1.01(H) 1.16(H) 0.82  Sodium 135 - 145 mmol/L 140 139 142  Potassium 3.5 - 5.1 mmol/L 3.1(L) 3.5 2.9(L)  Chloride 101 - 111 mmol/L 101 101 103  CO2 22 - 32 mmol/L 30 29 31   Calcium 8.9 - 10.3 mg/dL 8.4(L) 8.4(L) 8.4(L)  Total Protein 6.5 - 8.1 g/dL - - -  Total Bilirubin 0.3 - 1.2 mg/dL - - -  Alkaline Phos 38 - 126 U/L - - -  AST 15 - 41 U/L - - -  ALT 14 - 54 U/L - - -     Ref. Range 01/13/2017 09:32  LDH Latest Ref Range: 98 - 192 U/L 178   PENDING LABS:  Anemia panel, retic, haptoglobin, EPO, path smear review pending      DIAGNOSTIC IMAGING:  *The following radiologic images and reports have been reviewed independently and agree with below findings.  Mammogram: 10/06/16    PATHOLOGY:  (L) thigh melanoma excision: 07/17/06   Re-excision:  08/06/06         ASSESSMENT & PLAN:   Stage I (T1a) melanoma of left thigh, superficial spreading-type:  -Diagnosed in 07/2006; treated with resection, followed by re-excision in 08/2006. Negative surgical margins noted at that time.  -Clinically, no signs of recurrence on physical exam today.  -Return to cancer center in 3 months for follow-up.   Stage 0 DCIS of right breast:  -Diagnosed in 01/1997. S/p surgical resection and patient thinks she had radiation therapy as well.   -Last mammogram on 10/06/16 negative; next mammogram due in 10/2017.  -She will need clinical breast exam at next visit.  -Return to cancer center in 3 months for follow-up.   Iron deficiency anemia:  -Thought to be secondary to GI blood loss.  Remains on Plavix.  She is scheduled for colonoscopy with Dr. Gala [name] in 02/2017.  -Received dose of [name] [name] on 11/25/16 for low ferritin of 7.  Oncology Flowsheet 11/25/2016  ferric carboxymaltose ([name] [name]) IV 750 mg  -Hemoglobin today is normal at 12.3 g/dL. Iron studies and remaining anemia panel are pending for today. We will contact her with results when they are available.   -Return to cancer center in 3 months for continued follow-up.   Nausea:  -E-scribed Compazine to her pharmacy to be taken PRN (particularly while waiting on pharmacy to fill Zofran prescription).  -Encouraged her to push oral intake  of non-caffeinated/non-alcoholic fluids as tolerated.   Hypokalemia:  -K low at 3.1 today. Likely secondary to diuretics.  -States that she still has some potassium pills from previous prescription. Recommended she take K-Dur 20 mEq BID x 2 weeks. -Recommended she follow-up with PCP for long-term management of hypokalemia.      Dispo:  -Return to cancer center in 3 months for follow-up with labs.    All questions were answered to patient's stated satisfaction. Encouraged patient to call with any new concerns or questions before her next visit to the cancer  center and we can certain see her sooner, if needed.    Plan of care discussed with Dr. Oliva Bustard, who agrees with the above aforementioned.    Orders placed this encounter:  Orders Placed This Encounter  Procedures  . CBC with Differential/Platelet  . Comprehensive metabolic panel  . Iron and TIBC  . Ferritin      Mike Craze, NP [facility] [phone]

## 2017-01-13 NOTE — Patient Instructions (Addendum)
[place] at [facility] Discharge Instructions  RECOMMENDATIONS MADE BY THE CONSULTANT AND ANY TEST RESULTS WILL BE SENT TO YOUR REFERRING PHYSICIAN.  You saw Mike Craze, NP, today Return To Clinic in 3 months for Follow Up with labs  See Andee Poles at checkout for appointments.  Thank you for choosing [place] at [facility] to provide your oncology and hematology care.  To afford each patient quality time with our provider, please arrive at least 15 minutes before your scheduled appointment time.    If you have a lab appointment with the [place] please come in thru the  Main Entrance and check in at the main information desk  You need to re-schedule your appointment should you arrive 10 or more minutes late.  We strive to give you quality time with our providers, and arriving late affects you and other patients whose appointments are after yours.  Also, if you no show three or more times for appointments you may be dismissed from the clinic at the providers discretion.     Again, thank you for choosing [facility].  Our hope is that these requests will decrease the amount of time that you wait before being seen by our physicians.       _____________________________________________________________  Should you have questions after your visit to [facility], please contact our office at [phone] between the hours of 8:30 a.m. and 4:30 p.m.  Voicemails left after 4:30 p.m. will not be returned until the following business day.  For prescription refill requests, have your pharmacy contact our office.       Resources For Cancer Patients and their Caregivers ? American Cancer Society: Can assist with transportation, wigs, general needs, runs Look Good Feel Better.        3306303318 ? Cancer Care: Provides financial assistance, online support groups, medication/co-pay assistance.  1-800-813-HOPE  (214) 798-8654) ? [place] Assists [place] cancer patients and their families through emotional , educational and financial support.  850-845-1710 ? [place] DSS Where to apply for food stamps, Medicaid and utility assistance. (601) 026-7577 ? RCATS: Transportation to medical appointments. 212-207-2440 ? Social Security Administration: May apply for disability if have a Stage IV cancer. (551) 226-5068 531-594-9656 ? [employer], Disability and Transit Services: Assists with nutrition, care and transit needs. [place] Support Programs: @10RELATIVEDAYS @ > Cancer Support Group  2nd Tuesday of the month 1pm-2pm, Journey Room  > Creative Journey  3rd Tuesday of the month 1130am-1pm, Journey Room  > Look Good Feel Better  1st Wednesday of the month 10am-12 noon, Journey Room (Call [place] to register [phone])

## 2017-01-14 ENCOUNTER — Other Ambulatory Visit (HOSPITAL_COMMUNITY): Payer: Self-pay | Admitting: Oncology

## 2017-01-14 DIAGNOSIS — E538 Deficiency of other specified B group vitamins: Secondary | ICD-10-CM

## 2017-01-14 LAB — HAPTOGLOBIN: HAPTOGLOBIN: 77 mg/dL (ref 34–200)

## 2017-01-14 LAB — PATHOLOGIST SMEAR REVIEW

## 2017-01-14 LAB — ERYTHROPOIETIN: Erythropoietin: 18.7 m[IU]/mL — ABNORMAL HIGH (ref 2.6–18.5)

## 2017-01-21 ENCOUNTER — Encounter (HOSPITAL_COMMUNITY): Payer: Medicare Other

## 2017-01-21 ENCOUNTER — Encounter (HOSPITAL_COMMUNITY): Payer: Self-pay

## 2017-01-21 ENCOUNTER — Encounter (HOSPITAL_BASED_OUTPATIENT_CLINIC_OR_DEPARTMENT_OTHER): Payer: Medicare Other

## 2017-01-21 VITALS — BP 131/46 | HR 47 | Temp 98.0°F | Resp 20

## 2017-01-21 DIAGNOSIS — E538 Deficiency of other specified B group vitamins: Secondary | ICD-10-CM

## 2017-01-21 DIAGNOSIS — D509 Iron deficiency anemia, unspecified: Secondary | ICD-10-CM | POA: Diagnosis present

## 2017-01-21 DIAGNOSIS — C439 Malignant melanoma of skin, unspecified: Secondary | ICD-10-CM | POA: Diagnosis not present

## 2017-01-21 MED ORDER — CYANOCOBALAMIN 1000 MCG/ML IJ SOLN
1000.0000 ug | Freq: Once | INTRAMUSCULAR | Status: AC
  Administered 2017-01-21: 1000 ug via INTRAMUSCULAR

## 2017-01-21 MED ORDER — SODIUM CHLORIDE 0.9 % IV SOLN
Freq: Once | INTRAVENOUS | Status: AC
  Administered 2017-01-21: 11:00:00 via INTRAVENOUS

## 2017-01-21 MED ORDER — SODIUM CHLORIDE 0.9 % IV SOLN
510.0000 mg | Freq: Once | INTRAVENOUS | Status: AC
  Administered 2017-01-21: 510 mg via INTRAVENOUS
  Filled 2017-01-21: qty 17

## 2017-01-21 MED ORDER — CYANOCOBALAMIN 1000 MCG/ML IJ SOLN
INTRAMUSCULAR | Status: AC
  Filled 2017-01-21: qty 1

## 2017-01-21 NOTE — Patient Instructions (Signed)
[facility] at [facility] Discharge Instructions  RECOMMENDATIONS MADE BY THE CONSULTANT AND ANY TEST RESULTS WILL BE SENT TO YOUR REFERRING PHYSICIAN.  B12 injection and feraheme given today  Follow up as scheduled.  Thank you for choosing [facility] at [facility] to provide your oncology and hematology care.  To afford each patient quality time with our provider, please arrive at least 15 minutes before your scheduled appointment time.    If you have a lab appointment with the Cancer Center please come in thru the  Main Entrance and check in at the main information desk  You need to re-schedule your appointment should you arrive 10 or more minutes late.  We strive to give you quality time with our providers, and arriving late affects you and other patients whose appointments are after yours.  Also, if you no show three or more times for appointments you may be dismissed from the clinic at the providers discretion.     Again, thank you for choosing [facility].  Our hope is that these requests will decrease the amount of time that you wait before being seen by our physicians.       _____________________________________________________________  Should you have questions after your visit to [facility], please contact our office at [phone] between the hours of 8:30 a.m. and 4:30 p.m.  Voicemails left after 4:30 p.m. will not be returned until the following business day.  For prescription refill requests, have your pharmacy contact our office.       Resources For Cancer Patients and their Caregivers ? American Cancer Society: Can assist with transportation, wigs, general needs, runs Look Good Feel Better.        1-888-227-6333 ? Cancer Care: Provides financial assistance, online support groups, medication/co-pay assistance.  1-800-813-HOPE (4673) ? [facility] Assists [place]  cancer patients and their families through emotional , educational and financial support.  336-427-4357 ? [place] DSS Where to apply for food stamps, Medicaid and utility assistance. 336-342-1394 ? RCATS: Transportation to medical appointments. 336-347-2287 ? Social Security Administration: May apply for disability if have a Stage IV cancer. 336-342-7796 1-800-772-1213 ? [place] Aging, Disability and Transit Services: Assists with nutrition, care and transit needs. 336-349-2343  Cancer Center Support Programs: @10RELATIVEDAYS@ > Cancer Support Group  2nd Tuesday of the month 1pm-2pm, Journey Room  > Creative Journey  3rd Tuesday of the month 1130am-1pm, Journey Room  > Look Good Feel Better  1st Wednesday of the month 10am-12 noon, Journey Room (Call American Cancer Society to register 1-800-395-5775)   

## 2017-01-21 NOTE — Progress Notes (Signed)
Patient discharged in wheelchair with daughter accompanying her. Pt in stable condition.

## 2017-01-21 NOTE — Progress Notes (Signed)
[name] [name] presents today for injection per MD orders. B12 1,020mcg administered SQ in left Upper Arm. Administration without incident. Patient tolerated well.   Feraheme given today per orders. Patient tolerated it well without problems. Vitals stable and discharged home from clinic via wheelchair with daughter at her side. Follow up as scheduled.

## 2017-01-22 LAB — ANTI-PARIETAL ANTIBODY: Parietal Cell Antibody-IgG: 4.8 Units (ref 0.0–20.0)

## 2017-01-22 LAB — INTRINSIC FACTOR ANTIBODIES: Intrinsic Factor: 1 AU/mL (ref 0.0–1.1)

## 2017-01-23 LAB — METHYLMALONIC ACID, SERUM: METHYLMALONIC ACID, QUANTITATIVE: 364 nmol/L (ref 0–378)

## 2017-01-23 LAB — HOMOCYSTEINE: HOMOCYSTEINE-NORM: 16.3 umol/L — AB (ref 0.0–15.0)

## 2017-01-26 ENCOUNTER — Other Ambulatory Visit (HOSPITAL_COMMUNITY): Payer: Self-pay | Admitting: Oncology

## 2017-01-26 DIAGNOSIS — E7211 Homocystinuria: Principal | ICD-10-CM

## 2017-01-26 DIAGNOSIS — E538 Deficiency of other specified B group vitamins: Secondary | ICD-10-CM

## 2017-01-26 DIAGNOSIS — R7989 Other specified abnormal findings of blood chemistry: Secondary | ICD-10-CM | POA: Insufficient documentation

## 2017-01-26 MED ORDER — FOLIC ACID 1 MG PO TABS: 1.0000 mg | ORAL_TABLET | Freq: Every day | ORAL | 11 refills | Status: DC

## 2017-01-26 MED ORDER — CYANOCOBALAMIN 2000 MCG PO TABS: 2000.0000 ug | ORAL_TABLET | Freq: Every day | ORAL | 11 refills | Status: DC

## 2017-01-27 ENCOUNTER — Telehealth: Payer: Self-pay

## 2017-01-27 NOTE — Telephone Encounter (Signed)
Pt's daughter called office to cancel her TCS with RMR for 02/06/17. Her hip has been bothering her and she wants to wait until she feels better. LMOVM and informed Endo Scheduler to cancel procedure.   Routing to RMR as FYI.

## 2017-01-28 ENCOUNTER — Encounter (HOSPITAL_BASED_OUTPATIENT_CLINIC_OR_DEPARTMENT_OTHER): Payer: Medicare Other

## 2017-01-28 VITALS — BP 125/64 | HR 74 | Temp 98.0°F | Resp 18

## 2017-01-28 DIAGNOSIS — D509 Iron deficiency anemia, unspecified: Secondary | ICD-10-CM

## 2017-01-28 MED ORDER — CYANOCOBALAMIN 1000 MCG/ML IJ SOLN
1000.0000 ug | Freq: Once | INTRAMUSCULAR | Status: AC
  Administered 2017-01-28: 1000 ug via INTRAMUSCULAR

## 2017-01-28 MED ORDER — CYANOCOBALAMIN 1000 MCG/ML IJ SOLN
INTRAMUSCULAR | Status: AC
  Filled 2017-01-28: qty 1

## 2017-01-28 NOTE — Progress Notes (Signed)
[name] [name] presents today for injection per MD orders. B12 1000 mcg administered IM in left Upper Arm. Administration without incident. Patient tolerated well. Stable on discharge home with daughter via wheelchair.

## 2017-01-28 NOTE — Patient Instructions (Signed)
[place] at [facility] Discharge Instructions  RECOMMENDATIONS MADE BY THE CONSULTANT AND ANY TEST RESULTS WILL BE SENT TO YOUR REFERRING PHYSICIAN.  Vitamin B12 1000 mcg injection (number 2 of 4) given as ordered. Return as scheduled.  Thank you for choosing [place] at [facility] to provide your oncology and hematology care.  To afford each patient quality time with our provider, please arrive at least 15 minutes before your scheduled appointment time.    If you have a lab appointment with the [place] please come in thru the  Main Entrance and check in at the main information desk  You need to re-schedule your appointment should you arrive 10 or more minutes late.  We strive to give you quality time with our providers, and arriving late affects you and other patients whose appointments are after yours.  Also, if you no show three or more times for appointments you may be dismissed from the clinic at the providers discretion.     Again, thank you for choosing [facility].  Our hope is that these requests will decrease the amount of time that you wait before being seen by our physicians.       _____________________________________________________________  Should you have questions after your visit to [facility], please contact our office at [phone] between the hours of 8:30 a.m. and 4:30 p.m.  Voicemails left after 4:30 p.m. will not be returned until the following business day.  For prescription refill requests, have your pharmacy contact our office.       Resources For Cancer Patients and their Caregivers ? American Cancer Society: Can assist with transportation, wigs, general needs, runs Look Good Feel Better.        938 392 5332 ? Cancer Care: Provides financial assistance, online support groups, medication/co-pay assistance.  1-800-813-HOPE 605-039-1241) ? [place] Assists [place] cancer patients and their families through emotional , educational and financial support.  (301)025-2522 ? [place] DSS Where to apply for food stamps, Medicaid and utility assistance. (530)750-0218 ? RCATS: Transportation to medical appointments. (562)535-8541 ? Social Security Administration: May apply for disability if have a Stage IV cancer. (906)674-0413 970 734 1526 ? [employer], Disability and Transit Services: Assists with nutrition, care and transit needs. [place] Support Programs: @10RELATIVEDAYS @ > Cancer Support Group  2nd Tuesday of the month 1pm-2pm, Journey Room  > Creative Journey  3rd Tuesday of the month 1130am-1pm, Journey Room  > Look Good Feel Better  1st Wednesday of the month 10am-12 noon, Journey Room (Call [place] to register [phone])

## 2017-02-03 ENCOUNTER — Encounter (HOSPITAL_COMMUNITY): Payer: Self-pay

## 2017-02-03 ENCOUNTER — Encounter (HOSPITAL_COMMUNITY): Payer: Medicare Other | Attending: Oncology

## 2017-02-03 VITALS — BP 126/46 | HR 67 | Temp 97.8°F | Resp 18

## 2017-02-03 DIAGNOSIS — D509 Iron deficiency anemia, unspecified: Secondary | ICD-10-CM

## 2017-02-03 DIAGNOSIS — C439 Malignant melanoma of skin, unspecified: Secondary | ICD-10-CM | POA: Insufficient documentation

## 2017-02-03 MED ORDER — CYANOCOBALAMIN 1000 MCG/ML IJ SOLN
1000.0000 ug | Freq: Once | INTRAMUSCULAR | Status: AC
  Administered 2017-02-03: 1000 ug via INTRAMUSCULAR
  Filled 2017-02-03: qty 1

## 2017-02-03 NOTE — Progress Notes (Signed)
[name] [name] presents today for injection per MD orders. B12 1,054mcg administered Im in right Upper Arm. Administration without incident. Patient tolerated well. Vitals stable and discharged home from clinic ambulatory. Follow up as scheduled.

## 2017-02-03 NOTE — Patient Instructions (Signed)
[facility] at [facility] Discharge Instructions  RECOMMENDATIONS MADE BY THE CONSULTANT AND ANY TEST RESULTS WILL BE SENT TO YOUR REFERRING PHYSICIAN.  B12 injection given Follow up as scheduled.  Thank you for choosing [facility] at [facility] to provide your oncology and hematology care.  To afford each patient quality time with our provider, please arrive at least 15 minutes before your scheduled appointment time.    If you have a lab appointment with the Cancer Center please come in thru the  Main Entrance and check in at the main information desk  You need to re-schedule your appointment should you arrive 10 or more minutes late.  We strive to give you quality time with our providers, and arriving late affects you and other patients whose appointments are after yours.  Also, if you no show three or more times for appointments you may be dismissed from the clinic at the providers discretion.     Again, thank you for choosing [facility].  Our hope is that these requests will decrease the amount of time that you wait before being seen by our physicians.       _____________________________________________________________  Should you have questions after your visit to [facility], please contact our office at [phone] between the hours of 8:30 a.m. and 4:30 p.m.  Voicemails left after 4:30 p.m. will not be returned until the following business day.  For prescription refill requests, have your pharmacy contact our office.       Resources For Cancer Patients and their Caregivers ? American Cancer Society: Can assist with transportation, wigs, general needs, runs Look Good Feel Better.        1-888-227-6333 ? Cancer Care: Provides financial assistance, online support groups, medication/co-pay assistance.  1-800-813-HOPE (4673) ? [facility] Assists [place] cancer patients and  their families through emotional , educational and financial support.  336-427-4357 ? [place] DSS Where to apply for food stamps, Medicaid and utility assistance. 336-342-1394 ? RCATS: Transportation to medical appointments. 336-347-2287 ? Social Security Administration: May apply for disability if have a Stage IV cancer. 336-342-7796 1-800-772-1213 ? [place] Aging, Disability and Transit Services: Assists with nutrition, care and transit needs. 336-349-2343  Cancer Center Support Programs: @10RELATIVEDAYS@ > Cancer Support Group  2nd Tuesday of the month 1pm-2pm, Journey Room  > Creative Journey  3rd Tuesday of the month 1130am-1pm, Journey Room  > Look Good Feel Better  1st Wednesday of the month 10am-12 noon, Journey Room (Call American Cancer Society to register 1-800-395-5775)   

## 2017-02-06 ENCOUNTER — Ambulatory Visit (HOSPITAL_COMMUNITY): Payer: Medicare Other

## 2017-02-06 ENCOUNTER — Encounter (HOSPITAL_COMMUNITY): Payer: Self-pay

## 2017-02-06 ENCOUNTER — Ambulatory Visit (HOSPITAL_COMMUNITY): Admit: 2017-02-06 | Payer: Medicare Other | Admitting: Internal Medicine

## 2017-02-06 SURGERY — COLONOSCOPY
Anesthesia: Moderate Sedation

## 2017-02-10 ENCOUNTER — Encounter: Payer: Self-pay | Admitting: Family Medicine

## 2017-02-10 ENCOUNTER — Ambulatory Visit (INDEPENDENT_AMBULATORY_CARE_PROVIDER_SITE_OTHER): Payer: Medicare Other | Admitting: Family Medicine

## 2017-02-10 VITALS — BP 130/68 | HR 68 | Temp 98.1°F | Resp 16 | Ht 65.0 in | Wt 171.0 lb

## 2017-02-10 DIAGNOSIS — N2889 Other specified disorders of kidney and ureter: Secondary | ICD-10-CM

## 2017-02-10 DIAGNOSIS — I7121 Aneurysm of the ascending aorta, without rupture: Secondary | ICD-10-CM

## 2017-02-10 DIAGNOSIS — I351 Nonrheumatic aortic (valve) insufficiency: Secondary | ICD-10-CM

## 2017-02-10 DIAGNOSIS — E538 Deficiency of other specified B group vitamins: Secondary | ICD-10-CM

## 2017-02-10 DIAGNOSIS — Z23 Encounter for immunization: Secondary | ICD-10-CM

## 2017-02-10 DIAGNOSIS — I712 Thoracic aortic aneurysm, without rupture: Secondary | ICD-10-CM

## 2017-02-10 DIAGNOSIS — C439 Malignant melanoma of skin, unspecified: Secondary | ICD-10-CM

## 2017-02-10 DIAGNOSIS — D508 Other iron deficiency anemias: Secondary | ICD-10-CM | POA: Diagnosis not present

## 2017-02-10 MED ORDER — TRAMADOL-ACETAMINOPHEN 37.5-325 MG PO TABS: 1.0000 | ORAL_TABLET | Freq: Four times a day (QID) | ORAL | 2 refills | Status: DC | PRN

## 2017-02-10 NOTE — Progress Notes (Signed)
Subjective:    Patient ID: [name] [name], female    DOB: Aug 07, 1922, 83 y.o.   MRN: [id number]  HPI  Patient is a 83 year old female here today to establish care. Past medical history is significant for melanoma on the left thigh status post surgical excision. She also has a history of ductal carcinoma in situ of the breast status post surgical excision. Most recent mammogram was in March and was clear. She also has a history of iron deficiency anemia of chronic blood loss attributed to hemorrhoids. She receives iron infusions as she cannot tolerate oral iron. Her last iron infusion was earlier this summer or late spring. Her most recent hemoglobin was 12.8 in June. She will be due to recheck this in 3 months. She also has a history of vitamin B12 deficiency and has received 1000 g vitamin B12 injection weekly at the cancer center for one month. They recommended that she take 2000 g a day thereafter and then recheck a level. She also has a history of a mass on her right kidney that is 12 mm in size and is most consistent with renal cell carcinoma. According to the daughter, because of her advanced age, that have made the recommendation not to surgically remove this. This is being monitored at the present time. Overall patient is doing well. She is due for Prevnar 13. Past Medical History:  Diagnosis Date  . Aneurysm, thoracic aortic ([place])   . Aortic insufficiency   . Ascending aortic aneurysm ([place])    CT in 2012 - 4.2x4.2cm  . Bladder infection    h/o  . 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 Dehydration    HISTORY   . Fall at home 09/10/12  . Hemorrhoids   . Hip fracture ([place])    hip surgery 2001  . History of blood clots    in leg  . History of knee surgery   . Hypertension   . IDA (iron deficiency anemia) 06/07/2013  . Iron deficiency anemia, unspecified 03/14/2013   secondary to GI blood loss  . Kidney infection    h/o  . Melanoma of skin ([place])  03/14/2013  . Melanoma of thigh ([place])    left  . Mini stroke ([place])   . S/P colonoscopy March 2010   RMR: friable anal canal hemorrhoids, hyperplastic ascending polyp, adenomatous descending polyp   . Stomach ulcer    secondary to h.pylori, s/p treatment  . Thyroid condition   . Venous stasis    edema   Past Surgical History:  Procedure Laterality Date  . APPENDECTOMY  1942  . BREAST LUMPECTOMY  1998  . CATARACT EXTRACTION, BILATERAL    . CHOLECYSTECTOMY    . COLONOSCOPY  06/05/11   pancolonic diverticulosis/ileal reosion/abnormal anorectal junction s/p biopsy: path for small intestine and Ti was benign with non-villous atrophy, rectal biopsy with prominent prolapse changes, no acute inflammation  . CORNEAL TRANSPLANT Bilateral 2013  . ENTEROSCOPY N/A 06/13/2013   KDT:OIZT Gastritis/GI BLEED MOST LIKELY DUE TO DUODENAL ULCERS, AND ? AVMs  . ESOPHAGOGASTRODUODENOSCOPY  06/05/11   small hiatal hernia; + H.PYLORI GASTRITIS, s/p 5 days of Pylera, unable to finish due to N/V  . ESOPHAGOGASTRODUODENOSCOPY N/A 06/13/2013   Dr. Oneida Alar- see enteroscopy  . ESOPHAGOGASTRODUODENOSCOPY (EGD) WITH ESOPHAGEAL DILATION N/A 06/08/2013   IWP:YKDXI hiatal hernia;  otherwise normal EGD s/p dilation  . GIVENS CAPSULE STUDY N/A 06/08/2013   Procedure: GIVENS CAPSULE STUDY;  Surgeon: Herbie Baltimore  Hilton Cork, MD;  Location: [facility] SUITE;  Service: Endoscopy;  Laterality: N/A;  . HEMORRHOID BANDING    . KNEE SURGERY Right 05/2006   total right  . LEG SURGERY    . MELANOMA EXCISION Left 08/2006   left leg excision  . NM MYOCAR PERF WALL MOTION  03/27/2010   dipyridamole; small reversible basal to mid-septal defect (?artifact), post-stress EF 55%, low risk scan   . PARTIAL HYSTERECTOMY  1976  . TONSILLECTOMY    . TOTAL HIP ARTHROPLASTY Left 2001&2004    X 2 FOR LEFT HIP  . TRANSTHORACIC ECHOCARDIOGRAM  10/06/2012   EF 55-60%, mod eccentric hypertrophy, grade 2 diastolic dysfunction; mildly calcifed AV annulus with  moderate regurg; aortic root mildly dilated; LA severely dailted; PA peak pressure 52mHg  . VARICOSE VEIN SURGERY     Current Outpatient Prescriptions on File Prior to Visit  Medication Sig Dispense Refill  . clopidogrel (PLAVIX) 75 MG tablet Take 75 mg by mouth daily with breakfast.    . cyanocobalamin ([employer] VITAMIN B12) 2000 MCG tablet Take 1 tablet (2,000 mcg total) by mouth daily. 30 tablet 11  . folic acid (FOLVITE) 1 MG tablet Take 1 tablet (1 mg total) by mouth daily. 30 tablet 11  . furosemide (LASIX) 20 MG tablet Take 1 tablet (20 mg total) by mouth daily. 30 tablet 1  . gabapentin (NEURONTIN) 100 MG capsule TAKE 2 CAPSULES BY MOUTH 3  TIMES DAILY 540 capsule 1  . hydrocortisone (ANUSOL-HC) 25 MG suppository Place 1 suppository (25 mg total) rectally every 12 (twelve) hours. (Patient taking differently: Place 25 mg rectally 2 (two) times daily as needed for hemorrhoids or itching. ) 12 suppository 1  . ondansetron (ZOFRAN) 4 MG tablet Take 1 tablet (4 mg total) by mouth every 8 (eight) hours as needed for nausea or vomiting. (Patient taking differently: Take 4 mg by mouth every morning. *MAY TAKE AS NEEDED DAILY BUT TAKES ONE TABLET IN THE MORNING) 30 tablet 0  . pantoprazole (PROTONIX) 40 MG tablet Take 1 tablet (40 mg total) by mouth 2 (two) times daily. 60 tablet 11  . prednisoLONE acetate (PRED FORTE) 1 % ophthalmic suspension Place 1 drop into both eyes 4 (four) times daily.     . prochlorperazine (COMPAZINE) 10 MG tablet Take 1 tablet (10 mg total) by mouth every 6 (six) hours as needed for nausea or vomiting. 45 tablet 3  . psyllium (METAMUCIL) 58.6 % packet Take 1 packet by mouth daily as needed (for constipation).     . sodium chloride (MURO 128) 2 % ophthalmic solution Place 1 drop into both eyes 3 (three) times daily.     . potassium chloride SA (K-DUR,KLOR-CON) 20 MEQ tablet Take 2 tablets (40 mEq total) by mouth daily. (Patient not taking: Reported on 02/10/2017) 60 tablet 0    No current facility-administered medications on file prior to visit.    Allergies  Allergen Reactions  . Codeine Diarrhea and Nausea Only  . Asa [Aspirin] Rash and Other (See Comments)    Petechiae   . Iron Nausea And Vomiting    Oral iron causes nausea and vomiting  . Penicillins Rash   Social History   Social History  . Marital status: Widowed    Spouse name: N/A  . Number of children: 5  . Years of education: 6   Occupational History  .  Retired   Social History Main Topics  . Smoking status: Former Smoker    Packs/day: 1.50  Years: 20.00    Types: Cigarettes    Quit date: 07/05/1975  . Smokeless tobacco: Never Used     Comment: Quit smoking in 1975  . Alcohol use No  . Drug use: No  . Sexual activity: No   Other Topics Concern  . Not on file   Social History Narrative  . No narrative on file   Family History  Problem Relation Age of Onset  . Breast cancer Daughter        also hyperlipidemia  . Breast cancer Daughter        also hyperlipidemia  . Asthma Mother   . Multiple sclerosis Child   . Hyperlipidemia Child   . Colon cancer Neg Hx      Review of Systems  All other systems reviewed and are negative.      Objective:   Physical Exam  Constitutional: She is oriented to person, place, and time. She appears well-developed and well-nourished. No distress.  HENT:  Right Ear: External ear normal.  Left Ear: External ear normal.  Nose: Nose normal.  Mouth/Throat: Oropharynx is clear and moist. No oropharyngeal exudate.  Eyes: Conjunctivae are normal.  Neck: Neck supple. No JVD present. No thyromegaly present.  Cardiovascular: Normal rate, regular rhythm and normal heart sounds.   Pulmonary/Chest: Effort normal and breath sounds normal. No respiratory distress. She has no wheezes. She has no rales.  Abdominal: Soft. Bowel sounds are normal. She exhibits no distension and no mass. There is no tenderness. There is no rebound and no guarding.   Musculoskeletal: She exhibits edema.  Lymphadenopathy:    She has no cervical adenopathy.  Neurological: She is alert and oriented to person, place, and time. She displays normal reflexes. No cranial nerve deficit. She exhibits normal muscle tone. Coordination normal.  Skin: No rash noted. No erythema. No pallor.  Psychiatric: She has a normal mood and affect. Her behavior is normal. Judgment and thought content normal.  Vitals reviewed.         Assessment & Plan:  Melanoma of skin ([place])  Other iron deficiency anemia  B12 deficiency  Ascending aortic aneurysm (H[facility])  Aortic valve insufficiency, etiology of cardiac valve disease unspecified  Right renal mass  Patient is very sharp and seems to be in remarkable health for her age. Currently she is being monitored annually with a regular exam for recurrence of her melanoma or breast cancer. She is seen at the cancer center in [place]. I would like her to return in 3 months to repeat a CBC, ferritin level, as well as a vitamin B12 level. If her vitamin B12 is low at that time despite oral replacement, I would recommend monthly parenteral B12 injections. She cannot tolerate oral iron. Therefore her hemoglobin drops below 10, I would recommend a repeat iron infusion. Given her advanced age, I would recommend against any surgical intervention on her ascending aortic aneurysm. At the present time she is asymptomatic from the aortic valve insufficiency. She denies any syncope or shortness of breath or chest pain. They have elected not to treat the mass on her right kidney. Therefore I would like to see her back in 3 months to repeat lab work including a CBC, CMP, TSH, iron level, vitamin B-12 level.

## 2017-02-10 NOTE — Addendum Note (Signed)
Addended by: Shary Decamp B on: 02/10/2017 12:53 PM   Modules accepted: Orders

## 2017-02-13 ENCOUNTER — Encounter (HOSPITAL_COMMUNITY): Payer: Self-pay

## 2017-02-13 ENCOUNTER — Encounter (HOSPITAL_BASED_OUTPATIENT_CLINIC_OR_DEPARTMENT_OTHER): Payer: Medicare Other

## 2017-02-13 VITALS — BP 107/81 | HR 66 | Temp 97.8°F | Resp 18

## 2017-02-13 DIAGNOSIS — D509 Iron deficiency anemia, unspecified: Secondary | ICD-10-CM | POA: Diagnosis present

## 2017-02-13 MED ORDER — CYANOCOBALAMIN 1000 MCG/ML IJ SOLN
1000.0000 ug | Freq: Once | INTRAMUSCULAR | Status: AC
  Administered 2017-02-13: 1000 ug via INTRAMUSCULAR
  Filled 2017-02-13: qty 1

## 2017-02-13 NOTE — Progress Notes (Signed)
[name] [name] presents today for injection per the provider's orders.  B12 administration without incident; see MAR for injection details.  Patient tolerated procedure well and without incident.  No questions or complaints noted at this time.  Discharged via wheelchair in c/o family.

## 2017-03-16 ENCOUNTER — Encounter: Payer: Self-pay | Admitting: Family Medicine

## 2017-03-16 ENCOUNTER — Ambulatory Visit (INDEPENDENT_AMBULATORY_CARE_PROVIDER_SITE_OTHER): Payer: Medicare Other | Admitting: Family Medicine

## 2017-03-16 ENCOUNTER — Ambulatory Visit (HOSPITAL_COMMUNITY): Payer: Medicare Other

## 2017-03-16 ENCOUNTER — Emergency Department (HOSPITAL_COMMUNITY): Payer: Medicare Other

## 2017-03-16 ENCOUNTER — Inpatient Hospital Stay (HOSPITAL_COMMUNITY)
Admission: EM | Admit: 2017-03-16 | Discharge: 2017-03-19 | DRG: 281 | Disposition: A | Discharge status: Home / Self Care | Payer: Medicare Other | Attending: Internal Medicine | Admitting: Internal Medicine

## 2017-03-16 ENCOUNTER — Encounter (HOSPITAL_COMMUNITY): Payer: Self-pay

## 2017-03-16 VITALS — BP 138/84 | HR 68 | Temp 98.4°F | Resp 20 | Wt 172.0 lb

## 2017-03-16 DIAGNOSIS — I4510 Unspecified right bundle-branch block: Secondary | ICD-10-CM | POA: Diagnosis not present

## 2017-03-16 DIAGNOSIS — I351 Nonrheumatic aortic (valve) insufficiency: Secondary | ICD-10-CM | POA: Diagnosis not present

## 2017-03-16 DIAGNOSIS — E876 Hypokalemia: Secondary | ICD-10-CM

## 2017-03-16 DIAGNOSIS — R079 Chest pain, unspecified: Secondary | ICD-10-CM | POA: Diagnosis present

## 2017-03-16 DIAGNOSIS — I480 Paroxysmal atrial fibrillation: Secondary | ICD-10-CM | POA: Diagnosis present

## 2017-03-16 DIAGNOSIS — I482 Chronic atrial fibrillation, unspecified: Secondary | ICD-10-CM

## 2017-03-16 DIAGNOSIS — C641 Malignant neoplasm of right kidney, except renal pelvis: Secondary | ICD-10-CM | POA: Diagnosis present

## 2017-03-16 DIAGNOSIS — I7121 Aneurysm of the ascending aorta, without rupture: Secondary | ICD-10-CM | POA: Diagnosis present

## 2017-03-16 DIAGNOSIS — Z7902 Long term (current) use of antithrombotics/antiplatelets: Secondary | ICD-10-CM

## 2017-03-16 DIAGNOSIS — N179 Acute kidney failure, unspecified: Secondary | ICD-10-CM | POA: Diagnosis present

## 2017-03-16 DIAGNOSIS — I272 Pulmonary hypertension, unspecified: Secondary | ICD-10-CM | POA: Diagnosis not present

## 2017-03-16 DIAGNOSIS — R600 Localized edema: Secondary | ICD-10-CM | POA: Diagnosis present

## 2017-03-16 DIAGNOSIS — I10 Essential (primary) hypertension: Secondary | ICD-10-CM | POA: Diagnosis present

## 2017-03-16 DIAGNOSIS — Z85820 Personal history of malignant melanoma of skin: Secondary | ICD-10-CM

## 2017-03-16 DIAGNOSIS — Z66 Do not resuscitate: Secondary | ICD-10-CM | POA: Diagnosis present

## 2017-03-16 DIAGNOSIS — R0789 Other chest pain: Secondary | ICD-10-CM | POA: Diagnosis not present

## 2017-03-16 DIAGNOSIS — D519 Vitamin B12 deficiency anemia, unspecified: Secondary | ICD-10-CM | POA: Diagnosis present

## 2017-03-16 DIAGNOSIS — E860 Dehydration: Secondary | ICD-10-CM | POA: Diagnosis present

## 2017-03-16 DIAGNOSIS — Z8679 Personal history of other diseases of the circulatory system: Secondary | ICD-10-CM

## 2017-03-16 DIAGNOSIS — I5181 Takotsubo syndrome: Secondary | ICD-10-CM

## 2017-03-16 DIAGNOSIS — E785 Hyperlipidemia, unspecified: Secondary | ICD-10-CM | POA: Diagnosis present

## 2017-03-16 DIAGNOSIS — I712 Thoracic aortic aneurysm, without rupture: Secondary | ICD-10-CM | POA: Diagnosis present

## 2017-03-16 DIAGNOSIS — I214 Non-ST elevation (NSTEMI) myocardial infarction: Principal | ICD-10-CM | POA: Diagnosis present

## 2017-03-16 DIAGNOSIS — E538 Deficiency of other specified B group vitamins: Secondary | ICD-10-CM | POA: Diagnosis present

## 2017-03-16 DIAGNOSIS — Z8673 Personal history of transient ischemic attack (TIA), and cerebral infarction without residual deficits: Secondary | ICD-10-CM

## 2017-03-16 DIAGNOSIS — H9190 Unspecified hearing loss, unspecified ear: Secondary | ICD-10-CM | POA: Diagnosis present

## 2017-03-16 DIAGNOSIS — R0602 Shortness of breath: Secondary | ICD-10-CM | POA: Diagnosis not present

## 2017-03-16 DIAGNOSIS — D509 Iron deficiency anemia, unspecified: Secondary | ICD-10-CM | POA: Diagnosis present

## 2017-03-16 DIAGNOSIS — K649 Unspecified hemorrhoids: Secondary | ICD-10-CM | POA: Diagnosis present

## 2017-03-16 DIAGNOSIS — Z853 Personal history of malignant neoplasm of breast: Secondary | ICD-10-CM

## 2017-03-16 LAB — HEPATIC FUNCTION PANEL
ALBUMIN: 3.7 g/dL (ref 3.5–5.0)
ALT: 10 U/L — AB (ref 14–54)
AST: 25 U/L (ref 15–41)
Alkaline Phosphatase: 62 U/L (ref 38–126)
BILIRUBIN DIRECT: 0.2 mg/dL (ref 0.1–0.5)
BILIRUBIN TOTAL: 1.2 mg/dL (ref 0.3–1.2)
Indirect Bilirubin: 1 mg/dL — ABNORMAL HIGH (ref 0.3–0.9)
Total Protein: 6.6 g/dL (ref 6.5–8.1)

## 2017-03-16 LAB — CBC
HEMATOCRIT: 39.5 % (ref 36.0–46.0)
HEMOGLOBIN: 12.6 g/dL (ref 12.0–15.0)
MCH: 29.8 pg (ref 26.0–34.0)
MCHC: 31.9 g/dL (ref 30.0–36.0)
MCV: 93.4 fL (ref 78.0–100.0)
Platelets: 160 10*3/uL (ref 150–400)
RBC: 4.23 MIL/uL (ref 3.87–5.11)
RDW: 14.8 % (ref 11.5–15.5)
WBC: 5 10*3/uL (ref 4.0–10.5)

## 2017-03-16 LAB — I-STAT TROPONIN, ED: TROPONIN I, POC: 0.02 ng/mL (ref 0.00–0.08)

## 2017-03-16 LAB — BASIC METABOLIC PANEL
ANION GAP: 9 (ref 5–15)
BUN: 12 mg/dL (ref 6–20)
CALCIUM: 8.4 mg/dL — AB (ref 8.9–10.3)
CHLORIDE: 103 mmol/L (ref 101–111)
CO2: 29 mmol/L (ref 22–32)
Creatinine, Ser: 1.09 mg/dL — ABNORMAL HIGH (ref 0.44–1.00)
GFR calc non Af Amer: 43 mL/min — ABNORMAL LOW (ref >60)
GFR, EST AFRICAN AMERICAN: 49 mL/min — AB (ref >60)
GLUCOSE: 104 mg/dL — AB (ref 65–99)
POTASSIUM: 3.3 mmol/L — AB (ref 3.5–5.1)
Sodium: 141 mmol/L (ref 135–145)

## 2017-03-16 LAB — LIPASE, BLOOD: Lipase: 27 U/L (ref 11–51)

## 2017-03-16 LAB — MAGNESIUM: MAGNESIUM: 2.2 mg/dL (ref 1.7–2.4)

## 2017-03-16 MED ORDER — POTASSIUM CHLORIDE CRYS ER 20 MEQ PO TBCR
40.0000 meq | EXTENDED_RELEASE_TABLET | Freq: Once | ORAL | Status: AC
  Administered 2017-03-16: 40 meq via ORAL
  Filled 2017-03-16: qty 2

## 2017-03-16 MED ORDER — SODIUM CHLORIDE (HYPERTONIC) 2 % OP SOLN
1.0000 [drp] | Freq: Three times a day (TID) | OPHTHALMIC | Status: DC
  Administered 2017-03-17 – 2017-03-19 (×9): 1 [drp] via OPHTHALMIC
  Filled 2017-03-16: qty 15

## 2017-03-16 MED ORDER — TRAMADOL-ACETAMINOPHEN 37.5-325 MG PO TABS
1.0000 | ORAL_TABLET | Freq: Four times a day (QID) | ORAL | Status: DC | PRN
  Administered 2017-03-18 – 2017-03-19 (×3): 1 via ORAL
  Filled 2017-03-16 (×3): qty 1

## 2017-03-16 MED ORDER — HYDROCORTISONE ACETATE 25 MG RE SUPP: 25.0000 mg | Freq: Two times a day (BID) | RECTAL | Status: DC | PRN

## 2017-03-16 MED ORDER — ACETAMINOPHEN 325 MG PO TABS: 650.0000 mg | ORAL_TABLET | ORAL | Status: DC | PRN

## 2017-03-16 MED ORDER — IOPAMIDOL (ISOVUE-370) INJECTION 76%
INTRAVENOUS | Status: AC
  Administered 2017-03-16: 80 mL
  Filled 2017-03-16: qty 100

## 2017-03-16 MED ORDER — ENOXAPARIN SODIUM 40 MG/0.4ML ~~LOC~~ SOLN
40.0000 mg | Freq: Every day | SUBCUTANEOUS | Status: DC
  Administered 2017-03-16: 40 mg via SUBCUTANEOUS
  Filled 2017-03-16: qty 0.4

## 2017-03-16 MED ORDER — POTASSIUM CHLORIDE CRYS ER 20 MEQ PO TBCR
40.0000 meq | EXTENDED_RELEASE_TABLET | Freq: Every day | ORAL | Status: DC
  Administered 2017-03-17: 40 meq via ORAL
  Filled 2017-03-16 (×2): qty 2

## 2017-03-16 MED ORDER — GABAPENTIN 100 MG PO CAPS
200.0000 mg | ORAL_CAPSULE | Freq: Three times a day (TID) | ORAL | Status: DC
  Administered 2017-03-16 – 2017-03-19 (×9): 200 mg via ORAL
  Filled 2017-03-16 (×9): qty 2

## 2017-03-16 MED ORDER — FUROSEMIDE 10 MG/ML IJ SOLN
40.0000 mg | Freq: Two times a day (BID) | INTRAMUSCULAR | Status: AC
  Administered 2017-03-16 – 2017-03-17 (×2): 40 mg via INTRAVENOUS
  Filled 2017-03-16 (×2): qty 4

## 2017-03-16 MED ORDER — CLOPIDOGREL BISULFATE 75 MG PO TABS
75.0000 mg | ORAL_TABLET | Freq: Every day | ORAL | Status: DC
  Administered 2017-03-17 – 2017-03-19 (×3): 75 mg via ORAL
  Filled 2017-03-16 (×3): qty 1

## 2017-03-16 MED ORDER — PREDNISOLONE ACETATE 1 % OP SUSP
1.0000 [drp] | Freq: Four times a day (QID) | OPHTHALMIC | Status: DC
  Administered 2017-03-17 – 2017-03-19 (×10): 1 [drp] via OPHTHALMIC
  Filled 2017-03-16: qty 1

## 2017-03-16 MED ORDER — VITAMIN B-12 1000 MCG PO TABS
2000.0000 ug | ORAL_TABLET | Freq: Every day | ORAL | Status: DC
  Administered 2017-03-17 – 2017-03-19 (×3): 2000 ug via ORAL
  Filled 2017-03-16 (×3): qty 2

## 2017-03-16 MED ORDER — FOLIC ACID 1 MG PO TABS
1.0000 mg | ORAL_TABLET | Freq: Every day | ORAL | Status: DC
  Administered 2017-03-17 – 2017-03-19 (×3): 1 mg via ORAL
  Filled 2017-03-16 (×3): qty 1

## 2017-03-16 MED ORDER — PANTOPRAZOLE SODIUM 40 MG PO TBEC
40.0000 mg | DELAYED_RELEASE_TABLET | Freq: Two times a day (BID) | ORAL | Status: DC
  Administered 2017-03-16 – 2017-03-19 (×6): 40 mg via ORAL
  Filled 2017-03-16 (×6): qty 1

## 2017-03-16 MED ORDER — GI COCKTAIL ~~LOC~~: 30.0000 mL | Freq: Four times a day (QID) | ORAL | Status: DC | PRN

## 2017-03-16 MED ORDER — ONDANSETRON HCL 4 MG/2ML IJ SOLN
4.0000 mg | Freq: Four times a day (QID) | INTRAMUSCULAR | Status: DC | PRN
  Administered 2017-03-16: 4 mg via INTRAVENOUS
  Filled 2017-03-16: qty 2

## 2017-03-16 MED ORDER — ALBUTEROL SULFATE (2.5 MG/3ML) 0.083% IN NEBU: 3.0000 mL | INHALATION_SOLUTION | Freq: Four times a day (QID) | RESPIRATORY_TRACT | Status: DC | PRN

## 2017-03-16 MED ORDER — ONDANSETRON HCL 4 MG PO TABS
4.0000 mg | ORAL_TABLET | Freq: Every day | ORAL | Status: DC
  Administered 2017-03-17 – 2017-03-19 (×3): 4 mg via ORAL
  Filled 2017-03-16 (×3): qty 1

## 2017-03-16 NOTE — ED Triage Notes (Signed)
Left sided chest pain on and off since Saturday that radiates to back. Pt endorses episodes of sob and dizziness but denies n/v/ diaphoresis.

## 2017-03-16 NOTE — ED Notes (Signed)
Patient transported to CT 

## 2017-03-16 NOTE — Progress Notes (Signed)
Subjective:    Patient ID: [name] [name], female    DOB: 1922/09/25, 83 y.o.   MRN: [id number]  HPI 02/10/17 Patient is a 83 year old female here today to establish care. Past medical history is significant for melanoma on the left thigh status post surgical excision. She also has a history of ductal carcinoma in situ of the breast status post surgical excision. Most recent mammogram was in March and was clear. She also has a history of iron deficiency anemia of chronic blood loss attributed to hemorrhoids. She receives iron infusions as she cannot tolerate oral iron. Her last iron infusion was earlier this summer or late spring. Her most recent hemoglobin was 12.8 in June. She will be due to recheck this in 3 months. She also has a history of vitamin B12 deficiency and has received 1000 g vitamin B12 injection weekly at the cancer center for one month. They recommended that she take 2000 g a day thereafter and then recheck a level. She also has a history of a mass on her right kidney that is 12 mm in size and is most consistent with renal cell carcinoma. According to the daughter, because of her advanced age, that have made the recommendation not to surgically remove this. This is being monitored at the present time. Overall patient is doing well. She is due for Prevnar 13.  At that time, my plan was: Patient is very sharp and seems to be in remarkable health for her age. Currently she is being monitored annually with a regular exam for recurrence of her melanoma or breast cancer. She is seen at the cancer center in [place]. I would like her to return in 3 months to repeat a CBC, ferritin level, as well as a vitamin B12 level. If her vitamin B12 is low at that time despite oral replacement, I would recommend monthly parenteral B12 injections. She cannot tolerate oral iron. Therefore her hemoglobin drops below 10, I would recommend a repeat iron infusion. Given her advanced age, I would recommend  against any surgical intervention on her ascending aortic aneurysm. At the present time she is asymptomatic from the aortic valve insufficiency. She denies any syncope or shortness of breath or chest pain. They have elected not to treat the mass on her right kidney. Therefore I would like to see her back in 3 months to repeat lab work including a CBC, CMP, TSH, iron level, vitamin B-12 level.  03/1317 Patient has a history of an ascending aortic aneurysm. This was last characterized on the CT scan of the chest in 2014. At that time it was 4.4 cm. Saturday she developed severe acute substernal chest pain that radiated into her back in between the shoulder blades. She continues to have moderate to severe pain radiating from the center of her chest to her back in between her shoulder blades. She denies any angina.  There is no relationship to exertion.  Pain is present at rest.  She denies any nausea or vomiting. She denies any diaphoresis. She denies any pain in her arm. She does report some shortness of breath but this is chronic and unchanged. She denies any falls or injuries. She does not remember any inciting event. She is slightly tender to palpation in the thoracic paraspinal muscles Past Medical History:  Diagnosis Date  . Aneurysm, thoracic aortic ([place])   . Aortic insufficiency   . Ascending aortic aneurysm ([place])    CT in 2012 - 4.2x4.2cm  . Bladder infection  h/o  . 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 Dehydration    HISTORY   . Fall at home 09/10/12  . Hemorrhoids   . Hip fracture ([place])    hip surgery 2001  . History of blood clots    in leg  . History of knee surgery   . Hypertension   . IDA (iron deficiency anemia) 06/07/2013  . Iron deficiency anemia, unspecified 03/14/2013   secondary to GI blood loss  . Kidney infection    h/o  . Melanoma of skin ([place]) 03/14/2013  . Melanoma of thigh ([place])    left  . Mini stroke ([place])   . S/P colonoscopy  March 2010   RMR: friable anal canal hemorrhoids, hyperplastic ascending polyp, adenomatous descending polyp   . Stomach ulcer    secondary to h.pylori, s/p treatment  . Thyroid condition   . Venous stasis    edema   Past Surgical History:  Procedure Laterality Date  . APPENDECTOMY  1942  . BREAST LUMPECTOMY  1998  . CATARACT EXTRACTION, BILATERAL    . CHOLECYSTECTOMY    . COLONOSCOPY  06/05/11   pancolonic diverticulosis/ileal reosion/abnormal anorectal junction s/p biopsy: path for small intestine and Ti was benign with non-villous atrophy, rectal biopsy with prominent prolapse changes, no acute inflammation  . CORNEAL TRANSPLANT Bilateral 2013  . ENTEROSCOPY N/A 06/13/2013   ACZ:YSAY Gastritis/GI BLEED MOST LIKELY DUE TO DUODENAL ULCERS, AND ? AVMs  . ESOPHAGOGASTRODUODENOSCOPY  06/05/11   small hiatal hernia; + H.PYLORI GASTRITIS, s/p 5 days of Pylera, unable to finish due to N/V  . ESOPHAGOGASTRODUODENOSCOPY N/A 06/13/2013   Dr. Oneida Alar- see enteroscopy  . ESOPHAGOGASTRODUODENOSCOPY (EGD) WITH ESOPHAGEAL DILATION N/A 06/08/2013   TKZ:SWFUX hiatal hernia;  otherwise normal EGD s/p dilation  . GIVENS CAPSULE STUDY N/A 06/08/2013   Procedure: GIVENS CAPSULE STUDY;  Surgeon: Daneil Dolin, MD;  Location: [facility];  Service: Endoscopy;  Laterality: N/A;  . HEMORRHOID BANDING    . KNEE SURGERY Right 05/2006   total right  . LEG SURGERY    . MELANOMA EXCISION Left 08/2006   left leg excision  . NM MYOCAR PERF WALL MOTION  03/27/2010   dipyridamole; small reversible basal to mid-septal defect (?artifact), post-stress EF 55%, low risk scan   . PARTIAL HYSTERECTOMY  1976  . TONSILLECTOMY    . TOTAL HIP ARTHROPLASTY Left 2001&2004    X 2 FOR LEFT HIP  . TRANSTHORACIC ECHOCARDIOGRAM  10/06/2012   EF 55-60%, mod eccentric hypertrophy, grade 2 diastolic dysfunction; mildly calcifed AV annulus with moderate regurg; aortic root mildly dilated; LA severely dailted; PA peak pressure 61mHg  .  VARICOSE VEIN SURGERY     Current Outpatient Prescriptions on File Prior to Visit  Medication Sig Dispense Refill  . clopidogrel (PLAVIX) 75 MG tablet Take 75 mg by mouth daily with breakfast.    . cyanocobalamin ([facility] VITAMIN B12) 2000 MCG tablet Take 1 tablet (2,000 mcg total) by mouth daily. 30 tablet 11  . folic acid (FOLVITE) 1 MG tablet Take 1 tablet (1 mg total) by mouth daily. 30 tablet 11  . furosemide (LASIX) 20 MG tablet Take 1 tablet (20 mg total) by mouth daily. 30 tablet 1  . gabapentin (NEURONTIN) 100 MG capsule TAKE 2 CAPSULES BY MOUTH 3  TIMES DAILY 540 capsule 1  . hydrocortisone (ANUSOL-HC) 25 MG suppository Place 1 suppository (25 mg total) rectally every 12 (twelve) hours. (Patient taking differently:  Place 25 mg rectally 2 (two) times daily as needed for hemorrhoids or itching. ) 12 suppository 1  . ondansetron (ZOFRAN) 4 MG tablet Take 1 tablet (4 mg total) by mouth every 8 (eight) hours as needed for nausea or vomiting. (Patient taking differently: Take 4 mg by mouth every morning. *MAY TAKE AS NEEDED DAILY BUT TAKES ONE TABLET IN THE MORNING) 30 tablet 0  . pantoprazole (PROTONIX) 40 MG tablet Take 1 tablet (40 mg total) by mouth 2 (two) times daily. 60 tablet 11  . potassium chloride SA (K-DUR,KLOR-CON) 20 MEQ tablet Take 2 tablets (40 mEq total) by mouth daily. (Patient not taking: Reported on 02/10/2017) 60 tablet 0  . prednisoLONE acetate (PRED FORTE) 1 % ophthalmic suspension Place 1 drop into both eyes 4 (four) times daily.     . prochlorperazine (COMPAZINE) 10 MG tablet Take 1 tablet (10 mg total) by mouth every 6 (six) hours as needed for nausea or vomiting. 45 tablet 3  . psyllium (METAMUCIL) 58.6 % packet Take 1 packet by mouth daily as needed (for constipation).     . sodium chloride (MURO 128) 2 % ophthalmic solution Place 1 drop into both eyes 3 (three) times daily.     . traMADol-acetaminophen (ULTRACET) 37.5-325 MG tablet Take 1 tablet by mouth every 6 (six)  hours as needed for moderate pain or severe pain. 180 tablet 2   No current facility-administered medications on file prior to visit.    Allergies  Allergen Reactions  . Codeine Diarrhea and Nausea Only  . Asa [Aspirin] Rash and Other (See Comments)    Petechiae   . Iron Nausea And Vomiting    Oral iron causes nausea and vomiting  . Penicillins Rash   Social History   Social History  . Marital status: Widowed    Spouse name: N/A  . Number of children: 5  . Years of education: 6   Occupational History  .  Retired   Social History Main Topics  . Smoking status: Former Smoker    Packs/day: 1.50    Years: 20.00    Types: Cigarettes    Quit date: 07/05/1975  . Smokeless tobacco: Never Used     Comment: Quit smoking in 1975  . Alcohol use No  . Drug use: No  . Sexual activity: No   Other Topics Concern  . Not on file   Social History Narrative  . No narrative on file   Family History  Problem Relation Age of Onset  . Breast cancer Daughter        also hyperlipidemia  . Breast cancer Daughter        also hyperlipidemia  . Asthma Mother   . Multiple sclerosis Child   . Hyperlipidemia Child   . Colon cancer Neg Hx      Review of Systems  All other systems reviewed and are negative.      Objective:   Physical Exam  Constitutional: She is oriented to person, place, and time. She appears well-developed and well-nourished. No distress.  HENT:  Right Ear: External ear normal.  Left Ear: External ear normal.  Nose: Nose normal.  Mouth/Throat: Oropharynx is clear and moist. No oropharyngeal exudate.  Eyes: Conjunctivae are normal.  Neck: Neck supple. No JVD present. No thyromegaly present.  Cardiovascular: Normal rate and regular rhythm.   Murmur heard. Pulmonary/Chest: Effort normal and breath sounds normal. No respiratory distress. She has no wheezes. She has no rales. She exhibits no tenderness.  Abdominal: Soft. Bowel sounds are normal. She exhibits no  distension and no mass. There is no tenderness. There is no rebound and no guarding.  Musculoskeletal: She exhibits edema.  Lymphadenopathy:    She has no cervical adenopathy.  Neurological: She is alert and oriented to person, place, and time. She displays normal reflexes. No cranial nerve deficit. She exhibits normal muscle tone. Coordination normal.  Skin: No rash noted. No erythema. No pallor.  Psychiatric: She has a normal mood and affect. Her behavior is normal. Judgment and thought content normal.  Vitals reviewed.         Assessment & Plan:  Atypical chest pain  Given her advanced age, and her history of an ascending aortic aneurysm last visualized 6 years ago, and the pattern of pain that she is having, I recommended that she go to the emergency room immediately for her chest pain. I am concerned about worsening of the aortic aneurysm.  At this time, the pain has been present for more than 48 hours. Her vital signs are stable. She is not short of breath. After discussion with the family, I do not believe that she requires EMS transport as the emergency room is 10 minutes away. Her daughter feels safe carrying her by private vehicle. We will notify the ER.

## 2017-03-16 NOTE — ED Provider Notes (Signed)
[facility] DEPT Provider Note   CSN: [id number] Arrival date & time: 03/16/17  1139     History   Chief Complaint Chief Complaint  Patient presents with  . Chest Pain    HPI [name] [name] is a 83 y.o. female.  HPI   82 yo F with h/o thoracic aortic aneurysm, HTN, HLD, h/o PE here with acute onset of CP and SOB. Pt reports sudden onset of severe chest pain 2 days ago, with sharp radiation to her back. Since then, her pain has come and gone. She has had associated SOB when pain is severe, with possible diaphoresis. She went to her PCP today for evaluation, at which time she was sent here for CT scan. She has current CP and back pain, as well as SOB. No leg swelling. No focal weakness or numbness.  Past Medical History:  Diagnosis Date  . Aneurysm, thoracic aortic ([place])   . Aortic insufficiency   . Ascending aortic aneurysm ([place])    CT in 2012 - 4.2x4.2cm  . Bladder infection    h/o  . 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 Dehydration    HISTORY   . Fall at home 09/10/12  . Hemorrhoids   . Hip fracture ([place])    hip surgery 2001  . History of blood clots    in leg  . History of knee surgery   . Hypertension   . IDA (iron deficiency anemia) 06/07/2013  . Iron deficiency anemia, unspecified 03/14/2013   secondary to GI blood loss  . Kidney infection    h/o  . Melanoma of skin ([place]) 03/14/2013  . Melanoma of thigh ([place])    left  . Mini stroke ([place])   . S/P colonoscopy March 2010   RMR: friable anal canal hemorrhoids, hyperplastic ascending polyp, adenomatous descending polyp   . Stomach ulcer    secondary to h.pylori, s/p treatment  . Thyroid condition   . Venous stasis    edema    Patient Active Problem List   Diagnosis Date Noted  . Chest pain 03/16/2017  . Elevated homocysteine ([place]) 01/26/2017  . Vitamin B12 deficiency 01/26/2017  . Dysphagia 05/20/2016  . Thrombocytopenia ([place]) 05/19/2016  . Acute respiratory failure  with hypoxia ([place]) 05/18/2016  . Acute bronchitis 05/18/2016  . Bilateral lower extremity edema 05/18/2016  . Degenerative joint disease involving multiple joints 05/18/2016  . Pyuria 05/18/2016  . DNR (do not resuscitate) 05/18/2016  . Constipation 02/09/2015  . Occult GI bleeding 08/23/2013  . IDA (iron deficiency anemia) 06/07/2013  . Melanoma of skin ([place]) 03/14/2013  . DCIS (ductal carcinoma in situ) of breast 03/14/2013  . BPPV (benign paroxysmal positional vertigo) 02/10/2013  . RBBB 02/10/2013  . Ascending aortic aneurysm ([place]) 02/10/2013  . Aortic insufficiency 02/10/2013  . S/P total knee replacement 08/14/2011  . Helicobacter pylori gastritis 07/14/2011  . Anemia 02/04/2011  . Bladder infection   . BURSITIS, HIP 05/29/2009    Past Surgical History:  Procedure Laterality Date  . APPENDECTOMY  1942  . BREAST LUMPECTOMY  1998  . CATARACT EXTRACTION, BILATERAL    . CHOLECYSTECTOMY    . COLONOSCOPY  06/05/11   pancolonic diverticulosis/ileal reosion/abnormal anorectal junction s/p biopsy: path for small intestine and Ti was benign with non-villous atrophy, rectal biopsy with prominent prolapse changes, no acute inflammation  . CORNEAL TRANSPLANT Bilateral 2013  . ENTEROSCOPY N/A 06/13/2013   EXH:BZJI Gastritis/GI BLEED MOST LIKELY DUE TO DUODENAL  ULCERS, AND ? AVMs  . ESOPHAGOGASTRODUODENOSCOPY  06/05/11   small hiatal hernia; + H.PYLORI GASTRITIS, s/p 5 days of Pylera, unable to finish due to N/V  . ESOPHAGOGASTRODUODENOSCOPY N/A 06/13/2013   Dr. Oneida Alar- see enteroscopy  . ESOPHAGOGASTRODUODENOSCOPY (EGD) WITH ESOPHAGEAL DILATION N/A 06/08/2013   OVF:IEPPI hiatal hernia;  otherwise normal EGD s/p dilation  . GIVENS CAPSULE STUDY N/A 06/08/2013   Procedure: GIVENS CAPSULE STUDY;  Surgeon: Daneil Dolin, MD;  Location: [facility]ITE;  Service: Endoscopy;  Laterality: N/A;  . HEMORRHOID BANDING    . KNEE SURGERY Right 05/2006   total right  . LEG SURGERY    . MELANOMA  EXCISION Left 08/2006   left leg excision  . NM MYOCAR PERF WALL MOTION  03/27/2010   dipyridamole; small reversible basal to mid-septal defect (?artifact), post-stress EF 55%, low risk scan   . PARTIAL HYSTERECTOMY  1976  . TONSILLECTOMY    . TOTAL HIP ARTHROPLASTY Left 2001&2004    X 2 FOR LEFT HIP  . TRANSTHORACIC ECHOCARDIOGRAM  10/06/2012   EF 55-60%, mod eccentric hypertrophy, grade 2 diastolic dysfunction; mildly calcifed AV annulus with moderate regurg; aortic root mildly dilated; LA severely dailted; PA peak pressure 85mHg  . VARICOSE VEIN SURGERY      OB History    No data available       Home Medications    Prior to Admission medications   Medication Sig Start Date End Date Taking? Authorizing Provider  albuterol (PROAIR HFA) 108 (90 Base) MCG/ACT inhaler Inhale 1-2 puffs into the lungs every 6 (six) hours as needed for wheezing or shortness of breath.   Yes [provider]  clopidogrel (PLAVIX) 75 MG tablet Take 75 mg by mouth daily with breakfast.   Yes [provider]  cyanocobalamin ([employer] VITAMIN B12) 2000 MCG tablet Take 1 tablet (2,000 mcg total) by mouth daily. 01/26/17  Yes Baird Cancer, PA-C  folic acid (FOLVITE) 1 MG tablet Take 1 tablet (1 mg total) by mouth daily. 01/26/17  Yes Kefalas, Manon Hilding, PA-C  furosemide (LASIX) 20 MG tablet Take 1 tablet (20 mg total) by mouth daily. 03/13/14  Yes Nat Christen, MD  gabapentin (NEURONTIN) 100 MG capsule TAKE 2 CAPSULES BY MOUTH 3  TIMES DAILY Patient taking differently: Take 200 mg by mouth three times a day 08/05/16  Yes Carole Civil, MD  hydrocortisone (ANUSOL-HC) 25 MG suppository Place 1 suppository (25 mg total) rectally every 12 (twelve) hours. Patient taking differently: Place 25 mg rectally 2 (two) times daily as needed for hemorrhoids or itching.  05/16/15  Yes Annitta Needs, NP  ondansetron (ZOFRAN) 4 MG tablet Take 1 tablet (4 mg total) by mouth every 8 (eight) hours as needed for nausea or  vomiting. Patient taking differently: Take 4 mg by mouth daily before breakfast.  09/13/13  Yes Mahala Menghini, PA-C  pantoprazole (PROTONIX) 40 MG tablet Take 1 tablet (40 mg total) by mouth 2 (two) times daily. 02/09/15  Yes Annitta Needs, NP  prednisoLONE acetate (PRED FORTE) 1 % ophthalmic suspension Place 1 drop into both eyes 4 (four) times daily.    Yes [provider]  psyllium (METAMUCIL) 58.6 % packet Take 1 packet by mouth daily as needed (for constipation).    Yes [provider]  sodium chloride (MURO 128) 2 % ophthalmic solution Place 1 drop into both eyes 3 (three) times daily.    Yes [provider]  traMADol-acetaminophen (ULTRACET) 37.5-325 MG  tablet Take 1 tablet by mouth every 6 (six) hours as needed for moderate pain or severe pain. 02/10/17  Yes [name], MD  erythromycin ophthalmic ointment as directed.  03/10/17   [provider]  potassium chloride SA (K-DUR,KLOR-CON) 20 MEQ tablet Take 2 tablets (40 mEq total) by mouth daily. Patient not taking: Reported on 03/16/2017 01/13/17   Baird Cancer, PA-C    Family History Family History  Problem Relation Age of Onset  . Breast cancer Daughter        also hyperlipidemia  . Breast cancer Daughter        also hyperlipidemia  . Asthma Mother   . Multiple sclerosis Child   . Hyperlipidemia Child   . Colon cancer Neg Hx     Social History Social History  Substance Use Topics  . Smoking status: Former Smoker    Packs/day: 1.50    Years: 20.00    Types: Cigarettes    Quit date: 07/05/1975  . Smokeless tobacco: Never Used     Comment: Quit smoking in 1975  . Alcohol use No     Allergies   Codeine; Tape; Asa [aspirin]; Iron; and Penicillins   Review of Systems Review of Systems  Constitutional: Positive for fatigue. Negative for chills and fever.  HENT: Negative for congestion and rhinorrhea.   Eyes: Negative for visual disturbance.  Respiratory: Positive for cough,  chest tightness and shortness of breath. Negative for wheezing.   Cardiovascular: Positive for chest pain. Negative for leg swelling.  Gastrointestinal: Negative for abdominal pain, diarrhea, nausea and vomiting.  Genitourinary: Negative for dysuria and flank pain.  Musculoskeletal: Positive for back pain. Negative for neck pain and neck stiffness.  Skin: Negative for rash and wound.  Allergic/Immunologic: Negative for immunocompromised state.  Neurological: Negative for syncope, weakness and headaches.  All other systems reviewed and are negative.    Physical Exam Updated Vital Signs BP (!) 162/60   Pulse 62   Temp 98.9 F (37.2 C) (Oral)   Resp 17   SpO2 94%   Physical Exam  Constitutional: She is oriented to person, place, and time. She appears well-developed and well-nourished. No distress.  HENT:  Head: Normocephalic and atraumatic.  Eyes: Conjunctivae are normal.  Neck: Neck supple.  Cardiovascular: Normal rate, regular rhythm and normal heart sounds.  Exam reveals no friction rub.   No murmur heard. Pulmonary/Chest: Effort normal and breath sounds normal. No respiratory distress. She has no wheezes. She has no rales.  Abdominal: She exhibits no distension.  Musculoskeletal: She exhibits no edema.  Neurological: She is alert and oriented to person, place, and time. She exhibits normal muscle tone.  Skin: Skin is warm. Capillary refill takes less than 2 seconds.  Psychiatric: She has a normal mood and affect.  Nursing note and vitals reviewed.    ED Treatments / Results  Labs (all labs ordered are listed, but only abnormal results are displayed) Labs Reviewed  BASIC METABOLIC PANEL - Abnormal; Notable for the following:       Result Value   Potassium 3.3 (*)    Glucose, Bld 104 (*)    Creatinine, Ser 1.09 (*)    Calcium 8.4 (*)    GFR calc non Af Amer 43 (*)    GFR calc Af Amer 49 (*)    All other components within normal limits  CBC  HEPATIC FUNCTION PANEL    BRAIN NATRIURETIC PEPTIDE  LIPASE, BLOOD  MAGNESIUM  I-STAT TROPONIN, ED  EKG  EKG Interpretation None       Radiology Dg Chest 2 View  Result Date: 03/16/2017 CLINICAL DATA:  83 year old female with chest pain and shortness of breath for 3 days. EXAM: CHEST  2 VIEW COMPARISON:  05/18/2016 FINDINGS: Cardiomediastinal silhouette is stable in size and configuration. Coarsened interstitial markings appear similar to prior examination. There is no focal airspace opacity, pleural effusion or pneumothorax. Chronic and degenerative osseous changes appear stable, including anterior wedge compression deformity of an upper vertebra. No acute osseous abnormality. IMPRESSION: 1. No active cardiopulmonary disease. 2. Stable cardiomegaly and coarsened interstitial markings. 3. Stable chronic and degenerative osseous changes. Electronically Signed   By: Kristopher Oppenheim M.D.   On: 03/16/2017 12:29   Ct Angio Chest/abd/pel For Dissection W And/or Wo Contrast  Result Date: 03/16/2017 CLINICAL DATA:  Chest pain radiating into back with some associated shortness of breath. EXAM: CT ANGIOGRAPHY CHEST, ABDOMEN AND PELVIS TECHNIQUE: Multidetector CT imaging through the chest, abdomen and pelvis was performed using the standard protocol during bolus administration of intravenous contrast. Multiplanar reconstructed images and MIPs were obtained and reviewed to evaluate the vascular anatomy. CONTRAST:  80 mL Isovue 370 IV COMPARISON:  CT of the abdomen and pelvis on 11/06/2015 FINDINGS: CTA CHEST FINDINGS Cardiovascular: There is stable dilatation of the ascending thoracic aorta which measures 4.6 cm in greatest diameter. The aorta measures approximately 4.3 cm at the level of the sinuses of Valsalva. The proximal arch measures 3.6 cm. The distal arch measures 3.0 cm. The descending thoracic aorta measures 2.6 cm. No evidence of aortic dissection or intramural hemorrhage. No evidence of penetrating ulcer disease. The  proximal great vessels show tortuosity without significant stenosis or aneurysmal disease. The heart is significantly enlarged. Calcified plaque is noted in the coronary tree in a 3 vessel distribution. There are some mild calcifications at the level of the aortic valve and mitral annulus. No pericardial fluid identified. Central pulmonary arteries are very prominent in caliber with the main pulmonary artery measuring approximately 4.3 cm in diameter. Findings are consistent with underlying pulmonary hypertension. Mediastinum/Nodes: No evidence of mediastinal, hilar or axillary lymphadenopathy. No mediastinal masses identified. No gross abnormalities involving the trachea or esophagus. Lungs/Pleura: Lungs demonstrate bilateral pulmonary scarring. No evidence of focal airspace consolidation, pulmonary edema or pleural effusions. No pulmonary masses or pneumothorax. Musculoskeletal: Bony structures are unremarkable. Review of the MIP images confirms the above findings. CTA ABDOMEN AND PELVIS FINDINGS VASCULAR Aorta: The abdominal aorta shows scattered plaque without evidence of aneurysm or dissection. Celiac: Origin stenosis of approximately 50%. SMA: Normally patent. Renals: Mild plaque at the origins of bilateral single renal arteries without evidence of stenosis. IMA: Normally patent. Inflow: Bilateral iliac arteries show moderate tortuosity and mild plaque without evidence of stenosis or aneurysmal disease. The common femoral arteries and femoral bifurcations are normally patent bilaterally. Review of the MIP images confirms the above findings. NON-VASCULAR Hepatobiliary: No focal liver abnormality is seen. Status post cholecystectomy. No biliary dilatation. Pancreas: Unremarkable. No pancreatic ductal dilatation or surrounding inflammatory changes. Spleen: Normal in size without focal abnormality. Adrenals/Urinary Tract: Small lateral interpolar cortical mass visualized on the arterial phase and measures  approximately 1.2 cm. This shows no significant growth since the prior study in 2017. Stable cyst of the contralateral left kidney. Adrenal glands appear unremarkable. The bladder is decompressed. Stomach/Bowel: Bowel shows no evidence of obstruction or inflammation. Diverticulosis of the sigmoid colon noted without evidence of acute diverticulitis. Lymphatic: No enlarged lymph nodes identified. Reproductive: Status  post hysterectomy. No adnexal masses. Other: No abdominal wall hernia or abnormality. No abdominopelvic ascites. Musculoskeletal: Bones are osteopenic. No fractures or lesions identified. Review of the MIP images confirms the above findings. IMPRESSION: 1. Stable aneurysmal disease of the ascending thoracic aorta which measures 4.6 cm in greatest diameter. There is no evidence of acute aortic dissection or other acute aortic syndrome. 2. Coronary atherosclerosis with calcified plaque in a 3 vessel distribution. 3. Significant cardiac enlargement without pulmonary edema. 4. Evidence of pulmonary hypertension with significant dilatation of the main pulmonary artery and central pulmonary arteries. 5. Stable roughly 1.2 cm right renal mass centered in the lateral interpolar cortex. This shows no significant growth since a prior CT on 11/06/2015. Electronically Signed   By: Aletta Edouard M.D.   On: 03/16/2017 14:25    Procedures Procedures (including critical care time)  Medications Ordered in ED Medications  iopamidol (ISOVUE-370) 76 % injection (80 mLs  Contrast Given 03/16/17 1344)  potassium chloride SA (K-DUR,KLOR-CON) CR tablet 40 mEq (40 mEq Oral Given 03/16/17 1636)     Initial Impression / Assessment and Plan / ED Course  I have reviewed the triage vital signs and the nursing notes.  Pertinent labs & imaging results that were available during my care of the patient were reviewed by me and considered in my medical decision making (see chart for details).     83 yo F here with acute  onset CP and back pain, intermittent since onset with associated SOB. EKG non-ischemic. Initial concern for aortic aneurysm/dissection so CT obtained, which shows no dissection or large PE but is c/f CAD, cardiomegaly, and possible pulmonary hypertension. Pt is noted to be mildly hypoxic in ED here as well. Will admit for new onset CP with underlying CAD, possible RV dysfunction. LIkely will need TTE, possibly diuresis.  Final Clinical Impressions(s) / ED Diagnoses   Final diagnoses:  Atypical chest pain  Pulmonary hypertension ([place])    New Prescriptions New Prescriptions   No medications on file     Duffy Bruce, MD 03/16/17 1652

## 2017-03-16 NOTE — H&P (Addendum)
History and Physical    [name] [name] TIW:[id number] DOB: Sep 08, 1922 DOA: 03/16/2017  Referring MD/NP/PA: Ellender Hose PCP: [name], MD   Patient coming from: apartment   Chief Complaint: chest and back pain  HPI: [name] [name] is a 83 y.o. 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  Patient admits to some memory problems and is vague when discussing timing of onset of symptoms.  She started having some middle of the back pains several weeks ago.  Separately, on Saturday, she had an episode of severe retrosternal chest pain that occurred while she was leaning over.  The pain was 9/10 and may have radiated to her back.  She had associated SOB and lightheadedness.  Pain was pleuritic. Symptoms lasted 3-5 minutes and resolved.  She states pain has recurred several more times but does not appear to be brought on by walking with her walker.  Pain occurred when she was turning around on the toilet to reach the toilet paper.  She has chronic swelling which initially she told me was worse than usual, but later she stated it was the same as usual.    ED Course: Vital signs stable except O2 dipped to 87% on room air and she was started on 2L [place].  Labs:  Troponin negative.  ECG:  A-fib with RBBB, no acute changes compared to 2017.  CXR with no acute disease, stable cardiomegaly and coursened interstitial markings.  CT c/a/p for aortic dissection demonstrated a 4.6cm ascending aortic aneurysm without evidence of dissection, 3 vessel coronary artery calcifications, cardiac enlargement and evidence of  pulmonary hypertension.  Her renal mass appears stable compared to CT 11/06/2015.    Review of Systems:  General:  Denies fevers, chills HEENT:  Hard of hearing.  denies sinus congestion, sore throat CV: per HPI  PULM:  Denies SOB, cough aside from episodes of chest pain GI:  Denies nausea, vomiting, constipation, diarrhea.   GU:  Denies dysuria, frequency, urgency ENDO:  Denies polyuria, polydipsia.   HEME:  Denies blood in stools, abnormal bruising or bleeding.  LYMPH:  Denies lymphadenopathy.   MSK:  Denies arthralgias, myalgias.   DERM:  Denies skin rash or ulcer.   NEURO:  Denies new focal numbness or weakness, slurred speech, confusion aside from memory loss PSYCH:  Denies anxiety and depression.    Past Medical History:  Diagnosis Date  . Aneurysm, thoracic aortic ([place])   . Aortic insufficiency   . Ascending aortic aneurysm ([place])    CT in 2012 - 4.2x4.2cm  . Atrial fibrillation ([place])   . Bladder infection    h/o  . 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 Dehydration    HISTORY   . Fall at home 09/10/12  . Hemorrhoids   . Hip fracture ([place])    hip surgery 2001  . History of blood clots    in leg  . History of knee surgery   . Hypertension   . IDA (iron deficiency anemia) 06/07/2013  . Iron deficiency anemia, unspecified 03/14/2013   secondary to GI blood loss  . Kidney infection  h/o  . Melanoma of skin ([place]) 03/14/2013  . Melanoma of thigh ([place])    left  . Mini stroke ([place])   . S/P colonoscopy March 2010   RMR: friable anal canal hemorrhoids, hyperplastic ascending polyp, adenomatous descending polyp   . Stomach ulcer    secondary to h.pylori, s/p treatment  . Thyroid condition   . Venous stasis    edema    Past Surgical History:  Procedure Laterality Date  . APPENDECTOMY  1942  . BREAST LUMPECTOMY  1998  . CATARACT EXTRACTION, BILATERAL    . CHOLECYSTECTOMY    . COLONOSCOPY  06/05/11   pancolonic diverticulosis/ileal  reosion/abnormal anorectal junction s/p biopsy: path for small intestine and Ti was benign with non-villous atrophy, rectal biopsy with prominent prolapse changes, no acute inflammation  . CORNEAL TRANSPLANT Bilateral 2013  . ENTEROSCOPY N/A 06/13/2013   QMG:QQPY Gastritis/GI BLEED MOST LIKELY DUE TO DUODENAL ULCERS, AND ? AVMs  . ESOPHAGOGASTRODUODENOSCOPY  06/05/11   small hiatal hernia; + H.PYLORI GASTRITIS, s/p 5 days of Pylera, unable to finish due to N/V  . ESOPHAGOGASTRODUODENOSCOPY N/A 06/13/2013   Dr. Oneida Alar- see enteroscopy  . ESOPHAGOGASTRODUODENOSCOPY (EGD) WITH ESOPHAGEAL DILATION N/A 06/08/2013   PPJ:KDTOI hiatal hernia;  otherwise normal EGD s/p dilation  . GIVENS CAPSULE STUDY N/A 06/08/2013   Procedure: GIVENS CAPSULE STUDY;  Surgeon: Daneil Dolin, MD;  Location: [facility];  Service: Endoscopy;  Laterality: N/A;  . HEMORRHOID BANDING    . KNEE SURGERY Right 05/2006   total right  . LEG SURGERY    . MELANOMA EXCISION Left 08/2006   left leg excision  . NM MYOCAR PERF WALL MOTION  03/27/2010   dipyridamole; small reversible basal to mid-septal defect (?artifact), post-stress EF 55%, low risk scan   . PARTIAL HYSTERECTOMY  1976  . TONSILLECTOMY    . TOTAL HIP ARTHROPLASTY Left 2001&2004    X 2 FOR LEFT HIP  . TRANSTHORACIC ECHOCARDIOGRAM  10/06/2012   EF 55-60%, mod eccentric hypertrophy, grade 2 diastolic dysfunction; mildly calcifed AV annulus with moderate regurg; aortic root mildly dilated; LA severely dailted; PA peak pressure 59mHg  . VARICOSE VEIN SURGERY       reports that she quit smoking about 41 years ago. Her smoking use included Cigarettes. She has a 30.00 pack-year smoking history. She has never used smokeless tobacco. She reports that she does not drink alcohol or use drugs.  Allergies  Allergen Reactions  . Codeine Diarrhea and Nausea Only  . Tape Other (See Comments)    SKIN IS VERY THIN AND TEARS AND BRUISES EASILY; PLEASE USE COBAN WRAP!!  . Diona Fanti  [Aspirin] Rash and Other (See Comments)    Petechia   . Iron Nausea And Vomiting    Oral iron causes nausea and vomiting  . Penicillins Rash    Has patient had a PCN reaction causing immediate rash, facial/tongue/throat swelling, SOB or lightheadedness with hypotension: Yes Has patient had a PCN reaction causing severe rash involving mucus membranes or skin necrosis: Unknown Has patient had a PCN reaction that required hospitalization: No Has patient had a PCN reaction occurring within the last 10 years: No If all of the above answers are "NO", then may proceed with Cephalosporin use.     Family History  Problem Relation Age of Onset  . Breast cancer Daughter        also hyperlipidemia  . Breast cancer Daughter        also hyperlipidemia  . Asthma  Mother   . Multiple sclerosis Child   . Hyperlipidemia Child   . Colon cancer Neg Hx     Prior to Admission medications   Medication Sig Start Date End Date Taking? Authorizing Provider  albuterol (PROAIR HFA) 108 (90 Base) MCG/ACT inhaler Inhale 1-2 puffs into the lungs every 6 (six) hours as needed for wheezing or shortness of breath.   Yes [provider]  clopidogrel (PLAVIX) 75 MG tablet Take 75 mg by mouth daily with breakfast.   Yes [provider]  cyanocobalamin ([employer] VITAMIN B12) 2000 MCG tablet Take 1 tablet (2,000 mcg total) by mouth daily. 01/26/17  Yes Baird Cancer, PA-C  folic acid (FOLVITE) 1 MG tablet Take 1 tablet (1 mg total) by mouth daily. 01/26/17  Yes Kefalas, Manon Hilding, PA-C  furosemide (LASIX) 20 MG tablet Take 1 tablet (20 mg total) by mouth daily. 03/13/14  Yes Nat Christen, MD  gabapentin (NEURONTIN) 100 MG capsule TAKE 2 CAPSULES BY MOUTH 3  TIMES DAILY Patient taking differently: Take 200 mg by mouth three times a day 08/05/16  Yes Carole Civil, MD  hydrocortisone (ANUSOL-HC) 25 MG suppository Place 1 suppository (25 mg total) rectally every 12 (twelve) hours. Patient taking  differently: Place 25 mg rectally 2 (two) times daily as needed for hemorrhoids or itching.  05/16/15  Yes Annitta Needs, NP  ondansetron (ZOFRAN) 4 MG tablet Take 1 tablet (4 mg total) by mouth every 8 (eight) hours as needed for nausea or vomiting. Patient taking differently: Take 4 mg by mouth daily before breakfast.  09/13/13  Yes Mahala Menghini, PA-C  pantoprazole (PROTONIX) 40 MG tablet Take 1 tablet (40 mg total) by mouth 2 (two) times daily. 02/09/15  Yes Annitta Needs, NP  prednisoLONE acetate (PRED FORTE) 1 % ophthalmic suspension Place 1 drop into both eyes 4 (four) times daily.    Yes [provider]  psyllium (METAMUCIL) 58.6 % packet Take 1 packet by mouth daily as needed (for constipation).    Yes [provider]  sodium chloride (MURO 128) 2 % ophthalmic solution Place 1 drop into both eyes 3 (three) times daily.    Yes [provider]  traMADol-acetaminophen (ULTRACET) 37.5-325 MG tablet Take 1 tablet by mouth every 6 (six) hours as needed for moderate pain or severe pain. 02/10/17  Yes [name], MD  erythromycin ophthalmic ointment as directed.  03/10/17   [provider]  potassium chloride SA (K-DUR,KLOR-CON) 20 MEQ tablet Take 2 tablets (40 mEq total) by mouth daily. Patient not taking: Reported on 03/16/2017 01/13/17   Baird Cancer, PA-C    Physical Exam: Vitals:   03/16/17 1324 03/16/17 1330 03/16/17 1515 03/16/17 1636  BP: (!) 133/55 (!) 130/48  (!) 162/60  Pulse: (!) 58 (!) 55 (!) 57 62  Resp: 18 16 15 17   Temp:      TempSrc:      SpO2: 91% 95% 94% 94%    Constitutional:  NAD, calm, comfortable Eyes: PERRL, lids and conjunctivae normal ENMT:  Moist mucous membranes.  Oropharynx nonerythematous, no exudates.   Neck:  No nuchal rigidity, no masses Respiratory:  Rales at the bilateral bases, no wheezes or rhonchi Cardiovascular: IRRR, abnormal or absent S2 with loud diastolic murmur.  1+ radial pulses.  No pain with  palpation along sternal borders.   Abdomen:  Normal active bowel sounds, soft, nondistended, nontender Musculoskeletal: Normal muscle tone and bulk.  No contractures.  2+ pitting  edema bilateral lower extremities Skin:  no rashes, abrasions, or ulcers Neurologic:  CN 2-12 grossly intact. Sensation intact to light touch, strength 5/5 throughout Psychiatric:  Alert and oriented x 3. Normal affect.   Labs on Admission: I have personally reviewed following labs and imaging studies  CBC:  Recent Labs Lab 03/16/17 1215  WBC 5.0  HGB 12.6  HCT 39.5  MCV 93.4  PLT 185   Basic Metabolic Panel:  Recent Labs Lab 03/16/17 1215  NA 141  K 3.3*  CL 103  CO2 29  GLUCOSE 104*  BUN 12  CREATININE 1.09*  CALCIUM 8.4*  MG 2.2   GFR: Estimated Creatinine Clearance: 34 mL/min (A) (by C-G formula based on SCr of 1.09 mg/dL (H)). Liver Function Tests: No results for input(s): AST, ALT, ALKPHOS, BILITOT, PROT, ALBUMIN in the last 168 hours.  Recent Labs Lab 03/16/17 1215  LIPASE 27   No results for input(s): AMMONIA in the last 168 hours. Coagulation Profile: No results for input(s): INR, PROTIME in the last 168 hours. Cardiac Enzymes: No results for input(s): CKTOTAL, CKMB, CKMBINDEX, TROPONINI in the last 168 hours. BNP (last 3 results) No results for input(s): PROBNP in the last 8760 hours. HbA1C: No results for input(s): HGBA1C in the last 72 hours. CBG: No results for input(s): GLUCAP in the last 168 hours. Lipid Profile: No results for input(s): CHOL, HDL, LDLCALC, TRIG, CHOLHDL, LDLDIRECT in the last 72 hours. Thyroid Function Tests: No results for input(s): TSH, T4TOTAL, FREET4, T3FREE, THYROIDAB in the last 72 hours. Anemia Panel: No results for input(s): VITAMINB12, FOLATE, FERRITIN, TIBC, IRON, RETICCTPCT in the last 72 hours. Urine analysis:    Component Value Date/Time   COLORURINE YELLOW 05/18/2016 1529   APPEARANCEUR CLEAR 05/18/2016 1529   LABSPEC 1.025  05/18/2016 1529   PHURINE 6.0 05/18/2016 1529   GLUCOSEU NEGATIVE 05/18/2016 1529   HGBUR LARGE (A) 05/18/2016 1529   BILIRUBINUR SMALL (A) 05/18/2016 1529   KETONESUR NEGATIVE 05/18/2016 1529   PROTEINUR TRACE (A) 05/18/2016 1529   UROBILINOGEN 1.0 12/18/2014 1735   NITRITE NEGATIVE 05/18/2016 1529   LEUKOCYTESUR MODERATE (A) 05/18/2016 1529   Sepsis Labs: @LABRCNTIP (procalcitonin:4,lacticidven:4) )No results found for this or any previous visit (from the past 240 hour(s)).   Radiological Exams on Admission: Dg Chest 2 View  Result Date: 03/16/2017 CLINICAL DATA:  83 year old female with chest pain and shortness of breath for 3 days. EXAM: CHEST  2 VIEW COMPARISON:  05/18/2016 FINDINGS: Cardiomediastinal silhouette is stable in size and configuration. Coarsened interstitial markings appear similar to prior examination. There is no focal airspace opacity, pleural effusion or pneumothorax. Chronic and degenerative osseous changes appear stable, including anterior wedge compression deformity of an upper vertebra. No acute osseous abnormality. IMPRESSION: 1. No active cardiopulmonary disease. 2. Stable cardiomegaly and coarsened interstitial markings. 3. Stable chronic and degenerative osseous changes. Electronically Signed   By: Kristopher Oppenheim M.D.   On: 03/16/2017 12:29   Ct Angio Chest/abd/pel For Dissection W And/or Wo Contrast  Result Date: 03/16/2017 CLINICAL DATA:  Chest pain radiating into back with some associated shortness of breath. EXAM: CT ANGIOGRAPHY CHEST, ABDOMEN AND PELVIS TECHNIQUE: Multidetector CT imaging through the chest, abdomen and pelvis was performed using the standard protocol during bolus administration of intravenous contrast. Multiplanar reconstructed images and MIPs were obtained and reviewed to evaluate the vascular anatomy. CONTRAST:  80 mL Isovue 370 IV COMPARISON:  CT of the abdomen and pelvis on 11/06/2015 FINDINGS: CTA CHEST FINDINGS Cardiovascular: There is  stable dilatation of the ascending thoracic aorta which measures 4.6 cm in greatest diameter. The aorta measures approximately 4.3 cm at the level of the sinuses of Valsalva. The proximal arch measures 3.6 cm. The distal arch measures 3.0 cm. The descending thoracic aorta measures 2.6 cm. No evidence of aortic dissection or intramural hemorrhage. No evidence of penetrating ulcer disease. The proximal great vessels show tortuosity without significant stenosis or aneurysmal disease. The heart is significantly enlarged. Calcified plaque is noted in the coronary tree in a 3 vessel distribution. There are some mild calcifications at the level of the aortic valve and mitral annulus. No pericardial fluid identified. Central pulmonary arteries are very prominent in caliber with the main pulmonary artery measuring approximately 4.3 cm in diameter. Findings are consistent with underlying pulmonary hypertension. Mediastinum/Nodes: No evidence of mediastinal, hilar or axillary lymphadenopathy. No mediastinal masses identified. No gross abnormalities involving the trachea or esophagus. Lungs/Pleura: Lungs demonstrate bilateral pulmonary scarring. No evidence of focal airspace consolidation, pulmonary edema or pleural effusions. No pulmonary masses or pneumothorax. Musculoskeletal: Bony structures are unremarkable. Review of the MIP images confirms the above findings. CTA ABDOMEN AND PELVIS FINDINGS VASCULAR Aorta: The abdominal aorta shows scattered plaque without evidence of aneurysm or dissection. Celiac: Origin stenosis of approximately 50%. SMA: Normally patent. Renals: Mild plaque at the origins of bilateral single renal arteries without evidence of stenosis. IMA: Normally patent. Inflow: Bilateral iliac arteries show moderate tortuosity and mild plaque without evidence of stenosis or aneurysmal disease. The common femoral arteries and femoral bifurcations are normally patent bilaterally. Review of the MIP images confirms  the above findings. NON-VASCULAR Hepatobiliary: No focal liver abnormality is seen. Status post cholecystectomy. No biliary dilatation. Pancreas: Unremarkable. No pancreatic ductal dilatation or surrounding inflammatory changes. Spleen: Normal in size without focal abnormality. Adrenals/Urinary Tract: Small lateral interpolar cortical mass visualized on the arterial phase and measures approximately 1.2 cm. This shows no significant growth since the prior study in 2017. Stable cyst of the contralateral left kidney. Adrenal glands appear unremarkable. The bladder is decompressed. Stomach/Bowel: Bowel shows no evidence of obstruction or inflammation. Diverticulosis of the sigmoid colon noted without evidence of acute diverticulitis. Lymphatic: No enlarged lymph nodes identified. Reproductive: Status post hysterectomy. No adnexal masses. Other: No abdominal wall hernia or abnormality. No abdominopelvic ascites. Musculoskeletal: Bones are osteopenic. No fractures or lesions identified. Review of the MIP images confirms the above findings. IMPRESSION: 1. Stable aneurysmal disease of the ascending thoracic aorta which measures 4.6 cm in greatest diameter. There is no evidence of acute aortic dissection or other acute aortic syndrome. 2. Coronary atherosclerosis with calcified plaque in a 3 vessel distribution. 3. Significant cardiac enlargement without pulmonary edema. 4. Evidence of pulmonary hypertension with significant dilatation of the main pulmonary artery and central pulmonary arteries. 5. Stable roughly 1.2 cm right renal mass centered in the lateral interpolar cortex. This shows no significant growth since a prior CT on 11/06/2015. Electronically Signed   By: Aletta Edouard M.D.   On: 03/16/2017 14:25    EKG: Independently reviewed.  Atrial fibrillation with RVR.  T-wave inversions in V3-V5 and inferior leads which appear stable compared to 2017.    Assessment/Plan Principal Problem:   Chest pain Active  Problems:   RBBB   Ascending aortic aneurysm [facility])   Aortic insufficiency   Atrial fibrillation, chronic (HCC)   Chest pain, HEART Score = 5.  Pain has some typical features (associated lightheadedness, SOB, lasts a few minutes) and some atypical features (brought on  by movement).  She has evidence of 3-vessel calcifications on CT and a reported history of HTN, HLD.  Differential includes ACS, GERD, MSK pain.  No evidence of dissection on CT c/a/p.   -  Telemetry -  Cycle troponins and ECG -  A1c & lipid panel -  Daily plavix -  NTG prn chest pain -  ECHO -  Maalox prn  Lower extremity edema with pulmonary artery dilation and known AI.  Although CXR does not demonstrated edema, may have some acute on chronic diastolic or systolic heart failure contributing to her symptoms -  F/u BNP -  Daily weights, strict I/O -  Lasix 40mg  IV BID -  Hold oral lasix -  Potassium supplementation ordered  Ascending aortic aneurysm 4.6cm, not a good surgical candidate  Iron and vitamin b12 deficiency anemia -  Continue outpatinet IV iron infusion -  Continue oral vitamin b12  Chronic atrial fibrillation, CHADs2vasc = 5  Should be on A/C, but may not be due to her chornic iron deficiency anemia from GI losses (hemorrhoids) which requires frequent IV iron infusions -  Defer anticoagulation for now pending work up for ACS, rate controlled  History of breast cancer and melanoma in remission and known right renal cell mass that appears stable.    DVT prophylaxis: lovenox  Code Status: DNR Family Communication:  Patient alone Disposition Plan: back to her ALF  Consults called: none  Admission status: observation/telemetry.  At risk for acute decompensation given age, evidence of CAD on CT, baseline arrhythmia, possibility of ACS.     [name] Canterbury MD [facility] Pager [phone]  If 7PM-7AM, please contact night-coverage [email or web address] Password [facility]  03/16/2017, 5:48 PM

## 2017-03-16 NOTE — ED Notes (Signed)
Pt O2 dropped to 79, applied 2L of O2.

## 2017-03-16 NOTE — ED Notes (Addendum)
Pt was sent from [facility] family practice for further evaluation of chest pain and peripheral edema that developed over this past weekend; history of aortic aneurysm

## 2017-03-17 ENCOUNTER — Encounter (HOSPITAL_COMMUNITY): Payer: Self-pay | Admitting: General Practice

## 2017-03-17 ENCOUNTER — Observation Stay (HOSPITAL_BASED_OUTPATIENT_CLINIC_OR_DEPARTMENT_OTHER): Payer: Medicare Other

## 2017-03-17 DIAGNOSIS — I4510 Unspecified right bundle-branch block: Secondary | ICD-10-CM | POA: Diagnosis present

## 2017-03-17 DIAGNOSIS — I482 Chronic atrial fibrillation: Secondary | ICD-10-CM | POA: Diagnosis not present

## 2017-03-17 DIAGNOSIS — I214 Non-ST elevation (NSTEMI) myocardial infarction: Secondary | ICD-10-CM | POA: Diagnosis not present

## 2017-03-17 DIAGNOSIS — Z8673 Personal history of transient ischemic attack (TIA), and cerebral infarction without residual deficits: Secondary | ICD-10-CM | POA: Diagnosis not present

## 2017-03-17 DIAGNOSIS — I480 Paroxysmal atrial fibrillation: Secondary | ICD-10-CM | POA: Diagnosis present

## 2017-03-17 DIAGNOSIS — I10 Essential (primary) hypertension: Secondary | ICD-10-CM | POA: Diagnosis present

## 2017-03-17 DIAGNOSIS — D509 Iron deficiency anemia, unspecified: Secondary | ICD-10-CM | POA: Diagnosis present

## 2017-03-17 DIAGNOSIS — D519 Vitamin B12 deficiency anemia, unspecified: Secondary | ICD-10-CM | POA: Diagnosis present

## 2017-03-17 DIAGNOSIS — I5181 Takotsubo syndrome: Secondary | ICD-10-CM | POA: Diagnosis not present

## 2017-03-17 DIAGNOSIS — Z7902 Long term (current) use of antithrombotics/antiplatelets: Secondary | ICD-10-CM | POA: Diagnosis not present

## 2017-03-17 DIAGNOSIS — R0789 Other chest pain: Secondary | ICD-10-CM | POA: Diagnosis not present

## 2017-03-17 DIAGNOSIS — C641 Malignant neoplasm of right kidney, except renal pelvis: Secondary | ICD-10-CM | POA: Diagnosis present

## 2017-03-17 DIAGNOSIS — N179 Acute kidney failure, unspecified: Secondary | ICD-10-CM | POA: Diagnosis present

## 2017-03-17 DIAGNOSIS — I361 Nonrheumatic tricuspid (valve) insufficiency: Secondary | ICD-10-CM

## 2017-03-17 DIAGNOSIS — R600 Localized edema: Secondary | ICD-10-CM | POA: Diagnosis not present

## 2017-03-17 DIAGNOSIS — R079 Chest pain, unspecified: Secondary | ICD-10-CM | POA: Diagnosis not present

## 2017-03-17 DIAGNOSIS — I712 Thoracic aortic aneurysm, without rupture: Secondary | ICD-10-CM | POA: Diagnosis not present

## 2017-03-17 DIAGNOSIS — Z66 Do not resuscitate: Secondary | ICD-10-CM | POA: Diagnosis present

## 2017-03-17 DIAGNOSIS — H9190 Unspecified hearing loss, unspecified ear: Secondary | ICD-10-CM | POA: Diagnosis present

## 2017-03-17 DIAGNOSIS — Z8679 Personal history of other diseases of the circulatory system: Secondary | ICD-10-CM | POA: Diagnosis not present

## 2017-03-17 DIAGNOSIS — Z85820 Personal history of malignant melanoma of skin: Secondary | ICD-10-CM | POA: Diagnosis not present

## 2017-03-17 DIAGNOSIS — I272 Pulmonary hypertension, unspecified: Secondary | ICD-10-CM | POA: Diagnosis not present

## 2017-03-17 DIAGNOSIS — E876 Hypokalemia: Secondary | ICD-10-CM | POA: Diagnosis not present

## 2017-03-17 DIAGNOSIS — E860 Dehydration: Secondary | ICD-10-CM | POA: Diagnosis present

## 2017-03-17 DIAGNOSIS — I519 Heart disease, unspecified: Secondary | ICD-10-CM | POA: Diagnosis not present

## 2017-03-17 DIAGNOSIS — K649 Unspecified hemorrhoids: Secondary | ICD-10-CM | POA: Diagnosis present

## 2017-03-17 DIAGNOSIS — Z853 Personal history of malignant neoplasm of breast: Secondary | ICD-10-CM | POA: Diagnosis not present

## 2017-03-17 DIAGNOSIS — I351 Nonrheumatic aortic (valve) insufficiency: Secondary | ICD-10-CM | POA: Diagnosis not present

## 2017-03-17 DIAGNOSIS — E538 Deficiency of other specified B group vitamins: Secondary | ICD-10-CM | POA: Diagnosis present

## 2017-03-17 DIAGNOSIS — E785 Hyperlipidemia, unspecified: Secondary | ICD-10-CM | POA: Diagnosis present

## 2017-03-17 LAB — ECHOCARDIOGRAM COMPLETE
AO mean calculated velocity dopler: 131 cm/s
AV Mean grad: 9 mmHg
AVCELMEANRAT: 0.5
AVPG: 18 mmHg
AVPKVEL: 214 cm/s
Ao pk vel: 0.49 m/s
Ao-asc: 40 cm
CHL CUP DOP CALC LVOT VTI: 23 cm
CHL CUP MV DEC (S): 215
CHL CUP TV REG PEAK VELOCITY: 314 cm/s
EWDT: 215 ms
FS: 29 % (ref 28–44)
IV/PV OW: 1.07
LA diam end sys: 50 mm
LA vol: 89.7 mL
LADIAMINDEX: 2.66 cm/m2
LASIZE: 50 mm
LAVOLA4C: 88.8 mL
LAVOLIN: 47.7 mL/m2
LVOTPV: 105 cm/s
LVOTVTI: 0.58 cm
MV Peak grad: 3 mmHg
MV pk E vel: 90.2 m/s
MVPKAVEL: 34.4 m/s
P 1/2 time: 535 ms
PW: 10.1 mm — AB (ref 0.6–1.1)
TAPSE: 15.1 mm
TRMAXVEL: 314 cm/s
VTI: 39.5 cm
Weight: 2670.21 oz

## 2017-03-17 LAB — BASIC METABOLIC PANEL
ANION GAP: 10 (ref 5–15)
BUN: 12 mg/dL (ref 6–20)
CHLORIDE: 100 mmol/L — AB (ref 101–111)
CO2: 31 mmol/L (ref 22–32)
Calcium: 8.5 mg/dL — ABNORMAL LOW (ref 8.9–10.3)
Creatinine, Ser: 1.11 mg/dL — ABNORMAL HIGH (ref 0.44–1.00)
GFR calc non Af Amer: 42 mL/min — ABNORMAL LOW (ref >60)
GFR, EST AFRICAN AMERICAN: 48 mL/min — AB (ref >60)
GLUCOSE: 100 mg/dL — AB (ref 65–99)
POTASSIUM: 3.3 mmol/L — AB (ref 3.5–5.1)
Sodium: 141 mmol/L (ref 135–145)

## 2017-03-17 LAB — TROPONIN I
TROPONIN I: 0.47 ng/mL — AB (ref <0.03)
Troponin I: 0.64 ng/mL (ref <0.03)
Troponin I: 0.66 ng/mL (ref <0.03)

## 2017-03-17 LAB — HEPARIN LEVEL (UNFRACTIONATED): HEPARIN UNFRACTIONATED: 0.32 [IU]/mL (ref 0.30–0.70)

## 2017-03-17 LAB — LIPID PANEL
CHOLESTEROL: 189 mg/dL (ref 0–200)
HDL: 55 mg/dL (ref >40)
LDL Cholesterol: 119 mg/dL — ABNORMAL HIGH (ref 0–99)
TRIGLYCERIDES: 73 mg/dL (ref <150)
Total CHOL/HDL Ratio: 3.4 RATIO
VLDL: 15 mg/dL (ref 0–40)

## 2017-03-17 LAB — MAGNESIUM: Magnesium: 2.1 mg/dL (ref 1.7–2.4)

## 2017-03-17 MED ORDER — CARVEDILOL 3.125 MG PO TABS
3.1250 mg | ORAL_TABLET | Freq: Two times a day (BID) | ORAL | Status: DC
  Administered 2017-03-18 – 2017-03-19 (×4): 3.125 mg via ORAL
  Filled 2017-03-17 (×4): qty 1

## 2017-03-17 MED ORDER — POTASSIUM CHLORIDE CRYS ER 20 MEQ PO TBCR
40.0000 meq | EXTENDED_RELEASE_TABLET | Freq: Two times a day (BID) | ORAL | Status: DC
  Administered 2017-03-17 – 2017-03-19 (×4): 40 meq via ORAL
  Filled 2017-03-17 (×4): qty 2

## 2017-03-17 MED ORDER — HEPARIN BOLUS VIA INFUSION
3000.0000 [IU] | Freq: Once | INTRAVENOUS | Status: AC
  Administered 2017-03-17: 3000 [IU] via INTRAVENOUS
  Filled 2017-03-17: qty 3000

## 2017-03-17 MED ORDER — HEPARIN (PORCINE) IN NACL 100-0.45 UNIT/ML-% IJ SOLN
1000.0000 [IU]/h | INTRAMUSCULAR | Status: DC
  Administered 2017-03-17: 900 [IU]/h via INTRAVENOUS
  Filled 2017-03-17 (×2): qty 250

## 2017-03-17 MED ORDER — FUROSEMIDE 10 MG/ML IJ SOLN
20.0000 mg | Freq: Two times a day (BID) | INTRAMUSCULAR | Status: DC
  Administered 2017-03-17 – 2017-03-18 (×2): 20 mg via INTRAVENOUS
  Filled 2017-03-17 (×2): qty 2

## 2017-03-17 NOTE — Progress Notes (Signed)
PROGRESS NOTE [facility]   DARREL BARONI   QQV:[id number] DOB: May 16, 1923  DOA: 03/16/2017 PCP: [name], MD   Brief Narrative:  [name] [name] 83 year old female with medical history AAA with aortic valve insufficiency, chronic A. fib not on anticoagulation, hypertension, and multiple cancers including breast, melanoma and RCC. Patient presented complaining of chest and back pain. Per daughter patient started to complain of the symptoms 2 days ago started on bilateral sides and radiated to her back and pain was pleuritic in origin. Patient also had some difficulty breathing which require oxygen. On ED evaluation her O2 sat dropped to 87%, her troponins were mildly elevated and EKG shows A. fib with right bundle branch block, although pneumonias changes compared to prior. CTA of chest, abdomen and pelvis showed 4.6 ascending aortic aneurysm without evidence of dissection, three-vessel coronary calcification cardiac enlargement and pulmonary hypertension. Patient was admitted for chest pain workup. Cardiology was consulted  Subjective: Patient seen and examined, she denies chest pain at this time. Although some difficulty breathing. Otherwise no concern. No acute events overnight. Troponins with mild transient.  Assessment & Plan: Chest pain rule out ACS HEART score 5, mix of typical and atypical features. She has evidence of three-vessel calcification on CT, CT scan done is not optimal for PE studies, case was discussed with IR who does not see any major PE's, but unable to rule out peripheral pulmonary embolisms - doubt PE Mild troponin trending 0.47 -> 0.64 -> 0.66 No significant EKG changes, A. fib and right bundle branch block Echocardiogram pending Cardiology consulted input appreciated  Grade 2 diastolic dysfunction with lower extremity edema Symptoms may be attributed to acute diastolic dysfunction. Patient with crackles at the bases and 2+ pitting edema We'll check  BNP, follow-up echo Will continue Lasix for now as patient shows good UOP, monitor kidney function while she is on Lasix F/u card recommendations   Ascending aortic aneurysm CT shows 4.6 cm aneurysm Patient is not a good candidate for surgery, will defer for evaluation as an outpatient.  Iron and vitamin B12 deficiency Continue home medications  Chronic A. Fib CHADSVASCs 5, patient not on anticoagulation, likely due to chronic iron deficiency anemia which required frequent IV iron infusions Heart rate is well controlled Defer anticoagulation to cardiology  DVT prophylaxis: Lovenox  Code Status: DNR Family Communication: Daughter at bedside  Disposition Plan: Home when medically stable  Consultants:   Cardiology   Procedures:   ECHO pending   Antimicrobials:  None    Objective: Vitals:   03/16/17 2326 03/17/17 0423 03/17/17 0826 03/17/17 0923  BP: (!) 141/77 110/60 (!) 122/53 (!) 104/48  Pulse: (!) 107 70 68 70  Resp: 18 (!) 22 (!) 23 (!) 21  Temp: 97.9 F (36.6 C) 97.8 F (36.6 C)  98 F (36.7 C)  TempSrc: Oral Oral  Oral  SpO2: 97% 97% 91% 93%  Weight:  75.7 kg (166 lb 14.2 oz)      Intake/Output Summary (Last 24 hours) at 03/17/17 0930 Last data filed at 03/17/17 0929  Gross per 24 hour  Intake                0 ml  Output              554 ml  Net             -554 ml   Filed Weights   03/17/17 0423  Weight: 75.7 kg (166 lb 14.2 oz)  Examination:  General exam: Appears calm and comfortable  HEENT:  OP moist and clear, nasal cannula 2 L  Respiratory system: Good air entry, bilateral crackles, no wheezing or rhonchi  Cardiovascular system:S1-S2, irregular irregular, no JVD, soft systolic murmur best heard at the LSB.  Gastrointestinal system: Abdomen is nondistended, soft and nontender.  Central nervous system: Alert and oriented, following commands  Extremities: 2+ pitting LE edema bilaterally  Skin: No rashes Psychiatry: Mood & affect  appropriate.    Data Reviewed: I have personally reviewed following labs and imaging studies  CBC:  Recent Labs Lab 03/16/17 1215  WBC 5.0  HGB 12.6  HCT 39.5  MCV 93.4  PLT 945   Basic Metabolic Panel:  Recent Labs Lab 03/16/17 1215  NA 141  K 3.3*  CL 103  CO2 29  GLUCOSE 104*  BUN 12  CREATININE 1.09*  CALCIUM 8.4*  MG 2.2   GFR: Estimated Creatinine Clearance: 33.5 mL/min (A) (by C-G formula based on SCr of 1.09 mg/dL (H)). Liver Function Tests:  Recent Labs Lab 03/16/17 1215  AST 25  ALT 10*  ALKPHOS 62  BILITOT 1.2  PROT 6.6  ALBUMIN 3.7    Recent Labs Lab 03/16/17 1215  LIPASE 27   No results for input(s): AMMONIA in the last 168 hours. Coagulation Profile: No results for input(s): INR, PROTIME in the last 168 hours. Cardiac Enzymes:  Recent Labs Lab 03/16/17 2343 03/17/17 0150 03/17/17 0406  TROPONINI 0.47* 0.64* 0.66*   BNP (last 3 results) No results for input(s): PROBNP in the last 8760 hours. HbA1C: No results for input(s): HGBA1C in the last 72 hours. CBG: No results for input(s): GLUCAP in the last 168 hours. Lipid Profile:  Recent Labs  03/17/17 0150  CHOL 189  HDL 55  LDLCALC 119*  TRIG 73  CHOLHDL 3.4   Thyroid Function Tests: No results for input(s): TSH, T4TOTAL, FREET4, T3FREE, THYROIDAB in the last 72 hours. Anemia Panel: No results for input(s): VITAMINB12, FOLATE, FERRITIN, TIBC, IRON, RETICCTPCT in the last 72 hours. Sepsis Labs: No results for input(s): PROCALCITON, LATICACIDVEN in the last 168 hours.  No results found for this or any previous visit (from the past 240 hour(s)).     Radiology Studies: Dg Chest 2 View  Result Date: 03/16/2017 CLINICAL DATA:  83 year old female with chest pain and shortness of breath for 3 days. EXAM: CHEST  2 VIEW COMPARISON:  05/18/2016 FINDINGS: Cardiomediastinal silhouette is stable in size and configuration. Coarsened interstitial markings appear similar to prior  examination. There is no focal airspace opacity, pleural effusion or pneumothorax. Chronic and degenerative osseous changes appear stable, including anterior wedge compression deformity of an upper vertebra. No acute osseous abnormality. IMPRESSION: 1. No active cardiopulmonary disease. 2. Stable cardiomegaly and coarsened interstitial markings. 3. Stable chronic and degenerative osseous changes. Electronically Signed   By: Kristopher Oppenheim M.D.   On: 03/16/2017 12:29   Ct Angio Chest/abd/pel For Dissection W And/or Wo Contrast  Result Date: 03/16/2017 CLINICAL DATA:  Chest pain radiating into back with some associated shortness of breath. EXAM: CT ANGIOGRAPHY CHEST, ABDOMEN AND PELVIS TECHNIQUE: Multidetector CT imaging through the chest, abdomen and pelvis was performed using the standard protocol during bolus administration of intravenous contrast. Multiplanar reconstructed images and MIPs were obtained and reviewed to evaluate the vascular anatomy. CONTRAST:  80 mL Isovue 370 IV COMPARISON:  CT of the abdomen and pelvis on 11/06/2015 FINDINGS: CTA CHEST FINDINGS Cardiovascular: There is stable dilatation of the ascending  thoracic aorta which measures 4.6 cm in greatest diameter. The aorta measures approximately 4.3 cm at the level of the sinuses of Valsalva. The proximal arch measures 3.6 cm. The distal arch measures 3.0 cm. The descending thoracic aorta measures 2.6 cm. No evidence of aortic dissection or intramural hemorrhage. No evidence of penetrating ulcer disease. The proximal great vessels show tortuosity without significant stenosis or aneurysmal disease. The heart is significantly enlarged. Calcified plaque is noted in the coronary tree in a 3 vessel distribution. There are some mild calcifications at the level of the aortic valve and mitral annulus. No pericardial fluid identified. Central pulmonary arteries are very prominent in caliber with the main pulmonary artery measuring approximately 4.3 cm  in diameter. Findings are consistent with underlying pulmonary hypertension. Mediastinum/Nodes: No evidence of mediastinal, hilar or axillary lymphadenopathy. No mediastinal masses identified. No gross abnormalities involving the trachea or esophagus. Lungs/Pleura: Lungs demonstrate bilateral pulmonary scarring. No evidence of focal airspace consolidation, pulmonary edema or pleural effusions. No pulmonary masses or pneumothorax. Musculoskeletal: Bony structures are unremarkable. Review of the MIP images confirms the above findings. CTA ABDOMEN AND PELVIS FINDINGS VASCULAR Aorta: The abdominal aorta shows scattered plaque without evidence of aneurysm or dissection. Celiac: Origin stenosis of approximately 50%. SMA: Normally patent. Renals: Mild plaque at the origins of bilateral single renal arteries without evidence of stenosis. IMA: Normally patent. Inflow: Bilateral iliac arteries show moderate tortuosity and mild plaque without evidence of stenosis or aneurysmal disease. The common femoral arteries and femoral bifurcations are normally patent bilaterally. Review of the MIP images confirms the above findings. NON-VASCULAR Hepatobiliary: No focal liver abnormality is seen. Status post cholecystectomy. No biliary dilatation. Pancreas: Unremarkable. No pancreatic ductal dilatation or surrounding inflammatory changes. Spleen: Normal in size without focal abnormality. Adrenals/Urinary Tract: Small lateral interpolar cortical mass visualized on the arterial phase and measures approximately 1.2 cm. This shows no significant growth since the prior study in 2017. Stable cyst of the contralateral left kidney. Adrenal glands appear unremarkable. The bladder is decompressed. Stomach/Bowel: Bowel shows no evidence of obstruction or inflammation. Diverticulosis of the sigmoid colon noted without evidence of acute diverticulitis. Lymphatic: No enlarged lymph nodes identified. Reproductive: Status post hysterectomy. No adnexal  masses. Other: No abdominal wall hernia or abnormality. No abdominopelvic ascites. Musculoskeletal: Bones are osteopenic. No fractures or lesions identified. Review of the MIP images confirms the above findings. IMPRESSION: 1. Stable aneurysmal disease of the ascending thoracic aorta which measures 4.6 cm in greatest diameter. There is no evidence of acute aortic dissection or other acute aortic syndrome. 2. Coronary atherosclerosis with calcified plaque in a 3 vessel distribution. 3. Significant cardiac enlargement without pulmonary edema. 4. Evidence of pulmonary hypertension with significant dilatation of the main pulmonary artery and central pulmonary arteries. 5. Stable roughly 1.2 cm right renal mass centered in the lateral interpolar cortex. This shows no significant growth since a prior CT on 11/06/2015. Electronically Signed   By: Aletta Edouard M.D.   On: 03/16/2017 14:25      Scheduled Meds: . clopidogrel  75 mg Oral Q breakfast  . enoxaparin (LOVENOX) injection  40 mg Subcutaneous QHS  . folic acid  1 mg Oral Daily  . gabapentin  200 mg Oral TID  . ondansetron  4 mg Oral QAC breakfast  . pantoprazole  40 mg Oral BID  . potassium chloride SA  40 mEq Oral Daily  . prednisoLONE acetate  1 drop Both Eyes QID  . sodium chloride  1 drop Both Eyes  TID  . cyanocobalamin  2,000 mcg Oral Daily   Continuous Infusions:   LOS: 0 days    Time spent: Total of 25 minutes spent with pt, greater than 50% of which was spent in discussion of  treatment, counseling and coordination of care    Chipper Oman, MD Pager: Text Page via [email or web address]  [phone]  If 7PM-7AM, please contact night-coverage [email or web address] Password [email or web address] 03/17/2017, 9:30 AM

## 2017-03-17 NOTE — Progress Notes (Signed)
ANTICOAGULATION CONSULT NOTE - Initial Consult  Pharmacy Consult for heparin  Indication: chest pain/ACS  Allergies  Allergen Reactions  . Codeine Diarrhea and Nausea Only  . Tape Other (See Comments)    SKIN IS VERY THIN AND TEARS AND BRUISES EASILY; PLEASE USE COBAN WRAP!!  . Diona Fanti [Aspirin] Rash and Other (See Comments)    Petechia   . Iron Nausea And Vomiting    Oral iron causes nausea and vomiting  . Penicillins Rash    Has patient had a PCN reaction causing immediate rash, facial/tongue/throat swelling, SOB or lightheadedness with hypotension: Yes Has patient had a PCN reaction causing severe rash involving mucus membranes or skin necrosis: Unknown Has patient had a PCN reaction that required hospitalization: No Has patient had a PCN reaction occurring within the last 10 years: No If all of the above answers are "NO", then may proceed with Cephalosporin use.     Patient Measurements: Weight: 166 lb 14.2 oz (75.7 kg)  Vital Signs: Temp: 98 F (36.7 C) (08/14 0923) Temp Source: Oral (08/14 0923) BP: 104/48 (08/14 0923) Pulse Rate: 70 (08/14 0923)  Labs:  Recent Labs  03/16/17 1215 03/16/17 2343 03/17/17 0150 03/17/17 0406  HGB 12.6  --   --   --   HCT 39.5  --   --   --   PLT 160  --   --   --   CREATININE 1.09*  --   --   --   TROPONINI  --  0.47* 0.64* 0.66*    Estimated Creatinine Clearance: 33.5 mL/min (A) (by C-G formula based on SCr of 1.09 mg/dL (H)).   Medical History: Past Medical History:  Diagnosis Date  . Aneurysm of aorta (H[facility])   . Aneurysm, thoracic aortic ([place])   . Aortic insufficiency   . Ascending aortic aneurysm ([place])    CT in 2012 - 4.2x4.2cm  . Atrial fibrillation ([place])   . Bladder infection    h/o  . 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 Dehydration    HISTORY   . Fall at home 09/10/12  . Hemorrhoids   . Hip fracture ([place])    hip surgery 2001  . History of blood clots    in leg  . History of  knee surgery   . Hypertension   . IDA (iron deficiency anemia) 06/07/2013  . Iron deficiency anemia, unspecified 03/14/2013   secondary to GI blood loss  . Kidney infection    h/o  . Melanoma of skin ([place]) 03/14/2013  . Melanoma of thigh ([place])    left  . Mini stroke ([place])   . S/P colonoscopy March 2010   RMR: friable anal canal hemorrhoids, hyperplastic ascending polyp, adenomatous descending polyp   . Stomach ulcer    secondary to h.pylori, s/p treatment  . Thyroid condition   . Venous stasis    edema    Medications:  Prescriptions Prior to Admission  Medication Sig Dispense Refill Last Dose  . albuterol (PROAIR HFA) 108 (90 Base) MCG/ACT inhaler Inhale 1-2 puffs into the lungs every 6 (six) hours as needed for wheezing or shortness of breath.   Past Week at Unknown time  . clopidogrel (PLAVIX) 75 MG tablet Take 75 mg by mouth daily with breakfast.   03/16/2017 at 0800  . cyanocobalamin ([employer] VITAMIN B12) 2000 MCG tablet Take 1 tablet (2,000 mcg total) by mouth daily. 30 tablet 11 03/16/2017 at Unknown time  . folic  acid (FOLVITE) 1 MG tablet Take 1 tablet (1 mg total) by mouth daily. 30 tablet 11 03/16/2017 at am  . furosemide (LASIX) 20 MG tablet Take 1 tablet (20 mg total) by mouth daily. 30 tablet 1 03/15/2017 at am  . gabapentin (NEURONTIN) 100 MG capsule TAKE 2 CAPSULES BY MOUTH 3  TIMES DAILY (Patient taking differently: Take 200 mg by mouth three times a day) 540 capsule 1 03/16/2017 at am  . hydrocortisone (ANUSOL-HC) 25 MG suppository Place 1 suppository (25 mg total) rectally every 12 (twelve) hours. (Patient taking differently: Place 25 mg rectally 2 (two) times daily as needed for hemorrhoids or itching. ) 12 suppository 1 PRN at PRN  . ondansetron (ZOFRAN) 4 MG tablet Take 1 tablet (4 mg total) by mouth every 8 (eight) hours as needed for nausea or vomiting. (Patient taking differently: Take 4 mg by mouth daily before breakfast. ) 30 tablet 0 03/16/2017 at am  . pantoprazole  (PROTONIX) 40 MG tablet Take 1 tablet (40 mg total) by mouth 2 (two) times daily. 60 tablet 11 03/16/2017 at am  . prednisoLONE acetate (PRED FORTE) 1 % ophthalmic suspension Place 1 drop into both eyes 4 (four) times daily.    03/16/2017 at am  . psyllium (METAMUCIL) 58.6 % packet Take 1 packet by mouth daily as needed (for constipation).    PRN at PRN  . sodium chloride (MURO 128) 2 % ophthalmic solution Place 1 drop into both eyes 3 (three) times daily.    03/16/2017 at am  . traMADol-acetaminophen (ULTRACET) 37.5-325 MG tablet Take 1 tablet by mouth every 6 (six) hours as needed for moderate pain or severe pain. 180 tablet 2 03/16/2017 at 1030  . erythromycin ophthalmic ointment as directed.    Not yet at Not yet  . potassium chloride SA (K-DUR,KLOR-CON) 20 MEQ tablet Take 2 tablets (40 mEq total) by mouth daily. (Patient not taking: Reported on 03/16/2017) 60 tablet 0 Not Taking at Unknown time   Scheduled:  . clopidogrel  75 mg Oral Q breakfast  . enoxaparin (LOVENOX) injection  40 mg Subcutaneous QHS  . folic acid  1 mg Oral Daily  . furosemide  20 mg Intravenous BID  . gabapentin  200 mg Oral TID  . ondansetron  4 mg Oral QAC breakfast  . pantoprazole  40 mg Oral BID  . potassium chloride SA  40 mEq Oral Daily  . prednisoLONE acetate  1 drop Both Eyes QID  . sodium chloride  1 drop Both Eyes TID  . cyanocobalamin  2,000 mcg Oral Daily    Assessment: 83 yo female with CP and positive troponin. Pharmacy consulted to dose heparin. She is noted on prophylactic lovenox with last dose at 10:30pm.  Goal of Therapy:  Heparin level 0.3-0.7 units/ml Monitor platelets by anticoagulation protocol: Yes   Plan:  -Heparin bolus 3000 units IV followed by 900 units/hr (~ 12 units/kg/hr) -Heparin level in 8 hours and daily wth CBC daily  Hildred Laser, Pharm D 03/17/2017 10:38 AM

## 2017-03-17 NOTE — Progress Notes (Signed)
[name] [name] for heparin  Indication: chest pain/ACS  Allergies  Allergen Reactions  . Codeine Diarrhea and Nausea Only  . Tape Other (See Comments)    SKIN IS VERY THIN AND TEARS AND BRUISES EASILY; PLEASE USE COBAN WRAP!!  . [name] [name] [Aspirin] Rash and Other (See Comments)    Petechia   . Iron Nausea And Vomiting    Oral iron causes nausea and vomiting  . Penicillins Rash    Has patient had a PCN reaction causing immediate rash, facial/tongue/throat swelling, SOB or lightheadedness with hypotension: Yes Has patient had a PCN reaction causing severe rash involving mucus membranes or skin necrosis: Unknown Has patient had a PCN reaction that required hospitalization: No Has patient had a PCN reaction occurring within the last 10 years: No If all of the above answers are "NO", then may proceed with Cephalosporin use.     Patient Measurements: Weight: 166 lb 14.2 oz (75.7 kg)  Vital Signs: Temp: 98.2 F (36.8 C) (08/14 1707) Temp Source: Oral (08/14 1707) BP: 113/60 (08/14 2000) Pulse Rate: 73 (08/14 2000)  Labs:  Recent Labs  03/16/17 1215 03/16/17 2343 03/17/17 0150 03/17/17 0406 03/17/17 1055 03/17/17 2016  HGB 12.6  --   --   --   --   --   HCT 39.5  --   --   --   --   --   PLT 160  --   --   --   --   --   HEPARINUNFRC  --   --   --   --   --  0.32  CREATININE 1.09*  --   --   --  1.11*  --   TROPONINI  --  0.47* 0.64* 0.66*  --   --     Estimated Creatinine Clearance: 32.9 mL/min (A) (by C-G formula based on SCr of 1.11 mg/dL (H)).    Assessment: 83 yo female with CP and positive troponin. Pharmacy consulted to dose heparin.  Heparin level = 0.32  Goal of Therapy:  Heparin level 0.3-0.7 units/ml Monitor platelets by anticoagulation protocol: Yes   Plan:  Increase heparin to 1000 units / hr Follow up AM labs  Thank you [name], PharmD [phone] - 03/17/2017 9:23 PM

## 2017-03-17 NOTE — Plan of Care (Signed)
Problem: Pain Managment: Goal: General experience of comfort will improve Outcome: Progressing Pt admitted for chest pain. No chest pain at this time.

## 2017-03-17 NOTE — Progress Notes (Signed)
Notified by CMT that patient had frequent runs of bigeminy and trigeminy.  I notified Fabian Sharp PA.  No new orders received.  Will continue to monitor.

## 2017-03-17 NOTE — Progress Notes (Signed)
  Echocardiogram 2D Echocardiogram has been performed.  Lynley Killilea G Bodi Palmeri 03/17/2017, 3:30 PM

## 2017-03-17 NOTE — Consult Note (Signed)
Cardiology Consultation:   Patient ID: JALISIA PUCHALSKI; [id number]; [birth year]   Admit date: 03/16/2017 Date of Consult: 03/17/2017   Primary Care Provider: [name], MD Primary Cardiologist: Dr. Bronson Ing Primary Electrophysiologist:     Patient Profile:   [name] [name] is a 83 y.o. 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 Quincy Simmonds.  History of Present Illness:   [name] [name] is known to this service and last saw Dr. Bronson Ing in clinic on 10/08/15. At that time she was in her usual state of health. Mild aortic root dilatation and thoracic aortic aneurysm with mild aortic regurgitation were mentioned at that visit. No medication changes were made and the pt states she did not want to have any intervention for her aortic valve or aneurysm.  History was difficult to gather secondary to her hearing impairment. She presented to [facility] after speaking with her PCP about onset of chest pain on Sat 03/14/17. She started having chest pain on Sat and states she woke up with the pain. There is question if the pain was worsened by movement or upper extremity movement. The pain was located from her epigastric region up to her superior central chest and radiated to her back. She waited until Monday 03/16/17 to be evaluated by her PCP because she didn't want to go to [facility]. The pain was described as a pressure; however, she is currently chest pain-free.   CT angio was negative for an acute dissection and showed a stable aneurysm in her descending aorta. BNP pending.    Past Medical History:  Diagnosis Date  . Aneurysm, thoracic aortic ([place])   . Aortic insufficiency   . Ascending aortic aneurysm ([place])    CT  in 2012 - 4.2x4.2cm  . Atrial fibrillation ([place])   . Bladder infection    h/o  . 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 Dehydration    HISTORY   . Fall at home 09/10/12  . Hemorrhoids   . Hip fracture ([place])    hip surgery 2001  . History of blood clots    in leg  . History of knee surgery   . Hypertension   . IDA (iron deficiency anemia) 06/07/2013  . Iron deficiency anemia, unspecified 03/14/2013   secondary to GI blood loss  . Kidney infection    h/o  . Melanoma of skin ([place]) 03/14/2013  . Melanoma of thigh ([place])    left  . Mini stroke ([place])   . S/P colonoscopy March 2010   RMR: friable anal canal hemorrhoids, hyperplastic ascending polyp, adenomatous descending polyp   . Stomach ulcer    secondary to h.pylori, s/p treatment  . Thyroid condition   . Venous stasis    edema    Past Surgical History:  Procedure Laterality Date  . APPENDECTOMY  1942  . BREAST LUMPECTOMY  1998  . CATARACT EXTRACTION, BILATERAL    . CHOLECYSTECTOMY    . COLONOSCOPY  06/05/11   pancolonic diverticulosis/ileal reosion/abnormal anorectal junction s/p biopsy: path for small intestine and Ti was benign with non-villous atrophy, rectal biopsy with prominent prolapse changes, no acute inflammation  . CORNEAL TRANSPLANT Bilateral 2013  .  ENTEROSCOPY N/A 06/13/2013   HAL:PFXT Gastritis/GI BLEED MOST LIKELY DUE TO DUODENAL ULCERS, AND ? AVMs  . ESOPHAGOGASTRODUODENOSCOPY  06/05/11   small hiatal hernia; + H.PYLORI GASTRITIS, s/p 5 days of Pylera, unable to finish due to N/V  . ESOPHAGOGASTRODUODENOSCOPY N/A 06/13/2013   Dr. Oneida Alar- see enteroscopy  . ESOPHAGOGASTRODUODENOSCOPY (EGD) WITH ESOPHAGEAL DILATION N/A 06/08/2013   KWI:OXBDZ hiatal hernia;  otherwise normal EGD s/p dilation  . GIVENS CAPSULE STUDY N/A 06/08/2013   Procedure: GIVENS CAPSULE STUDY;  Surgeon: Daneil Dolin, MD;  Location: [facility];  Service: Endoscopy;  Laterality: N/A;  . HEMORRHOID BANDING      . KNEE SURGERY Right 05/2006   total right  . LEG SURGERY    . MELANOMA EXCISION Left 08/2006   left leg excision  . NM MYOCAR PERF WALL MOTION  03/27/2010   dipyridamole; small reversible basal to mid-septal defect (?artifact), post-stress EF 55%, low risk scan   . PARTIAL HYSTERECTOMY  1976  . TONSILLECTOMY    . TOTAL HIP ARTHROPLASTY Left 2001&2004    X 2 FOR LEFT HIP  . TRANSTHORACIC ECHOCARDIOGRAM  10/06/2012   EF 55-60%, mod eccentric hypertrophy, grade 2 diastolic dysfunction; mildly calcifed AV annulus with moderate regurg; aortic root mildly dilated; LA severely dailted; PA peak pressure 45mg  . VARICOSE VEIN SURGERY       Inpatient Medications: Scheduled Meds: . clopidogrel  75 mg Oral Q breakfast  . enoxaparin (LOVENOX) injection  40 mg Subcutaneous QHS  . folic acid  1 mg Oral Daily  . furosemide  40 mg Intravenous BID  . gabapentin  200 mg Oral TID  . ondansetron  4 mg Oral QAC breakfast  . pantoprazole  40 mg Oral BID  . potassium chloride SA  40 mEq Oral Daily  . prednisoLONE acetate  1 drop Both Eyes QID  . sodium chloride  1 drop Both Eyes TID  . cyanocobalamin  2,000 mcg Oral Daily   Continuous Infusions:  PRN Meds: acetaminophen, albuterol, gi cocktail, hydrocortisone, ondansetron (ZOFRAN) IV, traMADol-acetaminophen  Allergies:    Allergies  Allergen Reactions  . Codeine Diarrhea and Nausea Only  . Tape Other (See Comments)    SKIN IS VERY THIN AND TEARS AND BRUISES EASILY; PLEASE USE COBAN WRAP!!  . ADiona Fanti[Aspirin] Rash and Other (See Comments)    Petechia   . Iron Nausea And Vomiting    Oral iron causes nausea and vomiting  . Penicillins Rash    Has patient had a PCN reaction causing immediate rash, facial/tongue/throat swelling, SOB or lightheadedness with hypotension: Yes Has patient had a PCN reaction causing severe rash involving mucus membranes or skin necrosis: Unknown Has patient had a PCN reaction that required hospitalization: No Has  patient had a PCN reaction occurring within the last 10 years: No If all of the above answers are "NO", then may proceed with Cephalosporin use.     Social History:   Social History   Social History  . Marital status: Widowed    Spouse name: N/A  . Number of children: 5  . Years of education: 6   Occupational History  .  Retired   Social History Main Topics  . Smoking status: Former Smoker    Packs/day: 1.50    Years: 20.00    Types: Cigarettes    Quit date: 07/05/1975  . Smokeless tobacco: Never Used     Comment: Quit smoking in 1975  . Alcohol use No  . Drug use:  No  . Sexual activity: No   Other Topics Concern  . Not on file   Social History Narrative  . No narrative on file    Family History:    Family History  Problem Relation Age of Onset  . Breast cancer Daughter        also hyperlipidemia  . Breast cancer Daughter        also hyperlipidemia  . Asthma Mother   . Multiple sclerosis Child   . Hyperlipidemia Child   . Colon cancer Neg Hx      ROS:  Please see the history of present illness.  ROS  All other ROS reviewed and negative.     Physical Exam/Data:   Vitals:   03/16/17 2200 03/16/17 2230 03/16/17 2326 03/17/17 0423  BP: (!) 133/58 (!) 133/59 (!) 141/77 110/60  Pulse: 78 63 (!) 107 70  Resp: (!) 21 19 18  (!) 22  Temp:   97.9 F (36.6 C) 97.8 F (36.6 C)  TempSrc:   Oral Oral  SpO2: 91% 92% 97% 97%  Weight:    166 lb 14.2 oz (75.7 kg)    Intake/Output Summary (Last 24 hours) at 03/17/17 0752 Last data filed at 03/17/17 0648  Gross per 24 hour  Intake                0 ml  Output              552 ml  Net             -552 ml   Filed Weights   03/17/17 0423  Weight: 166 lb 14.2 oz (75.7 kg)   Body mass index is 27.77 kg/m.  General:  Well nourished, well developed, in no acute distress HEENT: normal Lymph: no adenopathy Neck: no JVD Endocrine:  No thryomegaly Vascular: No carotid bruits; FA pulses 2+ bilaterally without  bruits  Cardiac:  normal S1, S2; RRR; no murmur  Lungs:  clear to auscultation bilaterally, no wheezing, rhonchi or rales  Abd: soft, nontender, no hepatomegaly  Ext: no edema Musculoskeletal:  No deformities, BUE and BLE strength normal and equal Skin: warm and dry  Neuro:  CNs 2-12 intact, no focal abnormalities noted Psych:  Normal affect   EKG:  The EKG was personally reviewed and demonstrates:  Afib with RBBB, Q waves V1-3, inverted T waves in precordial leads Telemetry:  Telemetry was personally reviewed and demonstrates:  Afib  Relevant CV Studies:  Echocardiogram 10/05/14: Study Conclusions - Left ventricle: The cavity size was normal. Wall thickness was normal. Systolic function was normal. The estimated ejection fraction was in the range of 55% to 60%. Speckle tracking assessment does not look to be accurate and is not reported. Cannot exclude hypokinesis of the basalinferior myocardium. Doppler parameters are consistent with borderline restrictive physiology, indicative of decreased left ventricular diastolic compliance and/or increased left atrial pressure. - Aortic valve: Mildly calcified annulus. Trileaflet; mildly thickened, mildly calcified leaflets. There was overall mild (possibly moderate in some views) regurgitation directed eccentrically in the LVOT. Difficult to get reliable vena contracta, therefore not reported. - Ascending aorta: The ascending aorta was mildly dilated. Largest measurement was 40 mm. - Mitral valve: Moderately calcified annulus. There was mild regurgitation. - Left atrium: The atrium was severely dilated. - Right atrium: The atrium was mildly dilated. Central venous pressure (est): 3 mm Hg. - Tricuspid valve: There was trivial regurgitation. - Pulmonary arteries: PA peak pressure: 32 mm Hg (S) based on incomplete  tricuspid regurgitation Doppler envelope. - Pericardium, extracardiac: There was no pericardial  effusion.  Impressions: - Normal LV wall thickness and chamber size with LVEF 55-60%, cannot exclude basal inferior hypokinesis. There is borderline restrictive physiology with increased filling pressures. Severe left atrial enlargement. MAC with mild mitral regurgitation. Sclerotic aortic valve with overall mild aortic regurgitation based on most views, see above discussion. Ascending aortic root is mildly dilated, measures 40 mm based on limited imaging.  Laboratory Data:  Chemistry Recent Labs Lab 03/16/17 1215  NA 141  K 3.3*  CL 103  CO2 29  GLUCOSE 104*  BUN 12  CREATININE 1.09*  CALCIUM 8.4*  GFRNONAA 43*  GFRAA 49*  ANIONGAP 9     Recent Labs Lab 03/16/17 1215  PROT 6.6  ALBUMIN 3.7  AST 25  ALT 10*  ALKPHOS 62  BILITOT 1.2   Hematology Recent Labs Lab 03/16/17 1215  WBC 5.0  RBC 4.23  HGB 12.6  HCT 39.5  MCV 93.4  MCH 29.8  MCHC 31.9  RDW 14.8  PLT 160   Cardiac Enzymes Recent Labs Lab 03/16/17 2343 03/17/17 0150 03/17/17 0406  TROPONINI 0.47* 0.64* 0.66*    Recent Labs Lab 03/16/17 1232  TROPIPOC 0.02    BNPNo results for input(s): BNP, PROBNP in the last 168 hours.  DDimer No results for input(s): DDIMER in the last 168 hours.  Radiology/Studies:  Dg Chest 2 View  Result Date: 03/16/2017 CLINICAL DATA:  83 year old female with chest pain and shortness of breath for 3 days. EXAM: CHEST  2 VIEW COMPARISON:  05/18/2016 FINDINGS: Cardiomediastinal silhouette is stable in size and configuration. Coarsened interstitial markings appear similar to prior examination. There is no focal airspace opacity, pleural effusion or pneumothorax. Chronic and degenerative osseous changes appear stable, including anterior wedge compression deformity of an upper vertebra. No acute osseous abnormality. IMPRESSION: 1. No active cardiopulmonary disease. 2. Stable cardiomegaly and coarsened interstitial markings. 3. Stable chronic and degenerative  osseous changes. Electronically Signed   By: Kristopher Oppenheim M.D.   On: 03/16/2017 12:29   Ct Angio Chest/abd/pel For Dissection W And/or Wo Contrast  Result Date: 03/16/2017 CLINICAL DATA:  Chest pain radiating into back with some associated shortness of breath. EXAM: CT ANGIOGRAPHY CHEST, ABDOMEN AND PELVIS TECHNIQUE: Multidetector CT imaging through the chest, abdomen and pelvis was performed using the standard protocol during bolus administration of intravenous contrast. Multiplanar reconstructed images and MIPs were obtained and reviewed to evaluate the vascular anatomy. CONTRAST:  80 mL Isovue 370 IV COMPARISON:  CT of the abdomen and pelvis on 11/06/2015 FINDINGS: CTA CHEST FINDINGS Cardiovascular: There is stable dilatation of the ascending thoracic aorta which measures 4.6 cm in greatest diameter. The aorta measures approximately 4.3 cm at the level of the sinuses of Valsalva. The proximal arch measures 3.6 cm. The distal arch measures 3.0 cm. The descending thoracic aorta measures 2.6 cm. No evidence of aortic dissection or intramural hemorrhage. No evidence of penetrating ulcer disease. The proximal great vessels show tortuosity without significant stenosis or aneurysmal disease. The heart is significantly enlarged. Calcified plaque is noted in the coronary tree in a 3 vessel distribution. There are some mild calcifications at the level of the aortic valve and mitral annulus. No pericardial fluid identified. Central pulmonary arteries are very prominent in caliber with the main pulmonary artery measuring approximately 4.3 cm in diameter. Findings are consistent with underlying pulmonary hypertension. Mediastinum/Nodes: No evidence of mediastinal, hilar or axillary lymphadenopathy. No mediastinal masses identified. No gross  abnormalities involving the trachea or esophagus. Lungs/Pleura: Lungs demonstrate bilateral pulmonary scarring. No evidence of focal airspace consolidation, pulmonary edema or  pleural effusions. No pulmonary masses or pneumothorax. Musculoskeletal: Bony structures are unremarkable. Review of the MIP images confirms the above findings. CTA ABDOMEN AND PELVIS FINDINGS VASCULAR Aorta: The abdominal aorta shows scattered plaque without evidence of aneurysm or dissection. Celiac: Origin stenosis of approximately 50%. SMA: Normally patent. Renals: Mild plaque at the origins of bilateral single renal arteries without evidence of stenosis. IMA: Normally patent. Inflow: Bilateral iliac arteries show moderate tortuosity and mild plaque without evidence of stenosis or aneurysmal disease. The common femoral arteries and femoral bifurcations are normally patent bilaterally. Review of the MIP images confirms the above findings. NON-VASCULAR Hepatobiliary: No focal liver abnormality is seen. Status post cholecystectomy. No biliary dilatation. Pancreas: Unremarkable. No pancreatic ductal dilatation or surrounding inflammatory changes. Spleen: Normal in size without focal abnormality. Adrenals/Urinary Tract: Small lateral interpolar cortical mass visualized on the arterial phase and measures approximately 1.2 cm. This shows no significant growth since the prior study in 2017. Stable cyst of the contralateral left kidney. Adrenal glands appear unremarkable. The bladder is decompressed. Stomach/Bowel: Bowel shows no evidence of obstruction or inflammation. Diverticulosis of the sigmoid colon noted without evidence of acute diverticulitis. Lymphatic: No enlarged lymph nodes identified. Reproductive: Status post hysterectomy. No adnexal masses. Other: No abdominal wall hernia or abnormality. No abdominopelvic ascites. Musculoskeletal: Bones are osteopenic. No fractures or lesions identified. Review of the MIP images confirms the above findings. IMPRESSION: 1. Stable aneurysmal disease of the ascending thoracic aorta which measures 4.6 cm in greatest diameter. There is no evidence of acute aortic dissection or  other acute aortic syndrome. 2. Coronary atherosclerosis with calcified plaque in a 3 vessel distribution. 3. Significant cardiac enlargement without pulmonary edema. 4. Evidence of pulmonary hypertension with significant dilatation of the main pulmonary artery and central pulmonary arteries. 5. Stable roughly 1.2 cm right renal mass centered in the lateral interpolar cortex. This shows no significant growth since a prior CT on 11/06/2015. Electronically Signed   By: Aletta Edouard M.D.   On: 03/16/2017 14:25    Assessment and Plan:   1. Chest pain, CAD - 0.47 --> 0.64 --> 0.66 - EKG with Afib and RBBB - she is on plavix, intolerant to statins History was difficult to gather given the patient's hearing impairment. CT with calcifications in 3 vessels. PE was not assessed. She denies recent falls. It is unclear if her chest pain symptoms are typical or atypical in nature. Troponin is elevated. BNP pending. Will discuss with attending further ischemic evaluation. Recommend echocardiogram to assess AI and function. This echo may guide further ischemic evaluation. She is DNR status. She denies recent falls; however, records indicate she is intolerant of ASA and would therefore not be a candidate for DAPT.   2. Lower extremity swelling - echo pending - she agrees that she is having some lower extremity swelling above her baseline   3. Afib - this is a chronic arrhythmia - no AC, but she reports no recent falls - she is on no rate-controlling medications   4. Thoracic aneurysm - stable per CT, no acute dissection   Signed, [name] Bottcher, PA  03/17/2017 7:52 AM   History and all data above reviewed.  Patient examined.  I agree with the findings as above.  The patient was feeling well until this weekend.  She developed chest pain at rest.  This was substernal and severe.  However, she waited until Monday to see her PCP and was admitted.  Cardiac enzymes are positive.  EKG with deep broad T  wave inversion in the anterior lateral leads.  The patient exam reveals COR:RRR  ,  Lungs: Clear  ,  Abd: Positive bowel sounds, no rebound no guarding, Ext No edema.  All available labs, radiology testing, previous records reviewed. Agree with documented assessment and plan.  NSTEMI;  I had a discussion with the patient (who is very hard of hearing) and her daughter.  She would consider a cardiac cath.  I will start heparin and order an echo.  If her EF is perfectly normal and she has no further pain with ambulation she might want to opt for conservative medical management given her age and ASA allergy.  We will keep NPO after MN and see early to discuss.    [name] [name]  10:29 AM  03/17/2017

## 2017-03-17 NOTE — Progress Notes (Signed)
Patient had 16 beats VTACH at [facility].  Ignacia Bayley NP notified.

## 2017-03-17 NOTE — Progress Notes (Addendum)
Troponin positive at 0.47. Pt has no chest pain. MD notified.  Ferdinand Lango, RN

## 2017-03-18 ENCOUNTER — Encounter (HOSPITAL_COMMUNITY)
Admission: EM | Disposition: A | Discharge status: Home / Self Care | Payer: Self-pay | Source: Home / Self Care | Attending: Internal Medicine

## 2017-03-18 ENCOUNTER — Encounter (HOSPITAL_COMMUNITY): Payer: Self-pay | Admitting: Cardiovascular Disease

## 2017-03-18 DIAGNOSIS — I272 Pulmonary hypertension, unspecified: Secondary | ICD-10-CM

## 2017-03-18 HISTORY — PX: LEFT HEART CATH AND CORONARY ANGIOGRAPHY: CATH118249

## 2017-03-18 LAB — GLUCOSE, CAPILLARY
GLUCOSE-CAPILLARY: 94 mg/dL (ref 65–99)
Glucose-Capillary: 99 mg/dL (ref 65–99)

## 2017-03-18 LAB — CBC
HEMATOCRIT: 40.6 % (ref 36.0–46.0)
HEMOGLOBIN: 12.9 g/dL (ref 12.0–15.0)
MCH: 29.4 pg (ref 26.0–34.0)
MCHC: 31.8 g/dL (ref 30.0–36.0)
MCV: 92.5 fL (ref 78.0–100.0)
Platelets: 154 10*3/uL (ref 150–400)
RBC: 4.39 MIL/uL (ref 3.87–5.11)
RDW: 14.5 % (ref 11.5–15.5)
WBC: 5.8 10*3/uL (ref 4.0–10.5)

## 2017-03-18 LAB — BASIC METABOLIC PANEL
Anion gap: 10 (ref 5–15)
BUN: 15 mg/dL (ref 6–20)
CALCIUM: 8.4 mg/dL — AB (ref 8.9–10.3)
CHLORIDE: 102 mmol/L (ref 101–111)
CO2: 30 mmol/L (ref 22–32)
CREATININE: 1.24 mg/dL — AB (ref 0.44–1.00)
GFR calc non Af Amer: 37 mL/min — ABNORMAL LOW (ref >60)
GFR, EST AFRICAN AMERICAN: 42 mL/min — AB (ref >60)
Glucose, Bld: 99 mg/dL (ref 65–99)
Potassium: 3.8 mmol/L (ref 3.5–5.1)
Sodium: 142 mmol/L (ref 135–145)

## 2017-03-18 LAB — MAGNESIUM: MAGNESIUM: 1.9 mg/dL (ref 1.7–2.4)

## 2017-03-18 LAB — PROTIME-INR
INR: 1.01
Prothrombin Time: 13.3 seconds (ref 11.4–15.2)

## 2017-03-18 LAB — SURGICAL PCR SCREEN
MRSA, PCR: NEGATIVE
STAPHYLOCOCCUS AUREUS: NEGATIVE

## 2017-03-18 LAB — HEPARIN LEVEL (UNFRACTIONATED): HEPARIN UNFRACTIONATED: 0.35 [IU]/mL (ref 0.30–0.70)

## 2017-03-18 LAB — HEMOGLOBIN A1C
Hgb A1c MFr Bld: 5.5 % (ref 4.8–5.6)
MEAN PLASMA GLUCOSE: 111 mg/dL

## 2017-03-18 SURGERY — LEFT HEART CATH AND CORONARY ANGIOGRAPHY
Anesthesia: LOCAL

## 2017-03-18 MED ORDER — VERAPAMIL HCL 2.5 MG/ML IV SOLN
INTRAVENOUS | Status: DC | PRN
  Administered 2017-03-18: 10 mL via INTRA_ARTERIAL

## 2017-03-18 MED ORDER — SODIUM CHLORIDE 0.9 % IV SOLN: INTRAVENOUS | Status: AC

## 2017-03-18 MED ORDER — SODIUM CHLORIDE 0.9 % IV SOLN: INTRAVENOUS | Status: DC

## 2017-03-18 MED ORDER — HEPARIN (PORCINE) IN NACL 2-0.9 UNIT/ML-% IJ SOLN
INTRAMUSCULAR | Status: AC | PRN
  Administered 2017-03-18: 1000 mL

## 2017-03-18 MED ORDER — HEPARIN SODIUM (PORCINE) 1000 UNIT/ML IJ SOLN
INTRAMUSCULAR | Status: AC
  Filled 2017-03-18: qty 1

## 2017-03-18 MED ORDER — SODIUM CHLORIDE 0.9 % IV SOLN: 250.0000 mL | INTRAVENOUS | Status: DC | PRN

## 2017-03-18 MED ORDER — LIDOCAINE HCL (PF) 1 % IJ SOLN
INTRAMUSCULAR | Status: DC | PRN
  Administered 2017-03-18: 2 mL

## 2017-03-18 MED ORDER — IOPAMIDOL (ISOVUE-370) INJECTION 76%
INTRAVENOUS | Status: AC
  Filled 2017-03-18: qty 100

## 2017-03-18 MED ORDER — HEPARIN SODIUM (PORCINE) 1000 UNIT/ML IJ SOLN
INTRAMUSCULAR | Status: DC | PRN
  Administered 2017-03-18: 3000 [IU] via INTRAVENOUS

## 2017-03-18 MED ORDER — SODIUM CHLORIDE 0.9% FLUSH: 3.0000 mL | INTRAVENOUS | Status: DC | PRN

## 2017-03-18 MED ORDER — LIDOCAINE HCL 1 % IJ SOLN
INTRAMUSCULAR | Status: AC
  Filled 2017-03-18: qty 20

## 2017-03-18 MED ORDER — HEPARIN (PORCINE) IN NACL 100-0.45 UNIT/ML-% IJ SOLN: 1050.0000 [IU]/h | INTRAMUSCULAR | Status: DC

## 2017-03-18 MED ORDER — VERAPAMIL HCL 2.5 MG/ML IV SOLN
INTRAVENOUS | Status: AC
  Filled 2017-03-18: qty 2

## 2017-03-18 MED ORDER — SODIUM CHLORIDE 0.9% FLUSH
3.0000 mL | Freq: Two times a day (BID) | INTRAVENOUS | Status: DC
  Administered 2017-03-19: 3 mL via INTRAVENOUS

## 2017-03-18 MED ORDER — HEPARIN (PORCINE) IN NACL 2-0.9 UNIT/ML-% IJ SOLN
INTRAMUSCULAR | Status: AC
  Filled 2017-03-18: qty 1000

## 2017-03-18 SURGICAL SUPPLY — 14 items
CATH EXPO 5FR FR4 (CATHETERS) ×2 IMPLANT
CATH INFINITI 5FR JL4 (CATHETERS) ×2 IMPLANT
CATH INFINITI 5FR JL5 (CATHETERS) ×2 IMPLANT
CATH OPTITORQUE JACKY 4.0 5F (CATHETERS) ×2 IMPLANT
DEVICE RAD COMP TR BAND LRG (VASCULAR PRODUCTS) ×2 IMPLANT
GLIDESHEATH SLEND SS 6F .021 (SHEATH) ×2 IMPLANT
GUIDEWIRE INQWIRE 1.5J.035X260 (WIRE) ×1 IMPLANT
INQWIRE 1.5J .035X260CM (WIRE) ×2
KIT HEART LEFT (KITS) ×2 IMPLANT
PACK CARDIAC CATHETERIZATION (CUSTOM PROCEDURE TRAY) ×2 IMPLANT
SYR MEDRAD MARK V 150ML (SYRINGE) ×2 IMPLANT
TRANSDUCER W/STOPCOCK (MISCELLANEOUS) ×2 IMPLANT
TUBING CIL FLEX 10 FLL-RA (TUBING) ×2 IMPLANT
WIRE HI TORQ VERSACORE-J 145CM (WIRE) ×2 IMPLANT

## 2017-03-18 NOTE — Progress Notes (Signed)
Progress Note  Patient Name: [name] [name] Date of Encounter: 03/18/2017  Primary Cardiologist: Dr. Bronson Ing  Subjective   She denies any chest pain or shortness of breath. She continues to have dyspnea. Echocardiogram showed akinesis of the mid to distal anterior and apical wall suggestive of an LAD infarct.  Inpatient Medications    Scheduled Meds: . carvedilol  3.125 mg Oral BID WC  . clopidogrel  75 mg Oral Q breakfast  . folic acid  1 mg Oral Daily  . furosemide  20 mg Intravenous BID  . gabapentin  200 mg Oral TID  . ondansetron  4 mg Oral QAC breakfast  . pantoprazole  40 mg Oral BID  . potassium chloride SA  40 mEq Oral BID  . prednisoLONE acetate  1 drop Both Eyes QID  . sodium chloride  1 drop Both Eyes TID  . cyanocobalamin  2,000 mcg Oral Daily   Continuous Infusions: . heparin 1,000 Units/hr (03/17/17 2204)   PRN Meds: acetaminophen, albuterol, gi cocktail, hydrocortisone, ondansetron (ZOFRAN) IV, traMADol-acetaminophen   Vital Signs    Vitals:   03/17/17 2300 03/18/17 0000 03/18/17 0100 03/18/17 0501  BP: (!) 109/51 (!) 115/58 (!) 114/53 (!) 107/58  Pulse: 70 69 67 69  Resp: (!) 23 (!) 23 (!) 24   Temp:    98.1 F (36.7 C)  TempSrc:    Oral  SpO2: 92% 90% 95% 90%  Weight:    170 lb 13.7 oz (77.5 kg)  Height:    5\' 2"  (1.575 m)    Intake/Output Summary (Last 24 hours) at 03/18/17 0920 Last data filed at 03/18/17 0300  Gross per 24 hour  Intake           132.73 ml  Output              700 ml  Net          -567.27 ml   Filed Weights   03/17/17 0423 03/18/17 0501  Weight: 166 lb 14.2 oz (75.7 kg) 170 lb 13.7 oz (77.5 kg)    Telemetry    Atrial fibrillation atrial fibrillation. 17 beat run of wide-complex tachycardia likely ventricular tachycardia - Personally Reviewed  ECG    Atrial fibrillation with right bundle branch block - Personally Reviewed  Physical Exam   GEN: No acute distress.   Neck: No JVD Cardiac: Irregularly  irregular, no  rubs, or gallops. One out of 6 diastolic murmur at the left sternal border Respiratory: Clear to auscultation bilaterally. GI: Soft, nontender, non-distended  MS: No edema; No deformity. Neuro:  Nonfocal  Psych: Normal affect  Radial pulse is slightly diminished bilaterally.  Labs    Chemistry Recent Labs Lab 03/16/17 1215 03/17/17 1055 03/18/17 0438  NA 141 141 142  K 3.3* 3.3* 3.8  CL 103 100* 102  CO2 29 31 30   GLUCOSE 104* 100* 99  BUN 12 12 15   CREATININE 1.09* 1.11* 1.24*  CALCIUM 8.4* 8.5* 8.4*  PROT 6.6  --   --   ALBUMIN 3.7  --   --   AST 25  --   --   ALT 10*  --   --   ALKPHOS 62  --   --   BILITOT 1.2  --   --   GFRNONAA 43* 42* 37*  GFRAA 49* 48* 42*  ANIONGAP 9 10 10      Hematology Recent Labs Lab 03/16/17 1215 03/18/17 0438  WBC 5.0 5.8  RBC 4.23 4.39  HGB 12.6 12.9  HCT 39.5 40.6  MCV 93.4 92.5  MCH 29.8 29.4  MCHC 31.9 31.8  RDW 14.8 14.5  PLT 160 154    Cardiac Enzymes Recent Labs Lab 03/16/17 2343 03/17/17 0150 03/17/17 0406  TROPONINI 0.47* 0.64* 0.66*    Recent Labs Lab 03/16/17 1232  TROPIPOC 0.02     BNPNo results for input(s): BNP, PROBNP in the last 168 hours.   DDimer No results for input(s): DDIMER in the last 168 hours.   Radiology    Dg Chest 2 View  Result Date: 03/16/2017 CLINICAL DATA:  83 year old female with chest pain and shortness of breath for 3 days. EXAM: CHEST  2 VIEW COMPARISON:  05/18/2016 FINDINGS: Cardiomediastinal silhouette is stable in size and configuration. Coarsened interstitial markings appear similar to prior examination. There is no focal airspace opacity, pleural effusion or pneumothorax. Chronic and degenerative osseous changes appear stable, including anterior wedge compression deformity of an upper vertebra. No acute osseous abnormality. IMPRESSION: 1. No active cardiopulmonary disease. 2. Stable cardiomegaly and coarsened interstitial markings. 3. Stable chronic and  degenerative osseous changes. Electronically Signed   By: Kristopher Oppenheim M.D.   On: 03/16/2017 12:29   Ct Angio Chest/abd/pel For Dissection W And/or Wo Contrast  Result Date: 03/16/2017 CLINICAL DATA:  Chest pain radiating into back with some associated shortness of breath. EXAM: CT ANGIOGRAPHY CHEST, ABDOMEN AND PELVIS TECHNIQUE: Multidetector CT imaging through the chest, abdomen and pelvis was performed using the standard protocol during bolus administration of intravenous contrast. Multiplanar reconstructed images and MIPs were obtained and reviewed to evaluate the vascular anatomy. CONTRAST:  80 mL Isovue 370 IV COMPARISON:  CT of the abdomen and pelvis on 11/06/2015 FINDINGS: CTA CHEST FINDINGS Cardiovascular: There is stable dilatation of the ascending thoracic aorta which measures 4.6 cm in greatest diameter. The aorta measures approximately 4.3 cm at the level of the sinuses of Valsalva. The proximal arch measures 3.6 cm. The distal arch measures 3.0 cm. The descending thoracic aorta measures 2.6 cm. No evidence of aortic dissection or intramural hemorrhage. No evidence of penetrating ulcer disease. The proximal great vessels show tortuosity without significant stenosis or aneurysmal disease. The heart is significantly enlarged. Calcified plaque is noted in the coronary tree in a 3 vessel distribution. There are some mild calcifications at the level of the aortic valve and mitral annulus. No pericardial fluid identified. Central pulmonary arteries are very prominent in caliber with the main pulmonary artery measuring approximately 4.3 cm in diameter. Findings are consistent with underlying pulmonary hypertension. Mediastinum/Nodes: No evidence of mediastinal, hilar or axillary lymphadenopathy. No mediastinal masses identified. No gross abnormalities involving the trachea or esophagus. Lungs/Pleura: Lungs demonstrate bilateral pulmonary scarring. No evidence of focal airspace consolidation, pulmonary  edema or pleural effusions. No pulmonary masses or pneumothorax. Musculoskeletal: Bony structures are unremarkable. Review of the MIP images confirms the above findings. CTA ABDOMEN AND PELVIS FINDINGS VASCULAR Aorta: The abdominal aorta shows scattered plaque without evidence of aneurysm or dissection. Celiac: Origin stenosis of approximately 50%. SMA: Normally patent. Renals: Mild plaque at the origins of bilateral single renal arteries without evidence of stenosis. IMA: Normally patent. Inflow: Bilateral iliac arteries show moderate tortuosity and mild plaque without evidence of stenosis or aneurysmal disease. The common femoral arteries and femoral bifurcations are normally patent bilaterally. Review of the MIP images confirms the above findings. NON-VASCULAR Hepatobiliary: No focal liver abnormality is seen. Status post cholecystectomy. No biliary dilatation. Pancreas: Unremarkable. No pancreatic ductal dilatation or surrounding inflammatory  changes. Spleen: Normal in size without focal abnormality. Adrenals/Urinary Tract: Small lateral interpolar cortical mass visualized on the arterial phase and measures approximately 1.2 cm. This shows no significant growth since the prior study in 2017. Stable cyst of the contralateral left kidney. Adrenal glands appear unremarkable. The bladder is decompressed. Stomach/Bowel: Bowel shows no evidence of obstruction or inflammation. Diverticulosis of the sigmoid colon noted without evidence of acute diverticulitis. Lymphatic: No enlarged lymph nodes identified. Reproductive: Status post hysterectomy. No adnexal masses. Other: No abdominal wall hernia or abnormality. No abdominopelvic ascites. Musculoskeletal: Bones are osteopenic. No fractures or lesions identified. Review of the MIP images confirms the above findings. IMPRESSION: 1. Stable aneurysmal disease of the ascending thoracic aorta which measures 4.6 cm in greatest diameter. There is no evidence of acute aortic  dissection or other acute aortic syndrome. 2. Coronary atherosclerosis with calcified plaque in a 3 vessel distribution. 3. Significant cardiac enlargement without pulmonary edema. 4. Evidence of pulmonary hypertension with significant dilatation of the main pulmonary artery and central pulmonary arteries. 5. Stable roughly 1.2 cm right renal mass centered in the lateral interpolar cortex. This shows no significant growth since a prior CT on 11/06/2015. Electronically Signed   By: Aletta Edouard M.D.   On: 03/16/2017 14:25    Cardiac Studies   Echo: - Left ventricle: The cavity size was normal. Systolic function was   moderately reduced. The estimated ejection fraction was in the   range of 35% to 40%. Dyskinesis of the mid-apicalanteroseptal and   anterior myocardium; consistent with infarction in the   distribution of the left anterior descending coronary artery. No   evidence of thrombus. - Aortic valve: There was mild to moderate regurgitation directed   towards the mitral anterior leaflet. - Ascending aorta: The ascending aorta was mildly dilated. - Mitral valve: Calcified annulus. There was mild to moderate   regurgitation directed centrally. - Left atrium: The atrium was moderately to severely dilated. - Right atrium: The atrium was mildly dilated. - Tricuspid valve: There was moderate regurgitation. - Pulmonary arteries: Systolic pressure was mildly increased. PA   peak pressure: 42 mm Hg (S).  Patient Profile     83 y.o. female with known history of chronic atrial fibrillation not on anticoagulation, hypertension, previous breast cancer, renal mass thought to be due to [place], ascending aortic aneurysm and aortic insufficiency who presented with atypical chest pain and was found to have mildly elevated troponin.  Assessment & Plan    1. Non-ST elevation myocardial infarction CT scan showed three-vessel calcifications. Echocardiogram was suggestive of LAD infarct. Continue  unfractionated heparin. I discussed with the patient and her 2 daughters management options including conservative versus invasive workup. The patient has been independent and is able to do activities of daily living and thus I think it's reasonable to proceed with cardiac catheterization. I discussed the procedure in details as well as risk and benefits. We'll do coronary angiography without left ventricular angiography given mildly elevated creatinine. Plan the right radial artery access. She is on Plavix alone. She had stomach ulcers with aspirin in the past.  2. Chronic atrial fibrillation: Ventricular rate is controlled.  The patient is very hard of hearing.  Signed, Kathlyn Sacramento, MD  03/18/2017, 9:20 AM

## 2017-03-18 NOTE — Progress Notes (Signed)
PROGRESS NOTE    [name] [name]  AYT:[id number] DOB: 11-Sep-1922 DOA: 03/16/2017 PCP: [name], MD   Outpatient Specialists:     Brief Narrative:  [name] [name] is a 83 y.o. 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  Patient admits to some memory problems and is vague when discussing timing of onset of symptoms.  She started having some middle of the back pains several weeks ago.  Separately, on Saturday, she had an episode of severe retrosternal chest pain that occurred while she was leaning over.  The pain was 9/10 and may have radiated to her back.  She had associated SOB and lightheadedness.  Pain was pleuritic. Symptoms lasted 3-5 minutes and resolved.  She states pain has recurred several more times but does not appear to be brought on by walking with her walker.  Pain occurred when she was turning around on the toilet to reach the toilet paper.  She has chronic swelling which initially she told me was worse than usual, but later she stated it was the same as usual.    ED Course: Vital signs stable except O2 dipped to 87% on room air and she was started on 2L [place].  Labs:  Troponin negative.  ECG:  A-fib with RBBB, no acute changes compared to 2017.  CXR with no acute disease, stable cardiomegaly and coursened interstitial markings.  CT c/a/p for aortic dissection demonstrated a 4.6cm ascending aortic aneurysm without evidence of dissection, 3 vessel coronary artery calcifications, cardiac enlargement and evidence of pulmonary hypertension.  Her renal mass appears stable compared to  CT 11/06/2015.     Assessment & Plan:   Principal Problem:   Chest pain Active Problems:   RBBB   Ascending aortic aneurysm (HCC)   Aortic insufficiency   Bilateral lower extremity edema   Atrial fibrillation, chronic (HCC)   Non-ST elevation (NSTEMI) myocardial infarction [facility])   Chest pain,  Seen by cardiology -going to cath labs  Lower extremity edema with pulmonary artery dilation and known AI.  Although CXR does not demonstrated edema, may have some acute on chronic diastolic or systolic heart failure contributing to her symptoms -  down 1.1L  Ascending aortic aneurysm 4.6cm, not a good surgical candidate  Iron and vitamin b12 deficiency anemia -  Continue outpatinet IV iron infusion -  Continue oral vitamin b12  Chronic atrial fibrillation, CHADs2vasc = 5  Should be on A/C, but may not be due to her chornic iron deficiency anemia from GI losses (hemorrhoids) which requires frequent IV iron infusions -  Defer anticoagulation for now pending work up for ACS, rate controlled  History of breast cancer and melanoma in remission and known right renal cell mass that appears stable.   DVT prophylaxis:  Fully anticoagulated   Code Status: DNR   Family Communication: At bedside  Disposition Plan:     Consultants:   cards     Subjective: Sleepy this AM  Objective: Vitals:   03/18/17 1416 03/18/17 1421 03/18/17 1425 03/18/17 1430  BP: 123/69 127/61 130/61 130/61  Pulse: 69 81 76 72  Resp: 15 17 (!) 24 (!) 27  Temp:      TempSrc:      SpO2: 99% 99% 100% 100%  Weight:      Height:        Intake/Output Summary (Last 24 hours) at 03/18/17 1446 Last data filed at 03/18/17 0300  Gross per 24 hour  Intake           132.73 ml  Output              700 ml  Net          -567.27 ml   Filed Weights   03/17/17 0423 03/18/17 0501  Weight: 75.7 kg (166 lb 14.2 oz) 77.5 kg (170 lb 13.7 oz)    Examination:  General exam: appears stated age    Respiratory system: Clear to auscultation Cardiovascular system: S1 & S2 heard, RRR No pedal edema. Gastrointestinal system: Abdomen is nondistended, soft and nontender. No organomegaly or masses felt. Normal bowel sounds heard. Central nervous system: alert. Extremities: Symmetric 5 x 5 power. Skin: No rashes, lesions or ulcers Psychiatry: pleasant and cooperative    Data Reviewed: I have personally reviewed following labs and imaging studies  CBC:  Recent Labs Lab 03/16/17 1215 03/18/17 0438  WBC 5.0 5.8  HGB 12.6 12.9  HCT 39.5 40.6  MCV 93.4 92.5  PLT 160 476   Basic Metabolic Panel:  Recent Labs Lab 03/16/17 1215 03/17/17 1055 03/18/17 0438  NA 141 141 142  K 3.3* 3.3* 3.8  CL 103 100* 102  CO2 29 31 30   GLUCOSE 104* 100* 99  BUN 12 12 15   CREATININE 1.09* 1.11* 1.24*  CALCIUM 8.4* 8.5* 8.4*  MG 2.2 2.1 1.9   GFR: Estimated Creatinine Clearance: 27.9 mL/min (A) (by C-G formula based on SCr of 1.24 mg/dL (H)). Liver Function Tests:  Recent Labs Lab 03/16/17 1215  AST 25  ALT 10*  ALKPHOS 62  BILITOT 1.2  PROT 6.6  ALBUMIN 3.7    Recent Labs Lab 03/16/17 1215  LIPASE 27   No results for input(s): AMMONIA in the last 168 hours. Coagulation Profile:  Recent Labs Lab 03/18/17 1048  INR 1.01   Cardiac Enzymes:  Recent Labs Lab 03/16/17 2343 03/17/17 0150 03/17/17 0406  TROPONINI 0.47* 0.64* 0.66*   BNP (last 3 results) No results for input(s): PROBNP in the last 8760 hours. HbA1C:  Recent Labs  03/17/17 0150  HGBA1C 5.5   CBG:  Recent Labs Lab 03/18/17 0759 03/18/17 1137  GLUCAP 99 94   Lipid Profile:  Recent Labs  03/17/17 0150  CHOL 189  HDL 55  LDLCALC 119*  TRIG 73  CHOLHDL 3.4   Thyroid Function Tests: No results for input(s): TSH, T4TOTAL, FREET4, T3FREE, THYROIDAB in the last 72 hours. Anemia Panel: No results for input(s): VITAMINB12, FOLATE, FERRITIN, TIBC, IRON, RETICCTPCT in the last 72  hours. Urine analysis:    Component Value Date/Time   COLORURINE YELLOW 05/18/2016 1529   APPEARANCEUR CLEAR 05/18/2016 1529   LABSPEC 1.025 05/18/2016 1529   PHURINE 6.0 05/18/2016 1529   GLUCOSEU NEGATIVE 05/18/2016 1529   HGBUR LARGE (A) 05/18/2016 1529   BILIRUBINUR SMALL (A) 05/18/2016 1529   KETONESUR NEGATIVE 05/18/2016 1529   PROTEINUR TRACE (A) 05/18/2016 1529   UROBILINOGEN 1.0 12/18/2014 1735   NITRITE NEGATIVE 05/18/2016 1529   LEUKOCYTESUR MODERATE (A) 05/18/2016 1529     ) Recent Results (from the past 240 hour(s))  Surgical PCR screen     Status: None   Collection Time:  03/18/17  7:03 AM  Result Value Ref Range Status   MRSA, PCR NEGATIVE NEGATIVE Final   Staphylococcus aureus NEGATIVE NEGATIVE Final    Comment:        The Xpert SA Assay (FDA approved for NASAL specimens in patients over 18 years of age), is one component of a comprehensive surveillance program.  Test performance has been validated by [facility] for patients greater than or equal to 11 year old. It is not intended to diagnose infection nor to guide or monitor treatment.       Anti-infectives    None       Radiology Studies: No results found.      Scheduled Meds: . [MAR Hold] carvedilol  3.125 mg Oral BID WC  . [MAR Hold] clopidogrel  75 mg Oral Q breakfast  . [MAR Hold] folic acid  1 mg Oral Daily  . [MAR Hold] furosemide  20 mg Intravenous BID  . [MAR Hold] gabapentin  200 mg Oral TID  . [MAR Hold] ondansetron  4 mg Oral QAC breakfast  . [MAR Hold] pantoprazole  40 mg Oral BID  . [MAR Hold] potassium chloride SA  40 mEq Oral BID  . [MAR Hold] prednisoLONE acetate  1 drop Both Eyes QID  . [MAR Hold] sodium chloride  1 drop Both Eyes TID  . [MAR Hold] cyanocobalamin  2,000 mcg Oral Daily   Continuous Infusions: . sodium chloride    . sodium chloride 75 mL/hr at 03/18/17 0954  . heparin Stopped (03/18/17 1325)  . heparin       LOS: 1 day    Time spent: 35  min    [place], DO [facility] Pager [phone]  If 7PM-7AM, please contact night-coverage [email or web address] Password [facility] 03/18/2017, 2:46 PM

## 2017-03-18 NOTE — Progress Notes (Signed)
ANTICOAGULATION CONSULT NOTE   Pharmacy Consult:  Heparin  Indication: chest pain/ACS  Allergies  Allergen Reactions  . Codeine Diarrhea and Nausea Only  . Tape Other (See Comments)    SKIN IS VERY THIN AND TEARS AND BRUISES EASILY; PLEASE USE COBAN WRAP!!  . Diona Fanti [Aspirin] Rash and Other (See Comments)    Petechia   . Iron Nausea And Vomiting    Oral iron causes nausea and vomiting  . Penicillins Rash    Has patient had a PCN reaction causing immediate rash, facial/tongue/throat swelling, SOB or lightheadedness with hypotension: Yes Has patient had a PCN reaction causing severe rash involving mucus membranes or skin necrosis: Unknown Has patient had a PCN reaction that required hospitalization: No Has patient had a PCN reaction occurring within the last 10 years: No If all of the above answers are "NO", then may proceed with Cephalosporin use.     Patient Measurements: Height: 5\' 2"  (157.5 cm) Weight: 170 lb 13.7 oz (77.5 kg) IBW/kg (Calculated) : 50.1  Heparin dosing weight = 67 kg   Vital Signs: Temp: 98.1 F (36.7 C) (08/15 0501) Temp Source: Oral (08/15 0501) BP: 107/58 (08/15 0501) Pulse Rate: 69 (08/15 0501)  Labs:  Recent Labs  03/16/17 1215 03/16/17 2343 03/17/17 0150 03/17/17 0406 03/17/17 1055 03/17/17 2016 03/18/17 0438 03/18/17 1048  HGB 12.6  --   --   --   --   --  12.9  --   HCT 39.5  --   --   --   --   --  40.6  --   PLT 160  --   --   --   --   --  154  --   LABPROT  --   --   --   --   --   --   --  13.3  INR  --   --   --   --   --   --   --  1.01  HEPARINUNFRC  --   --   --   --   --  0.32 0.35  --   CREATININE 1.09*  --   --   --  1.11*  --  1.24*  --   TROPONINI  --  0.47* 0.64* 0.66*  --   --   --   --     Estimated Creatinine Clearance: 27.9 mL/min (A) (by C-G formula based on SCr of 1.24 mg/dL (H)).    Assessment: 75 YOF continues on IV heparin for NSTEMI.  Heparin level is therapeutic; no bleeding reported.   Goal of  Therapy:  Heparin level 0.3-0.7 units/ml Monitor platelets by anticoagulation protocol: Yes    Plan:  Increase heparin gtt slightly to 1050 units/hr Daily heparin level and CBC F/U post cath    Hillery Bhalla D. Mina Marble, PharmD, BCPS Pager:  [phone] 03/18/2017, 12:43 PM

## 2017-03-18 NOTE — Progress Notes (Signed)
sats 88% on 2L nasal cannula during sleep, increased oxygen to 4L nasal cannula, sats sustaining 95%

## 2017-03-19 ENCOUNTER — Telehealth: Payer: Self-pay | Admitting: *Deleted

## 2017-03-19 DIAGNOSIS — I5181 Takotsubo syndrome: Secondary | ICD-10-CM

## 2017-03-19 DIAGNOSIS — R600 Localized edema: Secondary | ICD-10-CM

## 2017-03-19 DIAGNOSIS — I351 Nonrheumatic aortic (valve) insufficiency: Secondary | ICD-10-CM

## 2017-03-19 DIAGNOSIS — I482 Chronic atrial fibrillation: Secondary | ICD-10-CM

## 2017-03-19 DIAGNOSIS — E876 Hypokalemia: Secondary | ICD-10-CM

## 2017-03-19 DIAGNOSIS — R079 Chest pain, unspecified: Secondary | ICD-10-CM

## 2017-03-19 DIAGNOSIS — I712 Thoracic aortic aneurysm, without rupture: Secondary | ICD-10-CM

## 2017-03-19 LAB — CBC
HEMATOCRIT: 41.3 % (ref 36.0–46.0)
Hemoglobin: 12.7 g/dL (ref 12.0–15.0)
MCH: 29.7 pg (ref 26.0–34.0)
MCHC: 30.8 g/dL (ref 30.0–36.0)
MCV: 96.5 fL (ref 78.0–100.0)
PLATELETS: 147 10*3/uL — AB (ref 150–400)
RBC: 4.28 MIL/uL (ref 3.87–5.11)
RDW: 15 % (ref 11.5–15.5)
WBC: 5.9 10*3/uL (ref 4.0–10.5)

## 2017-03-19 LAB — BASIC METABOLIC PANEL
ANION GAP: 7 (ref 5–15)
BUN: 19 mg/dL (ref 6–20)
CALCIUM: 8.2 mg/dL — AB (ref 8.9–10.3)
CO2: 32 mmol/L (ref 22–32)
CREATININE: 1.16 mg/dL — AB (ref 0.44–1.00)
Chloride: 104 mmol/L (ref 101–111)
GFR, EST AFRICAN AMERICAN: 46 mL/min — AB (ref >60)
GFR, EST NON AFRICAN AMERICAN: 40 mL/min — AB (ref >60)
Glucose, Bld: 93 mg/dL (ref 65–99)
Potassium: 4.2 mmol/L (ref 3.5–5.1)
SODIUM: 143 mmol/L (ref 135–145)

## 2017-03-19 MED ORDER — TRAMADOL-ACETAMINOPHEN 37.5-325 MG PO TABS: 1.0000 | ORAL_TABLET | Freq: Four times a day (QID) | ORAL | 2 refills | Status: DC | PRN

## 2017-03-19 MED ORDER — POTASSIUM CHLORIDE CRYS ER 20 MEQ PO TBCR: 40.0000 meq | EXTENDED_RELEASE_TABLET | Freq: Every day | ORAL | 0 refills | Status: DC | PRN

## 2017-03-19 MED ORDER — FUROSEMIDE 20 MG PO TABS: 20.0000 mg | ORAL_TABLET | Freq: Every day | ORAL | 0 refills | Status: DC | PRN

## 2017-03-19 MED ORDER — CARVEDILOL 3.125 MG PO TABS: 3.1250 mg | ORAL_TABLET | Freq: Two times a day (BID) | ORAL | 0 refills | Status: DC

## 2017-03-19 MED ORDER — CLOPIDOGREL BISULFATE 75 MG PO TABS: 75.0000 mg | ORAL_TABLET | Freq: Every day | ORAL | Status: DC

## 2017-03-19 NOTE — Telephone Encounter (Signed)
Patient contacted regarding discharge from [facility] on Patient has not been discharged.  Patient understands to follow up with provider Ermalinda Barrios, PA-C on 04/01/17 at 1:30 pm at [place], [place]. Patient understands discharge instructions?pt  Has not been discharged. Explained to call office with any questions. Office number given.  Patient understands medications and regiment? Yes Patient understands to bring all medications to this visit? Yes

## 2017-03-19 NOTE — Discharge Summary (Addendum)
Physician Discharge Summary  MEYER DOCKERY QHU:[id number] DOB: 08-30-1922 DOA: 03/16/2017  PCP: [name], MD  Admit date: 03/16/2017 Discharge date: 03/19/2017   Recommendations for Outpatient Follow-Up:   Home with family supervision add ACEI/ARB when kidney function normalizes and as pressure tolerates. No NOACs as patient has h/o occult GI bleed needing IV Fe transfusion Lasix PRN until seen by cards   Discharge Diagnosis:   Principal Problem:   Chest pain Active Problems:   RBBB   Ascending aortic aneurysm (HCC)   Aortic insufficiency   Bilateral lower extremity edema   Atrial fibrillation, chronic (HCC)   Stress-induced cardiomyopathy   Discharge disposition:  Home with family supervision  Discharge Condition: Improved.  Diet recommendation: Low sodium, heart healthy  Wound care: None.   History of Present Illness:   [name] [name] is a 83 y.o. 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  Patient admits to some memory problems and is vague when discussing timing of onset of symptoms.  She started having some middle of the back pains several weeks ago.  Separately, on Saturday, she had an episode of severe retrosternal chest pain that occurred while she was leaning over.  The pain was 9/10 and may have radiated to her back.  She had associated SOB and lightheadedness.  Pain was pleuritic. Symptoms lasted 3-5 minutes and resolved.  She states pain has recurred several more times but does not appear to be brought on by walking with her walker.  Pain occurred  when she was turning around on the toilet to reach the toilet paper.  She has chronic swelling which initially she told me was worse than usual, but later she stated it was the same as usual.     Hospital Course by Problem:   Chest pain,  Seen by cardiology Left heart catheterization 03/18/17: 1.mild nonobstructive coronary artery disease. 2. Normal left ventricular end-diastolic pressure. Left ventricular angiography was not performed due to mildly elevated creatinine. Recommendations: Dr. Fletcher Anon reviewed the echocardiogram images and suspect that the patient has stress-induced cardiomyopathy. Continue treatment with carvedilol and consider adding a small dose ACE inhibitor/ARB if blood pressure tolerates and renal function remained stable. Consider switching Plavix to a NOAC given underlying atrial fibrillation.  Lower extremity edemawith pulmonary artery dilation and known AI. Although CXR does not demonstrated edema, may have some acute on chronic diastolic or systolic heart failure contributing to her symptoms - down 1.1L -PRN lasix and daily weights  Ascending aortic aneurysm4.6cm, not a good surgical candidate  Iron and vitamin b12 deficiency anemia - Continue outpatinet IV iron infusion - Continue oral vitamin b12  Chronic atrial fibrillation, CHADs2vasc = 5 Should be on A/C, but may not be due to her chornic iron deficiency anemia from GI losses (hemorrhoids) which requires frequent IV iron infusions  History of breast cancer and melanomain remission and known right renal cell mass that appears stable.     Medical Consultants:    cards   Discharge Exam:   Vitals:   03/19/17 4656 03/19/17 1334  BP: (!) 117/57 115/67  Pulse: 71 72  Resp:  15  Temp:  98.1 F (36.7 C)  SpO2:  99%   Vitals:   03/18/17 2044 03/19/17 0604 03/19/17 0854 03/19/17 1334  BP: 106/68 (!) 109/59 (!) 117/57 115/67  Pulse:  64 71 72  Resp: 18 (!) 22  15  Temp:    98.1 F  (36.7 C)  TempSrc:    Oral  SpO2:  96%  99%  Weight:  74.6 kg (164 lb 7.4 oz)    Height:        Gen:  NAD- walked in hallways well    The results of significant diagnostics from this hospitalization (including imaging, microbiology, ancillary and laboratory) are listed below for reference.     Procedures and Diagnostic Studies:   Dg Chest 2 View  Result Date: 03/16/2017 CLINICAL DATA:  83 year old female with chest pain and shortness of breath for 3 days. EXAM: CHEST  2 VIEW COMPARISON:  05/18/2016 FINDINGS: Cardiomediastinal silhouette is stable in size and configuration. Coarsened interstitial markings appear similar to prior examination. There is no focal airspace opacity, pleural effusion or pneumothorax. Chronic and degenerative osseous changes appear stable, including anterior wedge compression deformity of an upper vertebra. No acute osseous abnormality. IMPRESSION: 1. No active cardiopulmonary disease. 2. Stable cardiomegaly and coarsened interstitial markings. 3. Stable chronic and degenerative osseous changes. Electronically Signed   By: Kristopher Oppenheim M.D.   On: 03/16/2017 12:29   Ct Angio Chest/abd/pel For Dissection W And/or Wo Contrast  Result Date: 03/16/2017 CLINICAL DATA:  Chest pain radiating into back with some associated shortness of breath. EXAM: CT ANGIOGRAPHY CHEST, ABDOMEN AND PELVIS TECHNIQUE: Multidetector CT imaging through the chest, abdomen and pelvis was performed using the standard protocol during bolus administration of intravenous contrast. Multiplanar reconstructed images and MIPs were obtained and reviewed to evaluate the vascular anatomy. CONTRAST:  80 mL Isovue 370 IV COMPARISON:  CT of the abdomen and pelvis on 11/06/2015 FINDINGS: CTA CHEST FINDINGS Cardiovascular: There is stable dilatation of the ascending thoracic aorta which measures 4.6 cm in greatest diameter. The aorta measures approximately 4.3 cm at the level of the sinuses of Valsalva. The  proximal arch measures 3.6 cm. The distal arch measures 3.0 cm. The descending thoracic aorta measures 2.6 cm. No evidence of aortic dissection or intramural hemorrhage. No evidence of penetrating ulcer disease. The proximal great vessels show tortuosity without significant stenosis or aneurysmal disease. The heart is significantly enlarged. Calcified plaque is noted in the coronary tree in a 3 vessel distribution. There are some mild calcifications at the level of the aortic valve and mitral annulus. No pericardial fluid identified. Central pulmonary arteries are very prominent in caliber with the main pulmonary artery measuring approximately 4.3 cm in diameter. Findings are consistent with underlying pulmonary hypertension. Mediastinum/Nodes: No evidence of mediastinal, hilar or axillary lymphadenopathy. No mediastinal masses identified. No gross abnormalities involving the trachea or esophagus. Lungs/Pleura: Lungs demonstrate bilateral pulmonary scarring. No evidence of focal airspace consolidation, pulmonary edema or pleural effusions. No pulmonary masses or pneumothorax. Musculoskeletal: Bony structures are unremarkable. Review of the MIP images confirms the above findings. CTA ABDOMEN AND PELVIS FINDINGS VASCULAR Aorta: The abdominal aorta shows scattered plaque without evidence of aneurysm or dissection. Celiac: Origin stenosis of approximately 50%. SMA: Normally patent. Renals: Mild plaque at the origins of bilateral single renal arteries without evidence of stenosis. IMA: Normally patent. Inflow: Bilateral iliac arteries show moderate tortuosity and mild plaque without evidence of stenosis or aneurysmal disease. The common femoral arteries and femoral bifurcations are normally patent bilaterally. Review  of the MIP images confirms the above findings. NON-VASCULAR Hepatobiliary: No focal liver abnormality is seen. Status post cholecystectomy. No biliary dilatation. Pancreas: Unremarkable. No pancreatic ductal  dilatation or surrounding inflammatory changes. Spleen: Normal in size without focal abnormality. Adrenals/Urinary Tract: Small lateral interpolar cortical mass visualized on the arterial phase and measures approximately 1.2 cm. This shows no significant growth since the prior study in 2017. Stable cyst of the contralateral left kidney. Adrenal glands appear unremarkable. The bladder is decompressed. Stomach/Bowel: Bowel shows no evidence of obstruction or inflammation. Diverticulosis of the sigmoid colon noted without evidence of acute diverticulitis. Lymphatic: No enlarged lymph nodes identified. Reproductive: Status post hysterectomy. No adnexal masses. Other: No abdominal wall hernia or abnormality. No abdominopelvic ascites. Musculoskeletal: Bones are osteopenic. No fractures or lesions identified. Review of the MIP images confirms the above findings. IMPRESSION: 1. Stable aneurysmal disease of the ascending thoracic aorta which measures 4.6 cm in greatest diameter. There is no evidence of acute aortic dissection or other acute aortic syndrome. 2. Coronary atherosclerosis with calcified plaque in a 3 vessel distribution. 3. Significant cardiac enlargement without pulmonary edema. 4. Evidence of pulmonary hypertension with significant dilatation of the main pulmonary artery and central pulmonary arteries. 5. Stable roughly 1.2 cm right renal mass centered in the lateral interpolar cortex. This shows no significant growth since a prior CT on 11/06/2015. Electronically Signed   By: Aletta Edouard M.D.   On: 03/16/2017 14:25     Labs:   Basic Metabolic Panel:  Recent Labs Lab 03/16/17 1215 03/17/17 1055 03/18/17 0438 03/19/17 0347  NA 141 141 142 143  K 3.3* 3.3* 3.8 4.2  CL 103 100* 102 104  CO2 29 31 30  32  GLUCOSE 104* 100* 99 93  BUN 12 12 15 19   CREATININE 1.09* 1.11* 1.24* 1.16*  CALCIUM 8.4* 8.5* 8.4* 8.2*  MG 2.2 2.1 1.9  --    GFR Estimated Creatinine Clearance: 29.3 mL/min (A)  (by C-G formula based on SCr of 1.16 mg/dL (H)). Liver Function Tests:  Recent Labs Lab 03/16/17 1215  AST 25  ALT 10*  ALKPHOS 62  BILITOT 1.2  PROT 6.6  ALBUMIN 3.7    Recent Labs Lab 03/16/17 1215  LIPASE 27   No results for input(s): AMMONIA in the last 168 hours. Coagulation profile  Recent Labs Lab 03/18/17 1048  INR 1.01    CBC:  Recent Labs Lab 03/16/17 1215 03/18/17 0438 03/19/17 0347  WBC 5.0 5.8 5.9  HGB 12.6 12.9 12.7  HCT 39.5 40.6 41.3  MCV 93.4 92.5 96.5  PLT 160 154 147*   Cardiac Enzymes:  Recent Labs Lab 03/16/17 2343 03/17/17 0150 03/17/17 0406  TROPONINI 0.47* 0.64* 0.66*   BNP: Invalid input(s): POCBNP CBG:  Recent Labs Lab 03/18/17 0759 03/18/17 1137  GLUCAP 99 94   D-Dimer No results for input(s): DDIMER in the last 72 hours. Hgb A1c  Recent Labs  03/17/17 0150  HGBA1C 5.5   Lipid Profile  Recent Labs  03/17/17 0150  CHOL 189  HDL 55  LDLCALC 119*  TRIG 73  CHOLHDL 3.4   Thyroid function studies No results for input(s): TSH, T4TOTAL, T3FREE, THYROIDAB in the last 72 hours.  Invalid input(s): FREET3 Anemia work up No results for input(s): VITAMINB12, FOLATE, FERRITIN, TIBC, IRON, RETICCTPCT in the last 72 hours. Microbiology Recent Results (from the past 240 hour(s))  Surgical PCR screen     Status: None   Collection Time: 03/18/17  7:03 AM  Result Value Ref Range Status   MRSA, PCR NEGATIVE NEGATIVE Final   Staphylococcus aureus NEGATIVE NEGATIVE Final    Comment:        The Xpert SA Assay (FDA approved for NASAL specimens in patients over 52 years of age), is one component of a comprehensive surveillance program.  Test performance has been validated by [facility] for patients greater than or equal to 12 year old. It is not intended to diagnose infection nor to guide or monitor treatment.      Discharge Instructions:   Discharge Instructions    (HEART FAILURE PATIENTS) Call MD:   Anytime you have any of the following symptoms: 1) 3 pound weight gain in 24 hours or 5 pounds in 1 week 2) shortness of breath, with or without a dry hacking cough 3) swelling in the hands, feet or stomach 4) if you have to sleep on extra pillows at night in order to breathe.    Complete by:  As directed    Diet - low sodium heart healthy    Complete by:  As directed    Discharge instructions    Complete by:  As directed    Supervision by family   Increase activity slowly    Complete by:  As directed      Allergies as of 03/19/2017      Reactions   Codeine Diarrhea, Nausea Only   Tape Other (See Comments)   SKIN IS VERY THIN AND TEARS AND BRUISES EASILY; PLEASE USE COBAN WRAP!!   Asa [aspirin] Rash, Other (See Comments)   Petechia   Iron Nausea And Vomiting   Oral iron causes nausea and vomiting   Penicillins Rash   Has patient had a PCN reaction causing immediate rash, facial/tongue/throat swelling, SOB or lightheadedness with hypotension: Yes Has patient had a PCN reaction causing severe rash involving mucus membranes or skin necrosis: Unknown Has patient had a PCN reaction that required hospitalization: No Has patient had a PCN reaction occurring within the last 10 years: No If all of the above answers are "NO", then may proceed with Cephalosporin use.      Medication List    TAKE these medications   carvedilol 3.125 MG tablet Commonly known as:  COREG Take 1 tablet (3.125 mg total) by mouth 2 (two) times daily with a meal.   clopidogrel 75 MG tablet Commonly known as:  PLAVIX Take 75 mg by mouth daily with breakfast.   cyanocobalamin 2000 MCG tablet Commonly known as:  [employer] VITAMIN B12 Take 1 tablet (2,000 mcg total) by mouth daily.   erythromycin ophthalmic ointment as directed.   folic acid 1 MG tablet Commonly known as:  FOLVITE Take 1 tablet (1 mg total) by mouth daily.   furosemide 20 MG tablet Commonly known as:  LASIX Take 1 tablet (20 mg total) by mouth  daily as needed for edema. What changed:  when to take this  reasons to take this   gabapentin 100 MG capsule Commonly known as:  NEURONTIN TAKE 2 CAPSULES BY MOUTH 3  TIMES DAILY What changed:  See the new instructions.   hydrocortisone 25 MG suppository Commonly known as:  ANUSOL-HC Place 1 suppository (25 mg total) rectally every 12 (twelve) hours. What changed:  when to take this  reasons to take this   ondansetron 4 MG tablet Commonly known as:  ZOFRAN Take 1 tablet (4 mg total) by mouth every 8 (eight) hours as needed for nausea or vomiting. What changed:  when to take this   pantoprazole 40 MG tablet Commonly known as:  PROTONIX Take 1 tablet (40 mg total) by mouth 2 (two) times daily.   potassium chloride SA 20 MEQ tablet Commonly known as:  K-DUR,KLOR-CON Take 2 tablets (40 mEq total) by mouth daily as needed (when taking lasix). What changed:  when to take this  reasons to take this   prednisoLONE acetate 1 % ophthalmic suspension Commonly known as:  PRED FORTE Place 1 drop into both eyes 4 (four) times daily.   PROAIR HFA 108 (90 Base) MCG/ACT inhaler Generic drug:  albuterol Inhale 1-2 puffs into the lungs every 6 (six) hours as needed for wheezing or shortness of breath.   psyllium 58.6 % packet Commonly known as:  METAMUCIL Take 1 packet by mouth daily as needed (for constipation).   sodium chloride 2 % ophthalmic solution Commonly known as:  MURO 128 Place 1 drop into both eyes 3 (three) times daily.   traMADol-acetaminophen 37.5-325 MG tablet Commonly known as:  ULTRACET Take 1 tablet by mouth every 6 (six) hours as needed for moderate pain or severe pain.      Follow-up Information    Imogene Burn, PA-C Follow up on 04/01/2017.   Specialty:  Cardiology Why:  1:30 pm for TCM Contact information: [place] [address] [phone]        [name], MD Follow up in 1 week(s).   Specialty:  Family  Medicine Contact information: [address] [place] [address] [phone]            Time coordinating discharge: 35 min  Signed:  Amal Renbarger Alison Stalling   [facility] 03/19/2017, 4:07 PM

## 2017-03-19 NOTE — Progress Notes (Signed)
Progress Note  Patient Name: [name] [name] Date of Encounter: 03/19/2017  Primary Cardiologist: Dr. Bronson Ing  Subjective   Patient is feeling well; denies chest pain, SOB, and palpitations. Left radial site clean, dry,and intact without bleeding.  Inpatient Medications    Scheduled Meds: . carvedilol  3.125 mg Oral BID WC  . folic acid  1 mg Oral Daily  . gabapentin  200 mg Oral TID  . ondansetron  4 mg Oral QAC breakfast  . pantoprazole  40 mg Oral BID  . potassium chloride SA  40 mEq Oral BID  . prednisoLONE acetate  1 drop Both Eyes QID  . sodium chloride  1 drop Both Eyes TID  . sodium chloride flush  3 mL Intravenous Q12H  . cyanocobalamin  2,000 mcg Oral Daily   Continuous Infusions: . sodium chloride     PRN Meds: sodium chloride, acetaminophen, albuterol, gi cocktail, hydrocortisone, ondansetron (ZOFRAN) IV, sodium chloride flush, traMADol-acetaminophen   Vital Signs    Vitals:   03/18/17 2016 03/18/17 2044 03/19/17 0604 03/19/17 0854  BP: (!) 105/55 106/68 (!) 109/59 (!) 117/57  Pulse:   64 71  Resp: 20 18 (!) 22   Temp: 98.1 F (36.7 C)     TempSrc: Oral     SpO2:   96%   Weight:   164 lb 7.4 oz (74.6 kg)   Height:        Intake/Output Summary (Last 24 hours) at 03/19/17 1139 Last data filed at 03/19/17 0700  Gross per 24 hour  Intake          1115.42 ml  Output             1100 ml  Net            15.42 ml   Filed Weights   03/17/17 0423 03/18/17 0501 03/19/17 0604  Weight: 166 lb 14.2 oz (75.7 kg) 170 lb 13.7 oz (77.5 kg) 164 lb 7.4 oz (74.6 kg)     Physical Exam   General: Well developed, well nourished, female appearing in no acute distress. Head: Normocephalic, atraumatic.  Neck: Supple without bruits, JVD Lungs:  Resp regular and unlabored, CTA. Heart: Irregular rhythm, regular rate, no murmur; no rub. Abdomen: Soft, non-tender, non-distended with normoactive bowel sounds. No hepatomegaly. No rebound/guarding. No obvious  abdominal masses. Extremities: No clubbing, cyanosis, 1+ edema. Distal pedal pulses are 2+ bilaterally. Neuro: Alert and oriented X 3. Moves all extremities spontaneously. Psych: Normal affect.  Labs    Chemistry Recent Labs Lab 03/16/17 1215 03/17/17 1055 03/18/17 0438 03/19/17 0347  NA 141 141 142 143  K 3.3* 3.3* 3.8 4.2  CL 103 100* 102 104  CO2 29 31 30  32  GLUCOSE 104* 100* 99 93  BUN 12 12 15 19   CREATININE 1.09* 1.11* 1.24* 1.16*  CALCIUM 8.4* 8.5* 8.4* 8.2*  PROT 6.6  --   --   --   ALBUMIN 3.7  --   --   --   AST 25  --   --   --   ALT 10*  --   --   --   ALKPHOS 62  --   --   --   BILITOT 1.2  --   --   --   GFRNONAA 43* 42* 37* 40*  GFRAA 49* 48* 42* 46*  ANIONGAP 9 10 10 7      Hematology Recent Labs Lab 03/16/17 1215 03/18/17 0438 03/19/17 0347  WBC 5.0 5.8  5.9  RBC 4.23 4.39 4.28  HGB 12.6 12.9 12.7  HCT 39.5 40.6 41.3  MCV 93.4 92.5 96.5  MCH 29.8 29.4 29.7  MCHC 31.9 31.8 30.8  RDW 14.8 14.5 15.0  PLT 160 154 147*    Cardiac Enzymes Recent Labs Lab 03/16/17 2343 03/17/17 0150 03/17/17 0406  TROPONINI 0.47* 0.64* 0.66*    Recent Labs Lab 03/16/17 1232  TROPIPOC 0.02     BNPNo results for input(s): BNP, PROBNP in the last 168 hours.   DDimer No results for input(s): DDIMER in the last 168 hours.   Radiology    No results found.   Telemetry    Afib rate controlled - Personally Reviewed  ECG    No new tracings - Personally Reviewed   Cardiac Studies   Left heart catheterization 03/18/17: 1.mild nonobstructive coronary artery disease. 2. Normal left ventricular end-diastolic pressure. Left ventricular angiography was not performed due to mildly elevated creatinine.  Recommendations: I reviewed the echocardiogram images and I suspect that the patient has stress-induced cardiomyopathy. Continue treatment with carvedilol and consider adding a small dose ACE inhibitor/ARB if blood pressure tolerates and renal function  remained stable. Consider switching Plavix to a NOAC given underlying atrial fibrillation.   Echocardiogram 03/17/17: Study Conclusions - Left ventricle: The cavity size was normal. Systolic function was   moderately reduced. The estimated ejection fraction was in the   range of 35% to 40%. Dyskinesis of the mid-apicalanteroseptal and   anterior myocardium; consistent with infarction in the   distribution of the left anterior descending coronary artery. No   evidence of thrombus. - Aortic valve: There was mild to moderate regurgitation directed   towards the mitral anterior leaflet. - Ascending aorta: The ascending aorta was mildly dilated. - Mitral valve: Calcified annulus. There was mild to moderate   regurgitation directed centrally. - Left atrium: The atrium was moderately to severely dilated. - Right atrium: The atrium was mildly dilated. - Tricuspid valve: There was moderate regurgitation. - Pulmonary arteries: Systolic pressure was mildly increased. PA   peak pressure: 42 mm Hg (S).  Patient Profile     83 y.o. female with a hx of venous stasis and edema, HTN, DCIS of right breast s/p surgical resection, renal mass thought to be consistent with RCC, chronic Afib with RBBB not on anticoagulation, ascending aortic aneurysm, and aortic insufficiency and  who is being seen for the evaluation of chest pain.  Assessment & Plan    1. Chest pain - heart cath yesterday with non-osbtructive disease - cardiomyopathy is likely stress-induced in a manner consistent with takotsubo - continue coreg, add ACEI/ARB when kidney function normalizes and as pressure tolerates - continue home lasix 20 mg daily, please provide script for extra 20 mg Lasix PRN for weight gain greater than 3 lbs in one day or 5 lbs in 1 week. I discussed the importance of daily weights - no ASA given intolerance, no indication at this time - follow up with OP echo in 3 months - follow up appt scheduled  2. AKI -  likely secondary to dehydration - sCr improving after cath 1.16 - will hold off on starting ACEI/ARB today - continue trending kidney function  3. Hx of TIA - home plavix, continued   4. Paroxysmal Afib - chronic arrhythmia - rate-controlled  - no AC for hx of occult GI bleed, iron deficiency anemia, and IV iron supplementation  Signed, Ledora Bottcher , PA-C 11:39 AM 03/19/2017 Pager: [phone]  The  patient was seen, examined and discussed with Minette Brine , PA-C and I agree with the above.   83 y.o. female with a hx of venous stasis and edema, HTN, DCIS of right breast s/p surgical resection, renal mass thought to be consistent with RCC, chronic Afib with RBBB not on anticoagulation, ascending aortic aneurysm, and aortic insufficiency and  who is being seen for the evaluation of chest pain. Cath showed non-osbtructive disease, cardiomyopathy is likely stress-induced in a manner consistent with takotsubo. Continue coreg, add ACEI/ARB when kidney function normalizes and as pressure tolerates. Crea improved, ready for discharge,insertion site no bluit or bleeding. Follow up arranged with Dr Bronson Ing in [place].  Ena Dawley, MD 03/19/2017

## 2017-03-19 NOTE — Plan of Care (Signed)
Problem: Safety: Goal: Ability to remain free from injury will improve Outcome: Progressing Fall risk bundle in place per policy. No falls, injuries or skin breakdown this shift.   Problem: Skin Integrity: Goal: Risk for impaired skin integrity will decrease Outcome: Progressing Purewick in place due to incontinence and to prevent moisture associated skin breakdown. No skin breakdown present this shift. Pt Cath sit remains level 1 without change. VS stable.

## 2017-03-19 NOTE — Discharge Instructions (Addendum)
No driving for 2 days. No lifting over 5 lbs for 1 week. No sexual activity for 1 week. Keep procedure site clean & dry. If you notice increased pain, swelling, bleeding or pus, call/return!  You may shower, but no soaking baths/hot tubs/pools for 1 week.    Heart-Healthy Eating Plan Heart-healthy meal planning includes:  Limiting unhealthy fats.  Increasing healthy fats.  Making other small dietary changes.  You may need to talk with your doctor or a diet specialist (dietitian) to create an eating plan that is right for you. What types of fat should I choose?  Choose healthy fats. These include olive oil and canola oil, flaxseeds, walnuts, almonds, and seeds.  Eat more omega-3 fats. These include salmon, mackerel, sardines, tuna, flaxseed oil, and ground flaxseeds. Try to eat fish at least twice each week.  Limit saturated fats. ? Saturated fats are often found in animal products, such as meats, butter, and cream. ? Plant sources of saturated fats include palm oil, palm kernel oil, and coconut oil.  Avoid foods with partially hydrogenated oils in them. These include stick margarine, some tub margarines, cookies, crackers, and other baked goods. These contain trans fats. What general guidelines do I need to follow?  Check food labels carefully. Identify foods with trans fats or high amounts of saturated fat.  Fill one half of your plate with vegetables and green salads. Eat 4-5 servings of vegetables per day. A serving of vegetables is: ? 1 cup of raw leafy vegetables. ?  cup of raw or cooked cut-up vegetables. ?  cup of vegetable juice.  Fill one fourth of your plate with whole grains. Look for the word "whole" as the first word in the ingredient list.  Fill one fourth of your plate with lean protein foods.  Eat 4-5 servings of fruit per day. A serving of fruit is: ? One medium whole fruit. ?  cup of dried fruit. ?  cup of fresh, frozen, or canned fruit. ?  cup of 100%  fruit juice.  Eat more foods that contain soluble fiber. These include apples, broccoli, carrots, beans, peas, and barley. Try to get 20-30 g of fiber per day.  Eat more home-cooked food. Eat less restaurant, buffet, and fast food.  Limit or avoid alcohol.  Limit foods high in starch and sugar.  Avoid fried foods.  Avoid frying your food. Try baking, boiling, grilling, or broiling it instead. You can also reduce fat by: ? Removing the skin from poultry. ? Removing all visible fats from meats. ? Skimming the fat off of stews, soups, and gravies before serving them. ? Steaming vegetables in water or broth.  Lose weight if you are overweight.  Eat 4-5 servings of nuts, legumes, and seeds per week: ? One serving of dried beans or legumes equals  cup after being cooked. ? One serving of nuts equals 1 ounces. ? One serving of seeds equals  ounce or one tablespoon.  You may need to keep track of how much salt or sodium you eat. This is especially true if you have high blood pressure. Talk with your doctor or dietitian to get more information. What foods can I eat? Grains Breads, including [place], white, pita, wheat, raisin, rye, oatmeal, and [place]. Tortillas that are neither fried nor made with lard or trans fat. Low-fat rolls, including hotdog and hamburger buns and English muffins. Biscuits. Muffins. Waffles. Pancakes. Light popcorn. Whole-grain cereals. Flatbread. Melba toast. Pretzels. Breadsticks. Rusks. Low-fat snacks. Low-fat crackers, including oyster,  saltine, matzo, graham, animal, and rye. Rice and pasta, including brown rice and pastas that are made with whole wheat. Vegetables All vegetables. Fruits All fruits, but limit coconut. Meats and Other Protein Sources Lean, well-trimmed beef, veal, pork, and lamb. Chicken and [place] without skin. All fish and shellfish. Wild duck, rabbit, pheasant, and venison. Egg whites or low-cholesterol egg substitutes. Dried beans, peas,  lentils, and tofu. Seeds and most nuts. Dairy Low-fat or nonfat cheeses, including ricotta, string, and mozzarella. Skim or 1% milk that is liquid, powdered, or evaporated. Buttermilk that is made with low-fat milk. Nonfat or low-fat yogurt. Beverages Mineral water. Diet carbonated beverages. Sweets and Desserts Sherbets and fruit ices. Honey, jam, marmalade, jelly, and syrups. Meringues and gelatins. Pure sugar candy, such as hard candy, jelly beans, gumdrops, mints, marshmallows, and small amounts of dark chocolate. [employer] Eat all sweets and desserts in moderation. Fats and Oils Nonhydrogenated (trans-free) margarines. Vegetable oils, including soybean, sesame, sunflower, olive, peanut, safflower, corn, canola, and cottonseed. Salad dressings or mayonnaise made with a vegetable oil. Limit added fats and oils that you use for cooking, baking, salads, and as spreads. Other Cocoa powder. Coffee and tea. All seasonings and condiments. The items listed above may not be a complete list of recommended foods or beverages. Contact your dietitian for more options. What foods are not recommended? Grains Breads that are made with saturated or trans fats, oils, or whole milk. Croissants. Butter rolls. Cheese breads. Sweet rolls. Donuts. Buttered popcorn. Chow mein noodles. High-fat crackers, such as cheese or butter crackers. Meats and Other Protein Sources Fatty meats, such as hotdogs, short ribs, sausage, spareribs, bacon, rib eye roast or steak, and mutton. High-fat deli meats, such as salami and bologna. Caviar. Domestic duck and goose. Organ meats, such as kidney, liver, sweetbreads, and heart. Dairy Cream, sour cream, cream cheese, and creamed cottage cheese. Whole-milk cheeses, including blue (bleu), Monterey Jack, [place], Orange, American, [place], Swiss, cheddar, [place], and [place]. Whole or 2% milk that is liquid, evaporated, or condensed. Whole buttermilk. Cream sauce or high-fat  cheese sauce. Yogurt that is made from whole milk. Beverages Regular sodas and juice drinks with added sugar. Sweets and Desserts Frosting. Pudding. Cookies. Cakes other than angel food cake. Candy that has milk chocolate or white chocolate, hydrogenated fat, butter, coconut, or unknown ingredients. Buttered syrups. Full-fat ice cream or ice cream drinks. Fats and Oils Gravy that has suet, meat fat, or shortening. Cocoa butter, hydrogenated oils, palm oil, coconut oil, palm kernel oil. These can often be found in baked products, candy, fried foods, nondairy creamers, and whipped toppings. Solid fats and shortenings, including bacon fat, salt pork, lard, and butter. Nondairy cream substitutes, such as coffee creamers and sour cream substitutes. Salad dressings that are made of unknown oils, cheese, or sour cream. The items listed above may not be a complete list of foods and beverages to avoid. Contact your dietitian for more information. This information is not intended to replace advice given to you by your health care provider. Make sure you discuss any questions you have with your health care provider. Document Released: 01/20/2012 Document Revised: 12/27/2015 Document Reviewed: 01/12/2014 Elsevier Interactive Patient Education  [employer].

## 2017-03-30 ENCOUNTER — Telehealth: Payer: Self-pay | Admitting: Internal Medicine

## 2017-03-30 NOTE — Telephone Encounter (Signed)
pts daughter- Joaquim Lai is aware.

## 2017-03-30 NOTE — Telephone Encounter (Signed)
Spoke with the pts daughterJoaquim Lai, pt was scheduled for a tcs but had to cancel it d/t some health problems. She also had a heart attack 2 weeks ago and noticed some BRBPR, sometimes its just a small amount of blood but sometimes its a lot. Pt has been using suppositories for about a week and it has not helped much. Pt is weak, no dizziness or lightheadedness, just feels really tired. Going to see cardiologist on Wednesday.  Daughter wanted to know if there was anything they can do?

## 2017-03-30 NOTE — Telephone Encounter (Signed)
Following up with cardiologist is top priority. Keep a check on rectal bleeding. Let us know if it worsens.

## 2017-03-30 NOTE — Telephone Encounter (Signed)
[phone]  PATIENT DAUGHTER, [name] [name], CALLED AND STATED THAT HER MOTHER IS BLEEDING FROM HER HEMORRHOIDS AND HAS SOME CONCERNS.  SHE HAS ALSO HAD A HEART ATTACK IN THE RECENT PAST.  PLEASE CALL

## 2017-04-01 ENCOUNTER — Ambulatory Visit (INDEPENDENT_AMBULATORY_CARE_PROVIDER_SITE_OTHER): Payer: Medicare Other | Admitting: Physician Assistant

## 2017-04-01 ENCOUNTER — Encounter: Payer: Self-pay | Admitting: Physician Assistant

## 2017-04-01 ENCOUNTER — Other Ambulatory Visit (HOSPITAL_COMMUNITY)
Admission: RE | Admit: 2017-04-01 | Discharge: 2017-04-01 | Disposition: A | Discharge status: Home / Self Care | Payer: Medicare Other | Source: Ambulatory Visit | Attending: Physician Assistant | Admitting: Physician Assistant

## 2017-04-01 VITALS — BP 102/50 | HR 68 | Ht 65.0 in | Wt 169.0 lb

## 2017-04-01 DIAGNOSIS — Z66 Do not resuscitate: Secondary | ICD-10-CM | POA: Diagnosis not present

## 2017-04-01 DIAGNOSIS — I214 Non-ST elevation (NSTEMI) myocardial infarction: Secondary | ICD-10-CM | POA: Diagnosis not present

## 2017-04-01 DIAGNOSIS — I5023 Acute on chronic systolic (congestive) heart failure: Secondary | ICD-10-CM | POA: Diagnosis not present

## 2017-04-01 DIAGNOSIS — R0602 Shortness of breath: Secondary | ICD-10-CM | POA: Diagnosis not present

## 2017-04-01 DIAGNOSIS — I482 Chronic atrial fibrillation, unspecified: Secondary | ICD-10-CM

## 2017-04-01 DIAGNOSIS — I5021 Acute systolic (congestive) heart failure: Secondary | ICD-10-CM | POA: Diagnosis not present

## 2017-04-01 DIAGNOSIS — I130 Hypertensive heart and chronic kidney disease with heart failure and stage 1 through stage 4 chronic kidney disease, or unspecified chronic kidney disease: Secondary | ICD-10-CM | POA: Diagnosis not present

## 2017-04-01 DIAGNOSIS — I700 Atherosclerosis of aorta: Secondary | ICD-10-CM | POA: Diagnosis not present

## 2017-04-01 DIAGNOSIS — Z87891 Personal history of nicotine dependence: Secondary | ICD-10-CM | POA: Diagnosis not present

## 2017-04-01 DIAGNOSIS — I712 Thoracic aortic aneurysm, without rupture: Secondary | ICD-10-CM

## 2017-04-01 DIAGNOSIS — I5181 Takotsubo syndrome: Secondary | ICD-10-CM | POA: Insufficient documentation

## 2017-04-01 DIAGNOSIS — I110 Hypertensive heart disease with heart failure: Secondary | ICD-10-CM | POA: Diagnosis not present

## 2017-04-01 DIAGNOSIS — D509 Iron deficiency anemia, unspecified: Secondary | ICD-10-CM

## 2017-04-01 DIAGNOSIS — I2510 Atherosclerotic heart disease of native coronary artery without angina pectoris: Secondary | ICD-10-CM | POA: Diagnosis not present

## 2017-04-01 DIAGNOSIS — J9601 Acute respiratory failure with hypoxia: Secondary | ICD-10-CM | POA: Diagnosis not present

## 2017-04-01 DIAGNOSIS — R079 Chest pain, unspecified: Secondary | ICD-10-CM | POA: Diagnosis not present

## 2017-04-01 DIAGNOSIS — I82811 Embolism and thrombosis of superficial veins of right lower extremities: Secondary | ICD-10-CM | POA: Diagnosis not present

## 2017-04-01 DIAGNOSIS — I7121 Aneurysm of the ascending aorta, without rupture: Secondary | ICD-10-CM

## 2017-04-01 LAB — CBC WITH DIFFERENTIAL/PLATELET
Basophils Absolute: 0 10*3/uL (ref 0.0–0.1)
Basophils Relative: 0 %
EOS ABS: 0.1 10*3/uL (ref 0.0–0.7)
Eosinophils Relative: 3 %
HCT: 34.2 % — ABNORMAL LOW (ref 36.0–46.0)
HEMOGLOBIN: 10.9 g/dL — AB (ref 12.0–15.0)
LYMPHS ABS: 1.5 10*3/uL (ref 0.7–4.0)
LYMPHS PCT: 31 %
MCH: 31 pg (ref 26.0–34.0)
MCHC: 31.9 g/dL (ref 30.0–36.0)
MCV: 97.2 fL (ref 78.0–100.0)
Monocytes Absolute: 0.4 10*3/uL (ref 0.1–1.0)
Monocytes Relative: 8 %
NEUTROS PCT: 58 %
Neutro Abs: 2.8 10*3/uL (ref 1.7–7.7)
Platelets: 165 10*3/uL (ref 150–400)
RBC: 3.52 MIL/uL — AB (ref 3.87–5.11)
RDW: 15.3 % (ref 11.5–15.5)
WBC: 4.9 10*3/uL (ref 4.0–10.5)

## 2017-04-01 LAB — BASIC METABOLIC PANEL
ANION GAP: 7 (ref 5–15)
BUN: 19 mg/dL (ref 6–20)
CHLORIDE: 104 mmol/L (ref 101–111)
CO2: 28 mmol/L (ref 22–32)
CREATININE: 1.3 mg/dL — AB (ref 0.44–1.00)
Calcium: 8.3 mg/dL — ABNORMAL LOW (ref 8.9–10.3)
GFR calc non Af Amer: 35 mL/min — ABNORMAL LOW (ref >60)
GFR, EST AFRICAN AMERICAN: 40 mL/min — AB (ref >60)
Glucose, Bld: 122 mg/dL — ABNORMAL HIGH (ref 65–99)
POTASSIUM: 4.1 mmol/L (ref 3.5–5.1)
SODIUM: 139 mmol/L (ref 135–145)

## 2017-04-01 NOTE — Patient Instructions (Signed)
Medication Instructions:  Your physician recommends that you continue on your current medications as directed. Please refer to the Current Medication list given to you today.   Labwork: Your physician recommends that you return for lab work in: Today    Testing/Procedures: NONE  Follow-Up: Your physician recommends that you schedule a follow-up appointment in: 1 Month   Any Other Special Instructions Will Be Listed Below (If Applicable). You may take an extra Lasix and Potassium today      If you need a refill on your cardiac medications before your next appointment, please call your pharmacy.  Thank you for choosing [facility]!

## 2017-04-01 NOTE — Progress Notes (Signed)
Cardiology Office Note    Date:  04/01/2017   ID:  [name] [name], [name] [name] [birth year], MRN [id number]  PCP:  [name], MD  Cardiologist: Dr. Bronson Ing  Chief Complaint  Patient presents with  . Follow-up    History of Present Illness:  [name] [name] is a 83 y.o. 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 She was placed on Coreg and the addition of ACE inhibitor/ARB was recommended of kidney function normalized him a pressure would tolerate.  Patient comes in today accompanied by her 2 daughters. She is very hard of hearing. She has been very weak since hospitalization. Her legs began to swell yesterday. She is having a lot of food brought in to her because she still lives alone. She also get Meals on Wheels daily and goes out to eat for lunch every day. She has occasional dyspnea on exertion. She is asking how much housework she can do. She also is having a lot of bleeding from her hemorrhoids. She has a history of iron deficiency anemia and usually gets shots.     Past Medical History:  Diagnosis Date  . Aneurysm of aorta (HCC)   . Aneurysm, thoracic aortic ([place])   . Aortic insufficiency   . Ascending aortic aneurysm ([place])    CT in 2012 - 4.2x4.2cm  . Atrial fibrillation ([place])   . Bladder infection    h/o  . 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 Dehydration    HISTORY   . Fall at home 09/10/12  . Hemorrhoids   . Hip fracture ([place])    hip surgery 2001  .  History of blood clots    in leg  . History of knee surgery   . Hypertension   . IDA (iron deficiency anemia) 06/07/2013  . Iron deficiency anemia, unspecified 03/14/2013   secondary to GI blood loss  . Kidney infection    h/o  . Melanoma of skin ([place]) 03/14/2013  . Melanoma of thigh ([place])    left  . Mini stroke ([place])   . S/P colonoscopy March 2010   RMR: friable anal canal hemorrhoids, hyperplastic ascending polyp, adenomatous descending polyp   . Stomach ulcer    secondary to h.pylori, s/p treatment  . Thyroid condition   . Venous stasis    edema    Past Surgical History:  Procedure Laterality Date  . APPENDECTOMY  1942  . BREAST LUMPECTOMY  1998  . CATARACT EXTRACTION, BILATERAL    . CHOLECYSTECTOMY    . COLONOSCOPY  06/05/11   pancolonic diverticulosis/ileal reosion/abnormal anorectal junction s/p biopsy: path for small intestine and Ti was benign with non-villous atrophy, rectal biopsy with prominent prolapse changes, no acute inflammation  . CORNEAL TRANSPLANT Bilateral 2013  . ENTEROSCOPY N/A 06/13/2013   NFA:OZHY Gastritis/GI BLEED MOST LIKELY DUE TO DUODENAL ULCERS, AND ? AVMs  . ESOPHAGOGASTRODUODENOSCOPY  06/05/11   small hiatal hernia; + H.PYLORI GASTRITIS, s/p 5 days of Pylera, unable to finish due  to N/V  . ESOPHAGOGASTRODUODENOSCOPY N/A 06/13/2013   Dr. Oneida Alar- see enteroscopy  . ESOPHAGOGASTRODUODENOSCOPY (EGD) WITH ESOPHAGEAL DILATION N/A 06/08/2013   YIF:OYDXA hiatal hernia;  otherwise normal EGD s/p dilation  . GIVENS CAPSULE STUDY N/A 06/08/2013   Procedure: GIVENS CAPSULE STUDY;  Surgeon: Daneil Dolin, MD;  [facility];  Service: Endoscopy;  Laterality: N/A;  . HEMORRHOID BANDING    . KNEE SURGERY Right 05/2006   total right  . LEFT HEART CATH AND CORONARY ANGIOGRAPHY N/A 03/18/2017   Procedure: LEFT HEART CATH AND CORONARY ANGIOGRAPHY;  Surgeon: Wellington Hampshire, MD;  Location: [facility];  Service: Cardiovascular;  Laterality: N/A;    . LEG SURGERY    . MELANOMA EXCISION Left 08/2006   left leg excision  . NM MYOCAR PERF WALL MOTION  03/27/2010   dipyridamole; small reversible basal to mid-septal defect (?artifact), post-stress EF 55%, low risk scan   . PARTIAL HYSTERECTOMY  1976  . TONSILLECTOMY    . TOTAL HIP ARTHROPLASTY Left 2001&2004    X 2 FOR LEFT HIP  . TRANSTHORACIC ECHOCARDIOGRAM  10/06/2012   EF 55-60%, mod eccentric hypertrophy, grade 2 diastolic dysfunction; mildly calcifed AV annulus with moderate regurg; aortic root mildly dilated; LA severely dailted; PA peak pressure 28mg  . VARICOSE VEIN SURGERY      Current Medications: Current Meds  Medication Sig  . albuterol (PROAIR HFA) 108 (90 Base) MCG/ACT inhaler Inhale 1-2 puffs into the lungs every 6 (six) hours as needed for wheezing or shortness of breath.  . carvedilol (COREG) 3.125 MG tablet Take 1 tablet (3.125 mg total) by mouth 2 (two) times daily with a meal.  . clopidogrel (PLAVIX) 75 MG tablet Take 75 mg by mouth daily with breakfast.  . cyanocobalamin ([employer] VITAMIN B12) 2000 MCG tablet Take 1 tablet (2,000 mcg total) by mouth daily.  .Marland Kitchenerythromycin ophthalmic ointment as directed.   . folic acid (FOLVITE) 1 MG tablet Take 1 tablet (1 mg total) by mouth daily.  . furosemide (LASIX) 20 MG tablet Take 1 tablet (20 mg total) by mouth daily as needed for edema.  . gabapentin (NEURONTIN) 100 MG capsule TAKE 2 CAPSULES BY MOUTH 3  TIMES DAILY (Patient taking differently: Take 200 mg by mouth three times a day)  . hydrocortisone (ANUSOL-HC) 25 MG suppository Place 1 suppository (25 mg total) rectally every 12 (twelve) hours. (Patient taking differently: Place 25 mg rectally 2 (two) times daily as needed for hemorrhoids or itching. )  . ondansetron (ZOFRAN) 4 MG tablet Take 1 tablet (4 mg total) by mouth every 8 (eight) hours as needed for nausea or vomiting. (Patient taking differently: Take 4 mg by mouth daily before breakfast. )  . pantoprazole (PROTONIX)  40 MG tablet Take 1 tablet (40 mg total) by mouth 2 (two) times daily.  . potassium chloride SA (K-DUR,KLOR-CON) 20 MEQ tablet Take 2 tablets (40 mEq total) by mouth daily as needed (when taking lasix).  . prednisoLONE acetate (PRED FORTE) 1 % ophthalmic suspension Place 1 drop into both eyes 4 (four) times daily.   . psyllium (METAMUCIL) 58.6 % packet Take 1 packet by mouth daily as needed (for constipation).   . sodium chloride (MURO 128) 2 % ophthalmic solution Place 1 drop into both eyes 3 (three) times daily.   . traMADol-acetaminophen (ULTRACET) 37.5-325 MG tablet Take 1 tablet by mouth every 6 (six) hours as needed for moderate pain or severe pain.     Allergies:  Codeine; Tape; Asa [aspirin]; Iron; and Penicillins   Social History   Social History  . Marital status: Widowed    Spouse name: N/A  . Number of children: 5  . Years of education: 6   Occupational History  .  Retired   Social History Main Topics  . Smoking status: Former Smoker    Packs/day: 1.50    Years: 20.00    Types: Cigarettes    Quit date: 07/05/1975  . Smokeless tobacco: Never Used     Comment: Quit smoking in 1975  . Alcohol use No  . Drug use: No  . Sexual activity: No   Other Topics Concern  . None   Social History Narrative  . None     Family History:  The patient's   family history includes Asthma in her mother; Breast cancer in her daughter and daughter; Hyperlipidemia in her child; Multiple sclerosis in her child.   ROS:   Please see the history of present illness.    Review of Systems  Constitution: Positive for weakness and malaise/fatigue.  HENT: Positive for hearing loss.   Eyes: Negative.   Cardiovascular: Positive for dyspnea on exertion and leg swelling.  Respiratory: Negative.   Hematologic/Lymphatic: Negative.   Musculoskeletal: Positive for arthritis. Negative for joint pain.  Gastrointestinal: Positive for hemorrhoids.  Genitourinary: Negative.    All other systems  reviewed and are negative.   PHYSICAL EXAM:   VS:  BP (!) 102/50   Pulse 68   Ht _0  (1.651 m)   Wt 169 lb (76.7 kg)   SpO2 93%   BMI 28.12 kg/m   Physical Exam  GEN: Well nourished, well developed, in no acute distress  Neck: no JVD, carotid bruits, or masses Cardiac:RRR; 2/6 systolic murmur at the left sternal border and 2 to 3/6 diastolic murmur at the left sternal border.  Respiratory:  clear to auscultation bilaterally, normal work of breathing GI: soft, nontender, nondistended, + BS Ext: +2 puffy edema bilateral ankles without cyanosis, clubbing, decreased distal pulses bilaterally Neuro:  Alert and Oriented x 3 Psych: euthymic mood, full affect  Wt Readings from Last 3 Encounters:  04/01/17 169 lb (76.7 kg)  03/19/17 164 lb 7.4 oz (74.6 kg)  03/16/17 172 lb (78 kg)      Studies/Labs Reviewed:   EKG:  EKG is not ordered today.    Recent Labs: 05/20/2016: TSH 0.464 03/16/2017: ALT 10 03/18/2017: Magnesium 1.9 03/19/2017: BUN 19; Creatinine, Ser 1.16; Hemoglobin 12.7; Platelets 147; Potassium 4.2; Sodium 143   Lipid Panel    Component Value Date/Time   CHOL 189 03/17/2017 0150   TRIG 73 03/17/2017 0150   HDL 55 03/17/2017 0150   CHOLHDL 3.4 03/17/2017 0150   VLDL 15 03/17/2017 0150   LDLCALC 119 (H) 03/17/2017 0150    Additional studies/ records that were reviewed today include:    Ct Angio Chest/abd/pel For Dissection W And/or Wo Contrast   Result Date: 03/16/2017 CLINICAL DATA:  Chest pain radiating into back with some associated shortness of breath. EXAM: CT ANGIOGRAPHY CHEST, ABDOMEN AND PELVIS TECHNIQUE: Multidetector CT imaging through the chest, abdomen and pelvis was performed using the standard protocol during bolus administration of intravenous contrast. Multiplanar reconstructed images and MIPs were obtained and reviewed to evaluate the vascular anatomy. CONTRAST:  80 mL Isovue 370 IV COMPARISON:  CT of the abdomen and pelvis on 11/06/2015 FINDINGS:  CTA CHEST FINDINGS Cardiovascular: There is stable dilatation of the ascending thoracic aorta which measures  4.6 cm in greatest diameter. The aorta measures approximately 4.3 cm at the level of the sinuses of Valsalva. The proximal arch measures 3.6 cm. The distal arch measures 3.0 cm. The descending thoracic aorta measures 2.6 cm. No evidence of aortic dissection or intramural hemorrhage. No evidence of penetrating ulcer disease. The proximal great vessels show tortuosity without significant stenosis or aneurysmal disease. The heart is significantly enlarged. Calcified plaque is noted in the coronary tree in a 3 vessel distribution. There are some mild calcifications at the level of the aortic valve and mitral annulus. No pericardial fluid identified. Central pulmonary arteries are very prominent in caliber with the main pulmonary artery measuring approximately 4.3 cm in diameter. Findings are consistent with underlying pulmonary hypertension. Mediastinum/Nodes: No evidence of mediastinal, hilar or axillary lymphadenopathy. No mediastinal masses identified. No gross abnormalities involving the trachea or esophagus. Lungs/Pleura: Lungs demonstrate bilateral pulmonary scarring. No evidence of focal airspace consolidation, pulmonary edema or pleural effusions. No pulmonary masses or pneumothorax. Musculoskeletal: Bony structures are unremarkable. Review of the MIP images confirms the above findings. CTA ABDOMEN AND PELVIS FINDINGS VASCULAR Aorta: The abdominal aorta shows scattered plaque without evidence of aneurysm or dissection. Celiac: Origin stenosis of approximately 50%. SMA: Normally patent. Renals: Mild plaque at the origins of bilateral single renal arteries without evidence of stenosis. IMA: Normally patent. Inflow: Bilateral iliac arteries show moderate tortuosity and mild plaque without evidence of stenosis or aneurysmal disease. The common femoral arteries and femoral bifurcations are normally patent  bilaterally. Review of the MIP images confirms the above findings. NON-VASCULAR Hepatobiliary: No focal liver abnormality is seen. Status post cholecystectomy. No biliary dilatation. Pancreas: Unremarkable. No pancreatic ductal dilatation or surrounding inflammatory changes. Spleen: Normal in size without focal abnormality. Adrenals/Urinary Tract: Small lateral interpolar cortical mass visualized on the arterial phase and measures approximately 1.2 cm. This shows no significant growth since the prior study in 2017. Stable cyst of the contralateral left kidney. Adrenal glands appear unremarkable. The bladder is decompressed. Stomach/Bowel: Bowel shows no evidence of obstruction or inflammation. Diverticulosis of the sigmoid colon noted without evidence of acute diverticulitis. Lymphatic: No enlarged lymph nodes identified. Reproductive: Status post hysterectomy. No adnexal masses. Other: No abdominal wall hernia or abnormality. No abdominopelvic ascites. Musculoskeletal: Bones are osteopenic. No fractures or lesions identified. Review of the MIP images confirms the above findings. IMPRESSION: 1. Stable aneurysmal disease of the ascending thoracic aorta which measures 4.6 cm in greatest diameter. There is no evidence of acute aortic dissection or other acute aortic syndrome. 2. Coronary atherosclerosis with calcified plaque in a 3 vessel distribution. 3. Significant cardiac enlargement without pulmonary edema. 4. Evidence of pulmonary hypertension with significant dilatation of the main pulmonary artery and central pulmonary arteries. 5. Stable roughly 1.2 cm right renal mass centered in the lateral interpolar cortex. This shows no significant growth since a prior CT on 11/06/2015. Electronically Signed   By: Aletta Edouard M.D.   On: 03/16/2017 14:25      Echocardiogram 10/05/14: Study Conclusions - Left ventricle: The cavity size was normal. Wall thickness was   normal. Systolic function was normal. The  estimated ejection   fraction was in the range of 55% to 60%. Speckle tracking   assessment does not look to be accurate and is not reported.   Cannot exclude hypokinesis of the basalinferior myocardium.   Doppler parameters are consistent with borderline restrictive   physiology, indicative of decreased left ventricular diastolic   compliance and/or increased left atrial pressure. -  Aortic valve: Mildly calcified annulus. Trileaflet; mildly   thickened, mildly calcified leaflets. There was overall mild   (possibly moderate in some views) regurgitation directed   eccentrically in the LVOT. Difficult to get reliable vena   contracta, therefore not reported. - Ascending aorta: The ascending aorta was mildly dilated. Largest   measurement was 40 mm. - Mitral valve: Moderately calcified annulus. There was mild   regurgitation. - Left atrium: The atrium was severely dilated. - Right atrium: The atrium was mildly dilated. Central venous   pressure (est): 3 mm Hg. - Tricuspid valve: There was trivial regurgitation. - Pulmonary arteries: PA peak pressure: 32 mm Hg (S) based on   incomplete tricuspid regurgitation Doppler envelope. - Pericardium, extracardiac: There was no pericardial effusion.  Impressions: - Normal LV wall thickness and chamber size with LVEF 55-60%,   cannot exclude basal inferior hypokinesis. There is borderline   restrictive physiology with increased filling pressures. Severe   left atrial enlargement. MAC with mild mitral regurgitation.   Sclerotic aortic valve with overall mild aortic regurgitation   based on most views, see above discussion. Ascending aortic root   is mildly dilated, measures 40 mm based on limited imaging.   Left heart catheterization 03/18/17: 1.mild nonobstructive coronary artery disease. 2. Normal left ventricular end-diastolic pressure. Left ventricular angiography was not performed due to mildly elevated creatinine.   Recommendations: I  reviewed the echocardiogram images and I suspect that the patient has stress-induced cardiomyopathy. Continue treatment with carvedilol and consider adding a small dose ACE inhibitor/ARB if blood pressure tolerates and renal function remained stable. Consider switching Plavix to a NOAC given underlying atrial fibrillation.     Echocardiogram 03/17/17: Study Conclusions - Left ventricle: The cavity size was normal. Systolic function was   moderately reduced. The estimated ejection fraction was in the   range of 35% to 40%. Dyskinesis of the mid-apicalanteroseptal and   anterior myocardium; consistent with infarction in the   distribution of the left anterior descending coronary artery. No   evidence of thrombus. - Aortic valve: There was mild to moderate regurgitation directed   towards the mitral anterior leaflet. - Ascending aorta: The ascending aorta was mildly dilated. - Mitral valve: Calcified annulus. There was mild to moderate   regurgitation directed centrally. - Left atrium: The atrium was moderately to severely dilated. - Right atrium: The atrium was mildly dilated. - Tricuspid valve: There was moderate regurgitation. - Pulmonary arteries: Systolic pressure was mildly increased. PA   peak pressure: 42 mm Hg (S).     ASSESSMENT:    1. Stress-induced cardiomyopathy   2. Non-ST elevation (NSTEMI) myocardial infarction ([place])   3. Ascending aortic aneurysm ([place])   4. Atrial fibrillation, chronic ([place])   5. Iron deficiency anemia, unspecified iron deficiency anemia type      PLAN:  In order of problems listed above:  Stress-induced cardiomyopathy ejection fraction 35-40% placed on carvedilol. No ACE inhibitor ordered because of low blood pressure and renal insufficiency.Blood pressure is still quite low so can't add ACE inhibitor today. Legs have begun to swell I suspect secondary to extra sodium with all the food she is having brought in. Have discussed a 2 g sodium diet.  Told her she could take an extra Lasix and potassium today and tomorrow. Check renal function and CBC today. Follow-up with Dr. Bronson Ing in 1 month.  NSTEMI with nonobstructive CAD at cath-no recurrent chest pain.  Ascending aortic aneurysm 4.6 cm on CT 03/16/17  Chronic atrial fibrillation  not on anticoagulation because of prior GI bleed and on Plavix for TIA. ASA allergy and not a candidate for DAPT.  Anemia with bright red blood from hemorrhoids. We'll check a CBC today and have her follow-up with Dr. Buford Dresser    Medication Adjustments/Labs and Tests Ordered: Current medicines are reviewed at length with the patient today.  Concerns regarding medicines are outlined above.  Medication changes, Labs and Tests ordered today are listed in the Patient Instructions below. Patient Instructions  Medication Instructions:  Your physician recommends that you continue on your current medications as directed. Please refer to the Current Medication list given to you today.   Labwork: Your physician recommends that you return for lab work in: Today    Testing/Procedures: NONE  Follow-Up: Your physician recommends that you schedule a follow-up appointment in: 1 Month   Any Other Special Instructions Will Be Listed Below (If Applicable). You may take an extra Lasix and Potassium today      If you need a refill on your cardiac medications before your next appointment, please call your pharmacy.  Thank you for choosing [facility]!      Signed, [name] Barrios, PA-C  04/01/2017 1:57 PM    [facility] [place], [place], [place]  [address] Phone: [phone]; Fax: [phone]

## 2017-04-02 ENCOUNTER — Emergency Department (HOSPITAL_COMMUNITY): Payer: Medicare Other

## 2017-04-02 ENCOUNTER — Inpatient Hospital Stay (HOSPITAL_COMMUNITY)
Admission: EM | Admit: 2017-04-02 | Discharge: 2017-04-05 | DRG: 291 | Disposition: A | Discharge status: Home Health | Payer: Medicare Other | Attending: Internal Medicine | Admitting: Internal Medicine

## 2017-04-02 ENCOUNTER — Telehealth: Payer: Self-pay | Admitting: *Deleted

## 2017-04-02 ENCOUNTER — Encounter (HOSPITAL_COMMUNITY): Payer: Self-pay | Admitting: Emergency Medicine

## 2017-04-02 DIAGNOSIS — I130 Hypertensive heart and chronic kidney disease with heart failure and stage 1 through stage 4 chronic kidney disease, or unspecified chronic kidney disease: Principal | ICD-10-CM | POA: Diagnosis present

## 2017-04-02 DIAGNOSIS — Z853 Personal history of malignant neoplasm of breast: Secondary | ICD-10-CM

## 2017-04-02 DIAGNOSIS — I5021 Acute systolic (congestive) heart failure: Secondary | ICD-10-CM | POA: Diagnosis not present

## 2017-04-02 DIAGNOSIS — Z888 Allergy status to other drugs, medicaments and biological substances status: Secondary | ICD-10-CM

## 2017-04-02 DIAGNOSIS — I4510 Unspecified right bundle-branch block: Secondary | ICD-10-CM | POA: Diagnosis present

## 2017-04-02 DIAGNOSIS — R0602 Shortness of breath: Secondary | ICD-10-CM | POA: Diagnosis not present

## 2017-04-02 DIAGNOSIS — I7121 Aneurysm of the ascending aorta, without rupture: Secondary | ICD-10-CM | POA: Diagnosis present

## 2017-04-02 DIAGNOSIS — Z87891 Personal history of nicotine dependence: Secondary | ICD-10-CM

## 2017-04-02 DIAGNOSIS — Z7902 Long term (current) use of antithrombotics/antiplatelets: Secondary | ICD-10-CM

## 2017-04-02 DIAGNOSIS — I482 Chronic atrial fibrillation, unspecified: Secondary | ICD-10-CM | POA: Diagnosis present

## 2017-04-02 DIAGNOSIS — Z96642 Presence of left artificial hip joint: Secondary | ICD-10-CM | POA: Diagnosis present

## 2017-04-02 DIAGNOSIS — Z8711 Personal history of peptic ulcer disease: Secondary | ICD-10-CM

## 2017-04-02 DIAGNOSIS — Z91048 Other nonmedicinal substance allergy status: Secondary | ICD-10-CM

## 2017-04-02 DIAGNOSIS — D509 Iron deficiency anemia, unspecified: Secondary | ICD-10-CM | POA: Diagnosis present

## 2017-04-02 DIAGNOSIS — I700 Atherosclerosis of aorta: Secondary | ICD-10-CM | POA: Diagnosis not present

## 2017-04-02 DIAGNOSIS — Z886 Allergy status to analgesic agent status: Secondary | ICD-10-CM

## 2017-04-02 DIAGNOSIS — I5023 Acute on chronic systolic (congestive) heart failure: Secondary | ICD-10-CM | POA: Diagnosis present

## 2017-04-02 DIAGNOSIS — I712 Thoracic aortic aneurysm, without rupture: Secondary | ICD-10-CM | POA: Diagnosis present

## 2017-04-02 DIAGNOSIS — Z8673 Personal history of transient ischemic attack (TIA), and cerebral infarction without residual deficits: Secondary | ICD-10-CM

## 2017-04-02 DIAGNOSIS — E876 Hypokalemia: Secondary | ICD-10-CM

## 2017-04-02 DIAGNOSIS — Z880 Allergy status to penicillin: Secondary | ICD-10-CM

## 2017-04-02 DIAGNOSIS — I110 Hypertensive heart disease with heart failure: Secondary | ICD-10-CM | POA: Diagnosis not present

## 2017-04-02 DIAGNOSIS — R079 Chest pain, unspecified: Secondary | ICD-10-CM | POA: Diagnosis not present

## 2017-04-02 DIAGNOSIS — I10 Essential (primary) hypertension: Secondary | ICD-10-CM | POA: Diagnosis present

## 2017-04-02 DIAGNOSIS — Z66 Do not resuscitate: Secondary | ICD-10-CM | POA: Diagnosis present

## 2017-04-02 DIAGNOSIS — I509 Heart failure, unspecified: Secondary | ICD-10-CM

## 2017-04-02 DIAGNOSIS — M79606 Pain in leg, unspecified: Secondary | ICD-10-CM

## 2017-04-02 DIAGNOSIS — J9601 Acute respiratory failure with hypoxia: Secondary | ICD-10-CM | POA: Diagnosis present

## 2017-04-02 DIAGNOSIS — I2510 Atherosclerotic heart disease of native coronary artery without angina pectoris: Secondary | ICD-10-CM | POA: Diagnosis present

## 2017-04-02 DIAGNOSIS — Z885 Allergy status to narcotic agent status: Secondary | ICD-10-CM

## 2017-04-02 DIAGNOSIS — I82811 Embolism and thrombosis of superficial veins of right lower extremities: Secondary | ICD-10-CM | POA: Diagnosis present

## 2017-04-02 DIAGNOSIS — G8929 Other chronic pain: Secondary | ICD-10-CM | POA: Diagnosis present

## 2017-04-02 DIAGNOSIS — Z85820 Personal history of malignant melanoma of skin: Secondary | ICD-10-CM

## 2017-04-02 DIAGNOSIS — N183 Chronic kidney disease, stage 3 (moderate): Secondary | ICD-10-CM | POA: Diagnosis present

## 2017-04-02 DIAGNOSIS — Z79899 Other long term (current) drug therapy: Secondary | ICD-10-CM

## 2017-04-02 DIAGNOSIS — N289 Disorder of kidney and ureter, unspecified: Secondary | ICD-10-CM

## 2017-04-02 HISTORY — DX: Atherosclerotic heart disease of native coronary artery without angina pectoris: I25.10

## 2017-04-02 LAB — BASIC METABOLIC PANEL
Anion gap: 6 (ref 5–15)
BUN: 20 mg/dL (ref 6–20)
CALCIUM: 8.2 mg/dL — AB (ref 8.9–10.3)
CO2: 29 mmol/L (ref 22–32)
CREATININE: 1.39 mg/dL — AB (ref 0.44–1.00)
Chloride: 104 mmol/L (ref 101–111)
GFR calc Af Amer: 37 mL/min — ABNORMAL LOW (ref >60)
GFR calc non Af Amer: 32 mL/min — ABNORMAL LOW (ref >60)
Glucose, Bld: 101 mg/dL — ABNORMAL HIGH (ref 65–99)
Potassium: 4.3 mmol/L (ref 3.5–5.1)
SODIUM: 139 mmol/L (ref 135–145)

## 2017-04-02 LAB — URINALYSIS, ROUTINE W REFLEX MICROSCOPIC
BACTERIA UA: NONE SEEN
Bilirubin Urine: NEGATIVE
Glucose, UA: NEGATIVE mg/dL
Ketones, ur: NEGATIVE mg/dL
Leukocytes, UA: NEGATIVE
Nitrite: NEGATIVE
PROTEIN: NEGATIVE mg/dL
Specific Gravity, Urine: 1.009 (ref 1.005–1.030)
pH: 5 (ref 5.0–8.0)

## 2017-04-02 LAB — HEPATIC FUNCTION PANEL
ALBUMIN: 3.7 g/dL (ref 3.5–5.0)
ALK PHOS: 62 U/L (ref 38–126)
ALT: 8 U/L — AB (ref 14–54)
AST: 17 U/L (ref 15–41)
BILIRUBIN INDIRECT: 0.5 mg/dL (ref 0.3–0.9)
BILIRUBIN TOTAL: 0.7 mg/dL (ref 0.3–1.2)
Bilirubin, Direct: 0.2 mg/dL (ref 0.1–0.5)
Total Protein: 6.5 g/dL (ref 6.5–8.1)

## 2017-04-02 LAB — DIFFERENTIAL
BASOS ABS: 0 10*3/uL (ref 0.0–0.1)
BASOS PCT: 1 %
Eosinophils Absolute: 0.1 10*3/uL (ref 0.0–0.7)
Eosinophils Relative: 3 %
LYMPHS ABS: 1.4 10*3/uL (ref 0.7–4.0)
Lymphocytes Relative: 28 %
MONO ABS: 0.5 10*3/uL (ref 0.1–1.0)
MONOS PCT: 10 %
NEUTROS ABS: 3 10*3/uL (ref 1.7–7.7)
Neutrophils Relative %: 58 %

## 2017-04-02 LAB — CBC
HEMATOCRIT: 35.3 % — AB (ref 36.0–46.0)
HEMOGLOBIN: 11.3 g/dL — AB (ref 12.0–15.0)
MCH: 31 pg (ref 26.0–34.0)
MCHC: 32 g/dL (ref 30.0–36.0)
MCV: 97 fL (ref 78.0–100.0)
Platelets: 167 10*3/uL (ref 150–400)
RBC: 3.64 MIL/uL — ABNORMAL LOW (ref 3.87–5.11)
RDW: 15.3 % (ref 11.5–15.5)
WBC: 5.1 10*3/uL (ref 4.0–10.5)

## 2017-04-02 LAB — D-DIMER, QUANTITATIVE (NOT AT ARMC)

## 2017-04-02 LAB — BRAIN NATRIURETIC PEPTIDE: B Natriuretic Peptide: 1521 pg/mL — ABNORMAL HIGH (ref 0.0–100.0)

## 2017-04-02 LAB — TROPONIN I: Troponin I: <0.03 ng/mL (ref <0.03)

## 2017-04-02 MED ORDER — PREDNISOLONE ACETATE 1 % OP SUSP
1.0000 [drp] | Freq: Four times a day (QID) | OPHTHALMIC | Status: DC
  Administered 2017-04-03 – 2017-04-05 (×9): 1 [drp] via OPHTHALMIC
  Filled 2017-04-02: qty 1
  Filled 2017-04-02: qty 5

## 2017-04-02 MED ORDER — ALPRAZOLAM 0.25 MG PO TABS
0.2500 mg | ORAL_TABLET | Freq: Two times a day (BID) | ORAL | Status: DC | PRN
  Administered 2017-04-03: 0.25 mg via ORAL
  Filled 2017-04-02: qty 1

## 2017-04-02 MED ORDER — GABAPENTIN 100 MG PO CAPS
200.0000 mg | ORAL_CAPSULE | Freq: Three times a day (TID) | ORAL | Status: DC
  Administered 2017-04-02 – 2017-04-05 (×8): 200 mg via ORAL
  Filled 2017-04-02 (×8): qty 2

## 2017-04-02 MED ORDER — TRAMADOL-ACETAMINOPHEN 37.5-325 MG PO TABS
1.0000 | ORAL_TABLET | Freq: Four times a day (QID) | ORAL | Status: DC | PRN
  Administered 2017-04-02 – 2017-04-04 (×4): 1 via ORAL
  Filled 2017-04-02 (×4): qty 1

## 2017-04-02 MED ORDER — ONDANSETRON HCL 4 MG/2ML IJ SOLN
4.0000 mg | Freq: Four times a day (QID) | INTRAMUSCULAR | Status: DC | PRN
Start: 2017-04-02 — End: 2017-04-05
  Administered 2017-04-03 – 2017-04-05 (×3): 4 mg via INTRAVENOUS
  Filled 2017-04-02 (×3): qty 2

## 2017-04-02 MED ORDER — PSYLLIUM 95 % PO PACK
1.0000 | PACK | Freq: Every day | ORAL | Status: DC | PRN
  Filled 2017-04-02: qty 1

## 2017-04-02 MED ORDER — ACETAMINOPHEN 325 MG PO TABS
650.0000 mg | ORAL_TABLET | Freq: Four times a day (QID) | ORAL | Status: DC | PRN
  Administered 2017-04-03: 650 mg via ORAL
  Filled 2017-04-02: qty 2

## 2017-04-02 MED ORDER — CARVEDILOL 3.125 MG PO TABS
3.1250 mg | ORAL_TABLET | Freq: Two times a day (BID) | ORAL | Status: DC
  Administered 2017-04-03 – 2017-04-05 (×5): 3.125 mg via ORAL
  Filled 2017-04-02 (×5): qty 1

## 2017-04-02 MED ORDER — POTASSIUM CHLORIDE CRYS ER 20 MEQ PO TBCR
40.0000 meq | EXTENDED_RELEASE_TABLET | Freq: Every day | ORAL | Status: DC
  Administered 2017-04-03 – 2017-04-05 (×3): 40 meq via ORAL
  Filled 2017-04-02 (×3): qty 2

## 2017-04-02 MED ORDER — CLOPIDOGREL BISULFATE 75 MG PO TABS
75.0000 mg | ORAL_TABLET | Freq: Every day | ORAL | Status: DC
  Administered 2017-04-03 – 2017-04-05 (×3): 75 mg via ORAL
  Filled 2017-04-02 (×3): qty 1

## 2017-04-02 MED ORDER — PANTOPRAZOLE SODIUM 40 MG PO TBEC
40.0000 mg | DELAYED_RELEASE_TABLET | Freq: Two times a day (BID) | ORAL | Status: DC
  Administered 2017-04-03 – 2017-04-05 (×6): 40 mg via ORAL
  Filled 2017-04-02 (×6): qty 1

## 2017-04-02 MED ORDER — FOLIC ACID 1 MG PO TABS
1.0000 mg | ORAL_TABLET | Freq: Every day | ORAL | Status: DC
  Administered 2017-04-03 – 2017-04-05 (×3): 1 mg via ORAL
  Filled 2017-04-02 (×3): qty 1

## 2017-04-02 MED ORDER — HEPARIN SODIUM (PORCINE) 5000 UNIT/ML IJ SOLN: 5000.0000 [IU] | Freq: Three times a day (TID) | INTRAMUSCULAR | Status: DC

## 2017-04-02 MED ORDER — IOPAMIDOL (ISOVUE-370) INJECTION 76%
100.0000 mL | Freq: Once | INTRAVENOUS | Status: AC | PRN
  Administered 2017-04-02: 65 mL via INTRAVENOUS

## 2017-04-02 MED ORDER — FUROSEMIDE 10 MG/ML IJ SOLN
20.0000 mg | Freq: Three times a day (TID) | INTRAMUSCULAR | Status: DC
  Administered 2017-04-03 – 2017-04-05 (×8): 20 mg via INTRAVENOUS
  Filled 2017-04-02 (×8): qty 2

## 2017-04-02 MED ORDER — SODIUM CHLORIDE (HYPERTONIC) 2 % OP SOLN
1.0000 [drp] | Freq: Three times a day (TID) | OPHTHALMIC | Status: DC
  Administered 2017-04-03 – 2017-04-04 (×2): 1 [drp] via OPHTHALMIC
  Filled 2017-04-02: qty 15

## 2017-04-02 MED ORDER — HEPARIN SODIUM (PORCINE) 5000 UNIT/ML IJ SOLN
5000.0000 [IU] | Freq: Three times a day (TID) | INTRAMUSCULAR | Status: DC
  Administered 2017-04-03 – 2017-04-05 (×8): 5000 [IU] via SUBCUTANEOUS
  Filled 2017-04-02 (×8): qty 1

## 2017-04-02 MED ORDER — FUROSEMIDE 10 MG/ML IJ SOLN
20.0000 mg | Freq: Once | INTRAMUSCULAR | Status: AC
  Administered 2017-04-02: 20 mg via INTRAVENOUS
  Filled 2017-04-02: qty 2

## 2017-04-02 NOTE — ED Notes (Signed)
Patient states that she was having some shortness of breath and chest pain upon returning to bed after using bed side commode. Dr Olevia Bowens in room. EKG done and given to him.

## 2017-04-02 NOTE — Telephone Encounter (Signed)
-----   Message from Imogene Burn, PA-C sent at 04/01/2017  2:40 PM EDT ----- Hemoglobin is down from the hospital. Please call Dr. Gala Romney for f/u of bleeding hemorrhoids and schedule yourself for Iron shot. Still waiting on bmet

## 2017-04-02 NOTE — ED Triage Notes (Signed)
Pt reports shortness of breath starting about 3pm with no chest pain.

## 2017-04-02 NOTE — ED Notes (Signed)
Patient's daughter asking if patient can gave something to eat. Checked with Hospitalist Dr Olevia Bowens. States that will be ok as long as she is not vomiting or nauseated. Patient given meal tray.

## 2017-04-02 NOTE — ED Provider Notes (Signed)
[facility] DEPT Provider Note   CSN: [id number] Arrival date & time: 04/02/17  1654     History   Chief Complaint Chief Complaint  Patient presents with  . Shortness of Breath    HPI [name] [name] is a 83 y.o. female.  Patient complains of shortness of breath. Has become worse last couple days   The history is provided by the patient.  Shortness of Breath  This is a recurrent problem. The problem occurs continuously.The current episode started yesterday. The problem has not changed since onset.Pertinent negatives include no fever, no headaches, no cough, no chest pain, no abdominal pain and no rash. It is unknown (unknown) what precipitated the problem. Risk factors: cardiomyopathy.    Past Medical History:  Diagnosis Date  . Aneurysm of aorta (HCC)   . Aneurysm, thoracic aortic ([place])   . Aortic insufficiency   . Ascending aortic aneurysm ([place])    CT in 2012 - 4.2x4.2cm  . Atrial fibrillation ([place])   . Bladder infection    h/o  . 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 Dehydration    HISTORY   . Fall at home 09/10/12  . Hemorrhoids   . Hip fracture ([place])    hip surgery 2001  . History of blood clots    in leg  . History of knee surgery   . Hypertension   . IDA (iron deficiency anemia) 06/07/2013  . Iron deficiency anemia, unspecified 03/14/2013   secondary to GI blood loss  . Kidney infection    h/o  . Melanoma of skin ([place]) 03/14/2013  . Melanoma of thigh ([place])    left  . Mini stroke ([place])   . S/P colonoscopy March 2010   RMR: friable anal canal hemorrhoids, hyperplastic ascending polyp, adenomatous descending polyp   . Stomach ulcer    secondary to h.pylori, s/p treatment  . Thyroid condition   . Venous stasis    edema    Patient Active Problem List   Diagnosis Date Noted  . Acute on chronic systolic heart failure ([place]) 04/02/2017  . Stress-induced cardiomyopathy 03/19/2017  . Non-ST elevation (NSTEMI) myocardial  infarction ([place])   . Chest pain 03/16/2017  . Atrial fibrillation, chronic ([place]) 03/16/2017  . Elevated homocysteine ([place]) 01/26/2017  . Vitamin B12 deficiency 01/26/2017  . Dysphagia 05/20/2016  . Thrombocytopenia ([place]) 05/19/2016  . Acute respiratory failure with hypoxia ([place]) 05/18/2016  . Acute bronchitis 05/18/2016  . Bilateral lower extremity edema 05/18/2016  . Degenerative joint disease involving multiple joints 05/18/2016  . Pyuria 05/18/2016  . DNR (do not resuscitate) 05/18/2016  . Constipation 02/09/2015  . Occult GI bleeding 08/23/2013  . IDA (iron deficiency anemia) 06/07/2013  . Melanoma of skin ([place]) 03/14/2013  . DCIS (ductal carcinoma in situ) of breast 03/14/2013  . BPPV (benign paroxysmal positional vertigo) 02/10/2013  . RBBB 02/10/2013  . Ascending aortic aneurysm ([place]) 02/10/2013  . Aortic insufficiency 02/10/2013  . S/P total knee replacement 08/14/2011  . Helicobacter pylori gastritis 07/14/2011  . Anemia 02/04/2011  . Bladder infection   . BURSITIS, HIP 05/29/2009    Past Surgical History:  Procedure Laterality Date  . APPENDECTOMY  1942  . BREAST LUMPECTOMY  1998  . CATARACT EXTRACTION, BILATERAL    . CHOLECYSTECTOMY    . COLONOSCOPY  06/05/11   pancolonic diverticulosis/ileal reosion/abnormal anorectal junction s/p biopsy: path for small intestine and Ti was benign with non-villous atrophy, rectal biopsy with prominent prolapse changes,  no acute inflammation  . CORNEAL TRANSPLANT Bilateral 2013  . ENTEROSCOPY N/A 06/13/2013   OMB:TDHR Gastritis/GI BLEED MOST LIKELY DUE TO DUODENAL ULCERS, AND ? AVMs  . ESOPHAGOGASTRODUODENOSCOPY  06/05/11   small hiatal hernia; + H.PYLORI GASTRITIS, s/p 5 days of Pylera, unable to finish due to N/V  . ESOPHAGOGASTRODUODENOSCOPY N/A 06/13/2013   Dr. Oneida Alar- see enteroscopy  . ESOPHAGOGASTRODUODENOSCOPY (EGD) WITH ESOPHAGEAL DILATION N/A 06/08/2013   CBU:LAGTX hiatal hernia;  otherwise normal EGD s/p dilation  .  GIVENS CAPSULE STUDY N/A 06/08/2013   Procedure: GIVENS CAPSULE STUDY;  Surgeon: Daneil Dolin, MD;  Location: [facility];  Service: Endoscopy;  Laterality: N/A;  . HEMORRHOID BANDING    . KNEE SURGERY Right 05/2006   total right  . LEFT HEART CATH AND CORONARY ANGIOGRAPHY N/A 03/18/2017   Procedure: LEFT HEART CATH AND CORONARY ANGIOGRAPHY;  Surgeon: Wellington Hampshire, MD;  Location: [facility];  Service: Cardiovascular;  Laterality: N/A;  . LEG SURGERY    . MELANOMA EXCISION Left 08/2006   left leg excision  . NM MYOCAR PERF WALL MOTION  03/27/2010   dipyridamole; small reversible basal to mid-septal defect (?artifact), post-stress EF 55%, low risk scan   . PARTIAL HYSTERECTOMY  1976  . TONSILLECTOMY    . TOTAL HIP ARTHROPLASTY Left 2001&2004    X 2 FOR LEFT HIP  . TRANSTHORACIC ECHOCARDIOGRAM  10/06/2012   EF 55-60%, mod eccentric hypertrophy, grade 2 diastolic dysfunction; mildly calcifed AV annulus with moderate regurg; aortic root mildly dilated; LA severely dailted; PA peak pressure 62mHg  . VARICOSE VEIN SURGERY      OB History    No data available       Home Medications    Prior to Admission medications   Medication Sig Start Date End Date Taking? Authorizing Provider  albuterol (PROAIR HFA) 108 (90 Base) MCG/ACT inhaler Inhale 1-2 puffs into the lungs every 6 (six) hours as needed for wheezing or shortness of breath.   Yes [provider]  carvedilol (COREG) 3.125 MG tablet Take 1 tablet (3.125 mg total) by mouth 2 (two) times daily with a meal. 03/19/17  Yes Vann, Jessica U, DO  clopidogrel (PLAVIX) 75 MG tablet Take 75 mg by mouth daily with breakfast.   Yes [provider]  cyanocobalamin ([employer] VITAMIN B12) 2000 MCG tablet Take 1 tablet (2,000 mcg total) by mouth daily. 01/26/17  Yes Baird Cancer, PA-C  erythromycin ophthalmic ointment 1 application as needed (for eye infection).  03/10/17  Yes [provider]  folic acid (FOLVITE) 1  MG tablet Take 1 tablet (1 mg total) by mouth daily. 01/26/17  Yes Kefalas, Manon Hilding, PA-C  furosemide (LASIX) 20 MG tablet Take 1 tablet (20 mg total) by mouth daily as needed for edema. Patient taking differently: Take 20 mg by mouth 2 (two) times daily.  03/19/17  Yes Vann, Jessica U, DO  gabapentin (NEURONTIN) 100 MG capsule TAKE 2 CAPSULES BY MOUTH 3  TIMES DAILY Patient taking differently: Take 200 mg by mouth three times a day 08/05/16   Carole Civil, MD  hydrocortisone (ANUSOL-HC) 25 MG suppository Place 1 suppository (25 mg total) rectally every 12 (twelve) hours. Patient taking differently: Place 25 mg rectally 2 (two) times daily as needed for hemorrhoids or itching.  05/16/15   Annitta Needs, NP  ondansetron (ZOFRAN) 4 MG tablet Take 1 tablet (4 mg total) by mouth every 8 (eight) hours as needed for nausea or  vomiting. Patient taking differently: Take 4 mg by mouth daily before breakfast.  09/13/13   Mahala Menghini, PA-C  pantoprazole (PROTONIX) 40 MG tablet Take 1 tablet (40 mg total) by mouth 2 (two) times daily. 02/09/15   Annitta Needs, NP  potassium chloride SA (K-DUR,KLOR-CON) 20 MEQ tablet Take 2 tablets (40 mEq total) by mouth daily as needed (when taking lasix). 03/19/17   Geradine Girt, DO  prednisoLONE acetate (PRED FORTE) 1 % ophthalmic suspension Place 1 drop into both eyes 4 (four) times daily.     [provider]  psyllium (METAMUCIL) 58.6 % packet Take 1 packet by mouth daily as needed (for constipation).     [provider]  sodium chloride (MURO 128) 2 % ophthalmic solution Place 1 drop into both eyes 3 (three) times daily.     [provider]  traMADol-acetaminophen (ULTRACET) 37.5-325 MG tablet Take 1 tablet by mouth every 6 (six) hours as needed for moderate pain or severe pain. 03/19/17   Geradine Girt, DO    Family History Family History  Problem Relation Age of Onset  . Breast cancer Daughter        also hyperlipidemia  . Breast  cancer Daughter        also hyperlipidemia  . Asthma Mother   . Multiple sclerosis Child   . Hyperlipidemia Child   . Colon cancer Neg Hx     Social History Social History  Substance Use Topics  . Smoking status: Former Smoker    Packs/day: 1.50    Years: 20.00    Types: Cigarettes    Quit date: 07/05/1975  . Smokeless tobacco: Never Used     Comment: Quit smoking in 1975  . Alcohol use No     Allergies   Codeine; Tape; Asa [aspirin]; Iron; and Penicillins   Review of Systems Review of Systems  Constitutional: Negative for appetite change, fatigue and fever.  HENT: Negative for congestion, ear discharge and sinus pressure.   Eyes: Negative for discharge.  Respiratory: Positive for shortness of breath. Negative for cough.   Cardiovascular: Negative for chest pain.  Gastrointestinal: Negative for abdominal pain and diarrhea.  Genitourinary: Negative for frequency and hematuria.  Musculoskeletal: Negative for back pain.  Skin: Negative for rash.  Neurological: Negative for seizures and headaches.  Psychiatric/Behavioral: Negative for hallucinations.     Physical Exam Updated Vital Signs BP (!) 129/53 (BP Location: Left Arm)   Pulse (!) 51   Temp 97.7 F (36.5 C) (Oral)   Resp 18   Ht 5\' 5"  (1.651 m)   Wt 76.7 kg (169 lb)   SpO2 100%   BMI 28.12 kg/m   Physical Exam  Constitutional: She is oriented to person, place, and time. She appears well-developed.  HENT:  Head: Normocephalic.  Eyes: Conjunctivae and EOM are normal. No scleral icterus.  Neck: Neck supple. No thyromegaly present.  Cardiovascular: Normal rate and regular rhythm.  Exam reveals no gallop and no friction rub.   No murmur heard. Pulmonary/Chest: No stridor. She has no wheezes. She has no rales. She exhibits no tenderness.  Abdominal: She exhibits no distension. There is no tenderness. There is no rebound.  Musculoskeletal: Normal range of motion. She exhibits no edema.  Lymphadenopathy:     She has no cervical adenopathy.  Neurological: She is oriented to person, place, and time. She exhibits normal muscle tone. Coordination normal.  Skin: No rash noted. No erythema.  Psychiatric: She has a normal  mood and affect. Her behavior is normal.     ED Treatments / Results  Labs (all labs ordered are listed, but only abnormal results are displayed) Labs Reviewed  BASIC METABOLIC PANEL - Abnormal; Notable for the following:       Result Value   Glucose, Bld 101 (*)    Creatinine, Ser 1.39 (*)    Calcium 8.2 (*)    GFR calc non Af Amer 32 (*)    GFR calc Af Amer 37 (*)    All other components within normal limits  CBC - Abnormal; Notable for the following:    RBC 3.64 (*)    Hemoglobin 11.3 (*)    HCT 35.3 (*)    All other components within normal limits  HEPATIC FUNCTION PANEL - Abnormal; Notable for the following:    ALT 8 (*)    All other components within normal limits  D-DIMER, QUANTITATIVE (NOT AT [facility]) - Abnormal; Notable for the following:    D-Dimer, Quant >20.00 (*)    All other components within normal limits  BRAIN NATRIURETIC PEPTIDE - Abnormal; Notable for the following:    B Natriuretic Peptide 1,521.0 (*)    All other components within normal limits  TROPONIN I  DIFFERENTIAL    EKG  EKG Interpretation  Date/Time:  Thursday April 02 2017 17:03:57 EDT Ventricular Rate:  58 PR Interval:    QRS Duration: 147 QT Interval:  528 QTC Calculation: 519 R Axis:   -111 Text Interpretation:  Atrial fibrillation RBBB and LAFB Abnormal T, probable ischemia, widespread Confirmed by Milton Ferguson [id number]) on 04/02/2017 5:10:11 PM       Radiology Dg Chest 2 View  Result Date: 04/02/2017 CLINICAL DATA:  Shortness of breath.  Chest pain. EXAM: CHEST  2 VIEW COMPARISON:  March 16, 2017 FINDINGS: Stable cardiomegaly. The hila and mediastinum are unchanged. No nodules, masses, or focal infiltrates within the lungs. Probable tiny effusions or pleural thickening  seen on the lateral view. Anterior wedging of an upper thoracic vertebral body is stable. IMPRESSION: No active cardiopulmonary disease. Electronically Signed   By: Dorise Bullion III M.D   On: 04/02/2017 17:52   Ct Angio Chest Pe W And/or Wo Contrast  Result Date: 04/02/2017 CLINICAL DATA:  Chest pain or shortness of breath EXAM: CT ANGIOGRAPHY CHEST WITH CONTRAST TECHNIQUE: Multidetector CT imaging of the chest was performed using the standard protocol during bolus administration of intravenous contrast. Multiplanar CT image reconstructions and MIPs were obtained to evaluate the vascular anatomy. CONTRAST:  65 mL Isovue 370 intravenous COMPARISON:  04/02/2017 FINDINGS: Cardiovascular: Aneurysmal dilatation of the ascending aorta up to 4.6 cm. Atherosclerotic calcifications. Coronary artery calcifications. Cardiomegaly. No pericardial effusion. Satisfactory opacification of the pulmonary arterial system. Negative for acute filling defect to suggest pulmonary embolus. Enlarged pulmonary trunk up to 4.5 cm. Mediastinum/Nodes: Prominent heterogenous thyroid. Midline trachea. 1 cm precarinal lymph node. Esophagus within normal limits Lungs/Pleura: Linear atelectasis or scarring at the lingula and right apex. Negative for effusion or consolidation. Negative for pneumothorax. Upper Abdomen: Reflux of contrast into the hepatic veins. Partially visualized cyst midpole left kidney. Musculoskeletal: Osteopenia.  Mild to moderate compression of T7. Review of the MIP images confirms the above findings. IMPRESSION: 1. Negative for acute pulmonary embolus. 2. Enlarged pulmonary arterial trunk consistent with pulmonary hypertension. 3. Aneurysmal dilatation of the ascending aorta up to 4.6 cm. Ascending thoracic aortic aneurysm. Recommend semi-annual imaging followup by CTA or MRA and referral to cardiothoracic surgery if not already  obtained. This recommendation follows 2010 ACCF/AHA/AATS/ACR/ASA/SCA/SCAI/SIR/STS/SVM  Guidelines for the Diagnosis and Management of Patients With Thoracic Aortic Disease. Circulation. 2010; 121: J856-D149 4. Clear lung fields aside from areas of pulmonary scarring 5. Mild to moderate compression of T7, no change from recent CT Aortic aneurysm NOS (ICD10-I71.9). Aortic Atherosclerosis (ICD10-I70.0). Electronically Signed   By: Donavan Foil M.D.   On: 04/02/2017 19:11    Procedures Procedures (including critical care time)  Medications Ordered in ED Medications  furosemide (LASIX) injection 20 mg (not administered)  iopamidol (ISOVUE-370) 76 % injection 100 mL (65 mLs Intravenous Contrast Given 04/02/17 1841)     Initial Impression / Assessment and Plan / ED Course  I have reviewed the triage vital signs and the nursing notes.  Pertinent labs & imaging results that were available during my care of the patient were reviewed by me and considered in my medical decision making (see chart for details).    Patient with shortness of breath and worsening congestive heart failure. Patient will be admitted to internal medicine  Final Clinical Impressions(s) / ED Diagnoses   Final diagnoses:  Acute systolic congestive heart failure ([place])    New Prescriptions New Prescriptions   No medications on file     Milton Ferguson, MD 04/02/17 1945

## 2017-04-02 NOTE — H&P (Signed)
History and Physical    [name] [name] IWP:[id number] DOB: 1922/12/31 DOA: 04/02/2017  PCP: [name], MD   Patient coming from: Home.  I have personally briefly reviewed patient's old medical records in [place]  Chief Complaint: Shortness of breath.  HPI: [name] [name] is a 83 y.o. female with medical history significant of ascending thoracic aneurysm, aortic valve insufficiency, chronic atrial fibrillation, nonobstructive CAD, varicose veins, lymphedema, history of breast cancer, hemorrhoids, history of hip fracture, history of DVTs, hypertension, iron deficiency anemia, history of pyelonephritis, history of melanoma of left thigh, TIA, PUD, history of thyroid condition ( has not been on levothyroxine for a long time) who is coming to the emergency department with complaints of progressively worse shortness of breath since yesterday. Associated symptoms are orthopnea, occasionally nonproductive cough and lower extremity edema, for which she has been taking furosemide twice a day as needed along with potassium supplementation. While in the ER, the patient had transient right-sided chest pressure, nonradiating with no changes on repeat EKG. She denies diaphoresis, worsening of dyspnea, palpitations, nausea or emesis. No fever, chills, sore throat, productive cough, wheezing, hemoptysis, abdominal pain, diarrhea, melena or hematochezia. She has frequent constipation, but denies dysuria, frequency or hematuria.  ED Course: Initial vital signs in emergency department temperature 36.5C, pulse 62, blood pressure 135/62 mmHg, respirations have been between 12-18/m and O2 sat on was initially 96% on room air. Workup in the emergency department shows WBC of 5.1 with a normal differential, hemoglobin 11.3 g/dL and platelets 167. Her d-dimer was greater than 20. Sodium 139, potassium 4.3, chloride 104 and bicarbonate 29 mmol/L. BUN 20, creatinine 1.39, calcium 8.2 and glucose 101 mg/dL. EKG  show atrial fibrillation with RBBB and is basically unchanged from previous tracing.  Imaging: Chest radiograph did not show any acute cardiopulmonary pathology. CT chest angiogram did not show pulmonary embolus, but showed findings consistent with pulmonary hypertension and previously known ascending thoracic aneurysm. Please see images and full radiology report for further details.  Medications in ED: Supplemental oxygen via nasal cannula and furosemide 20 mg IVP 1.  Review of Systems: As per HPI otherwise 10 point review of systems negative.    Past Medical History:  Diagnosis Date  . Aneurysm of aorta (HCC)   . Aneurysm, thoracic aortic ([place])   . Aortic insufficiency   . Ascending aortic aneurysm ([place])    CT in 2012 - 4.2x4.2cm  . Atrial fibrillation ([place])   . Bladder infection    h/o  . Coronary artery disease   . 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 Dehydration    HISTORY   . Fall at home 09/10/12  . Hemorrhoids   . Hip fracture ([place])    hip surgery 2001  . History of blood clots    in leg  . History of knee surgery   . Hypertension   . IDA (iron deficiency anemia) 06/07/2013  . Iron deficiency anemia, unspecified 03/14/2013   secondary to GI blood loss  . Kidney infection    h/o  . Melanoma of skin ([place]) 03/14/2013  . Melanoma of thigh ([place])    left  . Mini stroke ([place])   . S/P colonoscopy March 2010   RMR: friable anal canal hemorrhoids, hyperplastic ascending polyp, adenomatous descending polyp   . Stomach ulcer    secondary to h.pylori, s/p treatment  . Thyroid condition   . Venous stasis    edema  Past Surgical History:  Procedure Laterality Date  . APPENDECTOMY  1942  . BREAST LUMPECTOMY  1998  . CARDIAC CATHETERIZATION    . CATARACT EXTRACTION, BILATERAL    . CHOLECYSTECTOMY    . COLONOSCOPY  06/05/11   pancolonic diverticulosis/ileal reosion/abnormal anorectal junction s/p biopsy: path for small intestine and Ti was  benign with non-villous atrophy, rectal biopsy with prominent prolapse changes, no acute inflammation  . CORNEAL TRANSPLANT Bilateral 2013  . ENTEROSCOPY N/A 06/13/2013   HUT:MLYY Gastritis/GI BLEED MOST LIKELY DUE TO DUODENAL ULCERS, AND ? AVMs  . ESOPHAGOGASTRODUODENOSCOPY  06/05/11   small hiatal hernia; + H.PYLORI GASTRITIS, s/p 5 days of Pylera, unable to finish due to N/V  . ESOPHAGOGASTRODUODENOSCOPY N/A 06/13/2013   Dr. Oneida Alar- see enteroscopy  . ESOPHAGOGASTRODUODENOSCOPY (EGD) WITH ESOPHAGEAL DILATION N/A 06/08/2013   TKP:TWSFK hiatal hernia;  otherwise normal EGD s/p dilation  . GIVENS CAPSULE STUDY N/A 06/08/2013   Procedure: GIVENS CAPSULE STUDY;  Surgeon: Daneil Dolin, MD;  [facility];  Service: Endoscopy;  Laterality: N/A;  . HEMORRHOID BANDING    . KNEE SURGERY Right 05/2006   total right  . LEFT HEART CATH AND CORONARY ANGIOGRAPHY N/A 03/18/2017   Procedure: LEFT HEART CATH AND CORONARY ANGIOGRAPHY;  Surgeon: Wellington Hampshire, MD;  Location: [facility];  Service: Cardiovascular;  Laterality: N/A;  . LEG SURGERY    . MELANOMA EXCISION Left 08/2006   left leg excision  . NM MYOCAR PERF WALL MOTION  03/27/2010   dipyridamole; small reversible basal to mid-septal defect (?artifact), post-stress EF 55%, low risk scan   . PARTIAL HYSTERECTOMY  1976  . TONSILLECTOMY    . TOTAL HIP ARTHROPLASTY Left 2001&2004    X 2 FOR LEFT HIP  . TRANSTHORACIC ECHOCARDIOGRAM  10/06/2012   EF 55-60%, mod eccentric hypertrophy, grade 2 diastolic dysfunction; mildly calcifed AV annulus with moderate regurg; aortic root mildly dilated; LA severely dailted; PA peak pressure 76mHg  . VARICOSE VEIN SURGERY       reports that she quit smoking about 41 years ago. Her smoking use included Cigarettes. She has a 30.00 pack-year smoking history. She has never used smokeless tobacco. She reports that she does not drink alcohol or use drugs.  Allergies  Allergen Reactions  . Codeine  Diarrhea and Nausea Only  . Tape Other (See Comments)    SKIN IS VERY THIN AND TEARS AND BRUISES EASILY; PLEASE USE COBAN WRAP!!  . Diona Fanti [Aspirin] Rash and Other (See Comments)    Petechia   . Iron Nausea And Vomiting    Oral iron causes nausea and vomiting  . Penicillins Rash    Has patient had a PCN reaction causing immediate rash, facial/tongue/throat swelling, SOB or lightheadedness with hypotension: Yes Has patient had a PCN reaction causing severe rash involving mucus membranes or skin necrosis: Unknown Has patient had a PCN reaction that required hospitalization: No Has patient had a PCN reaction occurring within the last 10 years: No If all of the above answers are "NO", then may proceed with Cephalosporin use.     Family History  Problem Relation Age of Onset  . Breast cancer Daughter        also hyperlipidemia  . Breast cancer Daughter        also hyperlipidemia  . Asthma Mother   . Multiple sclerosis Child   . Hyperlipidemia Child   . Colon cancer Neg Hx     Prior to Admission medications  Medication Sig Start Date End Date Taking? Authorizing Provider  albuterol (PROAIR HFA) 108 (90 Base) MCG/ACT inhaler Inhale 1-2 puffs into the lungs every 6 (six) hours as needed for wheezing or shortness of breath.   Yes [provider]  carvedilol (COREG) 3.125 MG tablet Take 1 tablet (3.125 mg total) by mouth 2 (two) times daily with a meal. 03/19/17  Yes Vann, Jessica U, DO  clopidogrel (PLAVIX) 75 MG tablet Take 75 mg by mouth daily with breakfast.   Yes [provider]  cyanocobalamin ([employer] VITAMIN B12) 2000 MCG tablet Take 1 tablet (2,000 mcg total) by mouth daily. 01/26/17  Yes Baird Cancer, PA-C  erythromycin ophthalmic ointment 1 application as needed (for eye infection).  03/10/17  Yes [provider]  folic acid (FOLVITE) 1 MG tablet Take 1 tablet (1 mg total) by mouth daily. 01/26/17  Yes Kefalas, Manon Hilding, PA-C  furosemide (LASIX) 20 MG tablet  Take 1 tablet (20 mg total) by mouth daily as needed for edema. Patient taking differently: Take 20 mg by mouth 2 (two) times daily.  03/19/17  Yes Vann, Jessica U, DO  gabapentin (NEURONTIN) 100 MG capsule TAKE 2 CAPSULES BY MOUTH 3  TIMES DAILY Patient taking differently: Take 200 mg by mouth three times a day 08/05/16  Yes Carole Civil, MD  hydrocortisone (ANUSOL-HC) 25 MG suppository Place 1 suppository (25 mg total) rectally every 12 (twelve) hours. Patient taking differently: Place 25 mg rectally 2 (two) times daily.  05/16/15  Yes Annitta Needs, NP  ondansetron (ZOFRAN) 4 MG tablet Take 1 tablet (4 mg total) by mouth every 8 (eight) hours as needed for nausea or vomiting. Patient taking differently: Take 4 mg by mouth daily before breakfast.  09/13/13  Yes Mahala Menghini, PA-C  pantoprazole (PROTONIX) 40 MG tablet Take 1 tablet (40 mg total) by mouth 2 (two) times daily. 02/09/15  Yes Annitta Needs, NP  potassium chloride SA (K-DUR,KLOR-CON) 20 MEQ tablet Take 2 tablets (40 mEq total) by mouth daily as needed (when taking lasix). 03/19/17  Yes Vann, Jessica U, DO  prednisoLONE acetate (PRED FORTE) 1 % ophthalmic suspension Place 1 drop into both eyes 4 (four) times daily.    Yes [provider]  psyllium (METAMUCIL) 58.6 % packet Take 1 packet by mouth daily as needed (for constipation).    Yes [provider]  sodium chloride (MURO 128) 2 % ophthalmic solution Place 1 drop into both eyes 3 (three) times daily.    Yes [provider]  traMADol-acetaminophen (ULTRACET) 37.5-325 MG tablet Take 1 tablet by mouth every 6 (six) hours as needed for moderate pain or severe pain. 03/19/17  Yes Geradine Girt, DO    Physical Exam: Vitals:   04/02/17 1909 04/02/17 1930 04/02/17 2030 04/02/17 2100  BP: (!) 129/53 (!) 131/54 112/61 (!) 141/55  Pulse: (!) 51 65 63 64  Resp: 18 18 20 16   Temp:      TempSrc:      SpO2: 100% 98% 100% 100%  Weight:      Height:         Constitutional: NAD, calm, comfortable Eyes: PERRL, lids and conjunctivae normal ENMT: Mucous membranes are moist. Posterior pharynx clear of any exudate or lesions. Neck: normal, supple, no masses, no thyromegaly Respiratory: Bibasilar crackles, no wheezing crackles. Normal respiratory effort. No accessory muscle use.  Cardiovascular: Irregularly irregular, soft 1/6 diastolic murmur, no rubs / gallops. Positive lymphedema, trace bilateral lower extremity  edema. Positive extensive varicose veins in lower extremities. 2+ pedal pulses. No carotid bruits.  Abdomen: Soft, no tenderness, no masses palpated. No hepatosplenomegaly. Bowel sounds positive.  Musculoskeletal: no clubbing / cyanosis. Good ROM, no contractures. Normal muscle tone.  Skin: no significant rashes, lesions, ulcers on limited skin exam. Neurologic: CN 2-12 grossly intact. Sensation intact, DTR normal. Strength 5/5 in all 4.  Psychiatric: Normal judgment and insight. Alert and oriented x 4. Normal mood.    Labs on Admission: I have personally reviewed following labs and imaging studies  CBC:  Recent Labs Lab 04/01/17 1410 04/02/17 1703  WBC 4.9 5.1  NEUTROABS 2.8 3.0  HGB 10.9* 11.3*  HCT 34.2* 35.3*  MCV 97.2 97.0  PLT 165 937   Basic Metabolic Panel:  Recent Labs Lab 04/01/17 1410 04/02/17 1703  NA 139 139  K 4.1 4.3  CL 104 104  CO2 28 29  GLUCOSE 122* 101*  BUN 19 20  CREATININE 1.30* 1.39*  CALCIUM 8.3* 8.2*   GFR: Estimated Creatinine Clearance: 26.5 mL/min (A) (by C-G formula based on SCr of 1.39 mg/dL (H)). Liver Function Tests:  Recent Labs Lab 04/02/17 1703  AST 17  ALT 8*  ALKPHOS 62  BILITOT 0.7  PROT 6.5  ALBUMIN 3.7   No results for input(s): LIPASE, AMYLASE in the last 168 hours. No results for input(s): AMMONIA in the last 168 hours. Coagulation Profile: No results for input(s): INR, PROTIME in the last 168 hours. Cardiac Enzymes:  Recent Labs Lab 04/02/17 1703   TROPONINI <0.03   BNP (last 3 results) No results for input(s): PROBNP in the last 8760 hours. HbA1C: No results for input(s): HGBA1C in the last 72 hours. CBG: No results for input(s): GLUCAP in the last 168 hours. Lipid Profile: No results for input(s): CHOL, HDL, LDLCALC, TRIG, CHOLHDL, LDLDIRECT in the last 72 hours. Thyroid Function Tests: No results for input(s): TSH, T4TOTAL, FREET4, T3FREE, THYROIDAB in the last 72 hours. Anemia Panel: No results for input(s): VITAMINB12, FOLATE, FERRITIN, TIBC, IRON, RETICCTPCT in the last 72 hours. Urine analysis:    Component Value Date/Time   COLORURINE YELLOW 05/18/2016 1529   APPEARANCEUR CLEAR 05/18/2016 1529   LABSPEC 1.025 05/18/2016 1529   PHURINE 6.0 05/18/2016 1529   GLUCOSEU NEGATIVE 05/18/2016 1529   HGBUR LARGE (A) 05/18/2016 1529   BILIRUBINUR SMALL (A) 05/18/2016 1529   KETONESUR NEGATIVE 05/18/2016 1529   PROTEINUR TRACE (A) 05/18/2016 1529   UROBILINOGEN 1.0 12/18/2014 1735   NITRITE NEGATIVE 05/18/2016 1529   LEUKOCYTESUR MODERATE (A) 05/18/2016 1529    Radiological Exams on Admission: Dg Chest 2 View  Result Date: 04/02/2017 CLINICAL DATA:  Shortness of breath.  Chest pain. EXAM: CHEST  2 VIEW COMPARISON:  March 16, 2017 FINDINGS: Stable cardiomegaly. The hila and mediastinum are unchanged. No nodules, masses, or focal infiltrates within the lungs. Probable tiny effusions or pleural thickening seen on the lateral view. Anterior wedging of an upper thoracic vertebral body is stable. IMPRESSION: No active cardiopulmonary disease. Electronically Signed   By: Dorise Bullion III M.D   On: 04/02/2017 17:52   Ct Angio Chest Pe W And/or Wo Contrast  Result Date: 04/02/2017 CLINICAL DATA:  Chest pain or shortness of breath EXAM: CT ANGIOGRAPHY CHEST WITH CONTRAST TECHNIQUE: Multidetector CT imaging of the chest was performed using the standard protocol during bolus administration of intravenous contrast. Multiplanar CT  image reconstructions and MIPs were obtained to evaluate the vascular anatomy. CONTRAST:  65 mL Isovue 370  intravenous COMPARISON:  04/02/2017 FINDINGS: Cardiovascular: Aneurysmal dilatation of the ascending aorta up to 4.6 cm. Atherosclerotic calcifications. Coronary artery calcifications. Cardiomegaly. No pericardial effusion. Satisfactory opacification of the pulmonary arterial system. Negative for acute filling defect to suggest pulmonary embolus. Enlarged pulmonary trunk up to 4.5 cm. Mediastinum/Nodes: Prominent heterogenous thyroid. Midline trachea. 1 cm precarinal lymph node. Esophagus within normal limits Lungs/Pleura: Linear atelectasis or scarring at the lingula and right apex. Negative for effusion or consolidation. Negative for pneumothorax. Upper Abdomen: Reflux of contrast into the hepatic veins. Partially visualized cyst midpole left kidney. Musculoskeletal: Osteopenia.  Mild to moderate compression of T7. Review of the MIP images confirms the above findings. IMPRESSION: 1. Negative for acute pulmonary embolus. 2. Enlarged pulmonary arterial trunk consistent with pulmonary hypertension. 3. Aneurysmal dilatation of the ascending aorta up to 4.6 cm. Ascending thoracic aortic aneurysm. Recommend semi-annual imaging followup by CTA or MRA and referral to cardiothoracic surgery if not already obtained. This recommendation follows 2010 ACCF/AHA/AATS/ACR/ASA/SCA/SCAI/SIR/STS/SVM Guidelines for the Diagnosis and Management of Patients With Thoracic Aortic Disease. Circulation. 2010; 121: H371-I967 4. Clear lung fields aside from areas of pulmonary scarring 5. Mild to moderate compression of T7, no change from recent CT Aortic aneurysm NOS (ICD10-I71.9). Aortic Atherosclerosis (ICD10-I70.0). Electronically Signed   By: Donavan Foil M.D.   On: 04/02/2017 19:11    EKG: Independently reviewed Vent. rate 58 BPM PR interval * ms QRS duration 147 ms QT/QTc 528/519 ms P-R-T axes * 249 -84 Atrial  fibrillation RBBB and LAFB Abnormal T, probable ischemia, widespread  Assessment/Plan Principal Problem:   Acute on chronic systolic heart failure (HCC) Observation/telemetry. Continue supplemental oxygen. Furosemide 20 mg IVP every 8 hours. Potassium supplementation while on furosemide. Monitor intake and output. Daily weights. Continue low-dose carvedilol. Cardiology recommended low-dose lisinopril, if creatinine improves. However, her GFR today has decreased to 32 mL/min. Monitor renal function and electrolytes.  Active Problems:   Chest pain Continue cardiac telemetry. Trend troponin levels. Continue beta blocker and antiplatelet therapy.    IDA (iron deficiency anemia) Monitor hematocrit and hemoglobin. Iron supplementation as needed. Continue folic acid, multivitamin and vitamin B12 supplementation.      Atrial fibrillation, chronic (HCC) CHA?DS?-VASc Score of at least 8. Not on anticoagulation due to history of GI bleed. Continue carvedilol for rate control. Monitor heart rate and electrolytes.    Coronary artery disease Nonobstructive CAD on cardiac cath done earlier this month. Continue carvedilol and clopidogrel.    Hypertension Continue carvedilol 3.125 mg by mouth twice a day. Monitor blood pressure, renal function and electrolytes.    DVT prophylaxis: Heparin SQ. Code Status: DO NOT RESUSCITATE. Family Communication: Her 2 daughters are present in the emergency department. Disposition Plan: Observation for furosemide-induced diuresis, cardiac monitoring and troponin levels trending.  Consults called:  Admission status: Observation/telemetry.   Reubin Milan MD [facility] Pager [phone].  If 7PM-7AM, please contact night-coverage [email or web address] Password [email or web address]  04/02/2017, 9:19 PM

## 2017-04-02 NOTE — ED Notes (Signed)
EKG given to Dr. McManus.  

## 2017-04-02 NOTE — ED Notes (Signed)
Oxygen was noted 88% on RA with good pleth. Patient placed in 2 LPM of oxygen via [place], oxygen increased to 99%. NAD.

## 2017-04-02 NOTE — ED Notes (Signed)
Patient to CT at this time

## 2017-04-03 ENCOUNTER — Observation Stay (HOSPITAL_COMMUNITY): Payer: Medicare Other

## 2017-04-03 DIAGNOSIS — I5023 Acute on chronic systolic (congestive) heart failure: Secondary | ICD-10-CM | POA: Diagnosis not present

## 2017-04-03 DIAGNOSIS — D509 Iron deficiency anemia, unspecified: Secondary | ICD-10-CM | POA: Diagnosis not present

## 2017-04-03 DIAGNOSIS — I4510 Unspecified right bundle-branch block: Secondary | ICD-10-CM

## 2017-04-03 DIAGNOSIS — I482 Chronic atrial fibrillation: Secondary | ICD-10-CM | POA: Diagnosis not present

## 2017-04-03 DIAGNOSIS — I82811 Embolism and thrombosis of superficial veins of right lower extremities: Secondary | ICD-10-CM | POA: Diagnosis not present

## 2017-04-03 DIAGNOSIS — J9601 Acute respiratory failure with hypoxia: Secondary | ICD-10-CM | POA: Diagnosis not present

## 2017-04-03 DIAGNOSIS — I712 Thoracic aortic aneurysm, without rupture: Secondary | ICD-10-CM | POA: Diagnosis not present

## 2017-04-03 LAB — BASIC METABOLIC PANEL
Anion gap: 7 (ref 5–15)
BUN: 19 mg/dL (ref 6–20)
CALCIUM: 8.3 mg/dL — AB (ref 8.9–10.3)
CO2: 32 mmol/L (ref 22–32)
CREATININE: 1.31 mg/dL — AB (ref 0.44–1.00)
Chloride: 102 mmol/L (ref 101–111)
GFR calc Af Amer: 40 mL/min — ABNORMAL LOW (ref >60)
GFR, EST NON AFRICAN AMERICAN: 34 mL/min — AB (ref >60)
Glucose, Bld: 89 mg/dL (ref 65–99)
Potassium: 3.6 mmol/L (ref 3.5–5.1)
SODIUM: 141 mmol/L (ref 135–145)

## 2017-04-03 LAB — TROPONIN I

## 2017-04-03 NOTE — Care Management Obs Status (Signed)
[place] NOTIFICATION   Patient Details  Name: VANDELLA ORD MRN: [id number] Date of Birth: [birth year]   Medicare Observation Status Notification Given:  Yes    Sherald Barge, RN 04/03/2017, 9:38 AM

## 2017-04-03 NOTE — Care Management Note (Signed)
Case Management Note  Patient Details  Name: [name] [name] MRN: [id number] Date of Birth: 1922/09/22  Subjective/Objective:                  Admitted with CHF. Pt from home, lives alone. Has 2 dtr who are very supportive, they take turns preparing meals and cleaning pt's home. Pt also receives MOW. Pt uses ambulates with RW. She is on oxygen acutely. Pt's daughter at the bedside. Request HH RN to f/u after DC to make sure they are "doing what we're supposed to". Request [facility]. Aware that [facility] has 48hrs to make first visit.   Action/Plan: Vaughan Basta, [facility] rep, aware of referral and will obtain info. If pt discharges over weekend. Will need orders faxed to [facility] and they be notified pt has discharged. CM will cont to follow if not DC'd over weekend.   Expected Discharge Date:  04/04/17               Expected Discharge Plan:  [place]  In-House Referral:  NA  Discharge planning Services  CM Consult  Post Acute Care Choice:  Home Health Choice offered to:  Patient  HH Arranged:  RN, PT [facility] Agency:  [place]  Status of Service:  Completed, signed off  Sherald Barge, RN 04/03/2017, 1:22 PM

## 2017-04-03 NOTE — Evaluation (Signed)
Physical Therapy Evaluation Patient Details Name: [name] [name] MRN: [id number] DOB: November 25, 1922 Today's Date: 04/03/2017   History of Present Illness  [name] [name] is a 83 y.o. female with medical history significant of ascending thoracic aneurysm, aortic valve insufficiency, chronic atrial fibrillation, nonobstructive CAD, varicose veins, lymphedema, history of breast cancer, hemorrhoids, history of hip fracture, history of DVTs, hypertension, iron deficiency anemia, history of pyelonephritis, history of melanoma of left thigh, TIA, PUD, history of thyroid condition ( has not been on levothyroxine for a long time) who is coming to the emergency department with complaints of progressively worse shortness of breath since yesterday. Associated symptoms are orthopnea, occasionally nonproductive cough and lower extremity edema, for which she has been taking furosemide twice a day as needed along with potassium supplementation. While in the ER, the patient had transient right-sided chest pressure, nonradiating with no changes on repeat EKG. She denies diaphoresis, worsening of dyspnea, palpitations, nausea or emesis. No fever, chills, sore throat, productive cough, wheezing, hemoptysis, abdominal pain, diarrhea, melena or hematochezia. She has frequent constipation, but denies dysuria, frequency or hematuria.    Clinical Impression  Patient near baseline and demonstrates slightly labored cadence during gait without loss of balance and will benefit from continued therapy for reasons below.  Patient will benefit from continued physical therapy in hospital and recommended venue below to increase strength, balance, endurance for safe ADLs and gait.    Follow Up Recommendations Home health PT;Supervision for mobility/OOB    Equipment Recommendations  None recommended by PT    Recommendations for Other Services       Precautions / Restrictions Precautions Precautions: Fall Restrictions Weight  Bearing Restrictions: No      Mobility  Bed Mobility Overal bed mobility: Needs Assistance Bed Mobility: Supine to Sit;Sit to Supine     Supine to sit: Supervision Sit to supine: Supervision      Transfers Overall transfer level: Needs assistance Equipment used: Rolling walker (2 wheeled) Transfers: Sit to/from Stand Sit to Stand: Min guard            Ambulation/Gait Ambulation/Gait assistance: Min guard Ambulation Distance (Feet): 25 Feet Assistive device: Rolling walker (2 wheeled) Gait Pattern/deviations: Decreased step length - left;Decreased step length - right;Decreased stride length   Gait velocity interpretation: Below normal speed for age/gender General Gait Details: Demonstrates slighlty labored slow cadence without loss of balance, limited secondary to c/o fatigue  Stairs            Wheelchair Mobility    Modified Rankin (Stroke Patients Only)       Balance Overall balance assessment: Needs assistance Sitting-balance support: Feet unsupported;No upper extremity supported Sitting balance-Leahy Scale: Good     Standing balance support: Bilateral upper extremity supported;During functional activity Standing balance-Leahy Scale: Fair                               Pertinent Vitals/Pain Pain Assessment: No/denies pain    Home Living Family/patient expects to be discharged to:: Private residence Living Arrangements: Alone Available Help at Discharge: Family Type of Home: Apartment Home Access: Stairs to enter Entrance Stairs-Rails: None Entrance Stairs-Number of Steps: 1 step up onto the porch.   Home Layout: One level Home Equipment: [place] - 4 wheels;Walker - 2 wheels;Shower seat      Prior Function Level of Independence: Independent with assistive device(s)               Hand  Dominance   Dominant Hand: Right    Extremity/Trunk Assessment   Upper Extremity Assessment Upper Extremity Assessment: Overall WFL  for tasks assessed    Lower Extremity Assessment Lower Extremity Assessment: Overall WFL for tasks assessed    Cervical / Trunk Assessment Cervical / Trunk Assessment: Normal  Communication   Communication: HOH  Cognition Arousal/Alertness: Awake/alert Behavior During Therapy: WFL for tasks assessed/performed Overall Cognitive Status: Within Functional Limits for tasks assessed                                        General Comments      Exercises     Assessment/Plan    PT Assessment Patient needs continued PT services  PT Problem List Decreased strength;Decreased activity tolerance;Decreased balance;Decreased mobility       PT Treatment Interventions Gait training;Stair training;Functional mobility training;Therapeutic activities;Therapeutic exercise;Patient/family education    PT Goals (Current goals can be found in the Care Plan section)  Acute Rehab PT Goals Patient Stated Goal: Return home with family to assist PT Goal Formulation: With patient/family Time For Goal Achievement: 04/10/17 Potential to Achieve Goals: Good    Frequency Min 3X/week   Barriers to discharge        Co-evaluation               AM-PAC PT "6 Clicks" Daily Activity  Outcome Measure Difficulty turning over in bed (including adjusting bedclothes, sheets and blankets)?: None Difficulty moving from lying on back to sitting on the side of the bed? : None Difficulty sitting down on and standing up from a chair with arms (e.g., wheelchair, bedside commode, etc,.)?: None Help needed moving to and from a bed to chair (including a wheelchair)?: A Little Help needed walking in hospital room?: A Little Help needed climbing 3-5 steps with a railing? : A Little 6 Click Score: 21    End of Session Equipment Utilized During Treatment: Gait belt Activity Tolerance: Patient tolerated treatment well;Patient limited by fatigue Patient left: in bed;with call bell/phone within  reach;with family/visitor present Nurse Communication: Mobility status PT Visit Diagnosis: Unsteadiness on feet (R26.81);Other abnormalities of gait and mobility (R26.89);Muscle weakness (generalized) (M62.81)    Time: 6144-3154 PT Time Calculation (min) (ACUTE ONLY): 34 min   Charges:   PT Evaluation $PT Eval Low Complexity: 1 Low PT Treatments $Therapeutic Activity: 23-37 mins   PT G Codes:   PT G-Codes **NOT FOR INPATIENT CLASS** Functional Assessment Tool Used: AM-PAC 6 Clicks Basic Mobility Functional Limitation: Mobility: Walking and moving around Mobility: Walking and Moving Around Current Status (M0867): At least 20 percent but less than 40 percent impaired, limited or restricted Mobility: Walking and Moving Around Goal Status [phone]): At least 20 percent but less than 40 percent impaired, limited or restricted Mobility: Walking and Moving Around Discharge Status [phone]): At least 20 percent but less than 40 percent impaired, limited or restricted    2:38 PM, 04/03/17 Lonell Grandchild, MPT Physical Therapist with [facility] [phone] office [phone] mobile phone

## 2017-04-03 NOTE — Progress Notes (Signed)
PROGRESS NOTE  DESIRA ALESSANDRINI EGB:[id number] DOB: 02/02/1923 DOA: 04/02/2017 PCP: [name], MD  Brief History:  83 year old female with a history of stress-induced cardiomyopathy with EF 35-40 percent, nonobstructive CAD, chronic atrial fibrillation, R-DCIS, iron deficiency anemia, hypertension, hyperlipidemia, TIA, ascending aortic aneurysm presenting with one-day history of shortness of breath, nonproductive cough, and orthopnea type symptoms. The patient saw cardiology on 04/01/2017 at which time, there was noted weight gain and lower extremity edema. The patient was instructed to increase her furosemide to 20 mg twice a day. The patient was discharged from the hospital after a stay from 03/16/2017 through 03/19/2017 during which time the patient underwent a cardiac catheterization revealing nonobstructive CAD. The patient was instructed to start furosemide 20 mg daily at the time of that discharge. Her daughter states that the patient did not start her furosemide until approximately one week prior to this admission. During her office visit on 04/01/2017, there was concern about dietary indiscretion. Chest x-ray at the time of admission revealed increasing pulmonary venous congestion. CT angiogram of the chest was negative for pulmonary embolus. After admission, the patient was started on intravenous furosemide with good clinical effect.  Assessment/Plan: Acute on chronic systolic CHF -37/05/6268 discharge weight 164 -04/02/2017 admission weight 170 -Continue intravenous furosemide 20 mg IV q 8 -clinically R-sided CHF -accurate I/O's -fluid restrict -Echo--EF 35-40%, dyskinesis mid apical anteroseptal, moderate TR, PAS 4042 -continue IV Lasix -NEG 2.8 L for the admission, question accuracy--no intake documented  -03/18/2017 heart catheterization--mild nonobstructive CAD -Continue carvedilol  Acute respiratory failure with hypoxia -Secondary to pulmonary edema -Stable on  2 L -CTA chest neg for PE  Chronic atrial fibrillation -Rate controlled -Continue carvedilol -CHADSVASc = 8 -not on AC due to GIB and iron deficiency anemia -Continue Plavix  CKD stage 3 -Baseline creatinine 1.0-1.3 -Monitor with diuresis  Iron deficiency anemia -Repeat iron studies  -Baseline hemoglobin approximately 12   Lower extremity edema/pain -with elevated D-dimer--check venous duplex  Ascending aortic aneurysm -Measuring 4.5 cm on CT angiogram chest -poor surgical candidate -continue outpt surveillance   Disposition Plan:   Home in 1-2 days  Family Communication:   Daughter update at bedside--Total time spent 35 minutes.  Greater than 50% spent face to face counseling and coordinating care.  Consultants:  none  Code Status:  FULL  DVT Prophylaxis:  [place] Heparin   Procedures: As Listed in Progress Note Above  Antibiotics: None    Subjective: Patient denies fevers, chills, headache, chest pain, dyspnea, nausea, vomiting, diarrhea, abdominal pain, dysuria, hematuria, hematochezia, and melena.   Objective: Vitals:   04/03/17 0220 04/03/17 0623 04/03/17 0644 04/03/17 0707  BP: (!) 120/39 (!) 107/55    Pulse: 63 (!) 54 60   Resp: 18 18    Temp: 97.8 F (36.6 C) 97.6 F (36.4 C)    TempSrc: Oral Oral    SpO2: 99% 95%    Weight:    76 kg (167 lb 8.8 oz)  Height:        Intake/Output Summary (Last 24 hours) at 04/03/17 0836 Last data filed at 04/03/17 4854  Gross per 24 hour  Intake                0 ml  Output             2850 ml  Net            -2850 ml   Weight change:  Exam:   General:  Pt is alert, follows commands appropriately, not in acute distress  HEENT: No icterus, No thrush, No neck mass, [place]/AT  Cardiovascular: RRR, S1/S2, no rubs, no gallops  Respiratory: bibasilar crackles, no wheeze  Abdomen: Soft/+BS, non tender, non distended, no guarding  Extremities: 1 + LE edema, No lymphangitis, No petechiae, No rashes, no  synovitis   Data Reviewed: I have personally reviewed following labs and imaging studies Basic Metabolic Panel:  Recent Labs Lab 04/01/17 1410 04/02/17 1703 04/03/17 0558  NA 139 139 141  K 4.1 4.3 3.6  CL 104 104 102  CO2 28 29 32  GLUCOSE 122* 101* 89  BUN 19 20 19   CREATININE 1.30* 1.39* 1.31*  CALCIUM 8.3* 8.2* 8.3*   Liver Function Tests:  Recent Labs Lab 04/02/17 1703  AST 17  ALT 8*  ALKPHOS 62  BILITOT 0.7  PROT 6.5  ALBUMIN 3.7   No results for input(s): LIPASE, AMYLASE in the last 168 hours. No results for input(s): AMMONIA in the last 168 hours. Coagulation Profile: No results for input(s): INR, PROTIME in the last 168 hours. CBC:  Recent Labs Lab 04/01/17 1410 04/02/17 1703  WBC 4.9 5.1  NEUTROABS 2.8 3.0  HGB 10.9* 11.3*  HCT 34.2* 35.3*  MCV 97.2 97.0  PLT 165 167   Cardiac Enzymes:  Recent Labs Lab 04/02/17 1703 04/02/17 2358 04/03/17 0558  TROPONINI <0.03 <0.03 <0.03   BNP: Invalid input(s): POCBNP CBG: No results for input(s): GLUCAP in the last 168 hours. HbA1C: No results for input(s): HGBA1C in the last 72 hours. Urine analysis:    Component Value Date/Time   COLORURINE COLORLESS (A) 04/02/2017 2222   APPEARANCEUR CLEAR 04/02/2017 2222   LABSPEC 1.009 04/02/2017 2222   PHURINE 5.0 04/02/2017 2222   GLUCOSEU NEGATIVE 04/02/2017 2222   HGBUR LARGE (A) 04/02/2017 2222   BILIRUBINUR NEGATIVE 04/02/2017 2222   KETONESUR NEGATIVE 04/02/2017 2222   PROTEINUR NEGATIVE 04/02/2017 2222   UROBILINOGEN 1.0 12/18/2014 1735   NITRITE NEGATIVE 04/02/2017 2222   LEUKOCYTESUR NEGATIVE 04/02/2017 2222   Sepsis Labs: @LABRCNTIP (procalcitonin:4,lacticidven:4) )No results found for this or any previous visit (from the past 240 hour(s)).   Scheduled Meds: . carvedilol  3.125 mg Oral BID WC  . clopidogrel  75 mg Oral Q breakfast  . folic acid  1 mg Oral Daily  . furosemide  20 mg Intravenous Q8H  . gabapentin  200 mg Oral TID  .  heparin  5,000 Units Subcutaneous Q8H  . pantoprazole  40 mg Oral BID  . potassium chloride SA  40 mEq Oral Daily  . prednisoLONE acetate  1 drop Both Eyes QID  . sodium chloride  1 drop Both Eyes TID   Continuous Infusions:  Procedures/Studies: Dg Chest 2 View  Result Date: 04/02/2017 CLINICAL DATA:  Shortness of breath.  Chest pain. EXAM: CHEST  2 VIEW COMPARISON:  March 16, 2017 FINDINGS: Stable cardiomegaly. The hila and mediastinum are unchanged. No nodules, masses, or focal infiltrates within the lungs. Probable tiny effusions or pleural thickening seen on the lateral view. Anterior wedging of an upper thoracic vertebral body is stable. IMPRESSION: No active cardiopulmonary disease. Electronically Signed   By: Dorise Bullion III M.D   On: 04/02/2017 17:52   Dg Chest 2 View  Result Date: 03/16/2017 CLINICAL DATA:  83 year old female with chest pain and shortness of breath for 3 days. EXAM: CHEST  2 VIEW COMPARISON:  05/18/2016 FINDINGS: Cardiomediastinal silhouette is stable in size  and configuration. Coarsened interstitial markings appear similar to prior examination. There is no focal airspace opacity, pleural effusion or pneumothorax. Chronic and degenerative osseous changes appear stable, including anterior wedge compression deformity of an upper vertebra. No acute osseous abnormality. IMPRESSION: 1. No active cardiopulmonary disease. 2. Stable cardiomegaly and coarsened interstitial markings. 3. Stable chronic and degenerative osseous changes. Electronically Signed   By: Kristopher Oppenheim M.D.   On: 03/16/2017 12:29   Ct Angio Chest Pe W And/or Wo Contrast  Result Date: 04/02/2017 CLINICAL DATA:  Chest pain or shortness of breath EXAM: CT ANGIOGRAPHY CHEST WITH CONTRAST TECHNIQUE: Multidetector CT imaging of the chest was performed using the standard protocol during bolus administration of intravenous contrast. Multiplanar CT image reconstructions and MIPs were obtained to evaluate the  vascular anatomy. CONTRAST:  65 mL Isovue 370 intravenous COMPARISON:  04/02/2017 FINDINGS: Cardiovascular: Aneurysmal dilatation of the ascending aorta up to 4.6 cm. Atherosclerotic calcifications. Coronary artery calcifications. Cardiomegaly. No pericardial effusion. Satisfactory opacification of the pulmonary arterial system. Negative for acute filling defect to suggest pulmonary embolus. Enlarged pulmonary trunk up to 4.5 cm. Mediastinum/Nodes: Prominent heterogenous thyroid. Midline trachea. 1 cm precarinal lymph node. Esophagus within normal limits Lungs/Pleura: Linear atelectasis or scarring at the lingula and right apex. Negative for effusion or consolidation. Negative for pneumothorax. Upper Abdomen: Reflux of contrast into the hepatic veins. Partially visualized cyst midpole left kidney. Musculoskeletal: Osteopenia.  Mild to moderate compression of T7. Review of the MIP images confirms the above findings. IMPRESSION: 1. Negative for acute pulmonary embolus. 2. Enlarged pulmonary arterial trunk consistent with pulmonary hypertension. 3. Aneurysmal dilatation of the ascending aorta up to 4.6 cm. Ascending thoracic aortic aneurysm. Recommend semi-annual imaging followup by CTA or MRA and referral to cardiothoracic surgery if not already obtained. This recommendation follows 2010 ACCF/AHA/AATS/ACR/ASA/SCA/SCAI/SIR/STS/SVM Guidelines for the Diagnosis and Management of Patients With Thoracic Aortic Disease. Circulation. 2010; 121: M196-Q229 4. Clear lung fields aside from areas of pulmonary scarring 5. Mild to moderate compression of T7, no change from recent CT Aortic aneurysm NOS (ICD10-I71.9). Aortic Atherosclerosis (ICD10-I70.0). Electronically Signed   By: Donavan Foil M.D.   On: 04/02/2017 19:11   Ct Angio Chest/abd/pel For Dissection W And/or Wo Contrast  Result Date: 03/16/2017 CLINICAL DATA:  Chest pain radiating into back with some associated shortness of breath. EXAM: CT ANGIOGRAPHY CHEST,  ABDOMEN AND PELVIS TECHNIQUE: Multidetector CT imaging through the chest, abdomen and pelvis was performed using the standard protocol during bolus administration of intravenous contrast. Multiplanar reconstructed images and MIPs were obtained and reviewed to evaluate the vascular anatomy. CONTRAST:  80 mL Isovue 370 IV COMPARISON:  CT of the abdomen and pelvis on 11/06/2015 FINDINGS: CTA CHEST FINDINGS Cardiovascular: There is stable dilatation of the ascending thoracic aorta which measures 4.6 cm in greatest diameter. The aorta measures approximately 4.3 cm at the level of the sinuses of Valsalva. The proximal arch measures 3.6 cm. The distal arch measures 3.0 cm. The descending thoracic aorta measures 2.6 cm. No evidence of aortic dissection or intramural hemorrhage. No evidence of penetrating ulcer disease. The proximal great vessels show tortuosity without significant stenosis or aneurysmal disease. The heart is significantly enlarged. Calcified plaque is noted in the coronary tree in a 3 vessel distribution. There are some mild calcifications at the level of the aortic valve and mitral annulus. No pericardial fluid identified. Central pulmonary arteries are very prominent in caliber with the main pulmonary artery measuring approximately 4.3 cm in diameter. Findings are consistent with underlying  pulmonary hypertension. Mediastinum/Nodes: No evidence of mediastinal, hilar or axillary lymphadenopathy. No mediastinal masses identified. No gross abnormalities involving the trachea or esophagus. Lungs/Pleura: Lungs demonstrate bilateral pulmonary scarring. No evidence of focal airspace consolidation, pulmonary edema or pleural effusions. No pulmonary masses or pneumothorax. Musculoskeletal: Bony structures are unremarkable. Review of the MIP images confirms the above findings. CTA ABDOMEN AND PELVIS FINDINGS VASCULAR Aorta: The abdominal aorta shows scattered plaque without evidence of aneurysm or dissection.  Celiac: Origin stenosis of approximately 50%. SMA: Normally patent. Renals: Mild plaque at the origins of bilateral single renal arteries without evidence of stenosis. IMA: Normally patent. Inflow: Bilateral iliac arteries show moderate tortuosity and mild plaque without evidence of stenosis or aneurysmal disease. The common femoral arteries and femoral bifurcations are normally patent bilaterally. Review of the MIP images confirms the above findings. NON-VASCULAR Hepatobiliary: No focal liver abnormality is seen. Status post cholecystectomy. No biliary dilatation. Pancreas: Unremarkable. No pancreatic ductal dilatation or surrounding inflammatory changes. Spleen: Normal in size without focal abnormality. Adrenals/Urinary Tract: Small lateral interpolar cortical mass visualized on the arterial phase and measures approximately 1.2 cm. This shows no significant growth since the prior study in 2017. Stable cyst of the contralateral left kidney. Adrenal glands appear unremarkable. The bladder is decompressed. Stomach/Bowel: Bowel shows no evidence of obstruction or inflammation. Diverticulosis of the sigmoid colon noted without evidence of acute diverticulitis. Lymphatic: No enlarged lymph nodes identified. Reproductive: Status post hysterectomy. No adnexal masses. Other: No abdominal wall hernia or abnormality. No abdominopelvic ascites. Musculoskeletal: Bones are osteopenic. No fractures or lesions identified. Review of the MIP images confirms the above findings. IMPRESSION: 1. Stable aneurysmal disease of the ascending thoracic aorta which measures 4.6 cm in greatest diameter. There is no evidence of acute aortic dissection or other acute aortic syndrome. 2. Coronary atherosclerosis with calcified plaque in a 3 vessel distribution. 3. Significant cardiac enlargement without pulmonary edema. 4. Evidence of pulmonary hypertension with significant dilatation of the main pulmonary artery and central pulmonary arteries.  5. Stable roughly 1.2 cm right renal mass centered in the lateral interpolar cortex. This shows no significant growth since a prior CT on 11/06/2015. Electronically Signed   By: Aletta Edouard M.D.   On: 03/16/2017 14:25    Ninnie Fein, DO  [facility] Pager [phone]  If 7PM-7AM, please contact night-coverage [email or web address] Password [email or web address] 04/03/2017, 8:36 AM   LOS: 0 days

## 2017-04-04 DIAGNOSIS — I4510 Unspecified right bundle-branch block: Secondary | ICD-10-CM | POA: Diagnosis not present

## 2017-04-04 DIAGNOSIS — I712 Thoracic aortic aneurysm, without rupture: Secondary | ICD-10-CM | POA: Diagnosis not present

## 2017-04-04 DIAGNOSIS — D508 Other iron deficiency anemias: Secondary | ICD-10-CM | POA: Diagnosis not present

## 2017-04-04 DIAGNOSIS — Z880 Allergy status to penicillin: Secondary | ICD-10-CM | POA: Diagnosis not present

## 2017-04-04 DIAGNOSIS — I130 Hypertensive heart and chronic kidney disease with heart failure and stage 1 through stage 4 chronic kidney disease, or unspecified chronic kidney disease: Secondary | ICD-10-CM | POA: Diagnosis present

## 2017-04-04 DIAGNOSIS — Z7902 Long term (current) use of antithrombotics/antiplatelets: Secondary | ICD-10-CM | POA: Diagnosis not present

## 2017-04-04 DIAGNOSIS — Z888 Allergy status to other drugs, medicaments and biological substances status: Secondary | ICD-10-CM | POA: Diagnosis not present

## 2017-04-04 DIAGNOSIS — Z85820 Personal history of malignant melanoma of skin: Secondary | ICD-10-CM | POA: Diagnosis not present

## 2017-04-04 DIAGNOSIS — Z8711 Personal history of peptic ulcer disease: Secondary | ICD-10-CM | POA: Diagnosis not present

## 2017-04-04 DIAGNOSIS — I509 Heart failure, unspecified: Secondary | ICD-10-CM

## 2017-04-04 DIAGNOSIS — E876 Hypokalemia: Secondary | ICD-10-CM | POA: Diagnosis not present

## 2017-04-04 DIAGNOSIS — Z885 Allergy status to narcotic agent status: Secondary | ICD-10-CM | POA: Diagnosis not present

## 2017-04-04 DIAGNOSIS — Z91048 Other nonmedicinal substance allergy status: Secondary | ICD-10-CM | POA: Diagnosis not present

## 2017-04-04 DIAGNOSIS — I5023 Acute on chronic systolic (congestive) heart failure: Secondary | ICD-10-CM | POA: Diagnosis not present

## 2017-04-04 DIAGNOSIS — D509 Iron deficiency anemia, unspecified: Secondary | ICD-10-CM | POA: Diagnosis present

## 2017-04-04 DIAGNOSIS — Z96642 Presence of left artificial hip joint: Secondary | ICD-10-CM | POA: Diagnosis present

## 2017-04-04 DIAGNOSIS — I2510 Atherosclerotic heart disease of native coronary artery without angina pectoris: Secondary | ICD-10-CM | POA: Diagnosis present

## 2017-04-04 DIAGNOSIS — Z87891 Personal history of nicotine dependence: Secondary | ICD-10-CM | POA: Diagnosis not present

## 2017-04-04 DIAGNOSIS — G8929 Other chronic pain: Secondary | ICD-10-CM | POA: Diagnosis present

## 2017-04-04 DIAGNOSIS — I82811 Embolism and thrombosis of superficial veins of right lower extremities: Secondary | ICD-10-CM | POA: Diagnosis present

## 2017-04-04 DIAGNOSIS — I482 Chronic atrial fibrillation: Secondary | ICD-10-CM | POA: Diagnosis not present

## 2017-04-04 DIAGNOSIS — Z886 Allergy status to analgesic agent status: Secondary | ICD-10-CM | POA: Diagnosis not present

## 2017-04-04 DIAGNOSIS — N183 Chronic kidney disease, stage 3 (moderate): Secondary | ICD-10-CM | POA: Diagnosis present

## 2017-04-04 DIAGNOSIS — J9601 Acute respiratory failure with hypoxia: Secondary | ICD-10-CM | POA: Diagnosis not present

## 2017-04-04 DIAGNOSIS — Z79899 Other long term (current) drug therapy: Secondary | ICD-10-CM | POA: Diagnosis not present

## 2017-04-04 DIAGNOSIS — Z853 Personal history of malignant neoplasm of breast: Secondary | ICD-10-CM | POA: Diagnosis not present

## 2017-04-04 DIAGNOSIS — Z66 Do not resuscitate: Secondary | ICD-10-CM | POA: Diagnosis present

## 2017-04-04 DIAGNOSIS — Z8673 Personal history of transient ischemic attack (TIA), and cerebral infarction without residual deficits: Secondary | ICD-10-CM | POA: Diagnosis not present

## 2017-04-04 LAB — TROPONIN I
Troponin I: <0.03 ng/mL (ref <0.03)
Troponin I: <0.03 ng/mL (ref <0.03)

## 2017-04-04 LAB — BASIC METABOLIC PANEL
ANION GAP: 10 (ref 5–15)
BUN: 19 mg/dL (ref 6–20)
CHLORIDE: 100 mmol/L — AB (ref 101–111)
CO2: 31 mmol/L (ref 22–32)
Calcium: 8.2 mg/dL — ABNORMAL LOW (ref 8.9–10.3)
Creatinine, Ser: 1.26 mg/dL — ABNORMAL HIGH (ref 0.44–1.00)
GFR calc non Af Amer: 36 mL/min — ABNORMAL LOW (ref >60)
GFR, EST AFRICAN AMERICAN: 42 mL/min — AB (ref >60)
Glucose, Bld: 93 mg/dL (ref 65–99)
POTASSIUM: 3.5 mmol/L (ref 3.5–5.1)
SODIUM: 141 mmol/L (ref 135–145)

## 2017-04-04 LAB — IRON AND TIBC
IRON: 48 ug/dL (ref 28–170)
SATURATION RATIOS: 14 % (ref 10.4–31.8)
TIBC: 333 ug/dL (ref 250–450)
UIBC: 285 ug/dL

## 2017-04-04 LAB — FERRITIN: Ferritin: 87 ng/mL (ref 11–307)

## 2017-04-04 LAB — MAGNESIUM: MAGNESIUM: 2.1 mg/dL (ref 1.7–2.4)

## 2017-04-04 MED ORDER — TRAMADOL HCL 50 MG PO TABS
50.0000 mg | ORAL_TABLET | Freq: Four times a day (QID) | ORAL | Status: DC | PRN
  Administered 2017-04-04 – 2017-04-05 (×3): 50 mg via ORAL
  Filled 2017-04-04 (×3): qty 1

## 2017-04-04 NOTE — Progress Notes (Signed)
PT Cancellation Note  Patient Details Name: [name] [name] MRN: [id number] DOB: 1923/02/27   Cancelled Treatment:    Reason Eval/Treat Not Completed: Medical issues which prohibited therapy.  Pt has extremely low BP and cannot be seen today.  Will try again tomorrow.   Ramond Dial 04/04/2017, 6:08 PM  6:09 PM, 04/04/17 Mee Hives, PT, MS Physical Therapist - [place] [phone] (Office)

## 2017-04-04 NOTE — Progress Notes (Signed)
PROGRESS NOTE  COREAN YOSHIMURA KYH:[id number] DOB: 1923/04/01 DOA: 04/02/2017 PCP: [name], MD  Brief History:  83 year old female with a history of stress-induced cardiomyopathy with EF 35-40 percent, nonobstructive CAD, chronic atrial fibrillation, R-DCIS, iron deficiency anemia, hypertension, hyperlipidemia, TIA, ascending aortic aneurysm presenting with one-day history of shortness of breath, nonproductive cough, and orthopnea type symptoms. The patient saw cardiology on 04/01/2017 at which time, there was noted weight gain and lower extremity edema. The patient was instructed to increase her furosemide to 20 mg twice a day. The patient was discharged from the hospital after a stay from 03/16/2017 through 03/19/2017 during which time the patient underwent a cardiac catheterization revealing nonobstructive CAD. The patient was instructed to start furosemide 20 mg daily at the time of that discharge. Her daughter states that the patient did not start her furosemide until approximately one week prior to this admission. During her office visit on 04/01/2017, there was concern about dietary indiscretion. Chest x-ray at the time of admission revealed increasing pulmonary venous congestion. CT angiogram of the chest was negative for pulmonary embolus. After admission, the patient was started on intravenous furosemide with good clinical effect.  Assessment/Plan: Acute on chronic systolic CHF -15/17/6160 discharge weight 164 -04/02/2017 admission weight 170 -NEG 9 lbs -Continue intravenous furosemide 20 mg IV q 8 -accurate I/O's -fluid restrict -Echo--EF 35-40%, dyskinesis mid apical anteroseptal, moderate TR, PAS 4042 -continue IV Lasix -NEG 5.2L L for the admission, question intake accuracy -03/18/2017 heart catheterization--mild nonobstructive CAD -Continue carvedilol  Acute respiratory failure with hypoxia -Secondary to pulmonary edema -Stable on 2 L-->weaned to RA -CTA  chest neg for PE  Chronic atrial fibrillation -Rate controlled -Continue carvedilol -CHADSVASc = 8 -not on AC due to GIB and iron deficiency anemia -Continue Plavix  CKD stage 3 -Baseline creatinine 1.0-1.3 -Monitor with diuresis  Iron deficiency anemia -Repeat iron studies--results pending -Baseline hemoglobin approximately 12   Lower extremity edema/pain -superficial clot in right superficial femoral vein  Ascending aortic aneurysm -Measuring 4.5 cm on CT angiogram chest without dissection -poor surgical candidate -continue outpt surveillance  Chronic pain -continue tramadol    Disposition Plan:   Home 9/2 or 9/3 Family Communication:   Daughter update at bedside 9/1  Consultants:  none  Code Status:  FULL  DVT Prophylaxis:  [place] Heparin   Procedures: As Listed in Progress Note Above  Antibiotics: None    Subjective: Overall patient is breathing better. She denies any fevers, chills, chest pain, SOB, nausea, vomiting, diarrhea, abdominal pain, dysuria, hematuria.  Objective: Vitals:   04/03/17 1423 04/03/17 1823 04/03/17 2223 04/04/17 0528  BP: (!) 117/52 98/73 (!) 113/54 (!) 119/54  Pulse: 64 72 74 61  Resp: 18 20 20 18   Temp: 97.6 F (36.4 C) 97.8 F (36.6 C) 97.8 F (36.6 C) 97.9 F (36.6 C)  TempSrc: Oral Oral Oral Oral  SpO2: 100% 99% 95% 98%  Weight:    73 kg (161 lb)  Height:        Intake/Output Summary (Last 24 hours) at 04/04/17 1219 Last data filed at 04/04/17 0929  Gross per 24 hour  Intake              360 ml  Output             1950 ml  Net            -1590 ml   Weight change: -0.658 kg (-1 lb  7.2 oz) Exam:   General:  Pt is alert, follows commands appropriately, not in acute distress  HEENT: No icterus, No thrush, No neck mass, [place]/AT  Cardiovascular: RRR, S1/S2, no rubs, no gallops  Respiratory: bibasilar crackles  Abdomen: Soft/+BS, non tender, non distended, no guarding  Extremities: 1+LE edema, No  lymphangitis, No petechiae, No rashes, no synovitis   Data Reviewed: I have personally reviewed following labs and imaging studies Basic Metabolic Panel:  Recent Labs Lab 04/01/17 1410 04/02/17 1703 04/03/17 0558 04/04/17 0549  NA 139 139 141 141  K 4.1 4.3 3.6 3.5  CL 104 104 102 100*  CO2 28 29 32 31  GLUCOSE 122* 101* 89 93  BUN 19 20 19 19   CREATININE 1.30* 1.39* 1.31* 1.26*  CALCIUM 8.3* 8.2* 8.3* 8.2*  MG  --   --   --  2.1   Liver Function Tests:  Recent Labs Lab 04/02/17 1703  AST 17  ALT 8*  ALKPHOS 62  BILITOT 0.7  PROT 6.5  ALBUMIN 3.7   No results for input(s): LIPASE, AMYLASE in the last 168 hours. No results for input(s): AMMONIA in the last 168 hours. Coagulation Profile: No results for input(s): INR, PROTIME in the last 168 hours. CBC:  Recent Labs Lab 04/01/17 1410 04/02/17 1703  WBC 4.9 5.1  NEUTROABS 2.8 3.0  HGB 10.9* 11.3*  HCT 34.2* 35.3*  MCV 97.2 97.0  PLT 165 167   Cardiac Enzymes:  Recent Labs Lab 04/02/17 1703 04/02/17 2358 04/03/17 0558 04/04/17 1115  TROPONINI <0.03 <0.03 <0.03 <0.03   BNP: Invalid input(s): POCBNP CBG: No results for input(s): GLUCAP in the last 168 hours. HbA1C: No results for input(s): HGBA1C in the last 72 hours. Urine analysis:    Component Value Date/Time   COLORURINE COLORLESS (A) 04/02/2017 2222   APPEARANCEUR CLEAR 04/02/2017 2222   LABSPEC 1.009 04/02/2017 2222   PHURINE 5.0 04/02/2017 2222   GLUCOSEU NEGATIVE 04/02/2017 2222   HGBUR LARGE (A) 04/02/2017 2222   BILIRUBINUR NEGATIVE 04/02/2017 2222   KETONESUR NEGATIVE 04/02/2017 2222   PROTEINUR NEGATIVE 04/02/2017 2222   UROBILINOGEN 1.0 12/18/2014 1735   NITRITE NEGATIVE 04/02/2017 2222   LEUKOCYTESUR NEGATIVE 04/02/2017 2222   Sepsis Labs: @LABRCNTIP (procalcitonin:4,lacticidven:4) )No results found for this or any previous visit (from the past 240 hour(s)).   Scheduled Meds: . carvedilol  3.125 mg Oral BID WC  .  clopidogrel  75 mg Oral Q breakfast  . folic acid  1 mg Oral Daily  . furosemide  20 mg Intravenous Q8H  . gabapentin  200 mg Oral TID  . heparin  5,000 Units Subcutaneous Q8H  . pantoprazole  40 mg Oral BID  . potassium chloride SA  40 mEq Oral Daily  . prednisoLONE acetate  1 drop Both Eyes QID  . sodium chloride  1 drop Both Eyes TID   Continuous Infusions:  Procedures/Studies: Dg Chest 2 View  Result Date: 04/02/2017 CLINICAL DATA:  Shortness of breath.  Chest pain. EXAM: CHEST  2 VIEW COMPARISON:  March 16, 2017 FINDINGS: Stable cardiomegaly. The hila and mediastinum are unchanged. No nodules, masses, or focal infiltrates within the lungs. Probable tiny effusions or pleural thickening seen on the lateral view. Anterior wedging of an upper thoracic vertebral body is stable. IMPRESSION: No active cardiopulmonary disease. Electronically Signed   By: Dorise Bullion III M.D   On: 04/02/2017 17:52   Dg Chest 2 View  Result Date: 03/16/2017 CLINICAL DATA:  83 year old female with chest  pain and shortness of breath for 3 days. EXAM: CHEST  2 VIEW COMPARISON:  05/18/2016 FINDINGS: Cardiomediastinal silhouette is stable in size and configuration. Coarsened interstitial markings appear similar to prior examination. There is no focal airspace opacity, pleural effusion or pneumothorax. Chronic and degenerative osseous changes appear stable, including anterior wedge compression deformity of an upper vertebra. No acute osseous abnormality. IMPRESSION: 1. No active cardiopulmonary disease. 2. Stable cardiomegaly and coarsened interstitial markings. 3. Stable chronic and degenerative osseous changes. Electronically Signed   By: Kristopher Oppenheim M.D.   On: 03/16/2017 12:29   Ct Angio Chest Pe W And/or Wo Contrast  Result Date: 04/02/2017 CLINICAL DATA:  Chest pain or shortness of breath EXAM: CT ANGIOGRAPHY CHEST WITH CONTRAST TECHNIQUE: Multidetector CT imaging of the chest was performed using the  standard protocol during bolus administration of intravenous contrast. Multiplanar CT image reconstructions and MIPs were obtained to evaluate the vascular anatomy. CONTRAST:  65 mL Isovue 370 intravenous COMPARISON:  04/02/2017 FINDINGS: Cardiovascular: Aneurysmal dilatation of the ascending aorta up to 4.6 cm. Atherosclerotic calcifications. Coronary artery calcifications. Cardiomegaly. No pericardial effusion. Satisfactory opacification of the pulmonary arterial system. Negative for acute filling defect to suggest pulmonary embolus. Enlarged pulmonary trunk up to 4.5 cm. Mediastinum/Nodes: Prominent heterogenous thyroid. Midline trachea. 1 cm precarinal lymph node. Esophagus within normal limits Lungs/Pleura: Linear atelectasis or scarring at the lingula and right apex. Negative for effusion or consolidation. Negative for pneumothorax. Upper Abdomen: Reflux of contrast into the hepatic veins. Partially visualized cyst midpole left kidney. Musculoskeletal: Osteopenia.  Mild to moderate compression of T7. Review of the MIP images confirms the above findings. IMPRESSION: 1. Negative for acute pulmonary embolus. 2. Enlarged pulmonary arterial trunk consistent with pulmonary hypertension. 3. Aneurysmal dilatation of the ascending aorta up to 4.6 cm. Ascending thoracic aortic aneurysm. Recommend semi-annual imaging followup by CTA or MRA and referral to cardiothoracic surgery if not already obtained. This recommendation follows 2010 ACCF/AHA/AATS/ACR/ASA/SCA/SCAI/SIR/STS/SVM Guidelines for the Diagnosis and Management of Patients With Thoracic Aortic Disease. Circulation. 2010; 121: P824-M353 4. Clear lung fields aside from areas of pulmonary scarring 5. Mild to moderate compression of T7, no change from recent CT Aortic aneurysm NOS (ICD10-I71.9). Aortic Atherosclerosis (ICD10-I70.0). Electronically Signed   By: Donavan Foil M.D.   On: 04/02/2017 19:11   US Venous Img Lower Bilateral  Result Date:  04/03/2017 CLINICAL DATA:  Bilateral leg pain. EXAM: BILATERAL LOWER EXTREMITY VENOUS DOPPLER ULTRASOUND TECHNIQUE: Gray-scale sonography with graded compression, as well as color Doppler and duplex ultrasound were performed to evaluate the lower extremity deep venous systems from the level of the common femoral vein and including the common femoral, femoral, profunda femoral, popliteal and calf veins including the posterior tibial, peroneal and gastrocnemius veins when visible. The superficial great saphenous vein was also interrogated. Spectral Doppler was utilized to evaluate flow at rest and with distal augmentation maneuvers in the common femoral, femoral and popliteal veins. COMPARISON:  None. FINDINGS: RIGHT LOWER EXTREMITY Common Femoral Vein: No evidence of thrombus. Normal compressibility, respiratory phasicity and response to augmentation. Saphenofemoral Junction: No evidence of thrombus. Normal compressibility and flow on color Doppler imaging. Profunda Femoral Vein: No evidence of thrombus. Normal compressibility and flow on color Doppler imaging. Femoral Vein: No evidence of thrombus. Normal compressibility, respiratory phasicity and response to augmentation. Popliteal Vein: No evidence of thrombus. Normal compressibility, respiratory phasicity and response to augmentation. Calf Veins: No evidence of thrombus. Normal compressibility and flow on color Doppler imaging. Superficial Great Saphenous Vein: Occlusive thrombus  from the thigh to ankle. Venous Reflux:  None. Other Findings:  None. LEFT LOWER EXTREMITY Common Femoral Vein: No evidence of thrombus. Normal compressibility, respiratory phasicity and response to augmentation. Saphenofemoral Junction: No evidence of thrombus. Normal compressibility and flow on color Doppler imaging. Profunda Femoral Vein: No evidence of thrombus. Normal compressibility and flow on color Doppler imaging. Femoral Vein: No evidence of thrombus. Normal compressibility,  respiratory phasicity and response to augmentation. Popliteal Vein: No evidence of thrombus. Normal compressibility, respiratory phasicity and response to augmentation. Calf Veins: No evidence of thrombus. Normal compressibility and flow on color Doppler imaging. Superficial Great Saphenous Vein: No evidence of thrombus. Normal compressibility and flow on color Doppler imaging. Venous Reflux:  None. Other Findings:  None. IMPRESSION: 1. No evidence of DVT within either lower extremity. 2. Occlusive superficial venous thrombosis of the right superficial great saphenous vein from the thigh to ankle. Electronically Signed   By: Titus Dubin M.D.   On: 04/03/2017 12:43   Ct Angio Chest/abd/pel For Dissection W And/or Wo Contrast  Result Date: 03/16/2017 CLINICAL DATA:  Chest pain radiating into back with some associated shortness of breath. EXAM: CT ANGIOGRAPHY CHEST, ABDOMEN AND PELVIS TECHNIQUE: Multidetector CT imaging through the chest, abdomen and pelvis was performed using the standard protocol during bolus administration of intravenous contrast. Multiplanar reconstructed images and MIPs were obtained and reviewed to evaluate the vascular anatomy. CONTRAST:  80 mL Isovue 370 IV COMPARISON:  CT of the abdomen and pelvis on 11/06/2015 FINDINGS: CTA CHEST FINDINGS Cardiovascular: There is stable dilatation of the ascending thoracic aorta which measures 4.6 cm in greatest diameter. The aorta measures approximately 4.3 cm at the level of the sinuses of Valsalva. The proximal arch measures 3.6 cm. The distal arch measures 3.0 cm. The descending thoracic aorta measures 2.6 cm. No evidence of aortic dissection or intramural hemorrhage. No evidence of penetrating ulcer disease. The proximal great vessels show tortuosity without significant stenosis or aneurysmal disease. The heart is significantly enlarged. Calcified plaque is noted in the coronary tree in a 3 vessel distribution. There are some mild calcifications  at the level of the aortic valve and mitral annulus. No pericardial fluid identified. Central pulmonary arteries are very prominent in caliber with the main pulmonary artery measuring approximately 4.3 cm in diameter. Findings are consistent with underlying pulmonary hypertension. Mediastinum/Nodes: No evidence of mediastinal, hilar or axillary lymphadenopathy. No mediastinal masses identified. No gross abnormalities involving the trachea or esophagus. Lungs/Pleura: Lungs demonstrate bilateral pulmonary scarring. No evidence of focal airspace consolidation, pulmonary edema or pleural effusions. No pulmonary masses or pneumothorax. Musculoskeletal: Bony structures are unremarkable. Review of the MIP images confirms the above findings. CTA ABDOMEN AND PELVIS FINDINGS VASCULAR Aorta: The abdominal aorta shows scattered plaque without evidence of aneurysm or dissection. Celiac: Origin stenosis of approximately 50%. SMA: Normally patent. Renals: Mild plaque at the origins of bilateral single renal arteries without evidence of stenosis. IMA: Normally patent. Inflow: Bilateral iliac arteries show moderate tortuosity and mild plaque without evidence of stenosis or aneurysmal disease. The common femoral arteries and femoral bifurcations are normally patent bilaterally. Review of the MIP images confirms the above findings. NON-VASCULAR Hepatobiliary: No focal liver abnormality is seen. Status post cholecystectomy. No biliary dilatation. Pancreas: Unremarkable. No pancreatic ductal dilatation or surrounding inflammatory changes. Spleen: Normal in size without focal abnormality. Adrenals/Urinary Tract: Small lateral interpolar cortical mass visualized on the arterial phase and measures approximately 1.2 cm. This shows no significant growth since the prior study in 2017.  Stable cyst of the contralateral left kidney. Adrenal glands appear unremarkable. The bladder is decompressed. Stomach/Bowel: Bowel shows no evidence of  obstruction or inflammation. Diverticulosis of the sigmoid colon noted without evidence of acute diverticulitis. Lymphatic: No enlarged lymph nodes identified. Reproductive: Status post hysterectomy. No adnexal masses. Other: No abdominal wall hernia or abnormality. No abdominopelvic ascites. Musculoskeletal: Bones are osteopenic. No fractures or lesions identified. Review of the MIP images confirms the above findings. IMPRESSION: 1. Stable aneurysmal disease of the ascending thoracic aorta which measures 4.6 cm in greatest diameter. There is no evidence of acute aortic dissection or other acute aortic syndrome. 2. Coronary atherosclerosis with calcified plaque in a 3 vessel distribution. 3. Significant cardiac enlargement without pulmonary edema. 4. Evidence of pulmonary hypertension with significant dilatation of the main pulmonary artery and central pulmonary arteries. 5. Stable roughly 1.2 cm right renal mass centered in the lateral interpolar cortex. This shows no significant growth since a prior CT on 11/06/2015. Electronically Signed   By: Aletta Edouard M.D.   On: 03/16/2017 14:25    Gigi Onstad, DO  [facility] Pager [phone]  If 7PM-7AM, please contact night-coverage [email or web address] Password [email or web address] 04/04/2017, 12:19 PM   LOS: 0 days

## 2017-04-05 DIAGNOSIS — E876 Hypokalemia: Secondary | ICD-10-CM

## 2017-04-05 LAB — BASIC METABOLIC PANEL
Anion gap: 10 (ref 5–15)
BUN: 25 mg/dL — AB (ref 6–20)
CALCIUM: 8.3 mg/dL — AB (ref 8.9–10.3)
CO2: 31 mmol/L (ref 22–32)
CREATININE: 1.42 mg/dL — AB (ref 0.44–1.00)
Chloride: 99 mmol/L — ABNORMAL LOW (ref 101–111)
GFR calc non Af Amer: 31 mL/min — ABNORMAL LOW (ref >60)
GFR, EST AFRICAN AMERICAN: 36 mL/min — AB (ref >60)
Glucose, Bld: 93 mg/dL (ref 65–99)
Potassium: 3.8 mmol/L (ref 3.5–5.1)
Sodium: 140 mmol/L (ref 135–145)

## 2017-04-05 LAB — MAGNESIUM: Magnesium: 2 mg/dL (ref 1.7–2.4)

## 2017-04-05 MED ORDER — FUROSEMIDE 20 MG PO TABS: 20.0000 mg | ORAL_TABLET | Freq: Two times a day (BID) | ORAL | Status: DC

## 2017-04-05 MED ORDER — NA FERRIC GLUC CPLX IN SUCROSE 12.5 MG/ML IV SOLN
125.0000 mg | Freq: Once | INTRAVENOUS | Status: AC
  Administered 2017-04-05: 125 mg via INTRAVENOUS
  Filled 2017-04-05: qty 10

## 2017-04-05 MED ORDER — FUROSEMIDE 20 MG PO TABS: 20.0000 mg | ORAL_TABLET | Freq: Two times a day (BID) | ORAL | 0 refills | Status: DC

## 2017-04-05 NOTE — Discharge Summary (Signed)
Physician Discharge Summary  [name] [name] WLS:[id number] DOB: July 31, 1923 DOA: 04/02/2017  PCP: [name], MD  Admit date: 04/02/2017 Discharge date: 04/05/2017  Admitted From: Home Disposition:  Home  Recommendations for Outpatient Follow-up:  1. Follow up with PCP in 1-2 weeks 2. Please obtain BMP/CBC in one week   Home Health: YES Equipment/Devices: PT, RN  Discharge Condition: Stable CODE STATUS: DNR Diet recommendation: Heart Healthy   Brief/Interim Summary: 83 year old female with a history of stress-induced cardiomyopathy with EF 35-40 percent, nonobstructive CAD, chronic atrial fibrillation, R-DCIS, iron deficiency anemia, hypertension, hyperlipidemia, TIA, ascending aortic aneurysm presenting with one-day history of shortness of breath, nonproductive cough, and orthopnea type symptoms. The patient saw cardiology on 04/01/2017 at which time, there was noted weight gain and lower extremity edema. The patient was instructed to increase her furosemide to 20 mg twice a day. The patient was discharged from the hospital after a stay from 03/16/2017 through 03/19/2017 during which time the patient underwent a cardiac catheterization revealing nonobstructive CAD. The patient was instructed to start furosemide 20 mg daily at the time of that discharge. Her daughter states that the patient did not start her furosemide until approximately one week prior to this admission. During her office visit on 04/01/2017, there was concern about dietary indiscretion. Chest x-ray at the time of admission revealed increasing pulmonary venous congestion. CT angiogram of the chest was negative for pulmonary embolus. After admission, the patient was started on intravenous furosemide with good clinical effect.  Discharge Diagnoses:  Acute on chronic systolic CHF -81/15/7262 discharge weight 164 -04/02/2017 admission weight 170 -NEG 11 lbs--discharge weight 159 -Continue intravenous furosemide 20 mg  IV q 8 -accurate I/O's -fluid restrict -Echo--EF 35-40%, dyskinesis mid apical anteroseptal, moderate TR, PAS 4042 -continue IV Lasix-->po lasix 20 mg bid -NEG 6.2LL for the admission, question intake accuracy -03/18/2017 heart catheterization--mild nonobstructive CAD -Continue carvedilol  Acute respiratory failure with hypoxia -Secondary to pulmonary edema -Stable on 2 L-->weaned to RA -CTA chest neg for PE  Chronic atrial fibrillation -Rate controlled -Continue carvedilol -CHADSVASc = 8 -not on AC due to GIB and iron deficiency anemia -Continue Plavix  CKD stage 3 -Baseline creatinine 1.0-1.3 -Monitor with diuresis  Iron deficiency anemia -Repeat iron studies--iron saturation 14%, ferritin 87 -Baseline hemoglobin approximately 12  -nulecit x 1 given  Lower extremity edema/pain -superficial clot in right superficial femoral vein  Ascending aortic aneurysm -Measuring 4.5 cm on CT angiogram chest without dissection -poor surgical candidate -continue outpt surveillance  Chronic pain -continue tramadol    Discharge Instructions  Discharge Instructions    Diet - low sodium heart healthy    Complete by:  As directed    Increase activity slowly    Complete by:  As directed      Allergies as of 04/05/2017      Reactions   Codeine Diarrhea, Nausea Only   Tape Other (See Comments)   SKIN IS VERY THIN AND TEARS AND BRUISES EASILY; PLEASE USE COBAN WRAP!!   Asa [aspirin] Rash, Other (See Comments)   Petechia   Iron Nausea And Vomiting   Oral iron causes nausea and vomiting   Penicillins Rash   Has patient had a PCN reaction causing immediate rash, facial/tongue/throat swelling, SOB or lightheadedness with hypotension: Yes Has patient had a PCN reaction causing severe rash involving mucus membranes or skin necrosis: Unknown Has patient had a PCN reaction that required hospitalization: No Has patient had a PCN reaction occurring within the last 10 years:  No If all of the above answers are "NO", then may proceed with Cephalosporin use.      Medication List    TAKE these medications   carvedilol 3.125 MG tablet Commonly known as:  COREG Take 1 tablet (3.125 mg total) by mouth 2 (two) times daily with a meal.   clopidogrel 75 MG tablet Commonly known as:  PLAVIX Take 75 mg by mouth daily with breakfast.   cyanocobalamin 2000 MCG tablet Commonly known as:  [employer] VITAMIN B12 Take 1 tablet (2,000 mcg total) by mouth daily.   erythromycin ophthalmic ointment 1 application as needed (for eye infection).   folic acid 1 MG tablet Commonly known as:  FOLVITE Take 1 tablet (1 mg total) by mouth daily.   furosemide 20 MG tablet Commonly known as:  LASIX Take 1 tablet (20 mg total) by mouth 2 (two) times daily.   gabapentin 100 MG capsule Commonly known as:  NEURONTIN TAKE 2 CAPSULES BY MOUTH 3  TIMES DAILY What changed:  See the new instructions.   hydrocortisone 25 MG suppository Commonly known as:  ANUSOL-HC Place 1 suppository (25 mg total) rectally every 12 (twelve) hours. What changed:  when to take this   ondansetron 4 MG tablet Commonly known as:  ZOFRAN Take 1 tablet (4 mg total) by mouth every 8 (eight) hours as needed for nausea or vomiting. What changed:  when to take this   pantoprazole 40 MG tablet Commonly known as:  PROTONIX Take 1 tablet (40 mg total) by mouth 2 (two) times daily.   potassium chloride SA 20 MEQ tablet Commonly known as:  K-DUR,KLOR-CON Take 2 tablets (40 mEq total) by mouth daily as needed (when taking lasix).   prednisoLONE acetate 1 % ophthalmic suspension Commonly known as:  PRED FORTE Place 1 drop into both eyes 4 (four) times daily.   PROAIR HFA 108 (90 Base) MCG/ACT inhaler Generic drug:  albuterol Inhale 1-2 puffs into the lungs every 6 (six) hours as needed for wheezing or shortness of breath.   psyllium 58.6 % packet Commonly known as:  METAMUCIL Take 1 packet by mouth daily as  needed (for constipation).   sodium chloride 2 % ophthalmic solution Commonly known as:  MURO 128 Place 1 drop into both eyes 3 (three) times daily.   traMADol-acetaminophen 37.5-325 MG tablet Commonly known as:  ULTRACET Take 1 tablet by mouth every 6 (six) hours as needed for moderate pain or severe pain.            Discharge Care Instructions        Start     Ordered   04/05/17 0000  furosemide (LASIX) 20 MG tablet  2 times daily     04/05/17 1138   04/05/17 0000  Increase activity slowly     04/05/17 1138   04/05/17 0000  Diet - low sodium heart healthy     04/05/17 1138     Follow-up Information    [facility] Follow [facility].   Contact information: [place] [address] [phone]          Allergies  Allergen Reactions  . Codeine Diarrhea and Nausea Only  . Tape Other (See Comments)    SKIN IS VERY THIN AND TEARS AND BRUISES EASILY; PLEASE USE COBAN WRAP!!  . Diona Fanti [Aspirin] Rash and Other (See Comments)    Petechia   . Iron Nausea And Vomiting    Oral iron causes nausea and vomiting  .  Penicillins Rash    Has patient had a PCN reaction causing immediate rash, facial/tongue/throat swelling, SOB or lightheadedness with hypotension: Yes Has patient had a PCN reaction causing severe rash involving mucus membranes or skin necrosis: Unknown Has patient had a PCN reaction that required hospitalization: No Has patient had a PCN reaction occurring within the last 10 years: No If all of the above answers are "NO", then may proceed with Cephalosporin use.     Consultations:  none   Procedures/Studies: Dg Chest 2 View  Result Date: 04/02/2017 CLINICAL DATA:  Shortness of breath.  Chest pain. EXAM: CHEST  2 VIEW COMPARISON:  March 16, 2017 FINDINGS: Stable cardiomegaly. The hila and mediastinum are unchanged. No nodules, masses, or focal infiltrates within the lungs. Probable tiny effusions or pleural thickening seen on  the lateral view. Anterior wedging of an upper thoracic vertebral body is stable. IMPRESSION: No active cardiopulmonary disease. Electronically Signed   By: Dorise Bullion III M.D   On: 04/02/2017 17:52   Dg Chest 2 View  Result Date: 03/16/2017 CLINICAL DATA:  83 year old female with chest pain and shortness of breath for 3 days. EXAM: CHEST  2 VIEW COMPARISON:  05/18/2016 FINDINGS: Cardiomediastinal silhouette is stable in size and configuration. Coarsened interstitial markings appear similar to prior examination. There is no focal airspace opacity, pleural effusion or pneumothorax. Chronic and degenerative osseous changes appear stable, including anterior wedge compression deformity of an upper vertebra. No acute osseous abnormality. IMPRESSION: 1. No active cardiopulmonary disease. 2. Stable cardiomegaly and coarsened interstitial markings. 3. Stable chronic and degenerative osseous changes. Electronically Signed   By: Kristopher Oppenheim M.D.   On: 03/16/2017 12:29   Ct Angio Chest Pe W And/or Wo Contrast  Result Date: 04/02/2017 CLINICAL DATA:  Chest pain or shortness of breath EXAM: CT ANGIOGRAPHY CHEST WITH CONTRAST TECHNIQUE: Multidetector CT imaging of the chest was performed using the standard protocol during bolus administration of intravenous contrast. Multiplanar CT image reconstructions and MIPs were obtained to evaluate the vascular anatomy. CONTRAST:  65 mL Isovue 370 intravenous COMPARISON:  04/02/2017 FINDINGS: Cardiovascular: Aneurysmal dilatation of the ascending aorta up to 4.6 cm. Atherosclerotic calcifications. Coronary artery calcifications. Cardiomegaly. No pericardial effusion. Satisfactory opacification of the pulmonary arterial system. Negative for acute filling defect to suggest pulmonary embolus. Enlarged pulmonary trunk up to 4.5 cm. Mediastinum/Nodes: Prominent heterogenous thyroid. Midline trachea. 1 cm precarinal lymph node. Esophagus within normal limits Lungs/Pleura: Linear  atelectasis or scarring at the lingula and right apex. Negative for effusion or consolidation. Negative for pneumothorax. Upper Abdomen: Reflux of contrast into the hepatic veins. Partially visualized cyst midpole left kidney. Musculoskeletal: Osteopenia.  Mild to moderate compression of T7. Review of the MIP images confirms the above findings. IMPRESSION: 1. Negative for acute pulmonary embolus. 2. Enlarged pulmonary arterial trunk consistent with pulmonary hypertension. 3. Aneurysmal dilatation of the ascending aorta up to 4.6 cm. Ascending thoracic aortic aneurysm. Recommend semi-annual imaging followup by CTA or MRA and referral to cardiothoracic surgery if not already obtained. This recommendation follows 2010 ACCF/AHA/AATS/ACR/ASA/SCA/SCAI/SIR/STS/SVM Guidelines for the Diagnosis and Management of Patients With Thoracic Aortic Disease. Circulation. 2010; 121: H299-M426 4. Clear lung fields aside from areas of pulmonary scarring 5. Mild to moderate compression of T7, no change from recent CT Aortic aneurysm NOS (ICD10-I71.9). Aortic Atherosclerosis (ICD10-I70.0). Electronically Signed   By: Donavan Foil M.D.   On: 04/02/2017 19:11   US Venous Img Lower Bilateral  Result Date: 04/03/2017 CLINICAL DATA:  Bilateral leg pain. EXAM: BILATERAL  LOWER EXTREMITY VENOUS DOPPLER ULTRASOUND TECHNIQUE: Gray-scale sonography with graded compression, as well as color Doppler and duplex ultrasound were performed to evaluate the lower extremity deep venous systems from the level of the common femoral vein and including the common femoral, femoral, profunda femoral, popliteal and calf veins including the posterior tibial, peroneal and gastrocnemius veins when visible. The superficial great saphenous vein was also interrogated. Spectral Doppler was utilized to evaluate flow at rest and with distal augmentation maneuvers in the common femoral, femoral and popliteal veins. COMPARISON:  None. FINDINGS: RIGHT LOWER EXTREMITY  Common Femoral Vein: No evidence of thrombus. Normal compressibility, respiratory phasicity and response to augmentation. Saphenofemoral Junction: No evidence of thrombus. Normal compressibility and flow on color Doppler imaging. Profunda Femoral Vein: No evidence of thrombus. Normal compressibility and flow on color Doppler imaging. Femoral Vein: No evidence of thrombus. Normal compressibility, respiratory phasicity and response to augmentation. Popliteal Vein: No evidence of thrombus. Normal compressibility, respiratory phasicity and response to augmentation. Calf Veins: No evidence of thrombus. Normal compressibility and flow on color Doppler imaging. Superficial Great Saphenous Vein: Occlusive thrombus from the thigh to ankle. Venous Reflux:  None. Other Findings:  None. LEFT LOWER EXTREMITY Common Femoral Vein: No evidence of thrombus. Normal compressibility, respiratory phasicity and response to augmentation. Saphenofemoral Junction: No evidence of thrombus. Normal compressibility and flow on color Doppler imaging. Profunda Femoral Vein: No evidence of thrombus. Normal compressibility and flow on color Doppler imaging. Femoral Vein: No evidence of thrombus. Normal compressibility, respiratory phasicity and response to augmentation. Popliteal Vein: No evidence of thrombus. Normal compressibility, respiratory phasicity and response to augmentation. Calf Veins: No evidence of thrombus. Normal compressibility and flow on color Doppler imaging. Superficial Great Saphenous Vein: No evidence of thrombus. Normal compressibility and flow on color Doppler imaging. Venous Reflux:  None. Other Findings:  None. IMPRESSION: 1. No evidence of DVT within either lower extremity. 2. Occlusive superficial venous thrombosis of the right superficial great saphenous vein from the thigh to ankle. Electronically Signed   By: Titus Dubin M.D.   On: 04/03/2017 12:43   Ct Angio Chest/abd/pel For Dissection W And/or Wo  Contrast  Result Date: 03/16/2017 CLINICAL DATA:  Chest pain radiating into back with some associated shortness of breath. EXAM: CT ANGIOGRAPHY CHEST, ABDOMEN AND PELVIS TECHNIQUE: Multidetector CT imaging through the chest, abdomen and pelvis was performed using the standard protocol during bolus administration of intravenous contrast. Multiplanar reconstructed images and MIPs were obtained and reviewed to evaluate the vascular anatomy. CONTRAST:  80 mL Isovue 370 IV COMPARISON:  CT of the abdomen and pelvis on 11/06/2015 FINDINGS: CTA CHEST FINDINGS Cardiovascular: There is stable dilatation of the ascending thoracic aorta which measures 4.6 cm in greatest diameter. The aorta measures approximately 4.3 cm at the level of the sinuses of Valsalva. The proximal arch measures 3.6 cm. The distal arch measures 3.0 cm. The descending thoracic aorta measures 2.6 cm. No evidence of aortic dissection or intramural hemorrhage. No evidence of penetrating ulcer disease. The proximal great vessels show tortuosity without significant stenosis or aneurysmal disease. The heart is significantly enlarged. Calcified plaque is noted in the coronary tree in a 3 vessel distribution. There are some mild calcifications at the level of the aortic valve and mitral annulus. No pericardial fluid identified. Central pulmonary arteries are very prominent in caliber with the main pulmonary artery measuring approximately 4.3 cm in diameter. Findings are consistent with underlying pulmonary hypertension. Mediastinum/Nodes: No evidence of mediastinal, hilar or axillary lymphadenopathy. No mediastinal  masses identified. No gross abnormalities involving the trachea or esophagus. Lungs/Pleura: Lungs demonstrate bilateral pulmonary scarring. No evidence of focal airspace consolidation, pulmonary edema or pleural effusions. No pulmonary masses or pneumothorax. Musculoskeletal: Bony structures are unremarkable. Review of the MIP images confirms the  above findings. CTA ABDOMEN AND PELVIS FINDINGS VASCULAR Aorta: The abdominal aorta shows scattered plaque without evidence of aneurysm or dissection. Celiac: Origin stenosis of approximately 50%. SMA: Normally patent. Renals: Mild plaque at the origins of bilateral single renal arteries without evidence of stenosis. IMA: Normally patent. Inflow: Bilateral iliac arteries show moderate tortuosity and mild plaque without evidence of stenosis or aneurysmal disease. The common femoral arteries and femoral bifurcations are normally patent bilaterally. Review of the MIP images confirms the above findings. NON-VASCULAR Hepatobiliary: No focal liver abnormality is seen. Status post cholecystectomy. No biliary dilatation. Pancreas: Unremarkable. No pancreatic ductal dilatation or surrounding inflammatory changes. Spleen: Normal in size without focal abnormality. Adrenals/Urinary Tract: Small lateral interpolar cortical mass visualized on the arterial phase and measures approximately 1.2 cm. This shows no significant growth since the prior study in 2017. Stable cyst of the contralateral left kidney. Adrenal glands appear unremarkable. The bladder is decompressed. Stomach/Bowel: Bowel shows no evidence of obstruction or inflammation. Diverticulosis of the sigmoid colon noted without evidence of acute diverticulitis. Lymphatic: No enlarged lymph nodes identified. Reproductive: Status post hysterectomy. No adnexal masses. Other: No abdominal wall hernia or abnormality. No abdominopelvic ascites. Musculoskeletal: Bones are osteopenic. No fractures or lesions identified. Review of the MIP images confirms the above findings. IMPRESSION: 1. Stable aneurysmal disease of the ascending thoracic aorta which measures 4.6 cm in greatest diameter. There is no evidence of acute aortic dissection or other acute aortic syndrome. 2. Coronary atherosclerosis with calcified plaque in a 3 vessel distribution. 3. Significant cardiac enlargement  without pulmonary edema. 4. Evidence of pulmonary hypertension with significant dilatation of the main pulmonary artery and central pulmonary arteries. 5. Stable roughly 1.2 cm right renal mass centered in the lateral interpolar cortex. This shows no significant growth since a prior CT on 11/06/2015. Electronically Signed   By: Aletta Edouard M.D.   On: 03/16/2017 14:25         Discharge Exam: Vitals:   04/04/17 2100 04/05/17 0536  BP: (!) 99/50 (!) 120/53  Pulse: 65 68  Resp: 18 18  Temp: 98.4 F (36.9 C) 98 F (36.7 C)  SpO2: 91% 90%   Vitals:   04/04/17 1307 04/04/17 1407 04/04/17 2100 04/05/17 0536  BP: (!) 99/36 (!) 96/35 (!) 99/50 (!) 120/53  Pulse:  71 65 68  Resp:  18 18 18   Temp:  97.8 F (36.6 C) 98.4 F (36.9 C) 98 F (36.7 C)  TempSrc:  Oral Oral Oral  SpO2:  90% 91% 90%  Weight:    72.3 kg (159 lb 6.3 oz)  Height:        General: Pt is alert, awake, not in acute distress Cardiovascular: RRR, S1/S2 +, no rubs, no gallops Respiratory: fine bibasilar crackles Abdominal: Soft, NT, ND, bowel sounds + Extremities: trace LE edema, no cyanosis   The results of significant diagnostics from this hospitalization (including imaging, microbiology, ancillary and laboratory) are listed below for reference.    Significant Diagnostic Studies: Dg Chest 2 View  Result Date: 04/02/2017 CLINICAL DATA:  Shortness of breath.  Chest pain. EXAM: CHEST  2 VIEW COMPARISON:  March 16, 2017 FINDINGS: Stable cardiomegaly. The hila and mediastinum are unchanged. No nodules, masses, or focal infiltrates  within the lungs. Probable tiny effusions or pleural thickening seen on the lateral view. Anterior wedging of an upper thoracic vertebral body is stable. IMPRESSION: No active cardiopulmonary disease. Electronically Signed   By: Dorise Bullion III M.D   On: 04/02/2017 17:52   Dg Chest 2 View  Result Date: 03/16/2017 CLINICAL DATA:  83 year old female with chest pain and shortness of  breath for 3 days. EXAM: CHEST  2 VIEW COMPARISON:  05/18/2016 FINDINGS: Cardiomediastinal silhouette is stable in size and configuration. Coarsened interstitial markings appear similar to prior examination. There is no focal airspace opacity, pleural effusion or pneumothorax. Chronic and degenerative osseous changes appear stable, including anterior wedge compression deformity of an upper vertebra. No acute osseous abnormality. IMPRESSION: 1. No active cardiopulmonary disease. 2. Stable cardiomegaly and coarsened interstitial markings. 3. Stable chronic and degenerative osseous changes. Electronically Signed   By: Kristopher Oppenheim M.D.   On: 03/16/2017 12:29   Ct Angio Chest Pe W And/or Wo Contrast  Result Date: 04/02/2017 CLINICAL DATA:  Chest pain or shortness of breath EXAM: CT ANGIOGRAPHY CHEST WITH CONTRAST TECHNIQUE: Multidetector CT imaging of the chest was performed using the standard protocol during bolus administration of intravenous contrast. Multiplanar CT image reconstructions and MIPs were obtained to evaluate the vascular anatomy. CONTRAST:  65 mL Isovue 370 intravenous COMPARISON:  04/02/2017 FINDINGS: Cardiovascular: Aneurysmal dilatation of the ascending aorta up to 4.6 cm. Atherosclerotic calcifications. Coronary artery calcifications. Cardiomegaly. No pericardial effusion. Satisfactory opacification of the pulmonary arterial system. Negative for acute filling defect to suggest pulmonary embolus. Enlarged pulmonary trunk up to 4.5 cm. Mediastinum/Nodes: Prominent heterogenous thyroid. Midline trachea. 1 cm precarinal lymph node. Esophagus within normal limits Lungs/Pleura: Linear atelectasis or scarring at the lingula and right apex. Negative for effusion or consolidation. Negative for pneumothorax. Upper Abdomen: Reflux of contrast into the hepatic veins. Partially visualized cyst midpole left kidney. Musculoskeletal: Osteopenia.  Mild to moderate compression of T7. Review of the MIP images  confirms the above findings. IMPRESSION: 1. Negative for acute pulmonary embolus. 2. Enlarged pulmonary arterial trunk consistent with pulmonary hypertension. 3. Aneurysmal dilatation of the ascending aorta up to 4.6 cm. Ascending thoracic aortic aneurysm. Recommend semi-annual imaging followup by CTA or MRA and referral to cardiothoracic surgery if not already obtained. This recommendation follows 2010 ACCF/AHA/AATS/ACR/ASA/SCA/SCAI/SIR/STS/SVM Guidelines for the Diagnosis and Management of Patients With Thoracic Aortic Disease. Circulation. 2010; 121: Z329-J242 4. Clear lung fields aside from areas of pulmonary scarring 5. Mild to moderate compression of T7, no change from recent CT Aortic aneurysm NOS (ICD10-I71.9). Aortic Atherosclerosis (ICD10-I70.0). Electronically Signed   By: Donavan Foil M.D.   On: 04/02/2017 19:11   US Venous Img Lower Bilateral  Result Date: 04/03/2017 CLINICAL DATA:  Bilateral leg pain. EXAM: BILATERAL LOWER EXTREMITY VENOUS DOPPLER ULTRASOUND TECHNIQUE: Gray-scale sonography with graded compression, as well as color Doppler and duplex ultrasound were performed to evaluate the lower extremity deep venous systems from the level of the common femoral vein and including the common femoral, femoral, profunda femoral, popliteal and calf veins including the posterior tibial, peroneal and gastrocnemius veins when visible. The superficial great saphenous vein was also interrogated. Spectral Doppler was utilized to evaluate flow at rest and with distal augmentation maneuvers in the common femoral, femoral and popliteal veins. COMPARISON:  None. FINDINGS: RIGHT LOWER EXTREMITY Common Femoral Vein: No evidence of thrombus. Normal compressibility, respiratory phasicity and response to augmentation. Saphenofemoral Junction: No evidence of thrombus. Normal compressibility and flow on color Doppler imaging. Profunda Femoral  Vein: No evidence of thrombus. Normal compressibility and flow on color  Doppler imaging. Femoral Vein: No evidence of thrombus. Normal compressibility, respiratory phasicity and response to augmentation. Popliteal Vein: No evidence of thrombus. Normal compressibility, respiratory phasicity and response to augmentation. Calf Veins: No evidence of thrombus. Normal compressibility and flow on color Doppler imaging. Superficial Great Saphenous Vein: Occlusive thrombus from the thigh to ankle. Venous Reflux:  None. Other Findings:  None. LEFT LOWER EXTREMITY Common Femoral Vein: No evidence of thrombus. Normal compressibility, respiratory phasicity and response to augmentation. Saphenofemoral Junction: No evidence of thrombus. Normal compressibility and flow on color Doppler imaging. Profunda Femoral Vein: No evidence of thrombus. Normal compressibility and flow on color Doppler imaging. Femoral Vein: No evidence of thrombus. Normal compressibility, respiratory phasicity and response to augmentation. Popliteal Vein: No evidence of thrombus. Normal compressibility, respiratory phasicity and response to augmentation. Calf Veins: No evidence of thrombus. Normal compressibility and flow on color Doppler imaging. Superficial Great Saphenous Vein: No evidence of thrombus. Normal compressibility and flow on color Doppler imaging. Venous Reflux:  None. Other Findings:  None. IMPRESSION: 1. No evidence of DVT within either lower extremity. 2. Occlusive superficial venous thrombosis of the right superficial great saphenous vein from the thigh to ankle. Electronically Signed   By: Titus Dubin M.D.   On: 04/03/2017 12:43   Ct Angio Chest/abd/pel For Dissection W And/or Wo Contrast  Result Date: 03/16/2017 CLINICAL DATA:  Chest pain radiating into back with some associated shortness of breath. EXAM: CT ANGIOGRAPHY CHEST, ABDOMEN AND PELVIS TECHNIQUE: Multidetector CT imaging through the chest, abdomen and pelvis was performed using the standard protocol during bolus administration of intravenous  contrast. Multiplanar reconstructed images and MIPs were obtained and reviewed to evaluate the vascular anatomy. CONTRAST:  80 mL Isovue 370 IV COMPARISON:  CT of the abdomen and pelvis on 11/06/2015 FINDINGS: CTA CHEST FINDINGS Cardiovascular: There is stable dilatation of the ascending thoracic aorta which measures 4.6 cm in greatest diameter. The aorta measures approximately 4.3 cm at the level of the sinuses of Valsalva. The proximal arch measures 3.6 cm. The distal arch measures 3.0 cm. The descending thoracic aorta measures 2.6 cm. No evidence of aortic dissection or intramural hemorrhage. No evidence of penetrating ulcer disease. The proximal great vessels show tortuosity without significant stenosis or aneurysmal disease. The heart is significantly enlarged. Calcified plaque is noted in the coronary tree in a 3 vessel distribution. There are some mild calcifications at the level of the aortic valve and mitral annulus. No pericardial fluid identified. Central pulmonary arteries are very prominent in caliber with the main pulmonary artery measuring approximately 4.3 cm in diameter. Findings are consistent with underlying pulmonary hypertension. Mediastinum/Nodes: No evidence of mediastinal, hilar or axillary lymphadenopathy. No mediastinal masses identified. No gross abnormalities involving the trachea or esophagus. Lungs/Pleura: Lungs demonstrate bilateral pulmonary scarring. No evidence of focal airspace consolidation, pulmonary edema or pleural effusions. No pulmonary masses or pneumothorax. Musculoskeletal: Bony structures are unremarkable. Review of the MIP images confirms the above findings. CTA ABDOMEN AND PELVIS FINDINGS VASCULAR Aorta: The abdominal aorta shows scattered plaque without evidence of aneurysm or dissection. Celiac: Origin stenosis of approximately 50%. SMA: Normally patent. Renals: Mild plaque at the origins of bilateral single renal arteries without evidence of stenosis. IMA: Normally  patent. Inflow: Bilateral iliac arteries show moderate tortuosity and mild plaque without evidence of stenosis or aneurysmal disease. The common femoral arteries and femoral bifurcations are normally patent bilaterally. Review of the MIP images confirms  the above findings. NON-VASCULAR Hepatobiliary: No focal liver abnormality is seen. Status post cholecystectomy. No biliary dilatation. Pancreas: Unremarkable. No pancreatic ductal dilatation or surrounding inflammatory changes. Spleen: Normal in size without focal abnormality. Adrenals/Urinary Tract: Small lateral interpolar cortical mass visualized on the arterial phase and measures approximately 1.2 cm. This shows no significant growth since the prior study in 2017. Stable cyst of the contralateral left kidney. Adrenal glands appear unremarkable. The bladder is decompressed. Stomach/Bowel: Bowel shows no evidence of obstruction or inflammation. Diverticulosis of the sigmoid colon noted without evidence of acute diverticulitis. Lymphatic: No enlarged lymph nodes identified. Reproductive: Status post hysterectomy. No adnexal masses. Other: No abdominal wall hernia or abnormality. No abdominopelvic ascites. Musculoskeletal: Bones are osteopenic. No fractures or lesions identified. Review of the MIP images confirms the above findings. IMPRESSION: 1. Stable aneurysmal disease of the ascending thoracic aorta which measures 4.6 cm in greatest diameter. There is no evidence of acute aortic dissection or other acute aortic syndrome. 2. Coronary atherosclerosis with calcified plaque in a 3 vessel distribution. 3. Significant cardiac enlargement without pulmonary edema. 4. Evidence of pulmonary hypertension with significant dilatation of the main pulmonary artery and central pulmonary arteries. 5. Stable roughly 1.2 cm right renal mass centered in the lateral interpolar cortex. This shows no significant growth since a prior CT on 11/06/2015. Electronically Signed   By: Aletta Edouard M.D.   On: 03/16/2017 14:25     Microbiology: No results found for this or any previous visit (from the past 240 hour(s)).   Labs: Basic Metabolic Panel:  Recent Labs Lab 04/01/17 1410 04/02/17 1703 04/03/17 0558 04/04/17 0549 04/05/17 0349  NA 139 139 141 141 140  K 4.1 4.3 3.6 3.5 3.8  CL 104 104 102 100* 99*  CO2 28 29 32 31 31  GLUCOSE 122* 101* 89 93 93  BUN 19 20 19 19  25*  CREATININE 1.30* 1.39* 1.31* 1.26* 1.42*  CALCIUM 8.3* 8.2* 8.3* 8.2* 8.3*  MG  --   --   --  2.1 2.0   Liver Function Tests:  Recent Labs Lab 04/02/17 1703  AST 17  ALT 8*  ALKPHOS 62  BILITOT 0.7  PROT 6.5  ALBUMIN 3.7   No results for input(s): LIPASE, AMYLASE in the last 168 hours. No results for input(s): AMMONIA in the last 168 hours. CBC:  Recent Labs Lab 04/01/17 1410 04/02/17 1703  WBC 4.9 5.1  NEUTROABS 2.8 3.0  HGB 10.9* 11.3*  HCT 34.2* 35.3*  MCV 97.2 97.0  PLT 165 167   Cardiac Enzymes:  Recent Labs Lab 04/02/17 1703 04/02/17 2358 04/03/17 0558 04/04/17 1115 04/04/17 1651  TROPONINI <0.03 <0.03 <0.03 <0.03 <0.03   BNP: Invalid input(s): POCBNP CBG: No results for input(s): GLUCAP in the last 168 hours.  Time coordinating discharge:  Greater than 30 minutes  Signed:  Robert Sunga, DO [facility] Pager: [phone] 04/05/2017, 11:46 AM

## 2017-04-05 NOTE — Progress Notes (Signed)
Patient is to be discharged home and in stable condition. IV and telemetry removed, WNL. Patient given discharge instructions and verbalized understanding. All questions and concerns addressed and answered. Patient escorted out by staff via wheelchair.  Celestia Khat, RN

## 2017-04-06 DIAGNOSIS — D509 Iron deficiency anemia, unspecified: Secondary | ICD-10-CM | POA: Diagnosis not present

## 2017-04-06 DIAGNOSIS — Z7902 Long term (current) use of antithrombotics/antiplatelets: Secondary | ICD-10-CM | POA: Diagnosis not present

## 2017-04-06 DIAGNOSIS — Z86718 Personal history of other venous thrombosis and embolism: Secondary | ICD-10-CM | POA: Diagnosis not present

## 2017-04-06 DIAGNOSIS — Z96642 Presence of left artificial hip joint: Secondary | ICD-10-CM | POA: Diagnosis not present

## 2017-04-06 DIAGNOSIS — Z8673 Personal history of transient ischemic attack (TIA), and cerebral infarction without residual deficits: Secondary | ICD-10-CM | POA: Diagnosis not present

## 2017-04-06 DIAGNOSIS — Z87891 Personal history of nicotine dependence: Secondary | ICD-10-CM | POA: Diagnosis not present

## 2017-04-06 DIAGNOSIS — I110 Hypertensive heart disease with heart failure: Secondary | ICD-10-CM | POA: Diagnosis not present

## 2017-04-06 DIAGNOSIS — I2510 Atherosclerotic heart disease of native coronary artery without angina pectoris: Secondary | ICD-10-CM | POA: Diagnosis not present

## 2017-04-06 DIAGNOSIS — I482 Chronic atrial fibrillation: Secondary | ICD-10-CM | POA: Diagnosis not present

## 2017-04-06 DIAGNOSIS — Z96651 Presence of right artificial knee joint: Secondary | ICD-10-CM | POA: Diagnosis not present

## 2017-04-06 DIAGNOSIS — I712 Thoracic aortic aneurysm, without rupture: Secondary | ICD-10-CM | POA: Diagnosis not present

## 2017-04-06 DIAGNOSIS — I351 Nonrheumatic aortic (valve) insufficiency: Secondary | ICD-10-CM | POA: Diagnosis not present

## 2017-04-06 DIAGNOSIS — I5023 Acute on chronic systolic (congestive) heart failure: Secondary | ICD-10-CM | POA: Diagnosis not present

## 2017-04-06 DIAGNOSIS — Z85820 Personal history of malignant melanoma of skin: Secondary | ICD-10-CM | POA: Diagnosis not present

## 2017-04-07 ENCOUNTER — Other Ambulatory Visit (HOSPITAL_COMMUNITY): Payer: Medicare Other

## 2017-04-07 ENCOUNTER — Ambulatory Visit (HOSPITAL_COMMUNITY): Payer: Medicare Other

## 2017-04-07 ENCOUNTER — Telehealth: Payer: Self-pay | Admitting: Family Medicine

## 2017-04-07 NOTE — Telephone Encounter (Signed)
Call placed to [facility], Cheryl to inquire as to current BP readings   LMTRC.

## 2017-04-07 NOTE — Telephone Encounter (Signed)
[name] [name] home health nurse called wanting [place] to be made aware of pt bp she checked it yesterday and it was 100/60 (sitting) and then upon standing 80/50 wants [place] to advise about what she needs to do. Also pulse was in the 80s.

## 2017-04-08 NOTE — Telephone Encounter (Signed)
Call placed to House, [facility].   Reports that patient BP remained slightly low, but patient noted to be asymptomatic. Patient denied feelings of dizziness, nausea, fatigue, malaise. States that patient was up and ambulating without difficulty. Upon repeat BP remained within 110/ 60's.   Advised to continue to monitor BP. Notify MD if patient becomes symptomatic.

## 2017-04-09 DIAGNOSIS — I5023 Acute on chronic systolic (congestive) heart failure: Secondary | ICD-10-CM | POA: Diagnosis not present

## 2017-04-09 DIAGNOSIS — I351 Nonrheumatic aortic (valve) insufficiency: Secondary | ICD-10-CM | POA: Diagnosis not present

## 2017-04-09 DIAGNOSIS — I712 Thoracic aortic aneurysm, without rupture: Secondary | ICD-10-CM | POA: Diagnosis not present

## 2017-04-09 DIAGNOSIS — I2510 Atherosclerotic heart disease of native coronary artery without angina pectoris: Secondary | ICD-10-CM | POA: Diagnosis not present

## 2017-04-09 DIAGNOSIS — I482 Chronic atrial fibrillation: Secondary | ICD-10-CM | POA: Diagnosis not present

## 2017-04-09 DIAGNOSIS — I110 Hypertensive heart disease with heart failure: Secondary | ICD-10-CM | POA: Diagnosis not present

## 2017-04-09 NOTE — Telephone Encounter (Signed)
As long as she is asymptomatic, I would not change meds.  Recently was diuresed due to CHF.  Low bp could be due to slight dehydration.

## 2017-04-09 NOTE — Telephone Encounter (Signed)
Call placed to [place], [place].

## 2017-04-10 ENCOUNTER — Other Ambulatory Visit: Payer: Self-pay

## 2017-04-10 DIAGNOSIS — I5023 Acute on chronic systolic (congestive) heart failure: Secondary | ICD-10-CM | POA: Diagnosis not present

## 2017-04-10 DIAGNOSIS — I712 Thoracic aortic aneurysm, without rupture: Secondary | ICD-10-CM | POA: Diagnosis not present

## 2017-04-10 DIAGNOSIS — I110 Hypertensive heart disease with heart failure: Secondary | ICD-10-CM | POA: Diagnosis not present

## 2017-04-10 DIAGNOSIS — I2510 Atherosclerotic heart disease of native coronary artery without angina pectoris: Secondary | ICD-10-CM | POA: Diagnosis not present

## 2017-04-10 DIAGNOSIS — I482 Chronic atrial fibrillation: Secondary | ICD-10-CM | POA: Diagnosis not present

## 2017-04-10 DIAGNOSIS — I351 Nonrheumatic aortic (valve) insufficiency: Secondary | ICD-10-CM | POA: Diagnosis not present

## 2017-04-10 MED ORDER — CARVEDILOL 3.125 MG PO TABS: 3.1250 mg | ORAL_TABLET | Freq: Two times a day (BID) | ORAL | 6 refills | Status: DC

## 2017-04-10 NOTE — Telephone Encounter (Signed)
Call placed to [place], [place] aware to continue to monitor and to notify MD if patient becomes symptomatic.

## 2017-04-10 NOTE — Telephone Encounter (Signed)
Refill complete per fax request from [facility] for Coreg 3.125 mg BID

## 2017-04-13 ENCOUNTER — Encounter: Payer: Self-pay | Admitting: Family Medicine

## 2017-04-13 ENCOUNTER — Ambulatory Visit (INDEPENDENT_AMBULATORY_CARE_PROVIDER_SITE_OTHER): Payer: Medicare Other | Admitting: Family Medicine

## 2017-04-13 VITALS — BP 114/68 | HR 72 | Temp 97.3°F | Resp 14 | Ht 65.0 in | Wt 159.0 lb

## 2017-04-13 DIAGNOSIS — Z09 Encounter for follow-up examination after completed treatment for conditions other than malignant neoplasm: Secondary | ICD-10-CM

## 2017-04-13 DIAGNOSIS — Z23 Encounter for immunization: Secondary | ICD-10-CM | POA: Diagnosis not present

## 2017-04-13 DIAGNOSIS — I5021 Acute systolic (congestive) heart failure: Secondary | ICD-10-CM

## 2017-04-13 DIAGNOSIS — D508 Other iron deficiency anemias: Secondary | ICD-10-CM

## 2017-04-13 DIAGNOSIS — I351 Nonrheumatic aortic (valve) insufficiency: Secondary | ICD-10-CM

## 2017-04-13 LAB — CBC WITH DIFFERENTIAL/PLATELET
BASOS PCT: 0.7 %
Basophils Absolute: 38 cells/uL (ref 0–200)
EOS PCT: 2.8 %
Eosinophils Absolute: 151 cells/uL (ref 15–500)
HCT: 38.7 % (ref 35.0–45.0)
Hemoglobin: 12.6 g/dL (ref 11.7–15.5)
Lymphs Abs: 1998 cells/uL (ref 850–3900)
MCH: 30.5 pg (ref 27.0–33.0)
MCHC: 32.6 g/dL (ref 32.0–36.0)
MCV: 93.7 fL (ref 80.0–100.0)
MONOS PCT: 8.7 %
MPV: 10.4 fL (ref 7.5–12.5)
NEUTROS ABS: 2743 {cells}/uL (ref 1500–7800)
Neutrophils Relative %: 50.8 %
PLATELETS: 163 10*3/uL (ref 140–400)
RBC: 4.13 10*6/uL (ref 3.80–5.10)
RDW: 14.2 % (ref 11.0–15.0)
TOTAL LYMPHOCYTE: 37 %
WBC mixed population: 470 cells/uL (ref 200–950)
WBC: 5.4 10*3/uL (ref 3.8–10.8)

## 2017-04-13 LAB — BASIC METABOLIC PANEL WITH GFR
BUN / CREAT RATIO: 19 (calc) (ref 6–22)
BUN: 34 mg/dL — AB (ref 7–25)
CALCIUM: 9.1 mg/dL (ref 8.6–10.4)
CHLORIDE: 102 mmol/L (ref 98–110)
CO2: 28 mmol/L (ref 20–32)
Creat: 1.8 mg/dL — ABNORMAL HIGH (ref 0.60–0.88)
GFR, Est African American: 28 mL/min/{1.73_m2} — ABNORMAL LOW (ref >=60)
GFR, Est Non African American: 24 mL/min/{1.73_m2} — ABNORMAL LOW (ref >=60)
GLUCOSE: 94 mg/dL (ref 65–99)
Potassium: 4.8 mmol/L (ref 3.5–5.3)
Sodium: 140 mmol/L (ref 135–146)

## 2017-04-13 NOTE — Progress Notes (Signed)
Subjective:    Patient ID: [name] [name], female    DOB: 1922/10/21, 83 y.o.   MRN: [id number]  HPI Patient was recently admitted back to the hospital. I have copied relevant portions of the discharge summary and included them below for reference: Admit date: 04/02/2017 Discharge date: 04/05/2017  Admitted From: Home Disposition:  Home  Recommendations for Outpatient Follow-up:  1. Follow up with PCP in 1-2 weeks 2. Please obtain BMP/CBC in one week   Home Health: YES Equipment/Devices: PT, RN  Discharge Condition: Stable CODE STATUS: DNR Diet recommendation: Heart Healthy   Brief/Interim Summary: 83 year old female with a history of stress-induced cardiomyopathy with EF 35-40 percent, nonobstructive CAD, chronic atrial fibrillation, R-DCIS, iron deficiency anemia, hypertension, hyperlipidemia, TIA, ascending aortic aneurysm presenting with one-day history of shortness of breath, nonproductive cough, and orthopnea type symptoms. The patient saw cardiology on 04/01/2017 at which time, there was noted weight gain and lower extremity edema. The patient was instructed to increase her furosemide to 20 mg twice a day. The patient was discharged from the hospital after a stay from 03/16/2017 through 03/19/2017 during which time the patient underwent a cardiac catheterization revealing nonobstructive CAD. The patient was instructed to start furosemide 20 mg daily at the time of that discharge. Her daughter states that the patient did not start her furosemide until approximately one week prior to this admission. During her office visit on 04/01/2017, there was concern about dietary indiscretion. Chest x-ray at the time of admission revealed increasing pulmonary venous congestion. CT angiogram of the chest was negative for pulmonary embolus. After admission, the patient was started on intravenous furosemide with good clinical effect.  Discharge Diagnoses:  Acute on chronic systolic  CHF -35/59/7416 discharge weight 164 -04/02/2017 admission weight 170 -NEG 11 lbs--discharge weight 159 -Continue intravenous furosemide 20 mg IV q 8 -accurate I/O's -fluid restrict -Echo--EF 35-40%, dyskinesis mid apical anteroseptal, moderate TR, PAS 4042 -continue IV Lasix-->po lasix 20 mg bid -NEG 6.2LL for the admission, question intake accuracy -03/18/2017 heart catheterization--mild nonobstructive CAD -Continue carvedilol  Acute respiratory failure with hypoxia -Secondary to pulmonary edema -Stable on 2 L-->weaned to RA -CTA chest neg for PE  Chronic atrial fibrillation -Rate controlled -Continue carvedilol -CHADSVASc = 8 -not on AC due to GIB and iron deficiency anemia -Continue Plavix  CKD stage 3 -Baseline creatinine 1.0-1.3 -Monitor with diuresis  Iron deficiency anemia -Repeat iron studies--iron saturation 14%, ferritin 87 -Baseline hemoglobin approximately 12  -nulecit x 1 given  Lower extremity edema/pain -superficial clot in right superficial femoral vein  Ascending aortic aneurysm -Measuring 4.5 cm on CT angiogram chest without dissection -poor surgical candidate -continue outpt surveillance  Chronic pain -continue tramadol  Patient was given Lasix 20 mg by mouth twice a day on discharge. I received a call last week regarding hypotension that was asymptomatic. Her blood pressure today is 114/68. [name] [name] weighs 159 pounds. Wt Readings from Last 3 Encounters:  04/13/17 159 lb (72.1 kg)  04/05/17 159 lb 6.3 oz (72.3 kg)  04/01/17 169 lb (76.7 kg)  Patient's renal function is elevated. On September 2, her creatinine was 1.4 suggesting a glomerular filtration rate of 31 mL of blood per minute. Patient is borderline stage IV chronic kidney disease. [name] [name] is not on an ACE inhibitor which I suspect is due to her borderline low blood pressure and stage IV chronic kidney disease.  Since discharge from the hospital, the patient denies any further shortness  of breath. [name] [name] denies any chest pain. [name] [name]  is maintained a stable weight. Although her blood pressure at home has dipped below 811 systolic, [name] [name] has been asymptomatic. [name] [name] denies any syncope or near syncope. [name] [name] denies any fatigue or lightheadedness. Past Medical History:  Diagnosis Date  . Aneurysm of aorta (HCC)   . Aneurysm, thoracic aortic ([place])   . Aortic insufficiency   . Ascending aortic aneurysm ([place])    CT in 2012 - 4.2x4.2cm  . Atrial fibrillation ([place])   . Bladder infection    h/o  . Coronary artery disease   . 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 Dehydration    HISTORY   . Fall at home 09/10/12  . Hemorrhoids   . Hip fracture ([place])    hip surgery 2001  . History of blood clots    in leg  . History of knee surgery   . Hypertension   . IDA (iron deficiency anemia) 06/07/2013  . Iron deficiency anemia, unspecified 03/14/2013   secondary to GI blood loss  . Kidney infection    h/o  . Melanoma of skin ([place]) 03/14/2013  . Melanoma of thigh ([place])    left  . Mini stroke ([place])   . S/P colonoscopy March 2010   RMR: friable anal canal hemorrhoids, hyperplastic ascending polyp, adenomatous descending polyp   . Stomach ulcer    secondary to h.pylori, s/p treatment  . Thyroid condition   . Venous stasis    edema   Past Surgical History:  Procedure Laterality Date  . APPENDECTOMY  1942  . BREAST LUMPECTOMY  1998  . CARDIAC CATHETERIZATION    . CATARACT EXTRACTION, BILATERAL    . CHOLECYSTECTOMY    . COLONOSCOPY  06/05/11   pancolonic diverticulosis/ileal reosion/abnormal anorectal junction s/p biopsy: path for small intestine and Ti was benign with non-villous atrophy, rectal biopsy with prominent prolapse changes, no acute inflammation  . CORNEAL TRANSPLANT Bilateral 2013  . ENTEROSCOPY N/A 06/13/2013   BJY:NWGN Gastritis/GI BLEED MOST LIKELY DUE TO DUODENAL ULCERS, AND ? AVMs  . ESOPHAGOGASTRODUODENOSCOPY  06/05/11   small hiatal hernia; +  H.PYLORI GASTRITIS, s/p 5 days of Pylera, unable to finish due to N/V  . ESOPHAGOGASTRODUODENOSCOPY N/A 06/13/2013   Dr. Oneida Alar- see enteroscopy  . ESOPHAGOGASTRODUODENOSCOPY (EGD) WITH ESOPHAGEAL DILATION N/A 06/08/2013   FAO:ZHYQM hiatal hernia;  otherwise normal EGD s/p dilation  . GIVENS CAPSULE STUDY N/A 06/08/2013   Procedure: GIVENS CAPSULE STUDY;  Surgeon: Daneil Dolin, MD;  Location: [facility];  Service: Endoscopy;  Laterality: N/A;  . HEMORRHOID BANDING    . KNEE SURGERY Right 05/2006   total right  . LEFT HEART CATH AND CORONARY ANGIOGRAPHY N/A 03/18/2017   Procedure: LEFT HEART CATH AND CORONARY ANGIOGRAPHY;  Surgeon: Wellington Hampshire, MD;  Location: [facility];  Service: Cardiovascular;  Laterality: N/A;  . LEG SURGERY    . MELANOMA EXCISION Left 08/2006   left leg excision  . NM MYOCAR PERF WALL MOTION  03/27/2010   dipyridamole; small reversible basal to mid-septal defect (?artifact), post-stress EF 55%, low risk scan   . PARTIAL HYSTERECTOMY  1976  . TONSILLECTOMY    . TOTAL HIP ARTHROPLASTY Left 2001&2004    X 2 FOR LEFT HIP  . TRANSTHORACIC ECHOCARDIOGRAM  10/06/2012   EF 55-60%, mod eccentric hypertrophy, grade 2 diastolic dysfunction; mildly calcifed AV annulus with moderate regurg; aortic root mildly dilated; LA severely dailted; PA peak pressure 60mHg  . VARICOSE VEIN SURGERY  Current Outpatient Prescriptions on File Prior to Visit  Medication Sig Dispense Refill  . albuterol (PROAIR HFA) 108 (90 Base) MCG/ACT inhaler Inhale 1-2 puffs into the lungs every 6 (six) hours as needed for wheezing or shortness of breath.    . carvedilol (COREG) 3.125 MG tablet Take 1 tablet (3.125 mg total) by mouth 2 (two) times daily with a meal. 60 tablet 6  . clopidogrel (PLAVIX) 75 MG tablet Take 75 mg by mouth daily with breakfast.    . cyanocobalamin ([employer] VITAMIN B12) 2000 MCG tablet Take 1 tablet (2,000 mcg total) by mouth daily. 30 tablet 11  . erythromycin  ophthalmic ointment 1 application as needed (for eye infection).     . folic acid (FOLVITE) 1 MG tablet Take 1 tablet (1 mg total) by mouth daily. 30 tablet 11  . furosemide (LASIX) 20 MG tablet Take 1 tablet (20 mg total) by mouth 2 (two) times daily. 60 tablet 0  . gabapentin (NEURONTIN) 100 MG capsule TAKE 2 CAPSULES BY MOUTH 3  TIMES DAILY (Patient taking differently: Take 200 mg by mouth three times a day) 540 capsule 1  . hydrocortisone (ANUSOL-HC) 25 MG suppository Place 1 suppository (25 mg total) rectally every 12 (twelve) hours. (Patient taking differently: Place 25 mg rectally 2 (two) times daily. ) 12 suppository 1  . ondansetron (ZOFRAN) 4 MG tablet Take 1 tablet (4 mg total) by mouth every 8 (eight) hours as needed for nausea or vomiting. (Patient taking differently: Take 4 mg by mouth daily before breakfast. ) 30 tablet 0  . pantoprazole (PROTONIX) 40 MG tablet Take 1 tablet (40 mg total) by mouth 2 (two) times daily. 60 tablet 11  . potassium chloride SA (K-DUR,KLOR-CON) 20 MEQ tablet Take 2 tablets (40 mEq total) by mouth daily as needed (when taking lasix). 60 tablet 0  . prednisoLONE acetate (PRED FORTE) 1 % ophthalmic suspension Place 1 drop into both eyes 4 (four) times daily.     . psyllium (METAMUCIL) 58.6 % packet Take 1 packet by mouth daily as needed (for constipation).     . sodium chloride (MURO 128) 2 % ophthalmic solution Place 1 drop into both eyes 3 (three) times daily.     . traMADol-acetaminophen (ULTRACET) 37.5-325 MG tablet Take 1 tablet by mouth every 6 (six) hours as needed for moderate pain or severe pain. (Patient taking differently: Take 1 tablet by mouth every 4 (four) hours as needed for moderate pain or severe pain. #180/ 30 days) 180 tablet 2   No current facility-administered medications on file prior to visit.    Allergies  Allergen Reactions  . Codeine Diarrhea and Nausea Only  . Tape Other (See Comments)    SKIN IS VERY THIN AND TEARS AND BRUISES  EASILY; PLEASE USE COBAN WRAP!!  . Diona Fanti [Aspirin] Rash and Other (See Comments)    Petechia   . Iron Nausea And Vomiting    Oral iron causes nausea and vomiting  . Penicillins Rash    Has patient had a PCN reaction causing immediate rash, facial/tongue/throat swelling, SOB or lightheadedness with hypotension: Yes Has patient had a PCN reaction causing severe rash involving mucus membranes or skin necrosis: Unknown Has patient had a PCN reaction that required hospitalization: No Has patient had a PCN reaction occurring within the last 10 years: No If all of the above answers are "NO", then may proceed with Cephalosporin use.    Social History   Social History  . Marital status: Widowed  Spouse name: N/A  . Number of children: 5  . Years of education: 6   Occupational History  .  Retired   Social History Main Topics  . Smoking status: Former Smoker    Packs/day: 1.50    Years: 20.00    Types: Cigarettes    Quit date: 07/05/1975  . Smokeless tobacco: Never Used     Comment: Quit smoking in 1975  . Alcohol use No  . Drug use: No  . Sexual activity: No   Other Topics Concern  . Not on file   Social History Narrative  . No narrative on file       Review of Systems  All other systems reviewed and are negative.      Objective:   Physical Exam  Constitutional: [name] [name] appears well-developed and well-nourished. No distress.  HENT:  Head: Normocephalic and atraumatic.  Neck: Normal range of motion. Neck supple. No JVD present.  Cardiovascular: Normal rate, regular rhythm and normal heart sounds.   Pulmonary/Chest: Effort normal. [name] [name] has rales.  Abdominal: Soft. Bowel sounds are normal.  Musculoskeletal: [name] [name] exhibits no edema.  Skin: [name] [name] is not diaphoretic.  Vitals reviewed.  Large tender varicose veins in both legs left greater than right. There is no pitting edema however in her legs. [name] [name] has faint rhonchorous breath sounds bilaterally. However [name] [name] appears euvolemic  and there is no evidence of fluid overload       Assessment & Plan:  Hospital discharge follow-up - Plan: CBC with Differential/Platelet, BASIC METABOLIC PANEL WITH GFR Patient is euvolemic today. Her blood pressure is acceptable. Her weight remains stable. There is no evidence of fluid overload on her exam. I believe [name] [name] needs to continue this dose of diuretic, Lasix 20 mg by mouth twice a day, to remain euvolemic. I will check her renal function. Assuming [name] [name] is not developing prerenal azotemia and her potassium is acceptable, I would like to start the patient on lisinopril 5 mg a day to hopefully help reduce her morbidity and mortality from CHF. We will need to monitor her blood pressure in her renal function closely. Also in the umbilicus is an erythematous rash which is serpiginous and appears to be fungal. It is confined to the umbilicus. I recommended Lotrimin cream 2-3 times a day for 1 week to resolve.

## 2017-04-14 ENCOUNTER — Telehealth: Payer: Self-pay | Admitting: Family Medicine

## 2017-04-14 ENCOUNTER — Other Ambulatory Visit: Payer: Self-pay | Admitting: Family Medicine

## 2017-04-14 DIAGNOSIS — I5023 Acute on chronic systolic (congestive) heart failure: Secondary | ICD-10-CM | POA: Diagnosis not present

## 2017-04-14 DIAGNOSIS — I712 Thoracic aortic aneurysm, without rupture: Secondary | ICD-10-CM | POA: Diagnosis not present

## 2017-04-14 DIAGNOSIS — I2510 Atherosclerotic heart disease of native coronary artery without angina pectoris: Secondary | ICD-10-CM | POA: Diagnosis not present

## 2017-04-14 DIAGNOSIS — I482 Chronic atrial fibrillation: Secondary | ICD-10-CM | POA: Diagnosis not present

## 2017-04-14 DIAGNOSIS — I110 Hypertensive heart disease with heart failure: Secondary | ICD-10-CM | POA: Diagnosis not present

## 2017-04-14 DIAGNOSIS — I351 Nonrheumatic aortic (valve) insufficiency: Secondary | ICD-10-CM | POA: Diagnosis not present

## 2017-04-14 MED ORDER — LISINOPRIL 5 MG PO TABS: 2.5000 mg | ORAL_TABLET | Freq: Every day | ORAL | 3 refills | Status: DC

## 2017-04-14 NOTE — Telephone Encounter (Signed)
LIZ from [facility] called and states that pt BP is 90/50 and pt has no dizziness or any other symptoms.   Dr. Dennard Schaumann aware. Follow up apt made with Joaquim Lai for next week (See last lab report)

## 2017-04-15 ENCOUNTER — Other Ambulatory Visit (HOSPITAL_COMMUNITY): Payer: Medicare Other

## 2017-04-15 ENCOUNTER — Telehealth: Payer: Self-pay | Admitting: Physician Assistant

## 2017-04-15 ENCOUNTER — Ambulatory Visit (HOSPITAL_COMMUNITY): Payer: Medicare Other

## 2017-04-15 DIAGNOSIS — I712 Thoracic aortic aneurysm, without rupture: Secondary | ICD-10-CM | POA: Diagnosis not present

## 2017-04-15 DIAGNOSIS — I5023 Acute on chronic systolic (congestive) heart failure: Secondary | ICD-10-CM | POA: Diagnosis not present

## 2017-04-15 DIAGNOSIS — I2510 Atherosclerotic heart disease of native coronary artery without angina pectoris: Secondary | ICD-10-CM | POA: Diagnosis not present

## 2017-04-15 DIAGNOSIS — I482 Chronic atrial fibrillation: Secondary | ICD-10-CM | POA: Diagnosis not present

## 2017-04-15 DIAGNOSIS — I110 Hypertensive heart disease with heart failure: Secondary | ICD-10-CM | POA: Diagnosis not present

## 2017-04-15 DIAGNOSIS — I351 Nonrheumatic aortic (valve) insufficiency: Secondary | ICD-10-CM | POA: Diagnosis not present

## 2017-04-15 MED ORDER — CARVEDILOL 3.125 MG PO TABS: 3.1250 mg | ORAL_TABLET | Freq: Two times a day (BID) | ORAL | 3 refills | Status: DC

## 2017-04-15 NOTE — Telephone Encounter (Signed)
carvedilol (COREG) 3.125 MG tablet [[id number]]  Rx needs to be sent to [facility]

## 2017-04-15 NOTE — Telephone Encounter (Signed)
DONE

## 2017-04-16 DIAGNOSIS — I712 Thoracic aortic aneurysm, without rupture: Secondary | ICD-10-CM | POA: Diagnosis not present

## 2017-04-16 DIAGNOSIS — I5023 Acute on chronic systolic (congestive) heart failure: Secondary | ICD-10-CM | POA: Diagnosis not present

## 2017-04-16 DIAGNOSIS — I482 Chronic atrial fibrillation: Secondary | ICD-10-CM | POA: Diagnosis not present

## 2017-04-16 DIAGNOSIS — I110 Hypertensive heart disease with heart failure: Secondary | ICD-10-CM | POA: Diagnosis not present

## 2017-04-16 DIAGNOSIS — I351 Nonrheumatic aortic (valve) insufficiency: Secondary | ICD-10-CM | POA: Diagnosis not present

## 2017-04-16 DIAGNOSIS — I2510 Atherosclerotic heart disease of native coronary artery without angina pectoris: Secondary | ICD-10-CM | POA: Diagnosis not present

## 2017-04-17 DIAGNOSIS — I5023 Acute on chronic systolic (congestive) heart failure: Secondary | ICD-10-CM | POA: Diagnosis not present

## 2017-04-17 DIAGNOSIS — I482 Chronic atrial fibrillation: Secondary | ICD-10-CM | POA: Diagnosis not present

## 2017-04-17 DIAGNOSIS — I712 Thoracic aortic aneurysm, without rupture: Secondary | ICD-10-CM | POA: Diagnosis not present

## 2017-04-17 DIAGNOSIS — I351 Nonrheumatic aortic (valve) insufficiency: Secondary | ICD-10-CM | POA: Diagnosis not present

## 2017-04-17 DIAGNOSIS — I2510 Atherosclerotic heart disease of native coronary artery without angina pectoris: Secondary | ICD-10-CM | POA: Diagnosis not present

## 2017-04-17 DIAGNOSIS — I110 Hypertensive heart disease with heart failure: Secondary | ICD-10-CM | POA: Diagnosis not present

## 2017-04-21 ENCOUNTER — Encounter: Payer: Self-pay | Admitting: Family Medicine

## 2017-04-21 ENCOUNTER — Ambulatory Visit (INDEPENDENT_AMBULATORY_CARE_PROVIDER_SITE_OTHER): Payer: Medicare Other | Admitting: Family Medicine

## 2017-04-21 VITALS — BP 82/62 | HR 67 | Temp 98.0°F | Resp 16 | Ht 65.0 in | Wt 160.0 lb

## 2017-04-21 DIAGNOSIS — E876 Hypokalemia: Secondary | ICD-10-CM | POA: Diagnosis not present

## 2017-04-21 DIAGNOSIS — I110 Hypertensive heart disease with heart failure: Secondary | ICD-10-CM | POA: Diagnosis not present

## 2017-04-21 DIAGNOSIS — I5023 Acute on chronic systolic (congestive) heart failure: Secondary | ICD-10-CM | POA: Diagnosis not present

## 2017-04-21 DIAGNOSIS — I351 Nonrheumatic aortic (valve) insufficiency: Secondary | ICD-10-CM | POA: Diagnosis not present

## 2017-04-21 DIAGNOSIS — I2510 Atherosclerotic heart disease of native coronary artery without angina pectoris: Secondary | ICD-10-CM | POA: Diagnosis not present

## 2017-04-21 DIAGNOSIS — I5021 Acute systolic (congestive) heart failure: Secondary | ICD-10-CM

## 2017-04-21 DIAGNOSIS — I712 Thoracic aortic aneurysm, without rupture: Secondary | ICD-10-CM | POA: Diagnosis not present

## 2017-04-21 DIAGNOSIS — I482 Chronic atrial fibrillation: Secondary | ICD-10-CM | POA: Diagnosis not present

## 2017-04-21 LAB — BASIC METABOLIC PANEL WITH GFR
BUN / CREAT RATIO: 18 (calc) (ref 6–22)
BUN: 33 mg/dL — ABNORMAL HIGH (ref 7–25)
CHLORIDE: 106 mmol/L (ref 98–110)
CO2: 24 mmol/L (ref 20–32)
Calcium: 8.6 mg/dL (ref 8.6–10.4)
Creat: 1.79 mg/dL — ABNORMAL HIGH (ref 0.60–0.88)
GFR, Est African American: 28 mL/min/{1.73_m2} — ABNORMAL LOW (ref >=60)
GFR, Est Non African American: 24 mL/min/{1.73_m2} — ABNORMAL LOW (ref >=60)
GLUCOSE: 94 mg/dL (ref 65–99)
POTASSIUM: 4.5 mmol/L (ref 3.5–5.3)
SODIUM: 140 mmol/L (ref 135–146)

## 2017-04-21 MED ORDER — POTASSIUM CHLORIDE CRYS ER 20 MEQ PO TBCR: 20.0000 meq | EXTENDED_RELEASE_TABLET | Freq: Every day | ORAL | 2 refills | Status: DC

## 2017-04-21 MED ORDER — ONDANSETRON HCL 4 MG PO TABS: 4.0000 mg | ORAL_TABLET | Freq: Three times a day (TID) | ORAL | 1 refills | Status: DC | PRN

## 2017-04-21 MED ORDER — CARVEDILOL 3.125 MG PO TABS: 3.1250 mg | ORAL_TABLET | Freq: Two times a day (BID) | ORAL | 3 refills | Status: DC

## 2017-04-21 NOTE — Progress Notes (Signed)
Subjective:    Patient ID: [name] [name], female    DOB: 1923/03/22, 83 y.o.   MRN: [id number]  HPI Patient was recently admitted back to the hospital. I have copied relevant portions of the discharge summary and included them below for reference: Admit date: 04/02/2017 Discharge date: 04/05/2017  Admitted From: Home Disposition:  Home  Recommendations for Outpatient Follow-up:  1. Follow up with PCP in 1-2 weeks 2. Please obtain BMP/CBC in one week   Home Health: YES Equipment/Devices: PT, RN  Discharge Condition: Stable CODE STATUS: DNR Diet recommendation: Heart Healthy   Brief/Interim Summary: 83 year old female with a history of stress-induced cardiomyopathy with EF 35-40 percent, nonobstructive CAD, chronic atrial fibrillation, R-DCIS, iron deficiency anemia, hypertension, hyperlipidemia, TIA, ascending aortic aneurysm presenting with one-day history of shortness of breath, nonproductive cough, and orthopnea type symptoms. The patient saw cardiology on 04/01/2017 at which time, there was noted weight gain and lower extremity edema. The patient was instructed to increase her furosemide to 20 mg twice a day. The patient was discharged from the hospital after a stay from 03/16/2017 through 03/19/2017 during which time the patient underwent a cardiac catheterization revealing nonobstructive CAD. The patient was instructed to start furosemide 20 mg daily at the time of that discharge. Her daughter states that the patient did not start her furosemide until approximately one week prior to this admission. During her office visit on 04/01/2017, there was concern about dietary indiscretion. Chest x-ray at the time of admission revealed increasing pulmonary venous congestion. CT angiogram of the chest was negative for pulmonary embolus. After admission, the patient was started on intravenous furosemide with good clinical effect.  Discharge Diagnoses:  Acute on chronic systolic  CHF -50/93/2671 discharge weight 164 -04/02/2017 admission weight 170 -NEG 11 lbs--discharge weight 159 -Continue intravenous furosemide 20 mg IV q 8 -accurate I/O's -fluid restrict -Echo--EF 35-40%, dyskinesis mid apical anteroseptal, moderate TR, PAS 4042 -continue IV Lasix-->po lasix 20 mg bid -NEG 6.2LL for the admission, question intake accuracy -03/18/2017 heart catheterization--mild nonobstructive CAD -Continue carvedilol  Acute respiratory failure with hypoxia -Secondary to pulmonary edema -Stable on 2 L-->weaned to RA -CTA chest neg for PE  Chronic atrial fibrillation -Rate controlled -Continue carvedilol -CHADSVASc = 8 -not on AC due to GIB and iron deficiency anemia -Continue Plavix  CKD stage 3 -Baseline creatinine 1.0-1.3 -Monitor with diuresis  Iron deficiency anemia -Repeat iron studies--iron saturation 14%, ferritin 87 -Baseline hemoglobin approximately 12  -nulecit x 1 given  Lower extremity edema/pain -superficial clot in right superficial femoral vein  Ascending aortic aneurysm -Measuring 4.5 cm on CT angiogram chest without dissection -poor surgical candidate -continue outpt surveillance  Chronic pain -continue tramadol  Patient was given Lasix 20 mg by mouth twice a day on discharge. I received a call last week regarding hypotension that was asymptomatic. Her blood pressure today is 114/68. She weighs 159 pounds. Wt Readings from Last 3 Encounters:  04/21/17 160 lb (72.6 kg)  04/13/17 159 lb (72.1 kg)  04/05/17 159 lb 6.3 oz (72.3 kg)  Patient's renal function is elevated. On September 2, her creatinine was 1.4 suggesting a glomerular filtration rate of 31 mL of blood per minute. Patient is borderline stage IV chronic kidney disease. She is not on an ACE inhibitor which I suspect is due to her borderline low blood pressure and stage IV chronic kidney disease.  Since discharge from the hospital, the patient denies any further shortness  of breath. She denies any chest pain. She  is maintained a stable weight. Although her blood pressure at home has dipped below 357 systolic, she has been asymptomatic. She denies any syncope or near syncope. She denies any fatigue or lightheadedness.  At that time, my plan was: Patient is euvolemic today. Her blood pressure is acceptable. Her weight remains stable. There is no evidence of fluid overload on her exam. I believe she needs to continue this dose of diuretic, Lasix 20 mg by mouth twice a day, to remain euvolemic. I will check her renal function. Assuming she is not developing prerenal azotemia and her potassium is acceptable, I would like to start the patient on lisinopril 5 mg a day to hopefully help reduce her morbidity and mortality from CHF. We will need to monitor her blood pressure in her renal function closely. Also in the umbilicus is an erythematous rash which is serpiginous and appears to be fungal. It is confined to the umbilicus. I recommended Lotrimin cream 2-3 times a day for 1 week to resolve.  04/21/17 On the lab work I obtained last for the patient, her creatinine was climbing suggesting prerenal azotemia. I had the patient reduce her dose of Lasix to 20 mg once daily. Also had her decrease her dose of potassium. I did start her on lisinopril 2.5 mg a day. Her weight has remained stable since I last saw her. She denies any chest pain or shortness of breath or orthopnea. Unfortunately she feels extremely dizzy and weak which I attribute to her very low blood pressure likely coming from the lisinopril. Otherwise she is doing well Past Medical History:  Diagnosis Date  . Aneurysm of aorta (HCC)   . Aneurysm, thoracic aortic ([place])   . Aortic insufficiency   . Ascending aortic aneurysm ([place])    CT in 2012 - 4.2x4.2cm  . Atrial fibrillation ([place])   . Bladder infection    h/o  . Coronary artery disease   . 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  Dehydration    HISTORY   . Fall at home 09/10/12  . Hemorrhoids   . Hip fracture ([place])    hip surgery 2001  . History of blood clots    in leg  . History of knee surgery   . Hypertension   . IDA (iron deficiency anemia) 06/07/2013  . Iron deficiency anemia, unspecified 03/14/2013   secondary to GI blood loss  . Kidney infection    h/o  . Melanoma of skin ([place]) 03/14/2013  . Melanoma of thigh ([place])    left  . Mini stroke ([place])   . S/P colonoscopy March 2010   RMR: friable anal canal hemorrhoids, hyperplastic ascending polyp, adenomatous descending polyp   . Stomach ulcer    secondary to h.pylori, s/p treatment  . Thyroid condition   . Venous stasis    edema   Past Surgical History:  Procedure Laterality Date  . APPENDECTOMY  1942  . BREAST LUMPECTOMY  1998  . CARDIAC CATHETERIZATION    . CATARACT EXTRACTION, BILATERAL    . CHOLECYSTECTOMY    . COLONOSCOPY  06/05/11   pancolonic diverticulosis/ileal reosion/abnormal anorectal junction s/p biopsy: path for small intestine and Ti was benign with non-villous atrophy, rectal biopsy with prominent prolapse changes, no acute inflammation  . CORNEAL TRANSPLANT Bilateral 2013  . ENTEROSCOPY N/A 06/13/2013   SVX:BLTJ Gastritis/GI BLEED MOST LIKELY DUE TO DUODENAL ULCERS, AND ? AVMs  . ESOPHAGOGASTRODUODENOSCOPY  06/05/11   small hiatal hernia; +  H.PYLORI GASTRITIS, s/p 5 days of Pylera, unable to finish due to N/V  . ESOPHAGOGASTRODUODENOSCOPY N/A 06/13/2013   Dr. Oneida Alar- see enteroscopy  . ESOPHAGOGASTRODUODENOSCOPY (EGD) WITH ESOPHAGEAL DILATION N/A 06/08/2013   [id number] hiatal hernia;  otherwise normal EGD s/p dilation  . GIVENS CAPSULE STUDY N/A 06/08/2013   Procedure: GIVENS CAPSULE STUDY;  Surgeon: Daneil Dolin, MD;  [facility];  Service: Endoscopy;  Laterality: N/A;  . HEMORRHOID BANDING    . KNEE SURGERY Right 05/2006   total right  . LEFT HEART CATH AND CORONARY ANGIOGRAPHY N/A 03/18/2017   Procedure: LEFT HEART  CATH AND CORONARY ANGIOGRAPHY;  Surgeon: Wellington Hampshire, MD;  Location: [place] [facility] LAB;  Service: Cardiovascular;  Laterality: N/A;  . LEG SURGERY    . MELANOMA EXCISION Left 08/2006   left leg excision  . NM MYOCAR PERF WALL MOTION  03/27/2010   dipyridamole; small reversible basal to mid-septal defect (?artifact), post-stress EF 55%, low risk scan   . PARTIAL HYSTERECTOMY  1976  . TONSILLECTOMY    . TOTAL HIP ARTHROPLASTY Left 2001&2004    X 2 FOR LEFT HIP  . TRANSTHORACIC ECHOCARDIOGRAM  10/06/2012   EF 55-60%, mod eccentric hypertrophy, grade 2 diastolic dysfunction; mildly calcifed AV annulus with moderate regurg; aortic root mildly dilated; LA severely dailted; PA peak pressure 74mHg  . VARICOSE VEIN SURGERY     Current Outpatient Prescriptions on File Prior to Visit  Medication Sig Dispense Refill  . albuterol (PROAIR HFA) 108 (90 Base) MCG/ACT inhaler Inhale 1-2 puffs into the lungs every 6 (six) hours as needed for wheezing or shortness of breath.    . clopidogrel (PLAVIX) 75 MG tablet Take 75 mg by mouth daily with breakfast.    . cyanocobalamin ([employer]S VITAMIN B12) 2000 MCG tablet Take 1 tablet (2,000 mcg total) by mouth daily. 30 tablet 11  . erythromycin ophthalmic ointment 1 application as needed (for eye infection).     . folic acid (FOLVITE) 1 MG tablet Take 1 tablet (1 mg total) by mouth daily. 30 tablet 11  . furosemide (LASIX) 20 MG tablet Take 1 tablet (20 mg total) by mouth 2 (two) times daily. (Patient taking differently: Take 20 mg by mouth daily. ) 60 tablet 0  . gabapentin (NEURONTIN) 100 MG capsule TAKE 2 CAPSULES BY MOUTH 3  TIMES DAILY (Patient taking differently: Take 200 mg by mouth three times a day) 540 capsule 1  . hydrocortisone (ANUSOL-HC) 25 MG suppository Place 1 suppository (25 mg total) rectally every 12 (twelve) hours. (Patient taking differently: Place 25 mg rectally 2 (two) times daily. ) 12 suppository 1  . pantoprazole (PROTONIX) 40 MG tablet Take 1  tablet (40 mg total) by mouth 2 (two) times daily. 60 tablet 11  . prednisoLONE acetate (PRED FORTE) 1 % ophthalmic suspension Place 1 drop into both eyes 4 (four) times daily.     . psyllium (METAMUCIL) 58.6 % packet Take 1 packet by mouth daily as needed (for constipation).     . sodium chloride (MURO 128) 2 % ophthalmic solution Place 1 drop into both eyes 3 (three) times daily.     . traMADol-acetaminophen (ULTRACET) 37.5-325 MG tablet Take 1 tablet by mouth every 6 (six) hours as needed for moderate pain or severe pain. (Patient taking differently: Take 1 tablet by mouth every 4 (four) hours as needed for moderate pain or severe pain. #180/ 30 days) 180 tablet 2   No current facility-administered medications on file  prior to visit.    Allergies  Allergen Reactions  . Codeine Diarrhea and Nausea Only  . Tape Other (See Comments)    SKIN IS VERY THIN AND TEARS AND BRUISES EASILY; PLEASE USE COBAN WRAP!!  . Diona Fanti [Aspirin] Rash and Other (See Comments)    Petechia   . Iron Nausea And Vomiting    Oral iron causes nausea and vomiting  . Penicillins Rash    Has patient had a PCN reaction causing immediate rash, facial/tongue/throat swelling, SOB or lightheadedness with hypotension: Yes Has patient had a PCN reaction causing severe rash involving mucus membranes or skin necrosis: Unknown Has patient had a PCN reaction that required hospitalization: No Has patient had a PCN reaction occurring within the last 10 years: No If all of the above answers are "NO", then may proceed with Cephalosporin use.    Social History   Social History  . Marital status: Widowed    Spouse name: N/A  . Number of children: 5  . Years of education: 6   Occupational History  .  Retired   Social History Main Topics  . Smoking status: Former Smoker    Packs/day: 1.50    Years: 20.00    Types: Cigarettes    Quit date: 07/05/1975  . Smokeless tobacco: Never Used     Comment: Quit smoking in 1975  .  Alcohol use No  . Drug use: No  . Sexual activity: No   Other Topics Concern  . Not on file   Social History Narrative  . No narrative on file       Review of Systems  All other systems reviewed and are negative.      Objective:   Physical Exam  Constitutional: She appears well-developed and well-nourished. No distress.  HENT:  Head: Normocephalic and atraumatic.  Neck: Normal range of motion. Neck supple. No JVD present.  Cardiovascular: Normal rate, regular rhythm and normal heart sounds.   Pulmonary/Chest: Effort normal. No respiratory distress. She has no wheezes. She has no rales. She exhibits no tenderness.  Abdominal: Soft. Bowel sounds are normal.  Musculoskeletal: She exhibits no edema.  Skin: She is not diaphoretic.  Vitals reviewed.  she appears euvolemic and there is no evidence of fluid overload       Assessment & Plan:  Acute systolic congestive heart failure (HCC) - Plan: BASIC METABOLIC PANEL WITH GFR  Hypokalemia - Plan: potassium chloride SA (K-DUR,KLOR-CON) 20 MEQ tablet  Blood pressure is too low today. Discontinue lisinopril. Fortunately the patient seems to be euvolemic on a reduced dose of Lasix. Therefore I will continue Lasix 20 mg a day and repeat her BMP to monitor her potassium as well as her renal function. I instructed the daughter to weigh her every day and if she gains or loses more than 2 pounds in 24 hours, she is to call me to the we can tailor her diuretic on an as-needed basis. The patient is unable to tolerate an ACE inhibitor. She would certainly be unable to tolerate entresto.

## 2017-04-22 DIAGNOSIS — I351 Nonrheumatic aortic (valve) insufficiency: Secondary | ICD-10-CM | POA: Diagnosis not present

## 2017-04-22 DIAGNOSIS — I482 Chronic atrial fibrillation: Secondary | ICD-10-CM | POA: Diagnosis not present

## 2017-04-22 DIAGNOSIS — I712 Thoracic aortic aneurysm, without rupture: Secondary | ICD-10-CM | POA: Diagnosis not present

## 2017-04-22 DIAGNOSIS — I110 Hypertensive heart disease with heart failure: Secondary | ICD-10-CM | POA: Diagnosis not present

## 2017-04-22 DIAGNOSIS — I2510 Atherosclerotic heart disease of native coronary artery without angina pectoris: Secondary | ICD-10-CM | POA: Diagnosis not present

## 2017-04-22 DIAGNOSIS — I5023 Acute on chronic systolic (congestive) heart failure: Secondary | ICD-10-CM | POA: Diagnosis not present

## 2017-04-22 DIAGNOSIS — H3530 Unspecified macular degeneration: Secondary | ICD-10-CM | POA: Diagnosis not present

## 2017-04-23 DIAGNOSIS — I482 Chronic atrial fibrillation: Secondary | ICD-10-CM | POA: Diagnosis not present

## 2017-04-23 DIAGNOSIS — I351 Nonrheumatic aortic (valve) insufficiency: Secondary | ICD-10-CM | POA: Diagnosis not present

## 2017-04-23 DIAGNOSIS — I5023 Acute on chronic systolic (congestive) heart failure: Secondary | ICD-10-CM | POA: Diagnosis not present

## 2017-04-23 DIAGNOSIS — I712 Thoracic aortic aneurysm, without rupture: Secondary | ICD-10-CM | POA: Diagnosis not present

## 2017-04-23 DIAGNOSIS — I110 Hypertensive heart disease with heart failure: Secondary | ICD-10-CM | POA: Diagnosis not present

## 2017-04-23 DIAGNOSIS — I2510 Atherosclerotic heart disease of native coronary artery without angina pectoris: Secondary | ICD-10-CM | POA: Diagnosis not present

## 2017-04-29 ENCOUNTER — Other Ambulatory Visit: Payer: Self-pay | Admitting: Family Medicine

## 2017-04-29 DIAGNOSIS — E876 Hypokalemia: Secondary | ICD-10-CM

## 2017-04-29 MED ORDER — POTASSIUM CHLORIDE CRYS ER 20 MEQ PO TBCR: 20.0000 meq | EXTENDED_RELEASE_TABLET | Freq: Every day | ORAL | 3 refills | Status: DC

## 2017-04-30 DIAGNOSIS — T86840 Corneal transplant rejection: Secondary | ICD-10-CM | POA: Diagnosis not present

## 2017-05-04 DIAGNOSIS — I351 Nonrheumatic aortic (valve) insufficiency: Secondary | ICD-10-CM | POA: Diagnosis not present

## 2017-05-04 DIAGNOSIS — I712 Thoracic aortic aneurysm, without rupture: Secondary | ICD-10-CM | POA: Diagnosis not present

## 2017-05-04 DIAGNOSIS — I482 Chronic atrial fibrillation: Secondary | ICD-10-CM | POA: Diagnosis not present

## 2017-05-04 DIAGNOSIS — I2510 Atherosclerotic heart disease of native coronary artery without angina pectoris: Secondary | ICD-10-CM | POA: Diagnosis not present

## 2017-05-04 DIAGNOSIS — I5023 Acute on chronic systolic (congestive) heart failure: Secondary | ICD-10-CM | POA: Diagnosis not present

## 2017-05-04 DIAGNOSIS — I110 Hypertensive heart disease with heart failure: Secondary | ICD-10-CM | POA: Diagnosis not present

## 2017-05-06 ENCOUNTER — Telehealth: Payer: Self-pay

## 2017-05-06 NOTE — Telephone Encounter (Signed)
Joaquim Lai called and requested tramadol be changed to q4h instead of q6h for patient is it ok to make the rx?

## 2017-05-07 NOTE — Telephone Encounter (Signed)
Ultracet is listed as q 4 hrs on her medlist (180 tabs which would be six a day or every 4 hours)?  That is that she is supposed to be on.

## 2017-05-11 MED ORDER — TRAMADOL-ACETAMINOPHEN 37.5-325 MG PO TABS: 1.0000 | ORAL_TABLET | ORAL | 0 refills | Status: DC | PRN

## 2017-05-11 NOTE — Telephone Encounter (Signed)
Spoke with pharmacy they noted the patient is taking the rx q4h

## 2017-05-20 DIAGNOSIS — I351 Nonrheumatic aortic (valve) insufficiency: Secondary | ICD-10-CM | POA: Diagnosis not present

## 2017-05-20 DIAGNOSIS — I2510 Atherosclerotic heart disease of native coronary artery without angina pectoris: Secondary | ICD-10-CM | POA: Diagnosis not present

## 2017-05-20 DIAGNOSIS — I5023 Acute on chronic systolic (congestive) heart failure: Secondary | ICD-10-CM | POA: Diagnosis not present

## 2017-05-20 DIAGNOSIS — I110 Hypertensive heart disease with heart failure: Secondary | ICD-10-CM | POA: Diagnosis not present

## 2017-05-20 DIAGNOSIS — I712 Thoracic aortic aneurysm, without rupture: Secondary | ICD-10-CM | POA: Diagnosis not present

## 2017-05-20 DIAGNOSIS — I482 Chronic atrial fibrillation: Secondary | ICD-10-CM | POA: Diagnosis not present

## 2017-05-22 ENCOUNTER — Ambulatory Visit (INDEPENDENT_AMBULATORY_CARE_PROVIDER_SITE_OTHER): Payer: Medicare Other | Admitting: Cardiovascular Disease

## 2017-05-22 ENCOUNTER — Encounter: Payer: Self-pay | Admitting: Cardiovascular Disease

## 2017-05-22 VITALS — BP 120/54 | HR 61 | Ht 65.0 in | Wt 161.0 lb

## 2017-05-22 DIAGNOSIS — Z9289 Personal history of other medical treatment: Secondary | ICD-10-CM

## 2017-05-22 DIAGNOSIS — I482 Chronic atrial fibrillation, unspecified: Secondary | ICD-10-CM

## 2017-05-22 DIAGNOSIS — I7121 Aneurysm of the ascending aorta, without rupture: Secondary | ICD-10-CM

## 2017-05-22 DIAGNOSIS — I5181 Takotsubo syndrome: Secondary | ICD-10-CM | POA: Diagnosis not present

## 2017-05-22 DIAGNOSIS — I5022 Chronic systolic (congestive) heart failure: Secondary | ICD-10-CM | POA: Diagnosis not present

## 2017-05-22 DIAGNOSIS — I712 Thoracic aortic aneurysm, without rupture: Secondary | ICD-10-CM

## 2017-05-22 DIAGNOSIS — I351 Nonrheumatic aortic (valve) insufficiency: Secondary | ICD-10-CM | POA: Diagnosis not present

## 2017-05-22 NOTE — Patient Instructions (Signed)
Your physician recommends that you schedule a follow-up appointment in: 3 months with [facility]    Your physician recommends that you continue on your current medications as directed. Please refer to the Current Medication list given to you today.   If you need a refill on your cardiac medications before your next appointment, please call your pharmacy.   No lab work or tests ordered today.     Thank you for choosing [facility] !

## 2017-05-22 NOTE — Progress Notes (Signed)
SUBJECTIVE: The patient presents for follow-up of chronic systolic heart failure. She was hospitalized for this in the past few months. I reviewed all documentation, labs, studies.  Coronary angiography on 03/18/17 demonstrated mild nonobstructive disease.  Echocardiogram 03/17/17 demonstrated moderately reduced left ventricular systolic function, LVEF 69-67%. There was mild to moderate aortic regurgitation and mild to moderate mitral regurgitation. There was moderate tricuspid regurgitation as well.  It was felt she had a stress-induced cardiomyopathy. She also has chronic atrial fibrillation and is not on anticoagulation secondary to history of GI bleeding with iron deficiency anemia. She has a history of TIA and takes Plavix. She is intolerant of aspirin.  She is doing well. She has significant bilateral venous varicosities and has some chronic lower extremity swelling from that. She denies chest pain and shortness of breath. She uses a walker. She is here with her daughter. Her appetite is fairly good. Her daughter says she has been more energetic over the past 2 weeks.      Review of Systems: As per "subjective", otherwise negative.  Allergies  Allergen Reactions  . Codeine Diarrhea and Nausea Only  . Tape Other (See Comments)    SKIN IS VERY THIN AND TEARS AND BRUISES EASILY; PLEASE USE COBAN WRAP!!  . Diona Fanti [Aspirin] Rash and Other (See Comments)    Petechia   . Iron Nausea And Vomiting    Oral iron causes nausea and vomiting  . Penicillins Rash    Has patient had a PCN reaction causing immediate rash, facial/tongue/throat swelling, SOB or lightheadedness with hypotension: Yes Has patient had a PCN reaction causing severe rash involving mucus membranes or skin necrosis: Unknown Has patient had a PCN reaction that required hospitalization: No Has patient had a PCN reaction occurring within the last 10 years: No If all of the above answers are "NO", then may proceed with  Cephalosporin use.     Current Outpatient Prescriptions  Medication Sig Dispense Refill  . albuterol (PROAIR HFA) 108 (90 Base) MCG/ACT inhaler Inhale 1-2 puffs into the lungs every 6 (six) hours as needed for wheezing or shortness of breath.    . carvedilol (COREG) 3.125 MG tablet Take 1 tablet (3.125 mg total) by mouth 2 (two) times daily with a meal. 180 tablet 3  . clopidogrel (PLAVIX) 75 MG tablet Take 75 mg by mouth daily with breakfast.    . cyanocobalamin ([employer]S VITAMIN B12) 2000 MCG tablet Take 1 tablet (2,000 mcg total) by mouth daily. 30 tablet 11  . erythromycin ophthalmic ointment 1 application as needed (for eye infection).     . folic acid (FOLVITE) 1 MG tablet Take 1 tablet (1 mg total) by mouth daily. 30 tablet 11  . furosemide (LASIX) 20 MG tablet Take 1 tablet (20 mg total) by mouth 2 (two) times daily. (Patient taking differently: Take 20 mg by mouth daily. ) 60 tablet 0  . gabapentin (NEURONTIN) 100 MG capsule TAKE 2 CAPSULES BY MOUTH 3  TIMES DAILY (Patient taking differently: Take 200 mg by mouth three times a day) 540 capsule 1  . hydrocortisone (ANUSOL-HC) 25 MG suppository Place 1 suppository (25 mg total) rectally every 12 (twelve) hours. (Patient taking differently: Place 25 mg rectally 2 (two) times daily. ) 12 suppository 1  . ondansetron (ZOFRAN) 4 MG tablet Take 1 tablet (4 mg total) by mouth every 8 (eight) hours as needed for nausea or vomiting. 90 tablet 1  . pantoprazole (PROTONIX) 40 MG tablet Take 1 tablet (  40 mg total) by mouth 2 (two) times daily. 60 tablet 11  . potassium chloride SA (K-DUR,KLOR-CON) 20 MEQ tablet Take 1 tablet (20 mEq total) by mouth daily. 90 tablet 3  . prednisoLONE acetate (PRED FORTE) 1 % ophthalmic suspension Place 1 drop into both eyes 4 (four) times daily.     . psyllium (METAMUCIL) 58.6 % packet Take 1 packet by mouth daily as needed (for constipation).     . sodium chloride (MURO 128) 2 % ophthalmic solution Place 1 drop into both  eyes 3 (three) times daily.     . traMADol-acetaminophen (ULTRACET) 37.5-325 MG tablet Take 1 tablet by mouth every 4 (four) hours as needed for moderate pain or severe pain. #180/ 30 days 180 tablet 0   No current facility-administered medications for this visit.     Past Medical History:  Diagnosis Date  . Aneurysm of aorta (HCC)   . Aneurysm, thoracic aortic ([place])   . Aortic insufficiency   . Ascending aortic aneurysm ([place])    CT in 2012 - 4.2x4.2cm  . Atrial fibrillation ([place])   . Bladder infection    h/o  . Coronary artery disease   . 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 Dehydration    HISTORY   . Fall at home 09/10/12  . Hemorrhoids   . Hip fracture ([place])    hip surgery 2001  . History of blood clots    in leg  . History of knee surgery   . Hypertension   . IDA (iron deficiency anemia) 06/07/2013  . Iron deficiency anemia, unspecified 03/14/2013   secondary to GI blood loss  . Kidney infection    h/o  . Melanoma of skin ([place]) 03/14/2013  . Melanoma of thigh ([place])    left  . Mini stroke ([place])   . S/P colonoscopy March 2010   RMR: friable anal canal hemorrhoids, hyperplastic ascending polyp, adenomatous descending polyp   . Stomach ulcer    secondary to h.pylori, s/p treatment  . Thyroid condition   . Venous stasis    edema    Past Surgical History:  Procedure Laterality Date  . APPENDECTOMY  1942  . BREAST LUMPECTOMY  1998  . CARDIAC CATHETERIZATION    . CATARACT EXTRACTION, BILATERAL    . CHOLECYSTECTOMY    . COLONOSCOPY  06/05/11   pancolonic diverticulosis/ileal reosion/abnormal anorectal junction s/p biopsy: path for small intestine and Ti was benign with non-villous atrophy, rectal biopsy with prominent prolapse changes, no acute inflammation  . CORNEAL TRANSPLANT Bilateral 2013  . ENTEROSCOPY N/A 06/13/2013   [facility] Gastritis/GI BLEED MOST LIKELY DUE TO DUODENAL ULCERS, AND ? AVMs  . ESOPHAGOGASTRODUODENOSCOPY  06/05/11    small hiatal hernia; + H.PYLORI GASTRITIS, s/p 5 days of Pylera, unable to finish due to N/V  . ESOPHAGOGASTRODUODENOSCOPY N/A 06/13/2013   Dr. Oneida Alar- see enteroscopy  . ESOPHAGOGASTRODUODENOSCOPY (EGD) WITH ESOPHAGEAL DILATION N/A 06/08/2013   JSE:GBTDV hiatal hernia;  otherwise normal EGD s/p dilation  . GIVENS CAPSULE STUDY N/A 06/08/2013   Procedure: GIVENS CAPSULE STUDY;  Surgeon: Daneil Dolin, MD;  [facility];  Service: Endoscopy;  Laterality: N/A;  . HEMORRHOID BANDING    . KNEE SURGERY Right 05/2006   total right  . LEFT HEART CATH AND CORONARY ANGIOGRAPHY N/A 03/18/2017   Procedure: LEFT HEART CATH AND CORONARY ANGIOGRAPHY;  Surgeon: Wellington Hampshire, MD;  Location: [place] [facility] LAB;  Service: Cardiovascular;  Laterality:  N/A;  . LEG SURGERY    . MELANOMA EXCISION Left 08/2006   left leg excision  . NM MYOCAR PERF WALL MOTION  03/27/2010   dipyridamole; small reversible basal to mid-septal defect (?artifact), post-stress EF 55%, low risk scan   . PARTIAL HYSTERECTOMY  1976  . TONSILLECTOMY    . TOTAL HIP ARTHROPLASTY Left 2001&2004    X 2 FOR LEFT HIP  . TRANSTHORACIC ECHOCARDIOGRAM  10/06/2012   EF 55-60%, mod eccentric hypertrophy, grade 2 diastolic dysfunction; mildly calcifed AV annulus with moderate regurg; aortic root mildly dilated; LA severely dailted; PA peak pressure 72mHg  . VARICOSE VEIN SURGERY      Social History   Social History  . Marital status: Widowed    Spouse name: N/A  . Number of children: 5  . Years of education: 6   Occupational History  .  Retired   Social History Main Topics  . Smoking status: Former Smoker    Packs/day: 1.50    Years: 20.00    Types: Cigarettes    Quit date: 07/05/1975  . Smokeless tobacco: Never Used     Comment: Quit smoking in 1975  . Alcohol use No  . Drug use: No  . Sexual activity: No   Other Topics Concern  . Not on file   Social History Narrative  . No narrative on file     Vitals:    05/22/17 0841  BP: (!) 120/54  Pulse: 61  SpO2: 94%  Weight: 161 lb (73 kg)  Height: 5\' 5"  (1.651 m)    Wt Readings from Last 3 Encounters:  05/22/17 161 lb (73 kg)  04/21/17 160 lb (72.6 kg)  04/13/17 159 lb (72.1 kg)     PHYSICAL EXAM General: NAD HEENT: Normal. Neck: No JVD, no thyromegaly. Lungs: Clear to auscultation bilaterally with normal respiratory effort. CV: Regular rate and irregular rhythm, normal S1/S2, no S3, 2/6 systolic murmur along left sternal border and 2/4 holodiastolic murmur along left sternal border. Bilateral venous varicosities with chronic lower extremity edema.     Abdomen: Soft, nontender, no distention.  Neurologic: Alert and oriented.  Psych: Normal affect. Skin: Normal. Musculoskeletal: No gross deformities.    ECG: Most recent ECG reviewed.   Labs:    Lipids: Lab Results  Component Value Date/Time   LDLCALC 119 (H) 03/17/2017 01:50 AM   CHOL 189 03/17/2017 01:50 AM   TRIG 73 03/17/2017 01:50 AM   HDL 55 03/17/2017 01:50 AM       ASSESSMENT AND PLAN: 1. Chronic systolic heart failure/stress induced cardiomyopathy: Symptomatically stable. Continue carvedilol and Lasix 20 mg daily.  2. Aortic root dilatation/thoracic aortic aneurysm with mild to moderate aortic regurgitation: Given her age she does not want any interventions performed if one is needed in the future. At this time, no further testing is warranted.  3. Chronic atrial fibrillation: Not an anticoagulation candidate due to prior GI bleeding. Takes Plavix for history of TIA and aspirin intolerance. Symptomatically stable on carvedilol. No changes to therapy.      Disposition: Follow up 3 months.   Kate Sable, M.D., F.A.C.C.

## 2017-05-28 DIAGNOSIS — H3530 Unspecified macular degeneration: Secondary | ICD-10-CM | POA: Diagnosis not present

## 2017-06-02 DIAGNOSIS — I110 Hypertensive heart disease with heart failure: Secondary | ICD-10-CM | POA: Diagnosis not present

## 2017-06-02 DIAGNOSIS — I5023 Acute on chronic systolic (congestive) heart failure: Secondary | ICD-10-CM | POA: Diagnosis not present

## 2017-06-02 DIAGNOSIS — I2510 Atherosclerotic heart disease of native coronary artery without angina pectoris: Secondary | ICD-10-CM | POA: Diagnosis not present

## 2017-06-02 DIAGNOSIS — I712 Thoracic aortic aneurysm, without rupture: Secondary | ICD-10-CM | POA: Diagnosis not present

## 2017-06-02 DIAGNOSIS — I482 Chronic atrial fibrillation: Secondary | ICD-10-CM | POA: Diagnosis not present

## 2017-06-02 DIAGNOSIS — I351 Nonrheumatic aortic (valve) insufficiency: Secondary | ICD-10-CM | POA: Diagnosis not present

## 2017-06-03 ENCOUNTER — Other Ambulatory Visit: Payer: Self-pay | Admitting: Family Medicine

## 2017-06-03 MED ORDER — FUROSEMIDE 20 MG PO TABS
20.0000 mg | ORAL_TABLET | Freq: Two times a day (BID) | ORAL | 1 refills | Status: DC
Start: 2017-06-03 — End: 2017-12-02

## 2017-06-08 DIAGNOSIS — S0501XA Injury of conjunctiva and corneal abrasion without foreign body, right eye, initial encounter: Secondary | ICD-10-CM | POA: Diagnosis not present

## 2017-06-10 DIAGNOSIS — S0501XD Injury of conjunctiva and corneal abrasion without foreign body, right eye, subsequent encounter: Secondary | ICD-10-CM | POA: Diagnosis not present

## 2017-06-13 ENCOUNTER — Other Ambulatory Visit: Payer: Self-pay | Admitting: Family Medicine

## 2017-06-15 NOTE — Telephone Encounter (Signed)
Medication called to pharmacy. 

## 2017-06-15 NOTE — Telephone Encounter (Signed)
ok 

## 2017-06-15 NOTE — Telephone Encounter (Signed)
Ok to refill 

## 2017-06-27 ENCOUNTER — Other Ambulatory Visit: Payer: Self-pay | Admitting: Family Medicine

## 2017-06-29 NOTE — Telephone Encounter (Signed)
Medication refilled per protocol. 

## 2017-07-03 ENCOUNTER — Telehealth: Payer: Self-pay | Admitting: Family Medicine

## 2017-07-03 NOTE — Telephone Encounter (Signed)
Daughter calling.  Mother's varicose veins are itching.  Wants to know if cream ordered for her umbilicus would help and can they get RX.  That was Lotrisone cream.  Told her that was an antifungal and probably not do her legs any good.  Recommended some OTC antibiotic cream, lightly to itchy areas.  If does not help or improve call back next week.

## 2017-07-03 NOTE — Telephone Encounter (Signed)
She can use topical 1% hydrocortisone OTC as well

## 2017-07-06 ENCOUNTER — Emergency Department (HOSPITAL_COMMUNITY): Payer: Medicare Other

## 2017-07-06 ENCOUNTER — Other Ambulatory Visit: Payer: Self-pay

## 2017-07-06 ENCOUNTER — Emergency Department (HOSPITAL_COMMUNITY)
Admission: EM | Admit: 2017-07-06 | Discharge: 2017-07-06 | Disposition: A | Discharge status: Home / Self Care | Payer: Medicare Other | Attending: Emergency Medicine | Admitting: Emergency Medicine

## 2017-07-06 ENCOUNTER — Encounter (HOSPITAL_COMMUNITY): Payer: Self-pay | Admitting: Emergency Medicine

## 2017-07-06 DIAGNOSIS — I110 Hypertensive heart disease with heart failure: Secondary | ICD-10-CM | POA: Diagnosis not present

## 2017-07-06 DIAGNOSIS — M5489 Other dorsalgia: Secondary | ICD-10-CM | POA: Diagnosis not present

## 2017-07-06 DIAGNOSIS — Z79899 Other long term (current) drug therapy: Secondary | ICD-10-CM | POA: Insufficient documentation

## 2017-07-06 DIAGNOSIS — Z87891 Personal history of nicotine dependence: Secondary | ICD-10-CM | POA: Diagnosis not present

## 2017-07-06 DIAGNOSIS — I4891 Unspecified atrial fibrillation: Secondary | ICD-10-CM | POA: Diagnosis not present

## 2017-07-06 DIAGNOSIS — M79604 Pain in right leg: Secondary | ICD-10-CM | POA: Diagnosis not present

## 2017-07-06 DIAGNOSIS — Z7901 Long term (current) use of anticoagulants: Secondary | ICD-10-CM | POA: Insufficient documentation

## 2017-07-06 DIAGNOSIS — M150 Primary generalized (osteo)arthritis: Secondary | ICD-10-CM

## 2017-07-06 DIAGNOSIS — M1991 Primary osteoarthritis, unspecified site: Secondary | ICD-10-CM | POA: Diagnosis not present

## 2017-07-06 DIAGNOSIS — Z8673 Personal history of transient ischemic attack (TIA), and cerebral infarction without residual deficits: Secondary | ICD-10-CM | POA: Diagnosis not present

## 2017-07-06 DIAGNOSIS — R52 Pain, unspecified: Secondary | ICD-10-CM

## 2017-07-06 DIAGNOSIS — M1611 Unilateral primary osteoarthritis, right hip: Secondary | ICD-10-CM | POA: Diagnosis not present

## 2017-07-06 DIAGNOSIS — M1711 Unilateral primary osteoarthritis, right knee: Secondary | ICD-10-CM | POA: Diagnosis not present

## 2017-07-06 DIAGNOSIS — I5022 Chronic systolic (congestive) heart failure: Secondary | ICD-10-CM | POA: Diagnosis not present

## 2017-07-06 DIAGNOSIS — M25551 Pain in right hip: Secondary | ICD-10-CM | POA: Diagnosis present

## 2017-07-06 DIAGNOSIS — M159 Polyosteoarthritis, unspecified: Secondary | ICD-10-CM

## 2017-07-06 MED ORDER — TRAMADOL HCL 50 MG PO TABS
50.0000 mg | ORAL_TABLET | Freq: Once | ORAL | Status: AC
  Administered 2017-07-06: 50 mg via ORAL
  Filled 2017-07-06: qty 1

## 2017-07-06 MED ORDER — ACETAMINOPHEN 325 MG PO TABS
650.0000 mg | ORAL_TABLET | Freq: Once | ORAL | Status: AC
  Administered 2017-07-06: 650 mg via ORAL
  Filled 2017-07-06: qty 2

## 2017-07-06 NOTE — ED Triage Notes (Signed)
Denies fall. Complaining of right hip pain. Pt was ambulatory with Rolator.

## 2017-07-06 NOTE — ED Notes (Signed)
Chronic right hip pain. Unable to have surgery. Pt states normally tramadol helps but today it didn't

## 2017-07-06 NOTE — ED Provider Notes (Signed)
[facility] EMERGENCY DEPARTMENT Provider Note   CSN: [id number] Arrival date & time: 07/06/17  1551     History   Chief Complaint Chief Complaint  Patient presents with  . Hip Pain    HPI [name] [name] is a 83 y.o. female.  HPI Complains of right hip pain radiating to right knee onset yesterday.  Pain is worse with attempting to walk.  Treated with tramadol with partial relief.  No fever no trauma no loss of bowel control.  She is chronically incontinent of urine no injury.  No other associated symptoms Past Medical History:  Diagnosis Date  . Aneurysm of aorta (HCC)   . Aneurysm, thoracic aortic ([place])   . Aortic insufficiency   . Ascending aortic aneurysm ([place])    CT in 2012 - 4.2x4.2cm  . Atrial fibrillation ([place])   . Bladder infection    h/o  . Coronary artery disease   . 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 Dehydration    HISTORY   . Fall at home 09/10/12  . Hemorrhoids   . Hip fracture ([place])    hip surgery 2001  . History of blood clots    in leg  . History of knee surgery   . Hypertension   . IDA (iron deficiency anemia) 06/07/2013  . Iron deficiency anemia, unspecified 03/14/2013   secondary to GI blood loss  . Kidney infection    h/o  . Melanoma of skin ([place]) 03/14/2013  . Melanoma of thigh ([place])    left  . Mini stroke ([place])   . S/P colonoscopy March 2010   RMR: friable anal canal hemorrhoids, hyperplastic ascending polyp, adenomatous descending polyp   . Stomach ulcer    secondary to h.pylori, s/p treatment  . Thyroid condition   . Venous stasis    edema    Patient Active Problem List   Diagnosis Date Noted  . Heart failure ([place]) 04/04/2017  . Acute on chronic systolic heart failure ([place]) 04/02/2017  . Coronary artery disease 04/02/2017  . Hypertension 04/02/2017  . Stress-induced cardiomyopathy 03/19/2017  . Non-ST elevation (NSTEMI) myocardial infarction ([place])   . Chest pain 03/16/2017  . Atrial  fibrillation, chronic ([place]) 03/16/2017  . 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 Vitamin B12 deficiency 01/26/2017  . Dysphagia 05/20/2016  . Hypokalemia 05/19/2016  . Thrombocytopenia ([place]) 05/19/2016  . Acute respiratory failure with hypoxia ([place]) 05/18/2016  . Acute bronchitis 05/18/2016  . Bilateral lower extremity edema 05/18/2016  . Degenerative joint disease involving multiple joints 05/18/2016  . Pyuria 05/18/2016  . DNR (do not resuscitate) 05/18/2016  . Constipation 02/09/2015  . Occult GI bleeding 08/23/2013  . IDA (iron deficiency anemia) 06/07/2013  . Melanoma of skin ([place]) 03/14/2013  . DCIS (ductal carcinoma in situ) of breast 03/14/2013  . BPPV (benign paroxysmal positional vertigo) 02/10/2013  . RBBB 02/10/2013  . Ascending aortic aneurysm ([place]) 02/10/2013  . Aortic insufficiency 02/10/2013  . S/P total knee replacement 08/14/2011  . Helicobacter pylori gastritis 07/14/2011  . Anemia 02/04/2011  . Bladder infection   . BURSITIS, HIP 05/29/2009    Past Surgical History:  Procedure Laterality Date  . APPENDECTOMY  1942  . BREAST LUMPECTOMY  1998  . CARDIAC CATHETERIZATION    . CATARACT EXTRACTION, BILATERAL    . CHOLECYSTECTOMY    . COLONOSCOPY  06/05/11   pancolonic diverticulosis/ileal reosion/abnormal anorectal junction s/p biopsy: path for small intestine and Ti was benign with non-villous  atrophy, rectal biopsy with prominent prolapse changes, no acute inflammation  . CORNEAL TRANSPLANT Bilateral 2013  . ENTEROSCOPY N/A 06/13/2013   QDI:YMEB Gastritis/GI BLEED MOST LIKELY DUE TO DUODENAL ULCERS, AND ? AVMs  . ESOPHAGOGASTRODUODENOSCOPY  06/05/11   small hiatal hernia; + H.PYLORI GASTRITIS, s/p 5 days of Pylera, unable to finish due to N/V  . ESOPHAGOGASTRODUODENOSCOPY N/A 06/13/2013   Dr. Oneida Alar- see enteroscopy  . ESOPHAGOGASTRODUODENOSCOPY (EGD) WITH ESOPHAGEAL DILATION N/A 06/08/2013   RAX:ENMMH hiatal hernia;  otherwise normal EGD s/p dilation  .  GIVENS CAPSULE STUDY N/A 06/08/2013   Procedure: GIVENS CAPSULE STUDY;  Surgeon: Daneil Dolin, MD;  Location[facility];  Service: Endoscopy;  Laterality: N/A;  . HEMORRHOID BANDING    . KNEE SURGERY Right 05/2006   total right  . LEFT HEART CATH AND CORONARY ANGIOGRAPHY N/A 03/18/2017   Procedure: LEFT HEART CATH AND CORONARY ANGIOGRAPHY;  Surgeon: Wellington Hampshire, MD;  Location: [place] CV LAB;  Service: Cardiovascular;  Laterality: N/A;  . LEG SURGERY    . MELANOMA EXCISION Left 08/2006   left leg excision  . NM MYOCAR PERF WALL MOTION  03/27/2010   dipyridamole; small reversible basal to mid-septal defect (?artifact), post-stress EF 55%, low risk scan   . PARTIAL HYSTERECTOMY  1976  . TONSILLECTOMY    . TOTAL HIP ARTHROPLASTY Left 2001&2004    X 2 FOR LEFT HIP  . TRANSTHORACIC ECHOCARDIOGRAM  10/06/2012   EF 55-60%, mod eccentric hypertrophy, grade 2 diastolic dysfunction; mildly calcifed AV annulus with moderate regurg; aortic root mildly dilated; LA severely dailted; PA peak pressure 32mHg  . VARICOSE VEIN SURGERY      OB History    No data available       Home Medications    Prior to Admission medications   Medication Sig Start Date End Date Taking? Authorizing Provider  albuterol (PROAIR HFA) 108 (90 Base) MCG/ACT inhaler Inhale 1-2 puffs into the lungs every 6 (six) hours as needed for wheezing or shortness of breath.   Yes [provider]  carvedilol (COREG) 3.125 MG tablet Take 1 tablet (3.125 mg total) by mouth 2 (two) times daily with a meal. 04/21/17  Yes [name], MD  clopidogrel (PLAVIX) 75 MG tablet Take 75 mg by mouth daily with breakfast.   Yes [provider]  cyanocobalamin ([employer] VITAMIN B12) 2000 MCG tablet Take 1 tablet (2,000 mcg total) by mouth daily. 01/26/17  Yes Baird Cancer, PA-C  folic acid (FOLVITE) 1 MG tablet Take 1 tablet (1 mg total) by mouth daily. 01/26/17  Yes Kefalas, Manon Hilding, PA-C  furosemide (LASIX) 20 MG  tablet Take 1 tablet (20 mg total) by mouth 2 (two) times daily. Patient taking differently: Take 20 mg by mouth daily.  06/03/17  Yes [name], MD  gabapentin (NEURONTIN) 100 MG capsule TAKE 2 CAPSULES BY MOUTH 3  TIMES DAILY Patient taking differently: Take 200 mg by mouth three times a day 08/05/16  Yes Carole Civil, MD  hydrocortisone (ANUSOL-HC) 25 MG suppository Place 1 suppository (25 mg total) rectally every 12 (twelve) hours. Patient taking differently: Place 25 mg rectally 2 (two) times daily.  05/16/15  Yes Annitta Needs, NP  ondansetron (ZOFRAN) 4 MG tablet TAKE 1 TABLET BY MOUTH  EVERY 8 HOURS AS NEEDED FOR NAUSEA AND VOMITING Patient taking differently: TAKE 1 TABLET BY MOUTH DAILY 06/29/17  Yes [name], MD  pantoprazole (PROTONIX) 40 MG tablet Take  1 tablet (40 mg total) by mouth 2 (two) times daily. 02/09/15  Yes Annitta Needs, NP  potassium chloride SA (K-DUR,KLOR-CON) 20 MEQ tablet Take 1 tablet (20 mEq total) by mouth daily. 04/29/17  Yes [name], MD  prednisoLONE acetate (PRED FORTE) 1 % ophthalmic suspension Place 1 drop into both eyes 4 (four) times daily.    Yes [provider]  psyllium (METAMUCIL) 58.6 % packet Take 1 packet by mouth daily as needed (for constipation).    Yes [provider]  sodium chloride (MURO 128) 2 % ophthalmic solution Place 1 drop into both eyes 3 (three) times daily.    Yes [provider]  traMADol-acetaminophen (ULTRACET) 37.5-325 MG tablet TAKE 1 TABLET BY MOUTH EVERY 4 HOURS 06/15/17  Yes [name], MD    Family History Family History  Problem Relation Age of Onset  . Breast cancer Daughter        also hyperlipidemia  . Breast cancer Daughter        also hyperlipidemia  . Asthma Mother   . Multiple sclerosis Child   . Hyperlipidemia Child   . Colon cancer Neg Hx     Social History Social History   Tobacco Use  . Smoking status: Former Smoker    Packs/day: 1.50     Years: 20.00    Pack years: 30.00    Types: Cigarettes    Last attempt to quit: 07/05/1975    Years since quitting: 42.0  . Smokeless tobacco: Never Used  . Tobacco comment: Quit smoking in 1975  Substance Use Topics  . Alcohol use: No  . Drug use: No     Allergies   Codeine; Tape; Asa [aspirin]; Iron; and Penicillins   Review of Systems Review of Systems  Constitutional: Negative.   HENT: Negative.   Respiratory: Negative.   Cardiovascular: Negative.   Gastrointestinal: Negative.   Musculoskeletal: Positive for arthralgias and gait problem.       Walks with walker  Skin: Negative.   Psychiatric/Behavioral: Negative.   All other systems reviewed and are negative.    Physical Exam Updated Vital Signs BP (!) 141/60 (BP Location: Right Arm)   Pulse 60   Temp 98.1 F (36.7 C) (Oral)   Resp 18   SpO2 93%   Physical Exam  Constitutional: She appears well-developed and well-nourished. No distress.  HENT:  Head: Normocephalic and atraumatic.  Eyes: Conjunctivae are normal. Pupils are equal, round, and reactive to light.  Neck: Neck supple. No tracheal deviation present. No thyromegaly present.  Cardiovascular: Normal rate.  No murmur heard. Pulmonary/Chest: Effort normal and breath sounds normal.  Abdominal: Soft. Bowel sounds are normal. She exhibits no distension. There is no tenderness.  Musculoskeletal: Normal range of motion. She exhibits no edema or tenderness.  Prior spine is nontender.  She has pain at her right hip when she sits up from a supine position.  Right lower extremity surgical scar over knee.  Hip thigh and knee are nontender.  She has no pain on internal or external rotation 5.  DP pulses 2+ bilaterally.  No edema.  Neurological: She is alert. Coordination normal.  Skin: Skin is warm and dry. No rash noted.  Psychiatric: She has a normal mood and affect.  Nursing note and vitals reviewed.    ED Treatments / Results  Labs (all labs ordered are  listed, but only abnormal results are displayed) Labs Reviewed - No data to display  EKG  EKG Interpretation  None     X-rays reviewed by me Results for orders placed or performed in visit on 66/06/00  BASIC METABOLIC PANEL WITH GFR  Result Value Ref Range   Glucose, Bld 94 65 - 99 mg/dL   BUN 33 (H) 7 - 25 mg/dL   Creat 1.79 (H) 0.60 - 0.88 mg/dL   GFR, Est Non African American 24 (L) > OR = 60 mL/min/1.36m2   GFR, Est African American 28 (L) > OR = 60 mL/min/1.54m2   BUN/Creatinine Ratio 18 6 - 22 (calc)   Sodium 140 135 - 146 mmol/L   Potassium 4.5 3.5 - 5.3 mmol/L   Chloride 106 98 - 110 mmol/L   CO2 24 20 - 32 mmol/L   Calcium 8.6 8.6 - 10.4 mg/dL   Dg Lumbar Spine Complete  Result Date: 07/06/2017 CLINICAL DATA:  Severe RIGHT hip pain, unable to have surgery EXAM: LUMBAR SPINE - COMPLETE 4+ VIEW COMPARISON:  09/19/2016 FINDINGS: Osseous demineralization. Five non-rib-bearing lumbar vertebra. Multilevel facet degenerative changes lower lumbar spine. Disc space narrowing at L4-L5 and L5-S1. Vertebral body heights maintained without fracture or bone destruction. Minimal chronic anterolisthesis at L5-S1. SI joints grossly preserved. Dense of atherosclerotic calcifications aorta. LEFT hip prosthesis with advanced osteoarthritic changes RIGHT hip. IMPRESSION: Marked osseous demineralization with degenerative disc and facet disease changes of the lower lumbar spine. No acute abnormalities. Electronically Signed   By: Lavonia Dana M.D.   On: 07/06/2017 18:10   Dg Hip Unilat With Pelvis 2-3 Views Right  Result Date: 07/06/2017 CLINICAL DATA:  Severe RIGHT hip pain, unable to have surgery EXAM: DG HIP (WITH OR WITHOUT PELVIS) 2-3V RIGHT COMPARISON:  01/08/2015 FINDINGS: Marked osseous demineralization. LEFT hip prosthesis, distal aspect of femoral component incompletely visualized. Joint space narrowing and spur formation at RIGHT hip joint compatible with progressive degenerative changes  since 2016. Multilevel degenerative disc and facet disease changes of visualized lumbar spine. No acute fracture, dislocation, or bone destruction. IMPRESSION: Progressive osteoarthritic changes RIGHT hip since previous exam. Osseous demineralization with note of a LEFT hip prosthesis. Multilevel degenerative disc and facet disease changes at visualized lumbar spine. Electronically Signed   By: Lavonia Dana M.D.   On: 07/06/2017 18:12    Radiology No results found.  Procedures Procedures (including critical care time)  Medications Ordered in ED Medications - No data to display 8:40 PM she continues to complain of pain after treatment with Tylenol and tramadol.  She is able to walk with a walker without difficulty.  Plan  Initial Impression / Assessment and Plan / ED Course  I have reviewed the triage vital signs and the nursing notes.  Pertinent labs & imaging results that were available during my care of the patient were reviewed by me and considered in my medical decision making (see chart for details).     Continue tramadol as prescribed.  Tylenol every 4 hours as needed for pain. Follow-up with Dr. Dennard Schaumann or Dr. Aline Brochure if pain is not well controlled Final Clinical Impressions(s) / ED Diagnoses  Diagnosis degenerative joint disease Final diagnoses:  Pain    ED Discharge Orders    None       Orlie Dakin, MD 07/06/17 2048

## 2017-07-06 NOTE — Discharge Instructions (Signed)
Use tramadol as prescribed.  You can also take Tylenol every 4 hours as needed for pain.  Call Dr. Dennard Schaumann or Dr. Aline Brochure to schedule follow-up appointment if pain is not well controlled

## 2017-07-07 ENCOUNTER — Telehealth: Payer: Self-pay | Admitting: Radiology

## 2017-07-07 NOTE — Telephone Encounter (Signed)
Patients daughter called today wanting to make appt she was in ER last pm, in a lot of pain,tearful. Wants to schedule ASAP with Dr Aline Brochure call [phone]

## 2017-07-07 NOTE — Telephone Encounter (Signed)
Spoke with patient's daughter, Joaquim Lai, per voice message; discussed availability with Dr Aline Brochure; daughter states patient does also have appointment with primary care, Dr Dennard Schaumann, tomorrow.  Also scheduled for next available with Dr Aline Brochure; aware.

## 2017-07-09 ENCOUNTER — Ambulatory Visit (HOSPITAL_COMMUNITY)
Admission: RE | Admit: 2017-07-09 | Discharge: 2017-07-09 | Disposition: A | Discharge status: Home / Self Care | Payer: Medicare Other | Source: Ambulatory Visit | Attending: Family Medicine | Admitting: Family Medicine

## 2017-07-09 ENCOUNTER — Encounter: Payer: Self-pay | Admitting: Family Medicine

## 2017-07-09 ENCOUNTER — Ambulatory Visit (INDEPENDENT_AMBULATORY_CARE_PROVIDER_SITE_OTHER): Payer: Medicare Other | Admitting: Family Medicine

## 2017-07-09 VITALS — BP 126/70 | HR 66 | Temp 98.2°F | Resp 14 | Ht 65.0 in | Wt 162.0 lb

## 2017-07-09 DIAGNOSIS — Z96642 Presence of left artificial hip joint: Secondary | ICD-10-CM | POA: Insufficient documentation

## 2017-07-09 DIAGNOSIS — M85851 Other specified disorders of bone density and structure, right thigh: Secondary | ICD-10-CM | POA: Insufficient documentation

## 2017-07-09 DIAGNOSIS — M1611 Unilateral primary osteoarthritis, right hip: Secondary | ICD-10-CM | POA: Diagnosis not present

## 2017-07-09 DIAGNOSIS — M25551 Pain in right hip: Secondary | ICD-10-CM | POA: Diagnosis not present

## 2017-07-09 DIAGNOSIS — R531 Weakness: Secondary | ICD-10-CM | POA: Diagnosis not present

## 2017-07-09 DIAGNOSIS — R262 Difficulty in walking, not elsewhere classified: Secondary | ICD-10-CM

## 2017-07-09 MED ORDER — PREDNISONE 20 MG PO TABS: ORAL_TABLET | ORAL | 0 refills | Status: DC

## 2017-07-09 NOTE — Progress Notes (Signed)
Subjective:    Patient ID: [name] [name], female    DOB: [birth year], 83 y.o.   MRN: [id number]  HPI Patient was seen in the emergency room on December 3 complaining of right posterior hip pain with muscle spasms radiating from her posterior hip to her knee.  X-ray of the lumbar spine was obtained that did reveal degenerative changes.  Visualized portion of the hip joint appeared significant for osteoarthritis.  Patient denies any recent injuries or falls.  She awoke with the pain.  On examination today, the pain originates behind the greater trochanter.  She complains of a "drawing" spasm of the muscles from her knee to her hip mainly in the quadricep and lateral hamstring whenever she tries to bear weight.  Denies any numbness or tingling in the foot.  She denies any numbness or tingling in her perineum.  She denies any bowel or bladder incontinence.  She has pain with hip flexion.  She has pain with internal rotation.  She has pain with straight leg raise. Past Medical History:  Diagnosis Date  . Aneurysm of aorta (HCC)   . Aneurysm, thoracic aortic ([place])   . Aortic insufficiency   . Ascending aortic aneurysm ([place])    CT in 2012 - 4.2x4.2cm  . Atrial fibrillation ([place])   . Bladder infection    h/o  . Coronary artery disease   . 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 Dehydration    HISTORY   . Fall at home 09/10/12  . Hemorrhoids   . Hip fracture ([place])    hip surgery 2001  . History of blood clots    in leg  . History of knee surgery   . Hypertension   . IDA (iron deficiency anemia) 06/07/2013  . Iron deficiency anemia, unspecified 03/14/2013   secondary to GI blood loss  . Kidney infection    h/o  . Melanoma of skin ([place]) 03/14/2013  . Melanoma of thigh ([place])    left  . Mini stroke ([place])   . S/P colonoscopy March 2010   RMR: friable anal canal hemorrhoids, hyperplastic ascending polyp, adenomatous descending polyp   . Stomach ulcer    secondary  to h.pylori, s/p treatment  . Thyroid condition   . Venous stasis    edema   Past Surgical History:  Procedure Laterality Date  . APPENDECTOMY  1942  . BREAST LUMPECTOMY  1998  . CARDIAC CATHETERIZATION    . CATARACT EXTRACTION, BILATERAL    . CHOLECYSTECTOMY    . COLONOSCOPY  06/05/11   pancolonic diverticulosis/ileal reosion/abnormal anorectal junction s/p biopsy: path for small intestine and Ti was benign with non-villous atrophy, rectal biopsy with prominent prolapse changes, no acute inflammation  . CORNEAL TRANSPLANT Bilateral 2013  . ENTEROSCOPY N/A 06/13/2013   EXN:TZGY Gastritis/GI BLEED MOST LIKELY DUE TO DUODENAL ULCERS, AND ? AVMs  . ESOPHAGOGASTRODUODENOSCOPY  06/05/11   small hiatal hernia; + H.PYLORI GASTRITIS, s/p 5 days of Pylera, unable to finish due to N/V  . ESOPHAGOGASTRODUODENOSCOPY N/A 06/13/2013   Dr. Oneida Alar- see enteroscopy  . ESOPHAGOGASTRODUODENOSCOPY (EGD) WITH ESOPHAGEAL DILATION N/A 06/08/2013   FVC:BSWHQ hiatal hernia;  otherwise normal EGD s/p dilation  . GIVENS CAPSULE STUDY N/A 06/08/2013   Procedure: GIVENS CAPSULE STUDY;  Surgeon: Daneil Dolin, MD;  [facility];  Service: Endoscopy;  Laterality: N/A;  . HEMORRHOID BANDING    . KNEE SURGERY Right 05/2006   total right  .  LEFT HEART CATH AND CORONARY ANGIOGRAPHY N/A 03/18/2017   Procedure: LEFT HEART CATH AND CORONARY ANGIOGRAPHY;  Surgeon: Wellington Hampshire, MD;  Location: [place] CV LAB;  Service: Cardiovascular;  Laterality: N/A;  . LEG SURGERY    . MELANOMA EXCISION Left 08/2006   left leg excision  . NM MYOCAR PERF WALL MOTION  03/27/2010   dipyridamole; small reversible basal to mid-septal defect (?artifact), post-stress EF 55%, low risk scan   . PARTIAL HYSTERECTOMY  1976  . TONSILLECTOMY    . TOTAL HIP ARTHROPLASTY Left 2001&2004    X 2 FOR LEFT HIP  . TRANSTHORACIC ECHOCARDIOGRAM  10/06/2012   EF 55-60%, mod eccentric hypertrophy, grade 2 diastolic dysfunction; mildly  calcifed AV annulus with moderate regurg; aortic root mildly dilated; LA severely dailted; PA peak pressure 63mHg  . VARICOSE VEIN SURGERY     Current Outpatient Medications on File Prior to Visit  Medication Sig Dispense Refill  . albuterol (PROAIR HFA) 108 (90 Base) MCG/ACT inhaler Inhale 1-2 puffs into the lungs every 6 (six) hours as needed for wheezing or shortness of breath.    . carvedilol (COREG) 3.125 MG tablet Take 1 tablet (3.125 mg total) by mouth 2 (two) times daily with a meal. 180 tablet 3  . clopidogrel (PLAVIX) 75 MG tablet Take 75 mg by mouth daily with breakfast.    . cyanocobalamin ([employer]S VITAMIN B12) 2000 MCG tablet Take 1 tablet (2,000 mcg total) by mouth daily. 30 tablet 11  . folic acid (FOLVITE) 1 MG tablet Take 1 tablet (1 mg total) by mouth daily. 30 tablet 11  . furosemide (LASIX) 20 MG tablet Take 1 tablet (20 mg total) by mouth 2 (two) times daily. (Patient taking differently: Take 20 mg by mouth daily. ) 180 tablet 1  . gabapentin (NEURONTIN) 100 MG capsule TAKE 2 CAPSULES BY MOUTH 3  TIMES DAILY (Patient taking differently: Take 200 mg by mouth three times a day) 540 capsule 1  . hydrocortisone (ANUSOL-HC) 25 MG suppository Place 1 suppository (25 mg total) rectally every 12 (twelve) hours. (Patient taking differently: Place 25 mg rectally 2 (two) times daily. ) 12 suppository 1  . ondansetron (ZOFRAN) 4 MG tablet TAKE 1 TABLET BY MOUTH  EVERY 8 HOURS AS NEEDED FOR NAUSEA AND VOMITING (Patient taking differently: TAKE 1 TABLET BY MOUTH DAILY) 60 tablet 0  . pantoprazole (PROTONIX) 40 MG tablet Take 1 tablet (40 mg total) by mouth 2 (two) times daily. 60 tablet 11  . potassium chloride SA (K-DUR,KLOR-CON) 20 MEQ tablet Take 1 tablet (20 mEq total) by mouth daily. 90 tablet 3  . prednisoLONE acetate (PRED FORTE) 1 % ophthalmic suspension Place 1 drop into both eyes 4 (four) times daily.     . psyllium (METAMUCIL) 58.6 % packet Take 1 packet by mouth daily as needed (for  constipation).     . sodium chloride (MURO 128) 2 % ophthalmic solution Place 1 drop into both eyes 3 (three) times daily.     . traMADol-acetaminophen (ULTRACET) 37.5-325 MG tablet TAKE 1 TABLET BY MOUTH EVERY 4 HOURS 180 tablet 0   No current facility-administered medications on file prior to visit.    Allergies  Allergen Reactions  . Codeine Diarrhea and Nausea Only  . Tape Other (See Comments)    SKIN IS VERY THIN AND TEARS AND BRUISES EASILY; PLEASE USE COBAN WRAP!!  . Diona Fanti [Aspirin] Rash and Other (See Comments)    Petechia   . Iron Nausea And Vomiting  Oral iron causes nausea and vomiting  . Penicillins Rash    Has patient had a PCN reaction causing immediate rash, facial/tongue/throat swelling, SOB or lightheadedness with hypotension: Yes Has patient had a PCN reaction causing severe rash involving mucus membranes or skin necrosis: Unknown Has patient had a PCN reaction that required hospitalization: No Has patient had a PCN reaction occurring within the last 10 years: No If all of the above answers are "NO", then may proceed with Cephalosporin use.    Social History   Socioeconomic History  . Marital status: Widowed    Spouse name: Not on file  . Number of children: 5  . Years of education: 6  . Highest education level: Not on file  Social Needs  . Financial resource strain: Not on file  . Food insecurity - worry: Not on file  . Food insecurity - inability: Not on file  . Transportation needs - medical: Not on file  . Transportation needs - non-medical: Not on file  Occupational History    Employer: RETIRED  Tobacco Use  . Smoking status: Former Smoker    Packs/day: 1.50    Years: 20.00    Pack years: 30.00    Types: Cigarettes    Last attempt to quit: 07/05/1975    Years since quitting: 42.0  . Smokeless tobacco: Never Used  . Tobacco comment: Quit smoking in 1975  Substance and Sexual Activity  . Alcohol use: No  . Drug use: No  . Sexual activity: No    Other Topics Concern  . Not on file  Social History Narrative  . Not on file      Review of Systems  All other systems reviewed and are negative.      Objective:   Physical Exam  Cardiovascular: Normal rate, regular rhythm and normal heart sounds.  Pulmonary/Chest: Effort normal and breath sounds normal. No respiratory distress. She has no wheezes. She has no rales.  Abdominal: Soft. Bowel sounds are normal. She exhibits no distension.  Musculoskeletal:       Right hip: She exhibits decreased range of motion, decreased strength and tenderness. She exhibits no bony tenderness, no swelling and no deformity.       Legs: Vitals reviewed.         Assessment & Plan:  Right hip pain - Plan: DG HIP UNILAT WITH PELVIS 2-3 VIEWS RIGHT  Differential diagnosis includes lumbar radiculopathy versus right hip pain secondary to osteoarthritis with compensatory muscle spasms in the quadriceps and hamstrings secondary to pain.  Begin by obtaining dedicated x-rays of the right hip to rule out an occult fracture particularly given the avoidance of weightbearing.  If x-rays show only osteoarthritis in the hip joint, I will start the patient on a prednisone taper pack as this would likely treat osteoarthritis in the hip as well as lumbar radiculopathy.  I am leaning towards this being more likely osteoarthritis in the hip.  If so the patient may benefit from an intra-articular cortisone injection.  I will also consult home health for assistance at home with physical therapy as well as bathing, etc.

## 2017-07-17 ENCOUNTER — Ambulatory Visit: Payer: Medicare Other | Admitting: Orthopedic Surgery

## 2017-07-19 ENCOUNTER — Other Ambulatory Visit: Payer: Self-pay | Admitting: Family Medicine

## 2017-07-20 NOTE — Telephone Encounter (Signed)
Ok to refill 

## 2017-07-20 NOTE — Telephone Encounter (Signed)
ok 

## 2017-07-22 ENCOUNTER — Ambulatory Visit: Payer: Medicare Other | Admitting: Orthopedic Surgery

## 2017-07-23 ENCOUNTER — Other Ambulatory Visit: Payer: Self-pay | Admitting: Family Medicine

## 2017-08-10 ENCOUNTER — Encounter: Payer: Self-pay | Admitting: Orthopedic Surgery

## 2017-08-10 ENCOUNTER — Ambulatory Visit (INDEPENDENT_AMBULATORY_CARE_PROVIDER_SITE_OTHER): Payer: Medicare Other | Admitting: Orthopedic Surgery

## 2017-08-10 VITALS — BP 108/68 | HR 75 | Ht 65.0 in | Wt 162.0 lb

## 2017-08-10 DIAGNOSIS — M545 Low back pain, unspecified: Secondary | ICD-10-CM

## 2017-08-10 DIAGNOSIS — M79604 Pain in right leg: Secondary | ICD-10-CM

## 2017-08-10 DIAGNOSIS — M47816 Spondylosis without myelopathy or radiculopathy, lumbar region: Secondary | ICD-10-CM

## 2017-08-10 DIAGNOSIS — Z96651 Presence of right artificial knee joint: Secondary | ICD-10-CM

## 2017-08-10 NOTE — Progress Notes (Signed)
Progress Note   Patient ID: [name] [name], female   DOB: 04/07/1923, 84 y.o.   MRN: [id number]  Chief Complaint  Patient presents with  . Back Pain  . Hip Pain    right / patient states pain in buttocks down leg right     84 year old female presents for evaluation of she complained of intense dull throbbing aching right lower back pain radiating down to her right foot which was constant severe and present for the last 2 months  She had an ESI injection last year for severe spinal stenosis with good result  She went to the ER and was evaluated there with x-rays of both hips and pelvis as well as lumbar spine.  She has a left total hip replacement which is stable  She has had difficulty walking without a walker and uses that in the house     Review of Systems  Constitutional: Negative for chills, fever and weight loss.  HENT: Positive for hearing loss.   Musculoskeletal: Positive for back pain and myalgias.  Skin: Negative for rash.  Neurological: Positive for focal weakness. Negative for tingling and sensory change.   Current Meds  Medication Sig  . carvedilol (COREG) 3.125 MG tablet Take 1 tablet (3.125 mg total) by mouth 2 (two) times daily with a meal.  . clopidogrel (PLAVIX) 75 MG tablet Take 75 mg by mouth daily with breakfast.  . cyanocobalamin ([employer] VITAMIN B12) 2000 MCG tablet Take 1 tablet (2,000 mcg total) by mouth daily.  . folic acid (FOLVITE) 1 MG tablet Take 1 tablet (1 mg total) by mouth daily.  . furosemide (LASIX) 20 MG tablet Take 1 tablet (20 mg total) by mouth 2 (two) times daily. (Patient taking differently: Take 20 mg by mouth daily. )  . gabapentin (NEURONTIN) 100 MG capsule TAKE 2 CAPSULES BY MOUTH 3  TIMES DAILY (Patient taking differently: Take 200 mg by mouth three times a day)  . hydrocortisone (ANUSOL-HC) 25 MG suppository Place 1 suppository (25 mg total) rectally every 12 (twelve) hours. (Patient taking differently: Place 25 mg rectally 2 (two) times  daily. )  . mupirocin ointment (BACTROBAN) 2 % Place 1 application into the nose 2 (two) times daily.  . ondansetron (ZOFRAN) 4 MG tablet TAKE 1 TABLET BY MOUTH  EVERY 8 HOURS AS NEEDED FOR NAUSEA AND VOMITING  . pantoprazole (PROTONIX) 40 MG tablet Take 1 tablet (40 mg total) by mouth 2 (two) times daily.  . potassium chloride SA (K-DUR,KLOR-CON) 20 MEQ tablet Take 1 tablet (20 mEq total) by mouth daily.  . prednisoLONE acetate (PRED FORTE) 1 % ophthalmic suspension Place 1 drop into both eyes 4 (four) times daily.   . psyllium (METAMUCIL) 58.6 % packet Take 1 packet by mouth daily as needed (for constipation).   . sodium chloride (MURO 128) 2 % ophthalmic solution Place 1 drop into both eyes 3 (three) times daily.   . traMADol-acetaminophen (ULTRACET) 37.5-325 MG tablet TAKE 1 TABLET BY MOUTH EVERY 4 HOURS AS NEEDED FOR PAIN    Past Medical History:  Diagnosis Date  . Aneurysm of aorta (HCC)   . Aneurysm, thoracic aortic ([place])   . Aortic insufficiency   . Ascending aortic aneurysm ([place])    CT in 2012 - 4.2x4.2cm  . Atrial fibrillation ([place])   . Bladder infection    h/o  . Coronary artery disease   . 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 Dehydration  HISTORY   . Fall at home 09/10/12  . Hemorrhoids   . Hip fracture ([place])    hip surgery 2001  . History of blood clots    in leg  . History of knee surgery   . Hypertension   . IDA (iron deficiency anemia) 06/07/2013  . Iron deficiency anemia, unspecified 03/14/2013   secondary to GI blood loss  . Kidney infection    h/o  . Melanoma of skin ([place]) 03/14/2013  . Melanoma of thigh ([place])    left  . Mini stroke ([place])   . S/P colonoscopy March 2010   RMR: friable anal canal hemorrhoids, hyperplastic ascending polyp, adenomatous descending polyp   . Stomach ulcer    secondary to h.pylori, s/p treatment  . Thyroid condition   . Venous stasis    edema     Allergies  Allergen Reactions  . Codeine  Diarrhea and Nausea Only  . Tape Other (See Comments)    SKIN IS VERY THIN AND TEARS AND BRUISES EASILY; PLEASE USE COBAN WRAP!!  . Diona Fanti [Aspirin] Rash and Other (See Comments)    Petechia   . Iron Nausea And Vomiting    Oral iron causes nausea and vomiting  . Penicillins Rash    Has patient had a PCN reaction causing immediate rash, facial/tongue/throat swelling, SOB or lightheadedness with hypotension: Yes Has patient had a PCN reaction causing severe rash involving mucus membranes or skin necrosis: Unknown Has patient had a PCN reaction that required hospitalization: No Has patient had a PCN reaction occurring within the last 10 years: No If all of the above answers are "NO", then may proceed with Cephalosporin use.     BP 108/68   Pulse 75   Ht 5\' 5"  (1.651 m)   Wt 162 lb (73.5 kg)   BMI 26.96 kg/m    Physical Exam  Constitutional: She is oriented to person, place, and time. She appears well-developed and well-nourished.  Musculoskeletal:       Back:       Legs: Neurological: She is alert and oriented to person, place, and time.  Skin: Skin is warm, dry and intact. Capillary refill takes less than 2 seconds. No rash noted. No cyanosis.  Psychiatric: She has a normal mood and affect. Judgment normal.  Vitals reviewed.   Ortho Exam  Neurologic and vascular exam are intact in both lower extremities   Medical decision-making  Imaging: I looked at the pelvis and 2 hips AP and lateral cerclage wiring on the left hip screws in the acetabulum that is stable with mild protrusio  Right hip no fracture joint space narrowing centrally some osteophytes are noted consistent with osteoarthritis  Previous MRI in 2018 shows multilevel degenerative disc disease and spinal stenosis  Encounter Diagnoses  Name Primary?  . Lumbar pain with radiation down right leg Yes  . Lumbar spondylosis   . Hx of total knee replacement, right     Lumbar pain and spondylosis Recommend  epidural series lumbar spine.  She is not a surgical candidate.  Right total hip stable   Arther Abbott, MD 08/10/2017 11:40 AM

## 2017-08-10 NOTE — Patient Instructions (Addendum)
  You will get a call to schedule the injections in [place] at [facility] will call you, if you prefer to call her, the number is [phone]

## 2017-08-11 ENCOUNTER — Encounter: Payer: Self-pay | Admitting: Family Medicine

## 2017-08-11 ENCOUNTER — Ambulatory Visit (INDEPENDENT_AMBULATORY_CARE_PROVIDER_SITE_OTHER): Payer: Medicare Other | Admitting: Family Medicine

## 2017-08-11 VITALS — BP 130/60 | HR 78 | Temp 97.6°F | Resp 16 | Wt 162.0 lb

## 2017-08-11 DIAGNOSIS — I872 Venous insufficiency (chronic) (peripheral): Secondary | ICD-10-CM

## 2017-08-11 MED ORDER — MOMETASONE FUROATE 0.1 % EX CREA: 1.0000 "application " | TOPICAL_CREAM | Freq: Every day | CUTANEOUS | 0 refills | Status: DC

## 2017-08-11 NOTE — Progress Notes (Signed)
Subjective:    Patient ID: [name] [name], female    DOB: 1922-09-07, 84 y.o.   MRN: [id number]  HPI Patient reports a two-week history of an itchy red rash on the anterior surface of both shins. Rash consists of small erythematous papules and macules clustered on the anterior surface of both shins directly under numerous varicose veins. Exam is also significant for mild pitting edema in both ankles also in the area where the rash is located.  He has been putting mupirocin on the rash twice daily for 2 weeks with no benefit. There is no other rash anywhere else on her body and no family members have the rash Past Medical History:  Diagnosis Date  . Aneurysm of aorta (HCC)   . Aneurysm, thoracic aortic ([place])   . Aortic insufficiency   . Ascending aortic aneurysm ([place])    CT in 2012 - 4.2x4.2cm  . Atrial fibrillation ([place])   . Bladder infection    h/o  . Coronary artery disease   . 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 Dehydration    HISTORY   . Fall at home 09/10/12  . Hemorrhoids   . Hip fracture ([place])    hip surgery 2001  . History of blood clots    in leg  . History of knee surgery   . Hypertension   . IDA (iron deficiency anemia) 06/07/2013  . Iron deficiency anemia, unspecified 03/14/2013   secondary to GI blood loss  . Kidney infection    h/o  . Melanoma of skin ([place]) 03/14/2013  . Melanoma of thigh ([place])    left  . Mini stroke ([place])   . S/P colonoscopy March 2010   RMR: friable anal canal hemorrhoids, hyperplastic ascending polyp, adenomatous descending polyp   . Stomach ulcer    secondary to h.pylori, s/p treatment  . Thyroid condition   . Venous stasis    edema   Past Surgical History:  Procedure Laterality Date  . APPENDECTOMY  1942  . BREAST LUMPECTOMY  1998  . CARDIAC CATHETERIZATION    . CATARACT EXTRACTION, BILATERAL    . CHOLECYSTECTOMY    . COLONOSCOPY  06/05/11   pancolonic diverticulosis/ileal reosion/abnormal  anorectal junction s/p biopsy: path for small intestine and Ti was benign with non-villous atrophy, rectal biopsy with prominent prolapse changes, no acute inflammation  . CORNEAL TRANSPLANT Bilateral 2013  . ENTEROSCOPY N/A 06/13/2013   WER:XVQM Gastritis/GI BLEED MOST LIKELY DUE TO DUODENAL ULCERS, AND ? AVMs  . ESOPHAGOGASTRODUODENOSCOPY  06/05/11   small hiatal hernia; + H.PYLORI GASTRITIS, s/p 5 days of Pylera, unable to finish due to N/V  . ESOPHAGOGASTRODUODENOSCOPY N/A 06/13/2013   Dr. Oneida Alar- see enteroscopy  . ESOPHAGOGASTRODUODENOSCOPY (EGD) WITH ESOPHAGEAL DILATION N/A 06/08/2013   GQQ:PYPPJ hiatal hernia;  otherwise normal EGD s/p dilation  . GIVENS CAPSULE STUDY N/A 06/08/2013   Procedure: GIVENS CAPSULE STUDY;  Surgeon: Daneil Dolin, MD;  Location: [facility];  Service: Endoscopy;  Laterality: N/A;  . HEMORRHOID BANDING    . KNEE SURGERY Right 05/2006   total right  . LEFT HEART CATH AND CORONARY ANGIOGRAPHY N/A 03/18/2017   Procedure: LEFT HEART CATH AND CORONARY ANGIOGRAPHY;  Surgeon: Wellington Hampshire, MD;  Location: [facility];  Service: Cardiovascular;  Laterality: N/A;  . LEG SURGERY    . MELANOMA EXCISION Left 08/2006   left leg excision  . NM MYOCAR PERF WALL MOTION  03/27/2010  dipyridamole; small reversible basal to mid-septal defect (?artifact), post-stress EF 55%, low risk scan   . PARTIAL HYSTERECTOMY  1976  . TONSILLECTOMY    . TOTAL HIP ARTHROPLASTY Left 2001&2004    X 2 FOR LEFT HIP  . TRANSTHORACIC ECHOCARDIOGRAM  10/06/2012   EF 55-60%, mod eccentric hypertrophy, grade 2 diastolic dysfunction; mildly calcifed AV annulus with moderate regurg; aortic root mildly dilated; LA severely dailted; PA peak pressure 26mHg  . VARICOSE VEIN SURGERY     Current Outpatient Medications on File Prior to Visit  Medication Sig Dispense Refill  . albuterol (PROAIR HFA) 108 (90 Base) MCG/ACT inhaler Inhale 1-2 puffs into the lungs every 6 (six) hours as needed for  wheezing or shortness of breath.    . carvedilol (COREG) 3.125 MG tablet Take 1 tablet (3.125 mg total) by mouth 2 (two) times daily with a meal. 180 tablet 3  . clopidogrel (PLAVIX) 75 MG tablet Take 75 mg by mouth daily with breakfast.    . cyanocobalamin ([employer]S VITAMIN B12) 2000 MCG tablet Take 1 tablet (2,000 mcg total) by mouth daily. 30 tablet 11  . folic acid (FOLVITE) 1 MG tablet Take 1 tablet (1 mg total) by mouth daily. 30 tablet 11  . furosemide (LASIX) 20 MG tablet Take 1 tablet (20 mg total) by mouth 2 (two) times daily. (Patient taking differently: Take 20 mg by mouth daily. ) 180 tablet 1  . gabapentin (NEURONTIN) 100 MG capsule TAKE 2 CAPSULES BY MOUTH 3  TIMES DAILY (Patient taking differently: Take 200 mg by mouth three times a day) 540 capsule 1  . hydrocortisone (ANUSOL-HC) 25 MG suppository Place 1 suppository (25 mg total) rectally every 12 (twelve) hours. (Patient taking differently: Place 25 mg rectally 2 (two) times daily. ) 12 suppository 1  . mupirocin ointment (BACTROBAN) 2 % Place 1 application into the nose 2 (two) times daily.    . ondansetron (ZOFRAN) 4 MG tablet TAKE 1 TABLET BY MOUTH  EVERY 8 HOURS AS NEEDED FOR NAUSEA AND VOMITING 60 tablet 0  . pantoprazole (PROTONIX) 40 MG tablet Take 1 tablet (40 mg total) by mouth 2 (two) times daily. 60 tablet 11  . potassium chloride SA (K-DUR,KLOR-CON) 20 MEQ tablet Take 1 tablet (20 mEq total) by mouth daily. 90 tablet 3  . prednisoLONE acetate (PRED FORTE) 1 % ophthalmic suspension Place 1 drop into both eyes 4 (four) times daily.     . psyllium (METAMUCIL) 58.6 % packet Take 1 packet by mouth daily as needed (for constipation).     . sodium chloride (MURO 128) 2 % ophthalmic solution Place 1 drop into both eyes 3 (three) times daily.     . traMADol-acetaminophen (ULTRACET) 37.5-325 MG tablet TAKE 1 TABLET BY MOUTH EVERY 4 HOURS AS NEEDED FOR PAIN 180 tablet 0   No current facility-administered medications on file prior to  visit.    Allergies  Allergen Reactions  . Codeine Diarrhea and Nausea Only  . Tape Other (See Comments)    SKIN IS VERY THIN AND TEARS AND BRUISES EASILY; PLEASE USE COBAN WRAP!!  . Diona Fanti [Aspirin] Rash and Other (See Comments)    Petechia   . Iron Nausea And Vomiting    Oral iron causes nausea and vomiting  . Penicillins Rash    Has patient had a PCN reaction causing immediate rash, facial/tongue/throat swelling, SOB or lightheadedness with hypotension: Yes Has patient had a PCN reaction causing severe rash involving mucus membranes or skin necrosis: Unknown  Has patient had a PCN reaction that required hospitalization: No Has patient had a PCN reaction occurring within the last 10 years: No If all of the above answers are "NO", then may proceed with Cephalosporin use.    Social History   Socioeconomic History  . Marital status: Widowed    Spouse name: Not on file  . Number of children: 5  . Years of education: 6  . Highest education level: Not on file  Social Needs  . Financial resource strain: Not on file  . Food insecurity - worry: Not on file  . Food insecurity - inability: Not on file  . Transportation needs - medical: Not on file  . Transportation needs - non-medical: Not on file  Occupational History    Employer: RETIRED  Tobacco Use  . Smoking status: Former Smoker    Packs/day: 1.50    Years: 20.00    Pack years: 30.00    Types: Cigarettes    Last attempt to quit: 07/05/1975    Years since quitting: 42.1  . Smokeless tobacco: Never Used  . Tobacco comment: Quit smoking in 1975  Substance and Sexual Activity  . Alcohol use: No  . Drug use: No  . Sexual activity: No  Other Topics Concern  . Not on file  Social History Narrative  . Not on file      Review of Systems  All other systems reviewed and are negative.      Objective:   Physical Exam  Cardiovascular: Normal rate, regular rhythm and normal heart sounds.  Pulmonary/Chest: Effort normal and  breath sounds normal.  Musculoskeletal: She exhibits edema.  Skin: Rash noted. There is erythema.     Vitals reviewed.         Assessment & Plan:  Venous stasis dermatitis of both lower extremities  I believe this is venous stasis dermatitis related to her varicose veins in her pitting edema. Recommended Elocon cream applied once daily for 7-10 days. Recheck in one week if no better or sooner if worse

## 2017-08-12 ENCOUNTER — Other Ambulatory Visit: Payer: Self-pay | Admitting: Orthopedic Surgery

## 2017-08-12 DIAGNOSIS — M545 Low back pain: Secondary | ICD-10-CM

## 2017-08-12 DIAGNOSIS — M79604 Pain in right leg: Secondary | ICD-10-CM

## 2017-08-24 ENCOUNTER — Ambulatory Visit (INDEPENDENT_AMBULATORY_CARE_PROVIDER_SITE_OTHER): Payer: Medicare Other | Admitting: Cardiovascular Disease

## 2017-08-24 ENCOUNTER — Encounter: Payer: Self-pay | Admitting: Cardiovascular Disease

## 2017-08-24 VITALS — BP 112/64 | HR 87 | Ht 65.0 in | Wt 163.0 lb

## 2017-08-24 DIAGNOSIS — I482 Chronic atrial fibrillation, unspecified: Secondary | ICD-10-CM

## 2017-08-24 DIAGNOSIS — I5022 Chronic systolic (congestive) heart failure: Secondary | ICD-10-CM | POA: Diagnosis not present

## 2017-08-24 DIAGNOSIS — I7121 Aneurysm of the ascending aorta, without rupture: Secondary | ICD-10-CM

## 2017-08-24 DIAGNOSIS — I712 Thoracic aortic aneurysm, without rupture: Secondary | ICD-10-CM

## 2017-08-24 NOTE — Patient Instructions (Addendum)
Your physician wants you to follow-up in:  6 months with Dr.Koneswaran You will receive a reminder letter in the mail two months in advance. If you don't receive a letter, please call our office to schedule the follow-up appointment.    Your physician recommends that you continue on your current medications as directed. Please refer to the Current Medication list given to you today.    If you need a refill on your cardiac medications before your next appointment, please call your pharmacy.     No testing ordered today.    Thank you for choosing [facility] !

## 2017-08-24 NOTE — Progress Notes (Signed)
SUBJECTIVE: The patient presents for follow-up of chronic systolic heart failure and chronic atrial fibrillation.  She is here with her daughter.  She continues to live by herself but her daughter visits her daily.  She denies chest pain, palpitations, shortness of breath.  She has struggled with hemorrhoids recently.  She has some dizziness when going from the sitting to standing position.  She denies a history of falls.  Blood pressure yesterday was 116/62.  She has significant bilateral venous varicosities and has some chronic lower extremity swelling from that.   Hemoglobin was 12.6 on 04/13/17.  Coronary angiography on 03/18/17 demonstrated mild nonobstructive disease.  Echocardiogram 03/17/17 demonstrated moderately reduced left ventricular systolic function, LVEF 38-10%. There was mild to moderate aortic regurgitation and mild to moderate mitral regurgitation. There was moderate tricuspid regurgitation as well.  She will be celebrating her 89rd birthday this week!  Review of Systems: As per "subjective", otherwise negative.  Allergies  Allergen Reactions  . Codeine Diarrhea and Nausea Only  . Tape Other (See Comments)    SKIN IS VERY THIN AND TEARS AND BRUISES EASILY; PLEASE USE COBAN WRAP!!  . Diona Fanti [Aspirin] Rash and Other (See Comments)    Petechia   . Iron Nausea And Vomiting    Oral iron causes nausea and vomiting  . Penicillins Rash    Has patient had a PCN reaction causing immediate rash, facial/tongue/throat swelling, SOB or lightheadedness with hypotension: Yes Has patient had a PCN reaction causing severe rash involving mucus membranes or skin necrosis: Unknown Has patient had a PCN reaction that required hospitalization: No Has patient had a PCN reaction occurring within the last 10 years: No If all of the above answers are "NO", then may proceed with Cephalosporin use.     Current Outpatient Medications  Medication Sig Dispense Refill  . albuterol  (PROAIR HFA) 108 (90 Base) MCG/ACT inhaler Inhale 1-2 puffs into the lungs every 6 (six) hours as needed for wheezing or shortness of breath.    . carvedilol (COREG) 3.125 MG tablet Take 1 tablet (3.125 mg total) by mouth 2 (two) times daily with a meal. 180 tablet 3  . clopidogrel (PLAVIX) 75 MG tablet Take 75 mg by mouth daily with breakfast.    . cyanocobalamin ([employer] VITAMIN B12) 2000 MCG tablet Take 1 tablet (2,000 mcg total) by mouth daily. 30 tablet 11  . folic acid (FOLVITE) 1 MG tablet Take 1 tablet (1 mg total) by mouth daily. 30 tablet 11  . furosemide (LASIX) 20 MG tablet Take 1 tablet (20 mg total) by mouth 2 (two) times daily. (Patient taking differently: Take 20 mg by mouth daily. ) 180 tablet 1  . gabapentin (NEURONTIN) 100 MG capsule TAKE 2 CAPSULES BY MOUTH 3  TIMES DAILY (Patient taking differently: Take 200 mg by mouth three times a day) 540 capsule 1  . hydrocortisone (ANUSOL-HC) 25 MG suppository Place 1 suppository (25 mg total) rectally every 12 (twelve) hours. (Patient taking differently: Place 25 mg rectally 2 (two) times daily. ) 12 suppository 1  . mometasone (ELOCON) 0.1 % cream Apply 1 application topically daily. 45 g 0  . mupirocin ointment (BACTROBAN) 2 % Place 1 application into the nose 2 (two) times daily.    . ondansetron (ZOFRAN) 4 MG tablet TAKE 1 TABLET BY MOUTH  EVERY 8 HOURS AS NEEDED FOR NAUSEA AND VOMITING 60 tablet 0  . pantoprazole (PROTONIX) 40 MG tablet Take 1 tablet (40 mg total) by mouth  2 (two) times daily. 60 tablet 11  . potassium chloride SA (K-DUR,KLOR-CON) 20 MEQ tablet Take 1 tablet (20 mEq total) by mouth daily. 90 tablet 3  . prednisoLONE acetate (PRED FORTE) 1 % ophthalmic suspension Place 1 drop into both eyes 4 (four) times daily.     . psyllium (METAMUCIL) 58.6 % packet Take 1 packet by mouth daily as needed (for constipation).     . sodium chloride (MURO 128) 2 % ophthalmic solution Place 1 drop into both eyes 3 (three) times daily.     .  traMADol-acetaminophen (ULTRACET) 37.5-325 MG tablet TAKE 1 TABLET BY MOUTH EVERY 4 HOURS AS NEEDED FOR PAIN 180 tablet 0   No current facility-administered medications for this visit.     Past Medical History:  Diagnosis Date  . Aneurysm of aorta (HCC)   . Aneurysm, thoracic aortic ([place])   . Aortic insufficiency   . Ascending aortic aneurysm ([place])    CT in 2012 - 4.2x4.2cm  . Atrial fibrillation ([place])   . Bladder infection    h/o  . Coronary artery disease   . 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 Dehydration    HISTORY   . Fall at home 09/10/12  . Hemorrhoids   . Hip fracture ([place])    hip surgery 2001  . History of blood clots    in leg  . History of knee surgery   . Hypertension   . IDA (iron deficiency anemia) 06/07/2013  . Iron deficiency anemia, unspecified 03/14/2013   secondary to GI blood loss  . Kidney infection    h/o  . Melanoma of skin ([place]) 03/14/2013  . Melanoma of thigh ([place])    left  . Mini stroke ([place])   . S/P colonoscopy March 2010   RMR: friable anal canal hemorrhoids, hyperplastic ascending polyp, adenomatous descending polyp   . Stomach ulcer    secondary to h.pylori, s/p treatment  . Thyroid condition   . Venous stasis    edema    Past Surgical History:  Procedure Laterality Date  . APPENDECTOMY  1942  . BREAST LUMPECTOMY  1998  . CARDIAC CATHETERIZATION    . CATARACT EXTRACTION, BILATERAL    . CHOLECYSTECTOMY    . COLONOSCOPY  06/05/11   pancolonic diverticulosis/ileal reosion/abnormal anorectal junction s/p biopsy: path for small intestine and Ti was benign with non-villous atrophy, rectal biopsy with prominent prolapse changes, no acute inflammation  . CORNEAL TRANSPLANT Bilateral 2013  . ENTEROSCOPY N/A 06/13/2013   NFA:OZHY Gastritis/GI BLEED MOST LIKELY DUE TO DUODENAL ULCERS, AND ? AVMs  . ESOPHAGOGASTRODUODENOSCOPY  06/05/11   small hiatal hernia; + H.PYLORI GASTRITIS, s/p 5 days of Pylera, unable to  finish due to N/V  . ESOPHAGOGASTRODUODENOSCOPY N/A 06/13/2013   Dr. Oneida Alar- see enteroscopy  . ESOPHAGOGASTRODUODENOSCOPY (EGD) WITH ESOPHAGEAL DILATION N/A 06/08/2013   QMV:HQION hiatal hernia;  otherwise normal EGD s/p dilation  . GIVENS CAPSULE STUDY N/A 06/08/2013   Procedure: GIVENS CAPSULE STUDY;  Surgeon: Daneil Dolin, MD;  [facility];  Service: Endoscopy;  Laterality: N/A;  . HEMORRHOID BANDING    . KNEE SURGERY Right 05/2006   total right  . LEFT HEART CATH AND CORONARY ANGIOGRAPHY N/A 03/18/2017   Procedure: LEFT HEART CATH AND CORONARY ANGIOGRAPHY;  Surgeon: Wellington Hampshire, MD;  Location: [facility];  Service: Cardiovascular;  Laterality: N/A;  . LEG SURGERY    . MELANOMA EXCISION Left 08/2006  left leg excision  . NM MYOCAR PERF WALL MOTION  03/27/2010   dipyridamole; small reversible basal to mid-septal defect (?artifact), post-stress EF 55%, low risk scan   . PARTIAL HYSTERECTOMY  1976  . TONSILLECTOMY    . TOTAL HIP ARTHROPLASTY Left 2001&2004    X 2 FOR LEFT HIP  . TRANSTHORACIC ECHOCARDIOGRAM  10/06/2012   EF 55-60%, mod eccentric hypertrophy, grade 2 diastolic dysfunction; mildly calcifed AV annulus with moderate regurg; aortic root mildly dilated; LA severely dailted; PA peak pressure 38mHg  . VARICOSE VEIN SURGERY      Social History   Socioeconomic History  . Marital status: Widowed    Spouse name: Not on file  . Number of children: 5  . Years of education: 6  . Highest education level: Not on file  Social Needs  . Financial resource strain: Not on file  . Food insecurity - worry: Not on file  . Food insecurity - inability: Not on file  . Transportation needs - medical: Not on file  . Transportation needs - non-medical: Not on file  Occupational History    Employer: RETIRED  Tobacco Use  . Smoking status: Former Smoker    Packs/day: 1.50    Years: 20.00    Pack years: 30.00    Types: Cigarettes    Last attempt to quit:  07/05/1975    Years since quitting: 42.1  . Smokeless tobacco: Never Used  . Tobacco comment: Quit smoking in 1975  Substance and Sexual Activity  . Alcohol use: No  . Drug use: No  . Sexual activity: No  Other Topics Concern  . Not on file  Social History Narrative  . Not on file     Vitals:   08/24/17 0853  BP: 112/64  Pulse: 87  SpO2: 94%  Weight: 163 lb (73.9 kg)  Height: 5\' 5"  (1.651 m)    Wt Readings from Last 3 Encounters:  08/24/17 163 lb (73.9 kg)  08/11/17 162 lb (73.5 kg)  08/10/17 162 lb (73.5 kg)     PHYSICAL EXAM General: NAD HEENT: Normal. Neck: No JVD, no thyromegaly. Lungs: Clear to auscultation bilaterally with normal respiratory effort. ZG:YFVCBSW rate and irregular rhythm, normal S1/S2, no S3, 2/6 systolic murmur along left sternal border and 2/4 holodiastolic murmur along left sternal border. Bilateral venous varicosities with chronic lower extremity edema. No pitting edema.  Abdomen: Soft, nontender, no distention.  Neurologic: Alert and oriented.  Psych: Normal affect. Skin: Normal. Musculoskeletal: No gross deformities.    ECG: Most recent ECG reviewed.   Labs:    Lipids: Lab Results  Component Value Date/Time   LDLCALC 119 (H) 03/17/2017 01:50 AM   CHOL 189 03/17/2017 01:50 AM   TRIG 73 03/17/2017 01:50 AM   HDL 55 03/17/2017 01:50 AM       ASSESSMENT AND PLAN:  1. Chronic systolic heart failure/stress induced cardiomyopathy: Symptomatically stable with stable weight. Continue carvedilol and Lasix 20 mg daily.  2. Aortic root dilatation/thoracic aortic aneurysm with mild to moderate aortic regurgitation: Given her age she does not want any interventions performed if one is needed in the future. At this time, no further testing is warranted.  3. Chronic atrial fibrillation: Not an anticoagulation candidate due to prior GI bleeding. Takes Plavix for history of TIA and aspirin intolerance. Symptomatically stable on carvedilol.  No changes to therapy.     Disposition: Follow up 6 months.   Kate Sable, M.D., F.A.C.C.

## 2017-08-26 ENCOUNTER — Ambulatory Visit
Admission: RE | Admit: 2017-08-26 | Discharge: 2017-08-26 | Disposition: A | Discharge status: Home / Self Care | Payer: Medicare Other | Source: Ambulatory Visit | Attending: Orthopedic Surgery | Admitting: Orthopedic Surgery

## 2017-08-26 DIAGNOSIS — M545 Low back pain, unspecified: Secondary | ICD-10-CM

## 2017-08-26 DIAGNOSIS — M4727 Other spondylosis with radiculopathy, lumbosacral region: Secondary | ICD-10-CM | POA: Diagnosis not present

## 2017-08-26 MED ORDER — METHYLPREDNISOLONE ACETATE 40 MG/ML INJ SUSP (RADIOLOG
120.0000 mg | Freq: Once | INTRAMUSCULAR | Status: AC
  Administered 2017-08-26: 120 mg via EPIDURAL

## 2017-08-26 MED ORDER — IOPAMIDOL (ISOVUE-M 200) INJECTION 41%
1.0000 mL | Freq: Once | INTRAMUSCULAR | Status: AC
  Administered 2017-08-26: 1 mL via EPIDURAL

## 2017-08-26 NOTE — Discharge Instructions (Signed)

## 2017-08-29 ENCOUNTER — Other Ambulatory Visit: Payer: Self-pay | Admitting: Family Medicine

## 2017-08-31 NOTE — Telephone Encounter (Signed)
Requesting refill    Ultracet  LOV: 08/11/17  LRF:  07/21/17

## 2017-09-03 ENCOUNTER — Other Ambulatory Visit: Payer: Self-pay | Admitting: Orthopedic Surgery

## 2017-09-03 DIAGNOSIS — M25552 Pain in left hip: Secondary | ICD-10-CM

## 2017-09-03 DIAGNOSIS — M48061 Spinal stenosis, lumbar region without neurogenic claudication: Secondary | ICD-10-CM

## 2017-09-03 DIAGNOSIS — M5416 Radiculopathy, lumbar region: Secondary | ICD-10-CM

## 2017-10-05 ENCOUNTER — Other Ambulatory Visit: Payer: Self-pay | Admitting: Family Medicine

## 2017-10-05 NOTE — Telephone Encounter (Signed)
Requesting refill      LOV:  08/11/17 LRF:  08/31/17

## 2017-11-05 ENCOUNTER — Ambulatory Visit (INDEPENDENT_AMBULATORY_CARE_PROVIDER_SITE_OTHER): Payer: Medicare Other | Admitting: Family Medicine

## 2017-11-05 ENCOUNTER — Encounter: Payer: Self-pay | Admitting: Family Medicine

## 2017-11-05 VITALS — BP 122/60 | HR 60 | Temp 97.9°F | Resp 12 | Wt 163.0 lb

## 2017-11-05 DIAGNOSIS — D649 Anemia, unspecified: Secondary | ICD-10-CM | POA: Diagnosis not present

## 2017-11-05 DIAGNOSIS — I5022 Chronic systolic (congestive) heart failure: Secondary | ICD-10-CM | POA: Diagnosis not present

## 2017-11-05 DIAGNOSIS — R1031 Right lower quadrant pain: Secondary | ICD-10-CM

## 2017-11-05 LAB — URINALYSIS, ROUTINE W REFLEX MICROSCOPIC
BACTERIA UA: NONE SEEN /HPF
Bilirubin Urine: NEGATIVE
Glucose, UA: NEGATIVE
KETONES UR: NEGATIVE
Nitrite: NEGATIVE
PH: 5 (ref 5.0–8.0)
PROTEIN: NEGATIVE
Specific Gravity, Urine: 1.015 (ref 1.001–1.03)

## 2017-11-05 LAB — MICROSCOPIC MESSAGE

## 2017-11-05 MED ORDER — CLOPIDOGREL BISULFATE 75 MG PO TABS: 75.0000 mg | ORAL_TABLET | Freq: Every day | ORAL | 3 refills | Status: DC

## 2017-11-05 MED ORDER — SULFAMETHOXAZOLE-TRIMETHOPRIM 800-160 MG PO TABS
1.0000 | ORAL_TABLET | Freq: Two times a day (BID) | ORAL | 0 refills | Status: DC
Start: 2017-11-05 — End: 2017-12-02

## 2017-11-05 NOTE — Progress Notes (Signed)
Subjective:    Patient ID: [name] [name], female    DOB: [birth year], 84 y.o.   MRN: [id number]  HPI Patient presents with her daughter today with 2 concerns.  First, there has been increased leg swelling over the last 6 days.  Patient only takes one fluid pill a day.  Over the last 6 days, her daughter has given her 2 fluid pills a day as well as 2 potassium tablets a day and the swelling in her legs has not improved.  However her weight is only 1 pound higher than it was in January.  Most of the swelling appears to be dependent edema in the lower extremities distal to the knees where the patient is not walking and sits the majority of the day with her feet in a dependent position.  She denies any chest pain or shortness of breath.  She does have faint bibasilar rales which are not extensive.  She is in atrial fibrillation on exam but her heart rate is well controlled.  She denies any orthopnea.  There is no evidence of JVD.  She does not appear to be fluid overloaded.  Second she presents with several weeks of off and on right lower quadrant pain.  She has a history of an appendectomy.  She denies any constipation.  She denies any dysuria or hematuria.  She denies any diarrhea.  She denies any blood in her stool.  Urinalysis today does show trace leukocyte esterase and trace blood and she is tender to palpation over the bladder.  There is no guarding or rebound Past Medical History:  Diagnosis Date  . Aneurysm of aorta (HCC)   . Aneurysm, thoracic aortic ([place])   . Aortic insufficiency   . Ascending aortic aneurysm ([place])    CT in 2012 - 4.2x4.2cm  . Atrial fibrillation ([place])   . Bladder infection    h/o  . Coronary artery disease   . 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 Dehydration    HISTORY   . Fall at home 09/10/12  . Hemorrhoids   . Hip fracture ([place])    hip surgery 2001  . History of blood clots    in leg  . History of knee surgery   . Hypertension    . IDA (iron deficiency anemia) 06/07/2013  . Iron deficiency anemia, unspecified 03/14/2013   secondary to GI blood loss  . Kidney infection    h/o  . Melanoma of skin ([place]) 03/14/2013  . Melanoma of thigh ([place])    left  . Mini stroke ([place])   . S/P colonoscopy March 2010   RMR: friable anal canal hemorrhoids, hyperplastic ascending polyp, adenomatous descending polyp   . Stomach ulcer    secondary to h.pylori, s/p treatment  . Thyroid condition   . Venous stasis    edema   Past Surgical History:  Procedure Laterality Date  . APPENDECTOMY  1942  . BREAST LUMPECTOMY  1998  . CARDIAC CATHETERIZATION    . CATARACT EXTRACTION, BILATERAL    . CHOLECYSTECTOMY    . COLONOSCOPY  06/05/11   pancolonic diverticulosis/ileal reosion/abnormal anorectal junction s/p biopsy: path for small intestine and Ti was benign with non-villous atrophy, rectal biopsy with prominent prolapse changes, no acute inflammation  . CORNEAL TRANSPLANT Bilateral 2013  . ENTEROSCOPY N/A 06/13/2013   QHU:TMLY Gastritis/GI BLEED MOST LIKELY DUE TO DUODENAL ULCERS, AND ? AVMs  . ESOPHAGOGASTRODUODENOSCOPY  06/05/11   small hiatal  hernia; + H.PYLORI GASTRITIS, s/p 5 days of Pylera, unable to finish due to N/V  . ESOPHAGOGASTRODUODENOSCOPY N/A 06/13/2013   Dr. Oneida Alar- see enteroscopy  . ESOPHAGOGASTRODUODENOSCOPY (EGD) WITH ESOPHAGEAL DILATION N/A 06/08/2013   WJX:[id number] hiatal hernia;  otherwise normal EGD s/p dilation  . GIVENS CAPSULE STUDY N/A 06/08/2013   Procedure: GIVENS CAPSULE STUDY;  Surgeon: Daneil Dolin, MD;  [facility];  Service: Endoscopy;  Laterality: N/A;  . HEMORRHOID BANDING    . KNEE SURGERY Right 05/2006   total right  . LEFT HEART CATH AND CORONARY ANGIOGRAPHY N/A 03/18/2017   Procedure: LEFT HEART CATH AND CORONARY ANGIOGRAPHY;  Surgeon: Wellington Hampshire, MD;  Location: [facility];  Service: Cardiovascular;  Laterality: N/A;  . LEG SURGERY    . MELANOMA EXCISION Left 08/2006    left leg excision  . NM MYOCAR PERF WALL MOTION  03/27/2010   dipyridamole; small reversible basal to mid-septal defect (?artifact), post-stress EF 55%, low risk scan   . PARTIAL HYSTERECTOMY  1976  . TONSILLECTOMY    . TOTAL HIP ARTHROPLASTY Left 2001&2004    X 2 FOR LEFT HIP  . TRANSTHORACIC ECHOCARDIOGRAM  10/06/2012   EF 55-60%, mod eccentric hypertrophy, grade 2 diastolic dysfunction; mildly calcifed AV annulus with moderate regurg; aortic root mildly dilated; LA severely dailted; PA peak pressure 31mHg  . VARICOSE VEIN SURGERY     Current Outpatient Medications on File Prior to Visit  Medication Sig Dispense Refill  . albuterol (PROAIR HFA) 108 (90 Base) MCG/ACT inhaler Inhale 1-2 puffs into the lungs every 6 (six) hours as needed for wheezing or shortness of breath.    . carvedilol (COREG) 3.125 MG tablet Take 1 tablet (3.125 mg total) by mouth 2 (two) times daily with a meal. 180 tablet 3  . clopidogrel (PLAVIX) 75 MG tablet Take 75 mg by mouth daily with breakfast.    . cyanocobalamin ([employer] VITAMIN B12) 2000 MCG tablet Take 1 tablet (2,000 mcg total) by mouth daily. 30 tablet 11  . folic acid (FOLVITE) 1 MG tablet Take 1 tablet (1 mg total) by mouth daily. 30 tablet 11  . furosemide (LASIX) 20 MG tablet Take 1 tablet (20 mg total) by mouth 2 (two) times daily. (Patient taking differently: Take 20 mg by mouth daily. ) 180 tablet 1  . gabapentin (NEURONTIN) 100 MG capsule TAKE 2 CAPSULES BY MOUTH 3  TIMES DAILY 540 capsule 1  . hydrocortisone (ANUSOL-HC) 25 MG suppository Place 1 suppository (25 mg total) rectally every 12 (twelve) hours. (Patient taking differently: Place 25 mg rectally 2 (two) times daily. ) 12 suppository 1  . mometasone (ELOCON) 0.1 % cream Apply 1 application topically daily. 45 g 0  . mupirocin ointment (BACTROBAN) 2 % Place 1 application into the nose 2 (two) times daily.    . ondansetron (ZOFRAN) 4 MG tablet TAKE 1 TABLET BY MOUTH  EVERY 8 HOURS AS NEEDED FOR NAUSEA  AND VOMITING 60 tablet 0  . pantoprazole (PROTONIX) 40 MG tablet Take 1 tablet (40 mg total) by mouth 2 (two) times daily. 60 tablet 11  . potassium chloride SA (K-DUR,KLOR-CON) 20 MEQ tablet Take 1 tablet (20 mEq total) by mouth daily. 90 tablet 3  . prednisoLONE acetate (PRED FORTE) 1 % ophthalmic suspension Place 1 drop into both eyes 4 (four) times daily.     . psyllium (METAMUCIL) 58.6 % packet Take 1 packet by mouth daily as needed (for constipation).     [place] Kitchen  sodium chloride (MURO 128) 2 % ophthalmic solution Place 1 drop into both eyes 3 (three) times daily.     . traMADol-acetaminophen (ULTRACET) 37.5-325 MG tablet TAKE 1 TABLET BY MOUTH EVERY 4 HOURS AS NEEDED FOR PAIN 180 tablet 0   No current facility-administered medications on file prior to visit.    Allergies  Allergen Reactions  . Codeine Diarrhea and Nausea Only  . Tape Other (See Comments)    SKIN IS VERY THIN AND TEARS AND BRUISES EASILY; PLEASE USE COBAN WRAP!!  . Diona Fanti [Aspirin] Rash and Other (See Comments)    Petechia   . Iron Nausea And Vomiting    Oral iron causes nausea and vomiting  . Penicillins Rash    Has patient had a PCN reaction causing immediate rash, facial/tongue/throat swelling, SOB or lightheadedness with hypotension: Yes Has patient had a PCN reaction causing severe rash involving mucus membranes or skin necrosis: Unknown Has patient had a PCN reaction that required hospitalization: No Has patient had a PCN reaction occurring within the last 10 years: No If all of the above answers are "NO", then may proceed with Cephalosporin use.    Social History   Socioeconomic History  . Marital status: Widowed    Spouse name: Not on file  . Number of children: 5  . Years of education: 6  . Highest education level: Not on file  Occupational History    Employer: RETIRED  Social Needs  . Financial resource strain: Not on file  . Food insecurity:    Worry: Not on file    Inability: Not on file  .  Transportation needs:    Medical: Not on file    Non-medical: Not on file  Tobacco Use  . Smoking status: Former Smoker    Packs/day: 1.50    Years: 20.00    Pack years: 30.00    Types: Cigarettes    Last attempt to quit: 07/05/1975    Years since quitting: 42.3  . Smokeless tobacco: Never Used  . Tobacco comment: Quit smoking in 1975  Substance and Sexual Activity  . Alcohol use: No  . Drug use: No  . Sexual activity: Never  Lifestyle  . Physical activity:    Days per week: Not on file    Minutes per session: Not on file  . Stress: Not on file  Relationships  . Social connections:    Talks on phone: Not on file    Gets together: Not on file    Attends religious service: Not on file    Active member of club or organization: Not on file    Attends meetings of clubs or organizations: Not on file    Relationship status: Not on file  . Intimate partner violence:    Fear of current or ex partner: Not on file    Emotionally abused: Not on file    Physically abused: Not on file    Forced sexual activity: Not on file  Other Topics Concern  . Not on file  Social History Narrative  . Not on file      Review of Systems  All other systems reviewed and are negative.      Objective:   Physical Exam  Constitutional: She appears well-developed and well-nourished.  Neck: No JVD present.  Cardiovascular: Normal rate and normal heart sounds. An irregularly irregular rhythm present.  Pulmonary/Chest: Effort normal. No respiratory distress. She has no wheezes. She has rales in the left lower field. She exhibits no  tenderness.  Abdominal: Soft. Bowel sounds are normal. She exhibits no distension and no mass. There is tenderness. There is no rebound and no guarding.  Musculoskeletal: She exhibits edema.  Lymphadenopathy:    She has no cervical adenopathy.  Vitals reviewed.         Assessment & Plan:  RLQ abdominal pain - Plan: CBC with Differential/Platelet, COMPLETE  METABOLIC PANEL WITH GFR, Urinalysis, Routine w reflex microscopic, DG Abd 2 Views, sulfamethoxazole-trimethoprim (BACTRIM DS,SEPTRA DS) 800-160 MG tablet  Chronic systolic congestive heart failure (HCC) - Plan: Brain natriuretic peptide  Patient has significant edema distal to the knee but majority of this appears to be dependent edema from the patient sitting the majority of the day and being unable to walk or stand without tremendous effort.  I am concerned about possible dehydration and overuse of diuretics.  Therefore I will check a BMP along with a BNP to evaluate for signs of prerenal azotemia in a patient with known chronic kidney disease as well as signs of fluid overload.  If these are normal, I would recommend treating the leg swelling with compression hose and elevation rather than increasing diuretics.  The right lower quadrant abdominal pain is vague and nonspecific.  Some of it seems to be muscular and related to the pain in her right hip as hip flexion makes the pain worse.  She also appears to have a possible urinary tract infection.  I will send a culture and treat this empirically with Bactrim double strength tablets twice a day for 5 days.  I will also obtain an x-ray of the abdomen to evaluate for constipation as this can frequently happen in a patient who is sedentary and elderly.

## 2017-11-05 NOTE — Addendum Note (Signed)
Addended by: Shary Decamp B on: 11/05/2017 04:40 PM   Modules accepted: Orders

## 2017-11-06 LAB — URINE CULTURE
MICRO NUMBER:: 90419408
Result:: NO GROWTH
SPECIMEN QUALITY:: ADEQUATE

## 2017-11-06 LAB — BRAIN NATRIURETIC PEPTIDE: Brain Natriuretic Peptide: 559 pg/mL — ABNORMAL HIGH (ref <100)

## 2017-11-09 ENCOUNTER — Encounter: Payer: Self-pay | Admitting: Family Medicine

## 2017-11-09 ENCOUNTER — Ambulatory Visit (INDEPENDENT_AMBULATORY_CARE_PROVIDER_SITE_OTHER): Payer: Medicare Other | Admitting: Family Medicine

## 2017-11-09 VITALS — BP 100/50 | HR 66 | Temp 97.7°F | Resp 12 | Ht 65.0 in | Wt 160.0 lb

## 2017-11-09 DIAGNOSIS — M7989 Other specified soft tissue disorders: Secondary | ICD-10-CM | POA: Diagnosis not present

## 2017-11-09 DIAGNOSIS — I5022 Chronic systolic (congestive) heart failure: Secondary | ICD-10-CM | POA: Diagnosis not present

## 2017-11-09 DIAGNOSIS — D509 Iron deficiency anemia, unspecified: Secondary | ICD-10-CM | POA: Diagnosis not present

## 2017-11-09 LAB — IRON, TOTAL/TOTAL IRON BINDING CAP
%SAT: 8 % (calc) — ABNORMAL LOW (ref 11–50)
Iron: 36 ug/dL — ABNORMAL LOW (ref 45–160)
TIBC: 447 ug/dL (ref 250–450)

## 2017-11-09 LAB — CBC WITH DIFFERENTIAL/PLATELET
BASOS PCT: 0.8 %
Basophils Absolute: 39 cells/uL (ref 0–200)
EOS ABS: 118 {cells}/uL (ref 15–500)
Eosinophils Relative: 2.4 %
HCT: 33.2 % — ABNORMAL LOW (ref 35.0–45.0)
HEMOGLOBIN: 10.5 g/dL — AB (ref 11.7–15.5)
Lymphs Abs: 1779 cells/uL (ref 850–3900)
MCH: 27.5 pg (ref 27.0–33.0)
MCHC: 31.6 g/dL — ABNORMAL LOW (ref 32.0–36.0)
MCV: 86.9 fL (ref 80.0–100.0)
MONOS PCT: 10.4 %
MPV: 10.3 fL (ref 7.5–12.5)
NEUTROS ABS: 2455 {cells}/uL (ref 1500–7800)
Neutrophils Relative %: 50.1 %
PLATELETS: 180 10*3/uL (ref 140–400)
RBC: 3.82 10*6/uL (ref 3.80–5.10)
RDW: 13.9 % (ref 11.0–15.0)
TOTAL LYMPHOCYTE: 36.3 %
WBC: 4.9 10*3/uL (ref 3.8–10.8)
WBCMIX: 510 {cells}/uL (ref 200–950)

## 2017-11-09 LAB — COMPLETE METABOLIC PANEL WITH GFR
AG Ratio: 1.5 (calc) (ref 1.0–2.5)
ALBUMIN MSPROF: 4 g/dL (ref 3.6–5.1)
ALT: 5 U/L — AB (ref 6–29)
AST: 14 U/L (ref 10–35)
Alkaline phosphatase (APISO): 70 U/L (ref 33–130)
BILIRUBIN TOTAL: 0.7 mg/dL (ref 0.2–1.2)
BUN / CREAT RATIO: 19 (calc) (ref 6–22)
BUN: 24 mg/dL (ref 7–25)
CALCIUM: 8.6 mg/dL (ref 8.6–10.4)
CO2: 28 mmol/L (ref 20–32)
CREATININE: 1.27 mg/dL — AB (ref 0.60–0.88)
Chloride: 108 mmol/L (ref 98–110)
GFR, EST AFRICAN AMERICAN: 42 mL/min/{1.73_m2} — AB (ref >=60)
GFR, EST NON AFRICAN AMERICAN: 36 mL/min/{1.73_m2} — AB (ref >=60)
GLUCOSE: 91 mg/dL (ref 65–99)
Globulin: 2.7 g/dL (calc) (ref 1.9–3.7)
Potassium: 5 mmol/L (ref 3.5–5.3)
Sodium: 144 mmol/L (ref 135–146)
TOTAL PROTEIN: 6.7 g/dL (ref 6.1–8.1)

## 2017-11-09 LAB — BASIC METABOLIC PANEL WITH GFR
BUN / CREAT RATIO: 14 (calc) (ref 6–22)
BUN: 30 mg/dL — AB (ref 7–25)
CO2: 28 mmol/L (ref 20–32)
CREATININE: 2.13 mg/dL — AB (ref 0.60–0.88)
Calcium: 8.6 mg/dL (ref 8.6–10.4)
Chloride: 104 mmol/L (ref 98–110)
GFR, EST AFRICAN AMERICAN: 23 mL/min/{1.73_m2} — AB (ref >=60)
GFR, Est Non African American: 19 mL/min/{1.73_m2} — ABNORMAL LOW (ref >=60)
Glucose, Bld: 89 mg/dL (ref 65–99)
Potassium: 4.1 mmol/L (ref 3.5–5.3)
Sodium: 140 mmol/L (ref 135–146)

## 2017-11-09 LAB — FERRITIN: Ferritin: 14 ng/mL — ABNORMAL LOW (ref 20–288)

## 2017-11-09 LAB — TEST AUTHORIZATION

## 2017-11-09 LAB — EXTRA LAV TOP TUBE

## 2017-11-09 NOTE — Progress Notes (Signed)
Subjective:    Patient ID: [name] [name], female    DOB: 1923-03-22, 84 y.o.   MRN: [id number]  HPI  11/05/17 Patient presents with her daughter today with 2 concerns.  First, there has been increased leg swelling over the last 6 days.  Patient only takes one fluid pill a day.  Over the last 6 days, her daughter has given her 2 fluid pills a day as well as 2 potassium tablets a day and the swelling in her legs has not improved.  However her weight is only 1 pound higher than it was in January.  Most of the swelling appears to be dependent edema in the lower extremities distal to the knees where the patient is not walking and sits the majority of the day with her feet in a dependent position.  She denies any chest pain or shortness of breath.  She does have faint bibasilar rales which are not extensive.  She is in atrial fibrillation on exam but her heart rate is well controlled.  She denies any orthopnea.  There is no evidence of JVD.  She does not appear to be fluid overloaded.  Second she presents with several weeks of off and on right lower quadrant pain.  She has a history of an appendectomy.  She denies any constipation.  She denies any dysuria or hematuria.  She denies any diarrhea.  She denies any blood in her stool.  Urinalysis today does show trace leukocyte esterase and trace blood and she is tender to palpation over the bladder.  There is no guarding or rebound.  At that time, my plan was: Patient has significant edema distal to the knee but majority of this appears to be dependent edema from the patient sitting the majority of the day and being unable to walk or stand without tremendous effort.  I am concerned about possible dehydration and overuse of diuretics.  Therefore I will check a BMP along with a BNP to evaluate for signs of prerenal azotemia in a patient with known chronic kidney disease as well as signs of fluid overload.  If these are normal, I would recommend treating the leg  swelling with compression hose and elevation rather than increasing diuretics.  The right lower quadrant abdominal pain is vague and nonspecific.  Some of it seems to be muscular and related to the pain in her right hip as hip flexion makes the pain worse.  She also appears to have a possible urinary tract infection.  I will send a culture and treat this empirically with Bactrim double strength tablets twice a day for 5 days.  I will also obtain an x-ray of the abdomen to evaluate for constipation as this can frequently happen in a patient who is sedentary and elderly.  11/09/17 Workup was significant only for chronic kidney disease and an elevated BNP.  Right lower quadrant abdominal pain is now centered in the hip joint.  She has pain with flexion and it seems to be muscular in nature.  It is also better.  The leg swelling has improved dramatically.  I increased her Lasix from 20 mg once a day to 40 mg twice a day over the weekend and the patient diuresed and lost 3 pounds.  The edema in the legs has essentially resolved but she continues to have very large swollen distended varicose veins due to chronic venous insufficiency Past Medical History:  Diagnosis Date  . Aneurysm of aorta (HCC)   . Aneurysm, thoracic  aortic ([place])   . Aortic insufficiency   . Ascending aortic aneurysm ([place])    CT in 2012 - 4.2x4.2cm  . Atrial fibrillation ([place])   . Bladder infection    h/o  . Coronary artery disease   . 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 Dehydration    HISTORY   . Fall at home 09/10/12  . Hemorrhoids   . Hip fracture ([place])    hip surgery 2001  . History of blood clots    in leg  . History of knee surgery   . Hypertension   . IDA (iron deficiency anemia) 06/07/2013  . Iron deficiency anemia, unspecified 03/14/2013   secondary to GI blood loss  . Kidney infection    h/o  . Melanoma of skin ([place]) 03/14/2013  . Melanoma of thigh ([place])    left  . Mini stroke ([place])    . S/P colonoscopy March 2010   RMR: friable anal canal hemorrhoids, hyperplastic ascending polyp, adenomatous descending polyp   . Stomach ulcer    secondary to h.pylori, s/p treatment  . Thyroid condition   . Venous stasis    edema   Past Surgical History:  Procedure Laterality Date  . APPENDECTOMY  1942  . BREAST LUMPECTOMY  1998  . CARDIAC CATHETERIZATION    . CATARACT EXTRACTION, BILATERAL    . CHOLECYSTECTOMY    . COLONOSCOPY  06/05/11   pancolonic diverticulosis/ileal reosion/abnormal anorectal junction s/p biopsy: path for small intestine and Ti was benign with non-villous atrophy, rectal biopsy with prominent prolapse changes, no acute inflammation  . CORNEAL TRANSPLANT Bilateral 2013  . ENTEROSCOPY N/A 06/13/2013   ZOX:WRUE Gastritis/GI BLEED MOST LIKELY DUE TO DUODENAL ULCERS, AND ? AVMs  . ESOPHAGOGASTRODUODENOSCOPY  06/05/11   small hiatal hernia; + H.PYLORI GASTRITIS, s/p 5 days of Pylera, unable to finish due to N/V  . ESOPHAGOGASTRODUODENOSCOPY N/A 06/13/2013   Dr. Oneida Alar- see enteroscopy  . ESOPHAGOGASTRODUODENOSCOPY (EGD) WITH ESOPHAGEAL DILATION N/A 06/08/2013   [id number] hiatal hernia;  otherwise normal EGD s/p dilation  . GIVENS CAPSULE STUDY N/A 06/08/2013   Procedure: GIVENS CAPSULE STUDY;  Surgeon: Daneil Dolin, MD;  Location: [facility];  Service: Endoscopy;  Laterality: N/A;  . HEMORRHOID BANDING    . KNEE SURGERY Right 05/2006   total right  . LEFT HEART CATH AND CORONARY ANGIOGRAPHY N/A 03/18/2017   Procedure: LEFT HEART CATH AND CORONARY ANGIOGRAPHY;  Surgeon: Wellington Hampshire, MD;  Location: [facility];  Service: Cardiovascular;  Laterality: N/A;  . LEG SURGERY    . MELANOMA EXCISION Left 08/2006   left leg excision  . NM MYOCAR PERF WALL MOTION  03/27/2010   dipyridamole; small reversible basal to mid-septal defect (?artifact), post-stress EF 55%, low risk scan   . PARTIAL HYSTERECTOMY  1976  . TONSILLECTOMY    . TOTAL HIP ARTHROPLASTY  Left 2001&2004    X 2 FOR LEFT HIP  . TRANSTHORACIC ECHOCARDIOGRAM  10/06/2012   EF 55-60%, mod eccentric hypertrophy, grade 2 diastolic dysfunction; mildly calcifed AV annulus with moderate regurg; aortic root mildly dilated; LA severely dailted; PA peak pressure 59mHg  . VARICOSE VEIN SURGERY     Current Outpatient Medications on File Prior to Visit  Medication Sig Dispense Refill  . albuterol (PROAIR HFA) 108 (90 Base) MCG/ACT inhaler Inhale 1-2 puffs into the lungs every 6 (six) hours as needed for wheezing or shortness of breath.    . carvedilol (  COREG) 3.125 MG tablet Take 1 tablet (3.125 mg total) by mouth 2 (two) times daily with a meal. 180 tablet 3  . clopidogrel (PLAVIX) 75 MG tablet Take 1 tablet (75 mg total) by mouth daily with breakfast. 90 tablet 3  . cyanocobalamin ([employer] VITAMIN B12) 2000 MCG tablet Take 1 tablet (2,000 mcg total) by mouth daily. 30 tablet 11  . folic acid (FOLVITE) 1 MG tablet Take 1 tablet (1 mg total) by mouth daily. 30 tablet 11  . furosemide (LASIX) 20 MG tablet Take 1 tablet (20 mg total) by mouth 2 (two) times daily. (Patient taking differently: Take 20 mg by mouth daily. ) 180 tablet 1  . gabapentin (NEURONTIN) 100 MG capsule TAKE 2 CAPSULES BY MOUTH 3  TIMES DAILY 540 capsule 1  . hydrocortisone (ANUSOL-HC) 25 MG suppository Place 1 suppository (25 mg total) rectally every 12 (twelve) hours. (Patient taking differently: Place 25 mg rectally 2 (two) times daily. ) 12 suppository 1  . mometasone (ELOCON) 0.1 % cream Apply 1 application topically daily. 45 g 0  . mupirocin ointment (BACTROBAN) 2 % Place 1 application into the nose 2 (two) times daily.    . ondansetron (ZOFRAN) 4 MG tablet TAKE 1 TABLET BY MOUTH  EVERY 8 HOURS AS NEEDED FOR NAUSEA AND VOMITING 60 tablet 0  . pantoprazole (PROTONIX) 40 MG tablet Take 1 tablet (40 mg total) by mouth 2 (two) times daily. 60 tablet 11  . potassium chloride SA (K-DUR,KLOR-CON) 20 MEQ tablet Take 1 tablet (20 mEq  total) by mouth daily. 90 tablet 3  . prednisoLONE acetate (PRED FORTE) 1 % ophthalmic suspension Place 1 drop into both eyes 4 (four) times daily.     . psyllium (METAMUCIL) 58.6 % packet Take 1 packet by mouth daily as needed (for constipation).     . sodium chloride (MURO 128) 2 % ophthalmic solution Place 1 drop into both eyes 3 (three) times daily.     Marland Kitchen sulfamethoxazole-trimethoprim (BACTRIM DS,SEPTRA DS) 800-160 MG tablet Take 1 tablet by mouth 2 (two) times daily. 10 tablet 0  . traMADol-acetaminophen (ULTRACET) 37.5-325 MG tablet TAKE 1 TABLET BY MOUTH EVERY 4 HOURS AS NEEDED FOR PAIN 180 tablet 0   No current facility-administered medications on file prior to visit.    Allergies  Allergen Reactions  . Codeine Diarrhea and Nausea Only  . Tape Other (See Comments)    SKIN IS VERY THIN AND TEARS AND BRUISES EASILY; PLEASE USE COBAN WRAP!!  . Diona Fanti [Aspirin] Rash and Other (See Comments)    Petechia   . Iron Nausea And Vomiting    Oral iron causes nausea and vomiting  . Penicillins Rash    Has patient had a PCN reaction causing immediate rash, facial/tongue/throat swelling, SOB or lightheadedness with hypotension: Yes Has patient had a PCN reaction causing severe rash involving mucus membranes or skin necrosis: Unknown Has patient had a PCN reaction that required hospitalization: No Has patient had a PCN reaction occurring within the last 10 years: No If all of the above answers are "NO", then may proceed with Cephalosporin use.    Social History   Socioeconomic History  . Marital status: Widowed    Spouse name: Not on file  . Number of children: 5  . Years of education: 6  . Highest education level: Not on file  Occupational History    Employer: RETIRED  Social Needs  . Financial resource strain: Not on file  . Food insecurity:  Worry: Not on file    Inability: Not on file  . Transportation needs:    Medical: Not on file    Non-medical: Not on file  Tobacco Use  .  Smoking status: Former Smoker    Packs/day: 1.50    Years: 20.00    Pack years: 30.00    Types: Cigarettes    Last attempt to quit: 07/05/1975    Years since quitting: 42.3  . Smokeless tobacco: Never Used  . Tobacco comment: Quit smoking in 1975  Substance and Sexual Activity  . Alcohol use: No  . Drug use: No  . Sexual activity: Never  Lifestyle  . Physical activity:    Days per week: Not on file    Minutes per session: Not on file  . Stress: Not on file  Relationships  . Social connections:    Talks on phone: Not on file    Gets together: Not on file    Attends religious service: Not on file    Active member of club or organization: Not on file    Attends meetings of clubs or organizations: Not on file    Relationship status: Not on file  . Intimate partner violence:    Fear of current or ex partner: Not on file    Emotionally abused: Not on file    Physically abused: Not on file    Forced sexual activity: Not on file  Other Topics Concern  . Not on file  Social History Narrative  . Not on file      Review of Systems  All other systems reviewed and are negative.      Objective:   Physical Exam  Constitutional: She appears well-developed and well-nourished.  Neck: No JVD present.  Cardiovascular: Normal rate and normal heart sounds. An irregularly irregular rhythm present.  Pulmonary/Chest: Effort normal. No respiratory distress. She has no wheezes. She has rales in the left lower field. She exhibits no tenderness.  Abdominal: Soft. Bowel sounds are normal. She exhibits no distension and no mass. There is tenderness. There is no rebound and no guarding.  Musculoskeletal: She exhibits edema.  Lymphadenopathy:    She has no cervical adenopathy.  Vitals reviewed.         Assessment & Plan:  Leg swelling - Plan: BASIC METABOLIC PANEL WITH GFR  Chronic systolic congestive heart failure (HCC) Leg swelling is better.  It is likely multifactorial and due to a  combination of congestive heart failure, chronic kidney disease, and chronic venous insufficiency.  To avoid dehydration as well as hypotension, I have recommended decreasing dose of Lasix to 40 mg once a day.  I will recheck her kidney function.  And I have recommended the patient wear compression hose that are knee-high every day to help control the leg swelling.  I believe the right lower quadrant abdominal pain is likely musculoskeletal or and requires no further workup at this time.  It is likely a muscle strain.  Patient's lab work was also significant for anemia.  Hemoglobin had fallen from 12.6-10.5.  Further lab work shows microcytic anemia.  Given the patient's advanced age, family declines a workup for an occult GI bleed.  Instead they would like to try symptomatic therapy with iron sulfate 325 mg p.o. daily and recheck a CBC in 1 month

## 2017-11-10 ENCOUNTER — Encounter: Payer: Self-pay | Admitting: Family Medicine

## 2017-11-10 ENCOUNTER — Other Ambulatory Visit: Payer: Self-pay | Admitting: Family Medicine

## 2017-11-10 ENCOUNTER — Encounter (INDEPENDENT_AMBULATORY_CARE_PROVIDER_SITE_OTHER): Payer: Self-pay

## 2017-11-10 DIAGNOSIS — N289 Disorder of kidney and ureter, unspecified: Secondary | ICD-10-CM

## 2017-11-10 NOTE — Telephone Encounter (Signed)
Requesting refill    ultracet  LOV: 11/09/17  LRF:  10/05/17

## 2017-11-12 ENCOUNTER — Other Ambulatory Visit: Payer: Medicare Other

## 2017-11-12 DIAGNOSIS — N289 Disorder of kidney and ureter, unspecified: Secondary | ICD-10-CM

## 2017-11-12 LAB — COMPREHENSIVE METABOLIC PANEL
AG RATIO: 1.7 (calc) (ref 1.0–2.5)
ALKALINE PHOSPHATASE (APISO): 64 U/L (ref 33–130)
ALT: 6 U/L (ref 6–29)
AST: 16 U/L (ref 10–35)
Albumin: 3.8 g/dL (ref 3.6–5.1)
BUN / CREAT RATIO: 16 (calc) (ref 6–22)
BUN: 31 mg/dL — ABNORMAL HIGH (ref 7–25)
CALCIUM: 8.4 mg/dL — AB (ref 8.6–10.4)
CHLORIDE: 104 mmol/L (ref 98–110)
CO2: 28 mmol/L (ref 20–32)
CREATININE: 1.95 mg/dL — AB (ref 0.60–0.88)
GLUCOSE: 93 mg/dL (ref 65–99)
Globulin: 2.3 g/dL (calc) (ref 1.9–3.7)
POTASSIUM: 4.4 mmol/L (ref 3.5–5.3)
Sodium: 140 mmol/L (ref 135–146)
Total Bilirubin: 0.6 mg/dL (ref 0.2–1.2)
Total Protein: 6.1 g/dL (ref 6.1–8.1)

## 2017-11-13 ENCOUNTER — Encounter (INDEPENDENT_AMBULATORY_CARE_PROVIDER_SITE_OTHER): Payer: Self-pay

## 2017-11-13 ENCOUNTER — Other Ambulatory Visit (HOSPITAL_COMMUNITY): Payer: Self-pay | Admitting: Oncology

## 2017-11-13 ENCOUNTER — Other Ambulatory Visit: Payer: Self-pay | Admitting: Family Medicine

## 2017-11-13 DIAGNOSIS — R7989 Other specified abnormal findings of blood chemistry: Secondary | ICD-10-CM

## 2017-11-13 DIAGNOSIS — E7211 Homocystinuria: Principal | ICD-10-CM

## 2017-11-13 DIAGNOSIS — N289 Disorder of kidney and ureter, unspecified: Secondary | ICD-10-CM

## 2017-11-24 ENCOUNTER — Ambulatory Visit (HOSPITAL_COMMUNITY)
Admission: RE | Admit: 2017-11-24 | Discharge: 2017-11-24 | Disposition: A | Discharge status: Home / Self Care | Payer: Medicare Other | Source: Ambulatory Visit | Attending: Family Medicine | Admitting: Family Medicine

## 2017-11-24 DIAGNOSIS — R1031 Right lower quadrant pain: Secondary | ICD-10-CM | POA: Diagnosis not present

## 2017-11-25 ENCOUNTER — Other Ambulatory Visit: Payer: Medicare Other

## 2017-11-25 DIAGNOSIS — N289 Disorder of kidney and ureter, unspecified: Secondary | ICD-10-CM

## 2017-11-25 LAB — COMPREHENSIVE METABOLIC PANEL
AG Ratio: 1.6 (calc) (ref 1.0–2.5)
ALBUMIN MSPROF: 3.6 g/dL (ref 3.6–5.1)
ALKALINE PHOSPHATASE (APISO): 62 U/L (ref 33–130)
ALT: 6 U/L (ref 6–29)
AST: 15 U/L (ref 10–35)
BUN/Creatinine Ratio: 17 (calc) (ref 6–22)
BUN: 25 mg/dL (ref 7–25)
CO2: 29 mmol/L (ref 20–32)
CREATININE: 1.44 mg/dL — AB (ref 0.60–0.88)
Calcium: 8.2 mg/dL — ABNORMAL LOW (ref 8.6–10.4)
Chloride: 106 mmol/L (ref 98–110)
Globulin: 2.3 g/dL (calc) (ref 1.9–3.7)
Glucose, Bld: 106 mg/dL — ABNORMAL HIGH (ref 65–99)
Potassium: 4.5 mmol/L (ref 3.5–5.3)
SODIUM: 141 mmol/L (ref 135–146)
TOTAL PROTEIN: 5.9 g/dL — AB (ref 6.1–8.1)
Total Bilirubin: 0.6 mg/dL (ref 0.2–1.2)

## 2017-11-26 ENCOUNTER — Encounter (INDEPENDENT_AMBULATORY_CARE_PROVIDER_SITE_OTHER): Payer: Self-pay

## 2017-11-26 DIAGNOSIS — H353131 Nonexudative age-related macular degeneration, bilateral, early dry stage: Secondary | ICD-10-CM | POA: Diagnosis not present

## 2017-12-01 ENCOUNTER — Emergency Department (HOSPITAL_COMMUNITY)
Admission: EM | Admit: 2017-12-01 | Discharge: 2017-12-01 | Disposition: A | Discharge status: Home / Self Care | Payer: Medicare Other | Attending: Emergency Medicine | Admitting: Emergency Medicine

## 2017-12-01 ENCOUNTER — Encounter (HOSPITAL_COMMUNITY): Payer: Self-pay | Admitting: *Deleted

## 2017-12-01 ENCOUNTER — Other Ambulatory Visit: Payer: Self-pay

## 2017-12-01 DIAGNOSIS — Z5321 Procedure and treatment not carried out due to patient leaving prior to being seen by health care provider: Secondary | ICD-10-CM | POA: Insufficient documentation

## 2017-12-01 DIAGNOSIS — R0602 Shortness of breath: Secondary | ICD-10-CM | POA: Insufficient documentation

## 2017-12-01 DIAGNOSIS — R600 Localized edema: Secondary | ICD-10-CM | POA: Diagnosis not present

## 2017-12-01 NOTE — ED Triage Notes (Signed)
Pt reports bilateral lle since earlier today.

## 2017-12-01 NOTE — ED Notes (Signed)
Per registration, the pt and family wants to leave. This RN advised that the pt was next to be seen. Pt and family left anyway.

## 2017-12-02 ENCOUNTER — Ambulatory Visit (INDEPENDENT_AMBULATORY_CARE_PROVIDER_SITE_OTHER): Payer: Medicare Other | Admitting: Student

## 2017-12-02 ENCOUNTER — Encounter: Payer: Self-pay | Admitting: Student

## 2017-12-02 VITALS — BP 110/62 | HR 67 | Ht 65.0 in | Wt 163.0 lb

## 2017-12-02 DIAGNOSIS — I2510 Atherosclerotic heart disease of native coronary artery without angina pectoris: Secondary | ICD-10-CM | POA: Diagnosis not present

## 2017-12-02 DIAGNOSIS — N183 Chronic kidney disease, stage 3 unspecified: Secondary | ICD-10-CM

## 2017-12-02 DIAGNOSIS — I5042 Chronic combined systolic (congestive) and diastolic (congestive) heart failure: Secondary | ICD-10-CM

## 2017-12-02 DIAGNOSIS — I712 Thoracic aortic aneurysm, without rupture: Secondary | ICD-10-CM

## 2017-12-02 DIAGNOSIS — I482 Chronic atrial fibrillation, unspecified: Secondary | ICD-10-CM

## 2017-12-02 DIAGNOSIS — I7121 Aneurysm of the ascending aorta, without rupture: Secondary | ICD-10-CM

## 2017-12-02 NOTE — Patient Instructions (Signed)
Medication Instructions:  Your physician recommends that you continue on your current medications as directed. Please refer to the Current Medication list given to you today. Take Lasix 40 mg Daily   Labwork: NONE   Testing/Procedures: NONE   Follow-Up: Your physician recommends that you schedule a follow-up appointment in: 3-4 Weeks    Any Other Special Instructions Will Be Listed Below (If Applicable).     If you need a refill on your cardiac medications before your next appointment, please call your pharmacy.  Thank you for choosing [facility]!

## 2017-12-02 NOTE — Progress Notes (Signed)
Cardiology Office Note    Date:  12/02/2017   ID:  [name], [name] [birth year], MRN [id number]  PCP:  [name] Frizzle, MD  Cardiologist: [name] Sable, MD    Chief Complaint  Patient presents with  . Follow-up    worsening edema    History of Present Illness:    [name] [name] is a 84 y.o. female past medical history of CAD (nonobstructive CAD by cath in 03/2017), chronic atrial fibrillation (not on anticoagulation due to prior GIB), chronic combined systolic and diastolic CHF (EF 46-96% by echo in 03/2017), aortic root dilation, aortic regurgitation, and Stage 3 CKD who presents to the office today for evaluation of worsening lower extremity edema.  She was last examined by Dr. Bronson [name] in 08/2017 and reported overall doing well from a cardiac perspective at that time. Weight was stable at 163 lbs and she was continued on Lasix 20 mg daily along with Carvedilol 3.125 mg twice daily. She has not been placed on anticoagulation due to a history of GI bleeding and has remained on Plavix due to history of TIA's and aspirin intolerance.  In talking with the patient and her two daughters today, she reports worsening lower extremity edema over the past several days. She had been evaluated by her PCP in 11/2017 for similar symptoms and Lasix was titrated from 40 mg daily to 40 mg twice daily with a subsequent increase in kidney function, therefore this was reduced back to 40 mg daily. Her daughter was confused about the medication regimen and has actually only been giving her 20 mg daily for the past week.  She has noticed worsening lower extremity edema but denies any associated dyspnea on exertion, orthopnea, or PND. No recent chest discomfort or palpitations.  She continues to live by herself in an apartment but her family is nearby and visits on a daily basis and also manages her medications.  Past Medical History:  Diagnosis Date  . Aneurysm of aorta (HCC)   . Aneurysm,  thoracic aortic ([place])   . Aortic insufficiency   . Ascending aortic aneurysm ([place])    CT in 2012 - 4.2x4.2cm  . Atrial fibrillation ([place])    a. not on anticoagulation due to prior GIB  . Bladder infection    h/o  . Coronary artery disease   . 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 Dehydration    HISTORY   . Fall at home 09/10/12  . Hemorrhoids   . Hip fracture ([place])    hip surgery 2001  . History of blood clots    in leg  . History of knee surgery   . Hypertension   . IDA (iron deficiency anemia) 06/07/2013  . Iron deficiency anemia, unspecified 03/14/2013   secondary to GI blood loss  . Kidney infection    h/o  . Melanoma of skin ([place]) 03/14/2013  . Melanoma of thigh ([place])    left  . Mini stroke ([place])   . Nonischemic cardiomyopathy ([place])    a. EF 35-40% by echo in 03/2017 with cath showing nonobstructive CAD  . S/P colonoscopy March 2010   RMR: friable anal canal hemorrhoids, hyperplastic ascending polyp, adenomatous descending polyp   . Stomach ulcer    secondary to h.pylori, s/p treatment  . Thyroid condition   . Venous stasis    edema    Past Surgical History:  Procedure Laterality Date  . APPENDECTOMY  1942  . BREAST  LUMPECTOMY  1998  . CARDIAC CATHETERIZATION    . CATARACT EXTRACTION, BILATERAL    . CHOLECYSTECTOMY    . COLONOSCOPY  06/05/11   pancolonic diverticulosis/ileal reosion/abnormal anorectal junction s/p biopsy: path for small intestine and Ti was benign with non-villous atrophy, rectal biopsy with prominent prolapse changes, no acute inflammation  . CORNEAL TRANSPLANT Bilateral 2013  . ENTEROSCOPY N/A 06/13/2013   MVE:HMCN Gastritis/GI BLEED MOST LIKELY DUE TO DUODENAL ULCERS, AND ? AVMs  . ESOPHAGOGASTRODUODENOSCOPY  06/05/11   small hiatal hernia; + H.PYLORI GASTRITIS, s/p 5 days of Pylera, unable to finish due to N/V  . ESOPHAGOGASTRODUODENOSCOPY N/A 06/13/2013   Dr. Oneida Alar- see enteroscopy  .  ESOPHAGOGASTRODUODENOSCOPY (EGD) WITH ESOPHAGEAL DILATION N/A 06/08/2013   OBS:JGGEZ hiatal hernia;  otherwise normal EGD s/p dilation  . GIVENS CAPSULE STUDY N/A 06/08/2013   Procedure: GIVENS CAPSULE STUDY;  Surgeon: Daneil Dolin, MD;  [facility];  Service: Endoscopy;  Laterality: N/A;  . HEMORRHOID BANDING    . KNEE SURGERY Right 05/2006   total right  . LEFT HEART CATH AND CORONARY ANGIOGRAPHY N/A 03/18/2017   Procedure: LEFT HEART CATH AND CORONARY ANGIOGRAPHY;  Surgeon: Wellington Hampshire, MD;  Location: [facility];  Service: Cardiovascular;  Laterality: N/A;  . LEG SURGERY    . MELANOMA EXCISION Left 08/2006   left leg excision  . NM MYOCAR PERF WALL MOTION  03/27/2010   dipyridamole; small reversible basal to mid-septal defect (?artifact), post-stress EF 55%, low risk scan   . PARTIAL HYSTERECTOMY  1976  . TONSILLECTOMY    . TOTAL HIP ARTHROPLASTY Left 2001&2004    X 2 FOR LEFT HIP  . TRANSTHORACIC ECHOCARDIOGRAM  10/06/2012   EF 55-60%, mod eccentric hypertrophy, grade 2 diastolic dysfunction; mildly calcifed AV annulus with moderate regurg; aortic root mildly dilated; LA severely dailted; PA peak pressure 81mHg  . VARICOSE VEIN SURGERY      Current Medications: Outpatient Medications Prior to Visit  Medication Sig Dispense Refill  . carvedilol (COREG) 3.125 MG tablet Take 1 tablet (3.125 mg total) by mouth 2 (two) times daily with a meal. 180 tablet 3  . clopidogrel (PLAVIX) 75 MG tablet Take 1 tablet (75 mg total) by mouth daily with breakfast. 90 tablet 3  . cyanocobalamin ([employer] VITAMIN B12) 2000 MCG tablet Take 1 tablet (2,000 mcg total) by mouth daily. 30 tablet 11  . folic acid (FOLVITE) 1 MG tablet TAKE 1 TABLET BY MOUTH EVERY DAY 30 tablet 11  . furosemide (LASIX) 40 MG tablet Take 40 mg by mouth daily.    Marland Kitchen gabapentin (NEURONTIN) 100 MG capsule TAKE 2 CAPSULES BY MOUTH 3  TIMES DAILY 540 capsule 1  . hydrocortisone (ANUSOL-HC) 25 MG suppository Place 1  suppository (25 mg total) rectally every 12 (twelve) hours. (Patient taking differently: Place 25 mg rectally 2 (two) times daily. ) 12 suppository 1  . mometasone (ELOCON) 0.1 % cream Apply 1 application topically daily. 45 g 0  . ondansetron (ZOFRAN) 4 MG tablet TAKE 1 TABLET BY MOUTH  EVERY 8 HOURS AS NEEDED FOR NAUSEA AND VOMITING 60 tablet 0  . pantoprazole (PROTONIX) 40 MG tablet Take 1 tablet (40 mg total) by mouth 2 (two) times daily. 60 tablet 11  . potassium chloride SA (K-DUR,KLOR-CON) 20 MEQ tablet Take 1 tablet (20 mEq total) by mouth daily. 90 tablet 3  . prednisoLONE acetate (PRED FORTE) 1 % ophthalmic suspension Place 1 drop into both eyes 4 (four) times  daily.     . psyllium (METAMUCIL) 58.6 % packet Take 1 packet by mouth daily as needed (for constipation).     . sodium chloride (MURO 128) 2 % ophthalmic solution Place 1 drop into both eyes 3 (three) times daily.     . traMADol-acetaminophen (ULTRACET) 37.5-325 MG tablet TAKE 1 TABLET BY MOUTH EVERY 4 HOURS AS NEEDED FOR PAIN 180 tablet 0  . albuterol (PROAIR HFA) 108 (90 Base) MCG/ACT inhaler Inhale 1-2 puffs into the lungs every 6 (six) hours as needed for wheezing or shortness of breath.    . furosemide (LASIX) 20 MG tablet Take 1 tablet (20 mg total) by mouth 2 (two) times daily. (Patient taking differently: Take 20 mg by mouth daily. ) 180 tablet 1  . mupirocin ointment (BACTROBAN) 2 % Place 1 application into the nose 2 (two) times daily.    Marland Kitchen sulfamethoxazole-trimethoprim (BACTRIM DS,SEPTRA DS) 800-160 MG tablet Take 1 tablet by mouth 2 (two) times daily. 10 tablet 0   No facility-administered medications prior to visit.      Allergies:   Codeine; Tape; Asa [aspirin]; Iron; and Penicillins   Social History   Socioeconomic History  . Marital status: Widowed    Spouse name: Not on file  . Number of children: 5  . Years of education: 6  . Highest education level: Not on file  Occupational History    Employer: RETIRED   Social Needs  . Financial resource strain: Not on file  . Food insecurity:    Worry: Not on file    Inability: Not on file  . Transportation needs:    Medical: Not on file    Non-medical: Not on file  Tobacco Use  . Smoking status: Former Smoker    Packs/day: 1.50    Years: 20.00    Pack years: 30.00    Types: Cigarettes    Last attempt to quit: 07/05/1975    Years since quitting: 42.4  . Smokeless tobacco: Never Used  . Tobacco comment: Quit smoking in 1975  Substance and Sexual Activity  . Alcohol use: No  . Drug use: No  . Sexual activity: Never  Lifestyle  . Physical activity:    Days per week: Not on file    Minutes per session: Not on file  . Stress: Not on file  Relationships  . Social connections:    Talks on phone: Not on file    Gets together: Not on file    Attends religious service: Not on file    Active member of club or organization: Not on file    Attends meetings of clubs or organizations: Not on file    Relationship status: Not on file  Other Topics Concern  . Not on file  Social History Narrative  . Not on file     Family History:  The patient's family history includes Asthma in her mother; Breast cancer in her daughter and daughter; Hyperlipidemia in her child; Multiple sclerosis in her child.   Review of Systems:   Please see the history of present illness.     General:  No chills, fever, night sweats or weight changes.  Cardiovascular:  No chest pain, dyspnea on exertion, orthopnea, palpitations, paroxysmal nocturnal dyspnea. Positive for lower extremity edema.  Dermatological: No rash, lesions/masses Respiratory: No cough, dyspnea Urologic: No hematuria, dysuria Abdominal:   No nausea, vomiting, diarrhea, bright red blood per rectum, melena, or hematemesis Neurologic:  No visual changes, wkns, changes in mental status. All  other systems reviewed and are otherwise negative except as noted above.   Physical Exam:    VS:  BP 110/62    Pulse 67   Ht 5\' 5"  (1.651 m)   Wt 163 lb (73.9 kg)   SpO2 95%   BMI 27.12 kg/m    General: Well developed, elderly Caucasian female appearing in no acute distress. Head: Normocephalic, atraumatic, sclera non-icteric, no xanthomas, nares are without discharge.  Neck: No carotid bruits. JVD not elevated.  Lungs: Respirations regular and unlabored, without wheezes or rales.  Heart: Irregularly irregular. No S3 or S4.  No rubs or gallops appreciated. 2/6 diastolic murmur along RUSB.  Abdomen: Soft, non-tender, non-distended with normoactive bowel sounds. No hepatomegaly. No rebound/guarding. No obvious abdominal masses. Msk:  Strength and tone appear normal for age. No joint deformities or effusions. Extremities: No clubbing or cyanosis. 1+ pitting edema up to knees bilaterally.  Distal pedal pulses are 2+ bilaterally. Neuro: Alert and oriented X 3. Moves all extremities spontaneously. No focal deficits noted. Psych:  Responds to questions appropriately with a normal affect. Skin: No rashes or lesions noted  Wt Readings from Last 3 Encounters:  12/02/17 163 lb (73.9 kg)  12/01/17 165 lb (74.8 kg)  11/09/17 160 lb (72.6 kg)     Studies/Labs Reviewed:   EKG:  EKG is not ordered today.  Recent Labs: 04/05/2017: Magnesium 2.0 11/05/2017: Brain Natriuretic Peptide 559; Hemoglobin 10.5; Platelets 180 11/25/2017: ALT 6; BUN 25; Creat 1.44; Potassium 4.5; Sodium 141   Lipid Panel    Component Value Date/Time   CHOL 189 03/17/2017 0150   TRIG 73 03/17/2017 0150   HDL 55 03/17/2017 0150   CHOLHDL 3.4 03/17/2017 0150   VLDL 15 03/17/2017 0150   LDLCALC 119 (H) 03/17/2017 0150    Additional studies/ records that were reviewed today include:   Echocardiogram: 03/2017 Study Conclusions  - Left ventricle: The cavity size was normal. Systolic function was   moderately reduced. The estimated ejection fraction was in the   range of 35% to 40%. Dyskinesis of the mid-apicalanteroseptal and    anterior myocardium; consistent with infarction in the   distribution of the left anterior descending coronary artery. No   evidence of thrombus. - Aortic valve: There was mild to moderate regurgitation directed   towards the mitral anterior leaflet. - Ascending aorta: The ascending aorta was mildly dilated. - Mitral valve: Calcified annulus. There was mild to moderate   regurgitation directed centrally. - Left atrium: The atrium was moderately to severely dilated. - Right atrium: The atrium was mildly dilated. - Tricuspid valve: There was moderate regurgitation. - Pulmonary arteries: Systolic pressure was mildly increased. PA   peak pressure: 42 mm Hg (S).  Cardiac Catheterization: 03/2017 1.mild nonobstructive coronary artery disease. 2. Normal left ventricular end-diastolic pressure. Left ventricular angiography was not performed due to mildly elevated creatinine.  Recommendations: I reviewed the echocardiogram images and I suspect that the patient has stress-induced cardiomyopathy. Continue treatment with carvedilol and consider adding a small dose ACE inhibitor/ARB if blood pressure tolerates and renal function remained stable. Consider switching Plavix to a NOAC given underlying atrial fibrillation.  Assessment:    1. Chronic combined systolic and diastolic heart failure ([place])   2. Coronary artery disease involving native coronary artery of native heart without angina pectoris   3. Atrial fibrillation, chronic ([place])   4. CKD (chronic kidney disease) stage 3, GFR 30-59 ml/min (HCC)      Plan:   In order of  problems listed above:  1. Chronic Combined Systolic and Diastolic CHF - the patient has a known reduced EF of 35-40% by echo in 03/2017 with cath showing minimal CAD, felt to be most consistent with a stress-induced cardiomyopathy.  - over the past several days, she has developed worsening lower extremity edema but denies any changes in her respiratory status. Her family  was confused about her medication regimen and she has only been taking Lasix 20mg  daily when she had actually been prescribed 40mg  daily. Will therefore plan to increase Lasix to 40mg  daily. I have asked her daughter to call the office in a few days to report on her symptoms. Would be hesitant to increase diuretic dosing significantly as this recently caused an AKI. She has been intolerant to compression stockings in the past due to pain associated with her varicose veins. Sodium and fluid restriction reviewed.  - continue Coreg. Unable to further titrate dosing or add ACE-I or ARB given soft BP (initially at 100/42, rechecked and improved to 110/62).   2. CAD - recent cardiac catheterization showed mild, nonobstructive CAD as outlined above.  - remains on Plavix (due to history of TIA's and ASA intolerance) and BB therapy.   3. Chronic Atrial Fibrillation - she denies any recent palpitations and HR is well-controlled in the 60's during today's visit.  - continue Coreg 3.125mg  BID for rate-control. No longer on anticoagulation given history of recurrent GIB. Remains on Plavix.   4. Ascending Aortic Aneurysm - measured 4.6 cm by CT Imaging in 03/2017. She does not wish to undergo any type of future invasive procedures in the setting of her advanced age. No plans for further testing at this time.   5. Stage 3 CKD - baseline creatinine 1.2 - 1.4. Recently peaked at 2.13 with changes in diuretic dosing. Improved to 1.44 on 11/25/2017. She has repeat labs scheduled with her PCP next week and will need a repeat BMET at that time. If this has not been obtained by the time of her follow-up Cardiology visit in 3 weeks, will need to be drawn to reassess kidney function and K+ levels.    Medication Adjustments/Labs and Tests Ordered: Current medicines are reviewed at length with the patient today.  Concerns regarding medicines are outlined above.  Medication changes, Labs and Tests ordered today are listed in  the Patient Instructions below. Patient Instructions  Medication Instructions:  Your physician recommends that you continue on your current medications as directed. Please refer to the Current Medication list given to you today. Take Lasix 40 mg Daily   Labwork: NONE   Testing/Procedures: NONE   Follow-Up: Your physician recommends that you schedule a follow-up appointment in: 3-4 Weeks   Any Other Special Instructions Will Be Listed Below (If Applicable).  If you need a refill on your cardiac medications before your next appointment, please call your pharmacy.  Thank you for choosing [facility]!    Signed, Erma Heritage, PA-C  12/02/2017 7:50 PM    [facility] [address] [place], [place] [address] Phone: [phone]

## 2017-12-04 ENCOUNTER — Telehealth: Payer: Self-pay | Admitting: Student

## 2017-12-04 DIAGNOSIS — Z79899 Other long term (current) drug therapy: Secondary | ICD-10-CM

## 2017-12-04 NOTE — Addendum Note (Signed)
Addended by: Barbarann Ehlers A on: 12/04/2017 11:14 AM   Modules accepted: Orders

## 2017-12-04 NOTE — Telephone Encounter (Signed)
Attempted to reach pt, [id number]

## 2017-12-04 NOTE — Telephone Encounter (Signed)
Patient's weight was 164 today. Was supposed to update office today. / tg

## 2017-12-04 NOTE — Telephone Encounter (Signed)
   Hard to tell with one home weight how this is trending as her weight has been variable at 163 - 165 lbs on the office scales. If edema is improving, would continue on Lasix at current dosing. If no improvement, can increase to 60mg  daily. Would need a repeat BMET next Monday or Tuesday if dose is further adjusted.   Thanks,  Erma Heritage, PA-C 12/04/2017, 10:47 AM Pager: [phone]

## 2017-12-04 NOTE — Telephone Encounter (Signed)
I will forward to provider. ?

## 2017-12-08 NOTE — Telephone Encounter (Signed)
12/08/17 I spoke with daughter, she will ask Dr.Pickard to draw a bmet at her lab apt with him on Thursday, 12/10/17

## 2017-12-10 ENCOUNTER — Encounter: Payer: Self-pay | Admitting: Family Medicine

## 2017-12-10 ENCOUNTER — Ambulatory Visit (INDEPENDENT_AMBULATORY_CARE_PROVIDER_SITE_OTHER): Payer: Medicare Other | Admitting: Family Medicine

## 2017-12-10 VITALS — BP 110/60 | HR 60 | Temp 97.9°F | Resp 18 | Ht 65.0 in | Wt 160.0 lb

## 2017-12-10 DIAGNOSIS — M7989 Other specified soft tissue disorders: Secondary | ICD-10-CM

## 2017-12-10 DIAGNOSIS — I2510 Atherosclerotic heart disease of native coronary artery without angina pectoris: Secondary | ICD-10-CM

## 2017-12-10 DIAGNOSIS — I82890 Acute embolism and thrombosis of other specified veins: Secondary | ICD-10-CM | POA: Diagnosis not present

## 2017-12-10 LAB — BASIC METABOLIC PANEL WITH GFR
BUN/Creatinine Ratio: 15 (calc) (ref 6–22)
BUN: 20 mg/dL (ref 7–25)
CALCIUM: 8.3 mg/dL — AB (ref 8.6–10.4)
CHLORIDE: 103 mmol/L (ref 98–110)
CO2: 30 mmol/L (ref 20–32)
CREATININE: 1.36 mg/dL — AB (ref 0.60–0.88)
GFR, EST AFRICAN AMERICAN: 39 mL/min/{1.73_m2} — AB (ref >=60)
GFR, EST NON AFRICAN AMERICAN: 33 mL/min/{1.73_m2} — AB (ref >=60)
Glucose, Bld: 87 mg/dL (ref 65–99)
Potassium: 4.3 mmol/L (ref 3.5–5.3)
Sodium: 141 mmol/L (ref 135–146)

## 2017-12-10 LAB — CBC WITH DIFFERENTIAL/PLATELET
BASOS PCT: 0.7 %
Basophils Absolute: 30 cells/uL (ref 0–200)
EOS PCT: 2.8 %
Eosinophils Absolute: 120 cells/uL (ref 15–500)
HEMATOCRIT: 34.9 % — AB (ref 35.0–45.0)
HEMOGLOBIN: 11.1 g/dL — AB (ref 11.7–15.5)
LYMPHS ABS: 1522 {cells}/uL (ref 850–3900)
MCH: 28.5 pg (ref 27.0–33.0)
MCHC: 31.8 g/dL — ABNORMAL LOW (ref 32.0–36.0)
MCV: 89.7 fL (ref 80.0–100.0)
MPV: 9.8 fL (ref 7.5–12.5)
Monocytes Relative: 10.5 %
NEUTROS ABS: 2176 {cells}/uL (ref 1500–7800)
Neutrophils Relative %: 50.6 %
Platelets: 154 10*3/uL (ref 140–400)
RBC: 3.89 10*6/uL (ref 3.80–5.10)
RDW: 16.6 % — ABNORMAL HIGH (ref 11.0–15.0)
Total Lymphocyte: 35.4 %
WBC: 4.3 10*3/uL (ref 3.8–10.8)
WBCMIX: 452 {cells}/uL (ref 200–950)

## 2017-12-10 NOTE — Progress Notes (Signed)
Subjective:    Patient ID: [name] [name], female    DOB: 10-Feb-1923, 84 y.o.   MRN: [id number]  HPI  When patient was in the hospital last fall, she had a venous ultrasound that revealed a superficial thrombosis in her right leg from her thigh to her foot.  IMPRESSION: 1. No evidence of DVT within either lower extremity. 2. Occlusive superficial venous thrombosis of the right superficial great saphenous vein from the thigh to ankle.   She has been complaining of increasing pain and swelling in the right leg.  She is extremely tender to touch over the superficial varicose veins in her anterior and posterior calf.  She is reporting significant pain in her calf as well as in her thigh.  She is also complaining of severe pain in her right hip and her lower back.  She does have +1 edema in the right leg and +1 edema in the left leg.  She is currently taking 40 mg of Lasix every day.  This is managing the swelling somewhat but recently she even went to the hospital with leg swelling but did not stay due to a prolonged weight.  She denies any chest pain shortness of breath or dyspnea on exertion.  Regarding the pain in her hip, and abdominal x-ray revealed moderate to severe osteoarthritis in the right hip.  She also has known degenerative disc disease in the lumbar spine that could be explaining her lower back pain.  However my question is if this pain in her right leg is due to the pain in her hip, lumbar radiculopathy, or possible extension of the superficial thrombosis into a deep vein thrombosis. Past Medical History:  Diagnosis Date  . Aneurysm of aorta (HCC)   . Aneurysm, thoracic aortic ([place])   . Aortic insufficiency   . Ascending aortic aneurysm ([place])    CT in 2012 - 4.2x4.2cm  . Atrial fibrillation ([place])    a. not on anticoagulation due to prior GIB  . Bladder infection    h/o  . Coronary artery disease   . 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  Dehydration    HISTORY   . Fall at home 09/10/12  . Hemorrhoids   . Hip fracture ([place])    hip surgery 2001  . History of blood clots    in leg  . History of knee surgery   . Hypertension   . IDA (iron deficiency anemia) 06/07/2013  . Iron deficiency anemia, unspecified 03/14/2013   secondary to GI blood loss  . Kidney infection    h/o  . Melanoma of skin ([place]) 03/14/2013  . Melanoma of thigh ([place])    left  . Mini stroke ([place])   . Nonischemic cardiomyopathy ([place])    a. EF 35-40% by echo in 03/2017 with cath showing nonobstructive CAD  . S/P colonoscopy March 2010   RMR: friable anal canal hemorrhoids, hyperplastic ascending polyp, adenomatous descending polyp   . Stomach ulcer    secondary to h.pylori, s/p treatment  . Thyroid condition   . Venous stasis    edema   Past Surgical History:  Procedure Laterality Date  . APPENDECTOMY  1942  . BREAST LUMPECTOMY  1998  . CARDIAC CATHETERIZATION    . CATARACT EXTRACTION, BILATERAL    . CHOLECYSTECTOMY    . COLONOSCOPY  06/05/11   pancolonic diverticulosis/ileal reosion/abnormal anorectal junction s/p biopsy: path for small intestine and Ti was benign with non-villous  atrophy, rectal biopsy with prominent prolapse changes, no acute inflammation  . CORNEAL TRANSPLANT Bilateral 2013  . ENTEROSCOPY N/A 06/13/2013   PJK:DTOI Gastritis/GI BLEED MOST LIKELY DUE TO DUODENAL ULCERS, AND ? AVMs  . ESOPHAGOGASTRODUODENOSCOPY  06/05/11   small hiatal hernia; + H.PYLORI GASTRITIS, s/p 5 days of Pylera, unable to finish due to N/V  . ESOPHAGOGASTRODUODENOSCOPY N/A 06/13/2013   Dr. Oneida Alar- see enteroscopy  . ESOPHAGOGASTRODUODENOSCOPY (EGD) WITH ESOPHAGEAL DILATION N/A 06/08/2013   [id number]:[id number] hiatal hernia;  otherwise normal EGD s/p dilation  . GIVENS CAPSULE STUDY N/A 06/08/2013   Procedure: GIVENS CAPSULE STUDY;  Surgeon: Daneil Dolin, MD;  [facility];  Service: Endoscopy;  Laterality: N/A;  . HEMORRHOID BANDING    . KNEE SURGERY  Right 05/2006   total right  . LEFT HEART CATH AND CORONARY ANGIOGRAPHY N/A 03/18/2017   Procedure: LEFT HEART CATH AND CORONARY ANGIOGRAPHY;  Surgeon: Wellington Hampshire, MD;  Location: [facility];  Service: Cardiovascular;  Laterality: N/A;  . LEG SURGERY    . MELANOMA EXCISION Left 08/2006   left leg excision  . NM MYOCAR PERF WALL MOTION  03/27/2010   dipyridamole; small reversible basal to mid-septal defect (?artifact), post-stress EF 55%, low risk scan   . PARTIAL HYSTERECTOMY  1976  . TONSILLECTOMY    . TOTAL HIP ARTHROPLASTY Left 2001&2004    X 2 FOR LEFT HIP  . TRANSTHORACIC ECHOCARDIOGRAM  10/06/2012   EF 55-60%, mod eccentric hypertrophy, grade 2 diastolic dysfunction; mildly calcifed AV annulus with moderate regurg; aortic root mildly dilated; LA severely dailted; PA peak pressure 34mHg  . VARICOSE VEIN SURGERY     Current Outpatient Medications on File Prior to Visit  Medication Sig Dispense Refill  . carvedilol (COREG) 3.125 MG tablet Take 1 tablet (3.125 mg total) by mouth 2 (two) times daily with a meal. 180 tablet 3  . clopidogrel (PLAVIX) 75 MG tablet Take 1 tablet (75 mg total) by mouth daily with breakfast. 90 tablet 3  . cyanocobalamin ([employer]S VITAMIN B12) 2000 MCG tablet Take 1 tablet (2,000 mcg total) by mouth daily. 30 tablet 11  . folic acid (FOLVITE) 1 MG tablet TAKE 1 TABLET BY MOUTH EVERY DAY 30 tablet 11  . furosemide (LASIX) 40 MG tablet Take 40 mg by mouth daily.    Marland Kitchen gabapentin (NEURONTIN) 100 MG capsule TAKE 2 CAPSULES BY MOUTH 3  TIMES DAILY 540 capsule 1  . hydrocortisone (ANUSOL-HC) 25 MG suppository Place 1 suppository (25 mg total) rectally every 12 (twelve) hours. (Patient taking differently: Place 25 mg rectally 2 (two) times daily. ) 12 suppository 1  . mometasone (ELOCON) 0.1 % cream Apply 1 application topically daily. 45 g 0  . ondansetron (ZOFRAN) 4 MG tablet TAKE 1 TABLET BY MOUTH  EVERY 8 HOURS AS NEEDED FOR NAUSEA AND VOMITING 60 tablet 0  .  pantoprazole (PROTONIX) 40 MG tablet Take 1 tablet (40 mg total) by mouth 2 (two) times daily. 60 tablet 11  . potassium chloride SA (K-DUR,KLOR-CON) 20 MEQ tablet Take 1 tablet (20 mEq total) by mouth daily. 90 tablet 3  . prednisoLONE acetate (PRED FORTE) 1 % ophthalmic suspension Place 1 drop into both eyes 4 (four) times daily.     . psyllium (METAMUCIL) 58.6 % packet Take 1 packet by mouth daily as needed (for constipation).     . sodium chloride (MURO 128) 2 % ophthalmic solution Place 1 drop into both eyes 3 (three) times daily.     Marland Kitchen  traMADol-acetaminophen (ULTRACET) 37.5-325 MG tablet TAKE 1 TABLET BY MOUTH EVERY 4 HOURS AS NEEDED FOR PAIN 180 tablet 0   No current facility-administered medications on file prior to visit.    Allergies  Allergen Reactions  . Codeine Diarrhea and Nausea Only  . Tape Other (See Comments)    SKIN IS VERY THIN AND TEARS AND BRUISES EASILY; PLEASE USE COBAN WRAP!!  . Diona Fanti [Aspirin] Rash and Other (See Comments)    Petechia   . Iron Nausea And Vomiting    Oral iron causes nausea and vomiting  . Penicillins Rash    Has patient had a PCN reaction causing immediate rash, facial/tongue/throat swelling, SOB or lightheadedness with hypotension: Yes Has patient had a PCN reaction causing severe rash involving mucus membranes or skin necrosis: Unknown Has patient had a PCN reaction that required hospitalization: No Has patient had a PCN reaction occurring within the last 10 years: No If all of the above answers are "NO", then may proceed with Cephalosporin use.    Social History   Socioeconomic History  . Marital status: Widowed    Spouse name: Not on file  . Number of children: 5  . Years of education: 6  . Highest education level: Not on file  Occupational History    Employer: RETIRED  Social Needs  . Financial resource strain: Not on file  . Food insecurity:    Worry: Not on file    Inability: Not on file  . Transportation needs:    Medical:  Not on file    Non-medical: Not on file  Tobacco Use  . Smoking status: Former Smoker    Packs/day: 1.50    Years: 20.00    Pack years: 30.00    Types: Cigarettes    Last attempt to quit: 07/05/1975    Years since quitting: 42.4  . Smokeless tobacco: Never Used  . Tobacco comment: Quit smoking in 1975  Substance and Sexual Activity  . Alcohol use: No  . Drug use: No  . Sexual activity: Never  Lifestyle  . Physical activity:    Days per week: Not on file    Minutes per session: Not on file  . Stress: Not on file  Relationships  . Social connections:    Talks on phone: Not on file    Gets together: Not on file    Attends religious service: Not on file    Active member of club or organization: Not on file    Attends meetings of clubs or organizations: Not on file    Relationship status: Not on file  . Intimate partner violence:    Fear of current or ex partner: Not on file    Emotionally abused: Not on file    Physically abused: Not on file    Forced sexual activity: Not on file  Other Topics Concern  . Not on file  Social History Narrative  . Not on file     Review of Systems  All other systems reviewed and are negative.      Objective:   Physical Exam  Constitutional: She appears well-developed and well-nourished.  Neck: Neck supple. No JVD present.  Cardiovascular: Normal rate, regular rhythm and normal heart sounds.  Pulmonary/Chest: Effort normal and breath sounds normal. No stridor. No respiratory distress. She has no wheezes. She has no rales.  Abdominal: Soft. Bowel sounds are normal.  Musculoskeletal: She exhibits edema.  Skin: No erythema.   Patient has a large tender varicose veins all  up and down her right leg.  There are palpable cords in her posterior right thigh and calf that are tender to palpation suggesting persistent superficial thrombosis.  She also has pitting edema in the right leg and significant pain in the right leg       Assessment &  Plan:  Leg swelling - Plan: CBC with Differential/Platelet, BASIC METABOLIC PANEL WITH GFR, US Venous Img Lower Bilateral  Superficial vein thrombosis - Plan: US Venous Img Lower Bilateral  As discussed under history of present illness, I would like to rule out a DVT in the right leg to ensure appropriate medical therapy.  Therefore I will schedule the patient for a venous ultrasound of both legs given her recent worsening leg swelling, her pain out of proportion to exam, and the palpable superficial thrombosed varicose veins

## 2017-12-12 ENCOUNTER — Other Ambulatory Visit: Payer: Self-pay | Admitting: Family Medicine

## 2017-12-14 NOTE — Telephone Encounter (Signed)
Requesting refill:     Ultracet  LOV: 12/10/17  LRF:  11/10/17

## 2017-12-18 ENCOUNTER — Other Ambulatory Visit: Payer: Self-pay

## 2017-12-18 ENCOUNTER — Other Ambulatory Visit: Payer: Self-pay | Admitting: Family Medicine

## 2017-12-18 ENCOUNTER — Ambulatory Visit (HOSPITAL_COMMUNITY)
Admission: RE | Admit: 2017-12-18 | Discharge: 2017-12-18 | Disposition: A | Discharge status: Home / Self Care | Payer: Medicare Other | Source: Ambulatory Visit | Attending: Family Medicine | Admitting: Family Medicine

## 2017-12-18 DIAGNOSIS — I82890 Acute embolism and thrombosis of other specified veins: Secondary | ICD-10-CM

## 2017-12-18 DIAGNOSIS — M7989 Other specified soft tissue disorders: Secondary | ICD-10-CM

## 2017-12-18 NOTE — Telephone Encounter (Signed)
Called [facility] and they wanted patient to come right over for ultrasound. Spoke with patient's daughter and she stated that she will try to get patient over but she does not know if she can.

## 2017-12-21 ENCOUNTER — Other Ambulatory Visit: Payer: Medicare Other

## 2017-12-21 ENCOUNTER — Other Ambulatory Visit: Payer: Self-pay | Admitting: Family Medicine

## 2017-12-22 ENCOUNTER — Encounter: Payer: Self-pay | Admitting: Physician Assistant

## 2017-12-22 NOTE — Progress Notes (Addendum)
Cardiology Office Note    Date:  12/23/2017  ID:  [name], [name] 1923-03-24, MRN [id number] PCP:  [name] Frizzle, MD  Cardiologist:  Dr. Bronson Ing  Chief Complaint: f/u CHF  History of Present Illness:  [name] [name] is a 84 y.o. female with history of CAD (nonobstructive CAD by cath in 03/2017), chronic atrial fibrillation (not on anticoagulation due to prior GIB), NICM/chronic combined systolic and diastolic CHF (EF 15-17% by echo in 03/2017), chronic venous stasis, ascending aortic aneurysm (4.6cm by imaging 03/2017, pt declined further mgmt), mild-moderate aortic regurgitation and mitral regurgitation, moderate tricuspid regurgitation, and Stage 3 CKD who presents to the office today to follow up CHF. Per Dr. Bronson Ing, she has not been placed on anticoagulation due to a history of GI bleeding and has remained on Plavix due to history of TIA's and aspirin intolerance.  Last echo 03/2017 showed EF 35-40%, dyskinesis of the mid-apicalanteroseptal and anterior myocardium; consistent with infarction in the distribution of the left anterior descending coronary artery, mild-mod AI/MR, mildly dilated ascending aorta, mod-severe LAE, mild RAE, mod TR, mildly increased PASP. Cardiac cath the next day showed mild nonobstrucive CAD; it was suspected the patient had stress-induced cardiomyopathy. When seen in f/u by [place] 12/02/17, the patient reported worsening lower extremity edema. She had been evaluated by her PCP in 11/2017 for similar symptoms and Lasix was titrated from 40 mg daily to 40 mg twice daily with a subsequent increase in kidney function, therefore this was reduced back to 40 mg daily. Her daughter was confused about the medication regimen and had actually only been giving her 20 mg daily for the past week. [place] recommended to increase Lasix to 40mg  daily and try compression hose. Last labs 12/10/17 showed Cr 1.36, K 4.3, Na 141, Hgb 11.1, 11/25/17 showed albumin  3.6.  She presents back for follow-up overall feeling better. She is very hard of hearing. She is accompanied by her daughter. The patient has some memory difficulty and defers to her daughter at times. It sounds like her edema is about the same as it was before, although daughter thinks it looks better and back to chronic baseline. It never goes fully down. No CP or significant SOB. No dizziness or syncope.  Daughter clarifies she's taking Lasix 40mg  daily in the form of 20mg  BID. She reports good UOP.  Past Medical History:  Diagnosis Date  . Aortic insufficiency   . Ascending aortic aneurysm ([place])    a. 4.6cm by CT 2018 - pt deferred any further evaluation/invasive management therefore follow-up deferred.  . Bladder infection    h/o  . Chronic atrial fibrillation (HCC)    a. not on anticoagulation due to prior GIB  . CKD (chronic kidney disease), stage III ([place])   . Coronary artery disease    a. mild nonobstructive by cath 2018.  Marland Kitchen DCIS (ductal carcinoma in situ) of breast 03/14/2013   right breast  . Dehydration    HISTORY   . Fall at home 09/10/12  . Hemorrhoids   . Hip fracture ([place])    hip surgery 2001  . History of blood clots    in leg  . History of knee surgery   . Hypertension   . Iron deficiency anemia, unspecified 03/14/2013   secondary to GI blood loss  . Kidney infection    h/o  . Melanoma of skin ([place]) 03/14/2013  . Melanoma of thigh ([place])    left  . Mini stroke ([place])   .  Mitral regurgitation   . Nonischemic cardiomyopathy ([place])    a. EF 35-40% by echo in 03/2017 with cath showing nonobstructive CAD, suspected stress-induced.  . S/P colonoscopy March 2010   RMR: friable anal canal hemorrhoids, hyperplastic ascending polyp, adenomatous descending polyp   . Stomach ulcer    secondary to h.pylori, s/p treatment  . Thyroid condition   . Tricuspid regurgitation   . Venous stasis    edema    Past Surgical History:  Procedure Laterality Date  . APPENDECTOMY   1942  . BREAST LUMPECTOMY  1998  . CARDIAC CATHETERIZATION    . CATARACT EXTRACTION, BILATERAL    . CHOLECYSTECTOMY    . COLONOSCOPY  06/05/11   pancolonic diverticulosis/ileal reosion/abnormal anorectal junction s/p biopsy: path for small intestine and Ti was benign with non-villous atrophy, rectal biopsy with prominent prolapse changes, no acute inflammation  . CORNEAL TRANSPLANT Bilateral 2013  . ENTEROSCOPY N/A 06/13/2013   SLH:TDSK Gastritis/GI BLEED MOST LIKELY DUE TO DUODENAL ULCERS, AND ? AVMs  . ESOPHAGOGASTRODUODENOSCOPY  06/05/11   small hiatal hernia; + H.PYLORI GASTRITIS, s/p 5 days of Pylera, unable to finish due to N/V  . ESOPHAGOGASTRODUODENOSCOPY N/A 06/13/2013   Dr. Oneida Alar- see enteroscopy  . ESOPHAGOGASTRODUODENOSCOPY (EGD) WITH ESOPHAGEAL DILATION N/A 06/08/2013   AJG:OTLXB hiatal hernia;  otherwise normal EGD s/p dilation  . GIVENS CAPSULE STUDY N/A 06/08/2013   Procedure: GIVENS CAPSULE STUDY;  Surgeon: Daneil Dolin, MD;  [facility];  Service: Endoscopy;  Laterality: N/A;  . HEMORRHOID BANDING    . KNEE SURGERY Right 05/2006   total right  . LEFT HEART CATH AND CORONARY ANGIOGRAPHY N/A 03/18/2017   Procedure: LEFT HEART CATH AND CORONARY ANGIOGRAPHY;  Surgeon: Wellington Hampshire, MD;  Location: [facility];  Service: Cardiovascular;  Laterality: N/A;  . LEG SURGERY    . MELANOMA EXCISION Left 08/2006   left leg excision  . NM MYOCAR PERF WALL MOTION  03/27/2010   dipyridamole; small reversible basal to mid-septal defect (?artifact), post-stress EF 55%, low risk scan   . PARTIAL HYSTERECTOMY  1976  . TONSILLECTOMY    . TOTAL HIP ARTHROPLASTY Left 2001&2004    X 2 FOR LEFT HIP  . TRANSTHORACIC ECHOCARDIOGRAM  10/06/2012   EF 55-60%, mod eccentric hypertrophy, grade 2 diastolic dysfunction; mildly calcifed AV annulus with moderate regurg; aortic root mildly dilated; LA severely dailted; PA peak pressure 40mHg  . VARICOSE VEIN SURGERY      Current  Medications: Current Meds  Medication Sig  . carvedilol (COREG) 3.125 MG tablet Take 1 tablet (3.125 mg total) by mouth 2 (two) times daily with a meal.  . clopidogrel (PLAVIX) 75 MG tablet Take 1 tablet (75 mg total) by mouth daily with breakfast.  . cyanocobalamin (CVS VITAMIN B12) 2000 MCG tablet Take 1 tablet (2,000 mcg total) by mouth daily.  . folic acid (FOLVITE) 1 MG tablet TAKE 1 TABLET BY MOUTH EVERY DAY  . furosemide (LASIX) 20 MG tablet TAKE 1 TABLET BY MOUTH TWO  TIMES DAILY  . furosemide (LASIX) 40 MG tablet Take 40 mg by mouth daily.  Marland Kitchen gabapentin (NEURONTIN) 100 MG capsule TAKE 2 CAPSULES BY MOUTH 3  TIMES DAILY  . hydrocortisone (ANUSOL-HC) 25 MG suppository Place 1 suppository (25 mg total) rectally every 12 (twelve) hours. (Patient taking differently: Place 25 mg rectally 2 (two) times daily. )  . mometasone (ELOCON) 0.1 % cream Apply 1 application topically daily.  . ondansetron (ZOFRAN) 4 MG  tablet TAKE 1 TABLET BY MOUTH  EVERY 8 HOURS AS NEEDED FOR NAUSEA AND VOMITING  . pantoprazole (PROTONIX) 40 MG tablet Take 1 tablet (40 mg total) by mouth 2 (two) times daily.  . potassium chloride SA (K-DUR,KLOR-CON) 20 MEQ tablet Take 1 tablet (20 mEq total) by mouth daily.  . prednisoLONE acetate (PRED FORTE) 1 % ophthalmic suspension Place 1 drop into both eyes 4 (four) times daily.   . psyllium (METAMUCIL) 58.6 % packet Take 1 packet by mouth daily as needed (for constipation).   . sodium chloride (MURO 128) 2 % ophthalmic solution Place 1 drop into both eyes 3 (three) times daily.   . traMADol-acetaminophen (ULTRACET) 37.5-325 MG tablet TAKE 1 TABLET BY MOUTH EVERY 4 HOURS AS NEEDED FOR PAIN    Allergies:   Codeine; Tape; Asa [aspirin]; Iron; and Penicillins   Social History   Socioeconomic History  . Marital status: Widowed    Spouse name: Not on file  . Number of children: 5  . Years of education: 6  . Highest education level: Not on file  Occupational History     Employer: RETIRED  Social Needs  . Financial resource strain: Not on file  . Food insecurity:    Worry: Not on file    Inability: Not on file  . Transportation needs:    Medical: Not on file    Non-medical: Not on file  Tobacco Use  . Smoking status: Former Smoker    Packs/day: 1.50    Years: 20.00    Pack years: 30.00    Types: Cigarettes    Last attempt to quit: 07/05/1975    Years since quitting: 42.4  . Smokeless tobacco: Never Used  . Tobacco comment: Quit smoking in 1975  Substance and Sexual Activity  . Alcohol use: No  . Drug use: No  . Sexual activity: Never  Lifestyle  . Physical activity:    Days per week: Not on file    Minutes per session: Not on file  . Stress: Not on file  Relationships  . Social connections:    Talks on phone: Not on file    Gets together: Not on file    Attends religious service: Not on file    Active member of club or organization: Not on file    Attends meetings of clubs or organizations: Not on file    Relationship status: Not on file  Other Topics Concern  . Not on file  Social History Narrative  . Not on file     Family History:  Family History  Problem Relation Age of Onset  . Breast cancer Daughter        also hyperlipidemia  . Breast cancer Daughter        also hyperlipidemia  . Asthma Mother   . Multiple sclerosis Child   . Hyperlipidemia Child   . Colon cancer Neg Hx    ROS:   Please see the history of present illness.  All other systems are reviewed and otherwise negative.    PHYSICAL EXAM:   VS:  BP (!) 104/52   Pulse 68   Ht 5\' 4"  (1.626 m)   Wt 160 lb (72.6 kg)   SpO2 94%   BMI 27.46 kg/m   BMI: Body mass index is 27.46 kg/m. GEN: Well nourished, well developed elderly WF, in no acute distress  HEENT: normocephalic, atraumatic Neck: no JVD, carotid bruits, or masses Cardiac: irregularly irregular, rate well controlled; no murmurs, rubs, or  gallops, 1+ BLE edema with varicosities noted Respiratory:   clear to auscultation bilaterally, normal work of breathing GI: soft, nontender, nondistended, + BS MS: no deformity or atrophy  Skin: warm and dry, no rash Neuro:  Alert and Oriented x 3, Strength and sensation are intact, follows commands Psych: euthymic mood, full affect  Wt Readings from Last 3 Encounters:  12/23/17 160 lb (72.6 kg)  12/10/17 160 lb (72.6 kg)  12/02/17 163 lb (73.9 kg)      Studies/Labs Reviewed:   EKG:  EKG was not ordered today  Recent Labs: 04/05/2017: Magnesium 2.0 11/05/2017: Brain Natriuretic Peptide 559 11/25/2017: ALT 6 12/10/2017: BUN 20; Creat 1.36; Hemoglobin 11.1; Platelets 154; Potassium 4.3; Sodium 141   Lipid Panel    Component Value Date/Time   CHOL 189 03/17/2017 0150   TRIG 73 03/17/2017 0150   HDL 55 03/17/2017 0150   CHOLHDL 3.4 03/17/2017 0150   VLDL 15 03/17/2017 0150   LDLCALC 119 (H) 03/17/2017 0150    Additional studies/ records that were reviewed today include: Summarized above.    ASSESSMENT & PLAN:   1. Chronic combined CHF/NICM - weight is down 3lb from recent office visit. This has been a tricky situation given her renal insufficiency, age, and softer BP. The patient isn't sure she's seen a difference in her edema but overall feels better. Today she feels like her usual self. LE venous duplex was negative. I suspect some of her edema is dependent from sitting. She also eats out for lunch daily and gets Meals on Wheels which is likely contributing to residual fluid overload. Reviewed 2g sodium restriction, 2L fluid restriction, daily weights with patient. Unfortunately her blood pressure is too soft to aggressively diurese her, or she would be at risk for falling or worsening renal decline. I don't think decreasing her beta blocker would help because she needs the heart rate control to improve filling time. She did not have good experiences in the past with compression hose. She has daughters that come over daily to check on her. I  educated her daughter on a trial of daily ACE wrapping up her legs first thing in the morning to try and push some of the fluid back to heart level. Will continue Lasix 20mg  BID for now. Warning sx reviewed. Pt instructed to call if swelling does not improve with ACE wraps or worsens. 2. Chronic atrial fib - rate controlled. Not on anticoag as above. 3. Mild CAD - continue surveillance for any anginal symptoms. Stable. 4. CKD stage III - stable by recent check.  Disposition: F/u with Dr. Bronson Ing in 3 mo.   Medication Adjustments/Labs and Tests Ordered: Current medicines are reviewed at length with the patient today.  Concerns regarding medicines are outlined above. Medication changes, Labs and Tests ordered today are summarized above and listed in the Patient Instructions accessible in Encounters.   Signed, Charlie Pitter, PA-C  12/23/2017 2:29 PM    [facility] Location in [place], [place] [address] Ph: [phone]; Fax [phone]

## 2017-12-23 ENCOUNTER — Ambulatory Visit (INDEPENDENT_AMBULATORY_CARE_PROVIDER_SITE_OTHER): Payer: Medicare Other | Admitting: Physician Assistant

## 2017-12-23 ENCOUNTER — Encounter: Payer: Self-pay | Admitting: Physician Assistant

## 2017-12-23 VITALS — BP 104/52 | HR 68 | Ht 64.0 in | Wt 160.0 lb

## 2017-12-23 DIAGNOSIS — I2510 Atherosclerotic heart disease of native coronary artery without angina pectoris: Secondary | ICD-10-CM

## 2017-12-23 DIAGNOSIS — I482 Chronic atrial fibrillation, unspecified: Secondary | ICD-10-CM

## 2017-12-23 DIAGNOSIS — I5042 Chronic combined systolic (congestive) and diastolic (congestive) heart failure: Secondary | ICD-10-CM | POA: Diagnosis not present

## 2017-12-23 DIAGNOSIS — N183 Chronic kidney disease, stage 3 unspecified: Secondary | ICD-10-CM

## 2017-12-23 NOTE — Telephone Encounter (Signed)
Patient had ultrasound on 12/18/17. See results.

## 2017-12-23 NOTE — Patient Instructions (Addendum)
For patients with congestive heart failure, we give them these special instructions:  1. Follow a low-salt diet - you are allowed no more than 2,000mg  of sodium per day. Watch your fluid intake. In general, you should not be taking in more than 2 liters of fluid per day (no more than 8 glasses per day). This includes sources of water in foods like soup, coffee, tea, milk, etc. 2. Weigh yourself on the same scale at same time of day and keep a log. 3. Call your doctor: (Anytime you feel any of the following symptoms)  - 3lb weight gain overnight or 5lb within a few days - Shortness of breath, with or without a dry hacking cough  - Swelling in the hands, feet or stomach  - If you have to sleep on extra pillows at night in order to breathe   IT IS IMPORTANT TO LET YOUR DOCTOR KNOW EARLY ON IF YOU ARE HAVING SYMPTOMS SO WE CAN HELP YOU!   Your physician recommends that you schedule a follow-up appointment in: 3 months with Ebony    Your physician recommends that you continue on your current medications as directed. Please refer to the Current Medication list given to you today.    If you need a refill on your cardiac medications before your next appointment, please call your pharmacy.    No lab work or tests ordered today     Thank you for choosing [facility] !

## 2018-01-13 ENCOUNTER — Other Ambulatory Visit: Payer: Self-pay | Admitting: Family Medicine

## 2018-01-13 NOTE — Telephone Encounter (Signed)
Ok to refill??  Last office visit  12/10/2017.  Last refill 12/14/2017.

## 2018-01-19 ENCOUNTER — Encounter: Payer: Self-pay | Admitting: Cardiovascular Disease

## 2018-01-23 ENCOUNTER — Other Ambulatory Visit: Payer: Self-pay | Admitting: Family Medicine

## 2018-01-29 ENCOUNTER — Other Ambulatory Visit: Payer: Self-pay | Admitting: Family Medicine

## 2018-01-29 DIAGNOSIS — E876 Hypokalemia: Secondary | ICD-10-CM

## 2018-01-31 ENCOUNTER — Encounter: Payer: Self-pay | Admitting: Family Medicine

## 2018-02-03 ENCOUNTER — Other Ambulatory Visit: Payer: Self-pay | Admitting: Orthopedic Surgery

## 2018-02-03 DIAGNOSIS — M25552 Pain in left hip: Secondary | ICD-10-CM

## 2018-02-03 DIAGNOSIS — M48061 Spinal stenosis, lumbar region without neurogenic claudication: Secondary | ICD-10-CM

## 2018-02-03 DIAGNOSIS — M5416 Radiculopathy, lumbar region: Secondary | ICD-10-CM

## 2018-02-08 ENCOUNTER — Other Ambulatory Visit: Payer: Self-pay | Admitting: Family Medicine

## 2018-02-08 MED ORDER — PANTOPRAZOLE SODIUM 40 MG PO TBEC: 40.0000 mg | DELAYED_RELEASE_TABLET | Freq: Two times a day (BID) | ORAL | 3 refills | Status: AC

## 2018-02-08 MED ORDER — ONDANSETRON HCL 4 MG PO TABS
ORAL_TABLET | ORAL | 3 refills | Status: AC
Start: 2018-02-08 — End: ?

## 2018-02-12 ENCOUNTER — Other Ambulatory Visit: Payer: Self-pay | Admitting: Family Medicine

## 2018-02-12 NOTE — Telephone Encounter (Signed)
Ok to refill??  Last office visit 12/10/2017.  Last refill 01/14/2018.

## 2018-02-17 ENCOUNTER — Telehealth: Payer: Self-pay | Admitting: Cardiovascular Disease

## 2018-02-17 NOTE — Telephone Encounter (Signed)
Do they know what BP has been running?

## 2018-02-17 NOTE — Telephone Encounter (Signed)
Returned call. No answer, left message for pt's daughter to return call to find out what blood pressure has been running.

## 2018-02-17 NOTE — Telephone Encounter (Signed)
Patient's daughter states that patient is taking 2 fluid pills but is still swelling. Wants to know if she can take 3. / tg

## 2018-02-17 NOTE — Telephone Encounter (Signed)
Returned call to pt's daughter. She states that her mother's feet and legs are swollen. She has not gained any weight as she does weight daily. She states that she has been elevating her legs in the evening, but this does not help. She does not wear support stockings as they hurt her legs too bad to tolerate. She has not been eating any foods that are high in sodium. She is currently taking lasix 20 mg in the AM and 20 mg in the PM. She asks if she could increase it for a few days to help get the extra fluid off. She denies any chest pain, elevated hr,  dizziness or sob. Please advise.

## 2018-02-18 NOTE — Telephone Encounter (Signed)
If SBP is >105, she can increase the Lasix to 40mg  QAM/20mg  QPM x 3 days then go back to usual dosing. Limit fluid intake to 50-60oz per day from all sources. At our [place] she was reporting eating out for lunch daily and getting Meals on Wheels so I suspect she's getting a lot more sodium than they realize - really need to start actually counting the amount/intake to make sure it's 2000mg  per day.   At her visit we discussed wrapping her legs instead with ACE wraps since she hasn't been able to tolerate stockings. These would need to be placed daily in the AM before she wakes up in the morning starting from foot all the way up to behind the knee. Would also keep legs elevated at all times when seated, even above heart level when possible. Unfortunately we cannot be too aggressive with diuretics because of her history of low blood pressure.  Nachman Sundt PA-C

## 2018-02-18 NOTE — Telephone Encounter (Signed)
Called pt's daughter. Her sbp has bee running around 110. Informed pt's daughter on lasix increase for 3 days. Also reminded her of the conversation that took place during her office visit about ACE wraps. She stated she would discuss that again with her mother. She voiced understanding of plan. Will call next week with updated symptoms.

## 2018-03-05 ENCOUNTER — Telehealth: Payer: Self-pay | Admitting: Cardiovascular Disease

## 2018-03-05 NOTE — Telephone Encounter (Signed)
Agree with your recommendations 

## 2018-03-05 NOTE — Telephone Encounter (Signed)
I will forward to Dr Koneswaran 

## 2018-03-05 NOTE — Telephone Encounter (Signed)
Pt is having swelling in feet and legs, gained 2.5lbs over night Wednesday and today when she was weighed she's lost 3lbs, but is still continuing to swell

## 2018-03-05 NOTE — Telephone Encounter (Signed)
I spoke with daughter, they take her out to eat lunch every day to [employer], she knows everyone there.It is a social event for her, as everyone there knows her.She has beans and potatoes and 2 glasses of sweet tea. I told her beans are probably cooked with cured ham hock and to avoid.They are going to try other foods and ask for no salt to meal.She drinks around 6 glasses of water at home, elevates her feet when sitting.I encouraged daughter to get 4 inch ace wraps and explained that she start wrapping from ankles up, not knees down. She will call me next week with update

## 2018-03-05 NOTE — Telephone Encounter (Signed)
I reviewed [name] [name]'s note dated 02/18/2018.  It appears she was eating out for lunch daily getting excessive sodium.  Has she been monitoring her sodium intake?  Has she tried wrapping her legs and Ace wraps?  If she limiting her fluid intake?

## 2018-03-26 ENCOUNTER — Other Ambulatory Visit: Payer: Self-pay | Admitting: Family Medicine

## 2018-03-26 ENCOUNTER — Other Ambulatory Visit (HOSPITAL_COMMUNITY): Payer: Self-pay | Admitting: Oncology

## 2018-03-26 ENCOUNTER — Ambulatory Visit: Payer: Medicare Other | Admitting: Cardiovascular Disease

## 2018-03-26 DIAGNOSIS — E538 Deficiency of other specified B group vitamins: Secondary | ICD-10-CM

## 2018-03-26 NOTE — Telephone Encounter (Signed)
Requesting refill    Ultracet  LOV: 12/10/17  LRF: 02/12/18

## 2018-03-31 ENCOUNTER — Encounter: Payer: Self-pay | Admitting: Cardiovascular Disease

## 2018-03-31 ENCOUNTER — Ambulatory Visit (INDEPENDENT_AMBULATORY_CARE_PROVIDER_SITE_OTHER): Payer: Medicare Other | Admitting: Cardiovascular Disease

## 2018-03-31 VITALS — BP 108/50 | HR 60 | Ht 64.0 in | Wt 168.8 lb

## 2018-03-31 DIAGNOSIS — N183 Chronic kidney disease, stage 3 unspecified: Secondary | ICD-10-CM

## 2018-03-31 DIAGNOSIS — I712 Thoracic aortic aneurysm, without rupture: Secondary | ICD-10-CM

## 2018-03-31 DIAGNOSIS — I482 Chronic atrial fibrillation, unspecified: Secondary | ICD-10-CM

## 2018-03-31 DIAGNOSIS — I2510 Atherosclerotic heart disease of native coronary artery without angina pectoris: Secondary | ICD-10-CM

## 2018-03-31 DIAGNOSIS — I5023 Acute on chronic systolic (congestive) heart failure: Secondary | ICD-10-CM

## 2018-03-31 DIAGNOSIS — I7121 Aneurysm of the ascending aorta, without rupture: Secondary | ICD-10-CM

## 2018-03-31 DIAGNOSIS — I351 Nonrheumatic aortic (valve) insufficiency: Secondary | ICD-10-CM

## 2018-03-31 NOTE — Progress Notes (Signed)
SUBJECTIVE: The patient presents for routine follow-up.    She goes out to lunch every day and eats beans and potatoes and 2 glasses of sweet tea.  She drinks around 6 glasses of water at home.  She does try to elevate her feet when sitting.  She has a history of chronic systolic heart failure and chronic atrial fibrillation.  She has significant bilateral venous varicosities and has some chronic lower extremity swelling from that.  Coronary angiography on 03/18/17 demonstrated mild nonobstructive disease.  Echocardiogram 03/17/17 demonstrated moderately reduced left ventricular systolic function, LVEF 66-59%. There was mild to moderate aortic regurgitation and mild to moderate mitral regurgitation. There was moderate tricuspid regurgitation as well.  She has been evaluated by both B. Strader PA-C and Dayna Dunn PA-C in our office on 2 separate occasions since her last visit with me.  I reviewed their notes.  She is here with her daughter.  The patient is very hard of hearing.  She underwent vein stripping several years ago but she is uncertain if she had both legs done.  For the past month she has been sleeping in a lift chair because her legs have felt so heavy and began to cramp.  She also has some lower back pain, right hip pain, and a rash on her back.  She denies chest pain.  She has occasional shortness of breath.  She has been weighing herself and weights range from 166-169 pounds.    Review of Systems: As per "subjective", otherwise negative.  Allergies  Allergen Reactions  . Codeine Diarrhea and Nausea Only  . Tape Other (See Comments)    SKIN IS VERY THIN AND TEARS AND BRUISES EASILY; PLEASE USE COBAN WRAP!!  . Diona Fanti [Aspirin] Rash and Other (See Comments)    Petechia   . Iron Nausea And Vomiting    Oral iron causes nausea and vomiting  . Penicillins Rash    Has patient had a PCN reaction causing immediate rash, facial/tongue/throat swelling, SOB or lightheadedness  with hypotension: Yes Has patient had a PCN reaction causing severe rash involving mucus membranes or skin necrosis: Unknown Has patient had a PCN reaction that required hospitalization: No Has patient had a PCN reaction occurring within the last 10 years: No If all of the above answers are "NO", then may proceed with Cephalosporin use.     Current Outpatient Medications  Medication Sig Dispense Refill  . carvedilol (COREG) 3.125 MG tablet TAKE 1 TABLET BY MOUTH TWO  TIMES DAILY WITH MEALS 180 tablet 3  . clopidogrel (PLAVIX) 75 MG tablet Take 1 tablet (75 mg total) by mouth daily with breakfast. 90 tablet 3  . cyanocobalamin ([employer] VITAMIN B12) 2000 MCG tablet Take 1 tablet (2,000 mcg total) by mouth daily. 30 tablet 11  . folic acid (FOLVITE) 1 MG tablet TAKE 1 TABLET BY MOUTH EVERY DAY 30 tablet 11  . furosemide (LASIX) 20 MG tablet TAKE 1 TABLET BY MOUTH TWO  TIMES DAILY 180 tablet 1  . gabapentin (NEURONTIN) 100 MG capsule TAKE 2 CAPSULES BY MOUTH 3  TIMES DAILY 540 capsule 1  . hydrocortisone (ANUSOL-HC) 25 MG suppository Place 1 suppository (25 mg total) rectally every 12 (twelve) hours. (Patient taking differently: Place 25 mg rectally 2 (two) times daily. ) 12 suppository 1  . mometasone (ELOCON) 0.1 % cream Apply 1 application topically daily. 45 g 0  . ondansetron (ZOFRAN) 4 MG tablet TAKE 1 TABLET BY MOUTH  EVERY 8 HOURS  AS NEEDED FOR NAUSEA AND VOMITING 60 tablet 3  . pantoprazole (PROTONIX) 40 MG tablet Take 1 tablet (40 mg total) by mouth 2 (two) times daily. 180 tablet 3  . potassium chloride SA (K-DUR,KLOR-CON) 20 MEQ tablet TAKE 1 TABLET BY MOUTH  DAILY 90 tablet 3  . prednisoLONE acetate (PRED FORTE) 1 % ophthalmic suspension Place 1 drop into both eyes 4 (four) times daily.     . psyllium (METAMUCIL) 58.6 % packet Take 1 packet by mouth daily as needed (for constipation).     . sodium chloride (MURO 128) 2 % ophthalmic solution Place 1 drop into both eyes 3 (three) times  daily.     . traMADol-acetaminophen (ULTRACET) 37.5-325 MG tablet TAKE 1 TABLET BY MOUTH EVERY 4 HOURS AS NEEDED FOR PAIN 180 tablet 0   No current facility-administered medications for this visit.     Past Medical History:  Diagnosis Date  . Aortic insufficiency   . Ascending aortic aneurysm ([place])    a. 4.6cm by CT 2018 - pt deferred any further evaluation/invasive management therefore follow-up deferred.  . Bladder infection    h/o  . Chronic atrial fibrillation (H[facility])    a. not on anticoagulation due to prior GIB  . CKD (chronic kidney disease), stage III ([place])   . Coronary artery disease    a. mild nonobstructive by cath 2018.  Marland Kitchen DCIS (ductal carcinoma in situ) of breast 03/14/2013   right breast  . Dehydration    HISTORY   . Fall at home 09/10/12  . Hemorrhoids   . Hip fracture ([place])    hip surgery 2001  . History of blood clots    in leg  . History of knee surgery   . Hypertension   . Iron deficiency anemia, unspecified 03/14/2013   secondary to GI blood loss  . Kidney infection    h/o  . Melanoma of skin ([place]) 03/14/2013  . Melanoma of thigh ([place])    left  . Mini stroke ([place])   . Mitral regurgitation   . Nonischemic cardiomyopathy ([place])    a. EF 35-40% by echo in 03/2017 with cath showing nonobstructive CAD, suspected stress-induced.  . S/P colonoscopy March 2010   RMR: friable anal canal hemorrhoids, hyperplastic ascending polyp, adenomatous descending polyp   . Stomach ulcer    secondary to h.pylori, s/p treatment  . Thyroid condition   . Tricuspid regurgitation   . Venous stasis    edema    Past Surgical History:  Procedure Laterality Date  . APPENDECTOMY  1942  . BREAST LUMPECTOMY  1998  . CARDIAC CATHETERIZATION    . CATARACT EXTRACTION, BILATERAL    . CHOLECYSTECTOMY    . COLONOSCOPY  06/05/11   pancolonic diverticulosis/ileal reosion/abnormal anorectal junction s/p biopsy: path for small intestine and Ti was benign with non-villous atrophy, rectal  biopsy with prominent prolapse changes, no acute inflammation  . CORNEAL TRANSPLANT Bilateral 2013  . ENTEROSCOPY N/A 06/13/2013   TIR:WERX Gastritis/GI BLEED MOST LIKELY DUE TO DUODENAL ULCERS, AND ? AVMs  . ESOPHAGOGASTRODUODENOSCOPY  06/05/11   small hiatal hernia; + H.PYLORI GASTRITIS, s/p 5 days of Pylera, unable to finish due to N/V  . ESOPHAGOGASTRODUODENOSCOPY N/A 06/13/2013   Dr. Oneida Alar- see enteroscopy  . ESOPHAGOGASTRODUODENOSCOPY (EGD) WITH ESOPHAGEAL DILATION N/A 06/08/2013   VQM:GQQPY hiatal hernia;  otherwise normal EGD s/p dilation  . GIVENS CAPSULE STUDY N/A 06/08/2013   Procedure: GIVENS CAPSULE STUDY;  Surgeon: Daneil Dolin, MD;  Location[facility];  Service: Endoscopy;  Laterality: N/A;  . HEMORRHOID BANDING    . KNEE SURGERY Right 05/2006   total right  . LEFT HEART CATH AND CORONARY ANGIOGRAPHY N/A 03/18/2017   Procedure: LEFT HEART CATH AND CORONARY ANGIOGRAPHY;  Surgeon: Wellington Hampshire, MD;  Location: [facility];  Service: Cardiovascular;  Laterality: N/A;  . LEG SURGERY    . MELANOMA EXCISION Left 08/2006   left leg excision  . NM MYOCAR PERF WALL MOTION  03/27/2010   dipyridamole; small reversible basal to mid-septal defect (?artifact), post-stress EF 55%, low risk scan   . PARTIAL HYSTERECTOMY  1976  . TONSILLECTOMY    . TOTAL HIP ARTHROPLASTY Left 2001&2004    X 2 FOR LEFT HIP  . TRANSTHORACIC ECHOCARDIOGRAM  10/06/2012   EF 55-60%, mod eccentric hypertrophy, grade 2 diastolic dysfunction; mildly calcifed AV annulus with moderate regurg; aortic root mildly dilated; LA severely dailted; PA peak pressure 49mHg  . VARICOSE VEIN SURGERY      Social History   Socioeconomic History  . Marital status: Widowed    Spouse name: Not on file  . Number of children: 5  . Years of education: 6  . Highest education level: Not on file  Occupational History    Employer: RETIRED  Social Needs  . Financial resource strain: Not on file  . Food insecurity:      Worry: Not on file    Inability: Not on file  . Transportation needs:    Medical: Not on file    Non-medical: Not on file  Tobacco Use  . Smoking status: Former Smoker    Packs/day: 1.50    Years: 20.00    Pack years: 30.00    Types: Cigarettes    Last attempt to quit: 07/05/1975    Years since quitting: 42.7  . Smokeless tobacco: Never Used  . Tobacco comment: Quit smoking in 1975  Substance and Sexual Activity  . Alcohol use: No  . Drug use: No  . Sexual activity: Never  Lifestyle  . Physical activity:    Days per week: Not on file    Minutes per session: Not on file  . Stress: Not on file  Relationships  . Social connections:    Talks on phone: Not on file    Gets together: Not on file    Attends religious service: Not on file    Active member of club or organization: Not on file    Attends meetings of clubs or organizations: Not on file    Relationship status: Not on file  . Intimate partner violence:    Fear of current or ex partner: Not on file    Emotionally abused: Not on file    Physically abused: Not on file    Forced sexual activity: Not on file  Other Topics Concern  . Not on file  Social History Narrative  . Not on file     Vitals:   03/31/18 1030  BP: (!) 108/50  Pulse: 60  SpO2: 92%  Weight: 168 lb 12.8 oz (76.6 kg)  Height: 5\' 4"  (1.626 m)    Wt Readings from Last 3 Encounters:  03/31/18 168 lb 12.8 oz (76.6 kg)  12/23/17 160 lb (72.6 kg)  12/10/17 160 lb (72.6 kg)     PHYSICAL EXAM General: NAD HEENT: Normal. Neck: No JVD, no thyromegaly. Lungs: Clear to auscultation bilaterally with normal respiratory effort. CV: Regular rate andirregularrhythm, normal S1/S2, no O7,5/6 systolicmurmur along left  sternal border and 2/4 holodiastolic murmur along left sternal border.Bilateral venous varicosities with chronic lower extremityedema. No pitting edema. Abdomen: Soft, nontender, no distention.  Neurologic: Alert and oriented.   Psych: Normal affect. Skin: Normal. Musculoskeletal: No gross deformities.    ECG: Reviewed above under Subjective   Labs:    Lipids: Lab Results  Component Value Date/Time   LDLCALC 119 (H) 03/17/2017 01:50 AM   CHOL 189 03/17/2017 01:50 AM   TRIG 73 03/17/2017 01:50 AM   HDL 55 03/17/2017 01:50 AM       ASSESSMENT AND PLAN:  1.  Acute on chronic systolic heart failure/stress induced cardiomyopathy: Weights have gradually gone up with increased edema up to the thighs. She is currently on  carvedilol and Lasix 20 mg twice daily.  She has venous insufficiency and has undergone vein stripping and this is also contributing to leg edema.  The importance of a low-sodium diet was emphasized.  She did not tolerate compression stockings.  ACE wraps have been recommended.  Her weight is up 8 pounds since her last office visit.  Blood pressure is in the low normal range.  This has limited aggressive diuresis.  BUN 20 and creatinine 1.36 on 12/10/2017.  I will try Lasix 40 mg twice daily and 20 mg twice daily on alternate days.  I have asked her to double up on her potassium on days she takes the higher dose of Lasix.  I will check a basic metabolic panel and a CBC within the next several days.  2. Aortic root dilatation/thoracic aortic aneurysm with mild to moderate aortic regurgitation:Given her age she does not want any interventions performed if one is needed in the future. At this time, no further testing is warranted.  3. Chronic atrial fibrillation: Not an anticoagulation candidate due to prior GI bleeding. Takes Plavix for history of TIA and aspirin intolerance. Symptomatically stable on carvedilol. No changes to therapy.  4.  Chronic kidney disease stage III: BUN 20 and creatinine 1.36 on 12/10/2017. I will check a basic metabolic panel within the next several days after increasing her diuretic dosing.   Disposition: Follow up 4 months   Kate Sable, M.D., F.A.C.C.

## 2018-03-31 NOTE — Patient Instructions (Signed)
Medication Instructions:  LASIX- ALTERNATE 40 MG - TWO TIMES DAILY , WITH 20 MG - TWO TIMES DAILY  ON DAYS YOU TAKE LASIX 40 MG - TWO TIMES DAILY , ALSO TAKE AN EXTRA DOSE OF POTASSIUM   Labwork: 1 WEEK AFTER STARTING INCREASED DOSE OF LASIX   BMET CBC  Testing/Procedures: NONE  Follow-Up: Your physician recommends that you schedule a follow-up appointment in: 4 MONTHS    Any Other Special Instructions Will Be Listed Below (If Applicable).     If you need a refill on your cardiac medications before your next appointment, please call your pharmacy.

## 2018-04-06 ENCOUNTER — Other Ambulatory Visit: Payer: Self-pay | Admitting: Family Medicine

## 2018-04-06 DIAGNOSIS — E538 Deficiency of other specified B group vitamins: Secondary | ICD-10-CM

## 2018-04-06 MED ORDER — CYANOCOBALAMIN 2000 MCG PO TABS: 2000.0000 ug | ORAL_TABLET | Freq: Every day | ORAL | 11 refills | Status: DC

## 2018-04-07 ENCOUNTER — Other Ambulatory Visit (HOSPITAL_COMMUNITY)
Admission: RE | Admit: 2018-04-07 | Discharge: 2018-04-07 | Disposition: A | Discharge status: Home / Self Care | Payer: Medicare Other | Source: Ambulatory Visit | Attending: Cardiovascular Disease | Admitting: Cardiovascular Disease

## 2018-04-07 DIAGNOSIS — I482 Chronic atrial fibrillation: Secondary | ICD-10-CM | POA: Diagnosis not present

## 2018-04-07 DIAGNOSIS — N183 Chronic kidney disease, stage 3 (moderate): Secondary | ICD-10-CM | POA: Diagnosis not present

## 2018-04-07 LAB — CBC WITH DIFFERENTIAL/PLATELET
BASOS ABS: 0 10*3/uL (ref 0.0–0.1)
BASOS PCT: 1 %
Eosinophils Absolute: 0.2 10*3/uL (ref 0.0–0.7)
Eosinophils Relative: 3 %
HEMATOCRIT: 31.8 % — AB (ref 36.0–46.0)
Hemoglobin: 9.7 g/dL — ABNORMAL LOW (ref 12.0–15.0)
Lymphocytes Relative: 37 %
Lymphs Abs: 1.8 10*3/uL (ref 0.7–4.0)
MCH: 29.4 pg (ref 26.0–34.0)
MCHC: 30.5 g/dL (ref 30.0–36.0)
MCV: 96.4 fL (ref 78.0–100.0)
MONO ABS: 0.4 10*3/uL (ref 0.1–1.0)
Monocytes Relative: 8 %
NEUTROS PCT: 51 %
Neutro Abs: 2.5 10*3/uL (ref 1.7–7.7)
Platelets: 172 10*3/uL (ref 150–400)
RBC: 3.3 MIL/uL — ABNORMAL LOW (ref 3.87–5.11)
RDW: 14.1 % (ref 11.5–15.5)
WBC: 4.8 10*3/uL (ref 4.0–10.5)

## 2018-04-07 LAB — BASIC METABOLIC PANEL
ANION GAP: 10 (ref 5–15)
BUN: 32 mg/dL — ABNORMAL HIGH (ref 8–23)
CALCIUM: 8.2 mg/dL — AB (ref 8.9–10.3)
CO2: 29 mmol/L (ref 22–32)
Chloride: 102 mmol/L (ref 98–111)
Creatinine, Ser: 1.75 mg/dL — ABNORMAL HIGH (ref 0.44–1.00)
GFR calc non Af Amer: 24 mL/min — ABNORMAL LOW (ref >60)
GFR, EST AFRICAN AMERICAN: 28 mL/min — AB (ref >60)
Glucose, Bld: 116 mg/dL — ABNORMAL HIGH (ref 70–99)
Potassium: 4.2 mmol/L (ref 3.5–5.1)
Sodium: 141 mmol/L (ref 135–145)

## 2018-04-09 ENCOUNTER — Telehealth: Payer: Self-pay

## 2018-04-09 DIAGNOSIS — Z79899 Other long term (current) drug therapy: Secondary | ICD-10-CM

## 2018-04-09 MED ORDER — FUROSEMIDE 20 MG PO TABS: ORAL_TABLET | ORAL | 1 refills | Status: DC

## 2018-04-09 NOTE — Telephone Encounter (Signed)
-----   Message from [place] sent at 04/09/2018  1:15 PM EDT -----   ----- Message ----- From: Arnoldo Lenis, MD Sent: 04/09/2018  12:42 PM EDT To: Massie Maroon, CMA  Labs show some decrease in renal function since lasix change. How are her weights doing? I would change her lasix to 40mg  in AM and 20mg  in PM, continue potassium like she has been taking. Some drop in her Hgb, has she noticed any recent bleeding? Please repeat BMET/CBC in 2 weeks   J BranchMD

## 2018-04-09 NOTE — Telephone Encounter (Signed)
Daughter reports no change in weight, has not noted any blood in pt's stools and will make med changes with Lasix and repeat labs in 2 weeks

## 2018-04-12 ENCOUNTER — Other Ambulatory Visit: Payer: Self-pay | Admitting: Family Medicine

## 2018-04-12 DIAGNOSIS — D509 Iron deficiency anemia, unspecified: Secondary | ICD-10-CM

## 2018-04-13 ENCOUNTER — Other Ambulatory Visit: Payer: Medicare Other

## 2018-04-13 DIAGNOSIS — D509 Iron deficiency anemia, unspecified: Secondary | ICD-10-CM

## 2018-04-14 LAB — IRON,TIBC AND FERRITIN PANEL
%SAT: 9 % — AB (ref 16–45)
FERRITIN: 12 ng/mL — AB (ref 16–288)
Iron: 35 ug/dL — ABNORMAL LOW (ref 45–160)
TIBC: 392 mcg/dL (calc) (ref 250–450)

## 2018-04-15 DIAGNOSIS — D509 Iron deficiency anemia, unspecified: Secondary | ICD-10-CM

## 2018-04-23 ENCOUNTER — Other Ambulatory Visit: Payer: Self-pay

## 2018-04-23 ENCOUNTER — Telehealth: Payer: Self-pay | Admitting: Student

## 2018-04-23 ENCOUNTER — Ambulatory Visit: Payer: Medicare Other | Admitting: Family Medicine

## 2018-04-23 ENCOUNTER — Encounter (HOSPITAL_COMMUNITY): Payer: Self-pay | Admitting: *Deleted

## 2018-04-23 ENCOUNTER — Emergency Department (HOSPITAL_COMMUNITY): Payer: Medicare Other

## 2018-04-23 ENCOUNTER — Emergency Department (HOSPITAL_COMMUNITY)
Admission: EM | Admit: 2018-04-23 | Discharge: 2018-04-23 | Disposition: A | Discharge status: Home / Self Care | Payer: Medicare Other | Source: Home / Self Care | Attending: Emergency Medicine | Admitting: Emergency Medicine

## 2018-04-23 DIAGNOSIS — K5732 Diverticulitis of large intestine without perforation or abscess without bleeding: Secondary | ICD-10-CM | POA: Insufficient documentation

## 2018-04-23 DIAGNOSIS — I252 Old myocardial infarction: Secondary | ICD-10-CM | POA: Insufficient documentation

## 2018-04-23 DIAGNOSIS — D649 Anemia, unspecified: Secondary | ICD-10-CM | POA: Diagnosis not present

## 2018-04-23 DIAGNOSIS — J690 Pneumonitis due to inhalation of food and vomit: Secondary | ICD-10-CM | POA: Diagnosis not present

## 2018-04-23 DIAGNOSIS — R0600 Dyspnea, unspecified: Secondary | ICD-10-CM | POA: Diagnosis not present

## 2018-04-23 DIAGNOSIS — Z8673 Personal history of transient ischemic attack (TIA), and cerebral infarction without residual deficits: Secondary | ICD-10-CM

## 2018-04-23 DIAGNOSIS — Z85828 Personal history of other malignant neoplasm of skin: Secondary | ICD-10-CM | POA: Insufficient documentation

## 2018-04-23 DIAGNOSIS — Z96642 Presence of left artificial hip joint: Secondary | ICD-10-CM | POA: Insufficient documentation

## 2018-04-23 DIAGNOSIS — R600 Localized edema: Secondary | ICD-10-CM | POA: Diagnosis not present

## 2018-04-23 DIAGNOSIS — J9601 Acute respiratory failure with hypoxia: Secondary | ICD-10-CM | POA: Diagnosis not present

## 2018-04-23 DIAGNOSIS — Z23 Encounter for immunization: Secondary | ICD-10-CM | POA: Diagnosis not present

## 2018-04-23 DIAGNOSIS — R0602 Shortness of breath: Secondary | ICD-10-CM | POA: Diagnosis not present

## 2018-04-23 DIAGNOSIS — R3911 Hesitancy of micturition: Secondary | ICD-10-CM | POA: Diagnosis not present

## 2018-04-23 DIAGNOSIS — N183 Chronic kidney disease, stage 3 (moderate): Secondary | ICD-10-CM | POA: Insufficient documentation

## 2018-04-23 DIAGNOSIS — K5900 Constipation, unspecified: Secondary | ICD-10-CM | POA: Diagnosis not present

## 2018-04-23 DIAGNOSIS — Z87891 Personal history of nicotine dependence: Secondary | ICD-10-CM

## 2018-04-23 DIAGNOSIS — I2510 Atherosclerotic heart disease of native coronary artery without angina pectoris: Secondary | ICD-10-CM | POA: Insufficient documentation

## 2018-04-23 DIAGNOSIS — I130 Hypertensive heart and chronic kidney disease with heart failure and stage 1 through stage 4 chronic kidney disease, or unspecified chronic kidney disease: Secondary | ICD-10-CM | POA: Insufficient documentation

## 2018-04-23 DIAGNOSIS — I5023 Acute on chronic systolic (congestive) heart failure: Secondary | ICD-10-CM | POA: Diagnosis not present

## 2018-04-23 DIAGNOSIS — J441 Chronic obstructive pulmonary disease with (acute) exacerbation: Secondary | ICD-10-CM | POA: Diagnosis not present

## 2018-04-23 DIAGNOSIS — K56609 Unspecified intestinal obstruction, unspecified as to partial versus complete obstruction: Secondary | ICD-10-CM | POA: Diagnosis not present

## 2018-04-23 DIAGNOSIS — I509 Heart failure, unspecified: Secondary | ICD-10-CM

## 2018-04-23 DIAGNOSIS — R140 Abdominal distension (gaseous): Secondary | ICD-10-CM | POA: Diagnosis not present

## 2018-04-23 DIAGNOSIS — K529 Noninfective gastroenteritis and colitis, unspecified: Secondary | ICD-10-CM | POA: Diagnosis not present

## 2018-04-23 DIAGNOSIS — K5792 Diverticulitis of intestine, part unspecified, without perforation or abscess without bleeding: Secondary | ICD-10-CM | POA: Diagnosis not present

## 2018-04-23 LAB — COMPREHENSIVE METABOLIC PANEL
ALK PHOS: 61 U/L (ref 38–126)
ALT: 9 U/L (ref 0–44)
ANION GAP: 7 (ref 5–15)
AST: 14 U/L — ABNORMAL LOW (ref 15–41)
Albumin: 3.1 g/dL — ABNORMAL LOW (ref 3.5–5.0)
BILIRUBIN TOTAL: 0.9 mg/dL (ref 0.3–1.2)
BUN: 32 mg/dL — ABNORMAL HIGH (ref 8–23)
CALCIUM: 8.1 mg/dL — AB (ref 8.9–10.3)
CO2: 30 mmol/L (ref 22–32)
Chloride: 101 mmol/L (ref 98–111)
Creatinine, Ser: 1.87 mg/dL — ABNORMAL HIGH (ref 0.44–1.00)
GFR calc Af Amer: 26 mL/min — ABNORMAL LOW (ref >60)
GFR calc non Af Amer: 22 mL/min — ABNORMAL LOW (ref >60)
GLUCOSE: 97 mg/dL (ref 70–99)
Potassium: 3.8 mmol/L (ref 3.5–5.1)
Sodium: 138 mmol/L (ref 135–145)
TOTAL PROTEIN: 6.7 g/dL (ref 6.5–8.1)

## 2018-04-23 LAB — URINALYSIS, ROUTINE W REFLEX MICROSCOPIC
Bacteria, UA: NONE SEEN
Bilirubin Urine: NEGATIVE
GLUCOSE, UA: NEGATIVE mg/dL
KETONES UR: NEGATIVE mg/dL
Leukocytes, UA: NEGATIVE
NITRITE: NEGATIVE
PH: 5 (ref 5.0–8.0)
Protein, ur: NEGATIVE mg/dL
SPECIFIC GRAVITY, URINE: 1.015 (ref 1.005–1.030)

## 2018-04-23 LAB — CBC WITH DIFFERENTIAL/PLATELET
BASOS ABS: 0 10*3/uL (ref 0.0–0.1)
BASOS PCT: 0 %
Eosinophils Absolute: 0.2 10*3/uL (ref 0.0–0.7)
Eosinophils Relative: 2 %
HEMATOCRIT: 29.7 % — AB (ref 36.0–46.0)
Hemoglobin: 8.9 g/dL — ABNORMAL LOW (ref 12.0–15.0)
Lymphocytes Relative: 16 %
Lymphs Abs: 1.3 10*3/uL (ref 0.7–4.0)
MCH: 28.3 pg (ref 26.0–34.0)
MCHC: 30 g/dL (ref 30.0–36.0)
MCV: 94.6 fL (ref 78.0–100.0)
MONO ABS: 0.7 10*3/uL (ref 0.1–1.0)
Monocytes Relative: 9 %
NEUTROS ABS: 5.9 10*3/uL (ref 1.7–7.7)
Neutrophils Relative %: 73 %
Platelets: 172 10*3/uL (ref 150–400)
RBC: 3.14 MIL/uL — ABNORMAL LOW (ref 3.87–5.11)
RDW: 14.3 % (ref 11.5–15.5)
WBC: 8 10*3/uL (ref 4.0–10.5)

## 2018-04-23 LAB — LIPASE, BLOOD: LIPASE: 24 U/L (ref 11–51)

## 2018-04-23 MED ORDER — CIPROFLOXACIN HCL 500 MG PO TABS: 500.0000 mg | ORAL_TABLET | Freq: Two times a day (BID) | ORAL | 0 refills | Status: DC

## 2018-04-23 MED ORDER — CIPROFLOXACIN HCL 250 MG PO TABS
500.0000 mg | ORAL_TABLET | Freq: Once | ORAL | Status: AC
  Administered 2018-04-23: 500 mg via ORAL
  Filled 2018-04-23: qty 2

## 2018-04-23 MED ORDER — METRONIDAZOLE 500 MG PO TABS
500.0000 mg | ORAL_TABLET | Freq: Once | ORAL | Status: AC
  Administered 2018-04-23: 500 mg via ORAL
  Filled 2018-04-23: qty 1

## 2018-04-23 MED ORDER — METRONIDAZOLE 500 MG PO TABS: 500.0000 mg | ORAL_TABLET | Freq: Two times a day (BID) | ORAL | 0 refills | Status: DC

## 2018-04-23 MED ORDER — SODIUM CHLORIDE 0.9 % IV BOLUS
500.0000 mL | Freq: Once | INTRAVENOUS | Status: AC
  Administered 2018-04-23: 500 mL via INTRAVENOUS

## 2018-04-23 NOTE — ED Notes (Signed)
Patient unable to sign discharge, signature pad on computers not working. Patient and family understood discharge instructions. All questions and concerns addressed.

## 2018-04-23 NOTE — ED Triage Notes (Signed)
Pt c/o not being able to have a BM x 1 week. Daughter reports pt tried to use a suppository yesterday for her hemorrhoids but she was unable to insert it. Pt took Dulcolax yesterday with no BM. Pt takes stool softeners daily.

## 2018-04-23 NOTE — Discharge Instructions (Signed)
We believe your symptoms are caused by diverticulitis.  Most of the time this condition (please read through the included information) can be cured with outpatient antibiotics.  Please take the full course of prescribed medication(s) and follow up with the doctors recommended above.  Return to the ED if your abdominal pain worsens or fails to improve, you develop bloody vomiting, bloody diarrhea, you are unable to tolerate fluids due to vomiting, fever greater than 101, you fail to urinate at least once every 8-10 hours, or other symptoms that concern you.  Diverticulitis Diverticulitis is inflammation or infection of small pouches in your colon that form when you have a condition called diverticulosis. The pouches in your colon are called diverticula. Your colon, or large intestine, is where water is absorbed and stool is formed. Complications of diverticulitis can include: Bleeding. Severe infection. Severe pain. Perforation of your colon. Obstruction of your colon. CAUSES  Diverticulitis is caused by bacteria. Diverticulitis happens when stool becomes trapped in diverticula. This allows bacteria to grow in the diverticula, which can lead to inflammation and infection. RISK FACTORS People with diverticulosis are at risk for diverticulitis. Eating a diet that does not include enough fiber from fruits and vegetables may make diverticulitis more likely to develop. SYMPTOMS  Symptoms of diverticulitis may include: Abdominal pain and tenderness. The pain is normally located on the left side of the abdomen, but may occur in other areas. Fever and chills. Bloating. Cramping. Nausea. Vomiting. Constipation. Diarrhea. Blood in your stool. DIAGNOSIS  Your health care provider will ask you about your medical history and do a physical exam. You may need to have tests done because many medical conditions can cause the same symptoms as diverticulitis. Tests may include: Blood tests. Urine  tests. Imaging tests of the abdomen, including X-rays and CT scans. When your condition is under control, your health care provider may recommend that you have a colonoscopy. A colonoscopy can show how severe your diverticula are and whether something else is causing your symptoms. TREATMENT  Most cases of diverticulitis are mild and can be treated at home. Treatment may include: Taking over-the-counter pain medicines. Following a clear liquid diet. Taking antibiotic medicines by mouth for 7-10 days. More severe cases may be treated at a hospital. Treatment may include: Not eating or drinking. Taking prescription pain medicine. Receiving antibiotic medicines through an IV tube. Receiving fluids and nutrition through an IV tube. Surgery. HOME CARE INSTRUCTIONS  Follow your health care provider's instructions carefully. Follow a full liquid diet or other diet as directed by your health care provider. After your symptoms improve, your health care provider may tell you to change your diet. He or she may recommend you eat a high-fiber diet. Fruits and vegetables are good sources of fiber. Fiber makes it easier to pass stool. Take fiber supplements or probiotics as directed by your health care provider. Only take medicines as directed by your health care provider. Keep all your follow-up appointments. SEEK MEDICAL CARE IF:  Your pain does not improve. You have a hard time eating food. Your bowel movements do not return to normal. SEEK IMMEDIATE MEDICAL CARE IF:  Your pain becomes worse. Your symptoms do not get better. Your symptoms suddenly get worse. You have a fever. You have repeated vomiting. You have bloody or black, tarry stools. MAKE SURE YOU:  Understand these instructions. Will watch your condition. Will get help right away if you are not doing well or get worse. Document Released: 04/30/2005 Document Revised: 07/26/2013  Document Reviewed: 06/15/2013 [facility] Patient  Information 2015 [place], [place]. This information is not intended to replace advice given to you by your health care provider. Make sure you discuss any questions you have with your health care provider.

## 2018-04-23 NOTE — Telephone Encounter (Signed)
Patient was due to have lab work done. Patient's daughter stated that patient was in ED and wanted to know if we could check the blood work from there to see If it will work / tg

## 2018-04-23 NOTE — Telephone Encounter (Signed)
Pt's labs in ED were what we ordered cbc and bmet so she does not need to repeat them, daughter aware

## 2018-04-23 NOTE — ED Provider Notes (Signed)
Emergency Department Provider Note   I have reviewed the triage vital signs and the nursing notes.   HISTORY  Chief Complaint Constipation   HPI [name] [name] is a 84 y.o. female with PMH of Ascending Aortic Aneurysm, CKD, HTN, and non-ischemic cardiomyopathy presents to the ED with no bowel movement for the past week.  Patient has a history of constipation and hemorrhoids.  Family states she is been taking stool softener and attempted a rectal suppository yesterday with no relief in symptoms.  She denies any associated diarrhea, vomiting, nausea.  She is having some diffuse lower abdominal discomfort which is slightly worse on the right.  She does take tramadol for pain and family suspects that this may be causing her constipation.  No fevers or chills.  No UTI symptoms.  Past Medical History:  Diagnosis Date  . Aortic insufficiency   . Ascending aortic aneurysm ([place])    a. 4.6cm by CT 2018 - pt deferred any further evaluation/invasive management therefore follow-up deferred.  . Bladder infection    h/o  . Chronic atrial fibrillation (HCC)    a. not on anticoagulation due to prior GIB  . CKD (chronic kidney disease), stage III ([place])   . Coronary artery disease    a. mild nonobstructive by cath 2018.  Marland Kitchen DCIS (ductal carcinoma in situ) of breast 03/14/2013   right breast  . Dehydration    HISTORY   . Fall at home 09/10/12  . Hemorrhoids   . Hip fracture ([place])    hip surgery 2001  . History of blood clots    in leg  . History of knee surgery   . Hypertension   . Iron deficiency anemia, unspecified 03/14/2013   secondary to GI blood loss  . Kidney infection    h/o  . Melanoma of skin ([place]) 03/14/2013  . Melanoma of thigh ([place])    left  . Mini stroke ([place])   . Mitral regurgitation   . Nonischemic cardiomyopathy ([place])    a. EF 35-40% by echo in 03/2017 with cath showing nonobstructive CAD, suspected stress-induced.  . S/P colonoscopy March 2010   RMR: friable anal canal  hemorrhoids, hyperplastic ascending polyp, adenomatous descending polyp   . Stomach ulcer    secondary to h.pylori, s/p treatment  . Thyroid condition   . Tricuspid regurgitation   . Venous stasis    edema    Patient Active Problem List   Diagnosis Date Noted  . Heart failure ([place]) 04/04/2017  . Coronary artery disease 04/02/2017  . Hypertension 04/02/2017  . Stress-induced cardiomyopathy 03/19/2017  . Non-ST elevation (NSTEMI) myocardial infarction ([place])   . Chest pain 03/16/2017  . Atrial fibrillation, chronic ([place]) 03/16/2017  . 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 Vitamin B12 deficiency 01/26/2017  . Dysphagia 05/20/2016  . Hypokalemia 05/19/2016  . Thrombocytopenia ([place]) 05/19/2016  . Acute respiratory failure with hypoxia ([place]) 05/18/2016  . Acute bronchitis 05/18/2016  . Bilateral lower extremity edema 05/18/2016  . Degenerative joint disease involving multiple joints 05/18/2016  . Pyuria 05/18/2016  . DNR (do not resuscitate) 05/18/2016  . Constipation 02/09/2015  . Occult GI bleeding 08/23/2013  . IDA (iron deficiency anemia) 06/07/2013  . Melanoma of skin ([place]) 03/14/2013  . DCIS (ductal carcinoma in situ) of breast 03/14/2013  . BPPV (benign paroxysmal positional vertigo) 02/10/2013  . RBBB 02/10/2013  . Ascending aortic aneurysm ([place]) 02/10/2013  . Aortic insufficiency 02/10/2013  . S/P total knee replacement 08/14/2011  . Helicobacter pylori  gastritis 07/14/2011  . Anemia 02/04/2011  . Bladder infection   . BURSITIS, HIP 05/29/2009    Past Surgical History:  Procedure Laterality Date  . APPENDECTOMY  1942  . BREAST LUMPECTOMY  1998  . CARDIAC CATHETERIZATION    . CATARACT EXTRACTION, BILATERAL    . CHOLECYSTECTOMY    . COLONOSCOPY  06/05/11   pancolonic diverticulosis/ileal reosion/abnormal anorectal junction s/p biopsy: path for small intestine and Ti was benign with non-villous atrophy, rectal biopsy with prominent prolapse changes, no acute  inflammation  . CORNEAL TRANSPLANT Bilateral 2013  . ENTEROSCOPY N/A 06/13/2013   GUR:KYHC Gastritis/GI BLEED MOST LIKELY DUE TO DUODENAL ULCERS, AND ? AVMs  . ESOPHAGOGASTRODUODENOSCOPY  06/05/11   small hiatal hernia; + H.PYLORI GASTRITIS, s/p 5 days of Pylera, unable to finish due to N/V  . ESOPHAGOGASTRODUODENOSCOPY N/A 06/13/2013   Dr. Oneida Alar- see enteroscopy  . ESOPHAGOGASTRODUODENOSCOPY (EGD) WITH ESOPHAGEAL DILATION N/A 06/08/2013   WCB:JSEGB hiatal hernia;  otherwise normal EGD s/p dilation  . GIVENS CAPSULE STUDY N/A 06/08/2013   Procedure: GIVENS CAPSULE STUDY;  Surgeon: Daneil Dolin, MD;  Location[facility];  Service: Endoscopy;  Laterality: N/A;  . HEMORRHOID BANDING    . KNEE SURGERY Right 05/2006   total right  . LEFT HEART CATH AND CORONARY ANGIOGRAPHY N/A 03/18/2017   Procedure: LEFT HEART CATH AND CORONARY ANGIOGRAPHY;  Surgeon: Wellington Hampshire, MD;  Location: [facility];  Service: Cardiovascular;  Laterality: N/A;  . LEG SURGERY    . MELANOMA EXCISION Left 08/2006   left leg excision  . NM MYOCAR PERF WALL MOTION  03/27/2010   dipyridamole; small reversible basal to mid-septal defect (?artifact), post-stress EF 55%, low risk scan   . PARTIAL HYSTERECTOMY  1976  . TONSILLECTOMY    . TOTAL HIP ARTHROPLASTY Left 2001&2004    X 2 FOR LEFT HIP  . TRANSTHORACIC ECHOCARDIOGRAM  10/06/2012   EF 55-60%, mod eccentric hypertrophy, grade 2 diastolic dysfunction; mildly calcifed AV annulus with moderate regurg; aortic root mildly dilated; LA severely dailted; PA peak pressure 39mHg  . VARICOSE VEIN SURGERY     Allergies Codeine; Tape; Asa [aspirin]; Iron; and Penicillins  Family History  Problem Relation Age of Onset  . Breast cancer Daughter        also hyperlipidemia  . Breast cancer Daughter        also hyperlipidemia  . Asthma Mother   . Multiple sclerosis Child   . Hyperlipidemia Child   . Colon cancer Neg Hx     Social History Social History    Tobacco Use  . Smoking status: Former Smoker    Packs/day: 1.50    Years: 20.00    Pack years: 30.00    Types: Cigarettes    Last attempt to quit: 07/05/1975    Years since quitting: 42.8  . Smokeless tobacco: Never Used  . Tobacco comment: Quit smoking in 1975  Substance Use Topics  . Alcohol use: No  . Drug use: No    Review of Systems  Constitutional: No fever/chills Eyes: No visual changes. ENT: No sore throat. Cardiovascular: Denies chest pain. Respiratory: Denies shortness of breath. Gastrointestinal: Positive lower abdominal pain.  No nausea, no vomiting.  No diarrhea. Positive constipation. Genitourinary: Negative for dysuria. Musculoskeletal: Negative for back pain. Skin: Negative for rash. Neurological: Negative for headaches, focal weakness or numbness.  10-point ROS otherwise negative.  ____________________________________________   PHYSICAL EXAM:  VITAL SIGNS: Vitals:   04/23/18 1230 04/23/18 1300  BP: (!) 108/55 (!) 103/50  Pulse: 61 60     Constitutional: Alert and oriented. Well appearing and in no acute distress. Eyes: Conjunctivae are normal.  Head: Atraumatic. Nose: No congestion/rhinnorhea. Mouth/Throat: Mucous membranes are moist. Neck: No stridor.  Cardiovascular: Normal rate, regular rhythm. Good peripheral circulation. Grossly normal heart sounds.   Respiratory: Normal respiratory effort.  No retractions. Lungs CTAB. Gastrointestinal: Soft with focal right lower quadrant tenderness. No rebound or guarding. No distention. Rectal exam performed with nurse chaperone. Non-thrombosed external hemorrhoids without significant bleeding. No palpable stool in the rectum. No visible abscess or fissure.  Musculoskeletal: No lower extremity tenderness nor edema. No gross deformities of extremities. Neurologic:  Normal speech and language. No gross focal neurologic deficits are appreciated.  Skin:  Skin is warm, dry and intact. No rash  noted. ____________________________________________   LABS (all labs ordered are listed, but only abnormal results are displayed)  Labs Reviewed  COMPREHENSIVE METABOLIC PANEL - Abnormal; Notable for the following components:      Result Value   BUN 32 (*)    Creatinine, Ser 1.87 (*)    Calcium 8.1 (*)    Albumin 3.1 (*)    AST 14 (*)    GFR calc non Af Amer 22 (*)    GFR calc Af Amer 26 (*)    All other components within normal limits  CBC WITH DIFFERENTIAL/PLATELET - Abnormal; Notable for the following components:   RBC 3.14 (*)    Hemoglobin 8.9 (*)    HCT 29.7 (*)    All other components within normal limits  URINALYSIS, ROUTINE W REFLEX MICROSCOPIC - Abnormal; Notable for the following components:   Hgb urine dipstick SMALL (*)    All other components within normal limits  LIPASE, BLOOD   ____________________________________________  RADIOLOGY  Ct Abdomen Pelvis Wo Contrast  Result Date: 04/23/2018 CLINICAL DATA:  Abdominal distension EXAM: CT ABDOMEN AND PELVIS WITHOUT CONTRAST TECHNIQUE: Multidetector CT imaging of the abdomen and pelvis was performed following the standard protocol without IV contrast. COMPARISON:  None. FINDINGS: Lower chest: Mild scarring is noted in the bases bilaterally. Heart is enlarged. The cardiac blood pool is somewhat hypodense which may be related to underlying anemia. Hepatobiliary: No focal liver abnormality is seen. Status post cholecystectomy. No biliary dilatation. Pancreas: Unremarkable. No pancreatic ductal dilatation or surrounding inflammatory changes. Spleen: Normal in size without focal abnormality. Adrenals/Urinary Tract: Adrenal glands are within normal limits. Kidneys are well visualized bilaterally without renal calculi or obstructive changes. Left renal cyst is noted. The bladder is decompressed. Stomach/Bowel: There are inflammatory changes at the junction of the sigmoid and rectum with associated diverticular change and wall  thickening. This is consistent with acute diverticulitis. Scattered mild inflammatory change in the more proximal sigmoid colon is seen. The appendix has been surgically removed. No other obstructive or inflammatory changes are noted. No abscess or perforation is seen. Vascular/Lymphatic: Aortic atherosclerosis. No enlarged abdominal or pelvic lymph nodes. Reproductive: Uterus has been surgically removed. No adnexal mass is seen. Other: No abdominal wall hernia or abnormality. No abdominopelvic ascites. Musculoskeletal: Left hip replacement is noted. There is a chronic appearing right hip joint effusion. Degenerative changes of the lumbar spine are noted. No compression deformity is seen. IMPRESSION: Changes consistent with acute diverticulitis at the junction of the sigmoid and rectum with some more mild diverticular inflammatory change in the more proximal sigmoid colon. No abscess or perforation is noted at this time. Chronic right hip joint effusion. No other  acute abnormality is noted. Electronically Signed   By: Inez Catalina M.D.   On: 04/23/2018 12:42    ____________________________________________   PROCEDURES  Procedure(s) performed:   Procedures  None ____________________________________________   INITIAL IMPRESSION / ASSESSMENT AND PLAN / ED COURSE  Pertinent labs & imaging results that were available during my care of the patient were reviewed by me and considered in my medical decision making (see chart for details).  She presents to the emergency department with constipation for the past week.  She has no fecal impaction on exam.  She does have external hemorrhoids but no active bleeding.  Patient does have right-sided abdominal tenderness on my exam no peritoneal findings.  Patient has history of anemia so plan for labs and CT imaging of the abdomen and pelvis given her focal tenderness and age.   1:34 PM Patient's labs reviewed with no acute findings.  Normal UA.  CT scan  without contrast shows concern for diverticulitis.  Patient's hemoglobin is downtrending but she has been referred to a hematologist for iron infusions by her PCP.  She is not having any large-volume GI bleeding here.  She is afebrile and relatively pain-free.  Blood pressure is slightly low but in review of prior notes from August this appears to be her baseline blood pressure.  She does have some chronic kidney disease Augmentin for diverticulitis but patient has a listed penicillin allergy. Plan for Cipro/Flagyl for diverticulitis and f/u with PCP for repeat CBC next week. Also discussed that patient may benefit from GI evaluation but family states they would be hesitant to undergo colonoscopy given patient's age. Will discuss with PCP. \  1:34 PM Patient with 190 ml on bladder scan after urination. Discussed +/- foley catheter placement with patient and family. Patient is urinating here and post-void volume is relatively small. Given patient's age and relatively small volume retention I plan to defer foley cath placement for now and treat what I presume to be the underlying issue. Advised that if return symptoms worsen or if the patient stops urinating all together they may have to return for foley placement. Daughters at bedside verbalize understanding and are in agreement.  ____________________________________________  FINAL CLINICAL IMPRESSION(S) / ED DIAGNOSES  Final diagnoses:  Diverticulitis of large intestine without perforation or abscess, unspecified bleeding status  Constipation, unspecified constipation type  Urinary hesitancy  Anemia, unspecified type     MEDICATIONS GIVEN DURING THIS VISIT:  Medications  sodium chloride 0.9 % bolus 500 mL (0 mLs Intravenous Stopped 04/23/18 1252)  ciprofloxacin (CIPRO) tablet 500 mg (500 mg Oral Given 04/23/18 1325)  metroNIDAZOLE (FLAGYL) tablet 500 mg (500 mg Oral Given 04/23/18 1326)     NEW OUTPATIENT MEDICATIONS STARTED DURING THIS  VISIT:  New Prescriptions   CIPROFLOXACIN (CIPRO) 500 MG TABLET    Take 1 tablet (500 mg total) by mouth 2 (two) times daily for 7 days.   METRONIDAZOLE (FLAGYL) 500 MG TABLET    Take 1 tablet (500 mg total) by mouth 2 (two) times daily for 7 days.    Note:  This document was prepared using Dragon voice recognition software and may include unintentional dictation errors.  Nanda Quinton, MD Emergency Medicine    Aldeen Riga, [facility], MD 04/23/18 [phone]

## 2018-04-26 ENCOUNTER — Ambulatory Visit (INDEPENDENT_AMBULATORY_CARE_PROVIDER_SITE_OTHER): Payer: Medicare Other | Admitting: Family Medicine

## 2018-04-26 ENCOUNTER — Encounter: Payer: Self-pay | Admitting: Family Medicine

## 2018-04-26 ENCOUNTER — Inpatient Hospital Stay (HOSPITAL_COMMUNITY)
Admission: EM | Admit: 2018-04-26 | Discharge: 2018-05-01 | DRG: 291 | Disposition: A | Discharge status: Skilled Nursing Facility | Payer: Medicare Other | Attending: Internal Medicine | Admitting: Internal Medicine

## 2018-04-26 ENCOUNTER — Emergency Department (HOSPITAL_COMMUNITY): Payer: Medicare Other

## 2018-04-26 ENCOUNTER — Other Ambulatory Visit: Payer: Self-pay

## 2018-04-26 ENCOUNTER — Encounter (HOSPITAL_COMMUNITY): Payer: Self-pay | Admitting: Emergency Medicine

## 2018-04-26 VITALS — BP 110/50 | HR 76 | Temp 98.0°F | Resp 22 | Ht 65.0 in | Wt 170.0 lb

## 2018-04-26 DIAGNOSIS — I071 Rheumatic tricuspid insufficiency: Secondary | ICD-10-CM | POA: Diagnosis not present

## 2018-04-26 DIAGNOSIS — Z888 Allergy status to other drugs, medicaments and biological substances status: Secondary | ICD-10-CM | POA: Diagnosis not present

## 2018-04-26 DIAGNOSIS — R1312 Dysphagia, oropharyngeal phase: Secondary | ICD-10-CM | POA: Diagnosis not present

## 2018-04-26 DIAGNOSIS — Z96642 Presence of left artificial hip joint: Secondary | ICD-10-CM | POA: Diagnosis present

## 2018-04-26 DIAGNOSIS — I340 Nonrheumatic mitral (valve) insufficiency: Secondary | ICD-10-CM | POA: Diagnosis not present

## 2018-04-26 DIAGNOSIS — G894 Chronic pain syndrome: Secondary | ICD-10-CM | POA: Diagnosis not present

## 2018-04-26 DIAGNOSIS — K5732 Diverticulitis of large intestine without perforation or abscess without bleeding: Secondary | ICD-10-CM | POA: Diagnosis present

## 2018-04-26 DIAGNOSIS — H9190 Unspecified hearing loss, unspecified ear: Secondary | ICD-10-CM | POA: Diagnosis present

## 2018-04-26 DIAGNOSIS — Z23 Encounter for immunization: Secondary | ICD-10-CM | POA: Diagnosis present

## 2018-04-26 DIAGNOSIS — M1990 Unspecified osteoarthritis, unspecified site: Secondary | ICD-10-CM | POA: Diagnosis present

## 2018-04-26 DIAGNOSIS — Z79899 Other long term (current) drug therapy: Secondary | ICD-10-CM

## 2018-04-26 DIAGNOSIS — J9601 Acute respiratory failure with hypoxia: Secondary | ICD-10-CM | POA: Diagnosis present

## 2018-04-26 DIAGNOSIS — I482 Chronic atrial fibrillation, unspecified: Secondary | ICD-10-CM | POA: Diagnosis present

## 2018-04-26 DIAGNOSIS — Z66 Do not resuscitate: Secondary | ICD-10-CM | POA: Diagnosis present

## 2018-04-26 DIAGNOSIS — K5792 Diverticulitis of intestine, part unspecified, without perforation or abscess without bleeding: Secondary | ICD-10-CM

## 2018-04-26 DIAGNOSIS — J441 Chronic obstructive pulmonary disease with (acute) exacerbation: Secondary | ICD-10-CM | POA: Diagnosis present

## 2018-04-26 DIAGNOSIS — R1310 Dysphagia, unspecified: Secondary | ICD-10-CM

## 2018-04-26 DIAGNOSIS — I878 Other specified disorders of veins: Secondary | ICD-10-CM | POA: Diagnosis present

## 2018-04-26 DIAGNOSIS — R0602 Shortness of breath: Secondary | ICD-10-CM | POA: Diagnosis not present

## 2018-04-26 DIAGNOSIS — M159 Polyosteoarthritis, unspecified: Secondary | ICD-10-CM | POA: Diagnosis not present

## 2018-04-26 DIAGNOSIS — I130 Hypertensive heart and chronic kidney disease with heart failure and stage 1 through stage 4 chronic kidney disease, or unspecified chronic kidney disease: Principal | ICD-10-CM | POA: Diagnosis present

## 2018-04-26 DIAGNOSIS — Z86000 Personal history of in-situ neoplasm of breast: Secondary | ICD-10-CM

## 2018-04-26 DIAGNOSIS — Z9049 Acquired absence of other specified parts of digestive tract: Secondary | ICD-10-CM

## 2018-04-26 DIAGNOSIS — E538 Deficiency of other specified B group vitamins: Secondary | ICD-10-CM | POA: Diagnosis present

## 2018-04-26 DIAGNOSIS — N184 Chronic kidney disease, stage 4 (severe): Secondary | ICD-10-CM | POA: Diagnosis present

## 2018-04-26 DIAGNOSIS — R262 Difficulty in walking, not elsewhere classified: Secondary | ICD-10-CM | POA: Diagnosis not present

## 2018-04-26 DIAGNOSIS — K219 Gastro-esophageal reflux disease without esophagitis: Secondary | ICD-10-CM | POA: Diagnosis not present

## 2018-04-26 DIAGNOSIS — D631 Anemia in chronic kidney disease: Secondary | ICD-10-CM | POA: Diagnosis present

## 2018-04-26 DIAGNOSIS — R0603 Acute respiratory distress: Secondary | ICD-10-CM

## 2018-04-26 DIAGNOSIS — K5900 Constipation, unspecified: Secondary | ICD-10-CM | POA: Diagnosis present

## 2018-04-26 DIAGNOSIS — Z803 Family history of malignant neoplasm of breast: Secondary | ICD-10-CM

## 2018-04-26 DIAGNOSIS — Z947 Corneal transplant status: Secondary | ICD-10-CM

## 2018-04-26 DIAGNOSIS — M79605 Pain in left leg: Secondary | ICD-10-CM

## 2018-04-26 DIAGNOSIS — Z880 Allergy status to penicillin: Secondary | ICD-10-CM | POA: Diagnosis not present

## 2018-04-26 DIAGNOSIS — Z885 Allergy status to narcotic agent status: Secondary | ICD-10-CM | POA: Diagnosis not present

## 2018-04-26 DIAGNOSIS — R0600 Dyspnea, unspecified: Secondary | ICD-10-CM

## 2018-04-26 DIAGNOSIS — M79606 Pain in leg, unspecified: Secondary | ICD-10-CM

## 2018-04-26 DIAGNOSIS — Z9181 History of falling: Secondary | ICD-10-CM

## 2018-04-26 DIAGNOSIS — M25559 Pain in unspecified hip: Secondary | ICD-10-CM | POA: Diagnosis present

## 2018-04-26 DIAGNOSIS — H8110 Benign paroxysmal vertigo, unspecified ear: Secondary | ICD-10-CM | POA: Diagnosis not present

## 2018-04-26 DIAGNOSIS — I10 Essential (primary) hypertension: Secondary | ICD-10-CM | POA: Diagnosis not present

## 2018-04-26 DIAGNOSIS — H40059 Ocular hypertension, unspecified eye: Secondary | ICD-10-CM | POA: Diagnosis not present

## 2018-04-26 DIAGNOSIS — G8929 Other chronic pain: Secondary | ICD-10-CM | POA: Diagnosis not present

## 2018-04-26 DIAGNOSIS — Z91048 Other nonmedicinal substance allergy status: Secondary | ICD-10-CM

## 2018-04-26 DIAGNOSIS — I712 Thoracic aortic aneurysm, without rupture: Secondary | ICD-10-CM | POA: Diagnosis not present

## 2018-04-26 DIAGNOSIS — I509 Heart failure, unspecified: Secondary | ICD-10-CM

## 2018-04-26 DIAGNOSIS — Z8711 Personal history of peptic ulcer disease: Secondary | ICD-10-CM

## 2018-04-26 DIAGNOSIS — I872 Venous insufficiency (chronic) (peripheral): Secondary | ICD-10-CM | POA: Diagnosis not present

## 2018-04-26 DIAGNOSIS — M6281 Muscle weakness (generalized): Secondary | ICD-10-CM | POA: Diagnosis not present

## 2018-04-26 DIAGNOSIS — R3911 Hesitancy of micturition: Secondary | ICD-10-CM | POA: Diagnosis present

## 2018-04-26 DIAGNOSIS — I428 Other cardiomyopathies: Secondary | ICD-10-CM | POA: Diagnosis present

## 2018-04-26 DIAGNOSIS — I4510 Unspecified right bundle-branch block: Secondary | ICD-10-CM | POA: Diagnosis present

## 2018-04-26 DIAGNOSIS — D509 Iron deficiency anemia, unspecified: Secondary | ICD-10-CM

## 2018-04-26 DIAGNOSIS — Z820 Family history of epilepsy and other diseases of the nervous system: Secondary | ICD-10-CM

## 2018-04-26 DIAGNOSIS — K224 Dyskinesia of esophagus: Secondary | ICD-10-CM | POA: Diagnosis present

## 2018-04-26 DIAGNOSIS — I259 Chronic ischemic heart disease, unspecified: Secondary | ICD-10-CM | POA: Diagnosis not present

## 2018-04-26 DIAGNOSIS — Z9981 Dependence on supplemental oxygen: Secondary | ICD-10-CM | POA: Diagnosis not present

## 2018-04-26 DIAGNOSIS — D649 Anemia, unspecified: Secondary | ICD-10-CM | POA: Diagnosis not present

## 2018-04-26 DIAGNOSIS — C4370 Malignant melanoma of unspecified lower limb, including hip: Secondary | ICD-10-CM | POA: Diagnosis present

## 2018-04-26 DIAGNOSIS — Z9842 Cataract extraction status, left eye: Secondary | ICD-10-CM

## 2018-04-26 DIAGNOSIS — Z825 Family history of asthma and other chronic lower respiratory diseases: Secondary | ICD-10-CM

## 2018-04-26 DIAGNOSIS — Z86718 Personal history of other venous thrombosis and embolism: Secondary | ICD-10-CM

## 2018-04-26 DIAGNOSIS — R4702 Dysphasia: Secondary | ICD-10-CM | POA: Diagnosis present

## 2018-04-26 DIAGNOSIS — M79604 Pain in right leg: Secondary | ICD-10-CM

## 2018-04-26 DIAGNOSIS — I2510 Atherosclerotic heart disease of native coronary artery without angina pectoris: Secondary | ICD-10-CM

## 2018-04-26 DIAGNOSIS — E876 Hypokalemia: Secondary | ICD-10-CM | POA: Diagnosis present

## 2018-04-26 DIAGNOSIS — I5023 Acute on chronic systolic (congestive) heart failure: Secondary | ICD-10-CM | POA: Diagnosis not present

## 2018-04-26 DIAGNOSIS — R600 Localized edema: Secondary | ICD-10-CM

## 2018-04-26 DIAGNOSIS — J690 Pneumonitis due to inhalation of food and vomit: Secondary | ICD-10-CM

## 2018-04-26 DIAGNOSIS — Z9841 Cataract extraction status, right eye: Secondary | ICD-10-CM

## 2018-04-26 DIAGNOSIS — Z90711 Acquired absence of uterus with remaining cervical stump: Secondary | ICD-10-CM

## 2018-04-26 DIAGNOSIS — I4891 Unspecified atrial fibrillation: Secondary | ICD-10-CM | POA: Diagnosis not present

## 2018-04-26 DIAGNOSIS — F419 Anxiety disorder, unspecified: Secondary | ICD-10-CM | POA: Diagnosis present

## 2018-04-26 DIAGNOSIS — Z79891 Long term (current) use of opiate analgesic: Secondary | ICD-10-CM

## 2018-04-26 DIAGNOSIS — I083 Combined rheumatic disorders of mitral, aortic and tricuspid valves: Secondary | ICD-10-CM | POA: Diagnosis present

## 2018-04-26 DIAGNOSIS — R2689 Other abnormalities of gait and mobility: Secondary | ICD-10-CM | POA: Diagnosis not present

## 2018-04-26 DIAGNOSIS — Z8673 Personal history of transient ischemic attack (TIA), and cerebral infarction without residual deficits: Secondary | ICD-10-CM

## 2018-04-26 DIAGNOSIS — Z87891 Personal history of nicotine dependence: Secondary | ICD-10-CM

## 2018-04-26 DIAGNOSIS — Z7902 Long term (current) use of antithrombotics/antiplatelets: Secondary | ICD-10-CM

## 2018-04-26 HISTORY — DX: Heart failure, unspecified: I50.9

## 2018-04-26 LAB — COMPREHENSIVE METABOLIC PANEL
ALBUMIN: 3.1 g/dL — AB (ref 3.5–5.0)
ALT: 12 U/L (ref 0–44)
AST: 24 U/L (ref 15–41)
Alkaline Phosphatase: 64 U/L (ref 38–126)
Anion gap: 9 (ref 5–15)
BILIRUBIN TOTAL: 0.8 mg/dL (ref 0.3–1.2)
BUN: 32 mg/dL — AB (ref 8–23)
CO2: 27 mmol/L (ref 22–32)
Calcium: 8.2 mg/dL — ABNORMAL LOW (ref 8.9–10.3)
Chloride: 102 mmol/L (ref 98–111)
Creatinine, Ser: 1.97 mg/dL — ABNORMAL HIGH (ref 0.44–1.00)
GFR calc Af Amer: 24 mL/min — ABNORMAL LOW (ref >60)
GFR calc non Af Amer: 21 mL/min — ABNORMAL LOW (ref >60)
GLUCOSE: 103 mg/dL — AB (ref 70–99)
Potassium: 3.6 mmol/L (ref 3.5–5.1)
Sodium: 138 mmol/L (ref 135–145)
TOTAL PROTEIN: 6.7 g/dL (ref 6.5–8.1)

## 2018-04-26 LAB — TROPONIN I
Troponin I: <0.03 ng/mL (ref <0.03)
Troponin I: <0.03 ng/mL (ref <0.03)

## 2018-04-26 LAB — CBC WITH DIFFERENTIAL/PLATELET
BASOS ABS: 0 10*3/uL (ref 0.0–0.1)
Basophils Relative: 0 %
EOS PCT: 9 %
Eosinophils Absolute: 0.6 10*3/uL (ref 0.0–0.7)
HCT: 30.8 % — ABNORMAL LOW (ref 36.0–46.0)
Hemoglobin: 9.3 g/dL — ABNORMAL LOW (ref 12.0–15.0)
LYMPHS ABS: 0.4 10*3/uL — AB (ref 0.7–4.0)
Lymphocytes Relative: 6 %
MCH: 28.7 pg (ref 26.0–34.0)
MCHC: 30.2 g/dL (ref 30.0–36.0)
MCV: 95.1 fL (ref 78.0–100.0)
MONO ABS: 0.4 10*3/uL (ref 0.1–1.0)
Monocytes Relative: 5 %
Neutro Abs: 5.5 10*3/uL (ref 1.7–7.7)
Neutrophils Relative %: 80 %
PLATELETS: 217 10*3/uL (ref 150–400)
RBC: 3.24 MIL/uL — ABNORMAL LOW (ref 3.87–5.11)
RDW: 14.4 % (ref 11.5–15.5)
WBC: 6.9 10*3/uL (ref 4.0–10.5)

## 2018-04-26 LAB — TSH: TSH: 3.012 u[IU]/mL (ref 0.350–4.500)

## 2018-04-26 LAB — BRAIN NATRIURETIC PEPTIDE: B Natriuretic Peptide: 656 pg/mL — ABNORMAL HIGH (ref 0.0–100.0)

## 2018-04-26 MED ORDER — FUROSEMIDE 10 MG/ML IJ SOLN: 40.0000 mg | Freq: Once | INTRAMUSCULAR | Status: DC

## 2018-04-26 MED ORDER — SODIUM CHLORIDE 0.9% FLUSH: 3.0000 mL | INTRAVENOUS | Status: DC | PRN

## 2018-04-26 MED ORDER — PREDNISOLONE ACETATE 1 % OP SUSP
1.0000 [drp] | Freq: Four times a day (QID) | OPHTHALMIC | Status: DC
  Administered 2018-04-27 – 2018-05-01 (×15): 1 [drp] via OPHTHALMIC
  Filled 2018-04-26: qty 5
  Filled 2018-04-26: qty 1

## 2018-04-26 MED ORDER — CIPROFLOXACIN IN D5W 400 MG/200ML IV SOLN
400.0000 mg | Freq: Once | INTRAVENOUS | Status: AC
  Administered 2018-04-26: 400 mg via INTRAVENOUS
  Filled 2018-04-26: qty 200

## 2018-04-26 MED ORDER — PANTOPRAZOLE SODIUM 40 MG PO TBEC
40.0000 mg | DELAYED_RELEASE_TABLET | Freq: Two times a day (BID) | ORAL | Status: DC
  Administered 2018-04-26 – 2018-05-01 (×8): 40 mg via ORAL
  Filled 2018-04-26 (×8): qty 1

## 2018-04-26 MED ORDER — SODIUM CHLORIDE (HYPERTONIC) 2 % OP SOLN
1.0000 [drp] | Freq: Three times a day (TID) | OPHTHALMIC | Status: DC
  Administered 2018-04-27 – 2018-05-01 (×13): 1 [drp] via OPHTHALMIC
  Filled 2018-04-26: qty 15

## 2018-04-26 MED ORDER — SODIUM CHLORIDE 0.9 % IV SOLN: 250.0000 mL | INTRAVENOUS | Status: DC | PRN

## 2018-04-26 MED ORDER — INFLUENZA VAC SPLIT HIGH-DOSE 0.5 ML IM SUSY
0.5000 mL | PREFILLED_SYRINGE | INTRAMUSCULAR | Status: AC
  Administered 2018-04-27: 0.5 mL via INTRAMUSCULAR
  Filled 2018-04-26: qty 0.5

## 2018-04-26 MED ORDER — FUROSEMIDE 10 MG/ML IJ SOLN
20.0000 mg | Freq: Two times a day (BID) | INTRAMUSCULAR | Status: AC
  Administered 2018-04-26 – 2018-04-30 (×9): 20 mg via INTRAVENOUS
  Filled 2018-04-26 (×9): qty 2

## 2018-04-26 MED ORDER — TRAMADOL HCL 50 MG PO TABS
50.0000 mg | ORAL_TABLET | Freq: Once | ORAL | Status: AC
  Administered 2018-04-26: 50 mg via ORAL
  Filled 2018-04-26: qty 1

## 2018-04-26 MED ORDER — GABAPENTIN 100 MG PO CAPS
200.0000 mg | ORAL_CAPSULE | Freq: Three times a day (TID) | ORAL | Status: DC
  Administered 2018-04-26 – 2018-05-01 (×11): 200 mg via ORAL
  Filled 2018-04-26 (×12): qty 2

## 2018-04-26 MED ORDER — POTASSIUM CHLORIDE CRYS ER 10 MEQ PO TBCR
10.0000 meq | EXTENDED_RELEASE_TABLET | Freq: Every day | ORAL | Status: DC
  Administered 2018-04-27 – 2018-05-01 (×4): 10 meq via ORAL
  Filled 2018-04-26 (×4): qty 1

## 2018-04-26 MED ORDER — ONDANSETRON HCL 4 MG/2ML IJ SOLN: 4.0000 mg | Freq: Four times a day (QID) | INTRAMUSCULAR | Status: DC | PRN

## 2018-04-26 MED ORDER — HEPARIN SODIUM (PORCINE) 5000 UNIT/ML IJ SOLN
5000.0000 [IU] | Freq: Three times a day (TID) | INTRAMUSCULAR | Status: DC
  Administered 2018-04-26 – 2018-05-01 (×15): 5000 [IU] via SUBCUTANEOUS
  Filled 2018-04-26 (×15): qty 1

## 2018-04-26 MED ORDER — VITAMIN B-12 1000 MCG PO TABS
2000.0000 ug | ORAL_TABLET | Freq: Every day | ORAL | Status: DC
  Administered 2018-04-27 – 2018-05-01 (×4): 2000 ug via ORAL
  Filled 2018-04-26 (×4): qty 2

## 2018-04-26 MED ORDER — TRAMADOL HCL 50 MG PO TABS
50.0000 mg | ORAL_TABLET | Freq: Three times a day (TID) | ORAL | Status: DC | PRN
  Administered 2018-04-26 – 2018-05-01 (×8): 50 mg via ORAL
  Filled 2018-04-26 (×8): qty 1

## 2018-04-26 MED ORDER — TRAMADOL HCL 50 MG PO TABS: 50.0000 mg | ORAL_TABLET | Freq: Three times a day (TID) | ORAL | Status: DC | PRN

## 2018-04-26 MED ORDER — ACETAMINOPHEN 325 MG PO TABS
650.0000 mg | ORAL_TABLET | Freq: Four times a day (QID) | ORAL | Status: DC | PRN
  Administered 2018-04-26 – 2018-05-01 (×4): 650 mg via ORAL
  Filled 2018-04-26 (×4): qty 2

## 2018-04-26 MED ORDER — CIPROFLOXACIN HCL 250 MG PO TABS
250.0000 mg | ORAL_TABLET | Freq: Two times a day (BID) | ORAL | Status: DC
  Administered 2018-04-27: 250 mg via ORAL
  Filled 2018-04-26: qty 1

## 2018-04-26 MED ORDER — FUROSEMIDE 10 MG/ML IJ SOLN
20.0000 mg | Freq: Once | INTRAMUSCULAR | Status: AC
  Administered 2018-04-26: 20 mg via INTRAVENOUS
  Filled 2018-04-26: qty 2

## 2018-04-26 MED ORDER — DOCUSATE SODIUM 100 MG PO CAPS
100.0000 mg | ORAL_CAPSULE | Freq: Two times a day (BID) | ORAL | Status: DC
  Administered 2018-04-26 – 2018-05-01 (×7): 100 mg via ORAL
  Filled 2018-04-26 (×8): qty 1

## 2018-04-26 MED ORDER — METRONIDAZOLE 500 MG PO TABS
500.0000 mg | ORAL_TABLET | Freq: Three times a day (TID) | ORAL | Status: DC
  Administered 2018-04-26 – 2018-04-28 (×6): 500 mg via ORAL
  Filled 2018-04-26 (×7): qty 1

## 2018-04-26 MED ORDER — MORPHINE SULFATE (PF) 2 MG/ML IV SOLN
1.0000 mg | INTRAVENOUS | Status: DC | PRN
  Administered 2018-04-26 – 2018-05-01 (×22): 1 mg via INTRAVENOUS
  Filled 2018-04-26 (×23): qty 1

## 2018-04-26 MED ORDER — POLYETHYLENE GLYCOL 3350 17 G PO PACK: 17.0000 g | PACK | Freq: Every day | ORAL | Status: DC | PRN

## 2018-04-26 MED ORDER — FOLIC ACID 1 MG PO TABS
1.0000 mg | ORAL_TABLET | Freq: Every day | ORAL | Status: DC
  Administered 2018-04-27 – 2018-05-01 (×4): 1 mg via ORAL
  Filled 2018-04-26 (×4): qty 1

## 2018-04-26 MED ORDER — CARVEDILOL 3.125 MG PO TABS
3.1250 mg | ORAL_TABLET | Freq: Two times a day (BID) | ORAL | Status: DC
  Administered 2018-04-27 – 2018-05-01 (×6): 3.125 mg via ORAL
  Filled 2018-04-26 (×6): qty 1

## 2018-04-26 MED ORDER — CLOPIDOGREL BISULFATE 75 MG PO TABS
75.0000 mg | ORAL_TABLET | Freq: Every day | ORAL | Status: DC
  Administered 2018-04-27 – 2018-05-01 (×4): 75 mg via ORAL
  Filled 2018-04-26 (×4): qty 1

## 2018-04-26 MED ORDER — SODIUM CHLORIDE 0.9% FLUSH
3.0000 mL | Freq: Two times a day (BID) | INTRAVENOUS | Status: DC
  Administered 2018-04-27 – 2018-05-01 (×8): 3 mL via INTRAVENOUS

## 2018-04-26 NOTE — ED Provider Notes (Signed)
[facility] EMERGENCY DEPARTMENT Provider Note   CSN: [id number] Arrival date & time: 04/26/18  1014     History   Chief Complaint Chief Complaint  Patient presents with  . Shortness of Breath  . Leg Swelling    HPI [name] [name] is a 84 y.o. female.  Patient with chronic kidney disease, atrial fibrillation, high blood pressure presents sent over by primary doctor for worsening shortness of breath, leg edema and inability to tolerate antibiotics.  Patient was recently diagnosed with diverticulitis placed on oral antibiotics however decreased appetite and hallucinations caused her to not be able to continue since Saturday.  Patient saw primary doctor and was very short of breath more than normal.  Concern for mild heart failure.  Weight gain uncertain specific amount.  56 of history through daughters as patient is very hard of hearing.     Past Medical History:  Diagnosis Date  . Aortic insufficiency   . Ascending aortic aneurysm ([place])    a. 4.6cm by CT 2018 - pt deferred any further evaluation/invasive management therefore follow-up deferred.  . Bladder infection    h/o  . Chronic atrial fibrillation (HCC)    a. not on anticoagulation due to prior GIB  . CKD (chronic kidney disease), stage III ([place])   . Coronary artery disease    a. mild nonobstructive by cath 2018.  Marland Kitchen DCIS (ductal carcinoma in situ) of breast 03/14/2013   right breast  . Dehydration    HISTORY   . Fall at home 09/10/12  . Hemorrhoids   . Hip fracture ([place])    hip surgery 2001  . History of blood clots    in leg  . History of knee surgery   . Hypertension   . Iron deficiency anemia, unspecified 03/14/2013   secondary to GI blood loss  . Kidney infection    h/o  . Melanoma of skin ([place]) 03/14/2013  . Melanoma of thigh ([place])    left  . Mini stroke ([place])   . Mitral regurgitation   . Nonischemic cardiomyopathy ([place])    a. EF 35-40% by echo in 03/2017 with cath showing nonobstructive CAD,  suspected stress-induced.  . S/P colonoscopy March 2010   RMR: friable anal canal hemorrhoids, hyperplastic ascending polyp, adenomatous descending polyp   . Stomach ulcer    secondary to h.pylori, s/p treatment  . Thyroid condition   . Tricuspid regurgitation   . Venous stasis    edema    Patient Active Problem List   Diagnosis Date Noted  . Heart failure ([place]) 04/04/2017  . Coronary artery disease 04/02/2017  . Hypertension 04/02/2017  . Stress-induced cardiomyopathy 03/19/2017  . Non-ST elevation (NSTEMI) myocardial infarction ([place])   . Chest pain 03/16/2017  . Atrial fibrillation, chronic ([place]) 03/16/2017  . Elevated homocysteine ([place]) 01/26/2017  . Vitamin B12 deficiency 01/26/2017  . Dysphagia 05/20/2016  . Hypokalemia 05/19/2016  . Thrombocytopenia ([place]) 05/19/2016  . Acute respiratory failure with hypoxia ([place]) 05/18/2016  . Acute bronchitis 05/18/2016  . Bilateral lower extremity edema 05/18/2016  . Degenerative joint disease involving multiple joints 05/18/2016  . Pyuria 05/18/2016  . DNR (do not resuscitate) 05/18/2016  . Constipation 02/09/2015  . Occult GI bleeding 08/23/2013  . IDA (iron deficiency anemia) 06/07/2013  . Melanoma of skin ([place]) 03/14/2013  . DCIS (ductal carcinoma in situ) of breast 03/14/2013  . BPPV (benign paroxysmal positional vertigo) 02/10/2013  . RBBB 02/10/2013  . Ascending aortic aneurysm ([place]) 02/10/2013  . Aortic insufficiency  02/10/2013  . S/P total knee replacement 08/14/2011  . Helicobacter pylori gastritis 07/14/2011  . Anemia 02/04/2011  . Bladder infection   . BURSITIS, HIP 05/29/2009    Past Surgical History:  Procedure Laterality Date  . APPENDECTOMY  1942  . BREAST LUMPECTOMY  1998  . CARDIAC CATHETERIZATION    . CATARACT EXTRACTION, BILATERAL    . CHOLECYSTECTOMY    . COLONOSCOPY  06/05/11   pancolonic diverticulosis/ileal reosion/abnormal anorectal junction s/p biopsy: path for small intestine and Ti was benign  with non-villous atrophy, rectal biopsy with prominent prolapse changes, no acute inflammation  . CORNEAL TRANSPLANT Bilateral 2013  . ENTEROSCOPY N/A 06/13/2013   KVQ:QVZD Gastritis/GI BLEED MOST LIKELY DUE TO DUODENAL ULCERS, AND ? AVMs  . ESOPHAGOGASTRODUODENOSCOPY  06/05/11   small hiatal hernia; + H.PYLORI GASTRITIS, s/p 5 days of Pylera, unable to finish due to N/V  . ESOPHAGOGASTRODUODENOSCOPY N/A 06/13/2013   Dr. Oneida Alar- see enteroscopy  . ESOPHAGOGASTRODUODENOSCOPY (EGD) WITH ESOPHAGEAL DILATION N/A 06/08/2013   GLO:VFIEP hiatal hernia;  otherwise normal EGD s/p dilation  . GIVENS CAPSULE STUDY N/A 06/08/2013   Procedure: GIVENS CAPSULE STUDY;  Surgeon: Daneil Dolin, MD;  Location: [facility]ITE;  Service: Endoscopy;  Laterality: N/A;  . HEMORRHOID BANDING    . KNEE SURGERY Right 05/2006   total right  . LEFT HEART CATH AND CORONARY ANGIOGRAPHY N/A 03/18/2017   Procedure: LEFT HEART CATH AND CORONARY ANGIOGRAPHY;  Surgeon: Wellington Hampshire, MD;  Location: [facility];  Service: Cardiovascular;  Laterality: N/A;  . LEG SURGERY    . MELANOMA EXCISION Left 08/2006   left leg excision  . NM MYOCAR PERF WALL MOTION  03/27/2010   dipyridamole; small reversible basal to mid-septal defect (?artifact), post-stress EF 55%, low risk scan   . PARTIAL HYSTERECTOMY  1976  . TONSILLECTOMY    . TOTAL HIP ARTHROPLASTY Left 2001&2004    X 2 FOR LEFT HIP  . TRANSTHORACIC ECHOCARDIOGRAM  10/06/2012   EF 55-60%, mod eccentric hypertrophy, grade 2 diastolic dysfunction; mildly calcifed AV annulus with moderate regurg; aortic root mildly dilated; LA severely dailted; PA peak pressure 56mHg  . VARICOSE VEIN SURGERY       OB History   None      Home Medications    Prior to Admission medications   Medication Sig Start Date End Date Taking? Authorizing Provider  carvedilol (COREG) 3.125 MG tablet TAKE 1 TABLET BY MOUTH TWO  TIMES DAILY WITH MEALS 01/25/18   [name], MD    ciprofloxacin (CIPRO) 500 MG tablet Take 1 tablet (500 mg total) by mouth 2 (two) times daily for 7 days. Patient not taking: Reported on 04/26/2018 04/23/18 04/30/18  Margette Fast, MD  clopidogrel (PLAVIX) 75 MG tablet Take 1 tablet (75 mg total) by mouth daily with breakfast. 11/05/17   [name], MD  cyanocobalamin ([employer] VITAMIN B12) 2000 MCG tablet Take 1 tablet (2,000 mcg total) by mouth daily. 04/06/18   [name], MD  folic acid (FOLVITE) 1 MG tablet TAKE 1 TABLET BY MOUTH EVERY DAY 11/17/17   Holley Bouche, NP  furosemide (LASIX) 20 MG tablet 04/09/18 dose decreased to 40 mg am and 20 mg pm 04/09/18   Arnoldo Lenis, MD  gabapentin (NEURONTIN) 100 MG capsule TAKE 2 CAPSULES BY MOUTH 3  TIMES DAILY 02/03/18   Carole Civil, MD  hydrocortisone (ANUSOL-HC) 25 MG suppository Place 1 suppository (25 mg total) rectally every 12 (  twelve) hours. Patient taking differently: Place 25 mg rectally 2 (two) times daily.  05/16/15   Annitta Needs, NP  metroNIDAZOLE (FLAGYL) 500 MG tablet Take 1 tablet (500 mg total) by mouth 2 (two) times daily for 7 days. Patient not taking: Reported on 04/26/2018 04/23/18 04/30/18  Margette Fast, MD  mometasone (ELOCON) 0.1 % cream Apply 1 application topically daily. 08/11/17   [name], MD  ondansetron (ZOFRAN) 4 MG tablet TAKE 1 TABLET BY MOUTH  EVERY 8 HOURS AS NEEDED FOR NAUSEA AND VOMITING 02/08/18   [name], MD  pantoprazole (PROTONIX) 40 MG tablet Take 1 tablet (40 mg total) by mouth 2 (two) times daily. 02/08/18   [name], MD  potassium chloride SA (K-DUR,KLOR-CON) 20 MEQ tablet TAKE 1 TABLET BY MOUTH  DAILY 01/29/18   [name], MD  prednisoLONE acetate (PRED FORTE) 1 % ophthalmic suspension Place 1 drop into both eyes 4 (four) times daily.     [provider]  psyllium (METAMUCIL) 58.6 % packet Take 1 packet by mouth daily as needed (for constipation).     [provider]  sodium chloride (MURO  128) 2 % ophthalmic solution Place 1 drop into both eyes 3 (three) times daily.     [provider]  traMADol-acetaminophen (ULTRACET) 37.5-325 MG tablet TAKE 1 TABLET BY MOUTH EVERY 4 HOURS AS NEEDED FOR PAIN 03/26/18   [name], MD    Family History Family History  Problem Relation Age of Onset  . Breast cancer Daughter        also hyperlipidemia  . Breast cancer Daughter        also hyperlipidemia  . Asthma Mother   . Multiple sclerosis Child   . Hyperlipidemia Child   . Colon cancer Neg Hx     Social History Social History   Tobacco Use  . Smoking status: Former Smoker    Packs/day: 1.50    Years: 20.00    Pack years: 30.00    Types: Cigarettes    Last attempt to quit: 07/05/1975    Years since quitting: 42.8  . Smokeless tobacco: Never Used  . Tobacco comment: Quit smoking in 1975  Substance Use Topics  . Alcohol use: No  . Drug use: No     Allergies   Codeine; Tape; Asa [aspirin]; Iron; and Penicillins   Review of Systems Review of Systems  Unable to perform ROS: Other  Respiratory: Positive for shortness of breath.      Physical Exam Updated Vital Signs BP (!) 114/55   Pulse 60   Temp 98.3 F (36.8 C) (Oral)   Resp (!) 21   Ht 5\' 5"  (1.651 m)   Wt 77.1 kg   SpO2 94%   BMI 28.29 kg/m   Physical Exam  Constitutional: She is oriented to person, place, and time. She appears well-developed and well-nourished.  HENT:  Head: Normocephalic and atraumatic.  Eyes: Conjunctivae are normal. Right eye exhibits no discharge. Left eye exhibits no discharge.  Neck: Normal range of motion. Neck supple. No tracheal deviation present.  Cardiovascular: Normal rate.  Pulmonary/Chest: Effort normal. She has rales in the right lower field and the left lower field.  Abdominal: Soft. She exhibits no distension. There is no tenderness. There is no guarding.  Musculoskeletal:       Right lower leg: She exhibits edema.       Left lower leg: She  exhibits edema.  Neurological: She is  alert and oriented to person, place, and time.  Skin: Skin is warm. Rash noted.  Psychiatric: She has a normal mood and affect.  Nursing note and vitals reviewed.    ED Treatments / Results  Labs (all labs ordered are listed, but only abnormal results are displayed) Labs Reviewed  COMPREHENSIVE METABOLIC PANEL - Abnormal; Notable for the following components:      Result Value   Glucose, Bld 103 (*)    BUN 32 (*)    Creatinine, Ser 1.97 (*)    Calcium 8.2 (*)    Albumin 3.1 (*)    GFR calc non Af Amer 21 (*)    GFR calc Af Amer 24 (*)    All other components within normal limits  CBC WITH DIFFERENTIAL/PLATELET - Abnormal; Notable for the following components:   RBC 3.24 (*)    Hemoglobin 9.3 (*)    HCT 30.8 (*)    Lymphs Abs 0.4 (*)    All other components within normal limits  BRAIN NATRIURETIC PEPTIDE - Abnormal; Notable for the following components:   B Natriuretic Peptide 656.0 (*)    All other components within normal limits  TROPONIN I    EKG None  Radiology Dg Chest 2 View  Result Date: 04/26/2018 CLINICAL DATA:  Shortness of breath EXAM: CHEST - 2 VIEW COMPARISON:  None FINDINGS: The lungs are hyperinflated likely secondary to COPD. There is hazy left perihilar airspace disease which may reflect atelectasis versus pneumonia. There is no pleural effusion or pneumothorax. There is stable cardiomegaly. The osseous structures are unremarkable. IMPRESSION: Hazy left perihilar airspace disease which may reflect atelectasis versus pneumonia. Electronically Signed   By: Kathreen Devoid   On: 04/26/2018 11:47    Procedures Procedures (including critical care time)  Medications Ordered in ED Medications  ciprofloxacin (CIPRO) IVPB 400 mg (has no administration in time range)  furosemide (LASIX) injection 20 mg (has no administration in time range)     Initial Impression / Assessment and Plan / ED Course  I have reviewed the triage  vital signs and the nursing notes.  Pertinent labs & imaging results that were available during my care of the patient were reviewed by me and considered in my medical decision making (see chart for details).    Patient presents with worsening shortness of breath of the past few days, few crackles at the bases and significant leg edema.  Concern for acute on chronic heart failure.  Patient is also not able to tolerate her oral antibiotics, no abdominal pain at this time or fever plan for IV antibiotics for diverticulitis.  IV Lasix ordered.  Plan for observation/admission to the hospitalist. Blood work reviewed chronic anemia hemoglobin around 9, troponin negative, mild acute renal failure, BNP elevated as expected.  The patients results and plan were reviewed and discussed.   Any x-rays performed were independently reviewed by myself.   Differential diagnosis were considered with the presenting HPI.  Medications  ciprofloxacin (CIPRO) IVPB 400 mg (has no administration in time range)  furosemide (LASIX) injection 20 mg (has no administration in time range)    Vitals:   04/26/18 1025 04/26/18 1026 04/26/18 1100 04/26/18 1200  BP: 91/76  (!) 103/47 (!) 114/55  Pulse: 64  62 60  Resp: 18  15 (!) 21  Temp: 98.3 F (36.8 C)     TempSrc: Oral     SpO2: 97%  94% 94%  Weight:  77.1 kg    Height:  5\' 5"  (  1.651 m)      Final diagnoses:  Acute dyspnea  Bilateral leg edema  Acute diverticulitis    Admission/ observation were discussed with the admitting physician, patient and/or family and they are comfortable with the plan.    Final Clinical Impressions(s) / ED Diagnoses   Final diagnoses:  Acute dyspnea  Bilateral leg edema  Acute diverticulitis    ED Discharge Orders    None       Elnora Morrison, MD 05/02/18 2246

## 2018-04-26 NOTE — Progress Notes (Signed)
Subjective:    Patient ID: [name] [name], female    DOB: [birth year], 84 y.o.   MRN: [id number]  HPI  Patient was seen in the emergency room on September 20 due to "constipation".  A CT scan obtained at that time however showed diverticulitis.  She was started on Cipro and Flagyl and is here today for follow-up.  Lower chest: Mild scarring is noted in the bases bilaterally. Heart is enlarged. The cardiac blood pool is somewhat hypodense which may be related to underlying anemia.  Hepatobiliary: No focal liver abnormality is seen. Status post cholecystectomy. No biliary dilatation.  Pancreas: Unremarkable. No pancreatic ductal dilatation or surrounding inflammatory changes.  Spleen: Normal in size without focal abnormality.  Adrenals/Urinary Tract: Adrenal glands are within normal limits. Kidneys are well visualized bilaterally without renal calculi or obstructive changes. Left renal cyst is noted. The bladder is decompressed.  Stomach/Bowel: There are inflammatory changes at the junction of the sigmoid and rectum with associated diverticular change and wall thickening. This is consistent with acute diverticulitis. Scattered mild inflammatory change in the more proximal sigmoid colon is seen. The appendix has been surgically removed. No other obstructive or inflammatory changes are noted. No abscess or perforation is seen.  Vascular/Lymphatic: Aortic atherosclerosis. No enlarged abdominal or pelvic lymph nodes.  Reproductive: Uterus has been surgically removed. No adnexal mass is seen.  Other: No abdominal wall hernia or abnormality. No abdominopelvic ascites.  Musculoskeletal: Left hip replacement is noted. There is a chronic appearing right hip joint effusion. Degenerative changes of the lumbar spine are noted. No compression deformity is seen.  Admission on 04/23/2018, Discharged on 04/23/2018  Component Date Value Ref Range Status  . Sodium 04/23/2018 138   135 - 145 mmol/L Final  . Potassium 04/23/2018 3.8  3.5 - 5.1 mmol/L Final  . Chloride 04/23/2018 101  98 - 111 mmol/L Final  . CO2 04/23/2018 30  22 - 32 mmol/L Final  . Glucose, Bld 04/23/2018 97  70 - 99 mg/dL Final  . BUN 04/23/2018 32* 8 - 23 mg/dL Final  . Creatinine, Ser 04/23/2018 1.87* 0.44 - 1.00 mg/dL Final  . Calcium 04/23/2018 8.1* 8.9 - 10.3 mg/dL Final  . Total Protein 04/23/2018 6.7  6.5 - 8.1 g/dL Final  . Albumin 04/23/2018 3.1* 3.5 - 5.0 g/dL Final  . AST 04/23/2018 14* 15 - 41 U/L Final  . ALT 04/23/2018 9  0 - 44 U/L Final  . Alkaline Phosphatase 04/23/2018 61  38 - 126 U/L Final  . Total Bilirubin 04/23/2018 0.9  0.3 - 1.2 mg/dL Final  . GFR calc non Af Amer 04/23/2018 22* >60 mL/min Final  . GFR calc Af Amer 04/23/2018 26* >60 mL/min Final   Comment: (NOTE) The eGFR has been calculated using the CKD EPI equation. This calculation has not been validated in all clinical situations. eGFR's persistently <60 mL/min signify possible Chronic Kidney Disease.   Georgiann Hahn gap 04/23/2018 7  5 - 15 Final   Performed at [facility], [address], [place], [place] [address]  . WBC 04/23/2018 8.0  4.0 - 10.5 K/uL Final  . RBC 04/23/2018 3.14* 3.87 - 5.11 MIL/uL Final  . Hemoglobin 04/23/2018 8.9* 12.0 - 15.0 g/dL Final  . HCT 04/23/2018 29.7* 36.0 - 46.0 % Final  . MCV 04/23/2018 94.6  78.0 - 100.0 fL Final  . MCH 04/23/2018 28.3  26.0 - 34.0 pg Final  . MCHC 04/23/2018 30.0  30.0 - 36.0 g/dL Final  .  RDW 04/23/2018 14.3  11.5 - 15.5 % Final  . Platelets 04/23/2018 172  150 - 400 K/uL Final  . Neutrophils Relative % 04/23/2018 73  % Final  . Neutro Abs 04/23/2018 5.9  1.7 - 7.7 K/uL Final  . Lymphocytes Relative 04/23/2018 16  % Final  . Lymphs Abs 04/23/2018 1.3  0.7 - 4.0 K/uL Final  . Monocytes Relative 04/23/2018 9  % Final  . Monocytes Absolute 04/23/2018 0.7  0.1 - 1.0 K/uL Final  . Eosinophils Relative 04/23/2018 2  % Final  . Eosinophils Absolute 04/23/2018  0.2  0.0 - 0.7 K/uL Final  . Basophils Relative 04/23/2018 0  % Final  . Basophils Absolute 04/23/2018 0.0  0.0 - 0.1 K/uL Final   Performed at [facility], [address] [place], [place] [address]  . Lipase 04/23/2018 24  11 - 51 U/L Final   Performed at [facility], [address] [place], [place] [address]  . Color, Urine 04/23/2018 YELLOW  YELLOW Final  . APPearance 04/23/2018 CLEAR  CLEAR Final  . Specific Gravity, Urine 04/23/2018 1.015  1.005 - 1.030 Final  . pH 04/23/2018 5.0  5.0 - 8.0 Final  . Glucose, UA 04/23/2018 NEGATIVE  NEGATIVE mg/dL Final  . Hgb urine dipstick 04/23/2018 SMALL* NEGATIVE Final  . Bilirubin Urine 04/23/2018 NEGATIVE  NEGATIVE Final  . Ketones, ur 04/23/2018 NEGATIVE  NEGATIVE mg/dL Final  . Protein, ur 04/23/2018 NEGATIVE  NEGATIVE mg/dL Final  . Nitrite 04/23/2018 NEGATIVE  NEGATIVE Final  . Leukocytes, UA 04/23/2018 NEGATIVE  NEGATIVE Final  . RBC / HPF 04/23/2018 0-5  0 - 5 RBC/hpf Final  . WBC, UA 04/23/2018 6-10  0 - 5 WBC/hpf Final  . Bacteria, UA 04/23/2018 NONE SEEN  NONE SEEN Final  . Squamous Epithelial / LPF 04/23/2018 0-5  0 - 5 Final  . Mucus 04/23/2018 PRESENT   Final  . Hyaline Casts, UA 04/23/2018 PRESENT   Final   Performed at [facility], [address] [place],  [address]    04/26/18 Patient lives independently.  She has not been taking antibiotics for last 2 days because of nausea and vomiting.  She denies any abdominal pain today however she has labored breathing with tachypnea.  She has to stop after just saying a few words and struggle to take a deep breath to complete her sentences.  She is satting 89 to 90% on room air.  She has increased respiratory rate and bibasilar crackles all concerning for pulmonary edema.  Also her lab work was significant for a drop in her hemoglobin down to 8.7.  Given her underlying anemia, advanced age, increased work of breathing, and her history of congestive heart failure, I am concerned her  dyspnea is due to multifactorial causes including pulmonary edema, anemia, and congestive heart failure coupled with untreated diverticulitis  Past Medical History:  Diagnosis Date  . Aortic insufficiency   . Ascending aortic aneurysm ([place])    a. 4.6cm by CT 2018 - pt deferred any further evaluation/invasive management therefore follow-up deferred.  . Bladder infection    h/o  . Chronic atrial fibrillation (HCC)    a. not on anticoagulation due to prior GIB  . CKD (chronic kidney disease), stage III ([place])   . Coronary artery disease    a. mild nonobstructive by cath 2018.  Marland Kitchen DCIS (ductal carcinoma in situ) of breast 03/14/2013   right breast  . Dehydration    HISTORY   . Fall at home  09/10/12  . Hemorrhoids   . Hip fracture ([place])    hip surgery 2001  . History of blood clots    in leg  . History of knee surgery   . Hypertension   . Iron deficiency anemia, unspecified 03/14/2013   secondary to GI blood loss  . Kidney infection    h/o  . Melanoma of skin ([place]) 03/14/2013  . Melanoma of thigh ([place])    left  . Mini stroke ([place])   . Mitral regurgitation   . Nonischemic cardiomyopathy ([place])    a. EF 35-40% by echo in 03/2017 with cath showing nonobstructive CAD, suspected stress-induced.  . S/P colonoscopy March 2010   RMR: friable anal canal hemorrhoids, hyperplastic ascending polyp, adenomatous descending polyp   . Stomach ulcer    secondary to h.pylori, s/p treatment  . Thyroid condition   . Tricuspid regurgitation   . Venous stasis    edema   Past Surgical History:  Procedure Laterality Date  . APPENDECTOMY  1942  . BREAST LUMPECTOMY  1998  . CARDIAC CATHETERIZATION    . CATARACT EXTRACTION, BILATERAL    . CHOLECYSTECTOMY    . COLONOSCOPY  06/05/11   pancolonic diverticulosis/ileal reosion/abnormal anorectal junction s/p biopsy: path for small intestine and Ti was benign with non-villous atrophy, rectal biopsy with prominent prolapse changes, no acute inflammation  .  CORNEAL TRANSPLANT Bilateral 2013  . ENTEROSCOPY N/A 06/13/2013   ERX:VQMG Gastritis/GI BLEED MOST LIKELY DUE TO DUODENAL ULCERS, AND ? AVMs  . ESOPHAGOGASTRODUODENOSCOPY  06/05/11   small hiatal hernia; + H.PYLORI GASTRITIS, s/p 5 days of Pylera, unable to finish due to N/V  . ESOPHAGOGASTRODUODENOSCOPY N/A 06/13/2013   Dr. Oneida Alar- see enteroscopy  . ESOPHAGOGASTRODUODENOSCOPY (EGD) WITH ESOPHAGEAL DILATION N/A 06/08/2013   QQP:YPPJK hiatal hernia;  otherwise normal EGD s/p dilation  . GIVENS CAPSULE STUDY N/A 06/08/2013   Procedure: GIVENS CAPSULE STUDY;  Surgeon: Daneil Dolin, MD;  Location: [facility];  Service: Endoscopy;  Laterality: N/A;  . HEMORRHOID BANDING    . KNEE SURGERY Right 05/2006   total right  . LEFT HEART CATH AND CORONARY ANGIOGRAPHY N/A 03/18/2017   Procedure: LEFT HEART CATH AND CORONARY ANGIOGRAPHY;  Surgeon: Wellington Hampshire, MD;  Location: [facility];  Service: Cardiovascular;  Laterality: N/A;  . LEG SURGERY    . MELANOMA EXCISION Left 08/2006   left leg excision  . NM MYOCAR PERF WALL MOTION  03/27/2010   dipyridamole; small reversible basal to mid-septal defect (?artifact), post-stress EF 55%, low risk scan   . PARTIAL HYSTERECTOMY  1976  . TONSILLECTOMY    . TOTAL HIP ARTHROPLASTY Left 2001&2004    X 2 FOR LEFT HIP  . TRANSTHORACIC ECHOCARDIOGRAM  10/06/2012   EF 55-60%, mod eccentric hypertrophy, grade 2 diastolic dysfunction; mildly calcifed AV annulus with moderate regurg; aortic root mildly dilated; LA severely dailted; PA peak pressure 65mg  . VARICOSE VEIN SURGERY     Current Outpatient Medications on File Prior to Visit  Medication Sig Dispense Refill  . carvedilol (COREG) 3.125 MG tablet TAKE 1 TABLET BY MOUTH TWO  TIMES DAILY WITH MEALS 180 tablet 3  . ciprofloxacin (CIPRO) 500 MG tablet Take 1 tablet (500 mg total) by mouth 2 (two) times daily for 7 days. 14 tablet 0  . clopidogrel (PLAVIX) 75 MG tablet Take 1 tablet (75 mg total) by  mouth daily with breakfast. 90 tablet 3  . cyanocobalamin ([employer] VITAMIN B12) 2000 MCG tablet Take  1 tablet (2,000 mcg total) by mouth daily. 30 tablet 11  . folic acid (FOLVITE) 1 MG tablet TAKE 1 TABLET BY MOUTH EVERY DAY 30 tablet 11  . furosemide (LASIX) 20 MG tablet 04/09/18 dose decreased to 40 mg am and 20 mg pm 180 tablet 1  . gabapentin (NEURONTIN) 100 MG capsule TAKE 2 CAPSULES BY MOUTH 3  TIMES DAILY 540 capsule 1  . hydrocortisone (ANUSOL-HC) 25 MG suppository Place 1 suppository (25 mg total) rectally every 12 (twelve) hours. (Patient taking differently: Place 25 mg rectally 2 (two) times daily. ) 12 suppository 1  . metroNIDAZOLE (FLAGYL) 500 MG tablet Take 1 tablet (500 mg total) by mouth 2 (two) times daily for 7 days. 14 tablet 0  . mometasone (ELOCON) 0.1 % cream Apply 1 application topically daily. 45 g 0  . ondansetron (ZOFRAN) 4 MG tablet TAKE 1 TABLET BY MOUTH  EVERY 8 HOURS AS NEEDED FOR NAUSEA AND VOMITING 60 tablet 3  . pantoprazole (PROTONIX) 40 MG tablet Take 1 tablet (40 mg total) by mouth 2 (two) times daily. 180 tablet 3  . potassium chloride SA (K-DUR,KLOR-CON) 20 MEQ tablet TAKE 1 TABLET BY MOUTH  DAILY 90 tablet 3  . prednisoLONE acetate (PRED FORTE) 1 % ophthalmic suspension Place 1 drop into both eyes 4 (four) times daily.     . psyllium (METAMUCIL) 58.6 % packet Take 1 packet by mouth daily as needed (for constipation).     . sodium chloride (MURO 128) 2 % ophthalmic solution Place 1 drop into both eyes 3 (three) times daily.     . traMADol-acetaminophen (ULTRACET) 37.5-325 MG tablet TAKE 1 TABLET BY MOUTH EVERY 4 HOURS AS NEEDED FOR PAIN 180 tablet 0   No current facility-administered medications on file prior to visit.    Allergies  Allergen Reactions  . Codeine Diarrhea and Nausea Only  . Tape Other (See Comments)    SKIN IS VERY THIN AND TEARS AND BRUISES EASILY; PLEASE USE COBAN WRAP!!  . Diona Fanti [Aspirin] Rash and Other (See Comments)    Petechia   . Iron  Nausea And Vomiting    Oral iron causes nausea and vomiting  . Penicillins Rash    Has patient had a PCN reaction causing immediate rash, facial/tongue/throat swelling, SOB or lightheadedness with hypotension: Yes Has patient had a PCN reaction causing severe rash involving mucus membranes or skin necrosis: Unknown Has patient had a PCN reaction that required hospitalization: No Has patient had a PCN reaction occurring within the last 10 years: No If all of the above answers are "NO", then may proceed with Cephalosporin use.    Social History   Socioeconomic History  . Marital status: Widowed    Spouse name: Not on file  . Number of children: 5  . Years of education: 6  . Highest education level: Not on file  Occupational History    Employer: RETIRED  Social Needs  . Financial resource strain: Not on file  . Food insecurity:    Worry: Not on file    Inability: Not on file  . Transportation needs:    Medical: Not on file    Non-medical: Not on file  Tobacco Use  . Smoking status: Former Smoker    Packs/day: 1.50    Years: 20.00    Pack years: 30.00    Types: Cigarettes    Last attempt to quit: 07/05/1975    Years since quitting: 42.8  . Smokeless tobacco: Never Used  .  Tobacco comment: Quit smoking in 1975  Substance and Sexual Activity  . Alcohol use: No  . Drug use: No  . Sexual activity: Never  Lifestyle  . Physical activity:    Days per week: Not on file    Minutes per session: Not on file  . Stress: Not on file  Relationships  . Social connections:    Talks on phone: Not on file    Gets together: Not on file    Attends religious service: Not on file    Active member of club or organization: Not on file    Attends meetings of clubs or organizations: Not on file    Relationship status: Not on file  . Intimate partner violence:    Fear of current or ex partner: Not on file    Emotionally abused: Not on file    Physically abused: Not on file    Forced sexual  activity: Not on file  Other Topics Concern  . Not on file  Social History Narrative  . Not on file      Review of Systems  All other systems reviewed and are negative.      Objective:   Physical Exam  Constitutional: She appears well-developed and well-nourished.  Neck: No JVD present.  Cardiovascular: Normal rate and normal heart sounds. An irregularly irregular rhythm present.  Pulmonary/Chest: Effort normal. Tachypnea noted. No respiratory distress. She has no wheezes. She has rales in the right lower field and the left lower field. She exhibits no tenderness.  Abdominal: Soft. Bowel sounds are normal. She exhibits no distension and no mass. There is no tenderness. There is no rebound and no guarding.  Musculoskeletal: She exhibits no edema.  Lymphadenopathy:    She has no cervical adenopathy.  Vitals reviewed. Patient has increased work of breathing.  She has labored breathing even talking to me.  Pulse oximetry is 89 to 90% on room air.  Physical exam is significant for bibasilar crackles and +2 pitting edema in both legs.  She appears fluid overloaded on exam        Assessment & Plan:  Diverticulitis - Plan: CBC with Differential/Platelet, COMPLETE METABOLIC PANEL WITH GFR, C-reactive protein Dyspnea  Patient's abdominal exam is benign today.  She denies any tenderness in the abdomen at all however she has been unable to take oral antibiotics.  I am more concerned about her increased respiratory rate, borderline hypoxia, bibasilar crackles and increased work of breathing.  I recommended that she go to the emergency room for chest x-ray to evaluate for pulmonary edema likely IV diuresis.  If her hemoglobin has fallen further she may need a transfusion.  She also likely needs IV antibiotics given the fact she is unable to tolerate oral antibiotics.  Will notify any pen emergency room of her arrival.

## 2018-04-26 NOTE — Progress Notes (Signed)
EKG completed and placed on pts chart.  

## 2018-04-26 NOTE — H&P (Signed)
History and Physical    [name] [name]:[id number] DOB: 1923/01/29 DOA: 04/26/2018  Referring MD/NP/PA: Dr. Reather Converse  PCP: [name], MD  Patient coming from: home   Chief Complaint: Shortness of breath, increased lower extremity swelling, positive orthopnea.  HPI: [name] [name] is a 84 y.o. female with a past medical history significant for systolic heart failure, chronic kidney disease stage IV, chronic atrial fibrillation (not on anticoagulation due to prior GI bleed), hypertension, gastroesophageal reflux disease, and recent diagnosis of diverticulitis; who presented to the emergency department secondary to increased shortness of breath, lower extremity edema, orthopnea and per family members some confusion while attempting to use prescribed antibiotics for diverticulitis.  Patient was seen in the emergency department on 04/23/18 due to nausea and left lower quadrant pain; Diagnosis of acute diverticulitis was made and the patient discharged home on Flagyl and full dose ciprofloxacin.  Family reported by Saturday night patient was experiencing limits of confusion around using antibiotics and they stop given to her.  They also noticed increased swelling in her lower extremities and shortness of breath.  They follow-up with her outpatient PCP and during that visit she was not complaining of any nausea, vomiting or abdominal pain.  Mentation appears to be okay while off antibiotics.  PCP noticed increased rales SOB and LE edema. Patient sent to ED for further evaluation with concerns of CHF exacerbation.  Patient expressed difficulty laying down flat, shortness of breath at rest and with minimal activity and Worsening lower extremity edema. Family and Patient reported no CP, chills, no fever, no hematemesis, no dysuria, no hematuria, no melena, no hematochezia, no headaches, blurred vision or focal weakness.  In the ED, patient blood work demonstrated elevated BNP and chest x-ray with  vascular congestion.  On physical exam patient found with positive rails, increased lower extremity, mild tachypnea and positive orthopnea.  IV Lasix was given and [facility] contacted to admit patient for further evaluation and management.  Past Medical/Surgical History: Past Medical History:  Diagnosis Date  . Aortic insufficiency   . Ascending aortic aneurysm ([place])    a. 4.6cm by CT 2018 - pt deferred any further evaluation/invasive management therefore follow-up deferred.  . Bladder infection    h/o  . CHF (congestive heart failure) ([place])   . Chronic atrial fibrillation (HCC)    a. not on anticoagulation due to prior GIB  . CKD (chronic kidney disease), stage III ([place])   . Coronary artery disease    a. mild nonobstructive by cath 2018.  Marland Kitchen DCIS (ductal carcinoma in situ) of breast 03/14/2013   right breast  . Dehydration    HISTORY   . Fall at home 09/10/12  . Hemorrhoids   . Hip fracture ([place])    hip surgery 2001  . History of blood clots    in leg  . History of knee surgery   . Hypertension   . Iron deficiency anemia, unspecified 03/14/2013   secondary to GI blood loss  . Kidney infection    h/o  . Melanoma of skin ([place]) 03/14/2013  . Melanoma of thigh ([place])    left  . Mini stroke ([place])   . Mitral regurgitation   . Nonischemic cardiomyopathy ([place])    a. EF 35-40% by echo in 03/2017 with cath showing nonobstructive CAD, suspected stress-induced.  . S/P colonoscopy March 2010   RMR: friable anal canal hemorrhoids, hyperplastic ascending polyp, adenomatous descending polyp   . Stomach ulcer    secondary to h.pylori,  s/p treatment  . Thyroid condition   . Tricuspid regurgitation   . Venous stasis    edema    Past Surgical History:  Procedure Laterality Date  . APPENDECTOMY  1942  . BREAST LUMPECTOMY  1998  . CARDIAC CATHETERIZATION    . CATARACT EXTRACTION, BILATERAL    . CHOLECYSTECTOMY    . COLONOSCOPY  06/05/11   pancolonic diverticulosis/ileal reosion/abnormal  anorectal junction s/p biopsy: path for small intestine and Ti was benign with non-villous atrophy, rectal biopsy with prominent prolapse changes, no acute inflammation  . CORNEAL TRANSPLANT Bilateral 2013  . ENTEROSCOPY N/A 06/13/2013   NUU:VOZD Gastritis/GI BLEED MOST LIKELY DUE TO DUODENAL ULCERS, AND ? AVMs  . ESOPHAGOGASTRODUODENOSCOPY  06/05/11   small hiatal hernia; + H.PYLORI GASTRITIS, s/p 5 days of Pylera, unable to finish due to N/V  . ESOPHAGOGASTRODUODENOSCOPY N/A 06/13/2013   Dr. Oneida Alar- see enteroscopy  . ESOPHAGOGASTRODUODENOSCOPY (EGD) WITH ESOPHAGEAL DILATION N/A 06/08/2013   GUY:QIHKV hiatal hernia;  otherwise normal EGD s/p dilation  . GIVENS CAPSULE STUDY N/A 06/08/2013   Procedure: GIVENS CAPSULE STUDY;  Surgeon: Daneil Dolin, MD;  Location: [facility];  Service: Endoscopy;  Laterality: N/A;  . HEMORRHOID BANDING    . KNEE SURGERY Right 05/2006   total right  . LEFT HEART CATH AND CORONARY ANGIOGRAPHY N/A 03/18/2017   Procedure: LEFT HEART CATH AND CORONARY ANGIOGRAPHY;  Surgeon: Wellington Hampshire, MD;  Location: [facility];  Service: Cardiovascular;  Laterality: N/A;  . LEG SURGERY    . MELANOMA EXCISION Left 08/2006   left leg excision  . NM MYOCAR PERF WALL MOTION  03/27/2010   dipyridamole; small reversible basal to mid-septal defect (?artifact), post-stress EF 55%, low risk scan   . PARTIAL HYSTERECTOMY  1976  . TONSILLECTOMY    . TOTAL HIP ARTHROPLASTY Left 2001&2004    X 2 FOR LEFT HIP  . TRANSTHORACIC ECHOCARDIOGRAM  10/06/2012   EF 55-60%, mod eccentric hypertrophy, grade 2 diastolic dysfunction; mildly calcifed AV annulus with moderate regurg; aortic root mildly dilated; LA severely dailted; PA peak pressure 22mHg  . VARICOSE VEIN SURGERY      Social History:  reports that she quit smoking about 42 years ago. Her smoking use included cigarettes. She has a 30.00 pack-year smoking history. She has never used smokeless tobacco. She reports that she  does not drink alcohol or use drugs.  Allergies: Allergies  Allergen Reactions  . Codeine Diarrhea and Nausea Only  . Tape Other (See Comments)    SKIN IS VERY THIN AND TEARS AND BRUISES EASILY; PLEASE USE COBAN WRAP!!  . Diona Fanti [Aspirin] Rash and Other (See Comments)    Petechia   . Iron Nausea And Vomiting    Oral iron causes nausea and vomiting  . Penicillins Rash    Has patient had a PCN reaction causing immediate rash, facial/tongue/throat swelling, SOB or lightheadedness with hypotension: Yes Has patient had a PCN reaction causing severe rash involving mucus membranes or skin necrosis: Unknown Has patient had a PCN reaction that required hospitalization: No Has patient had a PCN reaction occurring within the last 10 years: No If all of the above answers are "NO", then may proceed with Cephalosporin use.     Family History:  Family History  Problem Relation Age of Onset  . Breast cancer Daughter        also hyperlipidemia  . Breast cancer Daughter        also hyperlipidemia  . Asthma  Mother   . Multiple sclerosis Child   . Hyperlipidemia Child   . Colon cancer Neg Hx     Prior to Admission medications   Medication Sig Start Date End Date Taking? Authorizing Provider  carvedilol (COREG) 3.125 MG tablet TAKE 1 TABLET BY MOUTH TWO  TIMES DAILY WITH MEALS 01/25/18  Yes [name], MD  clopidogrel (PLAVIX) 75 MG tablet Take 1 tablet (75 mg total) by mouth daily with breakfast. 11/05/17  Yes [name], MD  cyanocobalamin ([employer] VITAMIN B12) 2000 MCG tablet Take 1 tablet (2,000 mcg total) by mouth daily. 04/06/18  Yes [name], MD  docusate sodium (COLACE) 100 MG capsule Take 200 mg by mouth 2 (two) times daily.   Yes [provider]  folic acid (FOLVITE) 1 MG tablet TAKE 1 TABLET BY MOUTH EVERY DAY 11/17/17  Yes Holley Bouche, NP  furosemide (LASIX) 20 MG tablet 04/09/18 dose decreased to 40 mg am and 20 mg pm 04/09/18  Yes Branch, Alphonse Guild, MD    gabapentin (NEURONTIN) 100 MG capsule TAKE 2 CAPSULES BY MOUTH 3  TIMES DAILY 02/03/18  Yes Carole Civil, MD  hydrocortisone (ANUSOL-HC) 25 MG suppository Place 1 suppository (25 mg total) rectally every 12 (twelve) hours. Patient taking differently: Place 25 mg rectally 2 (two) times daily.  05/16/15  Yes Annitta Needs, NP  mometasone (ELOCON) 0.1 % cream Apply 1 application topically daily. 08/11/17  Yes [name], MD  ondansetron (ZOFRAN) 4 MG tablet TAKE 1 TABLET BY MOUTH  EVERY 8 HOURS AS NEEDED FOR NAUSEA AND VOMITING 02/08/18  Yes [name], MD  pantoprazole (PROTONIX) 40 MG tablet Take 1 tablet (40 mg total) by mouth 2 (two) times daily. 02/08/18  Yes [name], MD  potassium chloride SA (K-DUR,KLOR-CON) 20 MEQ tablet TAKE 1 TABLET BY MOUTH  DAILY Patient taking differently: One in the morning and one every other afternoon. 01/29/18  Yes [name], MD  prednisoLONE acetate (PRED FORTE) 1 % ophthalmic suspension Place 1 drop into both eyes 4 (four) times daily.    Yes [provider]  sodium chloride (MURO 128) 2 % ophthalmic solution Place 1 drop into both eyes 3 (three) times daily.    Yes [provider]  traMADol-acetaminophen (ULTRACET) 37.5-325 MG tablet TAKE 1 TABLET BY MOUTH EVERY 4 HOURS AS NEEDED FOR PAIN 03/26/18  Yes [name], MD    Review of Systems:  Negative except as otherwise mentioned in HPI.    Physical Exam: Vitals:   04/26/18 1100 04/26/18 1200 04/26/18 1230 04/26/18 1342  BP: (!) 103/47 (!) 114/55 (!) 117/54 109/89  Pulse: 62 60 70 76  Resp: 15 (!) 21 18 20   Temp:    98 F (36.7 C)  TempSrc:    Oral  SpO2: 94% 94% 95% 92%  Weight:      Height:        Constitutional: Afebrile, no chest pain, no shortness of breath, no nausea vomiting.  Patient reports very little abdominal discomfort around postoperative wound.  Colostomy bag with stool in place.  Still having drainage out of perirectal wound. Eyes:  PERRL, lids and conjunctivae normal, no icterus, no nystagmus. ENMT: Patient hard of hearing.  Mucous membranes are moist. Posterior pharynx clear of any exudate or lesions. No Thrush Neck: normal, supple, no masses, no thyromegaly, mild JVD appreciated on exam. Respiratory: Bibasilar crackles appreciated on exam, no wheezing, no using accessory muscles.  Positive tachypnea.  Good oxygen saturation on room air. Cardiovascular: Rate controlled. Positive soft murmur; no Rubs, no gallops.  Abdomen: no tenderness to palpation, no masses palpated. No hepatosplenomegaly. Bowel sounds positive. No guarding. Musculoskeletal: no clubbing / cyanosis. 2++ edema bilaterally, No joint deformity upper and lower extremities. Good ROM. Normal muscle tone.  Skin: no rashes, no petechiae; chronic lower extremities stasis dermatitis. Neurologic: CN 2-12 grossly intact (except for impaired hearing). Sensation intact, DTR normal. Strength 4/5 in all 4 due to poor effort and chronic hip pain/ underlying deconditioning. Marland Kitchen  Psychiatric: Normal judgment and insight. Alert and oriented x 3. Normal mood.    Labs on Admission: I have personally reviewed the following labs and imaging studies  CBC: Recent Labs  Lab 04/23/18 1106 04/26/18 1114  WBC 8.0 6.9  NEUTROABS 5.9 5.5  HGB 8.9* 9.3*  HCT 29.7* 30.8*  MCV 94.6 95.1  PLT 172 440   Basic Metabolic Panel: Recent Labs  Lab 04/23/18 1106 04/26/18 1114  NA 138 138  K 3.8 3.6  CL 101 102  CO2 30 27  GLUCOSE 97 103*  BUN 32* 32*  CREATININE 1.87* 1.97*  CALCIUM 8.1* 8.2*   GFR: Estimated Creatinine Clearance: 18.3 mL/min (A) (by C-G formula based on SCr of 1.97 mg/dL (H)).   Liver Function Tests: Recent Labs  Lab 04/23/18 1106 04/26/18 1114  AST 14* 24  ALT 9 12  ALKPHOS 61 64  BILITOT 0.9 0.8  PROT 6.7 6.7  ALBUMIN 3.1* 3.1*   Recent Labs  Lab 04/23/18 1106  LIPASE 24   Cardiac Enzymes: Recent Labs  Lab 04/26/18 1114  TROPONINI <0.03    Urine analysis:    Component Value Date/Time   COLORURINE YELLOW 04/23/2018 [place] 04/23/2018 1032   LABSPEC 1.015 04/23/2018 1032   PHURINE 5.0 04/23/2018 1032   GLUCOSEU NEGATIVE 04/23/2018 1032   HGBUR SMALL (A) 04/23/2018 1032   BILIRUBINUR NEGATIVE 04/23/2018 1032   KETONESUR NEGATIVE 04/23/2018 1032   PROTEINUR NEGATIVE 04/23/2018 1032   UROBILINOGEN 1.0 12/18/2014 1735   NITRITE NEGATIVE 04/23/2018 1032   LEUKOCYTESUR NEGATIVE 04/23/2018 1032   Radiological Exams on Admission: Dg Chest 2 View  Result Date: 04/26/2018 CLINICAL DATA:  Shortness of breath EXAM: CHEST - 2 VIEW COMPARISON:  None FINDINGS: The lungs are hyperinflated likely secondary to COPD. There is hazy left perihilar airspace disease which may reflect atelectasis versus pneumonia. There is no pleural effusion or pneumothorax. There is stable cardiomegaly. The osseous structures are unremarkable. IMPRESSION: Hazy left perihilar airspace disease which may reflect atelectasis versus pneumonia. Electronically Signed   By: Kathreen Devoid   On: 04/26/2018 11:47    EKG: none   Assessment/Plan 1-CHF NYHA class III, acute on chronic, systolic (HCC) -Patient with shortness of breath, increased lower extremity edema, elevated BNP, Rales on physical examination and vascular congestion on x-ray. -Place on telemetry, daily weights, strict intake and output. -Continue beta-blocker -start IV Lasix -will check TSH, cycle troponin and repeat 2-D echo -no ACE or ARB, in the setting of soft BP on admission and chronic renal failure.  -follow clinical response  2-Atrial fibrillation, chronic (HCC) -continue b-blocker -CHADsVASC score 4; not on anticoagulation. -rate controlled  -monitor on telemetry   3-Hypertension -soft, but stable -will continue metoprolol -follow VS (especially with acute diuresis) -heart healthy diet ordered   4-Chronic pain syndrome -in the setting of chronic OA and right hip  fracture -will continue home neurontin, tylenol and tramadol -IV  morphine for severe uncontrolled pain  5-anemia of chronic disease  -no signs of overt bleeding appreciated or reported. -Hgb 9.3 -follow Hgb trend   6-Diverticulitis large intestine -recent diagnosis -currently w/o abd pain, nausea or vomiting. -will use flagyl TID and renally adjusted dose of ciprofloxacin  -advance diet and follow tolerance.  7-Chronic renal failure, stage 4 (severe) (HCC)  -Cr and GFR essentially at baseline -will follow renal function closely while diuresing her  8-GERD -continue PPI  9-hx of CAD -no CP currently -first troponin neg -will cycle troponin and monitor on telemetry -continue b-blocker and plavix.  10-intraocular hypertension -continue Pred-Forte eye drops QID  DVT prophylaxis: Heparin. Code Status: DNR/DNI. Family Communication: Daughter at bedside. Disposition Plan: To be determined; hopefully discharge back home once breathing status at baseline. Consults called: none  Admission status: Inpatient, telemetry, LOS more than 2 midnights.   Time Spent: 70 minutes.  Barton Dubois MD [facility] Pager [phone]  If 7PM-7AM, please contact night-coverage [email or web address] Password [facility]  04/26/2018, 6:03 PM

## 2018-04-26 NOTE — ED Triage Notes (Signed)
Pt was recently dx with diverticulitis.  Went to pcp this morning for recheck of diverticulitis due to inability to tolerate abx and was told had fluid on lungs and to return here.

## 2018-04-27 ENCOUNTER — Inpatient Hospital Stay (HOSPITAL_COMMUNITY): Payer: Medicare Other

## 2018-04-27 DIAGNOSIS — I340 Nonrheumatic mitral (valve) insufficiency: Secondary | ICD-10-CM

## 2018-04-27 LAB — BASIC METABOLIC PANEL
ANION GAP: 10 (ref 5–15)
BUN: 28 mg/dL — ABNORMAL HIGH (ref 8–23)
CALCIUM: 7.6 mg/dL — AB (ref 8.9–10.3)
CO2: 26 mmol/L (ref 22–32)
Chloride: 102 mmol/L (ref 98–111)
Creatinine, Ser: 1.74 mg/dL — ABNORMAL HIGH (ref 0.44–1.00)
GFR, EST AFRICAN AMERICAN: 28 mL/min — AB (ref >60)
GFR, EST NON AFRICAN AMERICAN: 24 mL/min — AB (ref >60)
Glucose, Bld: 97 mg/dL (ref 70–99)
POTASSIUM: 3.7 mmol/L (ref 3.5–5.1)
SODIUM: 138 mmol/L (ref 135–145)

## 2018-04-27 LAB — ECHOCARDIOGRAM COMPLETE
Height: 65 in
Weight: 2724.32 oz

## 2018-04-27 LAB — TROPONIN I: Troponin I: <0.03 ng/mL (ref <0.03)

## 2018-04-27 MED ORDER — LIDOCAINE 5 % EX PTCH
1.0000 | MEDICATED_PATCH | Freq: Two times a day (BID) | CUTANEOUS | Status: DC
  Administered 2018-04-27 – 2018-05-01 (×9): 1 via TRANSDERMAL
  Filled 2018-04-27 (×15): qty 1

## 2018-04-27 MED ORDER — SODIUM CHLORIDE 0.9 % IV SOLN
510.0000 mg | Freq: Once | INTRAVENOUS | Status: AC
  Administered 2018-04-28: 510 mg via INTRAVENOUS
  Filled 2018-04-27: qty 17

## 2018-04-27 MED ORDER — CIPROFLOXACIN HCL 250 MG PO TABS
500.0000 mg | ORAL_TABLET | Freq: Every day | ORAL | Status: DC
  Administered 2018-04-28: 500 mg via ORAL
  Filled 2018-04-27: qty 2

## 2018-04-27 NOTE — Progress Notes (Signed)
PROGRESS NOTE    [name] [name]  YIF:[id number] DOB: [birth year] DOA: 04/26/2018 PCP: [name], MD     Brief Narrative:  84 y.o. female with a past medical history significant for systolic heart failure, chronic kidney disease stage IV, chronic atrial fibrillation (not on anticoagulation due to prior GI bleed), hypertension, gastroesophageal reflux disease, and recent diagnosis of diverticulitis; who presented to the emergency department secondary to increased shortness of breath, lower extremity edema, orthopnea and per family members some confusion while attempting to use prescribed antibiotics for diverticulitis.  Patient was seen in the emergency department on 04/23/18 due to nausea and left lower quadrant pain; Diagnosis of acute diverticulitis was made and the patient discharged home on Flagyl and full dose ciprofloxacin.  Family reported by Saturday night patient was experiencing limits of confusion around using antibiotics and they stop given to her.  They also noticed increased swelling in her lower extremities and shortness of breath.  They follow-up with her outpatient PCP and during that visit she was not complaining of any nausea, vomiting or abdominal pain.  Mentation appears to be okay while off antibiotics.  PCP noticed increased rales SOB and LE edema. Patient sent to ED for further evaluation with concerns of CHF exacerbation.  Patient expressed difficulty laying down flat, shortness of breath at rest and with minimal activity and Worsening lower extremity edema. Family and Patient reported no CP, chills, no fever, no hematemesis, no dysuria, no hematuria, no melena, no hematochezia, no headaches, blurred vision or focal weakness.   Assessment & Plan: 1-acute on chronic systolic heart failure.  Patient with a prior history of systolic heart failure. -Repeat echo demonstrated improvement in her ejection fraction and just showing mild diastolic component at this time. -No  chest pain, breathing appropriately improving with diuresis. -Negative troponin. -No ischemic changes appreciated on EKG or telemetry. -Continue daily weights, follow low-sodium diet and strict intake and output.   -Continue to follow renal function trend and electrolytes. -Hopefully she will be able to be transitioned back to adjusted dose of Lasix in the next 24 hours. -Continue beta-blocker -No ACE or ARB due to soft blood pressure on admission and chronic renal failure.  2-Atrial fibrillation, chronic (HCC) -Rate controlled and is stable -Continue beta-blocker. -CHADsVASC score 4 -Not on chronic anticoagulation.  3-Hypertension -Soft, but is stable. -Continue to monitor vital signs -Continue current dose of metoprolol.  4-Chronic pain syndrome -In the setting of osteoarthritis and chronic right hip fracture Continue Neurontin, Tylenol and tramadol -Continue IV morphine for severe uncontrolled pain -Apply Lidoderm patch -Appreciate physical therapy assistance and evaluation.    5-anemia of chronic disease -No signs of overt bleeding appreciated or reported. -Hemoglobin has remained stable. -Patient is due for IV iron infusion -Will ask pharmacy to dose.  6-large intestine diverticulitis -Stable and improving -Complete oral antibiotic regimen. -Currently denying nausea, vomiting and abdominal pain.  7-chronic renal failure stage IV -Creatinine has improved with diuresis; suggesting poor perfusion in the setting of acute CHF. -Continue to follow creatinine trend with ongoing diuresis.  8-gastroesophageal reflux disease -Continue PPI.  9-intraocular hypertension -Continue Pred-Forte eyedrops 4 times daily.     Diverticulitis large intestine   Chronic renal failure, stage 4 (severe) (HCC)   DVT prophylaxis: Heparin. Code Status: DNR/DNI Family Communication: Daughters at bedside. Disposition Plan: Appreciate evaluation by physical therapy, at this time  recommendations are given for short-term rehabilitation at skilled nursing facility at time of discharge.  We will continue IV Lasix for  another 24 hours to accomplish full diuresis and stabilization of her heart failure.  Continue pain management for right hip.  Continue oral antibiotics for acute diverticulitis.  Consultants:   None   Procedures:   2-D echo - Left ventricle: The cavity size was normal. Wall thickness was   increased in a pattern of mild LVH. Systolic function was normal.   The estimated ejection fraction was in the range of 60% to 65%.   Wall motion was normal; there were no regional wall motion   abnormalities. The study is not technically sufficient to allow   evaluation of LV diastolic function. Doppler parameters are   consistent with high ventricular filling pressure. - Aortic valve: Mildly calcified annulus. Trileaflet; mildly   calcified leaflets. There was mild stenosis. There was moderate   regurgitation. Valve area (VTI): 1.54 cm^2. Valve area (Vmax):   1.52 cm^2. Valve area (Vmean): 1.52 cm^2. - Aorta: Mildly dilated aortic root. - Mitral valve: Mildly calcified annulus. There was mild   regurgitation. - Left atrium: The atrium was moderately to severely dilated. - Right atrium: The atrium was moderately dilated. - Atrial septum: No defect or patent foramen ovale was identified. - Tricuspid valve: There was moderate regurgitation. - Pulmonary arteries: PA peak pressure: 43 mm Hg (S).  Antimicrobials:  Anti-infectives (From admission, onward)   Start     Dose/Rate Route Frequency Ordered Stop   04/28/18 1000  ciprofloxacin (CIPRO) tablet 500 mg     500 mg Oral Daily 04/27/18 1017     04/27/18 0800  ciprofloxacin (CIPRO) tablet 250 mg  Status:  Discontinued     250 mg Oral 2 times daily 04/26/18 1228 04/27/18 1017   04/26/18 1400  metroNIDAZOLE (FLAGYL) tablet 500 mg     500 mg Oral Every 8 hours 04/26/18 1228 05/03/18 1359   04/26/18 1215   ciprofloxacin (CIPRO) IVPB 400 mg     400 mg 200 mL/hr over 60 Minutes Intravenous  Once 04/26/18 1207 04/26/18 1312       Subjective: Reporting improvement in her breathing, no chest pain, no nausea, no vomiting, no abdominal pain.  Mild distress secondary to ongoing right hip pain.  Objective: Vitals:   04/26/18 2147 04/27/18 0616 04/27/18 0900 04/27/18 1451  BP: (!) 117/49 113/65  124/64  Pulse: 72 85  78  Resp: 20 20  20   Temp: 98.4 F (36.9 C) 99.6 F (37.6 C)  98.9 F (37.2 C)  TempSrc: Oral Oral  Oral  SpO2: 91% (!) 89%  92%  Weight:   77.2 kg   Height:        Intake/Output Summary (Last 24 hours) at 04/27/2018 1821 Last data filed at 04/27/2018 0900 Gross per 24 hour  Intake 480 ml  Output -  Net 480 ml   Filed Weights   04/26/18 1026 04/27/18 0900  Weight: 77.1 kg 77.2 kg    Examination: General exam: Alert, awake, oriented x 3; significantly hard of hearing and complaining of right hip pain.  Reports breathing is improved. No CP, no abd pain, no nausea, no vomiting.  Respiratory system: Improved air movement bilaterally, decreased breath sounds at the bases, no frank crackles on exam today.  No wheezing.  Positive for scattered rhonchi.   Cardiovascular system: Rate controlled, positive murmur, no rubs, no gallops, mild JVD appreciated on today's exam Gastrointestinal system: Abdomen is nondistended, soft and nontender on palpation. No organomegaly or masses felt. Normal bowel sounds heard. Central nervous system: Alert and oriented.  No focal neurological deficits. Extremities: No cyanosis or clubbing; 1+ edema bilaterally. Skin: No open wounds.  Chronic stasis dermatitis appreciated on both legs. Psychiatry: Judgement and insight appear normal. Mood & affect appropriate.    Data Reviewed: I have personally reviewed following labs and imaging studies  CBC: Recent Labs  Lab 04/23/18 1106 04/26/18 1114  WBC 8.0 6.9  NEUTROABS 5.9 5.5  HGB 8.9* 9.3*    HCT 29.7* 30.8*  MCV 94.6 95.1  PLT 172 761   Basic Metabolic Panel: Recent Labs  Lab 04/23/18 1106 04/26/18 1114 04/27/18 0547  NA 138 138 138  K 3.8 3.6 3.7  CL 101 102 102  CO2 30 27 26   GLUCOSE 97 103* 97  BUN 32* 32* 28*  CREATININE 1.87* 1.97* 1.74*  CALCIUM 8.1* 8.2* 7.6*   GFR: Estimated Creatinine Clearance: 20.8 mL/min (A) (by C-G formula based on SCr of 1.74 mg/dL (H)).   Liver Function Tests: Recent Labs  Lab 04/23/18 1106 04/26/18 1114  AST 14* 24  ALT 9 12  ALKPHOS 61 64  BILITOT 0.9 0.8  PROT 6.7 6.7  ALBUMIN 3.1* 3.1*   Recent Labs  Lab 04/23/18 1106  LIPASE 24   Cardiac Enzymes: Recent Labs  Lab 04/26/18 1114 04/26/18 1817 04/27/18 0008 04/27/18 0548  TROPONINI <0.03 <0.03 <0.03 <0.03   Thyroid Function Tests: Recent Labs    04/26/18 1817  TSH 3.012   Urine analysis:    Component Value Date/Time   COLORURINE YELLOW 04/23/2018 [place] 04/23/2018 1032   LABSPEC 1.015 04/23/2018 1032   PHURINE 5.0 04/23/2018 1032   GLUCOSEU NEGATIVE 04/23/2018 1032   HGBUR SMALL (A) 04/23/2018 1032   BILIRUBINUR NEGATIVE 04/23/2018 1032   KETONESUR NEGATIVE 04/23/2018 1032   PROTEINUR NEGATIVE 04/23/2018 1032   UROBILINOGEN 1.0 12/18/2014 1735   NITRITE NEGATIVE 04/23/2018 1032   LEUKOCYTESUR NEGATIVE 04/23/2018 1032    Radiology Studies: Dg Chest 2 View  Result Date: 04/26/2018 CLINICAL DATA:  Shortness of breath EXAM: CHEST - 2 VIEW COMPARISON:  None FINDINGS: The lungs are hyperinflated likely secondary to COPD. There is hazy left perihilar airspace disease which may reflect atelectasis versus pneumonia. There is no pleural effusion or pneumothorax. There is stable cardiomegaly. The osseous structures are unremarkable. IMPRESSION: Hazy left perihilar airspace disease which may reflect atelectasis versus pneumonia. Electronically Signed   By: Kathreen Devoid   On: 04/26/2018 11:47    Scheduled Meds: . carvedilol  3.125 mg  Oral BID WC  . [START ON 04/28/2018] ciprofloxacin  500 mg Oral Daily  . clopidogrel  75 mg Oral Q breakfast  . docusate sodium  100 mg Oral BID  . folic acid  1 mg Oral Daily  . furosemide  20 mg Intravenous Q12H  . gabapentin  200 mg Oral TID  . heparin injection (subcutaneous)  5,000 Units Subcutaneous Q8H  . lidocaine  1 patch Transdermal Q12H  . metroNIDAZOLE  500 mg Oral Q8H  . pantoprazole  40 mg Oral BID  . potassium chloride SA  10 mEq Oral Daily  . prednisoLONE acetate  1 drop Both Eyes QID  . sodium chloride  1 drop Both Eyes TID  . sodium chloride flush  3 mL Intravenous Q12H  . cyanocobalamin  2,000 mcg Oral Daily   Continuous Infusions: . sodium chloride       LOS: 1 day    Time spent: 30 minutes  Barton Dubois, MD [facility] Pager [phone]  If 7PM-7AM,  please contact night-coverage [email or web address] Password [facility] 04/27/2018, 6:21 PM

## 2018-04-27 NOTE — Plan of Care (Signed)
  Problem: Acute Rehab PT Goals(only PT should resolve) Goal: Pt Will Go Supine/Side To Sit Outcome: Progressing Flowsheets (Taken 04/27/2018 1418) Pt will go Supine/Side to Sit: with min guard assist Goal: Patient Will Transfer Sit To/From Stand Outcome: Progressing Flowsheets (Taken 04/27/2018 1418) Patient will transfer sit to/from stand: with min guard assist Goal: Pt Will Transfer Bed To Chair/Chair To Bed Outcome: Progressing Flowsheets (Taken 04/27/2018 1418) Pt will Transfer Bed to Chair/Chair to Bed: min guard assist Goal: Pt Will Ambulate Outcome: Progressing Flowsheets (Taken 04/27/2018 1418) Pt will Ambulate: 75 feet; with min guard assist; with rolling walker   2:19 PM, 04/27/18 Lonell Grandchild, MPT Physical Therapist with [facility] [phone] office [phone] mobile phone

## 2018-04-27 NOTE — Progress Notes (Signed)
MEDICATION RELATED CONSULT NOTE - INITIAL   Pharmacy Consult for IV Iron replacement Indication: Iron deficient anemia  Allergies  Allergen Reactions  . Codeine Diarrhea and Nausea Only  . Tape Other (See Comments)    SKIN IS VERY THIN AND TEARS AND BRUISES EASILY; PLEASE USE COBAN WRAP!!  . Diona Fanti [Aspirin] Rash and Other (See Comments)    Petechia   . Iron Nausea And Vomiting    Oral iron causes nausea and vomiting  . Penicillins Rash    Has patient had a PCN reaction causing immediate rash, facial/tongue/throat swelling, SOB or lightheadedness with hypotension: Yes Has patient had a PCN reaction causing severe rash involving mucus membranes or skin necrosis: Unknown Has patient had a PCN reaction that required hospitalization: No Has patient had a PCN reaction occurring within the last 10 years: No If all of the above answers are "NO", then may proceed with Cephalosporin use.     Patient Measurements: Height: 5\' 5"  (165.1 cm) Weight: 170 lb 4.3 oz (77.2 kg) IBW/kg (Calculated) : 57  Vital Signs: Temp: 98.9 F (37.2 C) (09/24 1451) Temp Source: Oral (09/24 1451) BP: 124/64 (09/24 1451) Pulse Rate: 78 (09/24 1451) Intake/Output from previous day: 09/23 0701 - 09/24 0700 In: 240 [P.O.:240] Out: -  Intake/Output from this shift: No intake/output data recorded.  Labs: Recent Labs    04/26/18 1114 04/27/18 0547  WBC 6.9  --   HGB 9.3*  --   HCT 30.8*  --   PLT 217  --   CREATININE 1.97* 1.74*  ALBUMIN 3.1*  --   PROT 6.7  --   AST 24  --   ALT 12  --   ALKPHOS 64  --   BILITOT 0.8  --    Estimated Creatinine Clearance: 20.8 mL/min (A) (by C-G formula based on SCr of 1.74 mg/dL (H)).   Microbiology: No results found for this or any previous visit (from the past 720 hour(s)).  Medical History: Past Medical History:  Diagnosis Date  . Aortic insufficiency   . Ascending aortic aneurysm ([place])    a. 4.6cm by CT 2018 - pt deferred any further  evaluation/invasive management therefore follow-up deferred.  . Bladder infection    h/o  . CHF (congestive heart failure) ([place])   . Chronic atrial fibrillation (HCC)    a. not on anticoagulation due to prior GIB  . CKD (chronic kidney disease), stage III ([place])   . Coronary artery disease    a. mild nonobstructive by cath 2018.  Marland Kitchen DCIS (ductal carcinoma in situ) of breast 03/14/2013   right breast  . Dehydration    HISTORY   . Fall at home 09/10/12  . Hemorrhoids   . Hip fracture ([place])    hip surgery 2001  . History of blood clots    in leg  . History of knee surgery   . Hypertension   . Iron deficiency anemia, unspecified 03/14/2013   secondary to GI blood loss  . Kidney infection    h/o  . Melanoma of skin ([place]) 03/14/2013  . Melanoma of thigh ([place])    left  . Mini stroke ([place])   . Mitral regurgitation   . Nonischemic cardiomyopathy ([place])    a. EF 35-40% by echo in 03/2017 with cath showing nonobstructive CAD, suspected stress-induced.  . S/P colonoscopy March 2010   RMR: friable anal canal hemorrhoids, hyperplastic ascending polyp, adenomatous descending polyp   . Stomach ulcer    secondary to h.pylori,  s/p treatment  . Thyroid condition   . Tricuspid regurgitation   . Venous stasis    edema    Medications:  Medications Prior to Admission  Medication Sig Dispense Refill Last Dose  . carvedilol (COREG) 3.125 MG tablet TAKE 1 TABLET BY MOUTH TWO  TIMES DAILY WITH MEALS 180 tablet 3 04/26/2018 at 0900  . clopidogrel (PLAVIX) 75 MG tablet Take 1 tablet (75 mg total) by mouth daily with breakfast. 90 tablet 3 04/26/2018 at 0900  . cyanocobalamin ([employer] VITAMIN B12) 2000 MCG tablet Take 1 tablet (2,000 mcg total) by mouth daily. 30 tablet 11 04/26/2018 at Unknown time  . docusate sodium (COLACE) 100 MG capsule Take 200 mg by mouth 2 (two) times daily.   04/26/2018 at Unknown time  . folic acid (FOLVITE) 1 MG tablet TAKE 1 TABLET BY MOUTH EVERY DAY 30 tablet 11 04/26/2018 at Unknown  time  . furosemide (LASIX) 20 MG tablet 04/09/18 dose decreased to 40 mg am and 20 mg pm 180 tablet 1 04/26/2018 at Unknown time  . gabapentin (NEURONTIN) 100 MG capsule TAKE 2 CAPSULES BY MOUTH 3  TIMES DAILY 540 capsule 1 04/26/2018 at Unknown time  . hydrocortisone (ANUSOL-HC) 25 MG suppository Place 1 suppository (25 mg total) rectally every 12 (twelve) hours. (Patient taking differently: Place 25 mg rectally 2 (two) times daily. ) 12 suppository 1 unknown  . mometasone (ELOCON) 0.1 % cream Apply 1 application topically daily. 45 g 0 unknown  . ondansetron (ZOFRAN) 4 MG tablet TAKE 1 TABLET BY MOUTH  EVERY 8 HOURS AS NEEDED FOR NAUSEA AND VOMITING 60 tablet 3 04/26/2018 at Unknown time  . pantoprazole (PROTONIX) 40 MG tablet Take 1 tablet (40 mg total) by mouth 2 (two) times daily. 180 tablet 3 04/26/2018 at Unknown time  . potassium chloride SA (K-DUR,KLOR-CON) 20 MEQ tablet TAKE 1 TABLET BY MOUTH  DAILY (Patient taking differently: One in the morning and one every other afternoon.) 90 tablet 3 04/26/2018 at Unknown time  . prednisoLONE acetate (PRED FORTE) 1 % ophthalmic suspension Place 1 drop into both eyes 4 (four) times daily.    04/25/2018 at Unknown time  . sodium chloride (MURO 128) 2 % ophthalmic solution Place 1 drop into both eyes 3 (three) times daily.    04/25/2018 at Unknown time  . traMADol-acetaminophen (ULTRACET) 37.5-325 MG tablet TAKE 1 TABLET BY MOUTH EVERY 4 HOURS AS NEEDED FOR PAIN 180 tablet 0 04/26/2018 at Unknown time    Assessment: 84 y.o.femalewith a past medical history significant for systolic heart failure, chronic kidney disease stage IV, chronic atrial fibrillation (not on anticoagulation due to prior GI bleed), hypertension, gastroesophageal reflux disease, and recent diagnosis of diverticulitis; who presented to the emergency department secondary to increased shortness of breath, lower extremity edema, orthopnea. Patient with recent labs from 9/10 indicating iron  deficient anemia and receiving weekly IV Feraheme.  Goal of Therapy:  Replace iron  Plan:  Feraheme 510mg  IV x 1  Isac Sarna, BS [place], [place] Clinical Pharmacist Pager [phone] 04/27/2018,7:45 PM

## 2018-04-27 NOTE — Evaluation (Signed)
Physical Therapy Evaluation Patient Details Name: [name] [name] MRN: [id number] DOB: 1923-03-27 Today's Date: 04/27/2018   History of Present Illness  [name] [name] is a 84 y.o. female with a past medical history significant for systolic heart failure, chronic kidney disease stage IV, chronic atrial fibrillation (not on anticoagulation due to prior GI bleed), hypertension, gastroesophageal reflux disease, and recent diagnosis of diverticulitis; who presented to the emergency department secondary to increased shortness of breath, lower extremity edema, orthopnea and per family members some confusion while attempting to use prescribed antibiotics for diverticulitis.  Patient was seen in the emergency department on 04/23/18 due to nausea and left lower quadrant pain; Diagnosis of acute diverticulitis was made and the patient discharged home on Flagyl and full dose ciprofloxacin.  Family reported by Saturday night patient was experiencing limits of confusion around using antibiotics and they stop given to her.  They also noticed increased swelling in her lower extremities and shortness of breath.  They follow-up with her outpatient PCP and during that visit she was not complaining of any nausea, vomiting or abdominal pain.  Mentation appears to be okay while off antibiotics.  PCP noticed increased rales SOB and LE edema. Patient sent to ED for further evaluation with concerns of CHF exacerbation.  Patient expressed difficulty laying down flat, shortness of breath at rest and with minimal activity and Worsening lower extremity edema.    Clinical Impression  Patient demonstrates slow labored movement for sitting up at bedside with c/o right hip discomfort, able to transfer to [facility] to have a BM prior to gait training, incontinent of urine when initially standing up from bedside, demonstrated slow labored cadence during ambulation in hallway, mostly limited due to fatigue and fatiguing of arms secondary to  leaning on RW.  Patient tolerated sitting up in chair after therapy with her daughter present in room.  Patient will benefit from continued physical therapy in hospital and recommended venue below to increase strength, balance, endurance for safe ADLs and gait.    Follow Up Recommendations SNF;Supervision/Assistance - 24 hour    Equipment Recommendations  None recommended by PT    Recommendations for Other Services       Precautions / Restrictions Precautions Precautions: Fall Restrictions Weight Bearing Restrictions: No      Mobility  Bed Mobility Overal bed mobility: Needs Assistance Bed Mobility: Supine to Sit     Supine to sit: Mod assist     General bed mobility comments: slow labored movement  Transfers Overall transfer level: Needs assistance Equipment used: Rolling walker (2 wheeled) Transfers: Sit to/from [facility] Sit to Stand: Min assist Stand pivot transfers: Min assist;Mod assist       General transfer comment: slow labored  Ambulation/Gait Ambulation/Gait assistance: Min assist Gait Distance (Feet): 35 Feet Assistive device: Rolling walker (2 wheeled) Gait Pattern/deviations: Decreased step length - right;Decreased step length - left;Decreased stride length Gait velocity: slow   General Gait Details: slow labored cadence without loss of balance, limited mostly due to c/o fatigue and BUE pain due to IV insertions  Stairs            Wheelchair Mobility    Modified Rankin (Stroke Patients Only)       Balance Overall balance assessment: Needs assistance Sitting-balance support: Feet supported;No upper extremity supported Sitting balance-Leahy Scale: Good     Standing balance support: During functional activity;Bilateral upper extremity supported Standing balance-Leahy Scale: Fair Standing balance comment: using RW  Pertinent Vitals/Pain Pain Assessment: 0-10 Pain Score: 5   Pain Location: right hip Pain Descriptors / Indicators: Aching;Discomfort Pain Intervention(s): Limited activity within patient's tolerance;Monitored during session    [place] expects to be discharged to:: Private residence Living Arrangements: Alone Available Help at Discharge: Family Type of Home: Apartment Home Access: Ramped entrance     Home Layout: One level Home Equipment: Environmental consultant - 4 wheels;Shower seat;Grab bars - tub/shower;Walker - 2 wheels      Prior Function Level of Independence: Needs assistance   Gait / Transfers Assistance Needed: household ambulator with Radiation protection practitioner, uses transport chair for community  ADL's / Homemaking Assistance Needed: assisted by family for household and community ADLs        Hand Dominance   Dominant Hand: Right    Extremity/Trunk Assessment   Upper Extremity Assessment Upper Extremity Assessment: Generalized weakness    Lower Extremity Assessment Lower Extremity Assessment: Generalized weakness    Cervical / Trunk Assessment Cervical / Trunk Assessment: Normal  Communication   Communication: HOH(has hearing aide left side)  Cognition Arousal/Alertness: Awake/alert Behavior During Therapy: WFL for tasks assessed/performed Overall Cognitive Status: Within Functional Limits for tasks assessed                                        General Comments      Exercises     Assessment/Plan    PT Assessment Patient needs continued PT services  PT Problem List Decreased strength;Decreased activity tolerance;Decreased balance;Decreased mobility       PT Treatment Interventions Gait training;Stair training;Functional mobility training;Therapeutic activities;Therapeutic exercise;Patient/family education    PT Goals (Current goals can be found in the Care Plan section)  Acute Rehab PT Goals Patient Stated Goal: return home PT Goal Formulation: With patient/family Time For Goal Achievement:  05/11/18 Potential to Achieve Goals: Good    Frequency Min 3X/week   Barriers to discharge        Co-evaluation               AM-PAC PT "6 Clicks" Daily Activity  Outcome Measure Difficulty turning over in bed (including adjusting bedclothes, sheets and blankets)?: A Little Difficulty moving from lying on back to sitting on the side of the bed? : A Lot Difficulty sitting down on and standing up from a chair with arms (e.g., wheelchair, bedside commode, etc,.)?: A Little Help needed moving to and from a bed to chair (including a wheelchair)?: A Little Help needed walking in hospital room?: A Little Help needed climbing 3-5 steps with a railing? : A Lot 6 Click Score: 16    End of Session Equipment Utilized During Treatment: Gait belt Activity Tolerance: Patient tolerated treatment well;Patient limited by fatigue Patient left: in chair;with call bell/phone within reach;with family/visitor present Nurse Communication: Mobility status PT Visit Diagnosis: Unsteadiness on feet (R26.81);Other abnormalities of gait and mobility (R26.89);Muscle weakness (generalized) (M62.81)    Time: 0174-9449 PT Time Calculation (min) (ACUTE ONLY): 37 min   Charges:   PT Evaluation $PT Eval Moderate Complexity: 1 Mod PT Treatments $Therapeutic Activity: 23-37 mins        2:15 PM, 04/27/18 Lonell Grandchild, MPT Physical Therapist with [facility] [phone] office [phone] mobile phone

## 2018-04-27 NOTE — Progress Notes (Signed)
*  PRELIMINARY RESULTS* Echocardiogram 2D Echocardiogram has been performed.  Leavy Cella 04/27/2018, 10:57 AM

## 2018-04-28 ENCOUNTER — Inpatient Hospital Stay (HOSPITAL_COMMUNITY): Payer: Medicare Other

## 2018-04-28 DIAGNOSIS — K5732 Diverticulitis of large intestine without perforation or abscess without bleeding: Secondary | ICD-10-CM

## 2018-04-28 DIAGNOSIS — I4510 Unspecified right bundle-branch block: Secondary | ICD-10-CM

## 2018-04-28 DIAGNOSIS — E876 Hypokalemia: Secondary | ICD-10-CM

## 2018-04-28 DIAGNOSIS — N184 Chronic kidney disease, stage 4 (severe): Secondary | ICD-10-CM

## 2018-04-28 DIAGNOSIS — G894 Chronic pain syndrome: Secondary | ICD-10-CM

## 2018-04-28 DIAGNOSIS — I482 Chronic atrial fibrillation: Secondary | ICD-10-CM

## 2018-04-28 DIAGNOSIS — I5023 Acute on chronic systolic (congestive) heart failure: Secondary | ICD-10-CM

## 2018-04-28 LAB — CBC
HCT: 35 % — ABNORMAL LOW (ref 36.0–46.0)
Hemoglobin: 10.5 g/dL — ABNORMAL LOW (ref 12.0–15.0)
MCH: 28 pg (ref 26.0–34.0)
MCHC: 30 g/dL (ref 30.0–36.0)
MCV: 93.3 fL (ref 78.0–100.0)
PLATELETS: 250 10*3/uL (ref 150–400)
RBC: 3.75 MIL/uL — AB (ref 3.87–5.11)
RDW: 14.3 % (ref 11.5–15.5)
WBC: 8.4 10*3/uL (ref 4.0–10.5)

## 2018-04-28 LAB — GLUCOSE, CAPILLARY: GLUCOSE-CAPILLARY: 104 mg/dL — AB (ref 70–99)

## 2018-04-28 LAB — COMPREHENSIVE METABOLIC PANEL
ALT: 14 U/L (ref 0–44)
AST: 27 U/L (ref 15–41)
Albumin: 3.2 g/dL — ABNORMAL LOW (ref 3.5–5.0)
Alkaline Phosphatase: 71 U/L (ref 38–126)
Anion gap: 11 (ref 5–15)
BILIRUBIN TOTAL: 1 mg/dL (ref 0.3–1.2)
BUN: 21 mg/dL (ref 8–23)
CHLORIDE: 99 mmol/L (ref 98–111)
CO2: 28 mmol/L (ref 22–32)
CREATININE: 1.43 mg/dL — AB (ref 0.44–1.00)
Calcium: 8.1 mg/dL — ABNORMAL LOW (ref 8.9–10.3)
GFR, EST AFRICAN AMERICAN: 35 mL/min — AB (ref >60)
GFR, EST NON AFRICAN AMERICAN: 31 mL/min — AB (ref >60)
Glucose, Bld: 110 mg/dL — ABNORMAL HIGH (ref 70–99)
Potassium: 3.5 mmol/L (ref 3.5–5.1)
Sodium: 138 mmol/L (ref 135–145)
Total Protein: 6.6 g/dL (ref 6.5–8.1)

## 2018-04-28 LAB — BRAIN NATRIURETIC PEPTIDE: B Natriuretic Peptide: 419 pg/mL — ABNORMAL HIGH (ref 0.0–100.0)

## 2018-04-28 LAB — IRON AND TIBC
Iron: 240 ug/dL — ABNORMAL HIGH (ref 28–170)
Saturation Ratios: 67 % — ABNORMAL HIGH (ref 10.4–31.8)
TIBC: 358 ug/dL (ref 250–450)
UIBC: 118 ug/dL

## 2018-04-28 LAB — OCCULT BLOOD X 1 CARD TO LAB, STOOL: Fecal Occult Bld: POSITIVE — AB

## 2018-04-28 LAB — BLOOD GAS, ARTERIAL
Acid-Base Excess: 3.9 mmol/L — ABNORMAL HIGH (ref 0.0–2.0)
BICARBONATE: 27.7 mmol/L (ref 20.0–28.0)
Drawn by: 277331
FIO2: 1
O2 SAT: 99.7 %
PATIENT TEMPERATURE: 37
PCO2 ART: 44.8 mmHg (ref 32.0–48.0)
PO2 ART: 238 mmHg — AB (ref 83.0–108.0)
pH, Arterial: 7.415 (ref 7.350–7.450)

## 2018-04-28 LAB — TROPONIN I
Troponin I: <0.03 ng/mL (ref <0.03)
Troponin I: 0.11 ng/mL (ref <0.03)

## 2018-04-28 LAB — BASIC METABOLIC PANEL
Anion gap: 8 (ref 5–15)
BUN: 24 mg/dL — AB (ref 8–23)
CALCIUM: 7.7 mg/dL — AB (ref 8.9–10.3)
CO2: 29 mmol/L (ref 22–32)
CREATININE: 1.51 mg/dL — AB (ref 0.44–1.00)
Chloride: 101 mmol/L (ref 98–111)
GFR calc Af Amer: 33 mL/min — ABNORMAL LOW (ref >60)
GFR, EST NON AFRICAN AMERICAN: 29 mL/min — AB (ref >60)
Glucose, Bld: 99 mg/dL (ref 70–99)
Potassium: 3.3 mmol/L — ABNORMAL LOW (ref 3.5–5.1)
SODIUM: 138 mmol/L (ref 135–145)

## 2018-04-28 LAB — FOLATE: FOLATE: 49.1 ng/mL (ref >5.9)

## 2018-04-28 LAB — VITAMIN B12: VITAMIN B 12: 3815 pg/mL — AB (ref 180–914)

## 2018-04-28 LAB — MRSA PCR SCREENING: MRSA by PCR: NEGATIVE

## 2018-04-28 LAB — PROCALCITONIN: PROCALCITONIN: 0.27 ng/mL

## 2018-04-28 LAB — FERRITIN: FERRITIN: 53 ng/mL (ref 11–307)

## 2018-04-28 MED ORDER — METHYLPREDNISOLONE SODIUM SUCC 125 MG IJ SOLR
125.0000 mg | Freq: Once | INTRAMUSCULAR | Status: AC
  Administered 2018-04-28: 125 mg via INTRAVENOUS
  Filled 2018-04-28: qty 2

## 2018-04-28 MED ORDER — POTASSIUM CHLORIDE 10 MEQ/100ML IV SOLN
10.0000 meq | INTRAVENOUS | Status: AC
  Administered 2018-04-28 (×3): 10 meq via INTRAVENOUS
  Filled 2018-04-28: qty 100

## 2018-04-28 MED ORDER — SODIUM CHLORIDE 0.9 % IV SOLN
500.0000 mg | Freq: Two times a day (BID) | INTRAVENOUS | Status: DC
  Administered 2018-04-29 – 2018-05-01 (×4): 500 mg via INTRAVENOUS
  Filled 2018-04-28 (×15): qty 0.5

## 2018-04-28 MED ORDER — IPRATROPIUM-ALBUTEROL 0.5-2.5 (3) MG/3ML IN SOLN
3.0000 mL | Freq: Four times a day (QID) | RESPIRATORY_TRACT | Status: DC
  Administered 2018-04-28 – 2018-04-30 (×7): 3 mL via RESPIRATORY_TRACT
  Filled 2018-04-28 (×10): qty 3

## 2018-04-28 MED ORDER — BUDESONIDE 0.5 MG/2ML IN SUSP
0.5000 mg | Freq: Two times a day (BID) | RESPIRATORY_TRACT | Status: DC
  Administered 2018-04-28 – 2018-05-01 (×5): 0.5 mg via RESPIRATORY_TRACT
  Filled 2018-04-28 (×6): qty 2

## 2018-04-28 MED ORDER — ALBUTEROL SULFATE (2.5 MG/3ML) 0.083% IN NEBU
INHALATION_SOLUTION | RESPIRATORY_TRACT | Status: AC
  Administered 2018-04-28: 5 mg via RESPIRATORY_TRACT
  Filled 2018-04-28: qty 6

## 2018-04-28 MED ORDER — METHYLPREDNISOLONE SODIUM SUCC 125 MG IJ SOLR
60.0000 mg | Freq: Every day | INTRAMUSCULAR | Status: DC
  Administered 2018-04-29: 60 mg via INTRAVENOUS
  Filled 2018-04-28: qty 2

## 2018-04-28 MED ORDER — IPRATROPIUM-ALBUTEROL 0.5-2.5 (3) MG/3ML IN SOLN
3.0000 mL | RESPIRATORY_TRACT | Status: DC | PRN
  Administered 2018-05-01: 3 mL via RESPIRATORY_TRACT

## 2018-04-28 MED ORDER — FUROSEMIDE 10 MG/ML IJ SOLN
40.0000 mg | Freq: Once | INTRAMUSCULAR | Status: AC
  Administered 2018-04-28: 40 mg via INTRAVENOUS

## 2018-04-28 MED ORDER — ALBUTEROL SULFATE (2.5 MG/3ML) 0.083% IN NEBU
5.0000 mg | INHALATION_SOLUTION | Freq: Once | RESPIRATORY_TRACT | Status: AC
  Administered 2018-04-28: 5 mg via RESPIRATORY_TRACT

## 2018-04-28 MED ORDER — FUROSEMIDE 10 MG/ML IJ SOLN
INTRAMUSCULAR | Status: AC
  Filled 2018-04-28: qty 4

## 2018-04-28 MED ORDER — ALUM & MAG HYDROXIDE-SIMETH 200-200-20 MG/5ML PO SUSP
30.0000 mL | Freq: Four times a day (QID) | ORAL | Status: DC | PRN
  Filled 2018-04-28: qty 30

## 2018-04-28 NOTE — Progress Notes (Signed)
Pt transported down to Stepdown unit for closer monitoring related to breathing issues. Pt transported to ICU room 07, report given to [facility], [place].

## 2018-04-28 NOTE — Progress Notes (Signed)
PROGRESS NOTE  [name] [name] FHL:[id number] DOB: 1922/12/20 DOA: 04/26/2018 PCP: [name], MD  Brief History:  84 year old female with a history of systolic CHF, CKD stage IV, chronic atrial fibrillation not on anticoagulation secondary to GI bleed, hypertension, GERD presenting from her primary care provider's office secondary to worsening lower extremity edema, shortness of breath, and orthopnea.  The patient visited the emergency department on 04/23/2018 with lower abdominal pain.  CT of the abdomen and pelvis at that time showed acute diverticulitis of the sigmoid colon.  The patient was started on ciprofloxacin 500 mg twice daily and metronidazole 500 mg every 8 hours.  On 04/24/2018, the patient became increasingly confused with hallucinations.  As result, the patient's family stopped her antibiotics.  She went to see her primary care provider on 04/26/2018.  She was noted to have labored respirations with oxygen saturation of 90% on room air.  As result, the patient was sent to the emergency department for further evaluation.  Work-up in the emergency department including chest x-ray showed left perihilar airspace disease with WBC 8.0.  BNP was 656.  The patient was started on intravenous furosemide secondary to concerns for fluid overload.  Assessment/Plan: Acute on chronic systolic CHF -3/73/4287 echo EF 60 to 65%, no WMA, moderate AI, mild left ear/TR, moderate TR, PASP 43 -Continue IV furosemide -The patient has gained 10 pounds over the last 5 to 6 months -Admission weight 170.2 pounds -03/31/2018 weight 168 pounds at cardiology office at which time the patient was felt to be fluid overloaded -Daily weights -Patient has had indiscretion with fluid intake -repeat CXR as pt continues to complain of dyspnea  Dysphagia -Order esophagram  Acute diverticulitis -Abdominal pain improved -Continue Cipro and Flagyl -04/23/2018 CT abdomen and pelvis acute diverticulitis at  the junction of the sigmoid and rectum.  There is some mild diverticular inflammatory changes in the more proximal sigmoid colon.  Acute respiratory failure with hypoxia -had oxygen saturation 89% on RA at time of admission -due to CHF -improving -now stable on RA  Chronic atrial fibrillation -Rate controlled -Continue carvedilol -Not on anticoagulation secondary to history of GI bleed -CHADSVASc = 5 -continue plavix -Personally reviewed EKG--atrial fibrillation, RBBB, unchanged  CKD stage IV -Baseline creatinine 1.3-1.7 -Monitor with diuresis  Essential hypertension -Continue carvedilol -Well-controlled  Coronary artery disease -No chest pain presently -03/18/2017 heart catheterization--mild nonobstructive disease  Chronic right hip pain -Continue Ultracet, gabapentin  Anemia of chronic disease -Check iron studies -Serum G81 -Folic acid -FOBT   Hypokalemia -replete -check mag    Disposition Plan:   Home in 1-2 days  Family Communication:   Daughter updated at bedside  Consultants:    Code Status:  FULL / DNR  DVT Prophylaxis:  [place] Heparin / [place] Lovenox   Procedures: As Listed in Progress Note Above  Antibiotics: None    Subjective: Patient states that her breathing is a little better but she has dyspnea with minor exertion.  She denies any nausea, vomiting, but complains of dysphagia and spitting up.  She denies any diarrhea, abdominal pain, hematochezia, melena.  There is no dysuria hematuria.  She denies any headache, neck pain.  Objective: Vitals:   04/27/18 2059 04/27/18 2257 04/28/18 0626 04/28/18 0832  BP:  (!) 103/53 (!) 96/43 (!) 102/39  Pulse:  76 69 78  Resp:  20 18 20   Temp:  99.2 F (37.3 C) 98 F (36.7 C)   TempSrc:  Oral Oral   SpO2: 93% 93% 92% 95%  Weight:      Height:        Intake/Output Summary (Last 24 hours) at 04/28/2018 1126 Last data filed at 04/28/2018 0909 Gross per 24 hour  Intake 366 ml  Output 1900 ml  Net  -1534 ml   Weight change: 0.122 kg Exam:   General:  Pt is alert, follows commands appropriately, not in acute distress  HEENT: No icterus, No thrush, No neck mass, [place]/AT  Cardiovascular: IRRR, S1/S2, no rubs, no gallops  Respiratory: Bilateral crackles.  No wheezing.  Good air movement.  Abdomen: Soft/+BS, non tender, non distended, no guarding  Extremities: 1+LE edema, No lymphangitis, No petechiae, No rashes, no synovitis   Data Reviewed: I have personally reviewed following labs and imaging studies Basic Metabolic Panel: Recent Labs  Lab 04/23/18 1106 04/26/18 1114 04/27/18 0547 04/28/18 0447  NA 138 138 138 138  K 3.8 3.6 3.7 3.3*  CL 101 102 102 101  CO2 30 27 26 29   GLUCOSE 97 103* 97 99  BUN 32* 32* 28* 24*  CREATININE 1.87* 1.97* 1.74* 1.51*  CALCIUM 8.1* 8.2* 7.6* 7.7*   Liver Function Tests: Recent Labs  Lab 04/23/18 1106 04/26/18 1114  AST 14* 24  ALT 9 12  ALKPHOS 61 64  BILITOT 0.9 0.8  PROT 6.7 6.7  ALBUMIN 3.1* 3.1*   Recent Labs  Lab 04/23/18 1106  LIPASE 24   No results for input(s): AMMONIA in the last 168 hours. Coagulation Profile: No results for input(s): INR, PROTIME in the last 168 hours. CBC: Recent Labs  Lab 04/23/18 1106 04/26/18 1114  WBC 8.0 6.9  NEUTROABS 5.9 5.5  HGB 8.9* 9.3*  HCT 29.7* 30.8*  MCV 94.6 95.1  PLT 172 217   Cardiac Enzymes: Recent Labs  Lab 04/26/18 1114 04/26/18 1817 04/27/18 0008 04/27/18 0548  TROPONINI <0.03 <0.03 <0.03 <0.03   BNP: Invalid input(s): POCBNP CBG: No results for input(s): GLUCAP in the last 168 hours. HbA1C: No results for input(s): HGBA1C in the last 72 hours. Urine analysis:    Component Value Date/Time   COLORURINE YELLOW 04/23/2018 [place] 04/23/2018 1032   LABSPEC 1.015 04/23/2018 1032   PHURINE 5.0 04/23/2018 1032   GLUCOSEU NEGATIVE 04/23/2018 1032   HGBUR SMALL (A) 04/23/2018 1032   BILIRUBINUR NEGATIVE 04/23/2018 1032   KETONESUR  NEGATIVE 04/23/2018 1032   PROTEINUR NEGATIVE 04/23/2018 1032   UROBILINOGEN 1.0 12/18/2014 1735   NITRITE NEGATIVE 04/23/2018 1032   LEUKOCYTESUR NEGATIVE 04/23/2018 1032   Sepsis Labs: @LABRCNTIP (procalcitonin:4,lacticidven:4) )No results found for this or any previous visit (from the past 240 hour(s)).   Scheduled Meds: . carvedilol  3.125 mg Oral BID WC  . ciprofloxacin  500 mg Oral Daily  . clopidogrel  75 mg Oral Q breakfast  . docusate sodium  100 mg Oral BID  . folic acid  1 mg Oral Daily  . furosemide  20 mg Intravenous Q12H  . gabapentin  200 mg Oral TID  . heparin injection (subcutaneous)  5,000 Units Subcutaneous Q8H  . lidocaine  1 patch Transdermal Q12H  . metroNIDAZOLE  500 mg Oral Q8H  . pantoprazole  40 mg Oral BID  . potassium chloride SA  10 mEq Oral Daily  . prednisoLONE acetate  1 drop Both Eyes QID  . sodium chloride  1 drop Both Eyes TID  . sodium chloride flush  3 mL Intravenous Q12H  . cyanocobalamin  2,000 mcg Oral Daily   Continuous Infusions: . sodium chloride    . potassium chloride      Procedures/Studies: Ct Abdomen Pelvis Wo Contrast  Result Date: 04/23/2018 CLINICAL DATA:  Abdominal distension EXAM: CT ABDOMEN AND PELVIS WITHOUT CONTRAST TECHNIQUE: Multidetector CT imaging of the abdomen and pelvis was performed following the standard protocol without IV contrast. COMPARISON:  None. FINDINGS: Lower chest: Mild scarring is noted in the bases bilaterally. Heart is enlarged. The cardiac blood pool is somewhat hypodense which may be related to underlying anemia. Hepatobiliary: No focal liver abnormality is seen. Status post cholecystectomy. No biliary dilatation. Pancreas: Unremarkable. No pancreatic ductal dilatation or surrounding inflammatory changes. Spleen: Normal in size without focal abnormality. Adrenals/Urinary Tract: Adrenal glands are within normal limits. Kidneys are well visualized bilaterally without renal calculi or obstructive changes.  Left renal cyst is noted. The bladder is decompressed. Stomach/Bowel: There are inflammatory changes at the junction of the sigmoid and rectum with associated diverticular change and wall thickening. This is consistent with acute diverticulitis. Scattered mild inflammatory change in the more proximal sigmoid colon is seen. The appendix has been surgically removed. No other obstructive or inflammatory changes are noted. No abscess or perforation is seen. Vascular/Lymphatic: Aortic atherosclerosis. No enlarged abdominal or pelvic lymph nodes. Reproductive: Uterus has been surgically removed. No adnexal mass is seen. Other: No abdominal wall hernia or abnormality. No abdominopelvic ascites. Musculoskeletal: Left hip replacement is noted. There is a chronic appearing right hip joint effusion. Degenerative changes of the lumbar spine are noted. No compression deformity is seen. IMPRESSION: Changes consistent with acute diverticulitis at the junction of the sigmoid and rectum with some more mild diverticular inflammatory change in the more proximal sigmoid colon. No abscess or perforation is noted at this time. Chronic right hip joint effusion. No other acute abnormality is noted. Electronically Signed   By: Inez Catalina M.D.   On: 04/23/2018 12:42   Dg Chest 2 View  Result Date: 04/26/2018 CLINICAL DATA:  Shortness of breath EXAM: CHEST - 2 VIEW COMPARISON:  None FINDINGS: The lungs are hyperinflated likely secondary to COPD. There is hazy left perihilar airspace disease which may reflect atelectasis versus pneumonia. There is no pleural effusion or pneumothorax. There is stable cardiomegaly. The osseous structures are unremarkable. IMPRESSION: Hazy left perihilar airspace disease which may reflect atelectasis versus pneumonia. Electronically Signed   By: Kathreen Devoid   On: 04/26/2018 11:47    Orson Eva, DO  [facility] Pager [phone]  If 7PM-7AM, please contact  night-coverage [email or web address] Password [email or web address] 04/28/2018, 11:26 AM   LOS: 2 days

## 2018-04-28 NOTE — Progress Notes (Signed)
Responded to nursing call:  dyspnea   Subjective: Pt c/o pain all over and dyspnea.  Oxygen sat 95% on 2L.  Denies n/v. Pt placed on NRB prior to my arrival.  Vitals:   04/28/18 1301 04/28/18 1310 04/28/18 1314 04/28/18 1317  BP: (!) 118/92  (!) 111/49   Pulse: (!) 103 79    Resp:      Temp:      TempSrc:      SpO2: 100% 100% 100% 100%  Weight:      Height:       CV--RRR Lung--bilateral exp wheeze, bilateral crackles; poor air movement Abd--soft+BS/NT   Assessment/Plan: Acute respiratory failure with hypoxia -multifactorial including CHF, bronchospasm, aspiration pnemonitis and anxiety -personally reviewed CXR--increased interstitial markings -personally reviewed EKG--afib, nonspecific ST changes -ABG--7.45/44/238/27 on NRB x 10 min -lasix 40 mg IV x 1 -solumedrol 125 mg x 1 -check CMP, CBC, BNP, PCT -move to stepdown for closer monitoring -sisters updated at bedside -approximately 10-15 min after above meds, pt is breathing easier--no longer having stridor, increase WOB, or distress  The patient is critically ill with multiple organ systems failure and requires high complexity decision making for assessment and support, frequent evaluation and titration of therapies, application of advanced monitoring technologies and extensive interpretation of multiple databases.  Critical care time - 35 mins.    DTat     Orson Eva, DO [facility]

## 2018-04-28 NOTE — NC FL2 (Signed)
[place] LEVEL OF CARE SCREENING TOOL     IDENTIFICATION  Patient Name: [name] [name] Birthdate: 1923/02/07 Sex: female Admission Date (Current Location): 04/26/2018  [place] and [place] Number:  [facility] [id number] [place] and Address:  [facility] [address], [place]      Provider Number: [id number]  Attending Physician Name and Address:  Orson Eva, MD  Relative Name and Phone Number:  Sherre Scarlet (dtr) [phone]    Current Level of Care: Hospital Recommended Level of Care: [place] Prior Approval Number:    Date Approved/Denied:   PASRR Number: [id number]  Discharge Plan: SNF    Current Diagnoses: Patient Active Problem List   Diagnosis Date Noted  . CHF NYHA class III, acute on chronic, systolic ([place]) 44/31/5400  . Chronic pain syndrome 04/26/2018  . Diverticulitis large intestine 04/26/2018  . Chronic renal failure, stage 4 (severe) ([place]) 04/26/2018  . Heart failure ([place]) 04/04/2017  . Coronary artery disease 04/02/2017  . Hypertension 04/02/2017  . Stress-induced cardiomyopathy 03/19/2017  . Non-ST elevation (NSTEMI) myocardial infarction ([place])   . Chest pain 03/16/2017  . Atrial fibrillation, chronic ([place]) 03/16/2017  . Elevated homocysteine ([place]) 01/26/2017  . Vitamin B12 deficiency 01/26/2017  . Dysphagia 05/20/2016  . Hypokalemia 05/19/2016  . Thrombocytopenia ([place]) 05/19/2016  . Acute respiratory failure with hypoxia ([place]) 05/18/2016  . Acute bronchitis 05/18/2016  . Bilateral lower extremity edema 05/18/2016  . Degenerative joint disease involving multiple joints 05/18/2016  . Pyuria 05/18/2016  . DNR (do not resuscitate) 05/18/2016  . Constipation 02/09/2015  . Occult GI bleeding 08/23/2013  . IDA (iron deficiency anemia) 06/07/2013  . Melanoma of skin ([place]) 03/14/2013  . DCIS (ductal carcinoma in situ) of breast 03/14/2013  . BPPV (benign paroxysmal positional vertigo)  02/10/2013  . RBBB 02/10/2013  . Ascending aortic aneurysm ([place]) 02/10/2013  . Aortic insufficiency 02/10/2013  . S/P total knee replacement 08/14/2011  . Helicobacter pylori gastritis 07/14/2011  . Anemia 02/04/2011  . Bladder infection   . BURSITIS, HIP 05/29/2009    Orientation RESPIRATION BLADDER Height & Weight     Self, Situation, Place  Normal Incontinent Weight: 170 lb 4.3 oz (77.2 kg) Height:  5\' 5"  (165.1 cm)  BEHAVIORAL SYMPTOMS/MOOD NEUROLOGICAL BOWEL NUTRITION STATUS      Continent Diet(see dc summary)  AMBULATORY STATUS COMMUNICATION OF NEEDS Skin   Extensive Assist Verbally Normal                       Personal Care Assistance Level of Assistance  Bathing, Feeding, Dressing Bathing Assistance: Limited assistance Feeding assistance: Independent Dressing Assistance: Limited assistance     Functional Limitations Info  Sight, Hearing, Speech Sight Info: Adequate Hearing Info: Impaired Speech Info: Adequate    SPECIAL CARE FACTORS FREQUENCY  PT (By licensed PT)     PT Frequency: 5 times week              Contractures Contractures Info: Not present    Additional Factors Info  Code Status, Allergies Code Status Info: DNR Allergies Info: Codeine, tape, Asa, Iron, Penicillins           Current Medications (04/28/2018):  This is the current hospital active medication list Current Facility-Administered Medications  Medication Dose Route Frequency Provider Last Rate Last Dose  . 0.9 %  sodium chloride infusion  250 mL Intravenous PRN Barton Dubois, MD      . acetaminophen (TYLENOL) tablet 650 mg  650 mg Oral Q6H PRN Barton Dubois, MD   650 mg at 04/27/18 2337  . alum & mag hydroxide-simeth (MAALOX/MYLANTA) 200-200-20 MG/5ML suspension 30 mL  30 mL Oral Q6H PRN Tat, David, MD      . carvedilol (COREG) tablet 3.125 mg  3.125 mg Oral BID WC Barton Dubois, MD   3.125 mg at 04/28/18 0820  . ciprofloxacin (CIPRO) tablet 500 mg  500 mg Oral Daily  Barton Dubois, MD   500 mg at 04/28/18 0820  . clopidogrel (PLAVIX) tablet 75 mg  75 mg Oral Q breakfast Barton Dubois, MD   75 mg at 04/28/18 0819  . docusate sodium (COLACE) capsule 100 mg  100 mg Oral BID Barton Dubois, MD   100 mg at 04/27/18 2203  . folic acid (FOLVITE) tablet 1 mg  1 mg Oral Daily Barton Dubois, MD   1 mg at 04/28/18 0820  . furosemide (LASIX) injection 20 mg  20 mg Intravenous Wynelle Bourgeois, MD   20 mg at 04/27/18 2334  . gabapentin (NEURONTIN) capsule 200 mg  200 mg Oral TID Barton Dubois, MD   200 mg at 04/28/18 0819  . heparin injection 5,000 Units  5,000 Units Subcutaneous Q8H Barton Dubois, MD   5,000 Units at 04/28/18 8413  . lidocaine (LIDODERM) 5 % 1 patch  1 patch Transdermal Q12H Barton Dubois, MD   1 patch at 04/28/18 [phone]  . metroNIDAZOLE (FLAGYL) tablet 500 mg  500 mg Oral Q8H Barton Dubois, MD   500 mg at 04/28/18 1027  . morphine 2 MG/ML injection 1 mg  1 mg Intravenous Q4H PRN Barton Dubois, MD   1 mg at 04/28/18 1027  . ondansetron (ZOFRAN) injection 4 mg  4 mg Intravenous Q6H PRN Barton Dubois, MD      . pantoprazole (PROTONIX) EC tablet 40 mg  40 mg Oral BID Barton Dubois, MD   40 mg at 04/28/18 0820  . polyethylene glycol (MIRALAX / GLYCOLAX) packet 17 g  17 g Oral Daily PRN Barton Dubois, MD      . potassium chloride (K-DUR,KLOR-CON) CR tablet 10 mEq  10 mEq Oral Daily Barton Dubois, MD   10 mEq at 04/28/18 0820  . prednisoLONE acetate (PRED FORTE) 1 % ophthalmic suspension 1 drop  1 drop Both Eyes QID Barton Dubois, MD   1 drop at 04/28/18 [phone]  . sodium chloride (MURO 128) 2 % ophthalmic solution 1 drop  1 drop Both Eyes TID Barton Dubois, MD   1 drop at 04/28/18 6440  . sodium chloride flush (NS) 0.9 % injection 3 mL  3 mL Intravenous Q12H Barton Dubois, MD   3 mL at 04/28/18 3474  . sodium chloride flush (NS) 0.9 % injection 3 mL  3 mL Intravenous PRN Barton Dubois, MD      . traMADol Veatrice Bourbon) tablet 50 mg  50 mg Oral Q8H PRN  Barton Dubois, MD   50 mg at 04/28/18 0820  . vitamin B-12 (CYANOCOBALAMIN) tablet 2,000 mcg  2,000 mcg Oral Daily Barton Dubois, MD   2,000 mcg at 04/28/18 2595     Discharge Medications: Please see discharge summary for a list of discharge medications.  Relevant Imaging Results:  Relevant Lab Results:   Additional Information SSN: [id number], LCSW

## 2018-04-28 NOTE — Progress Notes (Signed)
Physical Therapy Treatment Patient Details Name: [name] [name] MRN: [id number] DOB: Sep 20, 1922 Today's Date: 04/28/2018    History of Present Illness [name] [name] is a 84 y.o. female with a past medical history significant for systolic heart failure, chronic kidney disease stage IV, chronic atrial fibrillation (not on anticoagulation due to prior GI bleed), hypertension, gastroesophageal reflux disease, and recent diagnosis of diverticulitis; who presented to the emergency department secondary to increased shortness of breath, lower extremity edema, orthopnea and per family members some confusion while attempting to use prescribed antibiotics for diverticulitis.  Patient was seen in the emergency department on 04/23/18 due to nausea and left lower quadrant pain; Diagnosis of acute diverticulitis was made and the patient discharged home on Flagyl and full dose ciprofloxacin.  Family reported by Saturday night patient was experiencing limits of confusion around using antibiotics and they stop given to her.  They also noticed increased swelling in her lower extremities and shortness of breath.  They follow-up with her outpatient PCP and during that visit she was not complaining of any nausea, vomiting or abdominal pain.  Mentation appears to be okay while off antibiotics.  PCP noticed increased rales SOB and LE edema. Patient sent to ED for further evaluation with concerns of CHF exacerbation.  Patient expressed difficulty laying down flat, shortness of breath at rest and with minimal activity and Worsening lower extremity edema.    PT Comments    Patient c/o difficulty swallowing and possible indigestion - RN aware.  Patient able to complete a few exercises before having to transfer to [facility] in bathroom to have bowel movement and later tolerated ambulating in hallway demonstrating slow labored movement.  Patient tolerated staying up in chair after therapy and prefers to sit due increased bilateral hip  pain when lying.  Patient will benefit from continued physical therapy in hospital and recommended venue below to increase strength, balance, endurance for safe ADLs and gait.    Follow Up Recommendations  SNF;Supervision/Assistance - 24 hour     Equipment Recommendations  None recommended by PT    Recommendations for Other Services       Precautions / Restrictions Precautions Precautions: Fall Restrictions Weight Bearing Restrictions: No    Mobility  Bed Mobility               General bed mobility comments: Patient presents up in chair   Transfers Overall transfer level: Needs assistance Equipment used: Rolling walker (2 wheeled) Transfers: Sit to/from [facility] Sit to Stand: Min assist Stand pivot transfers: Min assist       General transfer comment: slow labored  Ambulation/Gait Ambulation/Gait assistance: Min assist Gait Distance (Feet): 35 Feet Assistive device: Rolling walker (2 wheeled) Gait Pattern/deviations: Decreased step length - right;Decreased step length - left;Decreased stride length Gait velocity: slow   General Gait Details: slow labored cadence with audible bone on bone contact in right hip, no loss of balance, limited mostly due to fatigue   Stairs             Wheelchair Mobility    Modified Rankin (Stroke Patients Only)       Balance Overall balance assessment: Needs assistance Sitting-balance support: Feet supported;No upper extremity supported Sitting balance-Leahy Scale: Good     Standing balance support: During functional activity;Bilateral upper extremity supported Standing balance-Leahy Scale: Fair Standing balance comment: using RW  Cognition Arousal/Alertness: Awake/alert Behavior During Therapy: WFL for tasks assessed/performed Overall Cognitive Status: Within Functional Limits for tasks assessed                                         Exercises General Exercises - Lower Extremity Long Arc Quad: AROM;Strengthening;Seated;Both;5 reps Toe Raises: Seated;Strengthening;AROM;Both;10 reps Heel Raises: Seated;AROM;Strengthening;Both;10 reps    General Comments        Pertinent Vitals/Pain Pain Assessment: Faces Faces Pain Scale: Hurts a little bit Pain Location: right hip Pain Descriptors / Indicators: Aching;Discomfort Pain Intervention(s): Limited activity within patient's tolerance;Monitored during session    Home Living                      Prior Function            PT Goals (current goals can now be found in the care plan section) Acute Rehab PT Goals Patient Stated Goal: return home PT Goal Formulation: With patient/family Time For Goal Achievement: 05/11/18 Potential to Achieve Goals: Good Progress towards PT goals: Progressing toward goals    Frequency    Min 3X/week      PT Plan Current plan remains appropriate    Co-evaluation              AM-PAC PT "6 Clicks" Daily Activity  Outcome Measure  Difficulty turning over in bed (including adjusting bedclothes, sheets and blankets)?: A Little Difficulty moving from lying on back to sitting on the side of the bed? : A Lot Difficulty sitting down on and standing up from a chair with arms (e.g., wheelchair, bedside commode, etc,.)?: A Little Help needed moving to and from a bed to chair (including a wheelchair)?: A Little Help needed walking in hospital room?: A Little Help needed climbing 3-5 steps with a railing? : A Lot 6 Click Score: 16    End of Session   Activity Tolerance: Patient tolerated treatment well;Patient limited by fatigue Patient left: in chair;with call bell/phone within reach;with family/visitor present Nurse Communication: Mobility status PT Visit Diagnosis: Unsteadiness on feet (R26.81);Other abnormalities of gait and mobility (R26.89);Muscle weakness (generalized) (M62.81)     Time: 7793-9030 PT Time  Calculation (min) (ACUTE ONLY): 26 min  Charges:  $Therapeutic Exercise: 8-22 mins $Therapeutic Activity: 8-22 mins                     2:56 PM, 04/28/18 Lonell Grandchild, MPT Physical Therapist with [facility] 336 [phone] office [phone] mobile phone

## 2018-04-28 NOTE — Progress Notes (Signed)
Attempted nurses bedside swallow study with sips of water. Pt immediately coughed upon taking a sip. C/o pain with swallowing. Notified Dr. Carles Collet and gave new order for swallow eval and also order for breathing tx to help with cough.

## 2018-04-28 NOTE — Progress Notes (Signed)
Pt C/O shortness of breath and difficulty breathing. Pt O2 saturation was 92% on room air. 2 liters applied for patient comfort and to increase saturations. 100% on 2 liters, patient states work of breathing feels easier. MD notified, will continue to monitor.

## 2018-04-28 NOTE — Care Management (Signed)
CM consult for CHF assessment. Pt from home, lives with family, has insurance with drug coverage and PCP. Pt on [facility] registry, has had 1 admission and 2 ED visits in past 6 months. PT has recommended SNF. CSW to be consulted for placement. CM will sign off, may be re-consulted if needs arise.

## 2018-04-29 ENCOUNTER — Inpatient Hospital Stay (HOSPITAL_COMMUNITY): Payer: Medicare Other

## 2018-04-29 ENCOUNTER — Ambulatory Visit (HOSPITAL_COMMUNITY): Payer: Medicare Other | Admitting: Internal Medicine

## 2018-04-29 DIAGNOSIS — J690 Pneumonitis due to inhalation of food and vomit: Secondary | ICD-10-CM

## 2018-04-29 DIAGNOSIS — K5792 Diverticulitis of intestine, part unspecified, without perforation or abscess without bleeding: Secondary | ICD-10-CM

## 2018-04-29 LAB — BASIC METABOLIC PANEL
ANION GAP: 12 (ref 5–15)
BUN: 25 mg/dL — ABNORMAL HIGH (ref 8–23)
CALCIUM: 7.8 mg/dL — AB (ref 8.9–10.3)
CO2: 27 mmol/L (ref 22–32)
Chloride: 99 mmol/L (ref 98–111)
Creatinine, Ser: 1.37 mg/dL — ABNORMAL HIGH (ref 0.44–1.00)
GFR calc Af Amer: 37 mL/min — ABNORMAL LOW (ref >60)
GFR calc non Af Amer: 32 mL/min — ABNORMAL LOW (ref >60)
GLUCOSE: 105 mg/dL — AB (ref 70–99)
Potassium: 3.7 mmol/L (ref 3.5–5.1)
Sodium: 138 mmol/L (ref 135–145)

## 2018-04-29 LAB — CBC
HCT: 31.9 % — ABNORMAL LOW (ref 36.0–46.0)
HEMOGLOBIN: 9.8 g/dL — AB (ref 12.0–15.0)
MCH: 28.2 pg (ref 26.0–34.0)
MCHC: 30.7 g/dL (ref 30.0–36.0)
MCV: 91.7 fL (ref 78.0–100.0)
Platelets: 239 10*3/uL (ref 150–400)
RBC: 3.48 MIL/uL — AB (ref 3.87–5.11)
RDW: 14.2 % (ref 11.5–15.5)
WBC: 5.2 10*3/uL (ref 4.0–10.5)

## 2018-04-29 LAB — PROCALCITONIN: Procalcitonin: 0.31 ng/mL

## 2018-04-29 LAB — MAGNESIUM: Magnesium: 2.2 mg/dL (ref 1.7–2.4)

## 2018-04-29 NOTE — Progress Notes (Signed)
PROGRESS NOTE  [name] [name]:[id number] DOB: 08-Sep-1922 DOA: 04/26/2018 PCP: [name], MD  Brief History:  84 year old female with a history of systolic CHF, CKD stage IV, chronic atrial fibrillation not on anticoagulation secondary to GI bleed, hypertension, GERD presenting from her primary care provider's office secondary to worsening lower extremity edema, shortness of breath, and orthopnea.  The patient visited the emergency department on 04/23/2018 with lower abdominal pain.  CT of the abdomen and pelvis at that time showed acute diverticulitis of the sigmoid colon.  The patient was started on ciprofloxacin 500 mg twice daily and metronidazole 500 mg every 8 hours.  On 04/24/2018, the patient became increasingly confused with hallucinations.  As result, the patient's family stopped her antibiotics.  She went to see her primary care provider on 04/26/2018.  She was noted to have labored respirations with oxygen saturation of 90% on room air.  As result, the patient was sent to the emergency department for further evaluation.  Work-up in the emergency department including chest x-ray showed left perihilar airspace disease with WBC 8.0.  BNP was 656.  The patient was started on intravenous furosemide secondary to concerns for fluid overload.  On 04/28/18, patient developed respiratory distress, and she was moved to stepdown unit.  Assessment/Plan: Acute on chronic systolic CHF -03/14/9146 echo EF 60 to 65%, no WMA, moderate AI, mild left ear/TR, moderate TR, PASP 43 -Continue IV furosemide -The patient has gained 10 pounds over the last 5 to 6 months -Admission weight 170.2 pounds -03/31/2018 weight 168 pounds at cardiology office at which time the patient was felt to be fluid overloaded -Daily weights--NEG 7.3 lbs since admission -Patient has had indiscretion with fluid intake  Dysphagia -Order esophagram to eval for obstructive process, not modified barium swallow -speech  therapy eval  Aspiration pneumonitis -04/28/18 personally reviewed cxr--increase interstitial markings, increased basilar opacities R>L  Acute diverticulitis -Abdominal pain improved -Cipro and Flagyl changed to merrem -04/23/2018 CT abdomen and pelvis acute diverticulitis at the junction of the sigmoid and rectum.  There is some mild diverticular inflammatory changes in the more proximal sigmoid colon.  Acute respiratory failure with hypoxia -had oxygen saturation 89% on RA at time of admission -due to CHF and aspiration of pneumonia -suspect underlying undiagnosed COPD with hx of tobacco use -improving -now stable on 3L -start duonebs -continue IV solumedrol  Chronic atrial fibrillation -Rate controlled -Continue carvedilol -Not on anticoagulation secondary to history of GI bleed -CHADSVASc = 5 -continue plavix -Personally reviewed EKG--atrial fibrillation, RBBB, unchanged  CKD stage IV -Baseline creatinine 1.3-1.7 -Monitor with diuresis  Essential hypertension -Continue carvedilol -Well-controlled  Coronary artery disease -No chest pain presently -03/18/2017 heart catheterization--mild nonobstructive disease  Chronic right hip pain -Continue Ultracet, gabapentin  Anemia of chronic disease -Check iron studies -Serum W29--5621 -Folic HYQM--57.8 -FOBT positive--will need outpt GI follow up; may be related to diverticulitis  Hypokalemia -repleted -check mag--2.2    Disposition Plan:   SNF in 1-2 days  Family Communication:   Sister updated at bedside 9/27  Consultants:  none  Code Status:   DNR  DVT Prophylaxis:  [place] Heparin   Procedures: As Listed in Progress Note Above  Antibiotics: merrem 9/25>>> Cipro/flagyl 9/23>>>9/25    Subjective: Pt is breathing better, but still dyspneic with exertion.  Complains of nonproductive cough.  Denies cp, n/v/d, abd pain.  Objective: Vitals:   04/29/18 0600 04/29/18 0733 04/29/18 0751  04/29/18 4696  BP: (!) 98/55     Pulse: 75 79    Resp: 16 17    Temp:  97.9 F (36.6 C)    TempSrc:  Axillary    SpO2: (!) 86% 92% 96% 97%  Weight:      Height:        Intake/Output Summary (Last 24 hours) at 04/29/2018 0944 Last data filed at 04/29/2018 0500 Gross per 24 hour  Intake 257.01 ml  Output 400 ml  Net -142.99 ml   Weight change: -3.334 kg Exam:   General:  Pt is alert, follows commands appropriately, not in acute distress  HEENT: No icterus, No thrush, No neck mass, [place]/AT  Cardiovascular: RRR, S1/S2, no rubs, no gallops  Respiratory: bibasilar rales, R>L  Abdomen: Soft/+BS, non tender, non distended, no guarding  Extremities: 1 + LE edema, No lymphangitis, No petechiae, No rashes, no synovitis   Data Reviewed: I have personally reviewed following labs and imaging studies Basic Metabolic Panel: Recent Labs  Lab 04/26/18 1114 04/27/18 0547 04/28/18 0447 04/28/18 1341 04/29/18 0424  NA 138 138 138 138 138  K 3.6 3.7 3.3* 3.5 3.7  CL 102 102 101 99 99  CO2 27 26 29 28 27   GLUCOSE 103* 97 99 110* 105*  BUN 32* 28* 24* 21 25*  CREATININE 1.97* 1.74* 1.51* 1.43* 1.37*  CALCIUM 8.2* 7.6* 7.7* 8.1* 7.8*  MG  --   --   --   --  2.2   Liver Function Tests: Recent Labs  Lab 04/23/18 1106 04/26/18 1114 04/28/18 1341  AST 14* 24 27  ALT 9 12 14   ALKPHOS 61 64 71  BILITOT 0.9 0.8 1.0  PROT 6.7 6.7 6.6  ALBUMIN 3.1* 3.1* 3.2*   Recent Labs  Lab 04/23/18 1106  LIPASE 24   No results for input(s): AMMONIA in the last 168 hours. Coagulation Profile: No results for input(s): INR, PROTIME in the last 168 hours. CBC: Recent Labs  Lab 04/23/18 1106 04/26/18 1114 04/28/18 1341 04/29/18 0424  WBC 8.0 6.9 8.4 5.2  NEUTROABS 5.9 5.5  --   --   HGB 8.9* 9.3* 10.5* 9.8*  HCT 29.7* 30.8* 35.0* 31.9*  MCV 94.6 95.1 93.3 91.7  PLT 172 217 250 239   Cardiac Enzymes: Recent Labs  Lab 04/26/18 1817 04/27/18 0008 04/27/18 0548 04/28/18 1341  04/28/18 1857  TROPONINI <0.03 <0.03 <0.03 <0.03 0.11*   BNP: Invalid input(s): POCBNP CBG: Recent Labs  Lab 04/28/18 1303  GLUCAP 104*   HbA1C: No results for input(s): HGBA1C in the last 72 hours. Urine analysis:    Component Value Date/Time   COLORURINE YELLOW 04/23/2018 [place] 04/23/2018 1032   LABSPEC 1.015 04/23/2018 1032   PHURINE 5.0 04/23/2018 1032   GLUCOSEU NEGATIVE 04/23/2018 1032   HGBUR SMALL (A) 04/23/2018 1032   BILIRUBINUR NEGATIVE 04/23/2018 1032   KETONESUR NEGATIVE 04/23/2018 1032   PROTEINUR NEGATIVE 04/23/2018 1032   UROBILINOGEN 1.0 12/18/2014 1735   NITRITE NEGATIVE 04/23/2018 1032   LEUKOCYTESUR NEGATIVE 04/23/2018 1032   Sepsis Labs: @LABRCNTIP (procalcitonin:4,lacticidven:4) ) Recent Results (from the past 240 hour(s))  MRSA PCR Screening     Status: None   Collection Time: 04/28/18  2:42 PM  Result Value Ref Range Status   MRSA by PCR NEGATIVE NEGATIVE Final    Comment:        The GeneXpert MRSA Assay (FDA approved for NASAL specimens only), is one component of a comprehensive MRSA colonization surveillance program. It  is not intended to diagnose MRSA infection nor to guide or monitor treatment for MRSA infections. Performed at [facility], [address], [place], [place] [address]      Scheduled Meds: . budesonide (PULMICORT) nebulizer solution  0.5 mg Nebulization BID  . carvedilol  3.125 mg Oral BID WC  . clopidogrel  75 mg Oral Q breakfast  . docusate sodium  100 mg Oral BID  . folic acid  1 mg Oral Daily  . furosemide  20 mg Intravenous Q12H  . gabapentin  200 mg Oral TID  . heparin injection (subcutaneous)  5,000 Units Subcutaneous Q8H  . ipratropium-albuterol  3 mL Nebulization Q6H  . lidocaine  1 patch Transdermal Q12H  . methylPREDNISolone (SOLU-MEDROL) injection  60 mg Intravenous Daily  . pantoprazole  40 mg Oral BID  . potassium chloride SA  10 mEq Oral Daily  . prednisoLONE acetate  1 drop  Both Eyes QID  . sodium chloride  1 drop Both Eyes TID  . sodium chloride flush  3 mL Intravenous Q12H  . cyanocobalamin  2,000 mcg Oral Daily   Continuous Infusions: . sodium chloride    . meropenem (MERREM) IV      Procedures/Studies: Ct Abdomen Pelvis Wo Contrast  Result Date: 04/23/2018 CLINICAL DATA:  Abdominal distension EXAM: CT ABDOMEN AND PELVIS WITHOUT CONTRAST TECHNIQUE: Multidetector CT imaging of the abdomen and pelvis was performed following the standard protocol without IV contrast. COMPARISON:  None. FINDINGS: Lower chest: Mild scarring is noted in the bases bilaterally. Heart is enlarged. The cardiac blood pool is somewhat hypodense which may be related to underlying anemia. Hepatobiliary: No focal liver abnormality is seen. Status post cholecystectomy. No biliary dilatation. Pancreas: Unremarkable. No pancreatic ductal dilatation or surrounding inflammatory changes. Spleen: Normal in size without focal abnormality. Adrenals/Urinary Tract: Adrenal glands are within normal limits. Kidneys are well visualized bilaterally without renal calculi or obstructive changes. Left renal cyst is noted. The bladder is decompressed. Stomach/Bowel: There are inflammatory changes at the junction of the sigmoid and rectum with associated diverticular change and wall thickening. This is consistent with acute diverticulitis. Scattered mild inflammatory change in the more proximal sigmoid colon is seen. The appendix has been surgically removed. No other obstructive or inflammatory changes are noted. No abscess or perforation is seen. Vascular/Lymphatic: Aortic atherosclerosis. No enlarged abdominal or pelvic lymph nodes. Reproductive: Uterus has been surgically removed. No adnexal mass is seen. Other: No abdominal wall hernia or abnormality. No abdominopelvic ascites. Musculoskeletal: Left hip replacement is noted. There is a chronic appearing right hip joint effusion. Degenerative changes of the lumbar  spine are noted. No compression deformity is seen. IMPRESSION: Changes consistent with acute diverticulitis at the junction of the sigmoid and rectum with some more mild diverticular inflammatory change in the more proximal sigmoid colon. No abscess or perforation is noted at this time. Chronic right hip joint effusion. No other acute abnormality is noted. Electronically Signed   By: Inez Catalina M.D.   On: 04/23/2018 12:42   Dg Chest 2 View  Result Date: 04/26/2018 CLINICAL DATA:  Shortness of breath EXAM: CHEST - 2 VIEW COMPARISON:  None FINDINGS: The lungs are hyperinflated likely secondary to COPD. There is hazy left perihilar airspace disease which may reflect atelectasis versus pneumonia. There is no pleural effusion or pneumothorax. There is stable cardiomegaly. The osseous structures are unremarkable. IMPRESSION: Hazy left perihilar airspace disease which may reflect atelectasis versus pneumonia. Electronically Signed   By: Kathreen Devoid  On: 04/26/2018 11:47   US Venous Img Lower Bilateral  Result Date: 04/28/2018 CLINICAL DATA:  Pain and edema EXAM: BILATERAL LOWER EXTREMITY VENOUS DOPPLER ULTRASOUND TECHNIQUE: Gray-scale sonography with compression, as well as color and duplex ultrasound, were performed to evaluate the deep venous system from the level of the common femoral vein through the popliteal and proximal calf veins. COMPARISON:  None FINDINGS: Normal compressibility of the common femoral, superficial femoral, and popliteal veins, as well as the proximal calf veins. No filling defects to suggest DVT on grayscale or color Doppler imaging. Doppler waveforms show normal direction of venous flow, normal respiratory phasicity and response to augmentation. 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 The left great saphenous vein is dilated distally, compressible. IMPRESSION: No evidence of  lower extremity deep vein thrombosis. Electronically Signed   By: Lucrezia Europe M.D.   On: 04/28/2018  14:33   Dg Chest Port 1 View  Result Date: 04/28/2018 CLINICAL DATA:  Respiratory distress EXAM: PORTABLE CHEST 1 VIEW COMPARISON:  April 26, 2018 FINDINGS: The mediastinal contour stable heart size is enlarged. The aorta is tortuous. There are increased pulmonary interstitium bilaterally. There is no focal pneumonia or pleural effusion. The visualized skeletal structures are stable. IMPRESSION: Mild congestive heart failure. Electronically Signed   By: Abelardo Diesel M.D.   On: 04/28/2018 13:20    Orson Eva, DO  [facility] Pager [phone]  If 7PM-7AM, please contact night-coverage [email or web address] Password [email or web address] 04/29/2018, 9:44 AM   LOS: 3 days

## 2018-04-29 NOTE — Progress Notes (Signed)
SLP Cancellation Note  Patient Details Name: [name] [name] MRN: [id number] DOB: 07/17/1923   Cancelled treatment:       Reason Eval/Treat Not Completed: Patient at procedure or test/unavailable; Pt off floor for Barium swallow. SLP will check back later as schedule permits.  Thank you,  Genene Churn, [place]    Nara Visa 04/29/2018, 2:12 PM

## 2018-04-29 NOTE — Clinical Social Work Note (Addendum)
Late entry for 04/28/18  Clinical Social Work Assessment  Patient Details  Name: [name] [name] MRN: [id number] Date of Birth: July 07, 1923  Date of referral:  04/28/18               Reason for consult:  Facility Placement                Permission sought to share information with:    Permission granted to share information::     Name::        Agency::     Relationship::     Contact Information:     Housing/Transportation Living arrangements for the past 2 months:  Single Family Home Source of Information:  Adult Children Patient Interpreter Needed:  None Criminal Activity/Legal Involvement Pertinent to Current Situation/Hospitalization:  Yes Significant Relationships:  None Lives with:  Self Do you feel safe going back to the place where you live?  No Need for family participation in patient care:  No (Coment)  Care giving concerns: Daughters clean for patient, prepare her medications and provide meals. Patient also receives Meals on Wheels.    Social Worker assessment / plan:  At baseline, patient live alone, daughters check on her frequently. Patient ambulates with a rollator. Family is agreeable to SNF. History was provided by daughters, Sherre Scarlet and Lenox Ponds.   Employment status:  Retired Forensic scientist:  Medicare PT Recommendations:  [place] / Referral to community resources:  [place]  Patient/Family's Response to care:  Patient's family is agreeable to short term rehab at [facility].   Patient/Family's Understanding of and Emotional Response to Diagnosis, Current Treatment, and Prognosis:  Patient and family understand patient's diagnosis, treatment and prognosis.   Emotional Assessment Appearance:  Appears stated age Attitude/Demeanor/Rapport:    Affect (typically observed):  Calm Orientation:  Oriented to Self, Oriented to Place, Oriented to  Time, Oriented to Situation Alcohol / Substance use:  Not  Applicable Psych involvement (Current and /or in the community):  No (Comment)  Discharge Needs  Concerns to be addressed:  Discharge Planning Concerns Readmission within the last 30 days:  No Current discharge risk:  None Barriers to Discharge:  No Barriers Identified   Ihor Gully, LCSW 04/29/2018, 12:14 PM

## 2018-04-29 NOTE — Progress Notes (Addendum)
CRITICAL VALUE ALERT  Critical Value:  Troponin 0.11  Date & Time Notied:  04/29/18 @ 0010  Provider Notified: Lamar Blinks, NP  Orders Received/Actions taken: Waiting for call back/orders

## 2018-04-29 NOTE — Progress Notes (Signed)
SLP Cancellation Note  Patient Details Name: [name] [name] MRN: [id number] DOB: 1922/08/14   Cancelled treatment:       Reason Eval/Treat Not Completed: Other (comment)(Pt sleeping soundly); Results from barium swallow reviewed which show significant esophageal dysmotility. Pt now sleeping soundly, will defer BSE until tomorrow. See results from MBSS in 2017 below. RN reports Pt's symptoms which may be attributed to esophageal phase dysphagia.    Pt had MBSS in 2017 which showed: Oropharyngeal swallow essentially WNL with the exception of slight premature spillage with straw sips and when swallowing thin barium with barium tablet resulting in underepiglottic coating without deep penetration into laryngeal vestibule and mild retention of thin liquid residuals noted in pyriforms to which pt did not appear sensate and required verbal cue to "swallow again". The trace pooling only occurred with sequential straw sips and not with cup sips. Cricopharyngeus appeared prominent and esophageal sweep revealed retention of food/barium in mid and distal esophagus with air filled esophagus in proximal portion. Pt did experience some retrograde movement of retained barium/solids which is consistent with recent barium swallow (esophageal emptying was essentially gravity dependent) a couple weeks ago. Barium tablet appeared to traverse through esophagus in timely fashion before stasis noted in what appeared to be a hiatal hernia (could not be confirmed without presence of radiologist).  Pt subjectively reported globus sensation and shortness of breath and pointed to pharynx, however suspect this was referred symptoms from esophagus (air filled and retention of solids). Recommend self regulated regular textures and thin liquids and avoid use of straws. Pt encouraged to swallow 2x with each sip of liquid and implement reflux precautions (sit upright for all eating/drinking and remain upright 30-60 minutes following,  consume frequent, smaller meals, and avoid perceived troublesome foods (breads and meats unless finely masticated and taken with liquids).   Thank you,  Genene Churn, [place]    [place] 04/29/2018, 7:23 PM

## 2018-04-30 DIAGNOSIS — K5792 Diverticulitis of intestine, part unspecified, without perforation or abscess without bleeding: Secondary | ICD-10-CM

## 2018-04-30 DIAGNOSIS — D509 Iron deficiency anemia, unspecified: Secondary | ICD-10-CM

## 2018-04-30 DIAGNOSIS — R1312 Dysphagia, oropharyngeal phase: Secondary | ICD-10-CM

## 2018-04-30 DIAGNOSIS — I5023 Acute on chronic systolic (congestive) heart failure: Secondary | ICD-10-CM

## 2018-04-30 DIAGNOSIS — J9601 Acute respiratory failure with hypoxia: Secondary | ICD-10-CM

## 2018-04-30 DIAGNOSIS — R0600 Dyspnea, unspecified: Secondary | ICD-10-CM

## 2018-04-30 LAB — BASIC METABOLIC PANEL
ANION GAP: 11 (ref 5–15)
BUN: 31 mg/dL — AB (ref 8–23)
CHLORIDE: 101 mmol/L (ref 98–111)
CO2: 28 mmol/L (ref 22–32)
Calcium: 7.8 mg/dL — ABNORMAL LOW (ref 8.9–10.3)
Creatinine, Ser: 1.17 mg/dL — ABNORMAL HIGH (ref 0.44–1.00)
GFR calc Af Amer: 45 mL/min — ABNORMAL LOW (ref >60)
GFR, EST NON AFRICAN AMERICAN: 39 mL/min — AB (ref >60)
GLUCOSE: 101 mg/dL — AB (ref 70–99)
POTASSIUM: 3.2 mmol/L — AB (ref 3.5–5.1)
Sodium: 140 mmol/L (ref 135–145)

## 2018-04-30 LAB — CBC
HEMATOCRIT: 31.1 % — AB (ref 36.0–46.0)
HEMOGLOBIN: 9.6 g/dL — AB (ref 12.0–15.0)
MCH: 28.1 pg (ref 26.0–34.0)
MCHC: 30.9 g/dL (ref 30.0–36.0)
MCV: 90.9 fL (ref 78.0–100.0)
Platelets: 252 10*3/uL (ref 150–400)
RBC: 3.42 MIL/uL — AB (ref 3.87–5.11)
RDW: 14.3 % (ref 11.5–15.5)
WBC: 6 10*3/uL (ref 4.0–10.5)

## 2018-04-30 LAB — PROCALCITONIN: Procalcitonin: 0.27 ng/mL

## 2018-04-30 MED ORDER — PREDNISONE 20 MG PO TABS
60.0000 mg | ORAL_TABLET | Freq: Every day | ORAL | Status: DC
  Administered 2018-04-30 – 2018-05-01 (×2): 60 mg via ORAL
  Filled 2018-04-30 (×2): qty 3

## 2018-04-30 MED ORDER — FUROSEMIDE 40 MG PO TABS
40.0000 mg | ORAL_TABLET | Freq: Two times a day (BID) | ORAL | Status: DC
  Administered 2018-05-01: 40 mg via ORAL
  Filled 2018-04-30: qty 1

## 2018-04-30 NOTE — Clinical Social Work Note (Signed)
LCSW following. Per MD, pt will likely be stable for dc to [facility] tomorrow. Met with pt and her daughter to update. They remain agreeable to this plan. Updated Kerri at [facility]. Weekend CSW will be available if needed for pt's dc.

## 2018-04-30 NOTE — Consult Note (Signed)
Referring Provider: No ref. provider found Primary Care Physician:  [name], MD Primary Gastroenterologist:  Dr. Gala Romney  Date of Admission: 04/26/18 Date of Consultation: 04/30/18  Reason for Consultation:  Dysphagia  HPI:  [name] [name] is a 84 y.o. female with a past medical history of aortic insufficiency, CHF, chronic atrial fibrillation, chronic kidney disease, dehydration, iron deficiency anemia, nonischemic cardiomyopathy, peptic ulcer disease secondary to H. pylori status post treatment.  She initially presented to the emergency department for worsening lower extremity edema, shortness of breath and orthopnea.  CT of the abdomen and pelvis 04/23/2018 for lower abdominal pain found acute diverticulitis of the sigmoid colon and she was started on Cipro and Flagyl.  The patient had increasing confusion and hallucinations a day after antibiotics her family stopped and and followed up with primary care on 04/26/2018.  Desaturation and labored respirations resulted in referral to the emergency department.  Her white blood cell count was 8.0, BNP was 656.  She was started on Lasix for fluid overload.  She developed respiratory distress on 04/28/2018 and was moved to the stepdown/ICU.  During her hospitalization she developed dysphasia.  Barium pill esophagram found significant age-related esophageal dysmotility and no identified esophageal mass or stricture.  CBC showed stable hemoglobin.  Family remained concerned about dysphasia and request a GI consult.  Noted previous EGD with Maloney dilation 06/08/2013 which found small hiatal hernia, otherwise normal EGD status post Maloney dilation.  Recommend continue PPI due to ongoing Plavix. She was eventually restarted on Protonix.  Today she is in bed with her daughter at bedside.  She states she is feeling better.  She has had some swallowing concerns but these seem to be improving.  She is eating a soft diet and had pancakes and Milk this  morning with no dysphasia, tolerated diet well.  Past Medical History:  Diagnosis Date  . Aortic insufficiency   . Ascending aortic aneurysm ([place])    a. 4.6cm by CT 2018 - pt deferred any further evaluation/invasive management therefore follow-up deferred.  . Bladder infection    h/o  . CHF (congestive heart failure) ([place])   . Chronic atrial fibrillation ([facility])    a. not on anticoagulation due to prior GIB  . CKD (chronic kidney disease), stage III ([place])   . Coronary artery disease    a. mild nonobstructive by cath 2018.  Marland Kitchen DCIS (ductal carcinoma in situ) of breast 03/14/2013   right breast  . Dehydration    HISTORY   . Fall at home 09/10/12  . Hemorrhoids   . Hip fracture ([place])    hip surgery 2001  . History of blood clots    in leg  . History of knee surgery   . Hypertension   . Iron deficiency anemia, unspecified 03/14/2013   secondary to GI blood loss  . Kidney infection    h/o  . Melanoma of skin ([place]) 03/14/2013  . Melanoma of thigh ([place])    left  . Mini stroke ([place])   . Mitral regurgitation   . Nonischemic cardiomyopathy ([place])    a. EF 35-40% by echo in 03/2017 with cath showing nonobstructive CAD, suspected stress-induced.  . S/P colonoscopy March 2010   RMR: friable anal canal hemorrhoids, hyperplastic ascending polyp, adenomatous descending polyp   . Stomach ulcer    secondary to h.pylori, s/p treatment  . Thyroid condition   . Tricuspid regurgitation   . Venous stasis    edema    Past Surgical  History:  Procedure Laterality Date  . APPENDECTOMY  1942  . BREAST LUMPECTOMY  1998  . CARDIAC CATHETERIZATION    . CATARACT EXTRACTION, BILATERAL    . CHOLECYSTECTOMY    . COLONOSCOPY  06/05/11   pancolonic diverticulosis/ileal reosion/abnormal anorectal junction s/p biopsy: path for small intestine and Ti was benign with non-villous atrophy, rectal biopsy with prominent prolapse changes, no acute inflammation  . CORNEAL TRANSPLANT Bilateral 2013  . ENTEROSCOPY  N/A 06/13/2013   OVF:IEPP Gastritis/GI BLEED MOST LIKELY DUE TO DUODENAL ULCERS, AND ? AVMs  . ESOPHAGOGASTRODUODENOSCOPY  06/05/11   small hiatal hernia; + H.PYLORI GASTRITIS, s/p 5 days of Pylera, unable to finish due to N/V  . ESOPHAGOGASTRODUODENOSCOPY N/A 06/13/2013   Dr. Oneida Alar- see enteroscopy  . ESOPHAGOGASTRODUODENOSCOPY (EGD) WITH ESOPHAGEAL DILATION N/A 06/08/2013   IRJ:JOACZ hiatal hernia;  otherwise normal EGD s/p dilation  . GIVENS CAPSULE STUDY N/A 06/08/2013   Procedure: GIVENS CAPSULE STUDY;  Surgeon: Daneil Dolin, MD;  Location: [facility];  Service: Endoscopy;  Laterality: N/A;  . HEMORRHOID BANDING    . KNEE SURGERY Right 05/2006   total right  . LEFT HEART CATH AND CORONARY ANGIOGRAPHY N/A 03/18/2017   Procedure: LEFT HEART CATH AND CORONARY ANGIOGRAPHY;  Surgeon: Wellington Hampshire, MD;  Location: [facility];  Service: Cardiovascular;  Laterality: N/A;  . LEG SURGERY    . MELANOMA EXCISION Left 08/2006   left leg excision  . NM MYOCAR PERF WALL MOTION  03/27/2010   dipyridamole; small reversible basal to mid-septal defect (?artifact), post-stress EF 55%, low risk scan   . PARTIAL HYSTERECTOMY  1976  . TONSILLECTOMY    . TOTAL HIP ARTHROPLASTY Left 2001&2004    X 2 FOR LEFT HIP  . TRANSTHORACIC ECHOCARDIOGRAM  10/06/2012   EF 55-60%, mod eccentric hypertrophy, grade 2 diastolic dysfunction; mildly calcifed AV annulus with moderate regurg; aortic root mildly dilated; LA severely dailted; PA peak pressure 72mHg  . VARICOSE VEIN SURGERY      Prior to Admission medications   Medication Sig Start Date End Date Taking? Authorizing Provider  carvedilol (COREG) 3.125 MG tablet TAKE 1 TABLET BY MOUTH TWO  TIMES DAILY WITH MEALS 01/25/18  Yes [name], MD  clopidogrel (PLAVIX) 75 MG tablet Take 1 tablet (75 mg total) by mouth daily with breakfast. 11/05/17  Yes [name], MD  cyanocobalamin ([employer] VITAMIN B12) 2000 MCG tablet Take 1 tablet (2,000 mcg total)  by mouth daily. 04/06/18  Yes [name], MD  docusate sodium (COLACE) 100 MG capsule Take 200 mg by mouth 2 (two) times daily.   Yes [provider]  folic acid (FOLVITE) 1 MG tablet TAKE 1 TABLET BY MOUTH EVERY DAY 11/17/17  Yes Holley Bouche, NP  furosemide (LASIX) 20 MG tablet 04/09/18 dose decreased to 40 mg am and 20 mg pm 04/09/18  Yes Branch, Alphonse Guild, MD  gabapentin (NEURONTIN) 100 MG capsule TAKE 2 CAPSULES BY MOUTH 3  TIMES DAILY 02/03/18  Yes Carole Civil, MD  hydrocortisone (ANUSOL-HC) 25 MG suppository Place 1 suppository (25 mg total) rectally every 12 (twelve) hours. Patient taking differently: Place 25 mg rectally 2 (two) times daily.  05/16/15  Yes Annitta Needs, NP  mometasone (ELOCON) 0.1 % cream Apply 1 application topically daily. 08/11/17  Yes [name], MD  ondansetron (ZOFRAN) 4 MG tablet TAKE 1 TABLET BY MOUTH  EVERY 8 HOURS AS NEEDED FOR NAUSEA AND VOMITING 02/08/18  Yes [name], MD  pantoprazole (PROTONIX) 40 MG tablet Take 1 tablet (40 mg total) by mouth 2 (two) times daily. 02/08/18  Yes [name], MD  potassium chloride SA (K-DUR,KLOR-CON) 20 MEQ tablet TAKE 1 TABLET BY MOUTH  DAILY Patient taking differently: One in the morning and one every other afternoon. 01/29/18  Yes [name], MD  prednisoLONE acetate (PRED FORTE) 1 % ophthalmic suspension Place 1 drop into both eyes 4 (four) times daily.    Yes [provider]  sodium chloride (MURO 128) 2 % ophthalmic solution Place 1 drop into both eyes 3 (three) times daily.    Yes [provider]  traMADol-acetaminophen (ULTRACET) 37.5-325 MG tablet TAKE 1 TABLET BY MOUTH EVERY 4 HOURS AS NEEDED FOR PAIN 03/26/18  Yes [name], MD    Current Facility-Administered Medications  Medication Dose Route Frequency Provider Last Rate Last Dose  . 0.9 %  sodium chloride infusion  250 mL Intravenous PRN Tat, David, MD      . acetaminophen (TYLENOL) tablet  650 mg  650 mg Oral Q6H PRN Orson Eva, MD   650 mg at 04/27/18 2337  . alum & mag hydroxide-simeth (MAALOX/MYLANTA) 200-200-20 MG/5ML suspension 30 mL  30 mL Oral Q6H PRN Tat, David, MD      . budesonide (PULMICORT) nebulizer solution 0.5 mg  0.5 mg Nebulization BID Tat, David, MD   0.5 mg at 04/29/18 2019  . carvedilol (COREG) tablet 3.125 mg  3.125 mg Oral BID WC Tat, Shanon Brow, MD   3.125 mg at 04/28/18 0820  . clopidogrel (PLAVIX) tablet 75 mg  75 mg Oral Q breakfast Tat, Shanon Brow, MD   75 mg at 04/28/18 0819  . docusate sodium (COLACE) capsule 100 mg  100 mg Oral BID Orson Eva, MD   100 mg at 04/29/18 2131  . folic acid (FOLVITE) tablet 1 mg  1 mg Oral Daily Tat, David, MD   1 mg at 04/28/18 0820  . furosemide (LASIX) injection 20 mg  20 mg Intravenous Therisa Doyne, MD   20 mg at 04/30/18 0037  . gabapentin (NEURONTIN) capsule 200 mg  200 mg Oral TID Orson Eva, MD   200 mg at 04/29/18 2131  . heparin injection 5,000 Units  5,000 Units Subcutaneous Franco Collet, MD   5,000 Units at 04/30/18 [phone]  . ipratropium-albuterol (DUONEB) 0.5-2.5 (3) MG/3ML nebulizer solution 3 mL  3 mL Nebulization Q6H Orson Eva, MD   3 mL at 04/30/18 0238  . ipratropium-albuterol (DUONEB) 0.5-2.5 (3) MG/3ML nebulizer solution 3 mL  3 mL Nebulization Q2H PRN Tat, David, MD      . lidocaine (LIDODERM) 5 % 1 patch  1 patch Transdermal Therisa Doyne, MD   1 patch at 04/29/18 2145  . meropenem (MERREM) 500 mg in sodium chloride 0.9 % 100 mL IVPB  500 mg Intravenous Therisa Doyne, MD 200 mL/hr at 04/29/18 2137 500 mg at 04/29/18 2137  . methylPREDNISolone sodium succinate (SOLU-MEDROL) 125 mg/2 mL injection 60 mg  60 mg Intravenous Daily Tat, David, MD   60 mg at 04/29/18 1102  . morphine 2 MG/ML injection 1 mg  1 mg Intravenous Q4H PRN Orson Eva, MD   1 mg at 04/30/18 0616  . ondansetron (ZOFRAN) injection 4 mg  4 mg Intravenous Q6H PRN Tat, David, MD      . pantoprazole (PROTONIX) EC tablet 40 mg  40 mg Oral BID Orson Eva, MD  40 mg at 04/29/18 2131  . polyethylene glycol (MIRALAX / GLYCOLAX) packet 17 g  17 g Oral Daily PRN Tat, David, MD      . potassium chloride (K-DUR,KLOR-CON) CR tablet 10 mEq  10 mEq Oral Daily Tat, David, MD   10 mEq at 04/28/18 0820  . prednisoLONE acetate (PRED FORTE) 1 % ophthalmic suspension 1 drop  1 drop Both Eyes QID Orson Eva, MD   1 drop at 04/29/18 2149  . sodium chloride (MURO 128) 2 % ophthalmic solution 1 drop  1 drop Both Eyes TID Tat, David, MD   1 drop at 04/29/18 2149  . sodium chloride flush (NS) 0.9 % injection 3 mL  3 mL Intravenous Therisa Doyne, MD   3 mL at 04/29/18 1031  . sodium chloride flush (NS) 0.9 % injection 3 mL  3 mL Intravenous PRN Tat, David, MD      . traMADol Veatrice Bourbon) tablet 50 mg  50 mg Oral Q8H PRN Orson Eva, MD   50 mg at 04/29/18 2037  . vitamin B-12 (CYANOCOBALAMIN) tablet 2,000 mcg  2,000 mcg Oral Daily Tat, David, MD   2,000 mcg at 04/28/18 8299    Allergies as of 04/26/2018 - Review Complete 04/26/2018  Allergen Reaction Noted  . Codeine Diarrhea and Nausea Only   . Tape Other (See Comments) 03/16/2017  . Asa [aspirin] Rash and Other (See Comments) 07/27/2013  . Iron Nausea And Vomiting 02/01/2011  . Penicillins Rash     Family History  Problem Relation Age of Onset  . Breast cancer Daughter        also hyperlipidemia  . Breast cancer Daughter        also hyperlipidemia  . Asthma Mother   . Multiple sclerosis Child   . Hyperlipidemia Child   . Colon cancer Neg Hx     Social History   Socioeconomic History  . Marital status: Widowed    Spouse name: Not on file  . Number of children: 5  . Years of education: 6  . Highest education level: Not on file  Occupational History    Employer: RETIRED  Social Needs  . Financial resource strain: Not on file  . Food insecurity:    Worry: Not on file    Inability: Not on file  . Transportation needs:    Medical: Not on file    Non-medical: Not on file  Tobacco Use  .  Smoking status: Former Smoker    Packs/day: 1.50    Years: 20.00    Pack years: 30.00    Types: Cigarettes    Last attempt to quit: 07/05/1975    Years since quitting: 42.8  . Smokeless tobacco: Never Used  . Tobacco comment: Quit smoking in 1975  Substance and Sexual Activity  . Alcohol use: No  . Drug use: No  . Sexual activity: Never  Lifestyle  . Physical activity:    Days per week: Not on file    Minutes per session: Not on file  . Stress: Not on file  Relationships  . Social connections:    Talks on phone: Not on file    Gets together: Not on file    Attends religious service: Not on file    Active member of club or organization: Not on file    Attends meetings of clubs or organizations: Not on file    Relationship status: Not on file  . Intimate partner violence:    Fear of current or  ex partner: Not on file    Emotionally abused: Not on file    Physically abused: Not on file    Forced sexual activity: Not on file  Other Topics Concern  . Not on file  Social History Narrative  . Not on file    Review of Systems: General: Negative for anorexia, weight loss, fever, chills, fatigue, weakness. Eyes: Negative for vision changes.  ENT: Negative for hoarseness, difficulty swallowing , nasal congestion. CV: Negative for chest pain, angina, palpitations, dyspnea on exertion, peripheral edema.  Respiratory: Negative for dyspnea at rest, dyspnea on exertion, cough, sputum, wheezing.  GI: See history of present illness. GU:  Negative for dysuria, hematuria, urinary incontinence, urinary frequency, nocturnal urination.  MS: Negative for joint pain, low back pain.  Derm: Negative for rash or itching.  Neuro: Negative for weakness, abnormal sensation, seizure, frequent headaches, memory loss, confusion.  Psych: Negative for anxiety, depression, suicidal ideation, hallucinations.  Endo: Negative for unusual weight change.  Heme: Negative for bruising or bleeding. Allergy:  Negative for rash or hives.  Physical Exam: Vital signs in last 24 hours: Temp:  [97.8 F (36.6 C)-98.2 F (36.8 C)] 98.2 F (36.8 C) (09/27 0400) Pulse Rate:  [63-89] 63 (09/27 0600) Resp:  [16-24] 18 (09/27 0600) BP: (99-118)/(42-63) 106/52 (09/27 0600) SpO2:  [84 %-98 %] 98 % (09/27 0600) Weight:  [73.4 kg] 73.4 kg (09/27 0500) Last BM Date: 04/29/18 General:   Alert,  Well-developed, well-nourished, pleasant and cooperative in NAD. Appears frail. Eyes:  Sclera clear, no icterus. Conjunctiva pink. Ears:  Normal auditory acuity. Lungs:  Clear throughout to auscultation. No wheezes, crackles, or rhonchi. No acute distress. Heart:  Regular rate and rhythm; no murmurs, clicks, rubs,  or gallops. Abdomen:  Soft, nontender and nondistended. No masses, hepatosplenomegaly or hernias noted. Normal bowel sounds, without guarding, and without rebound.   Rectal:  Deferred.   Msk:  Symmetrical without gross deformities. Pulses:  Normal bilateral DP pulses noted. Extremities:  Without clubbing or edema. Neurologic:  Alert and  oriented x4;  grossly normal neurologically. Psych:  Alert and cooperative. Normal mood and affect.  Intake/Output from previous day: 09/26 0701 - 09/27 0700 In: 390.4 [P.O.:200; IV Piggyback:190.4] Out: -  Intake/Output this shift: No intake/output data recorded.  Lab Results: Recent Labs    04/28/18 1341 04/29/18 0424 04/30/18 0422  WBC 8.4 5.2 6.0  HGB 10.5* 9.8* 9.6*  HCT 35.0* 31.9* 31.1*  PLT 250 239 252   BMET Recent Labs    04/28/18 1341 04/29/18 0424 04/30/18 0422  NA 138 138 140  K 3.5 3.7 3.2*  CL 99 99 101  CO2 28 27 28   GLUCOSE 110* 105* 101*  BUN 21 25* 31*  CREATININE 1.43* 1.37* 1.17*  CALCIUM 8.1* 7.8* 7.8*   LFT Recent Labs    04/28/18 1341  PROT 6.6  ALBUMIN 3.2*  AST 27  ALT 14  ALKPHOS 71  BILITOT 1.0   PT/INR No results for input(s): LABPROT, INR in the last 72 hours. Hepatitis Panel No results for input(s):  HEPBSAG, HCVAB, HEPAIGM, HEPBIGM in the last 72 hours. C-Diff No results for input(s): CDIFFTOX in the last 72 hours.  Studies/Results: Dg Esophagus  Result Date: 04/29/2018 CLINICAL DATA:  Dysphagia, episode difficulty breathing wheezing yesterday EXAM: ESOPHOGRAM/BARIUM SWALLOW TECHNIQUE: Single contrast examination was performed using thin barium. Patient also swallowed a 12.5 mm diameter barium tablet. FLUOROSCOPY TIME:  Fluoroscopy Time:  1 minutes 54 seconds Radiation Exposure Index (if provided  by the fluoroscopic device): 35.3 mGy Number of Acquired Spot Images: multiple fluoroscopic screen captures COMPARISON:  None FINDINGS: Examination performed with patient supine and RPO with head of bed elevated 20 degrees. Grossly normal esophageal distention. No definite soft esophageal mass or stricture. Targeted rapid sequence imaging of the cervical esophagus and hypopharynx showed no laryngeal penetration or aspiration. No significant residuals. Significant diffuse impairment of esophageal motility with poor clearance of contrast from the thoracic esophagus with the patient elevated 20 degrees. Esophageal walls appear smooth without irregularity. 12.5 mm diameter barium tablet passed from oral cavity to the junction of the middle and distal thirds of the thoracic esophagus, did not pass more distally due primarily to severe motility issues in patient supine with head of bed elevated 20 degrees since; unable to assess diameter of the distal third of the thoracic esophagus with the barium tablet. IMPRESSION: Significant age-related esophageal dysmotility. No esophageal mass or stricture grossly identified. Electronically Signed   By: Lavonia Dana M.D.   On: 04/29/2018 15:36   US Venous Img Lower Bilateral  Result Date: 04/28/2018 CLINICAL DATA:  Pain and edema EXAM: BILATERAL LOWER EXTREMITY VENOUS DOPPLER ULTRASOUND TECHNIQUE: Gray-scale sonography with compression, as well as color and duplex  ultrasound, were performed to evaluate the deep venous system from the level of the common femoral vein through the popliteal and proximal calf veins. COMPARISON:  None FINDINGS: Normal compressibility of the common femoral, superficial femoral, and popliteal veins, as well as the proximal calf veins. No filling defects to suggest DVT on grayscale or color Doppler imaging. Doppler waveforms show normal direction of venous flow, normal respiratory phasicity and response to augmentation. 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 The left great saphenous vein is dilated distally, compressible. IMPRESSION: No evidence of  lower extremity deep vein thrombosis. Electronically Signed   By: Lucrezia Europe M.D.   On: 04/28/2018 14:33   Dg Chest Port 1 View  Result Date: 04/28/2018 CLINICAL DATA:  Respiratory distress EXAM: PORTABLE CHEST 1 VIEW COMPARISON:  April 26, 2018 FINDINGS: The mediastinal contour stable heart size is enlarged. The aorta is tortuous. There are increased pulmonary interstitium bilaterally. There is no focal pneumonia or pleural effusion. The visualized skeletal structures are stable. IMPRESSION: Mild congestive heart failure. Electronically Signed   By: Abelardo Diesel M.D.   On: 04/28/2018 13:20    Impression: Pleasant 84 year old female admitted with CHF and fluid overload.  During her hospitalization she developed dysphasia.  Barium pill esophagram found significant age-related dysmotility, no stricture found.  Previous endoscopy was essentially normal esophagus status post dilation which seemed to help for symptoms in 2014.  Currently she is not having significant dysphagia.  She is tolerating a diet this morning and eating pancakes without issue.  She states she is feeling okay.  Speech therapy has seen her and they recommend a regular diet, no noted issues.  Etiology likely severe dysmotility as culprit.  No need for urgent endoscopic evaluation.  I discussed this extensively with  the patient and her family and they seem to agree.  Reassurance was provided.  Plan: 1. Continue current treatments 2. Diet per SLP recommendations 3. Follow-up as outpatient for further evaluation and consideration of any need for outpatient EGD 4. We will sign off for now. Please contact us if we can be of further assistance.   Thank you for allowing [place] to participate in the care of Benedict, DNP, AGNP-C Adult & Gerontological Nurse Practitioner [facility]    LOS: 4 days     04/30/2018, 8:41 AM

## 2018-04-30 NOTE — Care Management Important Message (Signed)
Important Message  Patient Details  Name: [name] [name] MRN: [id number] Date of Birth: [birth year]   Medicare Important Message Given:  Yes    Earl Zellmer, Chauncey Reading, RN 04/30/2018, 10:48 AM

## 2018-04-30 NOTE — Evaluation (Signed)
Clinical/Bedside Swallow Evaluation Patient Details  Name: [name] [name] MRN: [id number] Date of Birth: [birth year]  Today's Date: 04/30/2018 Time: SLP Start Time (ACUTE ONLY): 1604 SLP Stop Time (ACUTE ONLY): 1646 SLP Time Calculation (min) (ACUTE ONLY): 42 min  Past Medical History:  Past Medical History:  Diagnosis Date  . Aortic insufficiency   . Ascending aortic aneurysm ([place])    a. 4.6cm by CT 2018 - pt deferred any further evaluation/invasive management therefore follow-up deferred.  . Bladder infection    h/o  . CHF (congestive heart failure) ([place])   . Chronic atrial fibrillation (H[facility])    a. not on anticoagulation due to prior GIB  . CKD (chronic kidney disease), stage III ([place])   . Coronary artery disease    a. mild nonobstructive by cath 2018.  Marland Kitchen DCIS (ductal carcinoma in situ) of breast 03/14/2013   right breast  . Dehydration    HISTORY   . Fall at home 09/10/12  . Hemorrhoids   . Hip fracture ([place])    hip surgery 2001  . History of blood clots    in leg  . History of knee surgery   . Hypertension   . Iron deficiency anemia, unspecified 03/14/2013   secondary to GI blood loss  . Kidney infection    h/o  . Melanoma of skin ([place]) 03/14/2013  . Melanoma of thigh ([place])    left  . Mini stroke ([place])   . Mitral regurgitation   . Nonischemic cardiomyopathy ([place])    a. EF 35-40% by echo in 03/2017 with cath showing nonobstructive CAD, suspected stress-induced.  . S/P colonoscopy March 2010   RMR: friable anal canal hemorrhoids, hyperplastic ascending polyp, adenomatous descending polyp   . Stomach ulcer    secondary to h.pylori, s/p treatment  . Thyroid condition   . Tricuspid regurgitation   . Venous stasis    edema   Past Surgical History:  Past Surgical History:  Procedure Laterality Date  . APPENDECTOMY  1942  . BREAST LUMPECTOMY  1998  . CARDIAC CATHETERIZATION    . CATARACT EXTRACTION, BILATERAL    . CHOLECYSTECTOMY    . COLONOSCOPY  06/05/11    pancolonic diverticulosis/ileal reosion/abnormal anorectal junction s/p biopsy: path for small intestine and Ti was benign with non-villous atrophy, rectal biopsy with prominent prolapse changes, no acute inflammation  . CORNEAL TRANSPLANT Bilateral 2013  . ENTEROSCOPY N/A 06/13/2013   IRJ:JOAC Gastritis/GI BLEED MOST LIKELY DUE TO DUODENAL ULCERS, AND ? AVMs  . ESOPHAGOGASTRODUODENOSCOPY  06/05/11   small hiatal hernia; + H.PYLORI GASTRITIS, s/p 5 days of Pylera, unable to finish due to N/V  . ESOPHAGOGASTRODUODENOSCOPY N/A 06/13/2013   Dr. Oneida Alar- see enteroscopy  . ESOPHAGOGASTRODUODENOSCOPY (EGD) WITH ESOPHAGEAL DILATION N/A 06/08/2013   ZYS:AYTKZ hiatal hernia;  otherwise normal EGD s/p dilation  . GIVENS CAPSULE STUDY N/A 06/08/2013   Procedure: GIVENS CAPSULE STUDY;  Surgeon: Daneil Dolin, MD;  Location: [facility];  Service: Endoscopy;  Laterality: N/A;  . HEMORRHOID BANDING    . KNEE SURGERY Right 05/2006   total right  . LEFT HEART CATH AND CORONARY ANGIOGRAPHY N/A 03/18/2017   Procedure: LEFT HEART CATH AND CORONARY ANGIOGRAPHY;  Surgeon: Wellington Hampshire, MD;  Location: [facility];  Service: Cardiovascular;  Laterality: N/A;  . LEG SURGERY    . MELANOMA EXCISION Left 08/2006   left leg excision  . NM MYOCAR PERF WALL MOTION  03/27/2010   dipyridamole; small reversible basal to mid-septal defect (?  artifact), post-stress EF 55%, low risk scan   . PARTIAL HYSTERECTOMY  1976  . TONSILLECTOMY    . TOTAL HIP ARTHROPLASTY Left 2001&2004    X 2 FOR LEFT HIP  . TRANSTHORACIC ECHOCARDIOGRAM  10/06/2012   EF 55-60%, mod eccentric hypertrophy, grade 2 diastolic dysfunction; mildly calcifed AV annulus with moderate regurg; aortic root mildly dilated; LA severely dailted; PA peak pressure 31mHg  . VARICOSE VEIN SURGERY     HPI:  [name] is a 84 year old female with a history of systolic CHF, CKD stage IV, chronic atrial fibrillation not on anticoagulation secondary to GI  bleed, hypertension, GERD who presented from her primary care provider's office secondary to worsening lower extremity edema, shortness of breath, and orthopnea.  The patient visited the emergency department on 04/23/2018 with lower abdominal pain.  CT of the abdomen and pelvis at that time showed acute diverticulitis of the sigmoid colon.  CXR reveals: "Hazy left perihilar airspace disease which may reflect atelectasis versus pneumonia." Barium swallow/esophogram completed 9/26 revealing "Significant age-related esophageal dysmotility. No esophageal mass or stricture grossly identified." Also note MBSS completed in 2017 revealing oropharyngeal swallow essentially WNL. BSE ordered to evaluate swallow function   Assessment / Plan / Recommendation Clinical Impression  Pt reports coughing on coke just prior to SLP entering the room and note hoarse vocal quality and reported consistent coughing with and without PO; Pt also reports her throat being "sore". Pt consumed thin liquids with inconsistent immedate and delayed coughing with extended trials of thin liquids despite cues to take one small sip at a time. Nectar thick liquids were consumed with no overt s/sx of aspiration. No overt s/sx of aspiration were noted with puree or regular textures. Recommend continue with Regular diet but downgrade to NECTAR thick liquids and meds to be administered whole with puree. Acknowlege pending d/c for tomorrow 9/28 to SNF. Recommend SNF ST to eval and upgrade as clinically indicated and/or order outpatient MBS to objectively assess the swallowing function if overt s/sx continue.` SLP Visit Diagnosis: Dysphagia, unspecified (R13.10)    Aspiration Risk  Mild aspiration risk    Diet Recommendation Regular;Nectar-thick liquid   Liquid Administration via: Straw;Cup Medication Administration: Whole meds with puree Supervision: Patient able to self feed;Intermittent supervision to cue for compensatory  strategies Compensations: Minimize environmental distractions;Slow rate;Small sips/bites Postural Changes: Seated upright at 90 degrees;Remain upright for at least 30 minutes after po intake    Other  Recommendations Oral Care Recommendations: Oral care BID Other Recommendations: Order thickener from pharmacy   Follow up Recommendations 24 hour supervision/assistance;Skilled Nursing facility      Frequency and Duration min 2x/week  2 weeks       Prognosis Prognosis for Safe Diet Advancement: Good      Swallow Study   General Date of Onset: 04/26/18 HPI: Gola Bribiesca is a 84 year old female with a history of systolic CHF, CKD stage IV, chronic atrial fibrillation not on anticoagulation secondary to GI bleed, hypertension, GERD who presented from her primary care provider's office secondary to worsening lower extremity edema, shortness of breath, and orthopnea.  The patient visited the emergency department on 04/23/2018 with lower abdominal pain.  CT of the abdomen and pelvis at that time showed acute diverticulitis of the sigmoid colon.  CXR reveals: "Hazy left perihilar airspace disease which may reflect atelectasis versus pneumonia." Barium swallow/esophogram completed 9/26 revealing "Significant age-related esophageal dysmotility. No esophageal mass or stricture grossly identified." Also note MBSS completed in 2017 revealing oropharyngeal swallow essentially  WNL. BSE ordered to evaluate swallow function Type of Study: Bedside Swallow Evaluation Previous Swallow Assessment: BSE and MBS 05/2016 Diet Prior to this Study: Regular;Thin liquids Temperature Spikes Noted: No Respiratory Status: Nasal cannula History of Recent Intubation: No Behavior/Cognition: Alert;Cooperative;Pleasant mood Oral Cavity Assessment: Within Functional Limits Oral Care Completed by SLP: Yes Oral Cavity - Dentition: Edentulous(has dentures but was not wearing them for assessment) Vision: Functional for  self-feeding Self-Feeding Abilities: Able to feed self;Needs set up Patient Positioning: Upright in bed Baseline Vocal Quality: Hoarse;Low vocal intensity Volitional Cough: Strong Volitional Swallow: Able to elicit    Oral/Motor/Sensory Function Overall Oral Motor/Sensory Function: Within functional limits   Ice Chips Ice chips: Within functional limits Presentation: Spoon   Thin Liquid Thin Liquid: Impaired Presentation: Cup;Straw Pharyngeal  Phase Impairments: Suspected delayed Swallow;Cough - Delayed;Cough - Immediate    Nectar Thick Nectar Thick Liquid: Within functional limits   Honey Thick Honey Thick Liquid: Not tested   Puree Puree: Within functional limits   Solid    Torrian Canion H. Roddie Mc, CCC-SLP Speech Language Pathologist  Solid: Within functional limits      Wende Bushy 04/30/2018,4:54 PM

## 2018-04-30 NOTE — Clinical Social Work Placement (Signed)
   CLINICAL SOCIAL WORK PLACEMENT  NOTE  Date:  04/30/2018  Patient Details  Name: [name] [name] MRN: [id number] Date of Birth: July 17, 1923  Clinical Social Work is seeking post-discharge placement for this patient at the [facility] level of care (*CSW will initial, date and re-position this form in  chart as items are completed):  Yes   Patient/family provided with [facility] Work Department's list of facilities offering this level of care within the geographic area requested by the patient (or if unable, by the patient's family).  Yes   Patient/family informed of their freedom to choose among providers that offer the needed level of care, that participate in Medicare, Medicaid or managed care program needed by the patient, have an available bed and are willing to accept the patient.  Yes   Patient/family informed of [facility]'s ownership interest in [facility] and [facility], as well as of the fact that they are under no obligation to receive care at these facilities.  PASRR submitted to EDS on 04/28/18     PASRR number received on 04/28/18     Existing PASRR number confirmed on       FL2 transmitted to all facilities in geographic area requested by pt/family on 04/28/18     FL2 transmitted to all facilities within larger geographic area on       Patient informed that his/her managed care company has contracts with or will negotiate with certain facilities, including the following:        Yes   Patient/family informed of bed offers received.  Patient chooses bed at [facility]     Physician recommends and patient chooses bed at      Patient to be transferred to [facility] on 05/01/18.  Patient to be transferred to facility by w/c     Patient family notified on 04/30/18 of transfer.  Name of family member notified:  daughter in room     PHYSICIAN       Additional Comment: Pt to be discharged to [facility] on  Saturday 9/28 if stable. Updated Kerri at [facility]. Updated pt and family at bedside. RN to call report and transport pt once dc'd. Weekend CSW will be available if needed.   _______________________________________________ Shade Flood, LCSW 04/30/2018, 12:50 PM

## 2018-04-30 NOTE — Progress Notes (Signed)
Pt O2 saturation is dropping during sleep. Increase will occur when she awakes. Pt placed on 10L HFNC to maintain O2 saturation of 90%

## 2018-04-30 NOTE — Progress Notes (Signed)
Late entry from 04/29/18 @ 2130- After giving pt her medications she had a coughing spell and stated she couldn't breathe. Pts head was already up at a 90 degree angle when taking medications. [place] 5L- 02 remained in mid 90's and RR 22.  The coughing lasted about 5 minutes and then she finally stopped. Adv family pt is due for speech evaluation today. Will continue to monitor pt

## 2018-04-30 NOTE — Discharge Summary (Signed)
Physician Discharge Summary  [name] [name] ERX:[id number] DOB: 06-03-1923 DOA: 04/26/2018  PCP: [name], MD  Admit date: 04/26/2018 Discharge date: 05/01/2018  Admitted From: Home Disposition:  [place]  Recommendations for Outpatient Follow-up:  1. Follow up with PCP in 1-2 weeks 2. Please obtain BMP/CBC in one week 3. Please wean oxygen off for saturations >92%    Discharge Condition: Stable CODE STATUS: DNR Diet recommendation: Heart Healthy diet with nectar thickened liquids   Brief/Interim Summary: 84 year old female with a history of systolic CHF, CKD stage IV, chronic atrial fibrillation not on anticoagulation secondary to GI bleed, hypertension, GERD presenting from her primary care provider's office secondary to worsening lower extremity edema, shortness of breath, and orthopnea. The patient visited the emergency department on 04/23/2018 with lower abdominal pain. CT of the abdomen and pelvis at that time showed acute diverticulitis of the sigmoid colon.The patient was started on ciprofloxacin 500 mg twice daily and metronidazole 500 mg every 8 hours. On 04/24/2018, the patient became increasingly confused with hallucinations. As result, the patient's family stopped her antibiotics. She went to see her primary care provider on 04/26/2018. She was noted to have labored respirations with oxygen saturation of 90% on room air. As result, the patient was sent to the emergency department for further evaluation. Work-up in the emergency department including chest x-ray showed left perihilar airspace disease with WBC 8.0. BNP was 656. The patient was started on intravenous furosemide secondary to concerns for fluid overload.On 04/28/18, patient developed respiratory distress, and she was moved to stepdown unit.  Her abx were broaden to cover for aspiration and steroids were started.  She improved clinically and was moved back to medical floor on 04/30/18.  Discharge  Diagnoses:  Acute on chronic systolic CHF -9/50/9326 echo EF 60 to 65%, no WMA, moderate AI, mild left ear/TR, moderate TR, PASP 43 -Continue IV furosemide-->d/c with lasix 40 mg am, 20 mg pm -The patient had gained 10 pounds over the last 5 to 6 months -Admission weight 170.2 pounds -03/31/2018 weight 168 pounds at cardiology office at which time the patient was felt to be fluid overloaded -Daily weights--NEG 11.5 lbs since admission--discharge weight 158.7 -Patient has had indiscretion with fluid intake -d/c with lasix 40 mg po in am and 20 mg in pm  Dysphagia -this has been a chronic problem -04/29/18-- esophagram-->age related esophageal dysmotility--no mass or stricture -discussed with speech therapy-->2017 MBSS-->Oropharyngeal swallow essentially WNLwith the exception of slight premature spillage with straw sips  -family remains concerned despite reassurance-->consult GI-->Follow-up as outpatient for further evaluation and consideration of any need for outpatient EGD -04/30/18 speech recommendation-->regular diet with nectar thickened liquid  Aspiration pneumonitis -04/28/18 personally reviewed cxr--increase interstitial markings, increased basilar opacities R>L -merrem started 04/28/18 -d/c levofloxacin + metronidazole x 5 days to complete 7 days of tx -pt has PCN allergy -pt likely had component of COPD exacerbation-->treated with IV solumedrol and bronchodilators-->d/c with 3 more days of prednison  Acute diverticulitis -Abdominal pain improved -[facility] to [facility] -discharge with levofloxacin + metronidazole x 5 more days to complete 10 days of tx -04/23/2018 CT abdomen and pelvis acute diverticulitis at the junction of the sigmoid and rectum. There is some mild diverticular inflammatory changes in the more proximal sigmoid colon.  Acute respiratory failure with hypoxia -had oxygen saturation 89% on RA at time of admission -due to [facility] aspiration of  pneumonia -suspect underlying undiagnosed COPD with hx of tobacco use -improving -now stable on3L -continue duonebs -continue  IV solumedrol>>>po prednisone x 3 more days  Chronic atrial fibrillation -Rate controlled -Continue carvedilol -Not on anticoagulation secondary to history of GI bleed -CHADSVASc = 5 -continue plavix -Personally reviewed EKG--atrial fibrillation, RBBB, unchanged  CKD stage IV -Baseline creatinine 1.3-1.7 -Monitor with diuresis  Essential hypertension -Continue carvedilol -Well-controlled  Coronary artery disease -No chest pain presently -03/18/2017 heart catheterization--mild nonobstructive disease  Chronic right hip pain -Continue Ultracet, gabapentin  Anemia of chronic disease -Check iron studies--iron saturation 67 %, ferritin 53 -Serum O03--5597 -Folic CBUL--84.5 -FOBTpositive--will need outpt GI follow up; may be related to diverticulitis  Hypokalemia -repleted -check mag--2.2   Discharge Instructions   Allergies as of 05/01/2018      Reactions   Codeine Diarrhea, Nausea Only   Tape Other (See Comments)   SKIN IS VERY THIN AND TEARS AND BRUISES EASILY; PLEASE USE COBAN WRAP!!   Asa [aspirin] Rash, Other (See Comments)   Petechia   Iron Nausea And Vomiting   Oral iron causes nausea and vomiting   Penicillins Rash   Has patient had a PCN reaction causing immediate rash, facial/tongue/throat swelling, SOB or lightheadedness with hypotension: Yes Has patient had a PCN reaction causing severe rash involving mucus membranes or skin necrosis: Unknown Has patient had a PCN reaction that required hospitalization: No Has patient had a PCN reaction occurring within the last 10 years: No If all of the above answers are "NO", then may proceed with Cephalosporin use.      Medication List    TAKE these medications   carvedilol 3.125 MG tablet Commonly known as:  COREG TAKE 1 TABLET BY MOUTH TWO  TIMES DAILY WITH MEALS     clopidogrel 75 MG tablet Commonly known as:  PLAVIX Take 1 tablet (75 mg total) by mouth daily with breakfast.   cyanocobalamin 2000 MCG tablet Take 1 tablet (2,000 mcg total) by mouth daily.   docusate sodium 100 MG capsule Commonly known as:  COLACE Take 200 mg by mouth 2 (two) times daily.   folic acid 1 MG tablet Commonly known as:  FOLVITE TAKE 1 TABLET BY MOUTH EVERY DAY   furosemide 20 MG tablet Commonly known as:  LASIX 04/09/18 dose decreased to 40 mg am and 20 mg pm   gabapentin 100 MG capsule Commonly known as:  NEURONTIN TAKE 2 CAPSULES BY MOUTH 3  TIMES DAILY   hydrocortisone 25 MG suppository Commonly known as:  ANUSOL-HC Place 1 suppository (25 mg total) rectally every 12 (twelve) hours. What changed:  when to take this   ipratropium-albuterol 0.5-2.5 (3) MG/3ML Soln Commonly known as:  DUONEB Take 3 mLs by nebulization every 6 (six) hours as needed (sob, wheeze).   levofloxacin 250 MG tablet Commonly known as:  LEVAQUIN Take 1 tablet (250 mg total) by mouth daily.   metroNIDAZOLE 500 MG tablet Commonly known as:  FLAGYL Take 1 tablet (500 mg total) by mouth 3 (three) times daily. X 5 days   mometasone 0.1 % cream Commonly known as:  ELOCON Apply 1 application topically daily.   ondansetron 4 MG tablet Commonly known as:  ZOFRAN TAKE 1 TABLET BY MOUTH  EVERY 8 HOURS AS NEEDED FOR NAUSEA AND VOMITING   pantoprazole 40 MG tablet Commonly known as:  PROTONIX Take 1 tablet (40 mg total) by mouth 2 (two) times daily.   potassium chloride SA 20 MEQ tablet Commonly known as:  K-DUR,KLOR-CON TAKE 1 TABLET BY MOUTH  DAILY What changed:    how much to take  how to take this  when to take this  additional instructions   prednisoLONE acetate 1 % ophthalmic suspension Commonly known as:  PRED FORTE Place 1 drop into both eyes 4 (four) times daily.   predniSONE 50 MG tablet Commonly known as:  DELTASONE Take 1 tablet (50 mg total) by mouth daily  with breakfast. X 3 days   sodium chloride 2 % ophthalmic solution Commonly known as:  MURO 128 Place 1 drop into both eyes 3 (three) times daily.   traMADol-acetaminophen 37.5-325 MG tablet Commonly known as:  ULTRACET Take 1 tablet by mouth every 4 (four) hours as needed. for pain      Contact information for after-discharge care    [facility] .   Service:  Skilled Nursing Contact information: [address] [place] [address] [phone]             Allergies  Allergen Reactions  . Codeine Diarrhea and Nausea Only  . Tape Other (See Comments)    SKIN IS VERY THIN AND TEARS AND BRUISES EASILY; PLEASE USE COBAN WRAP!!  . Diona Fanti [Aspirin] Rash and Other (See Comments)    Petechia   . Iron Nausea And Vomiting    Oral iron causes nausea and vomiting  . Penicillins Rash    Has patient had a PCN reaction causing immediate rash, facial/tongue/throat swelling, SOB or lightheadedness with hypotension: Yes Has patient had a PCN reaction causing severe rash involving mucus membranes or skin necrosis: Unknown Has patient had a PCN reaction that required hospitalization: No Has patient had a PCN reaction occurring within the last 10 years: No If all of the above answers are "NO", then may proceed with Cephalosporin use.     Consultations:  GI--[place]   Procedures/Studies: Ct Abdomen Pelvis Wo Contrast  Result Date: 04/23/2018 CLINICAL DATA:  Abdominal distension EXAM: CT ABDOMEN AND PELVIS WITHOUT CONTRAST TECHNIQUE: Multidetector CT imaging of the abdomen and pelvis was performed following the standard protocol without IV contrast. COMPARISON:  None. FINDINGS: Lower chest: Mild scarring is noted in the bases bilaterally. Heart is enlarged. The cardiac blood pool is somewhat hypodense which may be related to underlying anemia. Hepatobiliary: No focal liver abnormality is seen. Status post cholecystectomy. No  biliary dilatation. Pancreas: Unremarkable. No pancreatic ductal dilatation or surrounding inflammatory changes. Spleen: Normal in size without focal abnormality. Adrenals/Urinary Tract: Adrenal glands are within normal limits. Kidneys are well visualized bilaterally without renal calculi or obstructive changes. Left renal cyst is noted. The bladder is decompressed. Stomach/Bowel: There are inflammatory changes at the junction of the sigmoid and rectum with associated diverticular change and wall thickening. This is consistent with acute diverticulitis. Scattered mild inflammatory change in the more proximal sigmoid colon is seen. The appendix has been surgically removed. No other obstructive or inflammatory changes are noted. No abscess or perforation is seen. Vascular/Lymphatic: Aortic atherosclerosis. No enlarged abdominal or pelvic lymph nodes. Reproductive: Uterus has been surgically removed. No adnexal mass is seen. Other: No abdominal wall hernia or abnormality. No abdominopelvic ascites. Musculoskeletal: Left hip replacement is noted. There is a chronic appearing right hip joint effusion. Degenerative changes of the lumbar spine are noted. No compression deformity is seen. IMPRESSION: Changes consistent with acute diverticulitis at the junction of the sigmoid and rectum with some more mild diverticular inflammatory change in the more proximal sigmoid colon. No abscess or perforation is noted at this time. Chronic right hip joint effusion. No other  acute abnormality is noted. Electronically Signed   By: Inez Catalina M.D.   On: 04/23/2018 12:42   Dg Chest 2 View  Result Date: 04/26/2018 CLINICAL DATA:  Shortness of breath EXAM: CHEST - 2 VIEW COMPARISON:  None FINDINGS: The lungs are hyperinflated likely secondary to COPD. There is hazy left perihilar airspace disease which may reflect atelectasis versus pneumonia. There is no pleural effusion or pneumothorax. There is stable cardiomegaly. The osseous  structures are unremarkable. IMPRESSION: Hazy left perihilar airspace disease which may reflect atelectasis versus pneumonia. Electronically Signed   By: Kathreen Devoid   On: 04/26/2018 11:47   Dg Esophagus  Result Date: 04/29/2018 CLINICAL DATA:  Dysphagia, episode difficulty breathing wheezing yesterday EXAM: ESOPHOGRAM/BARIUM SWALLOW TECHNIQUE: Single contrast examination was performed using thin barium. Patient also swallowed a 12.5 mm diameter barium tablet. FLUOROSCOPY TIME:  Fluoroscopy Time:  1 minutes 54 seconds Radiation Exposure Index (if provided by the fluoroscopic device): 35.3 mGy Number of Acquired Spot Images: multiple fluoroscopic screen captures COMPARISON:  None FINDINGS: Examination performed with patient supine and RPO with head of bed elevated 20 degrees. Grossly normal esophageal distention. No definite soft esophageal mass or stricture. Targeted rapid sequence imaging of the cervical esophagus and hypopharynx showed no laryngeal penetration or aspiration. No significant residuals. Significant diffuse impairment of esophageal motility with poor clearance of contrast from the thoracic esophagus with the patient elevated 20 degrees. Esophageal walls appear smooth without irregularity. 12.5 mm diameter barium tablet passed from oral cavity to the junction of the middle and distal thirds of the thoracic esophagus, did not pass more distally due primarily to severe motility issues in patient supine with head of bed elevated 20 degrees since; unable to assess diameter of the distal third of the thoracic esophagus with the barium tablet. IMPRESSION: Significant age-related esophageal dysmotility. No esophageal mass or stricture grossly identified. Electronically Signed   By: Lavonia Dana M.D.   On: 04/29/2018 15:36   US Venous Img Lower Bilateral  Result Date: 04/28/2018 CLINICAL DATA:  Pain and edema EXAM: BILATERAL LOWER EXTREMITY VENOUS DOPPLER ULTRASOUND TECHNIQUE: Gray-scale sonography  with compression, as well as color and duplex ultrasound, were performed to evaluate the deep venous system from the level of the common femoral vein through the popliteal and proximal calf veins. COMPARISON:  None FINDINGS: Normal compressibility of the common femoral, superficial femoral, and popliteal veins, as well as the proximal calf veins. No filling defects to suggest DVT on grayscale or color Doppler imaging. Doppler waveforms show normal direction of venous flow, normal respiratory phasicity and response to augmentation. 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 The left great saphenous vein is dilated distally, compressible. IMPRESSION: No evidence of  lower extremity deep vein thrombosis. Electronically Signed   By: Lucrezia Europe M.D.   On: 04/28/2018 14:33   Dg Chest Port 1 View  Result Date: 04/28/2018 CLINICAL DATA:  Respiratory distress EXAM: PORTABLE CHEST 1 VIEW COMPARISON:  April 26, 2018 FINDINGS: The mediastinal contour stable heart size is enlarged. The aorta is tortuous. There are increased pulmonary interstitium bilaterally. There is no focal pneumonia or pleural effusion. The visualized skeletal structures are stable. IMPRESSION: Mild congestive heart failure. Electronically Signed   By: Abelardo Diesel M.D.   On: 04/28/2018 13:20        Discharge Exam: Vitals:   05/01/18 0559 05/01/18 1043  BP: (!) 102/41   Pulse: (!) 59   Resp: 18   Temp: 98.3 F (36.8 C)  SpO2: 96% 96%   Vitals:   04/30/18 2247 05/01/18 0500 05/01/18 0559 05/01/18 1043  BP:   (!) 102/41   Pulse: 73  (!) 59   Resp:   18   Temp:   98.3 F (36.8 C)   TempSrc:   Oral   SpO2: 96%  96% 96%  Weight:  72 kg    Height:        General: Pt is alert, awake, not in acute distress Cardiovascular: IRRR, S1/S2 +, no rubs, no gallops Respiratory: bibasilar crackles, no wheeze Abdominal: Soft, NT, ND, bowel sounds + Extremities: trace LE edema, no cyanosis   The results of significant  diagnostics from this hospitalization (including imaging, microbiology, ancillary and laboratory) are listed below for reference.    Significant Diagnostic Studies: Ct Abdomen Pelvis Wo Contrast  Result Date: 04/23/2018 CLINICAL DATA:  Abdominal distension EXAM: CT ABDOMEN AND PELVIS WITHOUT CONTRAST TECHNIQUE: Multidetector CT imaging of the abdomen and pelvis was performed following the standard protocol without IV contrast. COMPARISON:  None. FINDINGS: Lower chest: Mild scarring is noted in the bases bilaterally. Heart is enlarged. The cardiac blood pool is somewhat hypodense which may be related to underlying anemia. Hepatobiliary: No focal liver abnormality is seen. Status post cholecystectomy. No biliary dilatation. Pancreas: Unremarkable. No pancreatic ductal dilatation or surrounding inflammatory changes. Spleen: Normal in size without focal abnormality. Adrenals/Urinary Tract: Adrenal glands are within normal limits. Kidneys are well visualized bilaterally without renal calculi or obstructive changes. Left renal cyst is noted. The bladder is decompressed. Stomach/Bowel: There are inflammatory changes at the junction of the sigmoid and rectum with associated diverticular change and wall thickening. This is consistent with acute diverticulitis. Scattered mild inflammatory change in the more proximal sigmoid colon is seen. The appendix has been surgically removed. No other obstructive or inflammatory changes are noted. No abscess or perforation is seen. Vascular/Lymphatic: Aortic atherosclerosis. No enlarged abdominal or pelvic lymph nodes. Reproductive: Uterus has been surgically removed. No adnexal mass is seen. Other: No abdominal wall hernia or abnormality. No abdominopelvic ascites. Musculoskeletal: Left hip replacement is noted. There is a chronic appearing right hip joint effusion. Degenerative changes of the lumbar spine are noted. No compression deformity is seen. IMPRESSION: Changes consistent  with acute diverticulitis at the junction of the sigmoid and rectum with some more mild diverticular inflammatory change in the more proximal sigmoid colon. No abscess or perforation is noted at this time. Chronic right hip joint effusion. No other acute abnormality is noted. Electronically Signed   By: Inez Catalina M.D.   On: 04/23/2018 12:42   Dg Chest 2 View  Result Date: 04/26/2018 CLINICAL DATA:  Shortness of breath EXAM: CHEST - 2 VIEW COMPARISON:  None FINDINGS: The lungs are hyperinflated likely secondary to COPD. There is hazy left perihilar airspace disease which may reflect atelectasis versus pneumonia. There is no pleural effusion or pneumothorax. There is stable cardiomegaly. The osseous structures are unremarkable. IMPRESSION: Hazy left perihilar airspace disease which may reflect atelectasis versus pneumonia. Electronically Signed   By: Kathreen Devoid   On: 04/26/2018 11:47   Dg Esophagus  Result Date: 04/29/2018 CLINICAL DATA:  Dysphagia, episode difficulty breathing wheezing yesterday EXAM: ESOPHOGRAM/BARIUM SWALLOW TECHNIQUE: Single contrast examination was performed using thin barium. Patient also swallowed a 12.5 mm diameter barium tablet. FLUOROSCOPY TIME:  Fluoroscopy Time:  1 minutes 54 seconds Radiation Exposure Index (if provided by the fluoroscopic device): 35.3 mGy Number of Acquired Spot Images: multiple fluoroscopic screen captures COMPARISON:  None FINDINGS: Examination performed with patient supine and RPO with head of bed elevated 20 degrees. Grossly normal esophageal distention. No definite soft esophageal mass or stricture. Targeted rapid sequence imaging of the cervical esophagus and hypopharynx showed no laryngeal penetration or aspiration. No significant residuals. Significant diffuse impairment of esophageal motility with poor clearance of contrast from the thoracic esophagus with the patient elevated 20 degrees. Esophageal walls appear smooth without irregularity. 12.5 mm  diameter barium tablet passed from oral cavity to the junction of the middle and distal thirds of the thoracic esophagus, did not pass more distally due primarily to severe motility issues in patient supine with head of bed elevated 20 degrees since; unable to assess diameter of the distal third of the thoracic esophagus with the barium tablet. IMPRESSION: Significant age-related esophageal dysmotility. No esophageal mass or stricture grossly identified. Electronically Signed   By: Lavonia Dana M.D.   On: 04/29/2018 15:36   US Venous Img Lower Bilateral  Result Date: 04/28/2018 CLINICAL DATA:  Pain and edema EXAM: BILATERAL LOWER EXTREMITY VENOUS DOPPLER ULTRASOUND TECHNIQUE: Gray-scale sonography with compression, as well as color and duplex ultrasound, were performed to evaluate the deep venous system from the level of the common femoral vein through the popliteal and proximal calf veins. COMPARISON:  None FINDINGS: Normal compressibility of the common femoral, superficial femoral, and popliteal veins, as well as the proximal calf veins. No filling defects to suggest DVT on grayscale or color Doppler imaging. Doppler waveforms show normal direction of venous flow, normal respiratory phasicity and response to augmentation. 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 The left great saphenous vein is dilated distally, compressible. IMPRESSION: No evidence of  lower extremity deep vein thrombosis. Electronically Signed   By: Lucrezia Europe M.D.   On: 04/28/2018 14:33   Dg Chest Port 1 View  Result Date: 04/28/2018 CLINICAL DATA:  Respiratory distress EXAM: PORTABLE CHEST 1 VIEW COMPARISON:  April 26, 2018 FINDINGS: The mediastinal contour stable heart size is enlarged. The aorta is tortuous. There are increased pulmonary interstitium bilaterally. There is no focal pneumonia or pleural effusion. The visualized skeletal structures are stable. IMPRESSION: Mild congestive heart failure. Electronically Signed    By: Abelardo Diesel M.D.   On: 04/28/2018 13:20     Microbiology: Recent Results (from the past 240 hour(s))  MRSA PCR Screening     Status: None   Collection Time: 04/28/18  2:42 PM  Result Value Ref Range Status   MRSA by PCR NEGATIVE NEGATIVE Final    Comment:        The GeneXpert MRSA Assay (FDA approved for NASAL specimens only), is one component of a comprehensive MRSA colonization surveillance program. It is not intended to diagnose MRSA infection nor to guide or monitor treatment for MRSA infections. Performed at [facility], [address] [place], [place] [address]      Labs: Basic Metabolic Panel: Recent Labs  Lab 04/28/18 0447 04/28/18 1341 04/29/18 0424 04/30/18 0422 05/01/18 0653  NA 138 138 138 140 144  K 3.3* 3.5 3.7 3.2* 3.3*  CL 101 99 99 101 103  CO2 29 28 27 28 31   GLUCOSE 99 110* 105* 101* 113*  BUN 24* 21 25* 31* 31*  CREATININE 1.51* 1.43* 1.37* 1.17* 1.20*  CALCIUM 7.7* 8.1* 7.8* 7.8* 7.9*  MG  --   --  2.2  --   --    Liver Function Tests: Recent Labs  Lab 04/26/18 1114 04/28/18 1341  AST 24  27  ALT 12 14  ALKPHOS 64 71  BILITOT 0.8 1.0  PROT 6.7 6.6  ALBUMIN 3.1* 3.2*   No results for input(s): LIPASE, AMYLASE in the last 168 hours. No results for input(s): AMMONIA in the last 168 hours. CBC: Recent Labs  Lab 04/26/18 1114 04/28/18 1341 04/29/18 0424 04/30/18 0422  WBC 6.9 8.4 5.2 6.0  NEUTROABS 5.5  --   --   --   HGB 9.3* 10.5* 9.8* 9.6*  HCT 30.8* 35.0* 31.9* 31.1*  MCV 95.1 93.3 91.7 90.9  PLT 217 250 239 252   Cardiac Enzymes: Recent Labs  Lab 04/26/18 1817 04/27/18 0008 04/27/18 0548 04/28/18 1341 04/28/18 1857  TROPONINI <0.03 <0.03 <0.03 <0.03 0.11*   BNP: Invalid input(s): POCBNP CBG: Recent Labs  Lab 04/28/18 1303  GLUCAP 104*    Time coordinating discharge:  36 minutes  Signed:  Orson Eva, DO [facility] Pager: [phone] 05/01/2018, 10:51 AM

## 2018-04-30 NOTE — Progress Notes (Signed)
PROGRESS NOTE  [name] [name] TMH:[id number] DOB: 1923-04-23 DOA: 04/26/2018 PCP: [name], MD  Brief History: 84 year old female with a history of systolic CHF, CKD stage IV, chronic atrial fibrillation not on anticoagulation secondary to GI bleed, hypertension, GERD presenting from her primary care provider's office secondary to worsening lower extremity edema, shortness of breath, and orthopnea. The patient visited the emergency department on 04/23/2018 with lower abdominal pain. CT of the abdomen and pelvis at that time showed acute diverticulitis of the sigmoid colon.The patient was started on ciprofloxacin 500 mg twice daily and metronidazole 500 mg every 8 hours. On 04/24/2018, the patient became increasingly confused with hallucinations. As result, the patient's family stopped her antibiotics. She went to see her primary care provider on 04/26/2018. She was noted to have labored respirations with oxygen saturation of 90% on room air. As result, the patient was sent to the emergency department for further evaluation. Work-up in the emergency department including chest x-ray showed left perihilar airspace disease with WBC 8.0. BNP was 656. The patient was started on intravenous furosemide secondary to concerns for fluid overload.  On 04/28/18, patient developed respiratory distress, and she was moved to stepdown unit.  Assessment/Plan: Acute on chronic systolic CHF -04/24/1940 echo EF 60 to 65%, no WMA, moderate AI, mild left ear/TR, moderate TR, PASP 43 -Continue IV furosemide -The patient has gained 10 pounds over the last 5 to 6 months -Admission weight 170.2 pounds -03/31/2018 weight 168 pounds at cardiology office at which time the patient was felt to be fluid overloaded -Daily weights--NEG 8.4 lbs since admission -Patient has had indiscretion with fluid intake  Dysphagia -Order esophagram--age related esophageal dysmotility--no mass or stricture -discussed  with speech therapy-->2017 MBSS-->Oropharyngeal swallow essentially WNLwith the exception of slight premature spillage with straw sips  -family remains concerned despite reassurance-->consult GI  Aspiration pneumonitis -04/28/18 personally reviewed cxr--increase interstitial markings, increased basilar opacities R>L  Acute diverticulitis -Abdominal pain improved -Cipro and Flagyl changed to merrem -04/23/2018 CT abdomen and pelvis acute diverticulitis at the junction of the sigmoid and rectum. There is some mild diverticular inflammatory changes in the more proximal sigmoid colon.  Acute respiratory failure with hypoxia -had oxygen saturation 89% on RA at time of admission -due to CHF and aspiration of pneumonia -suspect underlying undiagnosed COPD with hx of tobacco use -improving -now stable on 3L -continue duonebs -continue IV solumedrol>>>po prednisone  Chronic atrial fibrillation -Rate controlled -Continue carvedilol -Not on anticoagulation secondary to history of GI bleed -CHADSVASc = 5 -continue plavix -Personally reviewed EKG--atrial fibrillation, RBBB, unchanged  CKD stage IV -Baseline creatinine 1.3-1.7 -Monitor with diuresis  Essential hypertension -Continue carvedilol -Well-controlled  Coronary artery disease -No chest pain presently -03/18/2017 heart catheterization--mild nonobstructive disease  Chronic right hip pain -Continue Ultracet, gabapentin  Anemia of chronic disease -Check iron studies -Serum D40--8144 -Folic YJEH--63.1 -FOBTpositive--will need outpt GI follow up; may be related to diverticulitis  Hypokalemia -repleted -check mag--2.2    Disposition Plan: SNF 9/28 if cleared by GI Family Communication:Sister updatedat bedside 9/27  Consultants: none  Code Status:  DNR  DVT Prophylaxis: [place] Heparin   Procedures: As Listed in Progress Note Above  Antibiotics: merrem 9/25>>> Cipro/flagyl  9/23>>>9/25     Subjective: Pt still complains of dysphagia sensation but she is sitting up and eating breakfast this am without difficulty.  No n/v.  No cp, sob.  Breathing better.  No abd pain.  No f/c  Objective:  Vitals:   04/30/18 0320 04/30/18 0400 04/30/18 0500 04/30/18 0600  BP:  (!) 108/42 (!) 101/42 (!) 106/52  Pulse:  68 73 63  Resp:  18 17 18   Temp:  98.2 F (36.8 C)    TempSrc:  Oral    SpO2: 91% 97% 92% 98%  Weight:   73.4 kg   Height:        Intake/Output Summary (Last 24 hours) at 04/30/2018 0830 Last data filed at 04/30/2018 0303 Gross per 24 hour  Intake 390.39 ml  Output -  Net 390.39 ml   Weight change: -0.5 kg Exam:   General:  Pt is alert, follows commands appropriately, not in acute distress  HEENT: No icterus, No thrush, No neck mass, [place]/AT  Cardiovascular: RRR, S1/S2, no rubs, no gallops  Respiratory: bibasilar crackles, no wheeze  Abdomen: Soft/+BS, non tender, non distended, no guarding  Extremities: No edema, No lymphangitis, No petechiae, No rashes, no synovitis   Data Reviewed: I have personally reviewed following labs and imaging studies Basic Metabolic Panel: Recent Labs  Lab 04/27/18 0547 04/28/18 0447 04/28/18 1341 04/29/18 0424 04/30/18 0422  NA 138 138 138 138 140  K 3.7 3.3* 3.5 3.7 3.2*  CL 102 101 99 99 101  CO2 26 29 28 27 28   GLUCOSE 97 99 110* 105* 101*  BUN 28* 24* 21 25* 31*  CREATININE 1.74* 1.51* 1.43* 1.37* 1.17*  CALCIUM 7.6* 7.7* 8.1* 7.8* 7.8*  MG  --   --   --  2.2  --    Liver Function Tests: Recent Labs  Lab 04/23/18 1106 04/26/18 1114 04/28/18 1341  AST 14* 24 27  ALT 9 12 14   ALKPHOS 61 64 71  BILITOT 0.9 0.8 1.0  PROT 6.7 6.7 6.6  ALBUMIN 3.1* 3.1* 3.2*   Recent Labs  Lab 04/23/18 1106  LIPASE 24   No results for input(s): AMMONIA in the last 168 hours. Coagulation Profile: No results for input(s): INR, PROTIME in the last 168 hours. CBC: Recent Labs  Lab 04/23/18 1106  04/26/18 1114 04/28/18 1341 04/29/18 0424 04/30/18 0422  WBC 8.0 6.9 8.4 5.2 6.0  NEUTROABS 5.9 5.5  --   --   --   HGB 8.9* 9.3* 10.5* 9.8* 9.6*  HCT 29.7* 30.8* 35.0* 31.9* 31.1*  MCV 94.6 95.1 93.3 91.7 90.9  PLT 172 217 250 239 252   Cardiac Enzymes: Recent Labs  Lab 04/26/18 1817 04/27/18 0008 04/27/18 0548 04/28/18 1341 04/28/18 1857  TROPONINI <0.03 <0.03 <0.03 <0.03 0.11*   BNP: Invalid input(s): POCBNP CBG: Recent Labs  Lab 04/28/18 1303  GLUCAP 104*   HbA1C: No results for input(s): HGBA1C in the last 72 hours. Urine analysis:    Component Value Date/Time   COLORURINE YELLOW 04/23/2018 [place] 04/23/2018 1032   LABSPEC 1.015 04/23/2018 1032   PHURINE 5.0 04/23/2018 1032   GLUCOSEU NEGATIVE 04/23/2018 1032   HGBUR SMALL (A) 04/23/2018 1032   BILIRUBINUR NEGATIVE 04/23/2018 1032   KETONESUR NEGATIVE 04/23/2018 1032   PROTEINUR NEGATIVE 04/23/2018 1032   UROBILINOGEN 1.0 12/18/2014 1735   NITRITE NEGATIVE 04/23/2018 1032   LEUKOCYTESUR NEGATIVE 04/23/2018 1032   Sepsis Labs: @LABRCNTIP (procalcitonin:4,lacticidven:4) ) Recent Results (from the past 240 hour(s))  MRSA PCR Screening     Status: None   Collection Time: 04/28/18  2:42 PM  Result Value Ref Range Status   MRSA by PCR NEGATIVE NEGATIVE Final    Comment:  The GeneXpert MRSA Assay (FDA approved for NASAL specimens only), is one component of a comprehensive MRSA colonization surveillance program. It is not intended to diagnose MRSA infection nor to guide or monitor treatment for MRSA infections. Performed at [facility], [address] [place], [place] [address]      Scheduled Meds: . budesonide (PULMICORT) nebulizer solution  0.5 mg Nebulization BID  . carvedilol  3.125 mg Oral BID WC  . clopidogrel  75 mg Oral Q breakfast  . docusate sodium  100 mg Oral BID  . folic acid  1 mg Oral Daily  . furosemide  20 mg Intravenous Q12H  . gabapentin  200 mg Oral  TID  . heparin injection (subcutaneous)  5,000 Units Subcutaneous Q8H  . ipratropium-albuterol  3 mL Nebulization Q6H  . lidocaine  1 patch Transdermal Q12H  . methylPREDNISolone (SOLU-MEDROL) injection  60 mg Intravenous Daily  . pantoprazole  40 mg Oral BID  . potassium chloride SA  10 mEq Oral Daily  . prednisoLONE acetate  1 drop Both Eyes QID  . sodium chloride  1 drop Both Eyes TID  . sodium chloride flush  3 mL Intravenous Q12H  . cyanocobalamin  2,000 mcg Oral Daily   Continuous Infusions: . sodium chloride    . meropenem (MERREM) IV 500 mg (04/29/18 2137)    Procedures/Studies: Ct Abdomen Pelvis Wo Contrast  Result Date: 04/23/2018 CLINICAL DATA:  Abdominal distension EXAM: CT ABDOMEN AND PELVIS WITHOUT CONTRAST TECHNIQUE: Multidetector CT imaging of the abdomen and pelvis was performed following the standard protocol without IV contrast. COMPARISON:  None. FINDINGS: Lower chest: Mild scarring is noted in the bases bilaterally. Heart is enlarged. The cardiac blood pool is somewhat hypodense which may be related to underlying anemia. Hepatobiliary: No focal liver abnormality is seen. Status post cholecystectomy. No biliary dilatation. Pancreas: Unremarkable. No pancreatic ductal dilatation or surrounding inflammatory changes. Spleen: Normal in size without focal abnormality. Adrenals/Urinary Tract: Adrenal glands are within normal limits. Kidneys are well visualized bilaterally without renal calculi or obstructive changes. Left renal cyst is noted. The bladder is decompressed. Stomach/Bowel: There are inflammatory changes at the junction of the sigmoid and rectum with associated diverticular change and wall thickening. This is consistent with acute diverticulitis. Scattered mild inflammatory change in the more proximal sigmoid colon is seen. The appendix has been surgically removed. No other obstructive or inflammatory changes are noted. No abscess or perforation is seen.  Vascular/Lymphatic: Aortic atherosclerosis. No enlarged abdominal or pelvic lymph nodes. Reproductive: Uterus has been surgically removed. No adnexal mass is seen. Other: No abdominal wall hernia or abnormality. No abdominopelvic ascites. Musculoskeletal: Left hip replacement is noted. There is a chronic appearing right hip joint effusion. Degenerative changes of the lumbar spine are noted. No compression deformity is seen. IMPRESSION: Changes consistent with acute diverticulitis at the junction of the sigmoid and rectum with some more mild diverticular inflammatory change in the more proximal sigmoid colon. No abscess or perforation is noted at this time. Chronic right hip joint effusion. No other acute abnormality is noted. Electronically Signed   By: Inez Catalina M.D.   On: 04/23/2018 12:42   Dg Chest 2 View  Result Date: 04/26/2018 CLINICAL DATA:  Shortness of breath EXAM: CHEST - 2 VIEW COMPARISON:  None FINDINGS: The lungs are hyperinflated likely secondary to COPD. There is hazy left perihilar airspace disease which may reflect atelectasis versus pneumonia. There is no pleural effusion or pneumothorax. There is stable cardiomegaly. The osseous structures are  unremarkable. IMPRESSION: Hazy left perihilar airspace disease which may reflect atelectasis versus pneumonia. Electronically Signed   By: Kathreen Devoid   On: 04/26/2018 11:47   Dg Esophagus  Result Date: 04/29/2018 CLINICAL DATA:  Dysphagia, episode difficulty breathing wheezing yesterday EXAM: ESOPHOGRAM/BARIUM SWALLOW TECHNIQUE: Single contrast examination was performed using thin barium. Patient also swallowed a 12.5 mm diameter barium tablet. FLUOROSCOPY TIME:  Fluoroscopy Time:  1 minutes 54 seconds Radiation Exposure Index (if provided by the fluoroscopic device): 35.3 mGy Number of Acquired Spot Images: multiple fluoroscopic screen captures COMPARISON:  None FINDINGS: Examination performed with patient supine and RPO with head of bed  elevated 20 degrees. Grossly normal esophageal distention. No definite soft esophageal mass or stricture. Targeted rapid sequence imaging of the cervical esophagus and hypopharynx showed no laryngeal penetration or aspiration. No significant residuals. Significant diffuse impairment of esophageal motility with poor clearance of contrast from the thoracic esophagus with the patient elevated 20 degrees. Esophageal walls appear smooth without irregularity. 12.5 mm diameter barium tablet passed from oral cavity to the junction of the middle and distal thirds of the thoracic esophagus, did not pass more distally due primarily to severe motility issues in patient supine with head of bed elevated 20 degrees since; unable to assess diameter of the distal third of the thoracic esophagus with the barium tablet. IMPRESSION: Significant age-related esophageal dysmotility. No esophageal mass or stricture grossly identified. Electronically Signed   By: Lavonia Dana M.D.   On: 04/29/2018 15:36   US Venous Img Lower Bilateral  Result Date: 04/28/2018 CLINICAL DATA:  Pain and edema EXAM: BILATERAL LOWER EXTREMITY VENOUS DOPPLER ULTRASOUND TECHNIQUE: Gray-scale sonography with compression, as well as color and duplex ultrasound, were performed to evaluate the deep venous system from the level of the common femoral vein through the popliteal and proximal calf veins. COMPARISON:  None FINDINGS: Normal compressibility of the common femoral, superficial femoral, and popliteal veins, as well as the proximal calf veins. No filling defects to suggest DVT on grayscale or color Doppler imaging. Doppler waveforms show normal direction of venous flow, normal respiratory phasicity and response to augmentation. 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 The left great saphenous vein is dilated distally, compressible. IMPRESSION: No evidence of  lower extremity deep vein thrombosis. Electronically Signed   By: Lucrezia Europe M.D.   On:  04/28/2018 14:33   Dg Chest Port 1 View  Result Date: 04/28/2018 CLINICAL DATA:  Respiratory distress EXAM: PORTABLE CHEST 1 VIEW COMPARISON:  April 26, 2018 FINDINGS: The mediastinal contour stable heart size is enlarged. The aorta is tortuous. There are increased pulmonary interstitium bilaterally. There is no focal pneumonia or pleural effusion. The visualized skeletal structures are stable. IMPRESSION: Mild congestive heart failure. Electronically Signed   By: Abelardo Diesel M.D.   On: 04/28/2018 13:20    Orson Eva, DO  [facility] Pager [phone]  If 7PM-7AM, please contact night-coverage [email or web address] Password [email or web address] 04/30/2018, 8:30 AM   LOS: 4 days

## 2018-04-30 NOTE — Progress Notes (Signed)
Physical Therapy Treatment Patient Details Name: TACOYA ALTIZER MRN: [id number] DOB: 1923-01-02 Today's Date: 04/30/2018    History of Present Illness Pearly P Emigh is a 84 y.o. female with a past medical history significant for systolic heart failure, chronic kidney disease stage IV, chronic atrial fibrillation (not on anticoagulation due to prior GI bleed), hypertension, gastroesophageal reflux disease, and recent diagnosis of diverticulitis; who presented to the emergency department secondary to increased shortness of breath, lower extremity edema, orthopnea and per family members some confusion while attempting to use prescribed antibiotics for diverticulitis.  Patient was seen in the emergency department on 04/23/18 due to nausea and left lower quadrant pain; Diagnosis of acute diverticulitis was made and the patient discharged home on Flagyl and full dose ciprofloxacin.  Family reported by Saturday night patient was experiencing limits of confusion around using antibiotics and they stop given to her.  They also noticed increased swelling in her lower extremities and shortness of breath.  They follow-up with her outpatient PCP and during that visit she was not complaining of any nausea, vomiting or abdominal pain.  Mentation appears to be okay while off antibiotics.  PCP noticed increased rales SOB and LE edema. Patient sent to ED for further evaluation with concerns of CHF exacerbation.  Patient expressed difficulty laying down flat, shortness of breath at rest and with minimal activity and Worsening lower extremity edema.    PT Comments    REASSESSMENT:   Patient demonstrates slow labored movement for sitting up/scooting to bedside due to c/o bilateral hip pain, limited to a few steps at bedside due to c/o fatigue and mild SOB while on 2 LPM O2, able to transfer to [facility] to urinate and tolerated sitting up in chair after therapy.  Plan:  Patient will benefit from continued physical therapy in  hospital and recommended venue below to increase strength, balance, endurance for safe ADLs and gait.   Follow Up Recommendations  SNF;Supervision/Assistance - 24 hour     Equipment Recommendations  None recommended by PT    Recommendations for Other Services       Precautions / Restrictions Precautions Precautions: Fall Restrictions Weight Bearing Restrictions: No    Mobility  Bed Mobility   Bed Mobility: Supine to Sit     Supine to sit: Mod assist     General bed mobility comments: slow labored movement  Transfers Overall transfer level: Needs assistance Equipment used: Rolling walker (2 wheeled) Transfers: Sit to/from [facility] Sit to Stand: Min assist Stand pivot transfers: Min assist       General transfer comment: slow labored movement  Ambulation/Gait Ambulation/Gait assistance: Mod assist Gait Distance (Feet): 5 Feet Assistive device: Rolling walker (2 wheeled) Gait Pattern/deviations: Decreased step length - right;Decreased step length - left;Decreased stride length Gait velocity: slow   General Gait Details: limited to 7-8 slow labored steps at bedside during transfer to [facility] and chair, mostly limited due to c/o fatigue and mild SOB   Stairs             Wheelchair Mobility    Modified Rankin (Stroke Patients Only)       Balance Overall balance assessment: Needs assistance Sitting-balance support: Feet supported;No upper extremity supported Sitting balance-Leahy Scale: Good     Standing balance support: Bilateral upper extremity supported;During functional activity Standing balance-Leahy Scale: Fair Standing balance comment: using RW  Cognition Arousal/Alertness: Awake/alert Behavior During Therapy: WFL for tasks assessed/performed Overall Cognitive Status: Within Functional Limits for tasks assessed                                        Exercises General  Exercises - Lower Extremity Toe Raises: Seated;Strengthening;AROM;Both;10 reps Heel Raises: Seated;AROM;Strengthening;Both;10 reps    General Comments        Pertinent Vitals/Pain Pain Assessment: Faces Faces Pain Scale: Hurts little more Pain Location: bilateral hips Pain Descriptors / Indicators: Sore;Aching;Discomfort Pain Intervention(s): Limited activity within patient's tolerance;Monitored during session    Home Living                      Prior Function            PT Goals (current goals can now be found in the care plan section) Acute Rehab PT Goals Patient Stated Goal: return home PT Goal Formulation: With patient/family Time For Goal Achievement: 05/11/18 Potential to Achieve Goals: Good Progress towards PT goals: Progressing toward goals    Frequency    Min 3X/week      PT Plan Current plan remains appropriate    Co-evaluation              AM-PAC PT "6 Clicks" Daily Activity  Outcome Measure  Difficulty turning over in bed (including adjusting bedclothes, sheets and blankets)?: A Little Difficulty moving from lying on back to sitting on the side of the bed? : A Lot Difficulty sitting down on and standing up from a chair with arms (e.g., wheelchair, bedside commode, etc,.)?: A Little Help needed moving to and from a bed to chair (including a wheelchair)?: A Little Help needed walking in hospital room?: A Little Help needed climbing 3-5 steps with a railing? : A Lot 6 Click Score: 16    End of Session Equipment Utilized During Treatment: Oxygen Activity Tolerance: Patient tolerated treatment well;Patient limited by fatigue Patient left: in chair;with call bell/phone within reach Nurse Communication: Other (comment);Mobility status(RN notified that patient left sitting up at bedside) PT Visit Diagnosis: Unsteadiness on feet (R26.81);Other abnormalities of gait and mobility (R26.89);Muscle weakness (generalized) (M62.81)     Time:  3149-7026 PT Time Calculation (min) (ACUTE ONLY): 27 min  Charges:  $Therapeutic Activity: 23-37 mins                     2:13 PM, 04/30/18 Lonell Grandchild, MPT Physical Therapist with [facility] [phone] office [phone] mobile phone

## 2018-05-01 ENCOUNTER — Inpatient Hospital Stay
Admission: RE | Admit: 2018-05-01 | Discharge: 2018-05-29 | Disposition: A | Discharge status: Home / Self Care | Payer: Medicare Other | Source: Ambulatory Visit | Attending: Internal Medicine | Admitting: Internal Medicine

## 2018-05-01 ENCOUNTER — Encounter (HOSPITAL_COMMUNITY): Payer: Self-pay | Admitting: Gastroenterology

## 2018-05-01 DIAGNOSIS — D508 Other iron deficiency anemias: Secondary | ICD-10-CM | POA: Diagnosis not present

## 2018-05-01 DIAGNOSIS — B351 Tinea unguium: Secondary | ICD-10-CM | POA: Diagnosis not present

## 2018-05-01 DIAGNOSIS — R2689 Other abnormalities of gait and mobility: Secondary | ICD-10-CM | POA: Diagnosis not present

## 2018-05-01 DIAGNOSIS — M6281 Muscle weakness (generalized): Secondary | ICD-10-CM | POA: Diagnosis not present

## 2018-05-01 DIAGNOSIS — I739 Peripheral vascular disease, unspecified: Secondary | ICD-10-CM | POA: Diagnosis not present

## 2018-05-01 DIAGNOSIS — R600 Localized edema: Secondary | ICD-10-CM | POA: Diagnosis not present

## 2018-05-01 DIAGNOSIS — L603 Nail dystrophy: Secondary | ICD-10-CM | POA: Diagnosis not present

## 2018-05-01 DIAGNOSIS — I872 Venous insufficiency (chronic) (peripheral): Secondary | ICD-10-CM | POA: Diagnosis not present

## 2018-05-01 DIAGNOSIS — Z9981 Dependence on supplemental oxygen: Secondary | ICD-10-CM | POA: Diagnosis not present

## 2018-05-01 DIAGNOSIS — E876 Hypokalemia: Secondary | ICD-10-CM | POA: Diagnosis not present

## 2018-05-01 DIAGNOSIS — D649 Anemia, unspecified: Secondary | ICD-10-CM | POA: Diagnosis not present

## 2018-05-01 DIAGNOSIS — I259 Chronic ischemic heart disease, unspecified: Secondary | ICD-10-CM | POA: Diagnosis not present

## 2018-05-01 DIAGNOSIS — I4891 Unspecified atrial fibrillation: Secondary | ICD-10-CM | POA: Diagnosis not present

## 2018-05-01 DIAGNOSIS — R195 Other fecal abnormalities: Secondary | ICD-10-CM | POA: Diagnosis not present

## 2018-05-01 DIAGNOSIS — I130 Hypertensive heart and chronic kidney disease with heart failure and stage 1 through stage 4 chronic kidney disease, or unspecified chronic kidney disease: Secondary | ICD-10-CM | POA: Diagnosis not present

## 2018-05-01 DIAGNOSIS — R262 Difficulty in walking, not elsewhere classified: Secondary | ICD-10-CM | POA: Diagnosis not present

## 2018-05-01 DIAGNOSIS — J690 Pneumonitis due to inhalation of food and vomit: Secondary | ICD-10-CM | POA: Diagnosis not present

## 2018-05-01 DIAGNOSIS — I10 Essential (primary) hypertension: Secondary | ICD-10-CM | POA: Diagnosis not present

## 2018-05-01 DIAGNOSIS — K219 Gastro-esophageal reflux disease without esophagitis: Secondary | ICD-10-CM | POA: Diagnosis not present

## 2018-05-01 DIAGNOSIS — I071 Rheumatic tricuspid insufficiency: Secondary | ICD-10-CM | POA: Diagnosis not present

## 2018-05-01 DIAGNOSIS — Z23 Encounter for immunization: Secondary | ICD-10-CM | POA: Diagnosis not present

## 2018-05-01 DIAGNOSIS — I482 Chronic atrial fibrillation, unspecified: Secondary | ICD-10-CM | POA: Diagnosis not present

## 2018-05-01 DIAGNOSIS — I712 Thoracic aortic aneurysm, without rupture: Secondary | ICD-10-CM | POA: Diagnosis not present

## 2018-05-01 DIAGNOSIS — M159 Polyosteoarthritis, unspecified: Secondary | ICD-10-CM | POA: Diagnosis not present

## 2018-05-01 DIAGNOSIS — M79675 Pain in left toe(s): Secondary | ICD-10-CM | POA: Diagnosis not present

## 2018-05-01 DIAGNOSIS — N184 Chronic kidney disease, stage 4 (severe): Secondary | ICD-10-CM | POA: Diagnosis not present

## 2018-05-01 DIAGNOSIS — K5732 Diverticulitis of large intestine without perforation or abscess without bleeding: Secondary | ICD-10-CM | POA: Diagnosis not present

## 2018-05-01 DIAGNOSIS — R1312 Dysphagia, oropharyngeal phase: Secondary | ICD-10-CM | POA: Diagnosis not present

## 2018-05-01 DIAGNOSIS — J9601 Acute respiratory failure with hypoxia: Secondary | ICD-10-CM | POA: Diagnosis not present

## 2018-05-01 DIAGNOSIS — H8110 Benign paroxysmal vertigo, unspecified ear: Secondary | ICD-10-CM | POA: Diagnosis not present

## 2018-05-01 DIAGNOSIS — M79674 Pain in right toe(s): Secondary | ICD-10-CM | POA: Diagnosis not present

## 2018-05-01 DIAGNOSIS — I5033 Acute on chronic diastolic (congestive) heart failure: Secondary | ICD-10-CM | POA: Diagnosis not present

## 2018-05-01 DIAGNOSIS — R1310 Dysphagia, unspecified: Secondary | ICD-10-CM | POA: Diagnosis not present

## 2018-05-01 DIAGNOSIS — K5792 Diverticulitis of intestine, part unspecified, without perforation or abscess without bleeding: Secondary | ICD-10-CM | POA: Diagnosis not present

## 2018-05-01 DIAGNOSIS — I5023 Acute on chronic systolic (congestive) heart failure: Secondary | ICD-10-CM | POA: Diagnosis not present

## 2018-05-01 DIAGNOSIS — K224 Dyskinesia of esophagus: Secondary | ICD-10-CM | POA: Diagnosis not present

## 2018-05-01 DIAGNOSIS — L600 Ingrowing nail: Secondary | ICD-10-CM | POA: Diagnosis not present

## 2018-05-01 DIAGNOSIS — H40059 Ocular hypertension, unspecified eye: Secondary | ICD-10-CM | POA: Diagnosis not present

## 2018-05-01 DIAGNOSIS — G8929 Other chronic pain: Secondary | ICD-10-CM | POA: Diagnosis not present

## 2018-05-01 LAB — BASIC METABOLIC PANEL WITH GFR
Anion gap: 10 (ref 5–15)
BUN: 31 mg/dL — ABNORMAL HIGH (ref 8–23)
CO2: 31 mmol/L (ref 22–32)
Calcium: 7.9 mg/dL — ABNORMAL LOW (ref 8.9–10.3)
Chloride: 103 mmol/L (ref 98–111)
Creatinine, Ser: 1.2 mg/dL — ABNORMAL HIGH (ref 0.44–1.00)
GFR calc Af Amer: 44 mL/min — ABNORMAL LOW
GFR calc non Af Amer: 38 mL/min — ABNORMAL LOW
Glucose, Bld: 113 mg/dL — ABNORMAL HIGH (ref 70–99)
Potassium: 3.3 mmol/L — ABNORMAL LOW (ref 3.5–5.1)
Sodium: 144 mmol/L (ref 135–145)

## 2018-05-01 MED ORDER — TRAMADOL-ACETAMINOPHEN 37.5-325 MG PO TABS: 1.0000 | ORAL_TABLET | ORAL | 0 refills | Status: DC | PRN

## 2018-05-01 MED ORDER — POTASSIUM CHLORIDE 20 MEQ/15ML (10%) PO SOLN
20.0000 meq | Freq: Once | ORAL | Status: AC
  Administered 2018-05-01: 20 meq via ORAL
  Filled 2018-05-01: qty 30

## 2018-05-01 MED ORDER — LEVOFLOXACIN 250 MG PO TABS: 250.0000 mg | ORAL_TABLET | Freq: Every day | ORAL | 0 refills | Status: DC

## 2018-05-01 MED ORDER — PREDNISONE 50 MG PO TABS: 50.0000 mg | ORAL_TABLET | Freq: Every day | ORAL | 0 refills | Status: DC

## 2018-05-01 MED ORDER — METRONIDAZOLE 500 MG PO TABS: 500.0000 mg | ORAL_TABLET | Freq: Three times a day (TID) | ORAL | 0 refills | Status: DC

## 2018-05-01 MED ORDER — IPRATROPIUM-ALBUTEROL 0.5-2.5 (3) MG/3ML IN SOLN: 3.0000 mL | Freq: Four times a day (QID) | RESPIRATORY_TRACT | 0 refills | Status: DC | PRN

## 2018-05-01 NOTE — Progress Notes (Signed)
Patient will Discharge To: [facility] Anticipated DC Date:05/01/18 Family Notified: Left voice messages for Sherre Scarlet [phone] Transport By: RN    Per MD patient ready for DC to [facility] . RN, patient, patient's family, and facility notified of DC. Assessment, Fl2/Pasrr, and Discharge Summary sent to facility. RN given number for report [phone] ask for [place], Room # 153). DC packet on chart. Ambulance transport requested for patient.   CSW signing off.  Reed Breech LCSWA [phone]

## 2018-05-01 NOTE — Progress Notes (Signed)
Report called to [place] at [facility].  AVS sent with pt.  Family accompanied pt to [facility]. Pt transferred to [facility] via private wheelchair in stable condition. PIV removed intact w/o S&S of complications.

## 2018-05-01 NOTE — Plan of Care (Signed)
Adequate for discharge.

## 2018-05-03 ENCOUNTER — Non-Acute Institutional Stay (SKILLED_NURSING_FACILITY): Payer: Medicare Other | Admitting: Internal Medicine

## 2018-05-03 ENCOUNTER — Encounter: Payer: Self-pay | Admitting: Internal Medicine

## 2018-05-03 ENCOUNTER — Encounter (HOSPITAL_COMMUNITY)
Admission: RE | Admit: 2018-05-03 | Discharge: 2018-05-03 | Disposition: A | Discharge status: Home / Self Care | Payer: Medicare Other | Source: Skilled Nursing Facility | Attending: Internal Medicine | Admitting: Internal Medicine

## 2018-05-03 DIAGNOSIS — I482 Chronic atrial fibrillation, unspecified: Secondary | ICD-10-CM

## 2018-05-03 DIAGNOSIS — E876 Hypokalemia: Secondary | ICD-10-CM | POA: Diagnosis not present

## 2018-05-03 DIAGNOSIS — R1312 Dysphagia, oropharyngeal phase: Secondary | ICD-10-CM | POA: Diagnosis not present

## 2018-05-03 DIAGNOSIS — I5023 Acute on chronic systolic (congestive) heart failure: Secondary | ICD-10-CM | POA: Diagnosis not present

## 2018-05-03 DIAGNOSIS — K5792 Diverticulitis of intestine, part unspecified, without perforation or abscess without bleeding: Secondary | ICD-10-CM | POA: Diagnosis not present

## 2018-05-03 DIAGNOSIS — D649 Anemia, unspecified: Secondary | ICD-10-CM

## 2018-05-03 DIAGNOSIS — J690 Pneumonitis due to inhalation of food and vomit: Secondary | ICD-10-CM | POA: Diagnosis not present

## 2018-05-03 DIAGNOSIS — N184 Chronic kidney disease, stage 4 (severe): Secondary | ICD-10-CM

## 2018-05-03 LAB — BASIC METABOLIC PANEL
Anion gap: 9 (ref 5–15)
BUN: 28 mg/dL — ABNORMAL HIGH (ref 8–23)
CHLORIDE: 103 mmol/L (ref 98–111)
CO2: 32 mmol/L (ref 22–32)
Calcium: 7.7 mg/dL — ABNORMAL LOW (ref 8.9–10.3)
Creatinine, Ser: 1.24 mg/dL — ABNORMAL HIGH (ref 0.44–1.00)
GFR calc Af Amer: 42 mL/min — ABNORMAL LOW (ref >60)
GFR calc non Af Amer: 36 mL/min — ABNORMAL LOW (ref >60)
Glucose, Bld: 90 mg/dL (ref 70–99)
POTASSIUM: 3.2 mmol/L — AB (ref 3.5–5.1)
SODIUM: 144 mmol/L (ref 135–145)

## 2018-05-03 LAB — CBC WITH DIFFERENTIAL/PLATELET
BASOS PCT: 1 %
Basophils Absolute: 0.1 10*3/uL (ref 0.0–0.1)
EOS ABS: 0.1 10*3/uL (ref 0.0–0.7)
Eosinophils Relative: 2 %
HCT: 33.5 % — ABNORMAL LOW (ref 36.0–46.0)
Hemoglobin: 9.9 g/dL — ABNORMAL LOW (ref 12.0–15.0)
Lymphocytes Relative: 18 %
Lymphs Abs: 1.4 10*3/uL (ref 0.7–4.0)
MCH: 28.2 pg (ref 26.0–34.0)
MCHC: 29.6 g/dL — AB (ref 30.0–36.0)
MCV: 95.4 fL (ref 78.0–100.0)
Monocytes Absolute: 0.8 10*3/uL (ref 0.1–1.0)
Monocytes Relative: 10 %
NEUTROS PCT: 69 %
Neutro Abs: 5.4 10*3/uL (ref 1.7–7.7)
Platelets: 261 10*3/uL (ref 150–400)
RBC: 3.51 MIL/uL — AB (ref 3.87–5.11)
RDW: 15.2 % (ref 11.5–15.5)
WBC: 7.8 10*3/uL (ref 4.0–10.5)

## 2018-05-03 NOTE — Progress Notes (Signed)
Provider: Veleta Miners MD  Location:    [facility] Room Number: 153/P Place of Service:  SNF (31)  PCP: [name], MD Patient Care Team: [name], MD as PCP - General (Family Medicine) Herminio Commons, MD as PCP - Cardiology (Cardiology) Gala Romney Cristopher Estimable, MD (Gastroenterology) Felicie Morn, MD (Inactive) as Consulting Physician (General Surgery)  Extended Emergency Contact Information Primary Emergency Contact: Stanfield,Teresa Address: [address], [place] [address] [place] Phone: [phone] Work Phone: [phone] Mobile Phone: [phone] Relation: Daughter Secondary Emergency Contact: Paschal,Frances Address: [address]          [place],  [address] [place] Phone: [phone] Mobile Phone: [phone] Relation: Daughter  Code Status: DNR Goals of Care: Advanced Directive information Advanced Directives 05/03/2018  Does Patient Have a Medical Advance Directive? Yes  Type of Advance Directive Out of facility DNR (pink MOST or yellow form)  Does patient want to make changes to medical advance directive? No - Patient declined  Copy of [place] in Chart? No - copy requested  Would patient like information on creating a medical advance directive? -  Pre-existing out of facility DNR order (yellow form or pink MOST form) -      Chief Complaint  Patient presents with  . New Admit To SNF    New Admission Visit    HPI: Patient is a 84 y.o. female seen today for admission to SNF for therapy after Staying in the hospital from 09/23-09 /28 For  Acute CHF and Aspiration Pneumonitis.  Patient has a history of  Systolic CHF, CKD stage IV, chronic atrial fibrillation not on any anticoagulation secondary to GI bleed, hypertension, and GERD.  She was initially seen in ED on 09 /20 with lower abdominal pain.  CT of the abdomen and pelvis showed  acute diverticulitis of sigmoid colon.  Patient was sent home on Cipro and Flagyl.  Patient became increasingly confused and  family stopped her antibiotics. She was seen by her PCP on 9/23 and was found to be short of breath.   She was admitted with CHF . She was treated with IV Diuretics . She also was treated for Aspiration Pneumonitis with ABX and Steroids. Patient is now in SNF for therapy. Her Only Complain is Right Hip pain which seems chronic. She also is On nectar thick Diet by Speech for her aspiration and dysphagia and she does not like that at all. She wants to have have regular diet. She denied any Cough or Chest pain. Did c/o Some SOB. She lives by herself but has very involved Daughters who check in her Daily. She was walking with Walker at home and was independent in her ADL.   Past Medical History:  Diagnosis Date  . Aortic insufficiency   . Ascending aortic aneurysm ([facility])    a. 4.6cm by CT 2018 - pt deferred any further evaluation/invasive management therefore follow-up deferred.  . Bladder infection    h/o  . CHF (congestive heart failure) ([facility])   . Chronic atrial fibrillation ([facility])    a. not on anticoagulation due to prior GIB  . CKD (chronic kidney disease), stage III ([facility])   . Coronary artery disease    a. mild nonobstructive by cath 2018.  Marland Kitchen DCIS (ductal carcinoma in situ) of breast 03/14/2013   right breast  . Dehydration    HISTORY   .  Fall at home 09/10/12  . Hemorrhoids   . Hip fracture ([facility])    hip surgery 2001  . History of blood clots    in leg  . History of knee surgery   . Hypertension   . Iron deficiency anemia, unspecified 03/14/2013   secondary to GI blood loss  . Kidney infection    h/o  . Melanoma of skin ([facility]) 03/14/2013  . Melanoma of thigh ([facility])    left  . Mini stroke ([facility])   . Mitral regurgitation   . Nonischemic cardiomyopathy ([facility])    a. EF 35-40% by echo in 03/2017 with cath showing nonobstructive CAD, suspected stress-induced.  .  S/P colonoscopy March 2010   RMR: friable anal canal hemorrhoids, hyperplastic ascending polyp, adenomatous descending polyp   . Stomach ulcer    secondary to h.pylori, s/p treatment  . Thyroid condition   . Tricuspid regurgitation   . Venous stasis    edema   Past Surgical History:  Procedure Laterality Date  . APPENDECTOMY  1942  . BREAST LUMPECTOMY  1998  . CARDIAC CATHETERIZATION    . CATARACT EXTRACTION, BILATERAL    . CHOLECYSTECTOMY    . COLONOSCOPY  06/05/11   pancolonic diverticulosis/ileal reosion/abnormal anorectal junction s/p biopsy: path for small intestine and Ti was benign with non-villous atrophy, rectal biopsy with prominent prolapse changes, no acute inflammation  . CORNEAL TRANSPLANT Bilateral 2013  . ENTEROSCOPY N/A 06/13/2013   YTK:PTWS Gastritis/GI BLEED MOST LIKELY DUE TO DUODENAL ULCERS, AND ? AVMs  . ESOPHAGOGASTRODUODENOSCOPY  06/05/11   small hiatal hernia; + H.PYLORI GASTRITIS, s/p 5 days of Pylera, unable to finish due to N/V  . ESOPHAGOGASTRODUODENOSCOPY N/A 06/13/2013   Dr. Oneida Alar- see enteroscopy  . ESOPHAGOGASTRODUODENOSCOPY (EGD) WITH ESOPHAGEAL DILATION N/A 06/08/2013   FKC:LEXNT hiatal hernia;  otherwise normal EGD s/p dilation  . GIVENS CAPSULE STUDY N/A 06/08/2013   Procedure: GIVENS CAPSULE STUDY;  Surgeon: Daneil Dolin, MD;  Location[facility];  Service: Endoscopy;  Laterality: N/A;  . HEMORRHOID BANDING    . KNEE SURGERY Right 05/2006   total right  . LEFT HEART CATH AND CORONARY ANGIOGRAPHY N/A 03/18/2017   Procedure: LEFT HEART CATH AND CORONARY ANGIOGRAPHY;  Surgeon: Wellington Hampshire, MD;  Location: [facility];  Service: Cardiovascular;  Laterality: N/A;  . LEG SURGERY    . MELANOMA EXCISION Left 08/2006   left leg excision  . NM MYOCAR PERF WALL MOTION  03/27/2010   dipyridamole; small reversible basal to mid-septal defect (?artifact), post-stress EF 55%, low risk scan   . PARTIAL HYSTERECTOMY  1976  . TONSILLECTOMY    .  TOTAL HIP ARTHROPLASTY Left 2001&2004    X 2 FOR LEFT HIP  . TRANSTHORACIC ECHOCARDIOGRAM  10/06/2012   EF 55-60%, mod eccentric hypertrophy, grade 2 diastolic dysfunction; mildly calcifed AV annulus with moderate regurg; aortic root mildly dilated; LA severely dailted; PA peak pressure 64mHg  . VARICOSE VEIN SURGERY      reports that she quit smoking about 42 years ago. Her smoking use included cigarettes. She has a 30.00 pack-year smoking history. She has never used smokeless tobacco. She reports that she does not drink alcohol or use drugs. Social History   Socioeconomic History  . Marital status: Widowed    Spouse name: Not on file  . Number of children: 5  . Years of education: 6  . Highest education level: Not on file  Occupational History    Employer: RETIRED  Social Needs  . Financial resource strain: Not on file  . Food insecurity:    Worry: Not on file    Inability: Not on file  . Transportation needs:    Medical: Not on file    Non-medical: Not on file  Tobacco Use  . Smoking status: Former Smoker    Packs/day: 1.50    Years: 20.00    Pack years: 30.00    Types: Cigarettes    Last attempt to quit: 07/05/1975    Years since quitting: 42.8  . Smokeless tobacco: Never Used  . Tobacco comment: Quit smoking in 1975  Substance and Sexual Activity  . Alcohol use: No  . Drug use: No  . Sexual activity: Never  Lifestyle  . Physical activity:    Days per week: Not on file    Minutes per session: Not on file  . Stress: Not on file  Relationships  . Social connections:    Talks on phone: Not on file    Gets together: Not on file    Attends religious service: Not on file    Active member of club or organization: Not on file    Attends meetings of clubs or organizations: Not on file    Relationship status: Not on file  . Intimate partner violence:    Fear of current or ex partner: Not on file    Emotionally abused: Not on file    Physically abused: Not on file     Forced sexual activity: Not on file  Other Topics Concern  . Not on file  Social History Narrative  . Not on file    Functional Status Survey:    Family History  Problem Relation Age of Onset  . Breast cancer Daughter        also hyperlipidemia  . Breast cancer Daughter        also hyperlipidemia  . Asthma Mother   . Multiple sclerosis Child   . Hyperlipidemia Child   . Colon cancer Neg Hx     Health Maintenance  Topic Date Due  . TETANUS/TDAP  06/02/2018 (Originally 08/28/1941)  . INFLUENZA VACCINE  Completed  . DEXA SCAN  Completed  . PNA vac Low Risk Adult  Completed    Allergies  Allergen Reactions  . Codeine Diarrhea and Nausea Only  . Tape Other (See Comments)    SKIN IS VERY THIN AND TEARS AND BRUISES EASILY; PLEASE USE COBAN WRAP!!  . Diona Fanti [Aspirin] Rash and Other (See Comments)    Petechia   . Iron Nausea And Vomiting    Oral iron causes nausea and vomiting  . Penicillins Rash    Has patient had a PCN reaction causing immediate rash, facial/tongue/throat swelling, SOB or lightheadedness with hypotension: Yes Has patient had a PCN reaction causing severe rash involving mucus membranes or skin necrosis: Unknown Has patient had a PCN reaction that required hospitalization: No Has patient had a PCN reaction occurring within the last 10 years: No If all of the above answers are "NO", then may proceed with Cephalosporin use.     Outpatient Encounter Medications as of 05/03/2018  Medication Sig  . carvedilol (COREG) 3.125 MG tablet TAKE 1 TABLET BY MOUTH TWO  TIMES DAILY WITH MEALS  . clopidogrel (PLAVIX) 75 MG tablet Take 1 tablet (75 mg total) by mouth daily with breakfast.  . cyanocobalamin ([facility] VITAMIN B12) 2000 MCG tablet Take 1 tablet (2,000 mcg total) by mouth daily.  Marland Kitchen docusate sodium (COLACE) 100  MG capsule Take 200 mg by mouth 2 (two) times daily.  . folic acid (FOLVITE) 1 MG tablet TAKE 1 TABLET BY MOUTH EVERY DAY  . furosemide (LASIX) 20 MG tablet  04/09/18 dose decreased to 40 mg am and 20 mg pm  . gabapentin (NEURONTIN) 100 MG capsule TAKE 2 CAPSULES BY MOUTH 3  TIMES DAILY  . hydrocortisone (ANUSOL-HC) 25 MG suppository Place 1 suppository (25 mg total) rectally every 12 (twelve) hours.  Marland Kitchen ipratropium-albuterol (DUONEB) 0.5-2.5 (3) MG/3ML SOLN Take 3 mLs by nebulization every 6 (six) hours as needed (sob, wheeze).  Marland Kitchen levofloxacin (LEVAQUIN) 250 MG tablet Take 1 tablet (250 mg total) by mouth daily.  . metroNIDAZOLE (FLAGYL) 500 MG tablet Take 1 tablet (500 mg total) by mouth 3 (three) times daily. X 5 days  . mometasone (ELOCON) 0.1 % cream Apply 1 application topically daily.  . ondansetron (ZOFRAN) 4 MG tablet TAKE 1 TABLET BY MOUTH  EVERY 8 HOURS AS NEEDED FOR NAUSEA AND VOMITING  . pantoprazole (PROTONIX) 40 MG tablet Take 1 tablet (40 mg total) by mouth 2 (two) times daily.  . potassium chloride SA (K-DUR,KLOR-CON) 20 MEQ tablet TAKE 1 TABLET BY MOUTH  DAILY  . prednisoLONE acetate (PRED FORTE) 1 % ophthalmic suspension Place 1 drop into both eyes 4 (four) times daily.   . predniSONE (DELTASONE) 50 MG tablet Take 1 tablet (50 mg total) by mouth daily with breakfast. X 3 days  . sodium chloride (MURO 128) 2 % ophthalmic solution Place 1 drop into both eyes 3 (three) times daily.   . traMADol-acetaminophen (ULTRACET) 37.5-325 MG tablet Take 1 tablet by mouth every 4 (four) hours as needed. for pain   No facility-administered encounter medications on file as of 05/03/2018.      Review of Systems  Constitutional: Positive for activity change and appetite change.  HENT: Negative.   Respiratory: Positive for cough and shortness of breath.   Cardiovascular: Negative.   Gastrointestinal: Negative.   Genitourinary: Negative.   Musculoskeletal: Positive for arthralgias.  Skin: Negative.   Neurological: Negative.   Psychiatric/Behavioral: Negative.     There were no vitals filed for this visit. There is no height or weight on file to  calculate BMI. Physical Exam  Constitutional: She appears well-developed and well-nourished.  HENT:  Head: Normocephalic.  Mouth/Throat: Oropharynx is clear and moist.  Eyes: Pupils are equal, round, and reactive to light.  Neck: Neck supple.  Cardiovascular: Normal rate and regular rhythm.  Murmur heard. Pulmonary/Chest: Effort normal and breath sounds normal. No stridor. No respiratory distress. She has no wheezes.  Abdominal: Soft. Bowel sounds are normal. She exhibits no distension. There is no tenderness. There is no guarding.  Musculoskeletal:  Mild edema Bilateral  Neurological: She is alert.  Did not knew the year but knew everything else. Has good strength on all extremities. Little Weak in Right LE  Skin: Skin is warm and dry.  Psychiatric: She has a normal mood and affect. Her behavior is normal. Thought content normal.    Labs reviewed: Basic Metabolic Panel: Recent Labs    04/29/18 0424 04/30/18 0422 05/01/18 0653 05/03/18 0600  NA 138 140 144 144  K 3.7 3.2* 3.3* 3.2*  CL 99 101 103 103  CO2 27 28 31  32  GLUCOSE 105* 101* 113* 90  BUN 25* 31* 31* 28*  CREATININE 1.37* 1.17* 1.20* 1.24*  CALCIUM 7.8* 7.8* 7.9* 7.7*  MG 2.2  --   --   --  Liver Function Tests: Recent Labs    04/23/18 1106 04/26/18 1114 04/28/18 1341  AST 14* 24 27  ALT 9 12 14   ALKPHOS 61 64 71  BILITOT 0.9 0.8 1.0  PROT 6.7 6.7 6.6  ALBUMIN 3.1* 3.1* 3.2*   Recent Labs    04/23/18 1106  LIPASE 24   No results for input(s): AMMONIA in the last 8760 hours. CBC: Recent Labs    04/23/18 1106 04/26/18 1114  04/29/18 0424 04/30/18 0422 05/03/18 0600  WBC 8.0 6.9   < > 5.2 6.0 7.8  NEUTROABS 5.9 5.5  --   --   --  5.4  HGB 8.9* 9.3*   < > 9.8* 9.6* 9.9*  HCT 29.7* 30.8*   < > 31.9* 31.1* 33.5*  MCV 94.6 95.1   < > 91.7 90.9 95.4  PLT 172 217   < > 239 252 261   < > = values in this interval not displayed.   Cardiac Enzymes: Recent Labs    04/27/18 0548  04/28/18 1341 04/28/18 1857  TROPONINI <0.03 <0.03 0.11*   BNP: Invalid input(s): POCBNP Lab Results  Component Value Date   HGBA1C 5.5 03/17/2017   Lab Results  Component Value Date   TSH 3.012 04/26/2018   Lab Results  Component Value Date   VITAMINB12 3,815 (H) 04/28/2018   Lab Results  Component Value Date   FOLATE 49.1 04/28/2018   Lab Results  Component Value Date   IRON 240 (H) 04/28/2018   TIBC 358 04/28/2018   FERRITIN 53 04/28/2018    Imaging and Procedures obtained prior to SNF admission: No results found.  Assessment/Plan Acute on chronic systolic CHF (congestive heart failure) Repeat Echo actually shows improvement in her EF of 60% She is back to her Baseline weight of 160 lbs Will slowly taper her off oxygen Continue Increased dose of Lasix Repeat BMP Continue to follow weights Continue Coreg  Atrial fibrillation, chronic  Rate Control on Coreg Not on any anticoagulation Continue Plavix  Aspiration pneumonia  Patient doing well on Levaquin and Flagyl Also On prednisone Taper.  Oropharyngeal dysphagia Was evaluated by Barium swallow and Bedside eval by Speech. She has Esophageal Immobility due to her Age and also Aspiration Will continue Nectar thick Fluids for now Revaluate By Speech in Facility to upgrade diet  Acute diverticulitis On Levaquin and Flagyl Asymptomatic now Was diagnosed with CT scan on 09/20  Chronic renal failure, stage 4 (severe) Creat at baseline Will follow BMP  Hypokalemia Supplement Potassium  Anemia,  Iron studies and B12 was Normal Hgb Stable Right Hip Pain Seems Chronic Continue Ultram PRN and Neurontin  Family/ staff Communication:   Labs/tests ordered:

## 2018-05-04 ENCOUNTER — Encounter: Payer: Self-pay | Admitting: Family Medicine

## 2018-05-04 ENCOUNTER — Other Ambulatory Visit: Payer: Self-pay

## 2018-05-04 MED ORDER — TRAMADOL-ACETAMINOPHEN 37.5-325 MG PO TABS: 1.0000 | ORAL_TABLET | ORAL | 0 refills | Status: DC | PRN

## 2018-05-04 NOTE — Telephone Encounter (Signed)
RX Fax for [facility]@ [phone]  

## 2018-05-10 ENCOUNTER — Encounter (HOSPITAL_COMMUNITY)
Admission: RE | Admit: 2018-05-10 | Discharge: 2018-05-10 | Disposition: A | Discharge status: Home / Self Care | Payer: Medicare Other | Source: Skilled Nursing Facility | Attending: Internal Medicine | Admitting: Internal Medicine

## 2018-05-10 DIAGNOSIS — I130 Hypertensive heart and chronic kidney disease with heart failure and stage 1 through stage 4 chronic kidney disease, or unspecified chronic kidney disease: Secondary | ICD-10-CM | POA: Insufficient documentation

## 2018-05-10 DIAGNOSIS — I259 Chronic ischemic heart disease, unspecified: Secondary | ICD-10-CM | POA: Insufficient documentation

## 2018-05-10 LAB — CBC
HCT: 32 % — ABNORMAL LOW (ref 36.0–46.0)
Hemoglobin: 9.5 g/dL — ABNORMAL LOW (ref 12.0–15.0)
MCH: 28.2 pg (ref 26.0–34.0)
MCHC: 29.7 g/dL — AB (ref 30.0–36.0)
MCV: 95 fL (ref 78.0–100.0)
PLATELETS: 168 10*3/uL (ref 150–400)
RBC: 3.37 MIL/uL — ABNORMAL LOW (ref 3.87–5.11)
RDW: 16.7 % — ABNORMAL HIGH (ref 11.5–15.5)
WBC: 10.5 10*3/uL (ref 4.0–10.5)

## 2018-05-10 LAB — BASIC METABOLIC PANEL
Anion gap: 6 (ref 5–15)
BUN: 22 mg/dL (ref 8–23)
CO2: 29 mmol/L (ref 22–32)
CREATININE: 1.06 mg/dL — AB (ref 0.44–1.00)
Calcium: 7.4 mg/dL — ABNORMAL LOW (ref 8.9–10.3)
Chloride: 106 mmol/L (ref 98–111)
GFR calc Af Amer: 51 mL/min — ABNORMAL LOW (ref >60)
GFR, EST NON AFRICAN AMERICAN: 44 mL/min — AB (ref >60)
GLUCOSE: 107 mg/dL — AB (ref 70–99)
Potassium: 4.4 mmol/L (ref 3.5–5.1)
SODIUM: 141 mmol/L (ref 135–145)

## 2018-05-14 ENCOUNTER — Other Ambulatory Visit: Payer: Self-pay

## 2018-05-14 ENCOUNTER — Non-Acute Institutional Stay (SKILLED_NURSING_FACILITY): Payer: Medicare Other | Admitting: Internal Medicine

## 2018-05-14 DIAGNOSIS — N184 Chronic kidney disease, stage 4 (severe): Secondary | ICD-10-CM | POA: Diagnosis not present

## 2018-05-14 DIAGNOSIS — I482 Chronic atrial fibrillation, unspecified: Secondary | ICD-10-CM | POA: Diagnosis not present

## 2018-05-14 DIAGNOSIS — I5023 Acute on chronic systolic (congestive) heart failure: Secondary | ICD-10-CM | POA: Diagnosis not present

## 2018-05-14 DIAGNOSIS — R195 Other fecal abnormalities: Secondary | ICD-10-CM | POA: Diagnosis not present

## 2018-05-14 MED ORDER — TRAMADOL-ACETAMINOPHEN 37.5-325 MG PO TABS: 1.0000 | ORAL_TABLET | ORAL | 0 refills | Status: DC | PRN

## 2018-05-14 NOTE — Telephone Encounter (Signed)
RX Fax for [facility]@ [phone]  

## 2018-05-15 ENCOUNTER — Other Ambulatory Visit (HOSPITAL_COMMUNITY)
Admission: AD | Admit: 2018-05-15 | Discharge: 2018-05-15 | Disposition: A | Discharge status: Home / Self Care | Payer: Medicare Other | Source: Skilled Nursing Facility | Attending: Internal Medicine | Admitting: Internal Medicine

## 2018-05-15 DIAGNOSIS — I5023 Acute on chronic systolic (congestive) heart failure: Secondary | ICD-10-CM | POA: Insufficient documentation

## 2018-05-15 DIAGNOSIS — N184 Chronic kidney disease, stage 4 (severe): Secondary | ICD-10-CM | POA: Insufficient documentation

## 2018-05-15 LAB — BASIC METABOLIC PANEL
Anion gap: 8 (ref 5–15)
BUN: 28 mg/dL — AB (ref 8–23)
CO2: 29 mmol/L (ref 22–32)
CREATININE: 1.24 mg/dL — AB (ref 0.44–1.00)
Calcium: 7.8 mg/dL — ABNORMAL LOW (ref 8.9–10.3)
Chloride: 101 mmol/L (ref 98–111)
GFR calc Af Amer: 42 mL/min — ABNORMAL LOW (ref >60)
GFR, EST NON AFRICAN AMERICAN: 36 mL/min — AB (ref >60)
Glucose, Bld: 93 mg/dL (ref 70–99)
Potassium: 5.2 mmol/L — ABNORMAL HIGH (ref 3.5–5.1)
SODIUM: 138 mmol/L (ref 135–145)

## 2018-05-15 LAB — CBC WITH DIFFERENTIAL/PLATELET
ABS IMMATURE GRANULOCYTES: 0.03 10*3/uL (ref 0.00–0.07)
BASOS ABS: 0.1 10*3/uL (ref 0.0–0.1)
Basophils Relative: 1 %
EOS ABS: 0.2 10*3/uL (ref 0.0–0.5)
Eosinophils Relative: 3 %
HEMATOCRIT: 32.4 % — AB (ref 36.0–46.0)
Hemoglobin: 9.3 g/dL — ABNORMAL LOW (ref 12.0–15.0)
Immature Granulocytes: 1 %
LYMPHS PCT: 25 %
Lymphs Abs: 1.5 10*3/uL (ref 0.7–4.0)
MCH: 27.2 pg (ref 26.0–34.0)
MCHC: 28.7 g/dL — ABNORMAL LOW (ref 30.0–36.0)
MCV: 94.7 fL (ref 80.0–100.0)
MONO ABS: 0.5 10*3/uL (ref 0.1–1.0)
Monocytes Relative: 9 %
Neutro Abs: 3.9 10*3/uL (ref 1.7–7.7)
Neutrophils Relative %: 61 %
PLATELETS: 200 10*3/uL (ref 150–400)
RBC: 3.42 MIL/uL — ABNORMAL LOW (ref 3.87–5.11)
RDW: 16 % — AB (ref 11.5–15.5)
WBC: 6.2 10*3/uL (ref 4.0–10.5)
nRBC: 0 % (ref 0.0–0.2)

## 2018-05-16 ENCOUNTER — Encounter: Payer: Self-pay | Admitting: Internal Medicine

## 2018-05-16 NOTE — Progress Notes (Signed)
This is an acute visit.  Level care skilled.  Facility is [facility] complaint-acute visit secondary to possible short episode of rectal bleeding.  History of present illness.  Patient is a very pleasant 84 year old female here for rehab after hospitalization for acute CHF and aspiration pneumonitis.  Patient has a history of  Systolic CHF, CKD stage IV, chronic atrial fibrillation not on any anticoagulation secondary to GI bleed, hypertension, and GERD.    She was initially seen in ED on 09 /20 with lower abdominal pain.  CT of the abdomen and pelvis showed acute diverticulitis of sigmoid colon.  Patient was sent home on Cipro and Flagyl.  Patient became increasingly confused and  family stopped her antibiotics. She was seen by her PCP on 9/23 and was found to be short of breath.   She was admitted with CHF . She was treated with IV Diuretics . She also was treated for Aspiration Pneumonitis with ABX and Steroids   She appears to have done well here.  According to her family she does occasionally have short episodes of rectal bleeding-this may actually be due at times to her hemorrhoids- as well as history of diverticulitis-- family does not desire any aggressive work-up of this--her hemoglobin has been stable  \She does have an order for Anusol.  Her only complaint today is hip and back pain which per patient and daughter is fairly chronic      Past Medical History:  Diagnosis Date  . Aortic insufficiency   . Ascending aortic aneurysm ([place])    a. 4.6cm by CT 2018 - pt deferred any further evaluation/invasive management therefore follow-up deferred.  . Bladder infection    h/o  . CHF (congestive heart failure) ([place])   . Chronic atrial fibrillation ([facility])    a. not on anticoagulation due to prior GIB  . CKD (chronic kidney disease), stage III ([place])   . Coronary artery disease    a. mild nonobstructive by cath 2018.  Marland Kitchen DCIS (ductal carcinoma in situ)  of breast 03/14/2013   right breast  . Dehydration    HISTORY   . Fall at home 09/10/12  . Hemorrhoids   . Hip fracture ([place])    hip surgery 2001  . History of blood clots    in leg  . History of knee surgery   . Hypertension   . Iron deficiency anemia, unspecified 03/14/2013   secondary to GI blood loss  . Kidney infection    h/o  . Melanoma of skin ([place]) 03/14/2013  . Melanoma of thigh ([place])    left  . Mini stroke ([place])   . Mitral regurgitation   . Nonischemic cardiomyopathy ([place])    a. EF 35-40% by echo in 03/2017 with cath showing nonobstructive CAD, suspected stress-induced.  . S/P colonoscopy March 2010   RMR: friable anal canal hemorrhoids, hyperplastic ascending polyp, adenomatous descending polyp   . Stomach ulcer    secondary to h.pylori, s/p treatment  . Thyroid condition   . Tricuspid regurgitation   . Venous stasis    edema        Past Surgical History:  Procedure Laterality Date  . APPENDECTOMY  1942  . BREAST LUMPECTOMY  1998  . CARDIAC CATHETERIZATION    . CATARACT EXTRACTION, BILATERAL    . CHOLECYSTECTOMY    . COLONOSCOPY  06/05/11   pancolonic diverticulosis/ileal reosion/abnormal anorectal junction s/p biopsy: path for small intestine and Ti was benign with non-villous atrophy, rectal  biopsy with prominent prolapse changes, no acute inflammation  . CORNEAL TRANSPLANT Bilateral 2013  . ENTEROSCOPY N/A 06/13/2013   GXQ:JJHE Gastritis/GI BLEED MOST LIKELY DUE TO DUODENAL ULCERS, AND ? AVMs  . ESOPHAGOGASTRODUODENOSCOPY  06/05/11   small hiatal hernia; + H.PYLORI GASTRITIS, s/p 5 days of Pylera, unable to finish due to N/V  . ESOPHAGOGASTRODUODENOSCOPY N/A 06/13/2013   Dr. Oneida Alar- see enteroscopy  . ESOPHAGOGASTRODUODENOSCOPY (EGD) WITH ESOPHAGEAL DILATION N/A 06/08/2013   RDE:YCXKG hiatal hernia;  otherwise normal EGD s/p dilation  . GIVENS CAPSULE STUDY N/A 06/08/2013   Procedure: GIVENS CAPSULE STUDY;  Surgeon:  Daneil Dolin, MD;  Location: [facility];  Service: Endoscopy;  Laterality: N/A;  . HEMORRHOID BANDING    . KNEE SURGERY Right 05/2006   total right  . LEFT HEART CATH AND CORONARY ANGIOGRAPHY N/A 03/18/2017   Procedure: LEFT HEART CATH AND CORONARY ANGIOGRAPHY;  Surgeon: Wellington Hampshire, MD;  Location: [facility];  Service: Cardiovascular;  Laterality: N/A;  . LEG SURGERY    . MELANOMA EXCISION Left 08/2006   left leg excision  . NM MYOCAR PERF WALL MOTION  03/27/2010   dipyridamole; small reversible basal to mid-septal defect (?artifact), post-stress EF 55%, low risk scan   . PARTIAL HYSTERECTOMY  1976  . TONSILLECTOMY    . TOTAL HIP ARTHROPLASTY Left 2001&2004    X 2 FOR LEFT HIP  . TRANSTHORACIC ECHOCARDIOGRAM  10/06/2012   EF 55-60%, mod eccentric hypertrophy, grade 2 diastolic dysfunction; mildly calcifed AV annulus with moderate regurg; aortic root mildly dilated; LA severely dailted; PA peak pressure 71mHg  . VARICOSE VEIN SURGERY      reports that she quit smoking about 42 years ago. Her smoking use included cigarettes. She has a 30.00 pack-year smoking history. She has never used smokeless tobacco. She reports that she does not drink alcohol or use drugs. Social History        Socioeconomic History  . Marital status: Widowed    Spouse name: Not on file  . Number of children: 5  . Years of education: 6  . Highest education level: Not on file  Occupational History    Employer: RETIRED  Social Needs  . Financial resource strain: Not on file  . Food insecurity:    Worry: Not on file    Inability: Not on file  . Transportation needs:    Medical: Not on file    Non-medical: Not on file  Tobacco Use  . Smoking status: Former Smoker    Packs/day: 1.50    Years: 20.00    Pack years: 30.00    Types: Cigarettes    Last attempt to quit: 07/05/1975    Years since quitting: 42.8  . Smokeless tobacco: Never Used  . Tobacco  comment: Quit smoking in 1975  Substance and Sexual Activity  . Alcohol use: No  . Drug use: No  . Sexual activity: Never  Lifestyle  . Physical activity:    Days per week: Not on file    Minutes per session: Not on file  . Stress: Not on file  Relationships  . Social connections:    Talks on phone: Not on file    Gets together: Not on file    Attends religious service: Not on file    Active member of club or organization: Not on file    Attends meetings of clubs or organizations: Not on file    Relationship status: Not on file  . Intimate  partner violence:    Fear of current or ex partner: Not on file    Emotionally abused: Not on file    Physically abused: Not on file    Forced sexual activity: Not on file  Other Topics Concern  . Not on file  Social History Narrative  . Not on file    Functional Status Survey:       Family History  Problem Relation Age of Onset  . Breast cancer Daughter        also hyperlipidemia  . Breast cancer Daughter        also hyperlipidemia  . Asthma Mother   . Multiple sclerosis Child   . Hyperlipidemia Child   . Colon cancer Neg Hx         Health Maintenance  Topic Date Due  . TETANUS/TDAP  06/02/2018 (Originally 08/28/1941)  . INFLUENZA VACCINE  Completed  . DEXA SCAN  Completed  . PNA vac Low Risk Adult  Completed         Allergies  Allergen Reactions  . Codeine Diarrhea and Nausea Only  . Tape Other (See Comments)    SKIN IS VERY THIN AND TEARS AND BRUISES EASILY; PLEASE USE COBAN WRAP!!  . Diona Fanti [Aspirin] Rash and Other (See Comments)    Petechia   . Iron Nausea And Vomiting    Oral iron causes nausea and vomiting  . Penicillins Rash    Has patient had a PCN reaction causing immediate rash, facial/tongue/throat swelling, SOB or lightheadedness with hypotension: Yes Has patient had a PCN reaction causing severe rash involving mucus membranes or skin necrosis: Unknown Has  patient had a PCN reaction that required hospitalization: No Has patient had a PCN reaction occurring within the last 10 years: No If all of the above answers are "NO", then may proceed with Cephalosporin use.       MEDICATIONS     Medication Sig  . carvedilol (COREG) 3.125 MG tablet TAKE 1 TABLET BY MOUTH TWO  TIMES DAILY WITH MEALS  . clopidogrel (PLAVIX) 75 MG tablet Take 1 tablet (75 mg total) by mouth daily with breakfast.  . cyanocobalamin ([employer] VITAMIN B12) 2000 MCG tablet Take 1 tablet (2,000 mcg total) by mouth daily.  Marland Kitchen docusate sodium (COLACE) 100 MG capsule Take 200 mg by mouth 2 (two) times daily.  . folic acid (FOLVITE) 1 MG tablet TAKE 1 TABLET BY MOUTH EVERY DAY  . furosemide (LASIX) 20 MG tablet 04/09/18 dose decreased to 40 mg am and 20 mg pm  . gabapentin (NEURONTIN) 100 MG capsule TAKE 2 CAPSULES BY MOUTH 3  TIMES DAILY  . hydrocortisone (ANUSOL-HC) 25 MG suppository Place 1 suppository (25 mg total) rectally every 12 (twelve) hours.  Marland Kitchen ipratropium-albuterol (DUONEB) 0.5-2.5 (3) MG/3ML SOLN Take 3 mLs by nebulization every 6 (six) hours as needed (sob, wheeze).  .     .     . mometasone (ELOCON) 0.1 % cream Apply 1 application topically daily.  . ondansetron (ZOFRAN) 4 MG tablet TAKE 1 TABLET BY MOUTH  EVERY 8 HOURS AS NEEDED FOR NAUSEA AND VOMITING  . pantoprazole (PROTONIX) 40 MG tablet Take 1 tablet (40 mg total) by mouth 2 (two) times daily.  . potassium chloride SA (K-DUR,KLOR-CON) 20 MEQ tablet TAKE 1 TABLET BY MOUTH  DAILY  . prednisoLONE acetate (PRED FORTE) 1 % ophthalmic suspension Place 1 drop into both eyes 4 (four) times daily.   . predniSONE (DELTASONE) 50 MG tablet Take 1 tablet (50  mg total) by mouth daily with breakfast. X 3 days  . sodium chloride (MURO 128) 2 % ophthalmic solution Place 1 drop into both eyes 3 (three) times daily.   . traMADol-acetaminophen (ULTRACET) 37.5-325 MG tablet Take 1 tablet by mouth every 4 (four) hours as needed. for  pain   No facility-administered encounter medications on file as of 05/03/2018.    Review of systems.  General she is not complaining of fever chills.  Skin is not complaining of diaphoresis rashes or itching.  Head ears eyes nose mouth and throat is not complaining of visual changes or sore throat.  Respiratory does not have shortness of breath today does have a history of cough apparently this is stabilized  Cardiac does not complain of chest pain appears to have chronic venous stasis edema.  GI is not complaining of abdominal pain nausea vomiting diarrhea constipation.  GU is not complaining of dysuria.  Musculoskeletal continues to complain at times of hip and back pain which is chronic per daughter.  Neurologic does not complain of dizziness headache or numbness.  Does not complain of feeling increasingly weak.  Psych does not complain of being depressed or anxious appears to be in good spirits.  Physical exam.  She is afebrile pulse is 64 respirations of 19 blood pressure 98/48.  In general this is a very pleasant elderly female in no distress she appears spry and alert.  Her skin is warm and dry.  Eyes visual acuity appears to be intact she has prescription lenses sclera and conjunctive are clear.  Oropharynx is clear mucous membranes moist.  Chest is clear to auscultation there is no labored breathing.  Heart is regular rate and rhythm with some irregular beats- she appears to have chronic venous stasis mild edema bilaterally.  Abdomen is soft nontender with positive bowel sounds.   Of note patient deferred rectal exam    Musculoskeletal appears able to move all extremities x4- could not appreciate any deformities other than arthritic- there is some tenderness to palpation of the back but this is not new.  .  Neurologic is grossly intact her speech is clear no lateralizing findings.  Psych she continues to be pleasant appropriate largely alert  oriented   Labs.  May 14, 2018.  Sodium 141 potassium 4.4 BUN 22 creatinine 1.06.  WBC 10.5 hemoglobin 9.5 platelets 168.  Assessment plan.  1.-History of rectal bleeding- patient deferred occult blood testing today- this was discussed with her daughter at bedside as well- family and patient does not desire aggressive work-up of this-will monitor hemoglobin and updated labs this has been stable in the past- according to patient and daughter this is not new or increased from baseline-she does have Anusol for hemorrhoids as well as a previous history of diverticulitis-she is not complaining of any abdominal pain or increased weakness.  2.  History of A. fib-this appears rate controlled not on aggressive anticoagulation because of history of bleeding- she is on Plavix-she also continues on Coreg for rate control.  Coreg is low dose occasionally she will have systolic blood pressures under 100 but  is asymptomatic according to family this is not new and well-tolerated  3.-  History of pneumonia this appears to be stabilized status post antibiotic  #4 chronic kidney disease this appears stable with a creatinine of 1.06 on October 7- will update this.  5.  History of anemia hemoglobin has shown stability at around 9 was 9.5 on most recent lab will have this updated  in light of recent short episode of rectal bleeding.  6.  History of back and hip pain she does have an order for Ultracet as well as Neurontin apparently this is been fairly effective will monitor.  7.  History of CHF she continues on Lasix with potassium supplementation her weight continues to be stable will update a metabolic panel-she appears stable in this regards weights have been stable does not complain of increased chest pain or shortness of breath  [id number]

## 2018-05-17 ENCOUNTER — Encounter (HOSPITAL_COMMUNITY)
Admission: RE | Admit: 2018-05-17 | Discharge: 2018-05-17 | Disposition: A | Discharge status: Home / Self Care | Payer: Medicare Other | Source: Skilled Nursing Facility

## 2018-05-17 LAB — BASIC METABOLIC PANEL
Anion gap: 3 — ABNORMAL LOW (ref 5–15)
BUN: 26 mg/dL — ABNORMAL HIGH (ref 8–23)
CHLORIDE: 101 mmol/L (ref 98–111)
CO2: 34 mmol/L — ABNORMAL HIGH (ref 22–32)
CREATININE: 1.2 mg/dL — AB (ref 0.44–1.00)
Calcium: 7.7 mg/dL — ABNORMAL LOW (ref 8.9–10.3)
GFR calc non Af Amer: 38 mL/min — ABNORMAL LOW (ref >60)
GFR, EST AFRICAN AMERICAN: 44 mL/min — AB (ref >60)
GLUCOSE: 86 mg/dL (ref 70–99)
Potassium: 4.2 mmol/L (ref 3.5–5.1)
Sodium: 138 mmol/L (ref 135–145)

## 2018-05-18 ENCOUNTER — Non-Acute Institutional Stay (SKILLED_NURSING_FACILITY): Payer: Medicare Other | Admitting: Internal Medicine

## 2018-05-18 ENCOUNTER — Encounter: Payer: Self-pay | Admitting: Internal Medicine

## 2018-05-18 DIAGNOSIS — I482 Chronic atrial fibrillation, unspecified: Secondary | ICD-10-CM | POA: Diagnosis not present

## 2018-05-18 DIAGNOSIS — I5023 Acute on chronic systolic (congestive) heart failure: Secondary | ICD-10-CM | POA: Diagnosis not present

## 2018-05-18 DIAGNOSIS — N184 Chronic kidney disease, stage 4 (severe): Secondary | ICD-10-CM

## 2018-05-18 DIAGNOSIS — K5792 Diverticulitis of intestine, part unspecified, without perforation or abscess without bleeding: Secondary | ICD-10-CM

## 2018-05-18 DIAGNOSIS — R1312 Dysphagia, oropharyngeal phase: Secondary | ICD-10-CM | POA: Diagnosis not present

## 2018-05-18 NOTE — Progress Notes (Signed)
Location:    [facility] Room Number: 153/P Place of Service:  SNF [phone]) Provider: Veleta Miners MD  [name] Frizzle, MD  Patient Care Team: [name] Frizzle, MD as PCP - General (Family Medicine) [name] Commons, MD as PCP - Cardiology (Cardiology) [name] Romney Cristopher Estimable, MD (Gastroenterology) [name] Morn, MD (Inactive) as Consulting Physician (General Surgery)  Extended Emergency Contact Information Primary Emergency Contact: [name] [name] Address: [address] [place] [address] [place] Phone: [phone] Work Phone: [phone] Mobile Phone: [phone] Relation: Daughter Secondary Emergency Contact: [name] [name] Address: [address]          [place], [place] [address] [place] Phone: [phone] Mobile Phone: [phone] Relation: Daughter  Code Status:  DNR Goals of care: Advanced Directive information Advanced Directives 05/18/2018  Does Patient Have a Medical Advance Directive? Yes  Type of Advance Directive Out of facility DNR (pink MOST or yellow form)  Does patient want to make changes to medical advance directive? No - Patient declined  Copy of [place] in Chart? No - copy requested  Would patient like information on creating a medical advance directive? -  Pre-existing out of facility DNR order (yellow form or pink MOST form) -     Chief Complaint  Patient presents with  . Acute Visit    F/U    HPI:  Pt is a 84 y.o. female seen today for an acute visit for Follow up .  Patient was admitted  to SNF for therapy after Staying in the hospital from 09/23-09 /28 For  Acute CHF and Aspiration Pneumonitis.  Patient has a history of  Systolic CHF, CKD stage IV, chronic atrial fibrillation not on any anticoagulation secondary to GI bleed, hypertension, and GERD.    She was initially seen in ED on 09 /20 with lower abdominal pain.  CT of the abdomen  and pelvis showed acute diverticulitis of sigmoid colon.  Patient was sent home on Cipro and Flagyl.  Patient became increasingly confused and  family stopped her antibiotics. She was seen by her PCP on 9/23 and was found to be short of breath.    She was admitted with CHF . She was treated with IV Diuretics . She also was treated for Aspiration Pneumonitis with ABX and Steroids. Patient is now in SNF for therapy. Patient is doing well with therapy. D/W Therapy she is using walker with Min Assist. She does continue to have pain in her Lower Back and Hip due to her arthritis. She is also on Nectar thick Diet  She did have have episode of rectal Bleed which was resolved with Anusol. She lives by herself but has very involved Daughters who check in her Daily. She was walking with Walker at home and was independent in her ADL. Past Medical History:  Diagnosis Date  . Aortic insufficiency   . Ascending aortic aneurysm ([place])    a. 4.6cm by CT 2018 - pt deferred any further evaluation/invasive management therefore follow-up deferred.  . Bladder infection    h/o  . CHF (congestive heart failure) ([place] [facility])   . Chronic atrial fibrillation    a. not on anticoagulation due to prior GIB  . CKD (chronic kidney disease), stage III ([place])   . Coronary artery disease    a. mild nonobstructive by cath 2018.  Marland Kitchen DCIS (ductal carcinoma in situ) of breast 03/14/2013   right  breast  . Dehydration    HISTORY   . Fall at home 09/10/12  . Hemorrhoids   . Hip fracture ([place] [facility])    hip surgery 2001  . History of blood clots    in leg  . History of knee surgery   . Hypertension   . Iron deficiency anemia, unspecified 03/14/2013   secondary to GI blood loss  . Kidney infection    h/o  . Melanoma of skin ([facility]) 03/14/2013  . Melanoma of thigh ([facility])    left  . Mini stroke ([place] [facility])   . Mitral regurgitation   . Nonischemic cardiomyopathy ([facility])    a. EF 35-40% by echo in 03/2017 with cath showing nonobstructive CAD,  suspected stress-induced.  . S/P colonoscopy March 2010   RMR: friable anal canal hemorrhoids, hyperplastic ascending polyp, adenomatous descending polyp   . Stomach ulcer    secondary to h.pylori, s/p treatment  . Thyroid condition   . Tricuspid regurgitation   . Venous stasis    edema   Past Surgical History:  Procedure Laterality Date  . APPENDECTOMY  1942  . BREAST LUMPECTOMY  1998  . CARDIAC CATHETERIZATION    . CATARACT EXTRACTION, BILATERAL    . CHOLECYSTECTOMY    . COLONOSCOPY  06/05/11   pancolonic diverticulosis/ileal reosion/abnormal anorectal junction s/p biopsy: path for small intestine and Ti was benign with non-villous atrophy, rectal biopsy with prominent prolapse changes, no acute inflammation  . CORNEAL TRANSPLANT Bilateral 2013  . ENTEROSCOPY N/A 06/13/2013   MPN:TIRW Gastritis/GI BLEED MOST LIKELY DUE TO DUODENAL ULCERS, AND ? AVMs  . ESOPHAGOGASTRODUODENOSCOPY  06/05/11   small hiatal hernia; + H.PYLORI GASTRITIS, s/p 5 days of Pylera, unable to finish due to N/V  . ESOPHAGOGASTRODUODENOSCOPY N/A 06/13/2013   Dr. Oneida Alar- see enteroscopy  . ESOPHAGOGASTRODUODENOSCOPY (EGD) WITH ESOPHAGEAL DILATION N/A 06/08/2013   ERX:VQMGQ hiatal hernia;  otherwise normal EGD s/p dilation  . GIVENS CAPSULE STUDY N/A 06/08/2013   Procedure: GIVENS CAPSULE STUDY;  Surgeon: Daneil Dolin, MD;  [facility];  Service: Endoscopy;  Laterality: N/A;  . HEMORRHOID BANDING    . KNEE SURGERY Right 05/2006   total right  . LEFT HEART CATH AND CORONARY ANGIOGRAPHY N/A 03/18/2017   Procedure: LEFT HEART CATH AND CORONARY ANGIOGRAPHY;  Surgeon: Wellington Hampshire, MD;  Location: [facility];  Service: Cardiovascular;  Laterality: N/A;  . LEG SURGERY    . MELANOMA EXCISION Left 08/2006   left leg excision  . NM MYOCAR PERF WALL MOTION  03/27/2010   dipyridamole; small reversible basal to mid-septal defect (?artifact), post-stress EF 55%, low risk scan   . PARTIAL HYSTERECTOMY   1976  . TONSILLECTOMY    . TOTAL HIP ARTHROPLASTY Left 2001&2004    X 2 FOR LEFT HIP  . TRANSTHORACIC ECHOCARDIOGRAM  10/06/2012   EF 55-60%, mod eccentric hypertrophy, grade 2 diastolic dysfunction; mildly calcifed AV annulus with moderate regurg; aortic root mildly dilated; LA severely dailted; PA peak pressure 39mHg  . VARICOSE VEIN SURGERY      Allergies  Allergen Reactions  . Codeine Diarrhea and Nausea Only  . Tape Other (See Comments)    SKIN IS VERY THIN AND TEARS AND BRUISES EASILY; PLEASE USE COBAN WRAP!!  . Diona Fanti [Aspirin] Rash and Other (See Comments)    Petechia   . Iron Nausea And Vomiting    Oral iron causes nausea and vomiting  . Penicillins Rash    Has patient had a PCN reaction  causing immediate rash, facial/tongue/throat swelling, SOB or lightheadedness with hypotension: Yes Has patient had a PCN reaction causing severe rash involving mucus membranes or skin necrosis: Unknown Has patient had a PCN reaction that required hospitalization: No Has patient had a PCN reaction occurring within the last 10 years: No If all of the above answers are "NO", then may proceed with Cephalosporin use.     Outpatient Encounter Medications as of 05/18/2018  Medication Sig  . carvedilol (COREG) 3.125 MG tablet TAKE 1 TABLET BY MOUTH TWO  TIMES DAILY WITH MEALS  . clopidogrel (PLAVIX) 75 MG tablet Take 1 tablet (75 mg total) by mouth daily with breakfast.  . cyanocobalamin ([employer] VITAMIN B12) 2000 MCG tablet Take 1 tablet (2,000 mcg total) by mouth daily.  Marland Kitchen docusate sodium (COLACE) 100 MG capsule Take 200 mg by mouth 2 (two) times daily.  . folic acid (FOLVITE) 1 MG tablet TAKE 1 TABLET BY MOUTH EVERY DAY  . furosemide (LASIX) 20 MG tablet 04/09/18 dose decreased to 40 mg am and 20 mg pm  . gabapentin (NEURONTIN) 100 MG capsule TAKE 2 CAPSULES BY MOUTH 3  TIMES DAILY  . hydrocortisone (ANUSOL-HC) 25 MG suppository Place 1 suppository (25 mg total) rectally every 12 (twelve) hours.  Marland Kitchen  ipratropium-albuterol (DUONEB) 0.5-2.5 (3) MG/3ML SOLN Take 3 mLs by nebulization every 6 (six) hours as needed (sob, wheeze).  . mometasone (ELOCON) 0.1 % cream Apply 1 application topically daily.  . ondansetron (ZOFRAN) 4 MG tablet TAKE 1 TABLET BY MOUTH  EVERY 8 HOURS AS NEEDED FOR NAUSEA AND VOMITING  . pantoprazole (PROTONIX) 40 MG tablet Take 1 tablet (40 mg total) by mouth 2 (two) times daily.  . potassium chloride SA (K-DUR,KLOR-CON) 20 MEQ tablet TAKE 1 TABLET BY MOUTH  DAILY  . prednisoLONE acetate (PRED FORTE) 1 % ophthalmic suspension Place 1 drop into both eyes 4 (four) times daily.   . sodium chloride (MURO 128) 2 % ophthalmic solution Place 1 drop into both eyes 3 (three) times daily.   . traMADol-acetaminophen (ULTRACET) 37.5-325 MG tablet Take 1 tablet by mouth every 4 (four) hours as needed. for pain  . [DISCONTINUED] levofloxacin (LEVAQUIN) 250 MG tablet Take 1 tablet (250 mg total) by mouth daily.  . [DISCONTINUED] metroNIDAZOLE (FLAGYL) 500 MG tablet Take 1 tablet (500 mg total) by mouth 3 (three) times daily. X 5 days  . [DISCONTINUED] predniSONE (DELTASONE) 50 MG tablet Take 1 tablet (50 mg total) by mouth daily with breakfast. X 3 days   No facility-administered encounter medications on file as of 05/18/2018.      Review of Systems  Constitutional: Negative for activity change and appetite change.  HENT: Negative.   Respiratory: Negative for cough and shortness of breath.   Cardiovascular: Negative.   Gastrointestinal: Negative.   Genitourinary: Negative.   Musculoskeletal: Positive for arthralgias and back pain.  Skin: Negative.   Neurological: Negative.   Psychiatric/Behavioral: Negative.     Immunization History  Administered Date(s) Administered  . Influenza Split 04/24/2016  . Influenza, High Dose Seasonal PF 04/27/2018  . Influenza,inj,Quad PF,6+ Mos 04/13/2014, 05/03/2015, 04/13/2017  . Pneumococcal Conjugate-13 02/10/2017  . Pneumococcal  Polysaccharide-23 06/22/1995   Pertinent  Health Maintenance Due  Topic Date Due  . INFLUENZA VACCINE  Completed  . DEXA SCAN  Completed  . PNA vac Low Risk Adult  Completed   Fall Risk  04/21/2017 04/21/2017 04/13/2014  Falls in the past year? No No Yes  Number falls in past yr: - -  1  Injury with Fall? - - No  Risk for fall due to : - - History of fall(s)   Functional Status Survey:    Vitals:   05/18/18 1057  BP: 112/62  Pulse: 64  Resp: 18  Temp: 98.1 F (36.7 C)  TempSrc: Oral   There is no height or weight on file to calculate BMI. Physical Exam  Constitutional: She appears well-developed and well-nourished.  HENT:  Head: Normocephalic.  Mouth/Throat: Oropharynx is clear and moist.  Eyes: Pupils are equal, round, and reactive to light.  Neck: Neck supple.  Cardiovascular: Normal rate and regular rhythm.  Murmur heard. Pulmonary/Chest: Effort normal and breath sounds normal. No stridor. No respiratory distress. She has no wheezes.  Abdominal: Soft. Bowel sounds are normal. She exhibits no distension. There is no tenderness. There is no guarding.  Musculoskeletal:  Mild edema Bilateral  Neurological: She is alert.  Did not knew the year but knew everything else. Has good strength on all extremities. Little Weak in Right LE  Skin: Skin is warm and dry.  Psychiatric: She has a normal mood and affect. Her behavior is normal. Thought content normal.    Labs reviewed: Recent Labs    04/29/18 0424  05/10/18 0700 05/15/18 0700 05/17/18 0300  NA 138   < > 141 138 138  K 3.7   < > 4.4 5.2* 4.2  CL 99   < > 106 101 101  CO2 27   < > 29 29 34*  GLUCOSE 105*   < > 107* 93 86  BUN 25*   < > 22 28* 26*  CREATININE 1.37*   < > 1.06* 1.24* 1.20*  CALCIUM 7.8*   < > 7.4* 7.8* 7.7*  MG 2.2  --   --   --   --    < > = values in this interval not displayed.   Recent Labs    04/23/18 1106 04/26/18 1114 04/28/18 1341  AST 14* 24 27  ALT 9 12 14   ALKPHOS 61 64 71    BILITOT 0.9 0.8 1.0  PROT 6.7 6.7 6.6  ALBUMIN 3.1* 3.1* 3.2*   Recent Labs    04/26/18 1114  05/03/18 0600 05/10/18 0700 05/15/18 0700  WBC 6.9   < > 7.8 10.5 6.2  NEUTROABS 5.5  --  5.4  --  3.9  HGB 9.3*   < > 9.9* 9.5* 9.3*  HCT 30.8*   < > 33.5* 32.0* 32.4*  MCV 95.1   < > 95.4 95.0 94.7  PLT 217   < > 261 168 200   < > = values in this interval not displayed.   Lab Results  Component Value Date   TSH 3.012 04/26/2018   Lab Results  Component Value Date   HGBA1C 5.5 03/17/2017   Lab Results  Component Value Date   CHOL 189 03/17/2017   HDL 55 03/17/2017   LDLCALC 119 (H) 03/17/2017   TRIG 73 03/17/2017   CHOLHDL 3.4 03/17/2017    Significant Diagnostic Results in last 30 days:  Ct Abdomen Pelvis Wo Contrast  Result Date: 04/23/2018 CLINICAL DATA:  Abdominal distension EXAM: CT ABDOMEN AND PELVIS WITHOUT CONTRAST TECHNIQUE: Multidetector CT imaging of the abdomen and pelvis was performed following the standard protocol without IV contrast. COMPARISON:  None. FINDINGS: Lower chest: Mild scarring is noted in the bases bilaterally. Heart is enlarged. The cardiac blood pool is somewhat hypodense which may be related to underlying anemia. Hepatobiliary: No  focal liver abnormality is seen. Status post cholecystectomy. No biliary dilatation. Pancreas: Unremarkable. No pancreatic ductal dilatation or surrounding inflammatory changes. Spleen: Normal in size without focal abnormality. Adrenals/Urinary Tract: Adrenal glands are within normal limits. Kidneys are well visualized bilaterally without renal calculi or obstructive changes. Left renal cyst is noted. The bladder is decompressed. Stomach/Bowel: There are inflammatory changes at the junction of the sigmoid and rectum with associated diverticular change and wall thickening. This is consistent with acute diverticulitis. Scattered mild inflammatory change in the more proximal sigmoid colon is seen. The appendix has been  surgically removed. No other obstructive or inflammatory changes are noted. No abscess or perforation is seen. Vascular/Lymphatic: Aortic atherosclerosis. No enlarged abdominal or pelvic lymph nodes. Reproductive: Uterus has been surgically removed. No adnexal mass is seen. Other: No abdominal wall hernia or abnormality. No abdominopelvic ascites. Musculoskeletal: Left hip replacement is noted. There is a chronic appearing right hip joint effusion. Degenerative changes of the lumbar spine are noted. No compression deformity is seen. IMPRESSION: Changes consistent with acute diverticulitis at the junction of the sigmoid and rectum with some more mild diverticular inflammatory change in the more proximal sigmoid colon. No abscess or perforation is noted at this time. Chronic right hip joint effusion. No other acute abnormality is noted. Electronically Signed   By: Inez Catalina M.D.   On: 04/23/2018 12:42   Dg Chest 2 View  Result Date: 04/26/2018 CLINICAL DATA:  Shortness of breath EXAM: CHEST - 2 VIEW COMPARISON:  None FINDINGS: The lungs are hyperinflated likely secondary to COPD. There is hazy left perihilar airspace disease which may reflect atelectasis versus pneumonia. There is no pleural effusion or pneumothorax. There is stable cardiomegaly. The osseous structures are unremarkable. IMPRESSION: Hazy left perihilar airspace disease which may reflect atelectasis versus pneumonia. Electronically Signed   By: Kathreen Devoid   On: 04/26/2018 11:47   Dg Esophagus  Result Date: 04/29/2018 CLINICAL DATA:  Dysphagia, episode difficulty breathing wheezing yesterday EXAM: ESOPHOGRAM/BARIUM SWALLOW TECHNIQUE: Single contrast examination was performed using thin barium. Patient also swallowed a 12.5 mm diameter barium tablet. FLUOROSCOPY TIME:  Fluoroscopy Time:  1 minutes 54 seconds Radiation Exposure Index (if provided by the fluoroscopic device): 35.3 mGy Number of Acquired Spot Images: multiple fluoroscopic screen  captures COMPARISON:  None FINDINGS: Examination performed with patient supine and RPO with head of bed elevated 20 degrees. Grossly normal esophageal distention. No definite soft esophageal mass or stricture. Targeted rapid sequence imaging of the cervical esophagus and hypopharynx showed no laryngeal penetration or aspiration. No significant residuals. Significant diffuse impairment of esophageal motility with poor clearance of contrast from the thoracic esophagus with the patient elevated 20 degrees. Esophageal walls appear smooth without irregularity. 12.5 mm diameter barium tablet passed from oral cavity to the junction of the middle and distal thirds of the thoracic esophagus, did not pass more distally due primarily to severe motility issues in patient supine with head of bed elevated 20 degrees since; unable to assess diameter of the distal third of the thoracic esophagus with the barium tablet. IMPRESSION: Significant age-related esophageal dysmotility. No esophageal mass or stricture grossly identified. Electronically Signed   By: Lavonia Dana M.D.   On: 04/29/2018 15:36   US Venous Img Lower Bilateral  Result Date: 04/28/2018 CLINICAL DATA:  Pain and edema EXAM: BILATERAL LOWER EXTREMITY VENOUS DOPPLER ULTRASOUND TECHNIQUE: Gray-scale sonography with compression, as well as color and duplex ultrasound, were performed to evaluate the deep venous system from the level of  the common femoral vein through the popliteal and proximal calf veins. COMPARISON:  None FINDINGS: Normal compressibility of the common femoral, superficial femoral, and popliteal veins, as well as the proximal calf veins. No filling defects to suggest DVT on grayscale or color Doppler imaging. Doppler waveforms show normal direction of venous flow, normal respiratory phasicity and response to augmentation. 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 The left great saphenous vein is dilated distally, compressible. IMPRESSION: No  evidence of  lower extremity deep vein thrombosis. Electronically Signed   By: Lucrezia Europe M.D.   On: 04/28/2018 14:33   Dg Chest Port 1 View  Result Date: 04/28/2018 CLINICAL DATA:  Respiratory distress EXAM: PORTABLE CHEST 1 VIEW COMPARISON:  April 26, 2018 FINDINGS: The mediastinal contour stable heart size is enlarged. The aorta is tortuous. There are increased pulmonary interstitium bilaterally. There is no focal pneumonia or pleural effusion. The visualized skeletal structures are stable. IMPRESSION: Mild congestive heart failure. Electronically Signed   By: Abelardo Diesel M.D.   On: 04/28/2018 13:20    Assessment/Plan   Acute on chronic systolic CHF (congestive heart failure) Repeat Echo actually shows improvement in her EF of 60% She is back to her Baseline weight of 160 lbs Will slowly taper her off oxygen Continue Increased dose of Lasix Repeat BMP Continue to follow weights Continue Coreg  Atrial fibrillation, chronic  Rate Control on Coreg Not on any anticoagulation Continue Plavix  Aspiration pneumonia  Patient finished Levaquin and Flagyl And Also done with  prednisone Taper.  Oropharyngeal dysphagia Was evaluated by Barium swallow and Bedside eval by Speech. She has Esophageal Immobility due to her Age and also Aspiration On Nectar thick Fluids for no  Acute diverticulitis and recent Rectal Bleed Finished  Levaquin and Flagyl Asymptomatic now Was diagnosed with CT scan on 09/20 Was also started on Anusol . No more bleeding. Hgb Stable Patient family does not want any aggressive work up. She has follow up with Dr [name] Romney next month  Chronic renal failure, stage 4 (severe) Creat at baseline  Anemia,  Iron studies and B12 was Normal Hgb Stable Right Hip Pain with Lower Back pain Seems Chronic Continue Ultram PRN and Neurontin Discharge  Patient will go home with help from her daughter.  Family/ staff Communication:   Labs/tests ordered:

## 2018-05-20 ENCOUNTER — Other Ambulatory Visit (HOSPITAL_COMMUNITY): Payer: Self-pay | Admitting: Specialist

## 2018-05-20 DIAGNOSIS — R1319 Other dysphagia: Secondary | ICD-10-CM

## 2018-05-25 ENCOUNTER — Other Ambulatory Visit: Payer: Self-pay | Admitting: *Deleted

## 2018-05-25 ENCOUNTER — Non-Acute Institutional Stay (SKILLED_NURSING_FACILITY): Payer: Medicare Other | Admitting: Internal Medicine

## 2018-05-25 ENCOUNTER — Other Ambulatory Visit (HOSPITAL_COMMUNITY)
Admission: RE | Admit: 2018-05-25 | Discharge: 2018-05-25 | Disposition: A | Discharge status: Home / Self Care | Payer: Medicare Other | Source: Skilled Nursing Facility | Attending: Internal Medicine | Admitting: Internal Medicine

## 2018-05-25 ENCOUNTER — Encounter: Payer: Self-pay | Admitting: Internal Medicine

## 2018-05-25 DIAGNOSIS — I482 Chronic atrial fibrillation, unspecified: Secondary | ICD-10-CM | POA: Insufficient documentation

## 2018-05-25 DIAGNOSIS — E876 Hypokalemia: Secondary | ICD-10-CM | POA: Diagnosis not present

## 2018-05-25 DIAGNOSIS — N184 Chronic kidney disease, stage 4 (severe): Secondary | ICD-10-CM

## 2018-05-25 DIAGNOSIS — I10 Essential (primary) hypertension: Secondary | ICD-10-CM

## 2018-05-25 DIAGNOSIS — D508 Other iron deficiency anemias: Secondary | ICD-10-CM | POA: Diagnosis not present

## 2018-05-25 DIAGNOSIS — I5023 Acute on chronic systolic (congestive) heart failure: Secondary | ICD-10-CM

## 2018-05-25 LAB — CBC
HEMATOCRIT: 29 % — AB (ref 36.0–46.0)
HEMOGLOBIN: 8.5 g/dL — AB (ref 12.0–15.0)
MCH: 27.6 pg (ref 26.0–34.0)
MCHC: 29.3 g/dL — ABNORMAL LOW (ref 30.0–36.0)
MCV: 94.2 fL (ref 80.0–100.0)
PLATELETS: 168 10*3/uL (ref 150–400)
RBC: 3.08 MIL/uL — AB (ref 3.87–5.11)
RDW: 17 % — ABNORMAL HIGH (ref 11.5–15.5)
WBC: 5.3 10*3/uL (ref 4.0–10.5)
nRBC: 0 % (ref 0.0–0.2)

## 2018-05-25 LAB — BASIC METABOLIC PANEL
ANION GAP: 7 (ref 5–15)
BUN: 29 mg/dL — ABNORMAL HIGH (ref 8–23)
CHLORIDE: 100 mmol/L (ref 98–111)
CO2: 32 mmol/L (ref 22–32)
Calcium: 7.7 mg/dL — ABNORMAL LOW (ref 8.9–10.3)
Creatinine, Ser: 1.41 mg/dL — ABNORMAL HIGH (ref 0.44–1.00)
GFR calc non Af Amer: 31 mL/min — ABNORMAL LOW (ref >60)
GFR, EST AFRICAN AMERICAN: 36 mL/min — AB (ref >60)
GLUCOSE: 100 mg/dL — AB (ref 70–99)
Potassium: 3.1 mmol/L — ABNORMAL LOW (ref 3.5–5.1)
Sodium: 139 mmol/L (ref 135–145)

## 2018-05-25 NOTE — Patient Outreach (Signed)
[facility]) Care Management  05/25/2018  LANIQUA TORRENS 03-14-1923 [id number]   Onsite IDT meeting at [facility] discharge home this weekend. She will have [facility] advancing you home upon discharge along with PCS services. Patient is on the wait list for CAP services.   Met with patient in her room. She hopes to go home this weekend. She states her daughters assist her and that this RNCM could speak to either [facility] or [facility].  She does not know their number.  This RNCM left packet and RNCM contact and will follow up with daughters as indicated. Will refer to [facility] if daughters agree. Royetta Crochet. Laymond Purser, RN, BSN, [facility] [phone]) Business Cell  [phone]) Toll Free Office

## 2018-05-25 NOTE — Progress Notes (Signed)
Location:    [facility] Room Number: 153/P Place of Service:  SNF [phone]) Provider:  Elige Radon, MD  Patient Care Team: [name], MD as PCP - General (Family Medicine) Herminio Commons, MD as PCP - Cardiology (Cardiology) Gala Romney Cristopher Estimable, MD (Gastroenterology) Felicie Morn, MD (Inactive) as Consulting Physician (General Surgery)  Extended Emergency Contact Information Primary Emergency Contact: Stanfield,Teresa Address: [address] [place] [address] [place] Phone: [phone] Work Phone: [phone] Mobile Phone: [phone] Relation: Daughter Secondary Emergency Contact: Paschal,Frances Address: [address]          [place], [place] [address] [place] Phone: [phone] Mobile Phone: [phone] Relation: Daughter  Code Status:  DNR Goals of care: Advanced Directive information Advanced Directives 05/25/2018  Does Patient Have a Medical Advance Directive? Yes  Type of Advance Directive Out of facility DNR (pink MOST or yellow form)  Does patient want to make changes to medical advance directive? No - Patient declined  Copy of [place] in Chart? No - copy requested  Would patient like information on creating a medical advance directive? -  Pre-existing out of facility DNR order (yellow form or pink MOST form) -     Chief Complaint  Patient presents with  . Acute Visit    Low potassuim    HPI:  Pt is a 84 y.o. female seen today for an acute visit for hypokalemia-.   Patient is here for rehab after hospital admission for acute CHF and aspiration pneumonitis.  She has a history of systolic CHF which actually shows improvement with an ejection fraction of 60% most recent cardiac echo- he also has chronic kidney disease stage IV in addition to atrial fibrillation not on anticoagulation because of GI bleed history- she also has a  history of hypertension as well as GERD.  Her stay here is been fairly unremarkable- she does have some history of rectal bleeding but this appears to have largely resolved with the Anusol I suspect that the bleeding may be due more to hemorrhoids-   She previously did have a history of diverticulitis as well with rectal bleeding- hemoglobin has shown some stability although lab today shows a slight drop down to 8.5 she had been running more in the nines recently.  Family does not wish aggressive work-up they do have GI follow-up scheduled apparently for next month.  She is on Lasix 40 mg in the morning 20 mg at night in regards to her CHF with 20 mEq of potassium a day as well- her weight actually appears to be going down a bit as well as her edema weight today is 148.9 which is down from about 160 earlier this month.  Labs today show relative stability but it does show a potassium of 3.1 suspect this is due to her diuretics-and she is on potassium 20 mEq a day  She is not complaining of any increased shortness of breath or chest pain she does have chronic joint and back pain but this appears relatively stabilized with the tramadol.  Currently she is sitting in her wheelchair comfortably continues to be bright alert talkative- appears to be at her baseline  Past Medical History:  Diagnosis Date  . Aortic insufficiency   . Ascending aortic aneurysm ([facility])    a. 4.6cm by CT 2018 - pt deferred any further evaluation/invasive management therefore  follow-up deferred.  . Bladder infection    h/o  . CHF (congestive heart failure) ([facility])   . Chronic atrial fibrillation    a. not on anticoagulation due to prior GIB  . CKD (chronic kidney disease), stage III ([facility])   . Coronary artery disease    a. mild nonobstructive by cath 2018.  Marland Kitchen DCIS (ductal carcinoma in situ) of breast 03/14/2013   right breast  . Dehydration    HISTORY   . Fall at home 09/10/12  . Hemorrhoids   . Hip fracture ([facility])     hip surgery 2001  . History of blood clots    in leg  . History of knee surgery   . Hypertension   . Iron deficiency anemia, unspecified 03/14/2013   secondary to GI blood loss  . Kidney infection    h/o  . Melanoma of skin ([facility]) 03/14/2013  . Melanoma of thigh ([facility])    left  . Mini stroke ([facility])   . Mitral regurgitation   . Nonischemic cardiomyopathy ([facility])    a. EF 35-40% by echo in 03/2017 with cath showing nonobstructive CAD, suspected stress-induced.  . S/P colonoscopy March 2010   RMR: friable anal canal hemorrhoids, hyperplastic ascending polyp, adenomatous descending polyp   . Stomach ulcer    secondary to h.pylori, s/p treatment  . Thyroid condition   . Tricuspid regurgitation   . Venous stasis    edema   Past Surgical History:  Procedure Laterality Date  . APPENDECTOMY  1942  . BREAST LUMPECTOMY  1998  . CARDIAC CATHETERIZATION    . CATARACT EXTRACTION, BILATERAL    . CHOLECYSTECTOMY    . COLONOSCOPY  06/05/11   pancolonic diverticulosis/ileal reosion/abnormal anorectal junction s/p biopsy: path for small intestine and Ti was benign with non-villous atrophy, rectal biopsy with prominent prolapse changes, no acute inflammation  . CORNEAL TRANSPLANT Bilateral 2013  . ENTEROSCOPY N/A 06/13/2013   UDJ:SHFW Gastritis/GI BLEED MOST LIKELY DUE TO DUODENAL ULCERS, AND ? AVMs  . ESOPHAGOGASTRODUODENOSCOPY  06/05/11   small hiatal hernia; + H.PYLORI GASTRITIS, s/p 5 days of Pylera, unable to finish due to N/V  . ESOPHAGOGASTRODUODENOSCOPY N/A 06/13/2013   Dr. Oneida Alar- see enteroscopy  . ESOPHAGOGASTRODUODENOSCOPY (EGD) WITH ESOPHAGEAL DILATION N/A 06/08/2013   YOV:ZCHYI hiatal hernia;  otherwise normal EGD s/p dilation  . GIVENS CAPSULE STUDY N/A 06/08/2013   Procedure: GIVENS CAPSULE STUDY;  Surgeon: Daneil Dolin, MD;  [facility];  Service: Endoscopy;  Laterality: N/A;  . HEMORRHOID BANDING    . KNEE SURGERY Right 05/2006   total right  . LEFT HEART CATH AND  CORONARY ANGIOGRAPHY N/A 03/18/2017   Procedure: LEFT HEART CATH AND CORONARY ANGIOGRAPHY;  Surgeon: Wellington Hampshire, MD;  Location: [facility];  Service: Cardiovascular;  Laterality: N/A;  . LEG SURGERY    . MELANOMA EXCISION Left 08/2006   left leg excision  . NM MYOCAR PERF WALL MOTION  03/27/2010   dipyridamole; small reversible basal to mid-septal defect (?artifact), post-stress EF 55%, low risk scan   . PARTIAL HYSTERECTOMY  1976  . TONSILLECTOMY    . TOTAL HIP ARTHROPLASTY Left 2001&2004    X 2 FOR LEFT HIP  . TRANSTHORACIC ECHOCARDIOGRAM  10/06/2012   EF 55-60%, mod eccentric hypertrophy, grade 2 diastolic dysfunction; mildly calcifed AV annulus with moderate regurg; aortic root mildly dilated; LA severely dailted; PA peak pressure 61mHg  . VARICOSE VEIN SURGERY      Allergies  Allergen Reactions  .  Codeine Diarrhea and Nausea Only  . Tape Other (See Comments)    SKIN IS VERY THIN AND TEARS AND BRUISES EASILY; PLEASE USE COBAN WRAP!!  . Diona Fanti [Aspirin] Rash and Other (See Comments)    Petechia   . Iron Nausea And Vomiting    Oral iron causes nausea and vomiting  . Penicillins Rash    Has patient had a PCN reaction causing immediate rash, facial/tongue/throat swelling, SOB or lightheadedness with hypotension: Yes Has patient had a PCN reaction causing severe rash involving mucus membranes or skin necrosis: Unknown Has patient had a PCN reaction that required hospitalization: No Has patient had a PCN reaction occurring within the last 10 years: No If all of the above answers are "NO", then may proceed with Cephalosporin use.     Outpatient Encounter Medications as of 05/25/2018  Medication Sig  . carvedilol (COREG) 3.125 MG tablet TAKE 1 TABLET BY MOUTH TWO  TIMES DAILY WITH MEALS  . clopidogrel (PLAVIX) 75 MG tablet Take 1 tablet (75 mg total) by mouth daily with breakfast.  . cyanocobalamin 1000 MCG tablet Take 1,000 mcg by mouth daily.  Marland Kitchen docusate sodium (COLACE)  100 MG capsule Take 200 mg by mouth 2 (two) times daily.  . folic acid (FOLVITE) 1 MG tablet TAKE 1 TABLET BY MOUTH EVERY DAY  . furosemide (LASIX) 20 MG tablet 04/09/18 dose decreased to 40 mg am and 20 mg pm  . gabapentin (NEURONTIN) 100 MG capsule TAKE 2 CAPSULES BY MOUTH 3  TIMES DAILY  . hydrocortisone (ANUSOL-HC) 25 MG suppository Place 1 suppository (25 mg total) rectally every 12 (twelve) hours.  Marland Kitchen ipratropium-albuterol (DUONEB) 0.5-2.5 (3) MG/3ML SOLN Take 3 mLs by nebulization every 6 (six) hours as needed (sob, wheeze).  . mometasone (ELOCON) 0.1 % cream Apply 1 application topically daily.  . ondansetron (ZOFRAN) 4 MG tablet TAKE 1 TABLET BY MOUTH  EVERY 8 HOURS AS NEEDED FOR NAUSEA AND VOMITING  . pantoprazole (PROTONIX) 40 MG tablet Take 1 tablet (40 mg total) by mouth 2 (two) times daily.  . potassium chloride SA (K-DUR,KLOR-CON) 20 MEQ tablet TAKE 1 TABLET BY MOUTH  DAILY  . prednisoLONE acetate (PRED FORTE) 1 % ophthalmic suspension Place 1 drop into both eyes 4 (four) times daily.   . sodium chloride (MURO 128) 2 % ophthalmic solution Place 1 drop into both eyes 3 (three) times daily.   . traMADol-acetaminophen (ULTRACET) 37.5-325 MG tablet Take 1 tablet by mouth every 4 (four) hours as needed. for pain  . [DISCONTINUED] cyanocobalamin ([employer] VITAMIN B12) 2000 MCG tablet Take 1 tablet (2,000 mcg total) by mouth daily. (Patient taking differently: Take 1,000 mcg by mouth daily. )   No facility-administered encounter medications on file as of 05/25/2018.     Review of Systems   General she is not complaining any fever chills.  Skin is not complain of rashes or itching.  Head ears eyes nose mouth and throat does not complain of visual changes or sore throat she does have limited vision of her right eye chronic   Respiratory is not complaining of shortness of breath or cough.  Cardiac denies chest pain her edema appears to be fairly well controlled.  GI is not complaining of  abdominal pain nausea vomiting diarrhea constipation.  GU does not complain of dysuria.  Musculoskeletal does have somewhat chronic complaints of joint pain mainly back pain but the tramadol appears to help.  Neurologic does not complain of dizziness headache or numbness.  And  psych does not complain of being depressed or anxious continues to be in good spirits    Immunization History  Administered Date(s) Administered  . Influenza Split 04/24/2016  . Influenza, High Dose Seasonal PF 04/27/2018  . Influenza,inj,Quad PF,6+ Mos 04/13/2014, 05/03/2015, 04/13/2017  . Pneumococcal Conjugate-13 02/10/2017  . Pneumococcal Polysaccharide-23 06/22/1995   Pertinent  Health Maintenance Due  Topic Date Due  . INFLUENZA VACCINE  Completed  . DEXA SCAN  Completed  . PNA vac Low Risk Adult  Completed   Fall Risk  04/21/2017 04/21/2017 04/13/2014  Falls in the past year? No No Yes  Number falls in past yr: - - 1  Injury with Fall? - - No  Risk for fall due to : - - History of fall(s)   Functional Status Survey:     -Temperature is 98.2 pulse is 60 respiration 17 blood pressure 106/46. Her weight is 148.9 pounds this appears to be gradually going down from a high of 160 earlier this month   Physical Exam   In general this is a very pleasant elderly female in no distress  Her skin is warm and dry.  Eyes sclera and conjunctive are clear she does have blurring of her right eye which is chronic.  Oropharynx is clear mucous membranes moist.  Chest is clear to auscultation there is no labored breathing.  Heart is regular irregular rate and rhythm with a slight systolic murmur- she has mild lower extremity edema her legs are currently elevated.  Pedal pulses are intact bilaterally.  Abdomen is soft nontender with positive bowel sounds.  Musculoskeletal Limited exam since she is in a chair but appears able to move her upper extremities at baseline- again she does have mild edema  bilaterally.  Neurologic is grossly intact her speech is clear no lateralizing findings.  Psych she is largely alert and oriented very pleasant and appropriate  Labs reviewed: Recent Labs    04/29/18 0424  05/15/18 0700 05/17/18 0300 05/25/18 0408  NA 138   < > 138 138 139  K 3.7   < > 5.2* 4.2 3.1*  CL 99   < > 101 101 100  CO2 27   < > 29 34* 32  GLUCOSE 105*   < > 93 86 100*  BUN 25*   < > 28* 26* 29*  CREATININE 1.37*   < > 1.24* 1.20* 1.41*  CALCIUM 7.8*   < > 7.8* 7.7* 7.7*  MG 2.2  --   --   --   --    < > = values in this interval not displayed.   Recent Labs    04/23/18 1106 04/26/18 1114 04/28/18 1341  AST 14* 24 27  ALT 9 12 14   ALKPHOS 61 64 71  BILITOT 0.9 0.8 1.0  PROT 6.7 6.7 6.6  ALBUMIN 3.1* 3.1* 3.2*   Recent Labs    04/26/18 1114  05/03/18 0600 05/10/18 0700 05/15/18 0700 05/25/18 0408  WBC 6.9   < > 7.8 10.5 6.2 5.3  NEUTROABS 5.5  --  5.4  --  3.9  --   HGB 9.3*   < > 9.9* 9.5* 9.3* 8.5*  HCT 30.8*   < > 33.5* 32.0* 32.4* 29.0*  MCV 95.1   < > 95.4 95.0 94.7 94.2  PLT 217   < > 261 168 200 168   < > = values in this interval not displayed.   Lab Results  Component Value Date   TSH 3.012  04/26/2018   Lab Results  Component Value Date   HGBA1C 5.5 03/17/2017   Lab Results  Component Value Date   CHOL 189 03/17/2017   HDL 55 03/17/2017   LDLCALC 119 (H) 03/17/2017   TRIG 73 03/17/2017   CHOLHDL 3.4 03/17/2017    Significant Diagnostic Results in last 30 days:  Dg Chest 2 View  Result Date: 04/26/2018 CLINICAL DATA:  Shortness of breath EXAM: CHEST - 2 VIEW COMPARISON:  None FINDINGS: The lungs are hyperinflated likely secondary to COPD. There is hazy left perihilar airspace disease which may reflect atelectasis versus pneumonia. There is no pleural effusion or pneumothorax. There is stable cardiomegaly. The osseous structures are unremarkable. IMPRESSION: Hazy left perihilar airspace disease which may reflect atelectasis versus  pneumonia. Electronically Signed   By: Kathreen Devoid   On: 04/26/2018 11:47   Dg Esophagus  Result Date: 04/29/2018 CLINICAL DATA:  Dysphagia, episode difficulty breathing wheezing yesterday EXAM: ESOPHOGRAM/BARIUM SWALLOW TECHNIQUE: Single contrast examination was performed using thin barium. Patient also swallowed a 12.5 mm diameter barium tablet. FLUOROSCOPY TIME:  Fluoroscopy Time:  1 minutes 54 seconds Radiation Exposure Index (if provided by the fluoroscopic device): 35.3 mGy Number of Acquired Spot Images: multiple fluoroscopic screen captures COMPARISON:  None FINDINGS: Examination performed with patient supine and RPO with head of bed elevated 20 degrees. Grossly normal esophageal distention. No definite soft esophageal mass or stricture. Targeted rapid sequence imaging of the cervical esophagus and hypopharynx showed no laryngeal penetration or aspiration. No significant residuals. Significant diffuse impairment of esophageal motility with poor clearance of contrast from the thoracic esophagus with the patient elevated 20 degrees. Esophageal walls appear smooth without irregularity. 12.5 mm diameter barium tablet passed from oral cavity to the junction of the middle and distal thirds of the thoracic esophagus, did not pass more distally due primarily to severe motility issues in patient supine with head of bed elevated 20 degrees since; unable to assess diameter of the distal third of the thoracic esophagus with the barium tablet. IMPRESSION: Significant age-related esophageal dysmotility. No esophageal mass or stricture grossly identified. Electronically Signed   By: Lavonia Dana M.D.   On: 04/29/2018 15:36   US Venous Img Lower Bilateral  Result Date: 04/28/2018 CLINICAL DATA:  Pain and edema EXAM: BILATERAL LOWER EXTREMITY VENOUS DOPPLER ULTRASOUND TECHNIQUE: Gray-scale sonography with compression, as well as color and duplex ultrasound, were performed to evaluate the deep venous system from the  level of the common femoral vein through the popliteal and proximal calf veins. COMPARISON:  None FINDINGS: Normal compressibility of the common femoral, superficial femoral, and popliteal veins, as well as the proximal calf veins. No filling defects to suggest DVT on grayscale or color Doppler imaging. Doppler waveforms show normal direction of venous flow, normal respiratory phasicity and response to augmentation. 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 The left great saphenous vein is dilated distally, compressible. IMPRESSION: No evidence of  lower extremity deep vein thrombosis. Electronically Signed   By: Lucrezia Europe M.D.   On: 04/28/2018 14:33   Dg Chest Port 1 View  Result Date: 04/28/2018 CLINICAL DATA:  Respiratory distress EXAM: PORTABLE CHEST 1 VIEW COMPARISON:  April 26, 2018 FINDINGS: The mediastinal contour stable heart size is enlarged. The aorta is tortuous. There are increased pulmonary interstitium bilaterally. There is no focal pneumonia or pleural effusion. The visualized skeletal structures are stable. IMPRESSION: Mild congestive heart failure. Electronically Signed   By: Abelardo Diesel M.D.   On: 04/28/2018  13:20    Assessment/Plan  #1 hypokalemia-I suspect this is due to her diuretics she is currently on a total of 60 mg of Lasix a day divided into morning and afternoon doses she is on potassium 20 mEq a day will be more aggressive and give her 40 mEq this evening-also will give 40 mEq in the morning for the next 2 days and 20 mEq later in the day both days and then start Friday once a day dosing of 40 mEq a day will update a metabolic panel on Monday, October 28.  2.-History of CHF again recent echo showed improved ejection fraction of 60%- she is on Lasix 40 mg in a.m. 20 in the p.m. we have more aggressively supplementing the potassium update labs will be done early next week.  3.  History of aspiration pneumonitis this appears largely resolved status post Levaquin  and Flagyl and prednisone.  4.  History of chronic kidney disease- creatinine of 1.41 appears relatively baseline possibly on the upper end of baseline we will have this updated next week.  5.  Atrial fibrillation this appears rate controlled on Coreg she is not on any aggressive anticoagulation because of a history of GI bleed.  6.  History of anemia-again she does have a history of rectal bleeding did have acute diverticulitis previously as well as some hemorrhoid bleeding-this appears to have improved with the Anusol-she does have GI follow-up next month but family does not really wish aggressive measures We will start her on low-dose iron Clinically she appears stable does not complain of any increased weakness palpitations- again will monitor and start the iron.   #7 history of hypertension-hypotension?  She is on low-dose Coreg secondary to atrial fibrillation- occasionally  systolics in the 88P but she is asymptomatic and per family this is somewhat chronic with her-at this point will monitor she is not complaining of any dizziness headache or syncope  #8 history of chronic pain most often of her back- she continues on ultrasound and apparently this is fairly effective she also has Neurontin routinely 3 times a day   [id number]   Addendum-May 26, 2018--I did speak with her daughter today- she states her mother has an intolerance to iron apparently has GI upset- we will discontinue the iron-she has been followed by hematology and has had infusions by them- I suspect this will warrant follow-up by them-

## 2018-05-26 ENCOUNTER — Ambulatory Visit (HOSPITAL_COMMUNITY)
Admission: RE | Admit: 2018-05-26 | Discharge: 2018-05-26 | Disposition: A | Discharge status: Home / Self Care | Payer: Medicare Other | Source: Ambulatory Visit | Attending: Internal Medicine | Admitting: Internal Medicine

## 2018-05-26 ENCOUNTER — Other Ambulatory Visit: Payer: Self-pay

## 2018-05-26 ENCOUNTER — Ambulatory Visit (HOSPITAL_COMMUNITY): Payer: Medicare Other | Attending: Internal Medicine | Admitting: Speech Pathology

## 2018-05-26 ENCOUNTER — Other Ambulatory Visit (HOSPITAL_COMMUNITY)
Admission: RE | Admit: 2018-05-26 | Discharge: 2018-05-26 | Disposition: A | Discharge status: Home / Self Care | Payer: Medicare Other | Source: Skilled Nursing Facility | Attending: *Deleted | Admitting: *Deleted

## 2018-05-26 ENCOUNTER — Encounter (HOSPITAL_COMMUNITY): Payer: Self-pay | Admitting: Speech Pathology

## 2018-05-26 DIAGNOSIS — I482 Chronic atrial fibrillation, unspecified: Secondary | ICD-10-CM | POA: Insufficient documentation

## 2018-05-26 DIAGNOSIS — K219 Gastro-esophageal reflux disease without esophagitis: Secondary | ICD-10-CM | POA: Diagnosis not present

## 2018-05-26 DIAGNOSIS — R1310 Dysphagia, unspecified: Secondary | ICD-10-CM | POA: Diagnosis not present

## 2018-05-26 DIAGNOSIS — K224 Dyskinesia of esophagus: Secondary | ICD-10-CM | POA: Diagnosis not present

## 2018-05-26 DIAGNOSIS — R1312 Dysphagia, oropharyngeal phase: Secondary | ICD-10-CM

## 2018-05-26 DIAGNOSIS — R1319 Other dysphagia: Secondary | ICD-10-CM | POA: Insufficient documentation

## 2018-05-26 LAB — BASIC METABOLIC PANEL
Anion gap: 8 (ref 5–15)
BUN: 26 mg/dL — ABNORMAL HIGH (ref 8–23)
CO2: 30 mmol/L (ref 22–32)
CREATININE: 1.18 mg/dL — AB (ref 0.44–1.00)
Calcium: 7.7 mg/dL — ABNORMAL LOW (ref 8.9–10.3)
Chloride: 102 mmol/L (ref 98–111)
GFR calc non Af Amer: 39 mL/min — ABNORMAL LOW (ref >60)
GFR, EST AFRICAN AMERICAN: 45 mL/min — AB (ref >60)
Glucose, Bld: 87 mg/dL (ref 70–99)
Potassium: 3.8 mmol/L (ref 3.5–5.1)
SODIUM: 140 mmol/L (ref 135–145)

## 2018-05-26 LAB — CBC
HCT: 30.8 % — ABNORMAL LOW (ref 36.0–46.0)
Hemoglobin: 9 g/dL — ABNORMAL LOW (ref 12.0–15.0)
MCH: 27.6 pg (ref 26.0–34.0)
MCHC: 29.2 g/dL — ABNORMAL LOW (ref 30.0–36.0)
MCV: 94.5 fL (ref 80.0–100.0)
NRBC: 0 % (ref 0.0–0.2)
Platelets: 156 10*3/uL (ref 150–400)
RBC: 3.26 MIL/uL — ABNORMAL LOW (ref 3.87–5.11)
RDW: 17.2 % — ABNORMAL HIGH (ref 11.5–15.5)
WBC: 4.7 10*3/uL (ref 4.0–10.5)

## 2018-05-26 NOTE — Therapy (Signed)
[place], [place], [address] Phone: [phone]   Fax:  [phone]  Modified Barium Swallow  Patient Details  Name: [name] [name] MRN: [id number] Date of Birth: 06-30-1923 No data recorded  Encounter Date: 05/26/2018  End of Session - 05/26/18 1536    Visit Number  1    Number of Visits  1    Authorization Type  Medicare    SLP Start Time  8756    SLP Stop Time   1421    SLP Time Calculation (min)  36 min    Activity Tolerance  Patient tolerated treatment well       Past Medical History:  Diagnosis Date  . Aortic insufficiency   . Ascending aortic aneurysm ([place])    a. 4.6cm by CT 2018 - pt deferred any further evaluation/invasive management therefore follow-up deferred.  . Bladder infection    h/o  . CHF (congestive heart failure) ([place])   . Chronic atrial fibrillation    a. not on anticoagulation due to prior GIB  . CKD (chronic kidney disease), stage III ([place])   . Coronary artery disease    a. mild nonobstructive by cath 2018.  [place] Kitchen DCIS (ductal carcinoma in situ) of breast 03/14/2013   right breast  . Dehydration    HISTORY   . Fall at home 09/10/12  . Hemorrhoids   . Hip fracture ([place])    hip surgery 2001  . History of blood clots    in leg  . History of knee surgery   . Hypertension   . Iron deficiency anemia, unspecified 03/14/2013   secondary to GI blood loss  . Kidney infection    h/o  . Melanoma of skin ([place]) 03/14/2013  . Melanoma of thigh ([place])    left  . Mini stroke ([place])   . Mitral regurgitation   . Nonischemic cardiomyopathy ([place])    a. EF 35-40% by echo in 03/2017 with cath showing nonobstructive CAD, suspected stress-induced.  . S/P colonoscopy March 2010   RMR: friable anal canal hemorrhoids, hyperplastic ascending polyp, adenomatous descending polyp   . Stomach ulcer    secondary to h.pylori, s/p treatment  . Thyroid condition   . Tricuspid regurgitation   . Venous stasis     edema    Past Surgical History:  Procedure Laterality Date  . APPENDECTOMY  1942  . BREAST LUMPECTOMY  1998  . CARDIAC CATHETERIZATION    . CATARACT EXTRACTION, BILATERAL    . CHOLECYSTECTOMY    . COLONOSCOPY  06/05/11   pancolonic diverticulosis/ileal reosion/abnormal anorectal junction s/p biopsy: path for small intestine and Ti was benign with non-villous atrophy, rectal biopsy with prominent prolapse changes, no acute inflammation  . CORNEAL TRANSPLANT Bilateral 2013  . ENTEROSCOPY N/A 06/13/2013   EPP:IRJJ Gastritis/GI BLEED MOST LIKELY DUE TO DUODENAL ULCERS, AND ? AVMs  . ESOPHAGOGASTRODUODENOSCOPY  06/05/11   small hiatal hernia; + H.PYLORI GASTRITIS, s/p 5 days of Pylera, unable to finish due to N/V  . ESOPHAGOGASTRODUODENOSCOPY N/A 06/13/2013   Dr. Oneida Alar- see enteroscopy  . ESOPHAGOGASTRODUODENOSCOPY (EGD) WITH ESOPHAGEAL DILATION N/A 06/08/2013   OAC:ZYSAY hiatal hernia;  otherwise normal EGD s/p dilation  . GIVENS CAPSULE STUDY N/A 06/08/2013   Procedure: GIVENS CAPSULE STUDY;  Surgeon: Daneil Dolin, MD;  Location[facility];  Service: Endoscopy;  Laterality: N/A;  . HEMORRHOID BANDING    . KNEE SURGERY Right 05/2006   total right  . LEFT HEART  CATH AND CORONARY ANGIOGRAPHY N/A 03/18/2017   Procedure: LEFT HEART CATH AND CORONARY ANGIOGRAPHY;  Surgeon: Wellington Hampshire, MD;  Location: [facility];  Service: Cardiovascular;  Laterality: N/A;  . LEG SURGERY    . MELANOMA EXCISION Left 08/2006   left leg excision  . NM MYOCAR PERF WALL MOTION  03/27/2010   dipyridamole; small reversible basal to mid-septal defect (?artifact), post-stress EF 55%, low risk scan   . PARTIAL HYSTERECTOMY  1976  . TONSILLECTOMY    . TOTAL HIP ARTHROPLASTY Left 2001&2004    X 2 FOR LEFT HIP  . TRANSTHORACIC ECHOCARDIOGRAM  10/06/2012   EF 55-60%, mod eccentric hypertrophy, grade 2 diastolic dysfunction; mildly calcifed AV annulus with moderate regurg; aortic root mildly dilated; LA  severely dailted; PA peak pressure 17mHg  . VARICOSE VEIN SURGERY      There were no vitals filed for this visit.  Subjective Assessment - 05/26/18 1516    Subjective  "Can I have some water?"    Currently in Pain?  No/denies          General - 05/26/18 1518      General Information   Date of Onset  04/26/18    HPI  [name] [name] is a 84 year old female with a history of systolic CHF, CKD stage IV, chronic atrial fibrillation not on anticoagulation secondary to GI bleed, hypertension, GERD who presented from her primary care provider's office secondary to worsening lower extremity edema, shortness of breath, and orthopnea.  The patient visited the emergency department on 04/23/2018 with lower abdominal pain.  CT of the abdomen and pelvis at that time showed acute diverticulitis of the sigmoid colon.  CXR reveals: "Hazy left perihilar airspace disease which may reflect atelectasis versus pneumonia." Barium swallow/esophogram completed 9/26 revealing "Significant age-related esophageal dysmotility. No esophageal mass or stricture grossly identified." Also note MBSS completed in 2017 revealing oropharyngeal swallow essentially WNL. BSE was completed during acute stay last month and Pt was placed on NTL. She was referred by Dr. Veleta Miners for [facility] prior to discharging from [facility] to home.    Type of Study  MBS-Modified Barium Swallow Study    Previous Swallow Assessment  BSE and MBS 05/2016; Barium swallow 04/2018    Diet Prior to this Study  Regular;Nectar-thick liquids    Temperature Spikes Noted  No    Respiratory Status  Nasal cannula    History of Recent Intubation  No    Behavior/Cognition  Alert;Cooperative;Pleasant mood    Oral Cavity Assessment  Within Functional Limits    Oral Care Completed by SLP  No    Oral Cavity - Dentition  Dentures, bottom;Dentures, top    Vision  Functional for self feeding    Self-Feeding Abilities  Able to feed self    Patient Positioning  Upright in  chair    Baseline Vocal Quality  Normal    Volitional Cough  Strong    Volitional Swallow  Able to elicit    Anatomy  Within functional limits    Pharyngeal Secretions  Not observed secondary MBS         Oral Preparation/Oral Phase - 05/26/18 1523      Oral Preparation/Oral Phase   Oral Phase  Impaired      Oral - Thin   Oral - Thin Cup  Oral residue      Oral - Solids   Oral - Puree  Piecemeal swallowing    Oral - Regular  Piecemeal swallowing  Pharyngeal Phase - 05/26/18 1530      Pharyngeal Phase   Pharyngeal Phase  Impaired      Pharyngeal - Thin   Pharyngeal- Thin Cup  Swallow initiation at vallecula;Reduced tongue base retraction;Pharyngeal residue - valleculae;Lateral channel residue;Pharyngeal residue - pyriform    Pharyngeal- Thin Straw  Swallow initiation at vallecula;Reduced tongue base retraction;Pharyngeal residue - pyriform;Pharyngeal residue - valleculae;Lateral channel residue;Compensatory strategies attempted (with notebox)   dry swallow clears residuals     Pharyngeal - Solids   Pharyngeal- Puree  Reduced tongue base retraction;Pharyngeal residue - valleculae;Pharyngeal residue - pyriform    Pharyngeal- Regular  Reduced tongue base retraction;Pharyngeal residue - valleculae;Pharyngeal residue - pyriform    Pharyngeal- Pill  Within functional limits      Pharyngeal Phase - Comment   Pharyngeal Comment  Min vallecular and pyriform residuals were cleared with both spontaneous and cued dry swallow      Electrical Stimulation - Pharyngeal Phase   Was Electrical Stimulation Used  No       Cricopharyngeal Phase - 05/26/18 1534      Cervical Esophageal Phase   Cervical Esophageal Phase  Impaired      Cervical Esophageal Phase - Solids   Puree  Other (Comment)      Cervical Esophageal Phase - Comment   Other Esophageal Phase Observations  air-filled esophagus noted and delayed emptying of distal esophagus        Plan - 05/26/18 1537     Clinical Impression Statement  Pt presents with mild oropharyngeal phase dysphagia characterized by mild weak lingual manipulation and mastication resulting in piecemeal deglutition, swallow trigger at the level of the valleculae across consistencies and textures, reduced tongue base retraction resulting in min lingual, vallecular, lateral channel, and pyriform residuals after the primary swallow. These residuals were often cleared by spontaneous secondary swallow or cued dry swallow. No penetration or aspiration observed. Esophageal sweep revealed air in esophagus and some retention of barium contrast. Recommend regular tetxures with thin liquids when Pt is alert and upright, Pt to swallow 2x for each bite sip to clear residuals, po meds whole with water, and follow standard aspiration and reflux precautions.        Patient will benefit from skilled therapeutic intervention in order to improve the following deficits and impairments:   Dysphagia, oropharyngeal phase    Recommendations/Treatment - 05/26/18 1535      Swallow Evaluation Recommendations   SLP Diet Recommendations  Age appropriate regular;Thin    Liquid Administration via  Cup;Straw    Medication Administration  Whole meds with liquid    Supervision  Patient able to self feed    Compensations  Slow rate;Multiple dry swallows after each bite/sip    Postural Changes  Seated upright at 90 degrees;Remain upright for at least 30 minutes after feeds/meals         Problem List Patient Active Problem List   Diagnosis Date Noted  . Acute dyspnea   . Aspiration pneumonia of both lower lobes due to gastric secretions ([place]) 04/29/2018  . Acute diverticulitis   . Acute on chronic systolic CHF (congestive heart failure) ([place]) 04/28/2018  . CHF NYHA class III, acute on chronic, systolic ([place]) 16/05/9603  . Chronic pain syndrome 04/26/2018  . Diverticulitis large intestine 04/26/2018  . Chronic renal failure, stage 4 (severe) ([place])  04/26/2018  . Heart failure ([place]) 04/04/2017  . Coronary artery disease 04/02/2017  . Hypertension 04/02/2017  . Stress-induced cardiomyopathy 03/19/2017  . Non-ST elevation (NSTEMI)  myocardial infarction ([place])   . Chest pain 03/16/2017  . Atrial fibrillation, chronic 03/16/2017  . Elevated homocysteine ([place]) 01/26/2017  . Vitamin B12 deficiency 01/26/2017  . Dysphagia 05/20/2016  . Hypokalemia 05/19/2016  . Thrombocytopenia ([place]) 05/19/2016  . Acute respiratory failure with hypoxia ([place]) 05/18/2016  . Acute bronchitis 05/18/2016  . Bilateral lower extremity edema 05/18/2016  . Degenerative joint disease involving multiple joints 05/18/2016  . Pyuria 05/18/2016  . DNR (do not resuscitate) 05/18/2016  . Constipation 02/09/2015  . Occult GI bleeding 08/23/2013  . IDA (iron deficiency anemia) 06/07/2013  . Melanoma of skin ([place]) 03/14/2013  . DCIS (ductal carcinoma in situ) of breast 03/14/2013  . BPPV (benign paroxysmal positional vertigo) 02/10/2013  . RBBB 02/10/2013  . Ascending aortic aneurysm ([place]) 02/10/2013  . Aortic insufficiency 02/10/2013  . S/P total knee replacement 08/14/2011  . Helicobacter pylori gastritis 07/14/2011  . Anemia 02/04/2011  . Bladder infection   . [name], HIP 05/29/2009   Thank you,  Genene Churn, [place]  [facility] 05/26/2018, 3:49 PM  [place] [address] [place], [place], [address] Phone: [phone]   Fax:  [phone]  Name: [name] [name] MRN: [id number] Date of Birth: 1923-02-27

## 2018-05-27 ENCOUNTER — Other Ambulatory Visit: Payer: Self-pay | Admitting: *Deleted

## 2018-05-27 ENCOUNTER — Non-Acute Institutional Stay (SKILLED_NURSING_FACILITY): Payer: Medicare Other | Admitting: Internal Medicine

## 2018-05-27 ENCOUNTER — Encounter: Payer: Self-pay | Admitting: Internal Medicine

## 2018-05-27 DIAGNOSIS — I5033 Acute on chronic diastolic (congestive) heart failure: Secondary | ICD-10-CM | POA: Diagnosis not present

## 2018-05-27 DIAGNOSIS — K5732 Diverticulitis of large intestine without perforation or abscess without bleeding: Secondary | ICD-10-CM

## 2018-05-27 DIAGNOSIS — I482 Chronic atrial fibrillation, unspecified: Secondary | ICD-10-CM

## 2018-05-27 DIAGNOSIS — J690 Pneumonitis due to inhalation of food and vomit: Secondary | ICD-10-CM

## 2018-05-27 NOTE — Patient Outreach (Signed)
Spoke with patient daughter Joaquim Lai.  She states that patient is coming home on Saturday  She did not get a chance to review packet and at this time she does not feel they need [facility] but will keep information for future reference.  She states they had patient on list for services through [employer] and when she called to follow up patient name was not on list and now she is back at the bottom for CAP services.   She states patient will have home health and family support.  Plan to sign off but instructed daughter to be sure to contact RNCM or [facility] for any future questions or concerns. She agrees.  Royetta Crochet. Laymond Purser, RN, BSN, [place] [phone]) Business Cell  [phone]) Toll Free Office

## 2018-05-27 NOTE — Progress Notes (Signed)
Location:    [facility] Room Number: 153/P Place of Service:  SNF [phone])  Provider: Veleta Miners MD  PCP: [name], MD Patient Care Team: [name], MD as PCP - General (Family Medicine) Herminio Commons, MD as PCP - Cardiology (Cardiology) Gala Romney Cristopher Estimable, MD (Gastroenterology) Felicie Morn, MD (Inactive) as Consulting Physician (General Surgery)  Extended Emergency Contact Information Primary Emergency Contact: Stanfield,Teresa Address: [address], [place] [address] [place] Phone: [phone] Work Phone: [phone] Mobile Phone: [phone] Relation: Daughter Secondary Emergency Contact: Paschal,Frances Address: [address]          [place], [place] [address] [place] Phone: [phone] Mobile Phone: [phone] Relation: Daughter  Code Status: DNR Goals of care:  Advanced Directive information Advanced Directives 05/27/2018  Does Patient Have a Medical Advance Directive? Yes  Type of Advance Directive Out of facility DNR (pink MOST or yellow form)  Does patient want to make changes to medical advance directive? No - Patient declined  Copy of [place] in Chart? No - copy requested  Would patient like information on creating a medical advance directive? -  Pre-existing out of facility DNR order (yellow form or pink MOST form) -     Allergies  Allergen Reactions  . Codeine Diarrhea and Nausea Only  . Tape Other (See Comments)    SKIN IS VERY THIN AND TEARS AND BRUISES EASILY; PLEASE USE COBAN WRAP!!  . Diona Fanti [Aspirin] Rash and Other (See Comments)    Petechia   . Iron Nausea And Vomiting    Oral iron causes nausea and vomiting  . Penicillins Rash    Has patient had a PCN reaction causing immediate rash, facial/tongue/throat swelling, SOB or lightheadedness with hypotension: Yes Has patient had a PCN reaction causing severe rash involving  mucus membranes or skin necrosis: Unknown Has patient had a PCN reaction that required hospitalization: No Has patient had a PCN reaction occurring within the last 10 years: No If all of the above answers are "NO", then may proceed with Cephalosporin use.     Chief Complaint  Patient presents with  . Discharge Note    Discharge visit    HPI:  84 y.o. female seen today for Discharge   Patient was admitted  to SNF for therapy after Staying in the hospital from 09/23-09 /28 For Acute CHF and Aspiration Pneumonitis.  Patient has a history ofSystolicCHF,CKD stage IV, chronic atrial fibrillation not on any anticoagulation secondary to GI bleed, hypertension, and GERD.   She was initially seen in ED on 09 /20 with lower abdominal pain. CT of the abdomen and pelvis showed acute diverticulitis of sigmoid colon. Patient was sent home on Cipro and Flagyl. Patient became increasingly confused andfamily stopped her antibiotics. She was seen byher PCP on 9/23 and was found to be short of breath.   She was admitted with CHF . She was treated with IV Diuretics . She also was treated for Aspiration Pneumonitis with ABX and Steroids. Patient was admitted to  SNF for therapy. She did well with therapy and is walking with the [place] [facility] [place]. Her Diet was upgraded to Thin Liquids. She did loose weight but then stabilized around 150 lbs. Continues to need Oxygen She is going home with help from her daughters and Caregiver  Past Medical History:  Diagnosis Date  . Aortic insufficiency   .  Ascending aortic aneurysm ([place])    a. 4.6cm by CT 2018 - pt deferred any further evaluation/invasive management therefore follow-up deferred.  . Bladder infection    h/o  . CHF (congestive heart failure) ([place])   . Chronic atrial fibrillation    a. not on anticoagulation due to prior GIB  . CKD (chronic kidney disease), stage III ([place])   . Coronary artery disease    a. mild nonobstructive by  cath 2018.  Marland Kitchen DCIS (ductal carcinoma in situ) of breast 03/14/2013   right breast  . Dehydration    HISTORY   . Fall at home 09/10/12  . Hemorrhoids   . Hip fracture ([place])    hip surgery 2001  . History of blood clots    in leg  . History of knee surgery   . Hypertension   . Iron deficiency anemia, unspecified 03/14/2013   secondary to GI blood loss  . Kidney infection    h/o  . Melanoma of skin ([place]) 03/14/2013  . Melanoma of thigh ([place])    left  . Mini stroke ([place])   . Mitral regurgitation   . Nonischemic cardiomyopathy ([place])    a. EF 35-40% by echo in 03/2017 with cath showing nonobstructive CAD, suspected stress-induced.  . S/P colonoscopy March 2010   RMR: friable anal canal hemorrhoids, hyperplastic ascending polyp, adenomatous descending polyp   . Stomach ulcer    secondary to h.pylori, s/p treatment  . Thyroid condition   . Tricuspid regurgitation   . Venous stasis    edema    Past Surgical History:  Procedure Laterality Date  . APPENDECTOMY  1942  . BREAST LUMPECTOMY  1998  . CARDIAC CATHETERIZATION    . CATARACT EXTRACTION, BILATERAL    . CHOLECYSTECTOMY    . COLONOSCOPY  06/05/11   pancolonic diverticulosis/ileal reosion/abnormal anorectal junction s/p biopsy: path for small intestine and Ti was benign with non-villous atrophy, rectal biopsy with prominent prolapse changes, no acute inflammation  . CORNEAL TRANSPLANT Bilateral 2013  . ENTEROSCOPY N/A 06/13/2013   UXN:ATFT Gastritis/GI BLEED MOST LIKELY DUE TO DUODENAL ULCERS, AND ? AVMs  . ESOPHAGOGASTRODUODENOSCOPY  06/05/11   small hiatal hernia; + H.PYLORI GASTRITIS, s/p 5 days of Pylera, unable to finish due to N/V  . ESOPHAGOGASTRODUODENOSCOPY N/A 06/13/2013   Dr. Oneida Alar- see enteroscopy  . ESOPHAGOGASTRODUODENOSCOPY (EGD) WITH ESOPHAGEAL DILATION N/A 06/08/2013   DDU:KGURK hiatal hernia;  otherwise normal EGD s/p dilation  . GIVENS CAPSULE STUDY N/A 06/08/2013   Procedure: GIVENS CAPSULE STUDY;  Surgeon:  Daneil Dolin, MD;  [facility];  Service: Endoscopy;  Laterality: N/A;  . HEMORRHOID BANDING    . KNEE SURGERY Right 05/2006   total right  . LEFT HEART CATH AND CORONARY ANGIOGRAPHY N/A 03/18/2017   Procedure: LEFT HEART CATH AND CORONARY ANGIOGRAPHY;  Surgeon: Wellington Hampshire, MD;  Location: [facility] LAB;  Service: Cardiovascular;  Laterality: N/A;  . LEG SURGERY    . MELANOMA EXCISION Left 08/2006   left leg excision  . NM MYOCAR PERF WALL MOTION  03/27/2010   dipyridamole; small reversible basal to mid-septal defect (?artifact), post-stress EF 55%, low risk scan   . PARTIAL HYSTERECTOMY  1976  . TONSILLECTOMY    . TOTAL HIP ARTHROPLASTY Left 2001&2004    X 2 FOR LEFT HIP  . TRANSTHORACIC ECHOCARDIOGRAM  10/06/2012   EF 55-60%, mod eccentric hypertrophy, grade 2 diastolic dysfunction; mildly calcifed AV annulus with moderate regurg; aortic root mildly dilated;  LA severely dailted; PA peak pressure 60mHg  . VARICOSE VEIN SURGERY        reports that she quit smoking about 42 years ago. Her smoking use included cigarettes. She has a 30.00 pack-year smoking history. She has never used smokeless tobacco. She reports that she does not drink alcohol or use drugs. Social History   Socioeconomic History  . Marital status: Widowed    Spouse name: Not on file  . Number of children: 5  . Years of education: 6  . Highest education level: Not on file  Occupational History    Employer: RETIRED  Social Needs  . Financial resource strain: Not on file  . Food insecurity:    Worry: Not on file    Inability: Not on file  . Transportation needs:    Medical: Not on file    Non-medical: Not on file  Tobacco Use  . Smoking status: Former Smoker    Packs/day: 1.50    Years: 20.00    Pack years: 30.00    Types: Cigarettes    Last attempt to quit: 07/05/1975    Years since quitting: 42.9  . Smokeless tobacco: Never Used  . Tobacco comment: Quit smoking in 1975  Substance and  Sexual Activity  . Alcohol use: No  . Drug use: No  . Sexual activity: Never  Lifestyle  . Physical activity:    Days per week: Not on file    Minutes per session: Not on file  . Stress: Not on file  Relationships  . Social connections:    Talks on phone: Not on file    Gets together: Not on file    Attends religious service: Not on file    Active member of club or organization: Not on file    Attends meetings of clubs or organizations: Not on file    Relationship status: Not on file  . Intimate partner violence:    Fear of current or ex partner: Not on file    Emotionally abused: Not on file    Physically abused: Not on file    Forced sexual activity: Not on file  Other Topics Concern  . Not on file  Social History Narrative  . Not on file   Functional Status Survey:    Allergies  Allergen Reactions  . Codeine Diarrhea and Nausea Only  . Tape Other (See Comments)    SKIN IS VERY THIN AND TEARS AND BRUISES EASILY; PLEASE USE COBAN WRAP!!  . Diona Fanti [Aspirin] Rash and Other (See Comments)    Petechia   . Iron Nausea And Vomiting    Oral iron causes nausea and vomiting  . Penicillins Rash    Has patient had a PCN reaction causing immediate rash, facial/tongue/throat swelling, SOB or lightheadedness with hypotension: Yes Has patient had a PCN reaction causing severe rash involving mucus membranes or skin necrosis: Unknown Has patient had a PCN reaction that required hospitalization: No Has patient had a PCN reaction occurring within the last 10 years: No If all of the above answers are "NO", then may proceed with Cephalosporin use.     Pertinent  Health Maintenance Due  Topic Date Due  . INFLUENZA VACCINE  Completed  . DEXA SCAN  Completed  . PNA vac Low Risk Adult  Completed    Medications: Outpatient Encounter Medications as of 05/27/2018  Medication Sig  . carvedilol (COREG) 3.125 MG tablet TAKE 1 TABLET BY MOUTH TWO  TIMES DAILY WITH MEALS  . clopidogrel  (  PLAVIX) 75 MG tablet Take 1 tablet (75 mg total) by mouth daily with breakfast.  . cyanocobalamin 1000 MCG tablet Take 1,000 mcg by mouth daily.  Marland Kitchen docusate sodium (COLACE) 100 MG capsule Take 200 mg by mouth 2 (two) times daily.  . folic acid (FOLVITE) 1 MG tablet TAKE 1 TABLET BY MOUTH EVERY DAY  . furosemide (LASIX) 20 MG tablet 04/09/18 dose decreased to 40 mg am and 20 mg pm  . gabapentin (NEURONTIN) 100 MG capsule TAKE 2 CAPSULES BY MOUTH 3  TIMES DAILY  . hydrocortisone (ANUSOL-HC) 25 MG suppository Place 1 suppository (25 mg total) rectally every 12 (twelve) hours.  Marland Kitchen ipratropium-albuterol (DUONEB) 0.5-2.5 (3) MG/3ML SOLN Take 3 mLs by nebulization every 6 (six) hours as needed (sob, wheeze).  . mometasone (ELOCON) 0.1 % cream Apply 1 application topically daily.  . ondansetron (ZOFRAN) 4 MG tablet TAKE 1 TABLET BY MOUTH  EVERY 8 HOURS AS NEEDED FOR NAUSEA AND VOMITING  . pantoprazole (PROTONIX) 40 MG tablet Take 1 tablet (40 mg total) by mouth 2 (two) times daily.  . potassium chloride SA (K-DUR,KLOR-CON) 20 MEQ tablet Take 40 mEq by mouth daily. Take from 05/26/2018-10/24/209  . potassium chloride SA (K-DUR,KLOR-CON) 20 MEQ tablet Take 20 mEq by mouth every evening. Take once on 05/27/2018  . potassium chloride SA (K-DUR,KLOR-CON) 20 MEQ tablet Take 40 mEq by mouth daily. Start on 05/28/2018  . prednisoLONE acetate (PRED FORTE) 1 % ophthalmic suspension Place 1 drop into both eyes 4 (four) times daily.   . sodium chloride (MURO 128) 2 % ophthalmic solution Place 1 drop into both eyes 3 (three) times daily.   . traMADol-acetaminophen (ULTRACET) 37.5-325 MG tablet Take 1 tablet by mouth every 4 (four) hours as needed. for pain  . [DISCONTINUED] potassium chloride SA (K-DUR,KLOR-CON) 20 MEQ tablet TAKE 1 TABLET BY MOUTH  DAILY (Patient taking differently: Take 40 mEq by mouth daily. )   No facility-administered encounter medications on file as of 05/27/2018.      Review of  Systems  Vitals:   05/27/18 1437  BP: (!) 90/56  Pulse: (!) 57  Resp: 17  Temp: (!) 95.6 F (35.3 C)  TempSrc: Oral  SpO2: 98%   There is no height or weight on file to calculate BMI. Physical Exam  Constitutional: She appears well-developed and well-nourished.  HENT:  Head: Normocephalic.  Mouth/Throat: Oropharynx is clear and moist.  Eyes: Pupils are equal, round, and reactive to light.  Neck: Neck supple.  Cardiovascular: Normal rate and regular rhythm.  Murmur heard. Pulmonary/Chest: Effort normal. No stridor. No respiratory distress. She has no wheezes.  Rales Bilateral  Abdominal: Soft. Bowel sounds are normal. She exhibits no distension. There is no tenderness. There is no guarding.  Musculoskeletal:  Mild edema Bilateral  Neurological: She is alert.  Did not knew the year but knew everything else. Has good strength on all extremities. Little Weak in Right LE  Skin: Skin is warm and dry.  Psychiatric: She has a normal mood and affect. Her behavior is normal. Thought content normal.      Labs reviewed: Basic Metabolic Panel: Recent Labs    04/29/18 0424  05/17/18 0300 05/25/18 0408 05/26/18 0525  NA 138   < > 138 139 140  K 3.7   < > 4.2 3.1* 3.8  CL 99   < > 101 100 102  CO2 27   < > 34* 32 30  GLUCOSE 105*   < > 86 100*  87  BUN 25*   < > 26* 29* 26*  CREATININE 1.37*   < > 1.20* 1.41* 1.18*  CALCIUM 7.8*   < > 7.7* 7.7* 7.7*  MG 2.2  --   --   --   --    < > = values in this interval not displayed.   Liver Function Tests: Recent Labs    04/23/18 1106 04/26/18 1114 04/28/18 1341  AST 14* 24 27  ALT 9 12 14   ALKPHOS 61 64 71  BILITOT 0.9 0.8 1.0  PROT 6.7 6.7 6.6  ALBUMIN 3.1* 3.1* 3.2*   Recent Labs    04/23/18 1106  LIPASE 24   No results for input(s): AMMONIA in the last 8760 hours. CBC: Recent Labs    04/26/18 1114  05/03/18 0600  05/15/18 0700 05/25/18 0408 05/26/18 0525  WBC 6.9   < > 7.8   < > 6.2 5.3 4.7  NEUTROABS 5.5   --  5.4  --  3.9  --   --   HGB 9.3*   < > 9.9*   < > 9.3* 8.5* 9.0*  HCT 30.8*   < > 33.5*   < > 32.4* 29.0* 30.8*  MCV 95.1   < > 95.4   < > 94.7 94.2 94.5  PLT 217   < > 261   < > 200 168 156   < > = values in this interval not displayed.   Cardiac Enzymes: Recent Labs    04/27/18 0548 04/28/18 1341 04/28/18 1857  TROPONINI <0.03 <0.03 0.11*   BNP: Invalid input(s): POCBNP CBG: Recent Labs    04/28/18 1303  GLUCAP 104*    Procedures and Imaging Studies During Stay: Dg Op Swallowing Func-medicare/speech Path  Result Date: 05/26/2018 [facility] [address] [place], [place], [address] Phone: [phone]   Fax:  [phone] Modified Barium Swallow Patient Details Name: CHERRI YERA MRN: [id number] Date of Birth: 1922/10/28 No data recorded Encounter Date: 05/26/2018 End of Session - 05/26/18 1536   Visit Number  1   Number of Visits  1   Authorization Type  Medicare   SLP Start Time  7106   SLP Stop Time   1421   SLP Time Calculation (min)  36 min   Activity Tolerance  Patient tolerated treatment well   Past Medical History: Diagnosis Date . Aortic insufficiency  . Ascending aortic aneurysm ([place])   a. 4.6cm by CT 2018 - pt deferred any further evaluation/invasive management therefore follow-up deferred. . Bladder infection   h/o . CHF (congestive heart failure) ([place])  . Chronic atrial fibrillation   a. not on anticoagulation due to prior GIB . CKD (chronic kidney disease), stage III ([place])  . Coronary artery disease   a. mild nonobstructive by cath 2018. Marland Kitchen DCIS (ductal carcinoma in situ) of breast 03/14/2013  right breast . Dehydration   HISTORY  . Fall at home 09/10/12 . Hemorrhoids  . Hip fracture ([place])   hip surgery 2001 . History of blood clots   in leg . History of knee surgery  . Hypertension  . Iron deficiency anemia, unspecified 03/14/2013  secondary to GI blood loss . Kidney infection   h/o . Melanoma of skin ([place]) 03/14/2013 . Melanoma of  thigh ([place])   left . Mini stroke ([place])  . Mitral regurgitation  . Nonischemic cardiomyopathy ([place])   a. EF 35-40% by echo in 03/2017 with cath showing nonobstructive CAD, suspected stress-induced. Marland Kitchen  S/P colonoscopy March 2010  RMR: friable anal canal hemorrhoids, hyperplastic ascending polyp, adenomatous descending polyp  . Stomach ulcer   secondary to h.pylori, s/p treatment . Thyroid condition  . Tricuspid regurgitation  . Venous stasis   edema Past Surgical History: Procedure Laterality Date . APPENDECTOMY  1942 . BREAST LUMPECTOMY  1998 . CARDIAC CATHETERIZATION   . CATARACT EXTRACTION, BILATERAL   . CHOLECYSTECTOMY   . COLONOSCOPY  06/05/11  pancolonic diverticulosis/ileal reosion/abnormal anorectal junction s/p biopsy: path for small intestine and Ti was benign with non-villous atrophy, rectal biopsy with prominent prolapse changes, no acute inflammation . CORNEAL TRANSPLANT Bilateral 2013 . ENTEROSCOPY N/A 06/13/2013  YOV:ZCHY Gastritis/GI BLEED MOST LIKELY DUE TO DUODENAL ULCERS, AND ? AVMs . ESOPHAGOGASTRODUODENOSCOPY  06/05/11  small hiatal hernia; + H.PYLORI GASTRITIS, s/p 5 days of Pylera, unable to finish due to N/V . ESOPHAGOGASTRODUODENOSCOPY N/A 06/13/2013  Dr. Oneida Alar- see enteroscopy . ESOPHAGOGASTRODUODENOSCOPY (EGD) WITH ESOPHAGEAL DILATION N/A 06/08/2013  IFO:YDXAJ hiatal hernia;  otherwise normal EGD s/p dilation . GIVENS CAPSULE STUDY N/A 06/08/2013  Procedure: GIVENS CAPSULE STUDY;  Surgeon: Daneil Dolin, MD;  [facility];  Service: Endoscopy;  Laterality: N/A; . HEMORRHOID BANDING   . KNEE SURGERY Right 05/2006  total right . LEFT HEART CATH AND CORONARY ANGIOGRAPHY N/A 03/18/2017  Procedure: LEFT HEART CATH AND CORONARY ANGIOGRAPHY;  Surgeon: Wellington Hampshire, MD;  Location: [facility] LAB;  Service: Cardiovascular;  Laterality: N/A; . LEG SURGERY   . MELANOMA EXCISION Left 08/2006  left leg excision . NM MYOCAR PERF WALL MOTION  03/27/2010  dipyridamole; small reversible basal  to mid-septal defect (?artifact), post-stress EF 55%, low risk scan  . PARTIAL HYSTERECTOMY  1976 . TONSILLECTOMY   . TOTAL HIP ARTHROPLASTY Left 2001&2004   X 2 FOR LEFT HIP . TRANSTHORACIC ECHOCARDIOGRAM  10/06/2012  EF 55-60%, mod eccentric hypertrophy, grade 2 diastolic dysfunction; mildly calcifed AV annulus with moderate regurg; aortic root mildly dilated; LA severely dailted; PA peak pressure 64mHg . VARICOSE VEIN SURGERY   There were no vitals filed for this visit. Subjective Assessment - 05/26/18 1516   Subjective  "Can I have some water?"   Currently in Pain?  No/denies   General - 05/26/18 1518    General Information  Date of Onset  04/26/18   HPI  Reigan Esteve is a 84 year old female with a history of systolic CHF, CKD stage IV, chronic atrial fibrillation not on anticoagulation secondary to GI bleed, hypertension, GERD who presented from her primary care provider's office secondary to worsening lower extremity edema, shortness of breath, and orthopnea.  The patient visited the emergency department on 04/23/2018 with lower abdominal pain.  CT of the abdomen and pelvis at that time showed acute diverticulitis of the sigmoid colon.  CXR reveals: "Hazy left perihilar airspace disease which may reflect atelectasis versus pneumonia." Barium swallow/esophogram completed 9/26 revealing "Significant age-related esophageal dysmotility. No esophageal mass or stricture grossly identified." Also note MBSS completed in 2017 revealing oropharyngeal swallow essentially WNL. BSE was completed during acute stay last month and Pt was placed on NTL. She was referred by Dr. Veleta Miners for [facility] prior to discharging from [facility] home.   Type of Study  MBS-Modified Barium Swallow Study   Previous Swallow Assessment  BSE and MBS 05/2016; Barium swallow 04/2018   Diet Prior to this Study  Regular;Nectar-thick liquids   Temperature Spikes Noted  No   Respiratory Status  Nasal cannula   History of Recent Intubation  No    Behavior/Cognition  Alert;Cooperative;Pleasant mood   Oral Cavity Assessment  Within Functional Limits   Oral Care Completed by SLP  No   Oral Cavity - Dentition  Dentures, bottom;Dentures, top   Vision  Functional for self feeding   Self-Feeding Abilities  Able to feed self   Patient Positioning  Upright in chair   Baseline Vocal Quality  Normal   Volitional Cough  Strong   Volitional Swallow  Able to elicit   Anatomy  Within functional limits   Pharyngeal Secretions  Not observed secondary MBS   Oral Preparation/Oral Phase - 05/26/18 1523    Oral Preparation/Oral Phase  Oral Phase  Impaired    Oral - Thin  Oral - Thin Cup  Oral residue    Oral - Solids  Oral - Puree  Piecemeal swallowing   Oral - Regular  Piecemeal swallowing   Pharyngeal Phase - 05/26/18 1530    Pharyngeal Phase  Pharyngeal Phase  Impaired    Pharyngeal - Thin  Pharyngeal- Thin Cup  Swallow initiation at vallecula;Reduced tongue base retraction;Pharyngeal residue - valleculae;Lateral channel residue;Pharyngeal residue - pyriform   Pharyngeal- Thin Straw  Swallow initiation at vallecula;Reduced tongue base retraction;Pharyngeal residue - pyriform;Pharyngeal residue - valleculae;Lateral channel residue;Compensatory strategies attempted (with notebox)  dry swallow clears residuals   Pharyngeal - Solids  Pharyngeal- Puree  Reduced tongue base retraction;Pharyngeal residue - valleculae;Pharyngeal residue - pyriform   Pharyngeal- Regular  Reduced tongue base retraction;Pharyngeal residue - valleculae;Pharyngeal residue - pyriform   Pharyngeal- Pill  Within functional limits    Pharyngeal Phase - Comment  Pharyngeal Comment  Min vallecular and pyriform residuals were cleared with both spontaneous and cued dry swallow    Electrical Stimulation - Pharyngeal Phase  Was Electrical Stimulation Used  No   Cricopharyngeal Phase - 05/26/18 1534    Cervical Esophageal Phase  Cervical Esophageal Phase  Impaired    Cervical Esophageal Phase - Solids  Puree  Other  (Comment)    Cervical Esophageal Phase - Comment  Other Esophageal Phase Observations  air-filled esophagus noted and delayed emptying of distal esophagus   Plan - 05/26/18 1537   Clinical Impression Statement  Pt presents with mild oropharyngeal phase dysphagia characterized by mild weak lingual manipulation and mastication resulting in piecemeal deglutition, swallow trigger at the level of the valleculae across consistencies and textures, reduced tongue base retraction resulting in min lingual, vallecular, lateral channel, and pyriform residuals after the primary swallow. These residuals were often cleared by spontaneous secondary swallow or cued dry swallow. No penetration or aspiration observed. Esophageal sweep revealed air in esophagus and some retention of barium contrast. Recommend regular tetxures with thin liquids when Pt is alert and upright, Pt to swallow 2x for each bite sip to clear residuals, po meds whole with water, and follow standard aspiration and reflux precautions.    Patient will benefit from skilled therapeutic intervention in order to improve the following deficits and impairments:  Dysphagia, oropharyngeal phase Recommendations/Treatment - 05/26/18 1535    Swallow Evaluation Recommendations  SLP Diet Recommendations  Age appropriate regular;Thin   Liquid Administration via  Cup;Straw   Medication Administration  Whole meds with liquid   Supervision  Patient able to self feed   Compensations  Slow rate;Multiple dry swallows after each bite/sip   Postural Changes  Seated upright at 90 degrees;Remain upright for at least 30 minutes after feeds/meals   Problem List Patient Active Problem List  Diagnosis Date Noted . Acute dyspnea  .  Aspiration pneumonia of both lower lobes due to gastric secretions ([place]) 04/29/2018 . Acute diverticulitis  . Acute on chronic systolic CHF (congestive heart failure) ([place]) 04/28/2018 . CHF NYHA class III, acute on chronic, systolic ([place]) 27/74/1287 . Chronic pain  syndrome 04/26/2018 . Diverticulitis large intestine 04/26/2018 . Chronic renal failure, stage 4 (severe) ([place]) 04/26/2018 . Heart failure ([place]) 04/04/2017 . Coronary artery disease 04/02/2017 . Hypertension 04/02/2017 . Stress-induced cardiomyopathy 03/19/2017 . Non-ST elevation (NSTEMI) myocardial infarction ([place])  . Chest pain 03/16/2017 . Atrial fibrillation, chronic 03/16/2017 . Elevated homocysteine ([place]) 01/26/2017 . Vitamin B12 deficiency 01/26/2017 . Dysphagia 05/20/2016 . Hypokalemia 05/19/2016 . Thrombocytopenia ([place]) 05/19/2016 . Acute respiratory failure with hypoxia ([place]) 05/18/2016 . Acute bronchitis 05/18/2016 . Bilateral lower extremity edema 05/18/2016 . Degenerative joint disease involving multiple joints 05/18/2016 . Pyuria 05/18/2016 . DNR (do not resuscitate) 05/18/2016 . Constipation 02/09/2015 . Occult GI bleeding 08/23/2013 . IDA (iron deficiency anemia) 06/07/2013 . Melanoma of skin ([place]) 03/14/2013 . DCIS (ductal carcinoma in situ) of breast 03/14/2013 . BPPV (benign paroxysmal positional vertigo) 02/10/2013 . RBBB 02/10/2013 . Ascending aortic aneurysm ([place]) 02/10/2013 . Aortic insufficiency 02/10/2013 . S/P total knee replacement 08/14/2011 . Helicobacter pylori gastritis 07/14/2011 . Anemia 02/04/2011 . Bladder infection  . [name], HIP 05/29/2009 Thank you, Genene Churn, [place] [facility] 05/26/2018, 3:49 PM [place], [place], [address] Phone: [phone]   Fax:  [phone] Name: BRENLYN BESHARA MRN: [id number] Date of Birth: 11/25/1922 CLINICAL DATA:  Dysphagia, esophageal dysmotility EXAM: MODIFIED BARIUM SWALLOW TECHNIQUE: Different consistencies of barium were administered orally to the patient by the Speech Pathologist. Imaging of the pharynx was performed in the lateral projection. FLUOROSCOPY TIME:  Fluoroscopy Time:  2 minutes 18 seconds Radiation Exposure Index (if provided by the fluoroscopic  device): 7.0 mGy Number of Acquired Spot Images: multiple fluoroscopic screen captures COMPARISON:  Esophagram 04/29/2018 FINDINGS: Thin liquid- within normal limits. No laryngeal penetration or aspiration. Nectar thick liquid-not evaluated Honey-not evaluated Pure-with applesauce consistency, no laryngeal penetration or aspiration. Minimal vallecular residuals. Cracker-within normal limits Pure with cracker-not evaluated Barium tablet-swallowed a 12.5 mm diameter barium tablet with thin barium. No abnormalities identified. IMPRESSION: No significant swallowing abnormalities identified. Please refer to the Speech Pathologists report for complete details and recommendations. Electronically Signed   By: Lavonia Dana M.D.   On: 05/26/2018 15:44   Dg Esophagus  Result Date: 04/29/2018 CLINICAL DATA:  Dysphagia, episode difficulty breathing wheezing yesterday EXAM: ESOPHOGRAM/BARIUM SWALLOW TECHNIQUE: Single contrast examination was performed using thin barium. Patient also swallowed a 12.5 mm diameter barium tablet. FLUOROSCOPY TIME:  Fluoroscopy Time:  1 minutes 54 seconds Radiation Exposure Index (if provided by the fluoroscopic device): 35.3 mGy Number of Acquired Spot Images: multiple fluoroscopic screen captures COMPARISON:  None FINDINGS: Examination performed with patient supine and RPO with head of bed elevated 20 degrees. Grossly normal esophageal distention. No definite soft esophageal mass or stricture. Targeted rapid sequence imaging of the cervical esophagus and hypopharynx showed no laryngeal penetration or aspiration. No significant residuals. Significant diffuse impairment of esophageal motility with poor clearance of contrast from the thoracic esophagus with the patient elevated 20 degrees. Esophageal walls appear smooth without irregularity. 12.5 mm diameter barium tablet passed from oral cavity to the junction of the middle and distal thirds of the thoracic esophagus, did not pass more distally due  primarily to severe motility issues in patient supine with head of bed elevated 20 degrees since; unable to  assess diameter of the distal third of the thoracic esophagus with the barium tablet. IMPRESSION: Significant age-related esophageal dysmotility. No esophageal mass or stricture grossly identified. Electronically Signed   By: Lavonia Dana M.D.   On: 04/29/2018 15:36   US Venous Img Lower Bilateral  Result Date: 04/28/2018 CLINICAL DATA:  Pain and edema EXAM: BILATERAL LOWER EXTREMITY VENOUS DOPPLER ULTRASOUND TECHNIQUE: Gray-scale sonography with compression, as well as color and duplex ultrasound, were performed to evaluate the deep venous system from the level of the common femoral vein through the popliteal and proximal calf veins. COMPARISON:  None FINDINGS: Normal compressibility of the common femoral, superficial femoral, and popliteal veins, as well as the proximal calf veins. No filling defects to suggest DVT on grayscale or color Doppler imaging. Doppler waveforms show normal direction of venous flow, normal respiratory phasicity and response to augmentation. 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 The left great saphenous vein is dilated distally, compressible. IMPRESSION: No evidence of  lower extremity deep vein thrombosis. Electronically Signed   By: Lucrezia Europe M.D.   On: 04/28/2018 14:33   Dg Chest Port 1 View  Result Date: 04/28/2018 CLINICAL DATA:  Respiratory distress EXAM: PORTABLE CHEST 1 VIEW COMPARISON:  April 26, 2018 FINDINGS: The mediastinal contour stable heart size is enlarged. The aorta is tortuous. There are increased pulmonary interstitium bilaterally. There is no focal pneumonia or pleural effusion. The visualized skeletal structures are stable. IMPRESSION: Mild congestive heart failure. Electronically Signed   By: Abelardo Diesel M.D.   On: 04/28/2018 13:20    Assessment/Plan:   Acute on chronic systolic CHF (congestive heart failure) Repeat Echo actually  shows improvement in her EF of 60% She did well with increased dose of Lasix and her Renal function stayed stable Unable to taper oxygen  Will be discharged with oxygen Atrial fibrillation, chronic Will discontinue Coreg due to Bradycardia and hypotension Follow up with Cardiology Not on any anticoagulation Continue Plavix  Aspiration pneumonia Patient finished Levaquin and Flagyl And Also done with  prednisone Taper.  Oropharyngeal dysphagia Was evaluated by Barium swallow and Bedside eval by Speech. She has Esophageal Immobility due to her Age and also Aspiration They Did upgrade her Diet to Regular with Thin Liquids   Acute diverticulitis and recent Rectal Bleed Finished  Levaquin and Flagyl Asymptomatic now Was diagnosed with CT scan on 09/20 Was also started on Anusol . No more bleeding. Hgb Stable Patient family does not want any aggressive work up. She has follow up with Dr Gala Romney next month  Chronic renal failure, stage 4 (severe) Creat at baseline  Anemia, Iron studies and B12 was Normal Hgb Stable . No Aggressive work up by Family Right Hip Pain with Lower Back pain Seems Chronic Continue Ultram PRN and Neurontin Low Calcium Will start her on Calcium and Vit D supplement Discharge  Patient will go home with help from her daughter.and her daughters. She is walking with Walker and Mild Assist.  Future labs/tests needed:   Repeat BMP and CBC in 2 weeks

## 2018-05-29 ENCOUNTER — Other Ambulatory Visit: Payer: Self-pay | Admitting: Family Medicine

## 2018-05-30 DIAGNOSIS — Z79891 Long term (current) use of opiate analgesic: Secondary | ICD-10-CM | POA: Diagnosis not present

## 2018-05-30 DIAGNOSIS — Z85820 Personal history of malignant melanoma of skin: Secondary | ICD-10-CM | POA: Diagnosis not present

## 2018-05-30 DIAGNOSIS — M25551 Pain in right hip: Secondary | ICD-10-CM | POA: Diagnosis not present

## 2018-05-30 DIAGNOSIS — I428 Other cardiomyopathies: Secondary | ICD-10-CM | POA: Diagnosis not present

## 2018-05-30 DIAGNOSIS — I2510 Atherosclerotic heart disease of native coronary artery without angina pectoris: Secondary | ICD-10-CM | POA: Diagnosis not present

## 2018-05-30 DIAGNOSIS — D638 Anemia in other chronic diseases classified elsewhere: Secondary | ICD-10-CM | POA: Diagnosis not present

## 2018-05-30 DIAGNOSIS — I083 Combined rheumatic disorders of mitral, aortic and tricuspid valves: Secondary | ICD-10-CM | POA: Diagnosis not present

## 2018-05-30 DIAGNOSIS — Z86000 Personal history of in-situ neoplasm of breast: Secondary | ICD-10-CM | POA: Diagnosis not present

## 2018-05-30 DIAGNOSIS — Z87891 Personal history of nicotine dependence: Secondary | ICD-10-CM | POA: Diagnosis not present

## 2018-05-30 DIAGNOSIS — I712 Thoracic aortic aneurysm, without rupture: Secondary | ICD-10-CM | POA: Diagnosis not present

## 2018-05-30 DIAGNOSIS — I130 Hypertensive heart and chronic kidney disease with heart failure and stage 1 through stage 4 chronic kidney disease, or unspecified chronic kidney disease: Secondary | ICD-10-CM | POA: Diagnosis not present

## 2018-05-30 DIAGNOSIS — I482 Chronic atrial fibrillation, unspecified: Secondary | ICD-10-CM | POA: Diagnosis not present

## 2018-05-30 DIAGNOSIS — Z86718 Personal history of other venous thrombosis and embolism: Secondary | ICD-10-CM | POA: Diagnosis not present

## 2018-05-30 DIAGNOSIS — Z9981 Dependence on supplemental oxygen: Secondary | ICD-10-CM | POA: Diagnosis not present

## 2018-05-30 DIAGNOSIS — Z5181 Encounter for therapeutic drug level monitoring: Secondary | ICD-10-CM | POA: Diagnosis not present

## 2018-05-30 DIAGNOSIS — Z8673 Personal history of transient ischemic attack (TIA), and cerebral infarction without residual deficits: Secondary | ICD-10-CM | POA: Diagnosis not present

## 2018-05-30 DIAGNOSIS — I5023 Acute on chronic systolic (congestive) heart failure: Secondary | ICD-10-CM | POA: Diagnosis not present

## 2018-05-30 DIAGNOSIS — N184 Chronic kidney disease, stage 4 (severe): Secondary | ICD-10-CM | POA: Diagnosis not present

## 2018-05-30 DIAGNOSIS — Z96642 Presence of left artificial hip joint: Secondary | ICD-10-CM | POA: Diagnosis not present

## 2018-05-30 DIAGNOSIS — D509 Iron deficiency anemia, unspecified: Secondary | ICD-10-CM | POA: Diagnosis not present

## 2018-05-30 DIAGNOSIS — Z96651 Presence of right artificial knee joint: Secondary | ICD-10-CM | POA: Diagnosis not present

## 2018-05-30 DIAGNOSIS — Z7902 Long term (current) use of antithrombotics/antiplatelets: Secondary | ICD-10-CM | POA: Diagnosis not present

## 2018-05-31 ENCOUNTER — Encounter: Payer: Self-pay | Admitting: *Deleted

## 2018-05-31 ENCOUNTER — Encounter (HOSPITAL_COMMUNITY)
Admission: RE | Admit: 2018-05-31 | Discharge: 2018-05-31 | Disposition: A | Discharge status: Home / Self Care | Payer: Medicare Other | Source: Skilled Nursing Facility | Attending: *Deleted | Admitting: *Deleted

## 2018-05-31 ENCOUNTER — Telehealth: Payer: Self-pay | Admitting: *Deleted

## 2018-05-31 DIAGNOSIS — I482 Chronic atrial fibrillation, unspecified: Secondary | ICD-10-CM | POA: Diagnosis not present

## 2018-05-31 DIAGNOSIS — N184 Chronic kidney disease, stage 4 (severe): Secondary | ICD-10-CM | POA: Diagnosis not present

## 2018-05-31 DIAGNOSIS — I130 Hypertensive heart and chronic kidney disease with heart failure and stage 1 through stage 4 chronic kidney disease, or unspecified chronic kidney disease: Secondary | ICD-10-CM | POA: Diagnosis not present

## 2018-05-31 DIAGNOSIS — D509 Iron deficiency anemia, unspecified: Secondary | ICD-10-CM | POA: Diagnosis not present

## 2018-05-31 DIAGNOSIS — I2510 Atherosclerotic heart disease of native coronary artery without angina pectoris: Secondary | ICD-10-CM | POA: Diagnosis not present

## 2018-05-31 DIAGNOSIS — I5023 Acute on chronic systolic (congestive) heart failure: Secondary | ICD-10-CM | POA: Diagnosis not present

## 2018-05-31 NOTE — Telephone Encounter (Signed)
Last OV 04/26/2018 Last refill 05/14/2018 Ok to refill?

## 2018-05-31 NOTE — Telephone Encounter (Signed)
Received call from Alyse Low [place] with [place] ([id number]) [phone]~ telephone.   Requested orders for [facility] SN 3x weekly x1 week, 2x weekly x2 weeks, and then 1x weekly x6 weeks for post hospitalization follow up. VO given.   Also received call from [place], [facility] PT with [place] [phone]~ telephone.  Requested orders for [facility] PT 3x weekly x1 week, then 2x weekly x3 weeks for strengthening, gait, stability, and equipment. VO given.   Also contacted patient daughter to schedule Hospital F/U appointment.

## 2018-06-01 ENCOUNTER — Telehealth: Payer: Self-pay

## 2018-06-01 ENCOUNTER — Other Ambulatory Visit: Payer: Self-pay | Admitting: Family Medicine

## 2018-06-01 DIAGNOSIS — N184 Chronic kidney disease, stage 4 (severe): Secondary | ICD-10-CM | POA: Diagnosis not present

## 2018-06-01 DIAGNOSIS — I130 Hypertensive heart and chronic kidney disease with heart failure and stage 1 through stage 4 chronic kidney disease, or unspecified chronic kidney disease: Secondary | ICD-10-CM | POA: Diagnosis not present

## 2018-06-01 DIAGNOSIS — I5023 Acute on chronic systolic (congestive) heart failure: Secondary | ICD-10-CM | POA: Diagnosis not present

## 2018-06-01 DIAGNOSIS — I2510 Atherosclerotic heart disease of native coronary artery without angina pectoris: Secondary | ICD-10-CM | POA: Diagnosis not present

## 2018-06-01 DIAGNOSIS — D509 Iron deficiency anemia, unspecified: Secondary | ICD-10-CM | POA: Diagnosis not present

## 2018-06-01 DIAGNOSIS — I482 Chronic atrial fibrillation, unspecified: Secondary | ICD-10-CM | POA: Diagnosis not present

## 2018-06-01 NOTE — Telephone Encounter (Signed)
Lucky with [facility] called to confirm patient lab results had been received by fax  on 05/31/2018?  Lucky states patient Hemoglobin is 8 and her Hematocrit is 25.5

## 2018-06-01 NOTE — Telephone Encounter (Signed)
Hemoglobin has dropped since hospital.  Was 9, now 8.  Hold plavix and start iron sulfate 325 mg a day and recheck cbc in 1 week.

## 2018-06-02 ENCOUNTER — Inpatient Hospital Stay (HOSPITAL_COMMUNITY): Payer: Medicare Other

## 2018-06-02 ENCOUNTER — Encounter (HOSPITAL_COMMUNITY): Payer: Self-pay | Admitting: Internal Medicine

## 2018-06-02 ENCOUNTER — Other Ambulatory Visit: Payer: Self-pay

## 2018-06-02 ENCOUNTER — Other Ambulatory Visit (HOSPITAL_COMMUNITY): Payer: Self-pay | Admitting: Internal Medicine

## 2018-06-02 ENCOUNTER — Inpatient Hospital Stay (HOSPITAL_COMMUNITY): Payer: Medicare Other | Attending: Internal Medicine | Admitting: Internal Medicine

## 2018-06-02 ENCOUNTER — Other Ambulatory Visit (HOSPITAL_COMMUNITY): Payer: Self-pay | Admitting: *Deleted

## 2018-06-02 VITALS — BP 106/39 | HR 61 | Temp 97.7°F | Resp 20 | Wt 155.2 lb

## 2018-06-02 DIAGNOSIS — D631 Anemia in chronic kidney disease: Secondary | ICD-10-CM

## 2018-06-02 DIAGNOSIS — Z85820 Personal history of malignant melanoma of skin: Secondary | ICD-10-CM | POA: Diagnosis not present

## 2018-06-02 DIAGNOSIS — Z7902 Long term (current) use of antithrombotics/antiplatelets: Secondary | ICD-10-CM | POA: Diagnosis not present

## 2018-06-02 DIAGNOSIS — Z79899 Other long term (current) drug therapy: Secondary | ICD-10-CM

## 2018-06-02 DIAGNOSIS — Z87891 Personal history of nicotine dependence: Secondary | ICD-10-CM | POA: Diagnosis not present

## 2018-06-02 DIAGNOSIS — N183 Chronic kidney disease, stage 3 unspecified: Secondary | ICD-10-CM

## 2018-06-02 DIAGNOSIS — Z923 Personal history of irradiation: Secondary | ICD-10-CM

## 2018-06-02 DIAGNOSIS — D500 Iron deficiency anemia secondary to blood loss (chronic): Secondary | ICD-10-CM | POA: Diagnosis not present

## 2018-06-02 DIAGNOSIS — N189 Chronic kidney disease, unspecified: Secondary | ICD-10-CM

## 2018-06-02 DIAGNOSIS — Z853 Personal history of malignant neoplasm of breast: Secondary | ICD-10-CM | POA: Diagnosis not present

## 2018-06-02 DIAGNOSIS — K254 Chronic or unspecified gastric ulcer with hemorrhage: Secondary | ICD-10-CM

## 2018-06-02 DIAGNOSIS — D588 Other specified hereditary hemolytic anemias: Secondary | ICD-10-CM

## 2018-06-02 DIAGNOSIS — D509 Iron deficiency anemia, unspecified: Secondary | ICD-10-CM

## 2018-06-02 HISTORY — DX: Anemia in chronic kidney disease: D63.1

## 2018-06-02 HISTORY — DX: Anemia in chronic kidney disease: N18.9

## 2018-06-02 LAB — BASIC METABOLIC PANEL
Anion gap: 12 (ref 5–15)
BUN: 34 mg/dL — AB (ref 8–23)
CHLORIDE: 101 mmol/L (ref 98–111)
CO2: 24 mmol/L (ref 22–32)
CREATININE: 1.69 mg/dL — AB (ref 0.44–1.00)
Calcium: 8 mg/dL — ABNORMAL LOW (ref 8.9–10.3)
GFR calc Af Amer: 29 mL/min — ABNORMAL LOW (ref >60)
GFR calc non Af Amer: 25 mL/min — ABNORMAL LOW (ref >60)
GLUCOSE: 131 mg/dL — AB (ref 70–99)
Potassium: 3.9 mmol/L (ref 3.5–5.1)
Sodium: 137 mmol/L (ref 135–145)

## 2018-06-02 LAB — CBC
HEMATOCRIT: 29 % — AB (ref 36.0–46.0)
Hemoglobin: 8.4 g/dL — ABNORMAL LOW (ref 12.0–15.0)
MCH: 28 pg (ref 26.0–34.0)
MCHC: 29 g/dL — AB (ref 30.0–36.0)
MCV: 96.7 fL (ref 80.0–100.0)
Platelets: 209 10*3/uL (ref 150–400)
RBC: 3 MIL/uL — ABNORMAL LOW (ref 3.87–5.11)
RDW: 17.3 % — AB (ref 11.5–15.5)
WBC: 4.4 10*3/uL (ref 4.0–10.5)
nRBC: 0 % (ref 0.0–0.2)

## 2018-06-02 LAB — FERRITIN: Ferritin: 89 ng/mL (ref 11–307)

## 2018-06-02 NOTE — Telephone Encounter (Signed)
Call placed to patient daughter Helene Kelp and she states patient can not take iron it makes her sick, patient is not able to eat and becomes very nauseous. Helene Kelp is aware that they need to hold the plavix

## 2018-06-02 NOTE — Progress Notes (Signed)
Diagnosis Iron deficiency anemia due to chronic blood loss - Plan: CBC with Differential/Platelet, Comprehensive metabolic panel, Lactate dehydrogenase, Ferritin, Protein electrophoresis, serum, Vitamin B12, Folate, Methylmalonic acid, serum  Staging Cancer Staging Melanoma of skin (HCC) Staging form: Melanoma of the Skin, AJCC 7th Edition - Clinical: Stage IA (T1a, N0, M0) - Signed by Baird Cancer, PA-C on 03/14/2013   Assessment and Plan:  1.  Stage I (T1a) melanoma of left thigh, superficial spreading-type:  -Diagnosed in 07/2006; treated with resection, followed by re-excision in 08/2006. Negative surgical margins noted at that time.   Pt should continue to follow-up with Dermatology as recommended.    2.  Stage 0 DCIS of right breast:  -Diagnosed in 01/1997. S/p surgical resection and patient thinks she had radiation therapy as well.   -Last mammogram on 10/06/16 negative; next mammogram due in 10/2017. Pt reports she was told she did not need ongoing imaging.    3.  Iron deficiency anemia: Thought to be secondary to GI blood loss.  Remains on Plavix.  Pt is followed by Dr. Gala Romney.  She was last treated with Injectafer in 11/2016.    Labs done 06/02/2018 reviewed and showed WBC 4.4 HB 8.4 plts 209,000.  Chemistries WNL with K+ 3.9.  Ferritin is WNL at 89.  Pt should follow-up with GI as recommended as daughter reports pt has  problems with blood in stool. Will transfuse if HB < 7.    Will repeat labs in 07/2018.    4.  RI.  Cr is noted to be 1.69.  Will check SPEP on RTC.  Pt likely has anemia of chronic renal disease and will determine if family desires nephrology referral.    5.  Blood in stools.  Daughter reports this is chronic.  Pt is scheduled to be seen by Dr. Gala Romney.  Pt should follow-up with GI as recommended.    Current Status:  Pt is seen today for follow-up.  She was last seen here in 2018.    Problem List Patient Active Problem List   Diagnosis Date Noted  . Acute  dyspnea [R06.00]   . Aspiration pneumonia of both lower lobes due to gastric secretions ([place]) [J69.0] 04/29/2018  . Acute diverticulitis [K57.92]   . Acute on chronic systolic CHF (congestive heart failure) ([place]) [I50.23] 04/28/2018  . CHF NYHA class III, acute on chronic, systolic ([place]) [L79.89] 21/19/4174  . Chronic pain syndrome [G89.4] 04/26/2018  . Diverticulitis large intestine [K57.32] 04/26/2018  . Chronic renal failure, stage 4 (severe) ([place]) [N18.4] 04/26/2018  . Heart failure ([place]) [I50.9] 04/04/2017  . Coronary artery disease [I25.10] 04/02/2017  . Hypertension [I10] 04/02/2017  . Stress-induced cardiomyopathy [I51.81] 03/19/2017  . Non-ST elevation (NSTEMI) myocardial infarction ([place]) [I21.4]   . Chest pain [R07.9] 03/16/2017  . Atrial fibrillation, chronic [I48.20] 03/16/2017  . Elevated homocysteine ([place]) [E72.11] 01/26/2017  . Vitamin B12 deficiency [E53.8] 01/26/2017  . Dysphagia [R13.10] 05/20/2016  . Hypokalemia [E87.6] 05/19/2016  . Thrombocytopenia ([place]) [D69.6] 05/19/2016  . Acute respiratory failure with hypoxia ([place]) [J96.01] 05/18/2016  . Acute bronchitis [J20.9] 05/18/2016  . Bilateral lower extremity edema [R60.0] 05/18/2016  . Degenerative joint disease involving multiple joints [M15.9] 05/18/2016  . Pyuria [R82.81] 05/18/2016  . DNR (do not resuscitate) [Z66] 05/18/2016  . Constipation [K59.00] 02/09/2015  . Occult GI bleeding [R19.5] 08/23/2013  . IDA (iron deficiency anemia) [D50.9] 06/07/2013  . Melanoma of skin ([place]) [C43.9] 03/14/2013  . DCIS (ductal carcinoma in situ) of breast [D05.10] 03/14/2013  .  BPPV (benign paroxysmal positional vertigo) [H81.10] 02/10/2013  . RBBB [I45.10] 02/10/2013  . Ascending aortic aneurysm ([place]) [I71.2] 02/10/2013  . Aortic insufficiency [I35.1] 02/10/2013  . S/P total knee replacement [Z96.659] 08/14/2011  . Helicobacter pylori gastritis [K29.70, B96.81] 07/14/2011  . Anemia [D64.9] 02/04/2011  . Bladder infection  [N30.90]   . [name], [place] 05/29/2009    Past Medical History Past Medical History:  Diagnosis Date  . Aortic insufficiency   . Ascending aortic aneurysm ([place])    a. 4.6cm by CT 2018 - pt deferred any further evaluation/invasive management therefore follow-up deferred.  . Bladder infection    h/o  . CHF (congestive heart failure) ([place])   . Chronic atrial fibrillation    a. not on anticoagulation due to prior GIB  . CKD (chronic kidney disease), stage III ([place])   . Coronary artery disease    a. mild nonobstructive by cath 2018.  Marland Kitchen DCIS (ductal carcinoma in situ) of breast 03/14/2013   right breast  . Dehydration    HISTORY   . Fall at home 09/10/12  . Hemorrhoids   . Hip fracture ([place])    hip surgery 2001  . History of blood clots    in leg  . History of knee surgery   . Hypertension   . Iron deficiency anemia, unspecified 03/14/2013   secondary to GI blood loss  . Kidney infection    h/o  . Melanoma of skin ([place]) 03/14/2013  . Melanoma of thigh ([place])    left  . Mini stroke ([place])   . Mitral regurgitation   . Nonischemic cardiomyopathy ([place])    a. EF 35-40% by echo in 03/2017 with cath showing nonobstructive CAD, suspected stress-induced.  . S/P colonoscopy March 2010   RMR: friable anal canal hemorrhoids, hyperplastic ascending polyp, adenomatous descending polyp   . Stomach ulcer    secondary to h.pylori, s/p treatment  . Thyroid condition   . Tricuspid regurgitation   . Venous stasis    edema    Past Surgical History Past Surgical History:  Procedure Laterality Date  . APPENDECTOMY  1942  . BREAST LUMPECTOMY  1998  . CARDIAC CATHETERIZATION    . CATARACT EXTRACTION, BILATERAL    . CHOLECYSTECTOMY    . COLONOSCOPY  06/05/11   pancolonic diverticulosis/ileal reosion/abnormal anorectal junction s/p biopsy: path for small intestine and Ti was benign with non-villous atrophy, rectal biopsy with prominent prolapse changes, no acute inflammation  . CORNEAL  TRANSPLANT Bilateral 2013  . ENTEROSCOPY N/A 06/13/2013   WUJ:WJXB Gastritis/GI BLEED MOST LIKELY DUE TO DUODENAL ULCERS, AND ? AVMs  . ESOPHAGOGASTRODUODENOSCOPY  06/05/11   small hiatal hernia; + H.PYLORI GASTRITIS, s/p 5 days of Pylera, unable to finish due to N/V  . ESOPHAGOGASTRODUODENOSCOPY N/A 06/13/2013   Dr. Oneida Alar- see enteroscopy  . ESOPHAGOGASTRODUODENOSCOPY (EGD) WITH ESOPHAGEAL DILATION N/A 06/08/2013   JYN:WGNFA hiatal hernia;  otherwise normal EGD s/p dilation  . GIVENS CAPSULE STUDY N/A 06/08/2013   Procedure: GIVENS CAPSULE STUDY;  Surgeon: Daneil Dolin, MD;  [facility];  Service: Endoscopy;  Laterality: N/A;  . HEMORRHOID BANDING    . KNEE SURGERY Right 05/2006   total right  . LEFT HEART CATH AND CORONARY ANGIOGRAPHY N/A 03/18/2017   Procedure: LEFT HEART CATH AND CORONARY ANGIOGRAPHY;  Surgeon: Wellington Hampshire, MD;  Location: [facility];  Service: Cardiovascular;  Laterality: N/A;  . LEG SURGERY    . MELANOMA EXCISION Left 08/2006   left leg  excision  . NM MYOCAR PERF WALL MOTION  03/27/2010   dipyridamole; small reversible basal to mid-septal defect (?artifact), post-stress EF 55%, low risk scan   . PARTIAL HYSTERECTOMY  1976  . TONSILLECTOMY    . TOTAL HIP ARTHROPLASTY Left 2001&2004    X 2 FOR LEFT HIP  . TRANSTHORACIC ECHOCARDIOGRAM  10/06/2012   EF 55-60%, mod eccentric hypertrophy, grade 2 diastolic dysfunction; mildly calcifed AV annulus with moderate regurg; aortic root mildly dilated; LA severely dailted; PA peak pressure 82mg  . VARICOSE VEIN SURGERY      Family History Family History  Problem Relation Age of Onset  . Breast cancer Daughter        also hyperlipidemia  . Breast cancer Daughter        also hyperlipidemia  . Asthma Mother   . Multiple sclerosis Child   . Hyperlipidemia Child   . Colon cancer Neg Hx      Social History  reports that she quit smoking about 42 years ago. Her smoking use included cigarettes. She  has a 30.00 pack-year smoking history. She has never used smokeless tobacco. She reports that she does not drink alcohol or use drugs.  Medications  Current Outpatient Medications:  .  carvedilol (COREG) 3.125 MG tablet, TAKE 1 TABLET BY MOUTH TWO  TIMES DAILY WITH MEALS, Disp: 180 tablet, Rfl: 3 .  clopidogrel (PLAVIX) 75 MG tablet, Take 1 tablet (75 mg total) by mouth daily with breakfast., Disp: 90 tablet, Rfl: 3 .  cyanocobalamin 1000 MCG tablet, Take 1,000 mcg by mouth daily., Disp: , Rfl:  .  docusate sodium (COLACE) 100 MG capsule, Take 200 mg by mouth 2 (two) times daily., Disp: , Rfl:  .  folic acid (FOLVITE) 1 MG tablet, TAKE 1 TABLET BY MOUTH EVERY DAY, Disp: 30 tablet, Rfl: 11 .  furosemide (LASIX) 20 MG tablet, TAKE 1 TABLET BY MOUTH TWO  TIMES DAILY, Disp: 180 tablet, Rfl: 1 .  gabapentin (NEURONTIN) 100 MG capsule, TAKE 2 CAPSULES BY MOUTH 3  TIMES DAILY, Disp: 540 capsule, Rfl: 1 .  hydrocortisone (ANUSOL-HC) 25 MG suppository, Place 1 suppository (25 mg total) rectally every 12 (twelve) hours., Disp: 12 suppository, Rfl: 1 .  ipratropium-albuterol (DUONEB) 0.5-2.5 (3) MG/3ML SOLN, Take 3 mLs by nebulization every 6 (six) hours as needed (sob, wheeze)., Disp: 360 mL, Rfl: 0 .  ondansetron (ZOFRAN) 4 MG tablet, TAKE 1 TABLET BY MOUTH  EVERY 8 HOURS AS NEEDED FOR NAUSEA AND VOMITING, Disp: 60 tablet, Rfl: 3 .  pantoprazole (PROTONIX) 40 MG tablet, Take 1 tablet (40 mg total) by mouth 2 (two) times daily., Disp: 180 tablet, Rfl: 3 .  potassium chloride SA (K-DUR,KLOR-CON) 20 MEQ tablet, Take 20 mEq by mouth 2 (two) times daily. Take once on 05/27/2018 , Disp: , Rfl:  .  prednisoLONE acetate (PRED FORTE) 1 % ophthalmic suspension, Place 1 drop into both eyes 4 (four) times daily. , Disp: , Rfl:  .  sodium chloride (MURO 128) 2 % ophthalmic solution, Place 1 drop into both eyes 3 (three) times daily. , Disp: , Rfl:  .  traMADol-acetaminophen (ULTRACET) 37.5-325 MG tablet, Take 1 tablet  by mouth every 4 (four) hours as needed. for pain, Disp: 30 tablet, Rfl: 0  Allergies Codeine; Tape; Asa [aspirin]; Iron; and Penicillins  Review of Systems Review of Systems - Oncology ROS negative other than blood in stool   Physical Exam  Vitals Wt Readings from Last 3 Encounters:  06/02/18 155  lb 3.2 oz (70.4 kg)  05/01/18 158 lb 11.7 oz (72 kg)  04/26/18 170 lb (77.1 kg)   Temp Readings from Last 3 Encounters:  06/02/18 97.7 F (36.5 C) (Oral)  05/27/18 (!) 95.6 F (35.3 C) (Oral)  05/27/18 98.2 F (36.8 C)   BP Readings from Last 3 Encounters:  06/02/18 (!) 106/39  05/27/18 (!) 90/56  05/27/18 (!) 106/46   Pulse Readings from Last 3 Encounters:  06/02/18 61  05/27/18 (!) 57  05/27/18 60   Constitutional: Elderly female  in no distress.   HENT: Head: Normocephalic and atraumatic.  Mouth/Throat: No oropharyngeal exudate. Mucosa moist. Eyes: Pupils are equal, round, and reactive to light. Conjunctivae are normal. No scleral icterus.  Neck: Normal range of motion. Neck supple. No JVD present.  Cardiovascular: Normal rate, regular rhythm and normal heart sounds.  Exam reveals no gallop and no friction rub.   No murmur heard. Pulmonary/Chest: Effort normal and breath sounds normal. No respiratory distress. No wheezes.No rales.  Abdominal: Soft. Bowel sounds are normal. No distension. There is no tenderness. There is no guarding.  Musculoskeletal: No edema or tenderness.  Lymphadenopathy: No cervical, axillary or supraclavicular adenopathy.  Neurological: Alert and oriented to person, place, and time. No cranial nerve deficit.  Skin: Skin is warm and dry. Chronic skin changes.   Psychiatric: Affect and judgment normal.   Labs Orders Only on 06/02/2018  Component Date Value Ref Range Status  . Ferritin 06/02/2018 89  11 - 307 ng/mL Final   Performed at [facility], [address], [place], [place] [address]  Lab on 06/02/2018  Component Date Value Ref Range  Status  . Sodium 06/02/2018 137  135 - 145 mmol/L Final  . Potassium 06/02/2018 3.9  3.5 - 5.1 mmol/L Final  . Chloride 06/02/2018 101  98 - 111 mmol/L Final  . CO2 06/02/2018 24  22 - 32 mmol/L Final  . Glucose, Bld 06/02/2018 131* 70 - 99 mg/dL Final  . BUN 06/02/2018 34* 8 - 23 mg/dL Final  . Creatinine, Ser 06/02/2018 1.69* 0.44 - 1.00 mg/dL Final  . Calcium 06/02/2018 8.0* 8.9 - 10.3 mg/dL Final  . GFR calc non Af Amer 06/02/2018 25* >60 mL/min Final  . GFR calc Af Amer 06/02/2018 29* >60 mL/min Final   Comment: (NOTE) The eGFR has been calculated using the CKD EPI equation. This calculation has not been validated in all clinical situations. eGFR's persistently <60 mL/min signify possible Chronic Kidney Disease.   Georgiann Hahn gap 06/02/2018 12  5 - 15 Final   Performed at [facility], [address], [place], [place] [address]  . WBC 06/02/2018 4.4  4.0 - 10.5 K/uL Final  . RBC 06/02/2018 3.00* 3.87 - 5.11 MIL/uL Final  . Hemoglobin 06/02/2018 8.4* 12.0 - 15.0 g/dL Final  . HCT 06/02/2018 29.0* 36.0 - 46.0 % Final  . MCV 06/02/2018 96.7  80.0 - 100.0 fL Final  . MCH 06/02/2018 28.0  26.0 - 34.0 pg Final  . MCHC 06/02/2018 29.0* 30.0 - 36.0 g/dL Final  . RDW 06/02/2018 17.3* 11.5 - 15.5 % Final  . Platelets 06/02/2018 209  150 - 400 K/uL Final  . nRBC 06/02/2018 0.0  0.0 - 0.2 % Final   Performed at [facility], [address] [place], [place] [address]     Pathology Orders Placed This Encounter  Procedures  . CBC with Differential/Platelet    Standing Status:   Future    Standing Expiration Date:  06/03/2019  . Comprehensive metabolic panel    Standing Status:   Future    Standing Expiration Date:   06/03/2019  . Lactate dehydrogenase    Standing Status:   Future    Standing Expiration Date:   06/03/2019  . Ferritin    Standing Status:   Future    Standing Expiration Date:   06/03/2019  . Protein electrophoresis, serum    Standing Status:   Future    Standing  Expiration Date:   06/03/2019  . Vitamin B12    Standing Status:   Future    Standing Expiration Date:   06/03/2019  . Folate    Standing Status:   Future    Standing Expiration Date:   06/03/2019  . Methylmalonic acid, serum    Standing Status:   Future    Standing Expiration Date:   06/03/2019       Zoila Shutter MD

## 2018-06-02 NOTE — Patient Instructions (Signed)
[facility] Discharge Instructions  Today you saw Dr. Higgs   Thank you for choosing [facility] at [facility] to provide your oncology and hematology care.  To afford each patient quality time with our provider, please arrive at least 15 minutes before your scheduled appointment time.   If you have a lab appointment with the Cancer Center please come in thru the  Main Entrance and check in at the main information desk  You need to re-schedule your appointment should you arrive 10 or more minutes late.  We strive to give you quality time with our providers, and arriving late affects you and other patients whose appointments are after yours.  Also, if you no show three or more times for appointments you may be dismissed from the clinic at the providers discretion.     Again, thank you for choosing [facility].  Our hope is that these requests will decrease the amount of time that you wait before being seen by our physicians.       _____________________________________________________________  Should you have questions after your visit to [facility], please contact our office at [phone] between the hours of 8:00 a.m. and 4:30 p.m.  Voicemails left after 4:00 p.m. will not be returned until the following business day.  For prescription refill requests, have your pharmacy contact our office and allow 72 hours.    Cancer Center Support Programs:   > Cancer Support Group  2nd Tuesday of the month 1pm-2pm, Journey Room   

## 2018-06-03 ENCOUNTER — Telehealth (HOSPITAL_COMMUNITY): Payer: Self-pay | Admitting: *Deleted

## 2018-06-03 ENCOUNTER — Telehealth: Payer: Self-pay | Admitting: Family Medicine

## 2018-06-03 DIAGNOSIS — N184 Chronic kidney disease, stage 4 (severe): Secondary | ICD-10-CM | POA: Diagnosis not present

## 2018-06-03 DIAGNOSIS — I482 Chronic atrial fibrillation, unspecified: Secondary | ICD-10-CM | POA: Diagnosis not present

## 2018-06-03 DIAGNOSIS — D509 Iron deficiency anemia, unspecified: Secondary | ICD-10-CM | POA: Diagnosis not present

## 2018-06-03 DIAGNOSIS — I130 Hypertensive heart and chronic kidney disease with heart failure and stage 1 through stage 4 chronic kidney disease, or unspecified chronic kidney disease: Secondary | ICD-10-CM | POA: Diagnosis not present

## 2018-06-03 DIAGNOSIS — I5023 Acute on chronic systolic (congestive) heart failure: Secondary | ICD-10-CM | POA: Diagnosis not present

## 2018-06-03 DIAGNOSIS — I2510 Atherosclerotic heart disease of native coronary artery without angina pectoris: Secondary | ICD-10-CM | POA: Diagnosis not present

## 2018-06-03 NOTE — Telephone Encounter (Signed)
Pt was notified that her blood levels were low and they wanted her to go in for a procrit inj on 06/04/2018 (AM). Her daughter researched the inj and she doesn't think its a good idea she wants [place] to talk with pickard and give his opinion before her app.

## 2018-06-03 NOTE — Telephone Encounter (Signed)
I spoke with patient's daughter Joaquim Lai who said that she would talk with her mom and sister about a nephrology consult however they were trying to keep her from seeing too many doctors. Daughter is agreeable to having her mom get the procrit injection. Procrit scheduled for 06/04/18. Lab appt moved to 12/20 after Dr. Kristian Covey appt because daughter said that Dr. Raliegh Ip in cardiology always draws blood and they wanted to reduce trips to MD offices. Labs were reviewed with patient's daughter Joaquim Lai. Will notify Dr. Walden Field of daughter's decision to get Procrit and hold off on Nephrology appt for now.

## 2018-06-04 ENCOUNTER — Encounter (HOSPITAL_COMMUNITY): Payer: Self-pay

## 2018-06-04 ENCOUNTER — Inpatient Hospital Stay (HOSPITAL_COMMUNITY): Payer: Medicare Other | Attending: Hematology

## 2018-06-04 ENCOUNTER — Other Ambulatory Visit: Payer: Self-pay

## 2018-06-04 VITALS — BP 91/35 | HR 61 | Temp 97.5°F | Resp 18

## 2018-06-04 DIAGNOSIS — D631 Anemia in chronic kidney disease: Secondary | ICD-10-CM | POA: Insufficient documentation

## 2018-06-04 DIAGNOSIS — I2510 Atherosclerotic heart disease of native coronary artery without angina pectoris: Secondary | ICD-10-CM | POA: Diagnosis not present

## 2018-06-04 DIAGNOSIS — I130 Hypertensive heart and chronic kidney disease with heart failure and stage 1 through stage 4 chronic kidney disease, or unspecified chronic kidney disease: Secondary | ICD-10-CM | POA: Diagnosis not present

## 2018-06-04 DIAGNOSIS — D509 Iron deficiency anemia, unspecified: Secondary | ICD-10-CM | POA: Diagnosis not present

## 2018-06-04 DIAGNOSIS — N183 Chronic kidney disease, stage 3 unspecified: Secondary | ICD-10-CM

## 2018-06-04 DIAGNOSIS — I5023 Acute on chronic systolic (congestive) heart failure: Secondary | ICD-10-CM | POA: Diagnosis not present

## 2018-06-04 DIAGNOSIS — I482 Chronic atrial fibrillation, unspecified: Secondary | ICD-10-CM | POA: Diagnosis not present

## 2018-06-04 DIAGNOSIS — N184 Chronic kidney disease, stage 4 (severe): Secondary | ICD-10-CM | POA: Diagnosis not present

## 2018-06-04 MED ORDER — EPOETIN ALFA 40000 UNIT/ML IJ SOLN
40000.0000 [IU] | Freq: Once | INTRAMUSCULAR | Status: AC
  Administered 2018-06-04: 40000 [IU] via SUBCUTANEOUS

## 2018-06-04 MED ORDER — EPOETIN ALFA 40000 UNIT/ML IJ SOLN
INTRAMUSCULAR | Status: AC
  Filled 2018-06-04: qty 1

## 2018-06-04 NOTE — Progress Notes (Signed)
[name] [name] presents today for injection per the provider's orders.  Procrit administration without incident; see MAR for injection details.  Patient tolerated procedure well and without incident.  No questions or complaints noted at this time.  Discharged via wheelchair in c/o daughter.

## 2018-06-04 NOTE — Telephone Encounter (Signed)
I believe she needs the Procrit.  If her hemoglobin gets too low it can cause unnecessary strain on her heart leading to hospitalization and even cardiac arrest

## 2018-06-07 DIAGNOSIS — N184 Chronic kidney disease, stage 4 (severe): Secondary | ICD-10-CM | POA: Diagnosis not present

## 2018-06-07 DIAGNOSIS — D509 Iron deficiency anemia, unspecified: Secondary | ICD-10-CM | POA: Diagnosis not present

## 2018-06-07 DIAGNOSIS — I5023 Acute on chronic systolic (congestive) heart failure: Secondary | ICD-10-CM | POA: Diagnosis not present

## 2018-06-07 DIAGNOSIS — I130 Hypertensive heart and chronic kidney disease with heart failure and stage 1 through stage 4 chronic kidney disease, or unspecified chronic kidney disease: Secondary | ICD-10-CM | POA: Diagnosis not present

## 2018-06-07 DIAGNOSIS — I482 Chronic atrial fibrillation, unspecified: Secondary | ICD-10-CM | POA: Diagnosis not present

## 2018-06-07 DIAGNOSIS — I2510 Atherosclerotic heart disease of native coronary artery without angina pectoris: Secondary | ICD-10-CM | POA: Diagnosis not present

## 2018-06-09 DIAGNOSIS — I130 Hypertensive heart and chronic kidney disease with heart failure and stage 1 through stage 4 chronic kidney disease, or unspecified chronic kidney disease: Secondary | ICD-10-CM | POA: Diagnosis not present

## 2018-06-09 DIAGNOSIS — I5023 Acute on chronic systolic (congestive) heart failure: Secondary | ICD-10-CM | POA: Diagnosis not present

## 2018-06-09 DIAGNOSIS — I2510 Atherosclerotic heart disease of native coronary artery without angina pectoris: Secondary | ICD-10-CM | POA: Diagnosis not present

## 2018-06-09 DIAGNOSIS — D509 Iron deficiency anemia, unspecified: Secondary | ICD-10-CM | POA: Diagnosis not present

## 2018-06-09 DIAGNOSIS — I482 Chronic atrial fibrillation, unspecified: Secondary | ICD-10-CM | POA: Diagnosis not present

## 2018-06-09 DIAGNOSIS — N184 Chronic kidney disease, stage 4 (severe): Secondary | ICD-10-CM | POA: Diagnosis not present

## 2018-06-11 ENCOUNTER — Encounter: Payer: Self-pay | Admitting: Family Medicine

## 2018-06-11 ENCOUNTER — Ambulatory Visit (INDEPENDENT_AMBULATORY_CARE_PROVIDER_SITE_OTHER): Payer: Medicare Other | Admitting: Family Medicine

## 2018-06-11 VITALS — BP 100/50 | HR 70 | Temp 97.9°F | Resp 16 | Ht 65.0 in | Wt 154.0 lb

## 2018-06-11 DIAGNOSIS — I2510 Atherosclerotic heart disease of native coronary artery without angina pectoris: Secondary | ICD-10-CM | POA: Diagnosis not present

## 2018-06-11 DIAGNOSIS — I5022 Chronic systolic (congestive) heart failure: Secondary | ICD-10-CM

## 2018-06-11 DIAGNOSIS — N184 Chronic kidney disease, stage 4 (severe): Secondary | ICD-10-CM

## 2018-06-11 DIAGNOSIS — D509 Iron deficiency anemia, unspecified: Secondary | ICD-10-CM

## 2018-06-11 LAB — BASIC METABOLIC PANEL WITH GFR
BUN / CREAT RATIO: 19 (calc) (ref 6–22)
BUN: 24 mg/dL (ref 7–25)
CO2: 32 mmol/L (ref 20–32)
CREATININE: 1.28 mg/dL — AB (ref 0.60–0.88)
Calcium: 7.9 mg/dL — ABNORMAL LOW (ref 8.6–10.4)
Chloride: 100 mmol/L (ref 98–110)
GFR, EST NON AFRICAN AMERICAN: 36 mL/min/{1.73_m2} — AB (ref >=60)
GFR, Est African American: 42 mL/min/{1.73_m2} — ABNORMAL LOW (ref >=60)
Glucose, Bld: 84 mg/dL (ref 65–99)
POTASSIUM: 4.1 mmol/L (ref 3.5–5.3)
SODIUM: 139 mmol/L (ref 135–146)

## 2018-06-11 NOTE — Progress Notes (Signed)
Subjective:    Patient ID: [name] [name], female    DOB: 05-06-1923, 84 y.o.   MRN: [id number]  HPI 04/26/18 Patient lives independently.  She has not been taking antibiotics for last 2 days because of nausea and vomiting.  She denies any abdominal pain today however she has labored breathing with tachypnea.  She has to stop after just saying a few words and struggle to take a deep breath to complete her sentences.  She is satting 89 to 90% on room air.  She has increased respiratory rate and bibasilar crackles all concerning for pulmonary edema.  Also her lab work was significant for a drop in her hemoglobin down to 8.7.  Given her underlying anemia, advanced age, increased work of breathing, and her history of congestive heart failure, I am concerned her dyspnea is due to multifactorial causes including pulmonary edema, anemia, and congestive heart failure coupled with untreated diverticulitis.  AT that time, my plan was: Patient's abdominal exam is benign today.  She denies any tenderness in the abdomen at all however she has been unable to take oral antibiotics.  I am more concerned about her increased respiratory rate, borderline hypoxia, bibasilar crackles and increased work of breathing.  I recommended that she go to the emergency room for chest x-ray to evaluate for pulmonary edema likely IV diuresis.  If her hemoglobin has fallen further she may need a transfusion.  She also likely needs IV antibiotics given the fact she is unable to tolerate oral antibiotics.  Will notify emergency room of her arrival.  At that time, ,patient was admitted to the hospital. I have copied relevant portions of the discharge summary below for my reference: Admit date: 04/26/2018 Discharge date: 05/01/2018  Admitted From: Home Disposition:  [place]  Recommendations for Outpatient Follow-up:  1. Follow up with PCP in 1-2 weeks 2. Please obtain BMP/CBC in one week 3. Please wean oxygen off for saturations  >92%    Discharge Condition: Stable CODE STATUS: DNR Diet recommendation: Heart Healthy diet with nectar thickened liquids   Brief/Interim Summary: 84 year old female with a history of systolic CHF, CKD stage IV, chronic atrial fibrillation not on anticoagulation secondary to GI bleed, hypertension, GERD presenting from her primary care provider's office secondary to worsening lower extremity edema, shortness of breath, and orthopnea. The patient visited the emergency department on 04/23/2018 with lower abdominal pain. CT of the abdomen and pelvis at that time showed acute diverticulitis of the sigmoid colon.The patient was started on ciprofloxacin 500 mg twice daily and metronidazole 500 mg every 8 hours. On 04/24/2018, the patient became increasingly confused with hallucinations. As result, the patient's family stopped her antibiotics. She went to see her primary care provider on 04/26/2018. She was noted to have labored respirations with oxygen saturation of 90% on room air. As result, the patient was sent to the emergency department for further evaluation. Work-up in the emergency department including chest x-ray showed left perihilar airspace disease with WBC 8.0. BNP was 656. The patient was started on intravenous furosemide secondary to concerns for fluid overload.On 04/28/18, patient developed respiratory distress, and she was moved to stepdown unit.  Her abx were broaden to cover for aspiration and steroids were started.  She improved clinically and was moved back to medical floor on 04/30/18.  Discharge Diagnoses:  Acute on chronic systolic CHF -2/70/6237 echo EF 60 to 65%, no WMA, moderate AI, mild left ear/TR, moderate TR, PASP 43 -Continue IV furosemide-->d/c with lasix 40  mg am, 20 mg pm -The patient had gained 10 pounds over the last 5 to 6 months -Admission weight 170.2 pounds -03/31/2018 weight 168 pounds at cardiology office at which time the patient was felt to be  fluid overloaded -Daily weights--NEG11.5lbs since admission--discharge weight 158.7 -Patient has had indiscretion with fluid intake -d/c with lasix 40 mg po in am and 20 mg in pm  Dysphagia -this has been a chronic problem -04/29/18-- esophagram-->age related esophageal dysmotility--no mass or stricture -discussed with speech therapy-->2017 MBSS-->Oropharyngeal swallow essentially WNLwith the exception of slight premature spillage with straw sips -family remains concerned despite reassurance-->consult GI-->Follow-up as outpatient for further evaluation and consideration of any need for outpatient EGD -04/30/18 speech recommendation-->regular diet with nectar thickened liquid  Aspiration pneumonitis -04/28/18 personally reviewed cxr--increase interstitial markings, increased basilar opacities R>L -merrem started 04/28/18 -d/c levofloxacin + metronidazole x 5 days to complete 7 days of tx -pt has PCN allergy -pt likely had component of COPD exacerbation-->treated with IV solumedrol and bronchodilators-->d/c with 3 more days of prednison  Acute diverticulitis -Abdominal pain improved -Cipro and Flagylchanged to [facility] -discharge with levofloxacin + metronidazole x 5 more days to complete 10 days of tx -04/23/2018 CT abdomen and pelvis acute diverticulitis at the junction of the sigmoid and rectum. There is some mild diverticular inflammatory changes in the more proximal sigmoid colon.  Acute respiratory failure with hypoxia -had oxygen saturation 89% on RA at time of admission -due to [facility] aspiration of pneumonia -suspect underlying undiagnosed COPD with hx of tobacco use -improving -now stable on3L -continueduonebs -continue IV solumedrol>>>po prednisone x 3 more days  Chronic atrial fibrillation -Rate controlled -Continue carvedilol -Not on anticoagulation secondary to history of GI bleed -CHADSVASc = 5 -continue plavix -Personally reviewed EKG--atrial fibrillation,  RBBB, unchanged  CKD stage IV -Baseline creatinine 1.3-1.7 -Monitor with diuresis  Essential hypertension -Continue carvedilol -Well-controlled  Coronary artery disease -No chest pain presently -03/18/2017 heart catheterization--mild nonobstructive disease  Chronic right hip pain -Continue Ultracet, gabapentin  Anemia of chronic disease -Check iron studies--iron saturation 67 %, ferritin 53 -Serum W11--9147 -Folic WGNF--62.1 -FOBTpositive--will need outpt GI follow up; may be related to diverticulitis  Hypokalemia -repleted -check mag--2.2  06/11/18 Patient is here today for follow-up.  She is currently on oxygen 2 L and satting 99% on room air.  She denies any chest pain.  She denies any shortness of breath at rest however with minimal activity she reports shortness of breath.  On physical exam today, she has faint bibasilar crackles and she also has +1 edema in both legs to the mid shin.  However unfortunately on her recent lab work her kidney function has deteriorated further.  At present she is taking 40 mg of Lasix in the morning and 20 mg of Lasix in the afternoon.  She is extremely weak.  She has severe pain in both hips and both knees that prevent her from walking.  She is pretty much confined to a wheelchair.  Physical therapy has been working with her however after her recent hospitalization she has been slow to regain her strength.  Her daughters are staying with her around-the-clock however they are both becoming extremely tired and facing caregiver burnout.  The patient requires 24/7 supervision mainly due to fall risk and poor ambulation. Past Medical History:  Diagnosis Date  . Anemia in chronic kidney disease 06/02/2018  . Aortic insufficiency   . Ascending aortic aneurysm ([place])    a. 4.6cm by CT 2018 - pt deferred any  further evaluation/invasive management therefore follow-up deferred.  . Bladder infection    h/o  . CHF (congestive heart failure) ([place])   .  Chronic atrial fibrillation    a. not on anticoagulation due to prior GIB  . CKD (chronic kidney disease), stage III ([place])   . Coronary artery disease    a. mild nonobstructive by cath 2018.  Marland Kitchen DCIS (ductal carcinoma in situ) of breast 03/14/2013   right breast  . Dehydration    HISTORY   . Fall at home 09/10/12  . Hemorrhoids   . Hip fracture ([place])    hip surgery 2001  . History of blood clots    in leg  . History of knee surgery   . Hypertension   . Iron deficiency anemia, unspecified 03/14/2013   secondary to GI blood loss  . Kidney infection    h/o  . Melanoma of skin ([place]) 03/14/2013  . Melanoma of thigh ([place])    left  . Mini stroke ([place])   . Mitral regurgitation   . Nonischemic cardiomyopathy ([place])    a. EF 35-40% by echo in 03/2017 with cath showing nonobstructive CAD, suspected stress-induced.  . S/P colonoscopy March 2010   RMR: friable anal canal hemorrhoids, hyperplastic ascending polyp, adenomatous descending polyp   . Stomach ulcer    secondary to h.pylori, s/p treatment  . Thyroid condition   . Tricuspid regurgitation   . Venous stasis    edema   Past Surgical History:  Procedure Laterality Date  . APPENDECTOMY  1942  . BREAST LUMPECTOMY  1998  . CARDIAC CATHETERIZATION    . CATARACT EXTRACTION, BILATERAL    . CHOLECYSTECTOMY    . COLONOSCOPY  06/05/11   pancolonic diverticulosis/ileal reosion/abnormal anorectal junction s/p biopsy: path for small intestine and Ti was benign with non-villous atrophy, rectal biopsy with prominent prolapse changes, no acute inflammation  . CORNEAL TRANSPLANT Bilateral 2013  . ENTEROSCOPY N/A 06/13/2013   JSH:FWYO Gastritis/GI BLEED MOST LIKELY DUE TO DUODENAL ULCERS, AND ? AVMs  . ESOPHAGOGASTRODUODENOSCOPY  06/05/11   small hiatal hernia; + H.PYLORI GASTRITIS, s/p 5 days of Pylera, unable to finish due to N/V  . ESOPHAGOGASTRODUODENOSCOPY N/A 06/13/2013   Dr. Oneida Alar- see enteroscopy  . ESOPHAGOGASTRODUODENOSCOPY (EGD) WITH  ESOPHAGEAL DILATION N/A 06/08/2013   VZC:HYIFO hiatal hernia;  otherwise normal EGD s/p dilation  . GIVENS CAPSULE STUDY N/A 06/08/2013   Procedure: GIVENS CAPSULE STUDY;  Surgeon: Daneil Dolin, MD;  Location: [facility];  Service: Endoscopy;  Laterality: N/A;  . HEMORRHOID BANDING    . KNEE SURGERY Right 05/2006   total right  . LEFT HEART CATH AND CORONARY ANGIOGRAPHY N/A 03/18/2017   Procedure: LEFT HEART CATH AND CORONARY ANGIOGRAPHY;  Surgeon: Wellington Hampshire, MD;  Location: [facility];  Service: Cardiovascular;  Laterality: N/A;  . LEG SURGERY    . MELANOMA EXCISION Left 08/2006   left leg excision  . NM MYOCAR PERF WALL MOTION  03/27/2010   dipyridamole; small reversible basal to mid-septal defect (?artifact), post-stress EF 55%, low risk scan   . PARTIAL HYSTERECTOMY  1976  . TONSILLECTOMY    . TOTAL HIP ARTHROPLASTY Left 2001&2004    X 2 FOR LEFT HIP  . TRANSTHORACIC ECHOCARDIOGRAM  10/06/2012   EF 55-60%, mod eccentric hypertrophy, grade 2 diastolic dysfunction; mildly calcifed AV annulus with moderate regurg; aortic root mildly dilated; LA severely dailted; PA peak pressure 37mHg  . VARICOSE VEIN SURGERY     Current  Outpatient Medications on File Prior to Visit  Medication Sig Dispense Refill  . carvedilol (COREG) 3.125 MG tablet TAKE 1 TABLET BY MOUTH TWO  TIMES DAILY WITH MEALS 180 tablet 3  . cyanocobalamin 1000 MCG tablet Take 1,000 mcg by mouth daily.    Marland Kitchen docusate sodium (COLACE) 100 MG capsule Take 200 mg by mouth 2 (two) times daily.    . folic acid (FOLVITE) 1 MG tablet TAKE 1 TABLET BY MOUTH EVERY DAY 30 tablet 11  . furosemide (LASIX) 20 MG tablet TAKE 1 TABLET BY MOUTH TWO  TIMES DAILY (Patient taking differently: 2 tabs po qam and 1 po qpm) 180 tablet 1  . gabapentin (NEURONTIN) 100 MG capsule TAKE 2 CAPSULES BY MOUTH 3  TIMES DAILY 540 capsule 1  . hydrocortisone (ANUSOL-HC) 25 MG suppository Place 1 suppository (25 mg total) rectally every 12 (twelve)  hours. 12 suppository 1  . ipratropium-albuterol (DUONEB) 0.5-2.5 (3) MG/3ML SOLN Take 3 mLs by nebulization every 6 (six) hours as needed (sob, wheeze). 360 mL 0  . ondansetron (ZOFRAN) 4 MG tablet TAKE 1 TABLET BY MOUTH  EVERY 8 HOURS AS NEEDED FOR NAUSEA AND VOMITING 60 tablet 3  . pantoprazole (PROTONIX) 40 MG tablet Take 1 tablet (40 mg total) by mouth 2 (two) times daily. 180 tablet 3  . potassium chloride SA (K-DUR,KLOR-CON) 20 MEQ tablet Take 20 mEq by mouth 2 (two) times daily. 1 tab po qam and 1 po qpm every other day    . prednisoLONE acetate (PRED FORTE) 1 % ophthalmic suspension Place 1 drop into both eyes 4 (four) times daily.     . sodium chloride (MURO 128) 2 % ophthalmic solution Place 1 drop into both eyes 3 (three) times daily.     . traMADol-acetaminophen (ULTRACET) 37.5-325 MG tablet Take 1 tablet by mouth every 4 (four) hours as needed. for pain 30 tablet 0  . clopidogrel (PLAVIX) 75 MG tablet Take 1 tablet (75 mg total) by mouth daily with breakfast. (Patient not taking: Reported on 06/11/2018) 90 tablet 3   No current facility-administered medications on file prior to visit.    Allergies  Allergen Reactions  . Codeine Diarrhea and Nausea Only  . Tape Other (See Comments)    SKIN IS VERY THIN AND TEARS AND BRUISES EASILY; PLEASE USE COBAN WRAP!!  . Diona Fanti [Aspirin] Rash and Other (See Comments)    Petechia   . Iron Nausea And Vomiting    Oral iron causes nausea and vomiting  . Penicillins Rash    Has patient had a PCN reaction causing immediate rash, facial/tongue/throat swelling, SOB or lightheadedness with hypotension: Yes Has patient had a PCN reaction causing severe rash involving mucus membranes or skin necrosis: Unknown Has patient had a PCN reaction that required hospitalization: No Has patient had a PCN reaction occurring within the last 10 years: No If all of the above answers are "NO", then may proceed with Cephalosporin use.    Social History    Socioeconomic History  . Marital status: Widowed    Spouse name: Not on file  . Number of children: 5  . Years of education: 6  . Highest education level: Not on file  Occupational History    Employer: RETIRED  Social Needs  . Financial resource strain: Not on file  . Food insecurity:    Worry: Not on file    Inability: Not on file  . Transportation needs:    Medical: Not on file  Non-medical: Not on file  Tobacco Use  . Smoking status: Former Smoker    Packs/day: 1.50    Years: 20.00    Pack years: 30.00    Types: Cigarettes    Last attempt to quit: 07/05/1975    Years since quitting: 42.9  . Smokeless tobacco: Never Used  . Tobacco comment: Quit smoking in 1975  Substance and Sexual Activity  . Alcohol use: No  . Drug use: No  . Sexual activity: Never  Lifestyle  . Physical activity:    Days per week: Not on file    Minutes per session: Not on file  . Stress: Not on file  Relationships  . Social connections:    Talks on phone: Not on file    Gets together: Not on file    Attends religious service: Not on file    Active member of club or organization: Not on file    Attends meetings of clubs or organizations: Not on file    Relationship status: Not on file  . Intimate partner violence:    Fear of current or ex partner: Not on file    Emotionally abused: Not on file    Physically abused: Not on file    Forced sexual activity: Not on file  Other Topics Concern  . Not on file  Social History Narrative  . Not on file      Review of Systems  All other systems reviewed and are negative.      Objective:   Physical Exam  Constitutional: She appears well-developed and well-nourished.  Neck: No JVD present.  Cardiovascular: Normal rate and normal heart sounds. An irregularly irregular rhythm present.  Pulmonary/Chest: Effort normal. No tachypnea. No respiratory distress. She has no wheezes. She has rales in the right lower field and the left lower field.  She exhibits no tenderness.  Abdominal: Soft. Bowel sounds are normal. She exhibits no distension and no mass. There is no tenderness. There is no rebound and no guarding.  Musculoskeletal: She exhibits no edema.  Lymphadenopathy:    She has no cervical adenopathy.  Vitals reviewed.        Assessment & Plan:  CKD (chronic kidney disease) stage 4, GFR 15-29 ml/min (HCC) - Plan: BASIC METABOLIC PANEL WITH GFR  Iron deficiency anemia, unspecified iron deficiency anemia type  Chronic systolic congestive heart failure ([place])  I had a long discussion today with the patient and her daughter.  I believe that her body is gradually slowly shutting down due to age.  I anticipate life expectancy to be less than 12 months and more likely to be less than 6 months.  She requires 24/7 coverage due to fall risk.  Her blood pressure is extremely low.  She is unable to tolerate carvedilol and has been off the medication now for several weeks since discharge from the nursing facility.  She appears to have additional fluid in her legs and in her pulmonary exam today however she is also showing evidence of prerenal azotemia.  Therefore since she is asymptomatic I would not increase her diuretic at the present time due to the risk of acute kidney injury and hospitalization unless she develops worsening shortness of breath.  We discussed possible need for placement given the risk of caregiver burnout.  I had a long discussion with the family on how to do that.  The patient would benefit from a skilled nursing facility given her weakness.  At the present time there continue to try  to care for her at home.  I will check a CBC and a CMP today.  If her situation worsens, we may need to consider hospice for end-of-life care.

## 2018-06-14 DIAGNOSIS — I2510 Atherosclerotic heart disease of native coronary artery without angina pectoris: Secondary | ICD-10-CM | POA: Diagnosis not present

## 2018-06-14 DIAGNOSIS — I5023 Acute on chronic systolic (congestive) heart failure: Secondary | ICD-10-CM | POA: Diagnosis not present

## 2018-06-14 DIAGNOSIS — D509 Iron deficiency anemia, unspecified: Secondary | ICD-10-CM | POA: Diagnosis not present

## 2018-06-14 DIAGNOSIS — I482 Chronic atrial fibrillation, unspecified: Secondary | ICD-10-CM | POA: Diagnosis not present

## 2018-06-14 DIAGNOSIS — N184 Chronic kidney disease, stage 4 (severe): Secondary | ICD-10-CM | POA: Diagnosis not present

## 2018-06-14 DIAGNOSIS — I130 Hypertensive heart and chronic kidney disease with heart failure and stage 1 through stage 4 chronic kidney disease, or unspecified chronic kidney disease: Secondary | ICD-10-CM | POA: Diagnosis not present

## 2018-06-15 ENCOUNTER — Ambulatory Visit: Payer: Medicare Other | Admitting: Internal Medicine

## 2018-06-16 DIAGNOSIS — I5023 Acute on chronic systolic (congestive) heart failure: Secondary | ICD-10-CM | POA: Diagnosis not present

## 2018-06-16 DIAGNOSIS — D509 Iron deficiency anemia, unspecified: Secondary | ICD-10-CM | POA: Diagnosis not present

## 2018-06-16 DIAGNOSIS — N184 Chronic kidney disease, stage 4 (severe): Secondary | ICD-10-CM | POA: Diagnosis not present

## 2018-06-16 DIAGNOSIS — I482 Chronic atrial fibrillation, unspecified: Secondary | ICD-10-CM | POA: Diagnosis not present

## 2018-06-16 DIAGNOSIS — I130 Hypertensive heart and chronic kidney disease with heart failure and stage 1 through stage 4 chronic kidney disease, or unspecified chronic kidney disease: Secondary | ICD-10-CM | POA: Diagnosis not present

## 2018-06-16 DIAGNOSIS — I2510 Atherosclerotic heart disease of native coronary artery without angina pectoris: Secondary | ICD-10-CM | POA: Diagnosis not present

## 2018-06-21 DIAGNOSIS — I2510 Atherosclerotic heart disease of native coronary artery without angina pectoris: Secondary | ICD-10-CM | POA: Diagnosis not present

## 2018-06-21 DIAGNOSIS — D509 Iron deficiency anemia, unspecified: Secondary | ICD-10-CM | POA: Diagnosis not present

## 2018-06-21 DIAGNOSIS — I5023 Acute on chronic systolic (congestive) heart failure: Secondary | ICD-10-CM | POA: Diagnosis not present

## 2018-06-21 DIAGNOSIS — N184 Chronic kidney disease, stage 4 (severe): Secondary | ICD-10-CM | POA: Diagnosis not present

## 2018-06-21 DIAGNOSIS — I130 Hypertensive heart and chronic kidney disease with heart failure and stage 1 through stage 4 chronic kidney disease, or unspecified chronic kidney disease: Secondary | ICD-10-CM | POA: Diagnosis not present

## 2018-06-21 DIAGNOSIS — I482 Chronic atrial fibrillation, unspecified: Secondary | ICD-10-CM | POA: Diagnosis not present

## 2018-06-24 DIAGNOSIS — I130 Hypertensive heart and chronic kidney disease with heart failure and stage 1 through stage 4 chronic kidney disease, or unspecified chronic kidney disease: Secondary | ICD-10-CM | POA: Diagnosis not present

## 2018-06-24 DIAGNOSIS — N184 Chronic kidney disease, stage 4 (severe): Secondary | ICD-10-CM | POA: Diagnosis not present

## 2018-06-24 DIAGNOSIS — D509 Iron deficiency anemia, unspecified: Secondary | ICD-10-CM | POA: Diagnosis not present

## 2018-06-24 DIAGNOSIS — I2510 Atherosclerotic heart disease of native coronary artery without angina pectoris: Secondary | ICD-10-CM | POA: Diagnosis not present

## 2018-06-24 DIAGNOSIS — I5023 Acute on chronic systolic (congestive) heart failure: Secondary | ICD-10-CM | POA: Diagnosis not present

## 2018-06-24 DIAGNOSIS — I482 Chronic atrial fibrillation, unspecified: Secondary | ICD-10-CM | POA: Diagnosis not present

## 2018-06-25 ENCOUNTER — Other Ambulatory Visit: Payer: Self-pay | Admitting: Family Medicine

## 2018-06-25 NOTE — Telephone Encounter (Signed)
Ok to refill??  Last office visit 06/11/2018.  Last refill 05/14/2018.

## 2018-06-28 DIAGNOSIS — I2510 Atherosclerotic heart disease of native coronary artery without angina pectoris: Secondary | ICD-10-CM | POA: Diagnosis not present

## 2018-06-28 DIAGNOSIS — I482 Chronic atrial fibrillation, unspecified: Secondary | ICD-10-CM | POA: Diagnosis not present

## 2018-06-28 DIAGNOSIS — D509 Iron deficiency anemia, unspecified: Secondary | ICD-10-CM | POA: Diagnosis not present

## 2018-06-28 DIAGNOSIS — I130 Hypertensive heart and chronic kidney disease with heart failure and stage 1 through stage 4 chronic kidney disease, or unspecified chronic kidney disease: Secondary | ICD-10-CM | POA: Diagnosis not present

## 2018-06-28 DIAGNOSIS — N184 Chronic kidney disease, stage 4 (severe): Secondary | ICD-10-CM | POA: Diagnosis not present

## 2018-06-28 DIAGNOSIS — I5023 Acute on chronic systolic (congestive) heart failure: Secondary | ICD-10-CM | POA: Diagnosis not present

## 2018-07-05 DIAGNOSIS — I130 Hypertensive heart and chronic kidney disease with heart failure and stage 1 through stage 4 chronic kidney disease, or unspecified chronic kidney disease: Secondary | ICD-10-CM | POA: Diagnosis not present

## 2018-07-05 DIAGNOSIS — D509 Iron deficiency anemia, unspecified: Secondary | ICD-10-CM | POA: Diagnosis not present

## 2018-07-05 DIAGNOSIS — I5023 Acute on chronic systolic (congestive) heart failure: Secondary | ICD-10-CM | POA: Diagnosis not present

## 2018-07-05 DIAGNOSIS — N184 Chronic kidney disease, stage 4 (severe): Secondary | ICD-10-CM | POA: Diagnosis not present

## 2018-07-05 DIAGNOSIS — I482 Chronic atrial fibrillation, unspecified: Secondary | ICD-10-CM | POA: Diagnosis not present

## 2018-07-05 DIAGNOSIS — I2510 Atherosclerotic heart disease of native coronary artery without angina pectoris: Secondary | ICD-10-CM | POA: Diagnosis not present

## 2018-07-12 DIAGNOSIS — I130 Hypertensive heart and chronic kidney disease with heart failure and stage 1 through stage 4 chronic kidney disease, or unspecified chronic kidney disease: Secondary | ICD-10-CM | POA: Diagnosis not present

## 2018-07-12 DIAGNOSIS — I2510 Atherosclerotic heart disease of native coronary artery without angina pectoris: Secondary | ICD-10-CM | POA: Diagnosis not present

## 2018-07-12 DIAGNOSIS — D509 Iron deficiency anemia, unspecified: Secondary | ICD-10-CM | POA: Diagnosis not present

## 2018-07-12 DIAGNOSIS — N184 Chronic kidney disease, stage 4 (severe): Secondary | ICD-10-CM | POA: Diagnosis not present

## 2018-07-12 DIAGNOSIS — I482 Chronic atrial fibrillation, unspecified: Secondary | ICD-10-CM | POA: Diagnosis not present

## 2018-07-12 DIAGNOSIS — I5023 Acute on chronic systolic (congestive) heart failure: Secondary | ICD-10-CM | POA: Diagnosis not present

## 2018-07-15 ENCOUNTER — Other Ambulatory Visit: Payer: Self-pay | Admitting: Family Medicine

## 2018-07-15 DIAGNOSIS — E7211 Homocystinuria: Principal | ICD-10-CM

## 2018-07-15 DIAGNOSIS — R7989 Other specified abnormal findings of blood chemistry: Secondary | ICD-10-CM

## 2018-07-19 ENCOUNTER — Telehealth: Payer: Self-pay | Admitting: Family Medicine

## 2018-07-19 DIAGNOSIS — I2510 Atherosclerotic heart disease of native coronary artery without angina pectoris: Secondary | ICD-10-CM | POA: Diagnosis not present

## 2018-07-19 DIAGNOSIS — I482 Chronic atrial fibrillation, unspecified: Secondary | ICD-10-CM | POA: Diagnosis not present

## 2018-07-19 DIAGNOSIS — N184 Chronic kidney disease, stage 4 (severe): Secondary | ICD-10-CM | POA: Diagnosis not present

## 2018-07-19 DIAGNOSIS — I130 Hypertensive heart and chronic kidney disease with heart failure and stage 1 through stage 4 chronic kidney disease, or unspecified chronic kidney disease: Secondary | ICD-10-CM | POA: Diagnosis not present

## 2018-07-19 DIAGNOSIS — D509 Iron deficiency anemia, unspecified: Secondary | ICD-10-CM | POA: Diagnosis not present

## 2018-07-19 DIAGNOSIS — I5023 Acute on chronic systolic (congestive) heart failure: Secondary | ICD-10-CM | POA: Diagnosis not present

## 2018-07-19 NOTE — Telephone Encounter (Signed)
Home Health nurse called and states that pt is having some mid sternum chest pain - no SOB or pain in left arm. No other symptoms noted. Pt is currently taking med for reflux bid and has apt with cardiologist on Friday. States that she had an episode last PM but it went aware and one this AM that also spontaneously resolved. Informed nurse to tell them if she starts having SOB, worsening CP or pain that radiates down left arm to take her to the ER otherwise just monitor for now. Nurse verbalized understanding and will inform pt's daughters and caregivers.

## 2018-07-21 ENCOUNTER — Encounter: Payer: Self-pay | Admitting: Family Medicine

## 2018-07-23 ENCOUNTER — Ambulatory Visit (INDEPENDENT_AMBULATORY_CARE_PROVIDER_SITE_OTHER): Payer: Medicare Other | Admitting: Cardiovascular Disease

## 2018-07-23 ENCOUNTER — Encounter: Payer: Self-pay | Admitting: Cardiovascular Disease

## 2018-07-23 ENCOUNTER — Other Ambulatory Visit (HOSPITAL_COMMUNITY): Payer: Medicare Other

## 2018-07-23 ENCOUNTER — Telehealth: Payer: Self-pay | Admitting: Family Medicine

## 2018-07-23 VITALS — BP 115/64 | HR 71 | Ht 65.0 in | Wt 162.0 lb

## 2018-07-23 DIAGNOSIS — I351 Nonrheumatic aortic (valve) insufficiency: Secondary | ICD-10-CM

## 2018-07-23 DIAGNOSIS — I712 Thoracic aortic aneurysm, without rupture: Secondary | ICD-10-CM | POA: Diagnosis not present

## 2018-07-23 DIAGNOSIS — Z79899 Other long term (current) drug therapy: Secondary | ICD-10-CM

## 2018-07-23 DIAGNOSIS — I7121 Aneurysm of the ascending aorta, without rupture: Secondary | ICD-10-CM

## 2018-07-23 DIAGNOSIS — I4821 Permanent atrial fibrillation: Secondary | ICD-10-CM | POA: Diagnosis not present

## 2018-07-23 DIAGNOSIS — R600 Localized edema: Secondary | ICD-10-CM

## 2018-07-23 DIAGNOSIS — I5023 Acute on chronic systolic (congestive) heart failure: Secondary | ICD-10-CM

## 2018-07-23 DIAGNOSIS — R079 Chest pain, unspecified: Secondary | ICD-10-CM | POA: Diagnosis not present

## 2018-07-23 DIAGNOSIS — N183 Chronic kidney disease, stage 3 unspecified: Secondary | ICD-10-CM

## 2018-07-23 DIAGNOSIS — I2510 Atherosclerotic heart disease of native coronary artery without angina pectoris: Secondary | ICD-10-CM | POA: Diagnosis not present

## 2018-07-23 MED ORDER — TORSEMIDE 20 MG PO TABS: 20.0000 mg | ORAL_TABLET | Freq: Two times a day (BID) | ORAL | 3 refills | Status: DC

## 2018-07-23 NOTE — Progress Notes (Signed)
SUBJECTIVE: The patient presents for routine follow-up. She has a history of chronic systolic heart failure and chronic atrial fibrillation.  She has significant bilateral venous varicosities and has some chronic lower extremity swelling from that.  Coronary angiography on 03/18/17 demonstrated mild nonobstructive disease.  Echocardiogram 03/17/17 demonstrated moderately reduced left ventricular systolic function, LVEF 57-32%. There was mild to moderate aortic regurgitation and mild to moderate mitral regurgitation. There was moderate tricuspid regurgitation as well.  She has had significant bilateral leg swelling.  It goes up to her thighs.  She currently takes Lasix 40 mg every morning and 20 mg every evening and has been hesitant to take higher doses due to renal dysfunction.  She denies shortness of breath.  She sleeps in a lift chair for comfort.  She has episodic chest pains when lying down at night.  Her daughter is here with her.   Review of Systems: As per "subjective", otherwise negative.  Allergies  Allergen Reactions  . Codeine Diarrhea and Nausea Only  . Tape Other (See Comments)    SKIN IS VERY THIN AND TEARS AND BRUISES EASILY; PLEASE USE COBAN WRAP!!  . Diona Fanti [Aspirin] Rash and Other (See Comments)    Petechia   . Iron Nausea And Vomiting    Oral iron causes nausea and vomiting  . Penicillins Rash    Has patient had a PCN reaction causing immediate rash, facial/tongue/throat swelling, SOB or lightheadedness with hypotension: Yes Has patient had a PCN reaction causing severe rash involving mucus membranes or skin necrosis: Unknown Has patient had a PCN reaction that required hospitalization: No Has patient had a PCN reaction occurring within the last 10 years: No If all of the above answers are "NO", then may proceed with Cephalosporin use.     Current Outpatient Medications  Medication Sig Dispense Refill  . cyanocobalamin 1000 MCG tablet Take 1,000 mcg by  mouth daily.    Marland Kitchen docusate sodium (COLACE) 100 MG capsule Take 200 mg by mouth 2 (two) times daily.    . folic acid (FOLVITE) 1 MG tablet TAKE 1 TABLET BY MOUTH DAILY 30 tablet 5  . furosemide (LASIX) 20 MG tablet TAKE 1 TABLET BY MOUTH TWO  TIMES DAILY (Patient taking differently: 2 tabs po qam and 1 po qpm) 180 tablet 1  . gabapentin (NEURONTIN) 100 MG capsule TAKE 2 CAPSULES BY MOUTH 3  TIMES DAILY 540 capsule 1  . hydrocortisone (ANUSOL-HC) 25 MG suppository Place 1 suppository (25 mg total) rectally every 12 (twelve) hours. 12 suppository 1  . ondansetron (ZOFRAN) 4 MG tablet TAKE 1 TABLET BY MOUTH  EVERY 8 HOURS AS NEEDED FOR NAUSEA AND VOMITING 60 tablet 3  . pantoprazole (PROTONIX) 40 MG tablet Take 1 tablet (40 mg total) by mouth 2 (two) times daily. 180 tablet 3  . potassium chloride SA (K-DUR,KLOR-CON) 20 MEQ tablet Take 20 mEq by mouth 2 (two) times daily. 1 tab po qam and 1 po qpm every other day    . prednisoLONE acetate (PRED FORTE) 1 % ophthalmic suspension Place 1 drop into both eyes 4 (four) times daily.     . sodium chloride (MURO 128) 2 % ophthalmic solution Place 1 drop into both eyes 3 (three) times daily.     . traMADol-acetaminophen (ULTRACET) 37.5-325 MG tablet Take 1 tablet by mouth every 4 (four) hours as needed. for pain 30 tablet 0   No current facility-administered medications for this visit.     Past Medical  History:  Diagnosis Date  . Anemia in chronic kidney disease 06/02/2018  . Aortic insufficiency   . Ascending aortic aneurysm ([place])    a. 4.6cm by CT 2018 - pt deferred any further evaluation/invasive management therefore follow-up deferred.  . Bladder infection    h/o  . CHF (congestive heart failure) ([place])   . Chronic atrial fibrillation    a. not on anticoagulation due to prior GIB  . CKD (chronic kidney disease), stage III ([place])   . Coronary artery disease    a. mild nonobstructive by cath 2018.  Marland Kitchen DCIS (ductal carcinoma in situ) of breast  03/14/2013   right breast  . Dehydration    HISTORY   . Fall at home 09/10/12  . Hemorrhoids   . Hip fracture ([place])    hip surgery 2001  . History of blood clots    in leg  . History of knee surgery   . Hypertension   . Iron deficiency anemia, unspecified 03/14/2013   secondary to GI blood loss  . Kidney infection    h/o  . Melanoma of skin ([place]) 03/14/2013  . Melanoma of thigh ([place])    left  . Mini stroke ([place])   . Mitral regurgitation   . Nonischemic cardiomyopathy ([place])    a. EF 35-40% by echo in 03/2017 with cath showing nonobstructive CAD, suspected stress-induced.  . S/P colonoscopy March 2010   RMR: friable anal canal hemorrhoids, hyperplastic ascending polyp, adenomatous descending polyp   . Stomach ulcer    secondary to h.pylori, s/p treatment  . Thyroid condition   . Tricuspid regurgitation   . Venous stasis    edema    Past Surgical History:  Procedure Laterality Date  . APPENDECTOMY  1942  . BREAST LUMPECTOMY  1998  . CARDIAC CATHETERIZATION    . CATARACT EXTRACTION, BILATERAL    . CHOLECYSTECTOMY    . COLONOSCOPY  06/05/11   pancolonic diverticulosis/ileal reosion/abnormal anorectal junction s/p biopsy: path for small intestine and Ti was benign with non-villous atrophy, rectal biopsy with prominent prolapse changes, no acute inflammation  . CORNEAL TRANSPLANT Bilateral 2013  . ENTEROSCOPY N/A 06/13/2013   KPT:WSFK Gastritis/GI BLEED MOST LIKELY DUE TO DUODENAL ULCERS, AND ? AVMs  . ESOPHAGOGASTRODUODENOSCOPY  06/05/11   small hiatal hernia; + H.PYLORI GASTRITIS, s/p 5 days of Pylera, unable to finish due to N/V  . ESOPHAGOGASTRODUODENOSCOPY N/A 06/13/2013   Dr. Oneida Alar- see enteroscopy  . ESOPHAGOGASTRODUODENOSCOPY (EGD) WITH ESOPHAGEAL DILATION N/A 06/08/2013   CLE:XNTZG hiatal hernia;  otherwise normal EGD s/p dilation  . GIVENS CAPSULE STUDY N/A 06/08/2013   Procedure: GIVENS CAPSULE STUDY;  Surgeon: Daneil Dolin, MD;  [facility];  Service:  Endoscopy;  Laterality: N/A;  . HEMORRHOID BANDING    . KNEE SURGERY Right 05/2006   total right  . LEFT HEART CATH AND CORONARY ANGIOGRAPHY N/A 03/18/2017   Procedure: LEFT HEART CATH AND CORONARY ANGIOGRAPHY;  Surgeon: Wellington Hampshire, MD;  Location: [facility];  Service: Cardiovascular;  Laterality: N/A;  . LEG SURGERY    . MELANOMA EXCISION Left 08/2006   left leg excision  . NM MYOCAR PERF WALL MOTION  03/27/2010   dipyridamole; small reversible basal to mid-septal defect (?artifact), post-stress EF 55%, low risk scan   . PARTIAL HYSTERECTOMY  1976  . TONSILLECTOMY    . TOTAL HIP ARTHROPLASTY Left 2001&2004    X 2 FOR LEFT HIP  . TRANSTHORACIC ECHOCARDIOGRAM  10/06/2012   EF  55-60%, mod eccentric hypertrophy, grade 2 diastolic dysfunction; mildly calcifed AV annulus with moderate regurg; aortic root mildly dilated; LA severely dailted; PA peak pressure 79mHg  . VARICOSE VEIN SURGERY      Social History   Socioeconomic History  . Marital status: Widowed    Spouse name: Not on file  . Number of children: 5  . Years of education: 6  . Highest education level: Not on file  Occupational History    Employer: RETIRED  Social Needs  . Financial resource strain: Not on file  . Food insecurity:    Worry: Not on file    Inability: Not on file  . Transportation needs:    Medical: Not on file    Non-medical: Not on file  Tobacco Use  . Smoking status: Former Smoker    Packs/day: 1.50    Years: 20.00    Pack years: 30.00    Types: Cigarettes    Last attempt to quit: 07/05/1975    Years since quitting: 43.0  . Smokeless tobacco: Never Used  . Tobacco comment: Quit smoking in 1975  Substance and Sexual Activity  . Alcohol use: No  . Drug use: No  . Sexual activity: Never  Lifestyle  . Physical activity:    Days per week: Not on file    Minutes per session: Not on file  . Stress: Not on file  Relationships  . Social connections:    Talks on phone: Not on file     Gets together: Not on file    Attends religious service: Not on file    Active member of club or organization: Not on file    Attends meetings of clubs or organizations: Not on file    Relationship status: Not on file  . Intimate partner violence:    Fear of current or ex partner: Not on file    Emotionally abused: Not on file    Physically abused: Not on file    Forced sexual activity: Not on file  Other Topics Concern  . Not on file  Social History Narrative  . Not on file     Vitals:   07/23/18 0955  BP: 115/64  Pulse: 71  SpO2: 94%  Weight: 162 lb (73.5 kg)  Height: 5\' 5"  (1.651 m)    Wt Readings from Last 3 Encounters:  07/23/18 162 lb (73.5 kg)  06/11/18 154 lb (69.9 kg)  06/02/18 155 lb 3.2 oz (70.4 kg)     PHYSICAL EXAM General: NAD HEENT: Normal. Neck: No JVD, no thyromegaly. Lungs: Clear to auscultation bilaterally with normal respiratory effort. CV: Regular rate andirregularrhythm, normal S1/S2, no N2,3/5 systolicmurmur along left sternal border and 2/4 holodiastolic murmur along left sternal border.Bilateral venous varicosities with 2+ pitting pedal edema extending above the knees. Abdomen: Soft, nontender, no distention.  Neurologic: Alert and oriented.  Psych: Normal affect. Skin: Normal. Musculoskeletal: No gross deformities.    ECG: Reviewed above under Subjective   Labs:    Lipids: Lab Results  Component Value Date/Time   LDLCALC 119 (H) 03/17/2017 01:50 AM   CHOL 189 03/17/2017 01:50 AM   TRIG 73 03/17/2017 01:50 AM   HDL 55 03/17/2017 01:50 AM       ASSESSMENT AND PLAN:  1.  Acute on chronic systolic heart failure/stress induced cardiomyopathy:  Weight up 8 pounds from 06/11/2018. She has venous insufficiency and has undergone vein stripping and this is also contributing to leg edema.  The importance of a low-sodium diet  was emphasized.  She did not tolerate compression stockings.  ACE wraps have been recommended. Blood pressure  is in the low normal range.  This has limited aggressive diuresis.  BUN 24 and creatinine 1.28 on 06/11/2018.   I will switch Lasix to torsemide 20 mg twice daily.  I have instructed her to take potassium chloride twice daily.  I will check a basic metabolic panel within the next few days.  I asked her daughter to call me back in 3 days with an update on her mother's leg swelling and weight.  2. Aortic root dilatation/thoracic aortic aneurysm with mild to moderate aortic regurgitation:Given her age she does not want any interventions performed if one is needed in the future. At this time, no further testing is warranted.  3. Permanent atrial fibrillation: Not an anticoagulation candidate due to prior GI bleeding. Takes Plavix for history of TIA and aspirin intolerance. Symptomatically stable on carvedilol. No changes to therapy.  4.  Chronic kidney disease stage III: BUN 24 and creatinine 1.28 on 06/11/2018.   I will check a basic metabolic panel within the next several few days as I am switching Lasix to torsemide.  5.  Chest pains: I suspect this is related to GERD as it occurred while lying down.  Coronary angiography in 2018 demonstrated mild nonobstructive disease.   Disposition: Follow up 3 months   Kate Sable, M.D., F.A.C.C.

## 2018-07-23 NOTE — Telephone Encounter (Signed)
Notes and ordered faxed

## 2018-07-23 NOTE — Telephone Encounter (Signed)
Daughter requesting a Hospital bad for pt - They use [facility]

## 2018-07-23 NOTE — Patient Instructions (Addendum)
Medication Instructions:  STOP LASIX  START Toresmide 20 mg twice a day  If you need a refill on your cardiac medications before your next appointment, please call your pharmacy.   Lab work: BMET on Monday  07/26/18 If you have labs (blood work) drawn today and your tests are completely normal, you will receive your results only by: Marland Kitchen MyChart Message (if you have MyChart) OR . A paper copy in the mail If you have any lab test that is abnormal or we need to change your treatment, we will call you to review the results.  Testing/Procedures: None ordered  Follow-Up: At [facility], you and your health needs are our priority.  As part of our continuing mission to provide you with exceptional heart care, we have created designated Provider Care Teams.  These Care Teams include your primary Cardiologist (physician) and Advanced Practice Providers (APPs -  Physician Assistants and Nurse Practitioners) who all work together to provide you with the care you need, when you need it. You will need a follow up appointment in 3 months.  Please call our office 2 months in advance to schedule this appointment.  You may see Kate Sable, MD or one of the following Advanced Practice Providers on your designated Care Team:   Bernerd Pho, PA-C [facility]) . Ermalinda Barrios, PA-C ([place])  Any Other Special Instructions Will Be Listed Below (If Applicable).   Call us next week with weights and updates on swelling

## 2018-07-26 ENCOUNTER — Other Ambulatory Visit (HOSPITAL_COMMUNITY): Payer: Medicare Other

## 2018-07-26 ENCOUNTER — Telehealth: Payer: Self-pay | Admitting: Cardiovascular Disease

## 2018-07-26 NOTE — Telephone Encounter (Signed)
Daughter called to update office on pt. Weight today is 161 lbs. She down one pound from Friday. Daughter states that legs and ankles are still swollen but not as much as before. Pt was unable to have lab work done today but will have done in the morning.

## 2018-07-26 NOTE — Telephone Encounter (Signed)
Please give pt's daughter Helene Kelp a call concerning weight and swelling. [phone]

## 2018-07-27 DIAGNOSIS — I482 Chronic atrial fibrillation, unspecified: Secondary | ICD-10-CM | POA: Diagnosis not present

## 2018-07-27 DIAGNOSIS — I5023 Acute on chronic systolic (congestive) heart failure: Secondary | ICD-10-CM | POA: Diagnosis not present

## 2018-07-27 DIAGNOSIS — D509 Iron deficiency anemia, unspecified: Secondary | ICD-10-CM | POA: Diagnosis not present

## 2018-07-27 DIAGNOSIS — I130 Hypertensive heart and chronic kidney disease with heart failure and stage 1 through stage 4 chronic kidney disease, or unspecified chronic kidney disease: Secondary | ICD-10-CM | POA: Diagnosis not present

## 2018-07-27 DIAGNOSIS — I2510 Atherosclerotic heart disease of native coronary artery without angina pectoris: Secondary | ICD-10-CM | POA: Diagnosis not present

## 2018-07-27 DIAGNOSIS — N184 Chronic kidney disease, stage 4 (severe): Secondary | ICD-10-CM | POA: Diagnosis not present

## 2018-07-27 NOTE — Telephone Encounter (Signed)
Daughter notified and voiced understanding

## 2018-07-27 NOTE — Telephone Encounter (Signed)
Sounds like starting to move in right direction regarding her fluid status. We will f/u labs, have her update [place] again on Friday.   J Zori Benbrook MD

## 2018-07-28 ENCOUNTER — Other Ambulatory Visit: Payer: Self-pay | Admitting: Family Medicine

## 2018-07-29 NOTE — Telephone Encounter (Signed)
Ok to refill??  Last office visit 06/11/2018.  Last refill 05/14/2018.

## 2018-07-30 ENCOUNTER — Other Ambulatory Visit (HOSPITAL_COMMUNITY)
Admission: RE | Admit: 2018-07-30 | Discharge: 2018-07-30 | Disposition: A | Discharge status: Home / Self Care | Payer: Medicare Other | Source: Ambulatory Visit | Attending: Cardiovascular Disease | Admitting: Cardiovascular Disease

## 2018-07-30 ENCOUNTER — Inpatient Hospital Stay (HOSPITAL_COMMUNITY): Payer: Medicare Other | Attending: Internal Medicine

## 2018-07-30 ENCOUNTER — Other Ambulatory Visit (HOSPITAL_COMMUNITY): Payer: Self-pay | Admitting: Internal Medicine

## 2018-07-30 DIAGNOSIS — D509 Iron deficiency anemia, unspecified: Secondary | ICD-10-CM | POA: Insufficient documentation

## 2018-07-30 DIAGNOSIS — D500 Iron deficiency anemia secondary to blood loss (chronic): Secondary | ICD-10-CM

## 2018-07-30 DIAGNOSIS — Z79899 Other long term (current) drug therapy: Secondary | ICD-10-CM | POA: Diagnosis present

## 2018-07-30 LAB — BASIC METABOLIC PANEL
Anion gap: 8 (ref 5–15)
BUN: 31 mg/dL — ABNORMAL HIGH (ref 8–23)
CO2: 28 mmol/L (ref 22–32)
Calcium: 8.1 mg/dL — ABNORMAL LOW (ref 8.9–10.3)
Chloride: 103 mmol/L (ref 98–111)
Creatinine, Ser: 1.64 mg/dL — ABNORMAL HIGH (ref 0.44–1.00)
GFR calc Af Amer: 31 mL/min — ABNORMAL LOW (ref >60)
GFR calc non Af Amer: 27 mL/min — ABNORMAL LOW (ref >60)
Glucose, Bld: 110 mg/dL — ABNORMAL HIGH (ref 70–99)
Potassium: 4.3 mmol/L (ref 3.5–5.1)
Sodium: 139 mmol/L (ref 135–145)

## 2018-07-30 LAB — LACTATE DEHYDROGENASE: LDH: 142 U/L (ref 98–192)

## 2018-07-30 LAB — CBC WITH DIFFERENTIAL/PLATELET
ABS IMMATURE GRANULOCYTES: 0.01 10*3/uL (ref 0.00–0.07)
BASOS PCT: 1 %
Basophils Absolute: 0 10*3/uL (ref 0.0–0.1)
Eosinophils Absolute: 0.2 10*3/uL (ref 0.0–0.5)
Eosinophils Relative: 4 %
HCT: 29.7 % — ABNORMAL LOW (ref 36.0–46.0)
Hemoglobin: 8.3 g/dL — ABNORMAL LOW (ref 12.0–15.0)
IMMATURE GRANULOCYTES: 0 %
Lymphocytes Relative: 36 %
Lymphs Abs: 1.8 10*3/uL (ref 0.7–4.0)
MCH: 24.1 pg — ABNORMAL LOW (ref 26.0–34.0)
MCHC: 27.9 g/dL — ABNORMAL LOW (ref 30.0–36.0)
MCV: 86.3 fL (ref 80.0–100.0)
MONOS PCT: 10 %
Monocytes Absolute: 0.5 10*3/uL (ref 0.1–1.0)
NEUTROS ABS: 2.5 10*3/uL (ref 1.7–7.7)
NEUTROS PCT: 49 %
PLATELETS: 201 10*3/uL (ref 150–400)
RBC: 3.44 MIL/uL — ABNORMAL LOW (ref 3.87–5.11)
RDW: 17.2 % — ABNORMAL HIGH (ref 11.5–15.5)
WBC: 5 10*3/uL (ref 4.0–10.5)
nRBC: 0 % (ref 0.0–0.2)

## 2018-07-30 LAB — COMPREHENSIVE METABOLIC PANEL
ALT: 8 U/L (ref 0–44)
AST: 16 U/L (ref 15–41)
Albumin: 3.4 g/dL — ABNORMAL LOW (ref 3.5–5.0)
Alkaline Phosphatase: 62 U/L (ref 38–126)
Anion gap: 7 (ref 5–15)
BUN: 31 mg/dL — AB (ref 8–23)
CHLORIDE: 102 mmol/L (ref 98–111)
CO2: 29 mmol/L (ref 22–32)
CREATININE: 1.59 mg/dL — AB (ref 0.44–1.00)
Calcium: 7.9 mg/dL — ABNORMAL LOW (ref 8.9–10.3)
GFR calc Af Amer: 32 mL/min — ABNORMAL LOW (ref >60)
GFR calc non Af Amer: 28 mL/min — ABNORMAL LOW (ref >60)
GLUCOSE: 109 mg/dL — AB (ref 70–99)
Potassium: 4.2 mmol/L (ref 3.5–5.1)
SODIUM: 138 mmol/L (ref 135–145)
Total Bilirubin: 0.6 mg/dL (ref 0.3–1.2)
Total Protein: 7 g/dL (ref 6.5–8.1)

## 2018-07-30 LAB — VITAMIN B12: VITAMIN B 12: 1756 pg/mL — AB (ref 180–914)

## 2018-07-30 LAB — FERRITIN: Ferritin: 9 ng/mL — ABNORMAL LOW (ref 11–307)

## 2018-07-30 LAB — FOLATE: Folate: 114.8 ng/mL (ref >5.9)

## 2018-08-01 LAB — METHYLMALONIC ACID, SERUM: Methylmalonic Acid, Quantitative: 337 nmol/L (ref 0–378)

## 2018-08-02 ENCOUNTER — Other Ambulatory Visit (HOSPITAL_COMMUNITY): Payer: Medicare Other

## 2018-08-02 ENCOUNTER — Ambulatory Visit (HOSPITAL_COMMUNITY): Payer: Medicare Other | Admitting: Internal Medicine

## 2018-08-02 LAB — PROTEIN ELECTROPHORESIS, SERUM
A/G Ratio: 1.1 (ref 0.7–1.7)
Albumin ELP: 3.2 g/dL (ref 2.9–4.4)
Alpha-1-Globulin: 0.3 g/dL (ref 0.0–0.4)
Alpha-2-Globulin: 0.7 g/dL (ref 0.4–1.0)
Beta Globulin: 1 g/dL (ref 0.7–1.3)
Gamma Globulin: 1 g/dL (ref 0.4–1.8)
Globulin, Total: 3 g/dL (ref 2.2–3.9)
TOTAL PROTEIN ELP: 6.2 g/dL (ref 6.0–8.5)

## 2018-08-03 ENCOUNTER — Telehealth: Payer: Self-pay

## 2018-08-03 DIAGNOSIS — Z79899 Other long term (current) drug therapy: Secondary | ICD-10-CM

## 2018-08-03 MED ORDER — TORSEMIDE 20 MG PO TABS: ORAL_TABLET | ORAL | 3 refills | Status: AC

## 2018-08-03 NOTE — Telephone Encounter (Signed)
-----   Message from Herminio Commons, MD sent at 08/03/2018  9:45 AM EST ----- Have patient take torsemide 20 mg daily and to weigh herself daily.  If weight increases by 3 pounds in 24 hours, take an extra 20 mg.  Repeat basic metabolic panel in 2 weeks.

## 2018-08-03 NOTE — Telephone Encounter (Signed)
Daughter will decrease torsemide to 20 mg daily and take an extra 20 mg if she gains more than 3 lbs overnight, will pick up lab slip at office

## 2018-08-09 ENCOUNTER — Telehealth: Payer: Self-pay | Admitting: Cardiovascular Disease

## 2018-08-09 ENCOUNTER — Other Ambulatory Visit: Payer: Self-pay

## 2018-08-09 ENCOUNTER — Encounter (HOSPITAL_COMMUNITY): Payer: Self-pay | Admitting: Internal Medicine

## 2018-08-09 ENCOUNTER — Inpatient Hospital Stay (HOSPITAL_COMMUNITY): Payer: Medicare Other

## 2018-08-09 ENCOUNTER — Inpatient Hospital Stay (HOSPITAL_COMMUNITY): Payer: Medicare Other | Attending: Hematology | Admitting: Internal Medicine

## 2018-08-09 VITALS — BP 106/50 | HR 73 | Temp 97.8°F | Resp 14 | Wt 168.3 lb

## 2018-08-09 DIAGNOSIS — D631 Anemia in chronic kidney disease: Secondary | ICD-10-CM

## 2018-08-09 DIAGNOSIS — D500 Iron deficiency anemia secondary to blood loss (chronic): Secondary | ICD-10-CM | POA: Diagnosis not present

## 2018-08-09 DIAGNOSIS — R635 Abnormal weight gain: Secondary | ICD-10-CM

## 2018-08-09 DIAGNOSIS — Z86000 Personal history of in-situ neoplasm of breast: Secondary | ICD-10-CM | POA: Diagnosis not present

## 2018-08-09 DIAGNOSIS — Z85820 Personal history of malignant melanoma of skin: Secondary | ICD-10-CM

## 2018-08-09 DIAGNOSIS — Z923 Personal history of irradiation: Secondary | ICD-10-CM | POA: Diagnosis not present

## 2018-08-09 DIAGNOSIS — Z79899 Other long term (current) drug therapy: Secondary | ICD-10-CM | POA: Diagnosis not present

## 2018-08-09 DIAGNOSIS — Z87891 Personal history of nicotine dependence: Secondary | ICD-10-CM | POA: Diagnosis not present

## 2018-08-09 DIAGNOSIS — N184 Chronic kidney disease, stage 4 (severe): Secondary | ICD-10-CM

## 2018-08-09 DIAGNOSIS — K921 Melena: Secondary | ICD-10-CM

## 2018-08-09 MED ORDER — METOLAZONE 2.5 MG PO TABS: ORAL_TABLET | ORAL | 0 refills | Status: DC

## 2018-08-09 NOTE — Telephone Encounter (Signed)
Daughter will give her metolazone 2.5 mg today and tomorrow, plus BMET tomorrow

## 2018-08-09 NOTE — Telephone Encounter (Signed)
Wt today 169.2 lbs  Daughter has given to extra 20 mg doses of torsemide for past 2 days with no weight loss   She is watching sodium intake   Please advise

## 2018-08-09 NOTE — Telephone Encounter (Signed)
Give metolazone 2.5 mg today and tomorrow. Check BMET tomorrow.

## 2018-08-09 NOTE — Progress Notes (Signed)
Diagnosis Iron deficiency anemia due to chronic blood loss - Plan: CBC with Differential/Platelet, Comprehensive metabolic panel, Lactate dehydrogenase, Ferritin  Staging Cancer Staging Melanoma of skin ([place]) Staging form: Melanoma of the Skin, AJCC 7th Edition - Clinical: Stage IA (T1a, N0, M0) - Signed by [name] [name] Cancer, PA-C on 03/14/2013   Assessment and Plan:   1.  Stage I (T1a) melanoma of left thigh, superficial spreading-type:  -Diagnosed in 07/2006; treated with resection, followed by re-excision in 08/2006. Negative surgical margins noted at that time.   Pt should continue to follow-up with Dermatology as recommended.    2.  Stage 0 DCIS of right breast:  -Diagnosed in 01/1997. S/p surgical resection and patient thinks she had radiation therapy as well.   -Last mammogram on 10/06/16 negative.  Pt reports she was told she did not need ongoing imaging.    3.  Iron deficiency anemia: Thought to be secondary to GI blood loss.  Pt no longer on Plavix.  Pt is followed by Dr. Gala [name].  She was last treated with Feraheme in 04/2018.   Labs done 07/30/2018 reviewed and showed WBC 5 HB 8,3 plts 201,000.  Chemistries WNL with K+ 4.2, normal LFTs.  Ferritin decreased at 9.  Pt has an intolerance to oral iron She is recommended for Feraheme 510 mg IV D1 and D8.  She will have repeat labs 4-6 weeks after IV iron.  Side effects of iron reviewed.  Pt should follow-up with GI as recommended as daughter reports pt has  problems with blood in stool. Will transfuse if HB < 7.      4.  RI.  Cr is noted to be 1.59.    SPEP on 07/30/2018 negative.   Pt likely has anemia of chronic renal disease.  Family does not desire nephrology referral.    5.  Blood in stools.  Daughter reports this is chronic.  Pt should follow-up with GI as recommended.    6.  B12 deficiency.  B12 and MMA levels WNL on labs done 07/30/2018.  Daughter reports pt no longer on B12 supplementation.    25 minutes spent with more than  50% spent in counseling and coordination of care and review of records.    Current Status:  Pt is seen today for follow-up.  She is here to go over labs.  Pt accompanied by family member.    Problem List Patient Active Problem List   Diagnosis Date Noted  . Anemia in chronic kidney disease [N18.9, D63.1] 06/02/2018  . Acute dyspnea [R06.00]   . Aspiration pneumonia of both lower lobes due to gastric secretions ([place]) [J69.0] 04/29/2018  . Acute diverticulitis [K57.92]   . Acute on chronic systolic CHF (congestive heart failure) ([place]) [I50.23] 04/28/2018  . CHF NYHA class III, acute on chronic, systolic ([place]) [Q03.47] 42/59/5638  . Chronic pain syndrome [G89.4] 04/26/2018  . Diverticulitis large intestine [K57.32] 04/26/2018  . Chronic renal failure, stage 4 (severe) ([place]) [N18.4] 04/26/2018  . Heart failure ([place]) [I50.9] 04/04/2017  . Coronary artery disease [I25.10] 04/02/2017  . Hypertension [I10] 04/02/2017  . Stress-induced cardiomyopathy [I51.81] 03/19/2017  . Non-ST elevation (NSTEMI) myocardial infarction ([place]) [I21.4]   . Chest pain [R07.9] 03/16/2017  . Atrial fibrillation, chronic [I48.20] 03/16/2017  . Elevated homocysteine ([place]) [E72.11] 01/26/2017  . Vitamin B12 deficiency [E53.8] 01/26/2017  . Dysphagia [R13.10] 05/20/2016  . Hypokalemia [E87.6] 05/19/2016  . Thrombocytopenia ([place]) [D69.6] 05/19/2016  . Acute respiratory failure with hypoxia ([place]) [J96.01] 05/18/2016  .  Acute bronchitis [J20.9] 05/18/2016  . Bilateral lower extremity edema [R60.0] 05/18/2016  . Degenerative joint disease involving multiple joints [M15.9] 05/18/2016  . Pyuria [R82.81] 05/18/2016  . DNR (do not resuscitate) [Z66] 05/18/2016  . Constipation [K59.00] 02/09/2015  . Occult GI bleeding [R19.5] 08/23/2013  . IDA (iron deficiency anemia) [D50.9] 06/07/2013  . Melanoma of skin ([place]) [C43.9] 03/14/2013  . DCIS (ductal carcinoma in situ) of breast [D05.10] 03/14/2013  . BPPV (benign paroxysmal  positional vertigo) [H81.10] 02/10/2013  . RBBB [I45.10] 02/10/2013  . Ascending aortic aneurysm ([place]) [I71.2] 02/10/2013  . Aortic insufficiency [I35.1] 02/10/2013  . S/P total knee replacement [Z96.659] 08/14/2011  . Helicobacter pylori gastritis [K29.70, B96.81] 07/14/2011  . Anemia [D64.9] 02/04/2011  . Bladder infection [N30.90]   . [name] [name], [name] [name] 05/29/2009    Past Medical History Past Medical History:  Diagnosis Date  . Anemia in chronic kidney disease 06/02/2018  . Aortic insufficiency   . Ascending aortic aneurysm ([place])    a. 4.6cm by CT 2018 - pt deferred any further evaluation/invasive management therefore follow-up deferred.  . Bladder infection    h/o  . CHF (congestive heart failure) ([place])   . Chronic atrial fibrillation    a. not on anticoagulation due to prior GIB  . CKD (chronic kidney disease), stage III ([place])   . Coronary artery disease    a. mild nonobstructive by cath 2018.  Marland Kitchen DCIS (ductal carcinoma in situ) of breast 03/14/2013   right breast  . Dehydration    HISTORY   . Fall at home 09/10/12  . Hemorrhoids   . Hip fracture ([place])    hip surgery 2001  . History of blood clots    in leg  . History of knee surgery   . Hypertension   . Iron deficiency anemia, unspecified 03/14/2013   secondary to GI blood loss  . Kidney infection    h/o  . Melanoma of skin ([place]) 03/14/2013  . Melanoma of thigh ([place])    left  . Mini stroke ([place])   . Mitral regurgitation   . Nonischemic cardiomyopathy ([place])    a. EF 35-40% by echo in 03/2017 with cath showing nonobstructive CAD, suspected stress-induced.  . S/P colonoscopy March 2010   RMR: friable anal canal hemorrhoids, hyperplastic ascending polyp, adenomatous descending polyp   . Stomach ulcer    secondary to h.pylori, s/p treatment  . Thyroid condition   . Tricuspid regurgitation   . Venous stasis    edema    Past Surgical History Past Surgical History:  Procedure Laterality Date  . APPENDECTOMY   1942  . BREAST LUMPECTOMY  1998  . CARDIAC CATHETERIZATION    . CATARACT EXTRACTION, BILATERAL    . CHOLECYSTECTOMY    . COLONOSCOPY  06/05/11   pancolonic diverticulosis/ileal reosion/abnormal anorectal junction s/p biopsy: path for small intestine and Ti was benign with non-villous atrophy, rectal biopsy with prominent prolapse changes, no acute inflammation  . CORNEAL TRANSPLANT Bilateral 2013  . ENTEROSCOPY N/A 06/13/2013   XQJ:JHER Gastritis/GI BLEED MOST LIKELY DUE TO DUODENAL ULCERS, AND ? AVMs  . ESOPHAGOGASTRODUODENOSCOPY  06/05/11   small hiatal hernia; + H.PYLORI GASTRITIS, s/p 5 days of Pylera, unable to finish due to N/V  . ESOPHAGOGASTRODUODENOSCOPY N/A 06/13/2013   Dr. Oneida Alar- see enteroscopy  . ESOPHAGOGASTRODUODENOSCOPY (EGD) WITH ESOPHAGEAL DILATION N/A 06/08/2013   DEY:CXKGY hiatal hernia;  otherwise normal EGD s/p dilation  . GIVENS CAPSULE STUDY N/A 06/08/2013   Procedure: GIVENS CAPSULE STUDY;  Surgeon:  Daneil Dolin, MD;  [facility];  Service: Endoscopy;  Laterality: N/A;  . HEMORRHOID BANDING    . KNEE SURGERY Right 05/2006   total right  . LEFT HEART CATH AND CORONARY ANGIOGRAPHY N/A 03/18/2017   Procedure: LEFT HEART CATH AND CORONARY ANGIOGRAPHY;  Surgeon: Wellington Hampshire, MD;  Location: [facility];  Service: Cardiovascular;  Laterality: N/A;  . LEG SURGERY    . MELANOMA EXCISION Left 08/2006   left leg excision  . NM MYOCAR PERF WALL MOTION  03/27/2010   dipyridamole; small reversible basal to mid-septal defect (?artifact), post-stress EF 55%, low risk scan   . PARTIAL HYSTERECTOMY  1976  . TONSILLECTOMY    . TOTAL HIP ARTHROPLASTY Left 2001&2004    X 2 FOR LEFT HIP  . TRANSTHORACIC ECHOCARDIOGRAM  10/06/2012   EF 55-60%, mod eccentric hypertrophy, grade 2 diastolic dysfunction; mildly calcifed AV annulus with moderate regurg; aortic root mildly dilated; LA severely dailted; PA peak pressure 28mHg  . VARICOSE VEIN SURGERY      Family  History Family History  Problem Relation Age of Onset  . Breast cancer Daughter        also hyperlipidemia  . Breast cancer Daughter        also hyperlipidemia  . Asthma Mother   . Multiple sclerosis Child   . Hyperlipidemia Child   . Colon cancer Neg Hx      Social History  reports that she quit smoking about 43 years ago. Her smoking use included cigarettes. She has a 30.00 pack-year smoking history. She has never used smokeless tobacco. She reports that she does not drink alcohol or use drugs.  Medications  Current Outpatient Medications:  .  cyanocobalamin 1000 MCG tablet, Take 1,000 mcg by mouth daily., Disp: , Rfl:  .  docusate sodium (COLACE) 100 MG capsule, Take 200 mg by mouth 2 (two) times daily., Disp: , Rfl:  .  folic acid (FOLVITE) 1 MG tablet, TAKE 1 TABLET BY MOUTH DAILY, Disp: 30 tablet, Rfl: 5 .  gabapentin (NEURONTIN) 100 MG capsule, TAKE 2 CAPSULES BY MOUTH 3  TIMES DAILY, Disp: 540 capsule, Rfl: 1 .  hydrocortisone (ANUSOL-HC) 25 MG suppository, Place 1 suppository (25 mg total) rectally every 12 (twelve) hours., Disp: 12 suppository, Rfl: 1 .  metolazone (ZAROXOLYN) 2.5 MG tablet, Take 2.5 mg today and 2.5 mg tomorrow (30 minutes before torsemide), Disp: 3 tablet, Rfl: 0 .  ondansetron (ZOFRAN) 4 MG tablet, TAKE 1 TABLET BY MOUTH  EVERY 8 HOURS AS NEEDED FOR NAUSEA AND VOMITING, Disp: 60 tablet, Rfl: 3 .  pantoprazole (PROTONIX) 40 MG tablet, Take 1 tablet (40 mg total) by mouth 2 (two) times daily., Disp: 180 tablet, Rfl: 3 .  potassium chloride SA (K-DUR,KLOR-CON) 20 MEQ tablet, Take 20 mEq by mouth 2 (two) times daily. Per pt's daughter, cardiologist said to take only on the days the pt takes lasix, Disp: , Rfl:  .  prednisoLONE acetate (PRED FORTE) 1 % ophthalmic suspension, Place 1 drop into both eyes 4 (four) times daily. , Disp: , Rfl:  .  sodium chloride (MURO 128) 2 % ophthalmic solution, Place 1 drop into both eyes 3 (three) times daily. , Disp: , Rfl:  .   torsemide (DEMADEX) 20 MG tablet, Take 20 mg daily, May take extra 20 mg if weight gain in 24 hrs is over 3 lbs, Disp: 180 tablet, Rfl: 3 .  traMADol-acetaminophen (ULTRACET) 37.5-325 MG tablet, TAKE 1 TABLET  BY MOUTH EVERY 4 HOURS AS NEEDED FOR PAIN, Disp: 180 tablet, Rfl: 0  Allergies Codeine; Tape; Asa [aspirin]; Iron; and Penicillins  Review of Systems Review of Systems - Oncology ROS negative   Physical Exam  Vitals Wt Readings from Last 3 Encounters:  08/09/18 168 lb 4.8 oz (76.3 kg)  07/23/18 162 lb (73.5 kg)  06/11/18 154 lb (69.9 kg)   Temp Readings from Last 3 Encounters:  08/09/18 97.8 F (36.6 C) (Oral)  06/11/18 97.9 F (36.6 C) (Oral)  06/04/18 (!) 97.5 F (36.4 C) (Oral)   BP Readings from Last 3 Encounters:  08/09/18 (!) 106/50  07/23/18 115/64  06/11/18 (!) 100/50   Pulse Readings from Last 3 Encounters:  08/09/18 73  07/23/18 71  06/11/18 70   Constitutional: Ederly female seated in wheelchair  in no distress.   HENT: Head: Normocephalic and atraumatic.  Mouth/Throat: No oropharyngeal exudate. Mucosa moist. Eyes: Pupils are equal, round, and reactive to light. Conjunctivae are normal. No scleral icterus.  Neck: Normal range of motion. Neck supple. No JVD present.  Cardiovascular: Normal rate, regular rhythm and normal heart sounds.  Exam reveals no gallop and no friction rub.   No murmur heard. Pulmonary/Chest: Effort normal and breath sounds normal. No respiratory distress. No wheezes.No rales.  Abdominal: Soft. Bowel sounds are normal. No distension. There is no tenderness. There is no guarding.  Musculoskeletal: No edema or tenderness.  Lymphadenopathy: No cervical, axillary or supraclavicular adenopathy.  Neurological: Alert and oriented to person, place, and time. No cranial nerve deficit.  Skin: Skin is warm and dry. No rash noted. No erythema. No pallor.  Psychiatric: Affect and judgment normal.   Labs No visits with results within 3  Day(s) from this visit.  Latest known visit with results is:  Hospital Outpatient Visit on 07/30/2018  Component Date Value Ref Range Status  . Sodium 07/30/2018 139  135 - 145 mmol/L Final  . Potassium 07/30/2018 4.3  3.5 - 5.1 mmol/L Final  . Chloride 07/30/2018 103  98 - 111 mmol/L Final  . CO2 07/30/2018 28  22 - 32 mmol/L Final  . Glucose, Bld 07/30/2018 110* 70 - 99 mg/dL Final  . BUN 07/30/2018 31* 8 - 23 mg/dL Final  . Creatinine, Ser 07/30/2018 1.64* 0.44 - 1.00 mg/dL Final  . Calcium 07/30/2018 8.1* 8.9 - 10.3 mg/dL Final  . GFR calc non Af Amer 07/30/2018 27* >60 mL/min Final  . GFR calc Af Amer 07/30/2018 31* >60 mL/min Final  . Anion gap 07/30/2018 8  5 - 15 Final   Performed at [facility], [address], [place], [place] [address]     Pathology Orders Placed This Encounter  Procedures  . CBC with Differential/Platelet    Standing Status:   Future    Standing Expiration Date:   08/10/2019  . Comprehensive metabolic panel    Standing Status:   Future    Standing Expiration Date:   08/10/2019  . Lactate dehydrogenase    Standing Status:   Future    Standing Expiration Date:   08/10/2019  . Ferritin    Standing Status:   Future    Standing Expiration Date:   08/10/2019       Zoila Shutter MD

## 2018-08-09 NOTE — Patient Instructions (Signed)
[facility] at [facility]  Discharge Instructions: You saw Dr. Higgs today                               _______________________________________________________________  Thank you for choosing [facility] at [facility] to provide your oncology and hematology care.  To afford each patient quality time with our providers, please arrive at least 15 minutes before your scheduled appointment.  You need to re-schedule your appointment if you arrive 10 or more minutes late.  We strive to give you quality time with our providers, and arriving late affects you and other patients whose appointments are after yours.  Also, if you no show three or more times for appointments you may be dismissed from the clinic.  Again, thank you for choosing [facility] at [facility]. Our hope is that these requests will allow you access to exceptional care and in a timely manner. _______________________________________________________________  If you have questions after your visit, please contact our office at [phone] between the hours of 8:30 a.m. and 5:00 p.m. Voicemails left after 4:30 p.m. will not be returned until the following business day. _______________________________________________________________  For prescription refill requests, have your pharmacy contact our office. _______________________________________________________________  Recommendations made by the consultant and any test results will be sent to your referring physician. _______________________________________________________________ 

## 2018-08-09 NOTE — Telephone Encounter (Signed)
Pt has gained over 6 lbs in eight days. Please give daughter Joaquim Lai a call @ [phone]

## 2018-08-10 ENCOUNTER — Other Ambulatory Visit: Payer: Self-pay

## 2018-08-10 ENCOUNTER — Inpatient Hospital Stay (HOSPITAL_COMMUNITY): Payer: Medicare Other

## 2018-08-10 ENCOUNTER — Encounter (HOSPITAL_COMMUNITY): Payer: Self-pay

## 2018-08-10 VITALS — BP 102/51 | HR 65 | Temp 97.6°F | Resp 16

## 2018-08-10 DIAGNOSIS — K921 Melena: Secondary | ICD-10-CM | POA: Diagnosis not present

## 2018-08-10 DIAGNOSIS — D500 Iron deficiency anemia secondary to blood loss (chronic): Secondary | ICD-10-CM | POA: Diagnosis not present

## 2018-08-10 DIAGNOSIS — Z85820 Personal history of malignant melanoma of skin: Secondary | ICD-10-CM | POA: Diagnosis not present

## 2018-08-10 DIAGNOSIS — N184 Chronic kidney disease, stage 4 (severe): Secondary | ICD-10-CM | POA: Diagnosis not present

## 2018-08-10 DIAGNOSIS — Z79899 Other long term (current) drug therapy: Secondary | ICD-10-CM | POA: Diagnosis not present

## 2018-08-10 DIAGNOSIS — Z87891 Personal history of nicotine dependence: Secondary | ICD-10-CM | POA: Diagnosis not present

## 2018-08-10 DIAGNOSIS — D631 Anemia in chronic kidney disease: Secondary | ICD-10-CM | POA: Diagnosis not present

## 2018-08-10 DIAGNOSIS — Z86000 Personal history of in-situ neoplasm of breast: Secondary | ICD-10-CM | POA: Diagnosis not present

## 2018-08-10 DIAGNOSIS — Z923 Personal history of irradiation: Secondary | ICD-10-CM | POA: Diagnosis not present

## 2018-08-10 MED ORDER — SODIUM CHLORIDE 0.9 % IV SOLN
510.0000 mg | Freq: Once | INTRAVENOUS | Status: AC
  Administered 2018-08-10: 510 mg via INTRAVENOUS
  Filled 2018-08-10: qty 17

## 2018-08-10 MED ORDER — SODIUM CHLORIDE 0.9 % IV SOLN
Freq: Once | INTRAVENOUS | Status: AC
  Administered 2018-08-10: 14:00:00 via INTRAVENOUS

## 2018-08-10 MED ORDER — SODIUM CHLORIDE 0.9% FLUSH
10.0000 mL | Freq: Once | INTRAVENOUS | Status: AC | PRN
  Administered 2018-08-10: 10 mL

## 2018-08-10 NOTE — Progress Notes (Signed)
Pt presents today for Iron. VSS. No complaints of any changes since yesterday. MAR reviewed with daughter at the bedside. Updated.   Feraheme given today per MD orders. Tolerated infusion without adverse affects. Vital signs stable. No complaints at this time. Discharged from clinic via wheelchair . F/U with [facility] as scheduled.

## 2018-08-10 NOTE — Patient Instructions (Signed)
[facility] at [facility]  Discharge Instructions:   _______________________________________________________________  Thank you for choosing [facility] at [facility] to provide your oncology and hematology care.  To afford each patient quality time with our providers, please arrive at least 15 minutes before your scheduled appointment.  You need to re-schedule your appointment if you arrive 10 or more minutes late.  We strive to give you quality time with our providers, and arriving late affects you and other patients whose appointments are after yours.  Also, if you no show three or more times for appointments you may be dismissed from the clinic.  Again, thank you for choosing [facility] at [facility]. Our hope is that these requests will allow you access to exceptional care and in a timely manner. _______________________________________________________________  If you have questions after your visit, please contact our office at [phone] between the hours of 8:30 a.m. and 5:00 p.m. Voicemails left after 4:30 p.m. will not be returned until the following business day. _______________________________________________________________  For prescription refill requests, have your pharmacy contact our office. _______________________________________________________________  Recommendations made by the consultant and any test results will be sent to your referring physician. _______________________________________________________________ 

## 2018-08-11 ENCOUNTER — Other Ambulatory Visit (HOSPITAL_COMMUNITY)
Admission: RE | Admit: 2018-08-11 | Discharge: 2018-08-11 | Disposition: A | Discharge status: Home / Self Care | Payer: Medicare Other | Source: Ambulatory Visit | Attending: Cardiovascular Disease | Admitting: Cardiovascular Disease

## 2018-08-11 DIAGNOSIS — R635 Abnormal weight gain: Secondary | ICD-10-CM | POA: Diagnosis present

## 2018-08-11 DIAGNOSIS — Z79899 Other long term (current) drug therapy: Secondary | ICD-10-CM | POA: Diagnosis not present

## 2018-08-11 LAB — BASIC METABOLIC PANEL
Anion gap: 9 (ref 5–15)
BUN: 30 mg/dL — ABNORMAL HIGH (ref 8–23)
CO2: 30 mmol/L (ref 22–32)
Calcium: 8 mg/dL — ABNORMAL LOW (ref 8.9–10.3)
Chloride: 98 mmol/L (ref 98–111)
Creatinine, Ser: 1.64 mg/dL — ABNORMAL HIGH (ref 0.44–1.00)
GFR calc Af Amer: 31 mL/min — ABNORMAL LOW (ref >60)
GFR calc non Af Amer: 27 mL/min — ABNORMAL LOW (ref >60)
Glucose, Bld: 111 mg/dL — ABNORMAL HIGH (ref 70–99)
Potassium: 3.6 mmol/L (ref 3.5–5.1)
Sodium: 137 mmol/L (ref 135–145)

## 2018-08-12 ENCOUNTER — Telehealth: Payer: Self-pay

## 2018-08-12 NOTE — Telephone Encounter (Signed)
-----   Message from Herminio Commons, MD sent at 08/11/2018  1:30 PM EST ----- Renal dysfunction is stable and indicative of diuretic requirement.  Has swelling/weight come down?

## 2018-08-12 NOTE — Telephone Encounter (Signed)
Spoke with daughter, pt's swelling is better, pt can move toes where before she could not.Weight down by 4 lbs, yhey continue to monitor

## 2018-08-16 ENCOUNTER — Encounter: Payer: Self-pay | Admitting: Family Medicine

## 2018-08-16 MED ORDER — POTASSIUM CHLORIDE CRYS ER 20 MEQ PO TBCR: 20.0000 meq | EXTENDED_RELEASE_TABLET | Freq: Two times a day (BID) | ORAL | 3 refills | Status: AC

## 2018-08-17 ENCOUNTER — Ambulatory Visit (HOSPITAL_COMMUNITY): Payer: Medicaid Other

## 2018-08-18 ENCOUNTER — Encounter (HOSPITAL_COMMUNITY): Payer: Self-pay

## 2018-08-18 ENCOUNTER — Inpatient Hospital Stay (HOSPITAL_COMMUNITY): Payer: Medicare Other

## 2018-08-18 VITALS — BP 111/67 | HR 60 | Temp 98.0°F | Resp 18

## 2018-08-18 DIAGNOSIS — Z79899 Other long term (current) drug therapy: Secondary | ICD-10-CM | POA: Diagnosis not present

## 2018-08-18 DIAGNOSIS — Z923 Personal history of irradiation: Secondary | ICD-10-CM | POA: Diagnosis not present

## 2018-08-18 DIAGNOSIS — Z87891 Personal history of nicotine dependence: Secondary | ICD-10-CM | POA: Diagnosis not present

## 2018-08-18 DIAGNOSIS — D500 Iron deficiency anemia secondary to blood loss (chronic): Secondary | ICD-10-CM

## 2018-08-18 DIAGNOSIS — D631 Anemia in chronic kidney disease: Secondary | ICD-10-CM | POA: Diagnosis not present

## 2018-08-18 DIAGNOSIS — N184 Chronic kidney disease, stage 4 (severe): Secondary | ICD-10-CM | POA: Diagnosis not present

## 2018-08-18 DIAGNOSIS — K921 Melena: Secondary | ICD-10-CM | POA: Diagnosis not present

## 2018-08-18 DIAGNOSIS — Z85820 Personal history of malignant melanoma of skin: Secondary | ICD-10-CM | POA: Diagnosis not present

## 2018-08-18 DIAGNOSIS — Z86000 Personal history of in-situ neoplasm of breast: Secondary | ICD-10-CM | POA: Diagnosis not present

## 2018-08-18 MED ORDER — SODIUM CHLORIDE 0.9 % IV SOLN
Freq: Once | INTRAVENOUS | Status: AC
  Administered 2018-08-18: 14:00:00 via INTRAVENOUS

## 2018-08-18 MED ORDER — SODIUM CHLORIDE 0.9 % IV SOLN
510.0000 mg | Freq: Once | INTRAVENOUS | Status: AC
  Administered 2018-08-18: 510 mg via INTRAVENOUS
  Filled 2018-08-18: qty 17

## 2018-08-18 NOTE — Progress Notes (Signed)
Jonathon P Heidrick tolerated Feraheme infusion well without complaints or incident. VSS upon discharge. Pt discharged via wheelchair in satisfactory condition accompanied by her daughter

## 2018-08-18 NOTE — Patient Instructions (Signed)
[facility] at [facility] Discharge Instructions  Received Feraheme infusion today. Follow-up as scheduled. Call clinic for any questions or concerns   Thank you for choosing [facility] at [facility] to provide your oncology and hematology care.  To afford each patient quality time with our provider, please arrive at least 15 minutes before your scheduled appointment time.   If you have a lab appointment with the Cancer Center please come in thru the  Main Entrance and check in at the main information desk  You need to re-schedule your appointment should you arrive 10 or more minutes late.  We strive to give you quality time with our providers, and arriving late affects you and other patients whose appointments are after yours.  Also, if you no show three or more times for appointments you may be dismissed from the clinic at the providers discretion.     Again, thank you for choosing [facility].  Our hope is that these requests will decrease the amount of time that you wait before being seen by our physicians.       _____________________________________________________________  Should you have questions after your visit to [facility], please contact our office at [phone] between the hours of 8:00 a.m. and 4:30 p.m.  Voicemails left after 4:00 p.m. will not be returned until the following business day.  For prescription refill requests, have your pharmacy contact our office and allow 72 hours.    Cancer Center Support Programs:   > Cancer Support Group  2nd Tuesday of the month 1pm-2pm, Journey Room   

## 2018-08-19 ENCOUNTER — Telehealth: Payer: Self-pay | Admitting: Family Medicine

## 2018-08-19 NOTE — Telephone Encounter (Signed)
Joaquim Lai called and states that the pt, after doubling torsemeide x 1 dose, has only lost about 1/2 of a pound on wednesday and nothing today. Her legs and feet are really swollen and would like to know what she should do about it?  Per Dr. Dennard Schaumann - double her torsemide to 40mg  qd x 3 days. Call us back on Monday and let us know how she is doing.   Joaquim Lai aware and verbalized understanding.

## 2018-08-23 ENCOUNTER — Encounter: Payer: Self-pay | Admitting: Family Medicine

## 2018-08-24 ENCOUNTER — Encounter: Payer: Self-pay | Admitting: Family Medicine

## 2018-08-24 ENCOUNTER — Ambulatory Visit (INDEPENDENT_AMBULATORY_CARE_PROVIDER_SITE_OTHER): Payer: Medicare Other | Admitting: Family Medicine

## 2018-08-24 VITALS — BP 110/58 | HR 74 | Temp 97.5°F | Resp 18 | Ht 65.0 in | Wt 160.0 lb

## 2018-08-24 DIAGNOSIS — M7989 Other specified soft tissue disorders: Secondary | ICD-10-CM | POA: Diagnosis not present

## 2018-08-24 DIAGNOSIS — N184 Chronic kidney disease, stage 4 (severe): Secondary | ICD-10-CM

## 2018-08-24 DIAGNOSIS — I5022 Chronic systolic (congestive) heart failure: Secondary | ICD-10-CM

## 2018-08-24 LAB — CBC WITH DIFFERENTIAL/PLATELET
Absolute Monocytes: 400 cells/uL (ref 200–950)
BASOS ABS: 50 {cells}/uL (ref 0–200)
Basophils Relative: 1 %
Eosinophils Absolute: 70 cells/uL (ref 15–500)
Eosinophils Relative: 1.4 %
HCT: 31.9 % — ABNORMAL LOW (ref 35.0–45.0)
Hemoglobin: 9.9 g/dL — ABNORMAL LOW (ref 11.7–15.5)
Lymphs Abs: 1190 cells/uL (ref 850–3900)
MCH: 26 pg — ABNORMAL LOW (ref 27.0–33.0)
MCHC: 31 g/dL — ABNORMAL LOW (ref 32.0–36.0)
MCV: 83.7 fL (ref 80.0–100.0)
MPV: 9.9 fL (ref 7.5–12.5)
Monocytes Relative: 8 %
Neutro Abs: 3290 cells/uL (ref 1500–7800)
Neutrophils Relative %: 65.8 %
Platelets: 200 10*3/uL (ref 140–400)
RBC: 3.81 10*6/uL (ref 3.80–5.10)
RDW: 20.5 % — ABNORMAL HIGH (ref 11.0–15.0)
Total Lymphocyte: 23.8 %
WBC: 5 10*3/uL (ref 3.8–10.8)

## 2018-08-24 LAB — BASIC METABOLIC PANEL WITH GFR
BUN/Creatinine Ratio: 15 (calc) (ref 6–22)
BUN: 22 mg/dL (ref 7–25)
CO2: 28 mmol/L (ref 20–32)
Calcium: 8.4 mg/dL — ABNORMAL LOW (ref 8.6–10.4)
Chloride: 103 mmol/L (ref 98–110)
Creat: 1.51 mg/dL — ABNORMAL HIGH (ref 0.60–0.88)
GFR, Est African American: 34 mL/min/{1.73_m2} — ABNORMAL LOW (ref >=60)
GFR, Est Non African American: 29 mL/min/{1.73_m2} — ABNORMAL LOW (ref >=60)
Glucose, Bld: 92 mg/dL (ref 65–99)
Potassium: 4.1 mmol/L (ref 3.5–5.3)
Sodium: 141 mmol/L (ref 135–146)

## 2018-08-24 NOTE — Progress Notes (Signed)
Subjective:    Patient ID: [name] [name], female    DOB: 07-25-1923, 85 y.o.   MRN: [id number]  HPI  06/11/18 Patient is here today for follow-up.  She is currently on oxygen 2 L and satting 99% on room air.  She denies any chest pain.  She denies any shortness of breath at rest however with minimal activity she reports shortness of breath.  On physical exam today, she has faint bibasilar crackles and she also has +1 edema in both legs to the mid shin.  However unfortunately on her recent lab work her kidney function has deteriorated further.  At present she is taking 40 mg of Lasix in the morning and 20 mg of Lasix in the afternoon.  She is extremely weak.  She has severe pain in both hips and both knees that prevent her from walking.  She is pretty much confined to a wheelchair.  Physical therapy has been working with her however after her recent hospitalization she has been slow to regain her strength.  Her daughters are staying with her around-the-clock however they are both becoming extremely tired and facing caregiver burnout.  The patient requires 24/7 supervision mainly due to fall risk and poor ambulation.  At that time, my plan was: I had a long discussion today with the patient and her daughter.  I believe that her body is gradually slowly shutting down due to age.  I anticipate life expectancy to be less than 12 months and more likely to be less than 6 months.  She requires 24/7 coverage due to fall risk.  Her blood pressure is extremely low.  She is unable to tolerate carvedilol and has been off the medication now for several weeks since discharge from the nursing facility.  She appears to have additional fluid in her legs and in her pulmonary exam today however she is also showing evidence of prerenal azotemia.  Therefore since she is asymptomatic I would not increase her diuretic at the present time due to the risk of acute kidney injury and hospitalization unless she develops worsening  shortness of breath.  We discussed possible need for placement given the risk of caregiver burnout.  I had a long discussion with the family on how to do that.  The patient would benefit from a skilled nursing facility given her weakness.  At the present time there continue to try to care for her at home.  I will check a CBC and a CMP today.  If her situation worsens, we may need to consider hospice for end-of-life care.  08/24/18 Called last week with increased swelling,  Torsemide was doubled to 40 mg a day for 4 days and yesterday daughter called stating there had been no improvement in her swelling and therefore, I asked them to come in today for evaluation. Was 154 lbs on 06/11/18.  Wt Readings from Last 3 Encounters:  08/24/18 160 lb (72.6 kg)  08/09/18 168 lb 4.8 oz (76.3 kg)  07/23/18 162 lb (73.5 kg)   Patient has +2 pitting edema in both legs to the knee.  She has bibasilar crackles in both lungs.  Her weight is up 6 pounds from the last time I saw her in November.  She does report orthopnea when she lays down at night.  She is having to sleep in a chair.  This improves her breathing.  She denies any chest pain.  She denies any pressure.  She denies any cough or fevers or hemoptysis  Past Medical History:  Diagnosis Date  . Anemia in chronic kidney disease 06/02/2018  . Aortic insufficiency   . Ascending aortic aneurysm ([place])    a. 4.6cm by CT 2018 - pt deferred any further evaluation/invasive management therefore follow-up deferred.  . Bladder infection    h/o  . CHF (congestive heart failure) ([place])   . Chronic atrial fibrillation    a. not on anticoagulation due to prior GIB  . CKD (chronic kidney disease), stage III ([place])   . Coronary artery disease    a. mild nonobstructive by cath 2018.  Marland Kitchen DCIS (ductal carcinoma in situ) of breast 03/14/2013   right breast  . Dehydration    HISTORY   . Fall at home 09/10/12  . Hemorrhoids   . Hip fracture ([place])    hip surgery 2001  . History  of blood clots    in leg  . History of knee surgery   . Hypertension   . Iron deficiency anemia, unspecified 03/14/2013   secondary to GI blood loss  . Kidney infection    h/o  . Melanoma of skin ([place]) 03/14/2013  . Melanoma of thigh ([place])    left  . Mini stroke ([place])   . Mitral regurgitation   . Nonischemic cardiomyopathy ([place])    a. EF 35-40% by echo in 03/2017 with cath showing nonobstructive CAD, suspected stress-induced.  . S/P colonoscopy March 2010   RMR: friable anal canal hemorrhoids, hyperplastic ascending polyp, adenomatous descending polyp   . Stomach ulcer    secondary to h.pylori, s/p treatment  . Thyroid condition   . Tricuspid regurgitation   . Venous stasis    edema   Past Surgical History:  Procedure Laterality Date  . APPENDECTOMY  1942  . BREAST LUMPECTOMY  1998  . CARDIAC CATHETERIZATION    . CATARACT EXTRACTION, BILATERAL    . CHOLECYSTECTOMY    . COLONOSCOPY  06/05/11   pancolonic diverticulosis/ileal reosion/abnormal anorectal junction s/p biopsy: path for small intestine and Ti was benign with non-villous atrophy, rectal biopsy with prominent prolapse changes, no acute inflammation  . CORNEAL TRANSPLANT Bilateral 2013  . ENTEROSCOPY N/A 06/13/2013   UVO:ZDGU Gastritis/GI BLEED MOST LIKELY DUE TO DUODENAL ULCERS, AND ? AVMs  . ESOPHAGOGASTRODUODENOSCOPY  06/05/11   small hiatal hernia; + H.PYLORI GASTRITIS, s/p 5 days of Pylera, unable to finish due to N/V  . ESOPHAGOGASTRODUODENOSCOPY N/A 06/13/2013   Dr. Oneida Alar- see enteroscopy  . ESOPHAGOGASTRODUODENOSCOPY (EGD) WITH ESOPHAGEAL DILATION N/A 06/08/2013   YQI:[id number] hiatal hernia;  otherwise normal EGD s/p dilation  . GIVENS CAPSULE STUDY N/A 06/08/2013   Procedure: GIVENS CAPSULE STUDY;  Surgeon: Daneil Dolin, MD;  Location: [facility];  Service: Endoscopy;  Laterality: N/A;  . HEMORRHOID BANDING    . KNEE SURGERY Right 05/2006   total right  . LEFT HEART CATH AND CORONARY ANGIOGRAPHY N/A  03/18/2017   Procedure: LEFT HEART CATH AND CORONARY ANGIOGRAPHY;  Surgeon: Wellington Hampshire, MD;  Location: [facility];  Service: Cardiovascular;  Laterality: N/A;  . LEG SURGERY    . MELANOMA EXCISION Left 08/2006   left leg excision  . NM MYOCAR PERF WALL MOTION  03/27/2010   dipyridamole; small reversible basal to mid-septal defect (?artifact), post-stress EF 55%, low risk scan   . PARTIAL HYSTERECTOMY  1976  . TONSILLECTOMY    . TOTAL HIP ARTHROPLASTY Left 2001&2004    X 2 FOR LEFT HIP  . TRANSTHORACIC ECHOCARDIOGRAM  10/06/2012  EF 55-60%, mod eccentric hypertrophy, grade 2 diastolic dysfunction; mildly calcifed AV annulus with moderate regurg; aortic root mildly dilated; LA severely dailted; PA peak pressure 62mHg  . VARICOSE VEIN SURGERY     Current Outpatient Medications on File Prior to Visit  Medication Sig Dispense Refill  . cyanocobalamin 1000 MCG tablet Take 1,000 mcg by mouth daily.    Marland Kitchen docusate sodium (COLACE) 100 MG capsule Take 200 mg by mouth 2 (two) times daily.    . folic acid (FOLVITE) 1 MG tablet TAKE 1 TABLET BY MOUTH DAILY 30 tablet 5  . gabapentin (NEURONTIN) 100 MG capsule TAKE 2 CAPSULES BY MOUTH 3  TIMES DAILY 540 capsule 1  . hydrocortisone (ANUSOL-HC) 25 MG suppository Place 1 suppository (25 mg total) rectally every 12 (twelve) hours. 12 suppository 1  . metolazone (ZAROXOLYN) 2.5 MG tablet Take 2.5 mg today and 2.5 mg tomorrow (30 minutes before torsemide) 3 tablet 0  . ondansetron (ZOFRAN) 4 MG tablet TAKE 1 TABLET BY MOUTH  EVERY 8 HOURS AS NEEDED FOR NAUSEA AND VOMITING 60 tablet 3  . pantoprazole (PROTONIX) 40 MG tablet Take 1 tablet (40 mg total) by mouth 2 (two) times daily. 180 tablet 3  . potassium chloride SA (K-DUR,KLOR-CON) 20 MEQ tablet Take 1 tablet (20 mEq total) by mouth 2 (two) times daily. 180 tablet 3  . prednisoLONE acetate (PRED FORTE) 1 % ophthalmic suspension Place 1 drop into both eyes 4 (four) times daily.     . sodium chloride  (MURO 128) 2 % ophthalmic solution Place 1 drop into both eyes 3 (three) times daily.     Marland Kitchen torsemide (DEMADEX) 20 MG tablet Take 20 mg daily, May take extra 20 mg if weight gain in 24 hrs is over 3 lbs 180 tablet 3  . traMADol-acetaminophen (ULTRACET) 37.5-325 MG tablet TAKE 1 TABLET BY MOUTH EVERY 4 HOURS AS NEEDED FOR PAIN 180 tablet 0   No current facility-administered medications on file prior to visit.    Allergies  Allergen Reactions  . Codeine Diarrhea and Nausea Only  . Tape Other (See Comments)    SKIN IS VERY THIN AND TEARS AND BRUISES EASILY; PLEASE USE COBAN WRAP!!  . Diona Fanti [Aspirin] Rash and Other (See Comments)    Petechia   . Iron Nausea And Vomiting    Oral iron causes nausea and vomiting  . Penicillins Rash    Has patient had a PCN reaction causing immediate rash, facial/tongue/throat swelling, SOB or lightheadedness with hypotension: Yes Has patient had a PCN reaction causing severe rash involving mucus membranes or skin necrosis: Unknown Has patient had a PCN reaction that required hospitalization: No Has patient had a PCN reaction occurring within the last 10 years: No If all of the above answers are "NO", then may proceed with Cephalosporin use.    Social History   Socioeconomic History  . Marital status: Widowed    Spouse name: Not on file  . Number of children: 5  . Years of education: 6  . Highest education level: Not on file  Occupational History    Employer: RETIRED  Social Needs  . Financial resource strain: Not on file  . Food insecurity:    Worry: Not on file    Inability: Not on file  . Transportation needs:    Medical: Not on file    Non-medical: Not on file  Tobacco Use  . Smoking status: Former Smoker    Packs/day: 1.50    Years: 20.00  Pack years: 30.00    Types: Cigarettes    Last attempt to quit: 07/05/1975    Years since quitting: 43.1  . Smokeless tobacco: Never Used  . Tobacco comment: Quit smoking in 1975  Substance and  Sexual Activity  . Alcohol use: No  . Drug use: No  . Sexual activity: Never  Lifestyle  . Physical activity:    Days per week: Not on file    Minutes per session: Not on file  . Stress: Not on file  Relationships  . Social connections:    Talks on phone: Not on file    Gets together: Not on file    Attends religious service: Not on file    Active member of club or organization: Not on file    Attends meetings of clubs or organizations: Not on file    Relationship status: Not on file  . Intimate partner violence:    Fear of current or ex partner: Not on file    Emotionally abused: Not on file    Physically abused: Not on file    Forced sexual activity: Not on file  Other Topics Concern  . Not on file  Social History Narrative  . Not on file      Review of Systems  All other systems reviewed and are negative.      Objective:   Physical Exam Vitals signs reviewed.  Constitutional:      Appearance: She is well-developed.  Neck:     Vascular: No JVD.  Cardiovascular:     Rate and Rhythm: Normal rate. Rhythm irregularly irregular.     Heart sounds: Normal heart sounds.  Pulmonary:     Effort: Pulmonary effort is normal. No tachypnea or respiratory distress.     Breath sounds: Examination of the right-lower field reveals rales. Examination of the left-lower field reveals rales. Rales present. No wheezing.  Chest:     Chest wall: No tenderness.  Abdominal:     General: Bowel sounds are normal. There is no distension.     Palpations: Abdomen is soft. There is no mass.     Tenderness: There is no abdominal tenderness. There is no guarding or rebound.  Lymphadenopathy:     Cervical: No cervical adenopathy.          Assessment & Plan:  Chronic systolic congestive heart failure (HCC) - Plan: CBC with Differential/Platelet, BASIC METABOLIC PANEL WITH GFR, Brain natriuretic peptide  CKD (chronic kidney disease) stage 4, GFR 15-29 ml/min (HCC) - Plan: CBC with  Differential/Platelet, BASIC METABOLIC PANEL WITH GFR, Brain natriuretic peptide  Leg swelling - Plan: CBC with Differential/Platelet, BASIC METABOLIC PANEL WITH GFR, Brain natriuretic peptide  My concern is that the patient's chronic kidney disease has worsened and that she is not responding to torsemide.  Therefore I will check a BMP.  I will also check a BNP however the patient appears fluid overloaded on exam.  The other possibility is severe dependent edema in her legs.  I will increase torsemide to 80 mg a day and increase potassium from 20 mEq once a day to 20 mEq twice a day.  Recheck the patient on Thursday.  Meanwhile check baseline labs to assess for kidney failure

## 2018-08-25 LAB — BRAIN NATRIURETIC PEPTIDE: Brain Natriuretic Peptide: 378 pg/mL — ABNORMAL HIGH (ref <100)

## 2018-08-26 ENCOUNTER — Encounter: Payer: Self-pay | Admitting: Family Medicine

## 2018-08-26 ENCOUNTER — Ambulatory Visit (INDEPENDENT_AMBULATORY_CARE_PROVIDER_SITE_OTHER): Payer: Medicare Other | Admitting: Family Medicine

## 2018-08-26 VITALS — BP 110/64 | HR 76 | Temp 98.0°F | Resp 18 | Ht 65.0 in | Wt 165.0 lb

## 2018-08-26 DIAGNOSIS — M7989 Other specified soft tissue disorders: Secondary | ICD-10-CM

## 2018-08-26 LAB — BASIC METABOLIC PANEL WITH GFR
BUN/Creatinine Ratio: 16 (calc) (ref 6–22)
BUN: 29 mg/dL — ABNORMAL HIGH (ref 7–25)
CO2: 30 mmol/L (ref 20–32)
Calcium: 8.6 mg/dL (ref 8.6–10.4)
Chloride: 101 mmol/L (ref 98–110)
Creat: 1.79 mg/dL — ABNORMAL HIGH (ref 0.60–0.88)
GFR, EST AFRICAN AMERICAN: 28 mL/min/{1.73_m2} — AB (ref >=60)
GFR, Est Non African American: 24 mL/min/{1.73_m2} — ABNORMAL LOW (ref >=60)
Glucose, Bld: 84 mg/dL (ref 65–99)
Potassium: 4.4 mmol/L (ref 3.5–5.3)
Sodium: 142 mmol/L (ref 135–146)

## 2018-08-26 NOTE — Progress Notes (Signed)
Subjective:    Patient ID: [name] [name], female    DOB: Mar 29, 1923, 85 y.o.   MRN: [id number]  HPI  06/11/18 Patient is here today for follow-up.  She is currently on oxygen 2 L and satting  99% on room air.  She denies any chest pain.  She denies any shortness of breath at rest however with minimal activity she reports shortness of breath.  On physical exam today, she has faint bibasilar crackles and she also has +1 edema in both legs to the mid shin.  However unfortunately on her recent lab work her kidney function has deteriorated further.  At present she is taking 40 mg of Lasix in the morning and 20 mg of Lasix in the afternoon.  She is extremely weak.  She has severe pain in both hips and both knees that prevent her from walking.  She is pretty much confined to a wheelchair.  Physical therapy has been working with her however after her recent hospitalization she has been slow to regain her strength.  Her daughters are staying with her around-the-clock however they are both becoming extremely tired and facing caregiver burnout.  The patient requires 24/7 supervision mainly due to fall risk and poor ambulation.  At that time, my plan was: I had a long discussion today with the patient and her daughter.  I believe that her body is gradually slowly shutting down due to age.  I anticipate life expectancy to be less than 12 months and more likely to be less than 6 months.  She requires 24/7 coverage due to fall risk.  Her blood pressure is extremely low.  She is unable to tolerate carvedilol and has been off the medication now for several weeks since discharge from the nursing facility.  She appears to have additional fluid in her legs and in her pulmonary exam today however she is also showing evidence of prerenal azotemia.  Therefore since she is asymptomatic I would not increase her diuretic at the present time due to the risk of acute kidney injury and hospitalization unless she develops worsening  shortness of breath.  We discussed possible need for placement given the risk of caregiver burnout.  I had a long discussion with the family on how to do that.  The patient would benefit from a skilled nursing facility given her weakness.  At the present time there continue to try to care for her at home.  I will check a CBC and a CMP today.  If her situation worsens, we may need to consider hospice for end-of-life care.  08/24/18 Called last week with increased swelling,  Torsemide was doubled to 40 mg a day for 4 days and yesterday daughter called stating there had been no improvement in her swelling and therefore, I asked them to come in today for evaluation. Was 154 lbs on 06/11/18.  Wt Readings from Last 3 Encounters:  08/24/18 160 lb (72.6 kg)  08/09/18 168 lb 4.8 oz (76.3 kg)  07/23/18 162 lb (73.5 kg)   Patient has +2 pitting edema in both legs to the knee.  She has bibasilar crackles in both lungs.  Her weight is up 6 pounds from the last time I saw her in November.  She does report orthopnea when she lays down at night.  She is having to sleep in a chair.  This improves her breathing.  She denies any chest pain.  She denies any pressure.  She denies any cough or fevers or  hemoptysis.  At that time, my plan was: My concern is that the patient's chronic kidney disease has worsened and that she is not responding to torsemide.  Therefore I will check a BMP.  I will also check a BNP however the patient appears fluid overloaded on exam.  The other possibility is severe dependent edema in her legs.  I will increase torsemide to 80 mg a day and increase potassium from 20 mEq once a day to 20 mEq twice a day.  Recheck the patient on Thursday.  Meanwhile check baseline labs to assess for kidney failure  08/26/18 Office Visit on 08/24/2018  Component Date Value Ref Range Status  . WBC 08/24/2018 5.0  3.8 - 10.8 Thousand/uL Final  . RBC 08/24/2018 3.81  3.80 - 5.10 Million/uL Final  . Hemoglobin  08/24/2018 9.9* 11.7 - 15.5 g/dL Final  . HCT 08/24/2018 31.9* 35.0 - 45.0 % Final  . MCV 08/24/2018 83.7  80.0 - 100.0 fL Final  . MCH 08/24/2018 26.0* 27.0 - 33.0 pg Final  . MCHC 08/24/2018 31.0* 32.0 - 36.0 g/dL Final  . RDW 08/24/2018 20.5* 11.0 - 15.0 % Final  . Platelets 08/24/2018 200  140 - 400 Thousand/uL Final  . MPV 08/24/2018 9.9  7.5 - 12.5 fL Final  . Neutro Abs 08/24/2018 3,290  1,500 - 7,800 cells/uL Final  . Lymphs Abs 08/24/2018 1,190  850 - 3,900 cells/uL Final  . Absolute Monocytes 08/24/2018 400  200 - 950 cells/uL Final  . Eosinophils Absolute 08/24/2018 70  15 - 500 cells/uL Final  . Basophils Absolute 08/24/2018 50  0 - 200 cells/uL Final  . Neutrophils Relative % 08/24/2018 65.8  % Final  . Total Lymphocyte 08/24/2018 23.8  % Final  . Monocytes Relative 08/24/2018 8.0  % Final  . Eosinophils Relative 08/24/2018 1.4  % Final  . Basophils Relative 08/24/2018 1.0  % Final  . Glucose, Bld 08/24/2018 92  65 - 99 mg/dL Final   Comment: .            Fasting reference interval .   . BUN 08/24/2018 22  7 - 25 mg/dL Final  . Creat 08/24/2018 1.51* 0.60 - 0.88 mg/dL Final   Comment: For patients >45 years of age, the reference limit for Creatinine is approximately 13% higher for people identified as African-American. .   . GFR, Est Non African American 08/24/2018 29* > OR = 60 mL/min/1.71m2 Final  . GFR, Est African American 08/24/2018 34* > OR = 60 mL/min/1.75m2 Final  . BUN/Creatinine Ratio 08/24/2018 15  6 - 22 (calc) Final  . Sodium 08/24/2018 141  135 - 146 mmol/L Final  . Potassium 08/24/2018 4.1  3.5 - 5.3 mmol/L Final  . Chloride 08/24/2018 103  98 - 110 mmol/L Final  . CO2 08/24/2018 28  20 - 32 mmol/L Final  . Calcium 08/24/2018 8.4* 8.6 - 10.4 mg/dL Final  . Brain Natriuretic Peptide 08/24/2018 378* <100 pg/mL Final   Comment: . BNP levels increase with age in the general population with the highest values seen in individuals greater than 57 years  of age. Reference: J. Am. [place] Ar. Cardiol. 2002; 82:505-397. Marland Kitchen     Patient's renal function was stable.  BNP was slightly elevated.  She is here today for recheck.  By our scales today, the patient has gained weight.  However on her scales at home, her weight has fallen from 1 66-1 62.  Her lungs are much clearer today.  There are  no crackles in either base.  She continues to have +1 to +2 pitting edema in both feet and lower shins.  The swelling stops mid shin.  I still believe some of this could be dependent edema coupled with chronic venous insufficiency however the patient is unable to tolerate wearing compression hose at home. Past Medical History:  Diagnosis Date  . Anemia in chronic kidney disease 06/02/2018  . Aortic insufficiency   . Ascending aortic aneurysm ([place])    a. 4.6cm by CT 2018 - pt deferred any further evaluation/invasive management therefore follow-up deferred.  . Bladder infection    h/o  . CHF (congestive heart failure) ([place])   . Chronic atrial fibrillation    a. not on anticoagulation due to prior GIB  . CKD (chronic kidney disease), stage III ([place])   . Coronary artery disease    a. mild nonobstructive by cath 2018.  Marland Kitchen DCIS (ductal carcinoma in situ) of breast 03/14/2013   right breast  . Dehydration    HISTORY   . Fall at home 09/10/12  . Hemorrhoids   . Hip fracture ([place])    hip surgery 2001  . History of blood clots    in leg  . History of knee surgery   . Hypertension   . Iron deficiency anemia, unspecified 03/14/2013   secondary to GI blood loss  . Kidney infection    h/o  . Melanoma of skin ([place]) 03/14/2013  . Melanoma of thigh ([place])    left  . Mini stroke ([place])   . Mitral regurgitation   . Nonischemic cardiomyopathy ([place])    a. EF 35-40% by echo in 03/2017 with cath showing nonobstructive CAD, suspected stress-induced.  . S/P colonoscopy March 2010   RMR: friable anal canal hemorrhoids, hyperplastic ascending polyp, adenomatous descending polyp     . Stomach ulcer    secondary to h.pylori, s/p treatment  . Thyroid condition   . Tricuspid regurgitation   . Venous stasis    edema   Past Surgical History:  Procedure Laterality Date  . APPENDECTOMY  1942  . BREAST LUMPECTOMY  1998  . CARDIAC CATHETERIZATION    . CATARACT EXTRACTION, BILATERAL    . CHOLECYSTECTOMY    . COLONOSCOPY  06/05/11   pancolonic diverticulosis/ileal reosion/abnormal anorectal junction s/p biopsy: path for small intestine and Ti was benign with non-villous atrophy, rectal biopsy with prominent prolapse changes, no acute inflammation  . CORNEAL TRANSPLANT Bilateral 2013  . ENTEROSCOPY N/A 06/13/2013   JOA:CZYS Gastritis/GI BLEED MOST LIKELY DUE TO DUODENAL ULCERS, AND ? AVMs  . ESOPHAGOGASTRODUODENOSCOPY  06/05/11   small hiatal hernia; + H.PYLORI GASTRITIS, s/p 5 days of Pylera, unable to finish due to N/V  . ESOPHAGOGASTRODUODENOSCOPY N/A 06/13/2013   Dr. Oneida Alar- see enteroscopy  . ESOPHAGOGASTRODUODENOSCOPY (EGD) WITH ESOPHAGEAL DILATION N/A 06/08/2013   AYT:KZSWF hiatal hernia;  otherwise normal EGD s/p dilation  . GIVENS CAPSULE STUDY N/A 06/08/2013   Procedure: GIVENS CAPSULE STUDY;  Surgeon: Daneil Dolin, MD;  Location: [facility];  Service: Endoscopy;  Laterality: N/A;  . HEMORRHOID BANDING    . KNEE SURGERY Right 05/2006   total right  . LEFT HEART CATH AND CORONARY ANGIOGRAPHY N/A 03/18/2017   Procedure: LEFT HEART CATH AND CORONARY ANGIOGRAPHY;  Surgeon: Wellington Hampshire, MD;  Location: [facility];  Service: Cardiovascular;  Laterality: N/A;  . LEG SURGERY    . MELANOMA EXCISION Left 08/2006   left leg excision  . NM MYOCAR PERF  WALL MOTION  03/27/2010   dipyridamole; small reversible basal to mid-septal defect (?artifact), post-stress EF 55%, low risk scan   . PARTIAL HYSTERECTOMY  1976  . TONSILLECTOMY    . TOTAL HIP ARTHROPLASTY Left 2001&2004    X 2 FOR LEFT HIP  . TRANSTHORACIC ECHOCARDIOGRAM  10/06/2012   EF 55-60%, mod  eccentric hypertrophy, grade 2 diastolic dysfunction; mildly calcifed AV annulus with moderate regurg; aortic root mildly dilated; LA severely dailted; PA peak pressure 35mHg  . VARICOSE VEIN SURGERY     Current Outpatient Medications on File Prior to Visit  Medication Sig Dispense Refill  . cyanocobalamin 1000 MCG tablet Take 1,000 mcg by mouth daily.    Marland Kitchen docusate sodium (COLACE) 100 MG capsule Take 200 mg by mouth 2 (two) times daily.    . folic acid (FOLVITE) 1 MG tablet TAKE 1 TABLET BY MOUTH DAILY 30 tablet 5  . gabapentin (NEURONTIN) 100 MG capsule TAKE 2 CAPSULES BY MOUTH 3  TIMES DAILY 540 capsule 1  . hydrocortisone (ANUSOL-HC) 25 MG suppository Place 1 suppository (25 mg total) rectally every 12 (twelve) hours. 12 suppository 1  . metolazone (ZAROXOLYN) 2.5 MG tablet Take 2.5 mg today and 2.5 mg tomorrow (30 minutes before torsemide) 3 tablet 0  . ondansetron (ZOFRAN) 4 MG tablet TAKE 1 TABLET BY MOUTH  EVERY 8 HOURS AS NEEDED FOR NAUSEA AND VOMITING 60 tablet 3  . pantoprazole (PROTONIX) 40 MG tablet Take 1 tablet (40 mg total) by mouth 2 (two) times daily. 180 tablet 3  . potassium chloride SA (K-DUR,KLOR-CON) 20 MEQ tablet Take 1 tablet (20 mEq total) by mouth 2 (two) times daily. 180 tablet 3  . prednisoLONE acetate (PRED FORTE) 1 % ophthalmic suspension Place 1 drop into both eyes 4 (four) times daily.     . sodium chloride (MURO 128) 2 % ophthalmic solution Place 1 drop into both eyes 3 (three) times daily.     Marland Kitchen torsemide (DEMADEX) 20 MG tablet Take 20 mg daily, May take extra 20 mg if weight gain in 24 hrs is over 3 lbs (Patient taking differently: Take 40 mg by mouth once. Take 20 mg daily, May take extra 20 mg if weight gain in 24 hrs is over 3 lbs) 180 tablet 3  . traMADol-acetaminophen (ULTRACET) 37.5-325 MG tablet TAKE 1 TABLET BY MOUTH EVERY 4 HOURS AS NEEDED FOR PAIN 180 tablet 0   No current facility-administered medications on file prior to visit.    Allergies    Allergen Reactions  . Codeine Diarrhea and Nausea Only  . Tape Other (See Comments)    SKIN IS VERY THIN AND TEARS AND BRUISES EASILY; PLEASE USE COBAN WRAP!!  . Diona Fanti [Aspirin] Rash and Other (See Comments)    Petechia   . Iron Nausea And Vomiting    Oral iron causes nausea and vomiting  . Penicillins Rash    Has patient had a PCN reaction causing immediate rash, facial/tongue/throat swelling, SOB or lightheadedness with hypotension: Yes Has patient had a PCN reaction causing severe rash involving mucus membranes or skin necrosis: Unknown Has patient had a PCN reaction that required hospitalization: No Has patient had a PCN reaction occurring within the last 10 years: No If all of the above answers are "NO", then may proceed with Cephalosporin use.    Social History   Socioeconomic History  . Marital status: Widowed    Spouse name: Not on file  . Number of children: 5  . Years of  education: 6  . Highest education level: Not on file  Occupational History    Employer: RETIRED  Social Needs  . Financial resource strain: Not on file  . Food insecurity:    Worry: Not on file    Inability: Not on file  . Transportation needs:    Medical: Not on file    Non-medical: Not on file  Tobacco Use  . Smoking status: Former Smoker    Packs/day: 1.50    Years: 20.00    Pack years: 30.00    Types: Cigarettes    Last attempt to quit: 07/05/1975    Years since quitting: 43.1  . Smokeless tobacco: Never Used  . Tobacco comment: Quit smoking in 1975  Substance and Sexual Activity  . Alcohol use: No  . Drug use: No  . Sexual activity: Never  Lifestyle  . Physical activity:    Days per week: Not on file    Minutes per session: Not on file  . Stress: Not on file  Relationships  . Social connections:    Talks on phone: Not on file    Gets together: Not on file    Attends religious service: Not on file    Active member of club or organization: Not on file    Attends meetings of  clubs or organizations: Not on file    Relationship status: Not on file  . Intimate partner violence:    Fear of current or ex partner: Not on file    Emotionally abused: Not on file    Physically abused: Not on file    Forced sexual activity: Not on file  Other Topics Concern  . Not on file  Social History Narrative  . Not on file      Review of Systems  All other systems reviewed and are negative.      Objective:   Physical Exam Vitals signs reviewed.  Constitutional:      Appearance: She is well-developed.  Neck:     Vascular: No JVD.  Cardiovascular:     Rate and Rhythm: Normal rate. Rhythm irregularly irregular.     Heart sounds: Normal heart sounds.  Pulmonary:     Effort: Pulmonary effort is normal. No tachypnea or respiratory distress.     Breath sounds: No wheezing or rales.  Chest:     Chest wall: No tenderness.  Abdominal:     General: Bowel sounds are normal. There is no distension.     Palpations: Abdomen is soft. There is no mass.     Tenderness: There is no abdominal tenderness. There is no guarding or rebound.  Lymphadenopathy:     Cervical: No cervical adenopathy.          Assessment & Plan:  Leg swelling - Plan: BASIC METABOLIC PANEL WITH GFR  Patient is doing better both by her weight on her home scales and by her clinical exam although she still has significant edema in both legs.  I feel the majority of this edema is more likely secondary to chronic venous insufficiency and dependent edema due to the fact the patient is sedentary in a chair all day long.  At this point I would recommend bilateral Unna boots however the patient is wearing pants today that would prevent [place] from putting them on.  Therefore we will try the higher dose of torsemide through the weekend.  I will monitor her potassium and renal function today with a BMP and then recheck the patient on Monday.  If the swelling is no better by Monday, we will decrease her dose of diuretic  to avoid dehydration and place the patient in bilateral Unna boots for chronic venous insufficiency and dependent edema.

## 2018-08-28 ENCOUNTER — Other Ambulatory Visit: Payer: Self-pay | Admitting: Family Medicine

## 2018-08-30 ENCOUNTER — Encounter: Payer: Self-pay | Admitting: Family Medicine

## 2018-08-30 ENCOUNTER — Ambulatory Visit (INDEPENDENT_AMBULATORY_CARE_PROVIDER_SITE_OTHER): Payer: Medicare Other | Admitting: Family Medicine

## 2018-08-30 VITALS — BP 108/60 | HR 70 | Temp 97.8°F | Resp 12

## 2018-08-30 DIAGNOSIS — M7989 Other specified soft tissue disorders: Secondary | ICD-10-CM | POA: Diagnosis not present

## 2018-08-30 NOTE — Telephone Encounter (Signed)
Ok to refill??  Last office visit 08/26/2018.  Last refill 07/29/2018.

## 2018-08-30 NOTE — Progress Notes (Signed)
Subjective:    Patient ID: [name] [name], female    DOB: [birth year], 86 y.o.   MRN: [id number]  HPI  06/11/18 Patient is here today for follow-up.  She is currently on oxygen 2 L and satting  99% on room air.  She denies any chest pain.  She denies any shortness of breath at rest however with minimal activity she reports shortness of breath.  On physical exam today, she has faint bibasilar crackles and she also has +1 edema in both legs to the mid shin.  However unfortunately on her recent lab work her kidney function has deteriorated further.  At present she is taking 40 mg of Lasix in the morning and 20 mg of Lasix in the afternoon.  She is extremely weak.  She has severe pain in both hips and both knees that prevent her from walking.  She is pretty much confined to a wheelchair.  Physical therapy has been working with her however after her recent hospitalization she has been slow to regain her strength.  Her daughters are staying with her around-the-clock however they are both becoming extremely tired and facing caregiver burnout.  The patient requires 24/7 supervision mainly due to fall risk and poor ambulation.  At that time, my plan was: I had a long discussion today with the patient and her daughter.  I believe that her body is gradually slowly shutting down due to age.  I anticipate life expectancy to be less than 12 months and more likely to be less than 6 months.  She requires 24/7 coverage due to fall risk.  Her blood pressure is extremely low.  She is unable to tolerate carvedilol and has been off the medication now for several weeks since discharge from the nursing facility.  She appears to have additional fluid in her legs and in her pulmonary exam today however she is also showing evidence of prerenal azotemia.  Therefore since she is asymptomatic I would not increase her diuretic at the present time due to the risk of acute kidney injury and hospitalization unless she develops worsening  shortness of breath.  We discussed possible need for placement given the risk of caregiver burnout.  I had a long discussion with the family on how to do that.  The patient would benefit from a skilled nursing facility given her weakness.  At the present time there continue to try to care for her at home.  I will check a CBC and a CMP today.  If her situation worsens, we may need to consider hospice for end-of-life care.  08/24/18 Called last week with increased swelling,  Torsemide was doubled to 40 mg a day for 4 days and yesterday daughter called stating there had been no improvement in her swelling and therefore, I asked them to come in today for evaluation. Was 154 lbs on 06/11/18.  Wt Readings from Last 3 Encounters:  08/26/18 165 lb (74.8 kg)  08/24/18 160 lb (72.6 kg)  08/09/18 168 lb 4.8 oz (76.3 kg)   Patient has +2 pitting edema in both legs to the knee.  She has bibasilar crackles in both lungs.  Her weight is up 6 pounds from the last time I saw her in November.  She does report orthopnea when she lays down at night.  She is having to sleep in a chair.  This improves her breathing.  She denies any chest pain.  She denies any pressure.  She denies any cough or fevers or  hemoptysis.  At that time, my plan was: My concern is that the patient's chronic kidney disease has worsened and that she is not responding to torsemide.  Therefore I will check a BMP.  I will also check a BNP however the patient appears fluid overloaded on exam.  The other possibility is severe dependent edema in her legs.  I will increase torsemide to 80 mg a day and increase potassium from 20 mEq once a day to 20 mEq twice a day.  Recheck the patient on Thursday.  Meanwhile check baseline labs to assess for kidney failure  08/26/18 Office Visit on 08/26/2018  Component Date Value Ref Range Status  . Glucose, Bld 08/26/2018 84  65 - 99 mg/dL Final   Comment: .            Fasting reference interval .   . BUN 08/26/2018  29* 7 - 25 mg/dL Final  . Creat 08/26/2018 1.79* 0.60 - 0.88 mg/dL Final   Comment: For patients >81 years of age, the reference limit for Creatinine is approximately 13% higher for people identified as African-American. .   . GFR, Est Non African American 08/26/2018 24* > OR = 60 mL/min/1.33m2 Final  . GFR, Est African American 08/26/2018 28* > OR = 60 mL/min/1.24m2 Final  . BUN/Creatinine Ratio 08/26/2018 16  6 - 22 (calc) Final  . Sodium 08/26/2018 142  135 - 146 mmol/L Final  . Potassium 08/26/2018 4.4  3.5 - 5.3 mmol/L Final  . Chloride 08/26/2018 101  98 - 110 mmol/L Final  . CO2 08/26/2018 30  20 - 32 mmol/L Final  . Calcium 08/26/2018 8.6  8.6 - 10.4 mg/dL Final  Office Visit on 08/24/2018  Component Date Value Ref Range Status  . WBC 08/24/2018 5.0  3.8 - 10.8 Thousand/uL Final  . RBC 08/24/2018 3.81  3.80 - 5.10 Million/uL Final  . Hemoglobin 08/24/2018 9.9* 11.7 - 15.5 g/dL Final  . HCT 08/24/2018 31.9* 35.0 - 45.0 % Final  . MCV 08/24/2018 83.7  80.0 - 100.0 fL Final  . MCH 08/24/2018 26.0* 27.0 - 33.0 pg Final  . MCHC 08/24/2018 31.0* 32.0 - 36.0 g/dL Final  . RDW 08/24/2018 20.5* 11.0 - 15.0 % Final  . Platelets 08/24/2018 200  140 - 400 Thousand/uL Final  . MPV 08/24/2018 9.9  7.5 - 12.5 fL Final  . Neutro Abs 08/24/2018 3,290  1,500 - 7,800 cells/uL Final  . Lymphs Abs 08/24/2018 1,190  850 - 3,900 cells/uL Final  . Absolute Monocytes 08/24/2018 400  200 - 950 cells/uL Final  . Eosinophils Absolute 08/24/2018 70  15 - 500 cells/uL Final  . Basophils Absolute 08/24/2018 50  0 - 200 cells/uL Final  . Neutrophils Relative % 08/24/2018 65.8  % Final  . Total Lymphocyte 08/24/2018 23.8  % Final  . Monocytes Relative 08/24/2018 8.0  % Final  . Eosinophils Relative 08/24/2018 1.4  % Final  . Basophils Relative 08/24/2018 1.0  % Final  . Glucose, Bld 08/24/2018 92  65 - 99 mg/dL Final   Comment: .            Fasting reference interval .   . BUN 08/24/2018 22  7 - 25  mg/dL Final  . Creat 08/24/2018 1.51* 0.60 - 0.88 mg/dL Final   Comment: For patients >11 years of age, the reference limit for Creatinine is approximately 13% higher for people identified as African-American. .   . GFR, Est Non African American 08/24/2018 29* >  OR = 60 mL/min/1.83m2 Final  . GFR, Est African American 08/24/2018 34* > OR = 60 mL/min/1.57m2 Final  . BUN/Creatinine Ratio 08/24/2018 15  6 - 22 (calc) Final  . Sodium 08/24/2018 141  135 - 146 mmol/L Final  . Potassium 08/24/2018 4.1  3.5 - 5.3 mmol/L Final  . Chloride 08/24/2018 103  98 - 110 mmol/L Final  . CO2 08/24/2018 28  20 - 32 mmol/L Final  . Calcium 08/24/2018 8.4* 8.6 - 10.4 mg/dL Final  . Brain Natriuretic Peptide 08/24/2018 378* <100 pg/mL Final   Comment: . BNP levels increase with age in the general population with the highest values seen in individuals greater than 20 years of age. Reference: J. Am. [place] Ar. Cardiol. 2002; 38:101-751. Marland Kitchen     Patient's renal function was stable.  BNP was slightly elevated.  She is here today for recheck.  By our scales today, the patient has gained weight.  However on her scales at home, her weight has fallen from 1 66-1 62.  Her lungs are much clearer today.  There are no crackles in either base.  She continues to have +1 to +2 pitting edema in both feet and lower shins.  The swelling stops mid shin.  I still believe some of this could be dependent edema coupled with chronic venous insufficiency however the patient is unable to tolerate wearing compression hose at home.  At that time, my plan was: Patient is doing better both by her weight on her home scales and by her clinical exam although she still has significant edema in both legs.  I feel the majority of this edema is more likely secondary to chronic venous insufficiency and dependent edema due to the fact the patient is sedentary in a chair all day long.  At this point I would recommend bilateral Unna boots however the patient  is wearing pants today that would prevent [place] from putting them on.  Therefore we will try the higher dose of torsemide through the weekend.  I will monitor her potassium and renal function today with a BMP and then recheck the patient on Monday.  If the swelling is no better by Monday, we will decrease her dose of diuretic to avoid dehydration and place the patient in bilateral Unna boots for chronic venous insufficiency and dependent edema.  08/30/18 Patient is here today with her daughter for follow-up.  Continues to have pitting edema in both legs although is better than it has been in the past.  It is +1 from the mid shin to the foot bilaterally.  There is significant tenderness to palpation in both legs distal to the knee due to the swelling and edema.  She is here today for [employer].  I explained to the patient that she would wear the Unna boots for 3 or 4 days to get the swelling under control and that we would then need to switch to compression hose which she would have to wear every day indefinitely.  Both the patient and the daughter state that she will not wear the compression hose after we discontinue the Unna boots because they are so uncomfortable for her.  Therefore we had a long discussion today about whether or not to do the [employer].  She does have significant edema in her legs however it does not seem to be bothering her.  There are no venous stasis ulcers.  There is no erythema or evidence of cellulitis.  Therefore I question the benefit of  putting Unna boots on this patient to treat chronic venous insufficiency if the patient will not continue compression hose at home. Past Medical History:  Diagnosis Date  . Anemia in chronic kidney disease 06/02/2018  . Aortic insufficiency   . Ascending aortic aneurysm ([place])    a. 4.6cm by CT 2018 - pt deferred any further evaluation/invasive management therefore follow-up deferred.  . Bladder infection    h/o  . CHF (congestive heart failure)  ([place])   . Chronic atrial fibrillation    a. not on anticoagulation due to prior GIB  . CKD (chronic kidney disease), stage III ([place])   . Coronary artery disease    a. mild nonobstructive by cath 2018.  Marland Kitchen DCIS (ductal carcinoma in situ) of breast 03/14/2013   right breast  . Dehydration    HISTORY   . Fall at home 09/10/12  . Hemorrhoids   . Hip fracture ([place])    hip surgery 2001  . History of blood clots    in leg  . History of knee surgery   . Hypertension   . Iron deficiency anemia, unspecified 03/14/2013   secondary to GI blood loss  . Kidney infection    h/o  . Melanoma of skin ([place]) 03/14/2013  . Melanoma of thigh ([place])    left  . Mini stroke ([place])   . Mitral regurgitation   . Nonischemic cardiomyopathy ([place])    a. EF 35-40% by echo in 03/2017 with cath showing nonobstructive CAD, suspected stress-induced.  . S/P colonoscopy March 2010   RMR: friable anal canal hemorrhoids, hyperplastic ascending polyp, adenomatous descending polyp   . Stomach ulcer    secondary to h.pylori, s/p treatment  . Thyroid condition   . Tricuspid regurgitation   . Venous stasis    edema   Past Surgical History:  Procedure Laterality Date  . APPENDECTOMY  1942  . BREAST LUMPECTOMY  1998  . CARDIAC CATHETERIZATION    . CATARACT EXTRACTION, BILATERAL    . CHOLECYSTECTOMY    . COLONOSCOPY  06/05/11   pancolonic diverticulosis/ileal reosion/abnormal anorectal junction s/p biopsy: path for small intestine and Ti was benign with non-villous atrophy, rectal biopsy with prominent prolapse changes, no acute inflammation  . CORNEAL TRANSPLANT Bilateral 2013  . ENTEROSCOPY N/A 06/13/2013   BHA:LPFX Gastritis/GI BLEED MOST LIKELY DUE TO DUODENAL ULCERS, AND ? AVMs  . ESOPHAGOGASTRODUODENOSCOPY  06/05/11   small hiatal hernia; + H.PYLORI GASTRITIS, s/p 5 days of Pylera, unable to finish due to N/V  . ESOPHAGOGASTRODUODENOSCOPY N/A 06/13/2013   Dr. Oneida Alar- see enteroscopy  . ESOPHAGOGASTRODUODENOSCOPY  (EGD) WITH ESOPHAGEAL DILATION N/A 06/08/2013   TKW:IOXBD hiatal hernia;  otherwise normal EGD s/p dilation  . GIVENS CAPSULE STUDY N/A 06/08/2013   Procedure: GIVENS CAPSULE STUDY;  Surgeon: Daneil Dolin, MD;  Location: [facility];  Service: Endoscopy;  Laterality: N/A;  . HEMORRHOID BANDING    . KNEE SURGERY Right 05/2006   total right  . LEFT HEART CATH AND CORONARY ANGIOGRAPHY N/A 03/18/2017   Procedure: LEFT HEART CATH AND CORONARY ANGIOGRAPHY;  Surgeon: Wellington Hampshire, MD;  Location: [facility];  Service: Cardiovascular;  Laterality: N/A;  . LEG SURGERY    . MELANOMA EXCISION Left 08/2006   left leg excision  . NM MYOCAR PERF WALL MOTION  03/27/2010   dipyridamole; small reversible basal to mid-septal defect (?artifact), post-stress EF 55%, low risk scan   . PARTIAL HYSTERECTOMY  1976  . TONSILLECTOMY    .  TOTAL HIP ARTHROPLASTY Left 2001&2004    X 2 FOR LEFT HIP  . TRANSTHORACIC ECHOCARDIOGRAM  10/06/2012   EF 55-60%, mod eccentric hypertrophy, grade 2 diastolic dysfunction; mildly calcifed AV annulus with moderate regurg; aortic root mildly dilated; LA severely dailted; PA peak pressure 28mHg  . VARICOSE VEIN SURGERY     Current Outpatient Medications on File Prior to Visit  Medication Sig Dispense Refill  . cyanocobalamin 1000 MCG tablet Take 1,000 mcg by mouth daily.    Marland Kitchen docusate sodium (COLACE) 100 MG capsule Take 200 mg by mouth 2 (two) times daily.    . folic acid (FOLVITE) 1 MG tablet TAKE 1 TABLET BY MOUTH DAILY 30 tablet 5  . gabapentin (NEURONTIN) 100 MG capsule TAKE 2 CAPSULES BY MOUTH 3  TIMES DAILY 540 capsule 1  . hydrocortisone (ANUSOL-HC) 25 MG suppository Place 1 suppository (25 mg total) rectally every 12 (twelve) hours. 12 suppository 1  . metolazone (ZAROXOLYN) 2.5 MG tablet Take 2.5 mg today and 2.5 mg tomorrow (30 minutes before torsemide) 3 tablet 0  . ondansetron (ZOFRAN) 4 MG tablet TAKE 1 TABLET BY MOUTH  EVERY 8 HOURS AS NEEDED FOR NAUSEA AND  VOMITING 60 tablet 3  . pantoprazole (PROTONIX) 40 MG tablet Take 1 tablet (40 mg total) by mouth 2 (two) times daily. 180 tablet 3  . potassium chloride SA (K-DUR,KLOR-CON) 20 MEQ tablet Take 1 tablet (20 mEq total) by mouth 2 (two) times daily. 180 tablet 3  . prednisoLONE acetate (PRED FORTE) 1 % ophthalmic suspension Place 1 drop into both eyes 4 (four) times daily.     . sodium chloride (MURO 128) 2 % ophthalmic solution Place 1 drop into both eyes 3 (three) times daily.     Marland Kitchen torsemide (DEMADEX) 20 MG tablet Take 20 mg daily, May take extra 20 mg if weight gain in 24 hrs is over 3 lbs (Patient taking differently: Take 40 mg by mouth once. Take 20 mg daily, May take extra 20 mg if weight gain in 24 hrs is over 3 lbs) 180 tablet 3  . traMADol-acetaminophen (ULTRACET) 37.5-325 MG tablet TAKE 1 TABLET BY MOUTH EVERY 4 HOURS AS NEEDED FOR PAIN 180 tablet 0   No current facility-administered medications on file prior to visit.    Allergies  Allergen Reactions  . Codeine Diarrhea and Nausea Only  . Tape Other (See Comments)    SKIN IS VERY THIN AND TEARS AND BRUISES EASILY; PLEASE USE COBAN WRAP!!  . Diona Fanti [Aspirin] Rash and Other (See Comments)    Petechia   . Iron Nausea And Vomiting    Oral iron causes nausea and vomiting  . Penicillins Rash    Has patient had a PCN reaction causing immediate rash, facial/tongue/throat swelling, SOB or lightheadedness with hypotension: Yes Has patient had a PCN reaction causing severe rash involving mucus membranes or skin necrosis: Unknown Has patient had a PCN reaction that required hospitalization: No Has patient had a PCN reaction occurring within the last 10 years: No If all of the above answers are "NO", then may proceed with Cephalosporin use.    Social History   Socioeconomic History  . Marital status: Widowed    Spouse name: Not on file  . Number of children: 5  . Years of education: 6  . Highest education level: Not on file  Occupational  History    Employer: RETIRED  Social Needs  . Financial resource strain: Not on file  . Food insecurity:  Worry: Not on file    Inability: Not on file  . Transportation needs:    Medical: Not on file    Non-medical: Not on file  Tobacco Use  . Smoking status: Former Smoker    Packs/day: 1.50    Years: 20.00    Pack years: 30.00    Types: Cigarettes    Last attempt to quit: 07/05/1975    Years since quitting: 43.1  . Smokeless tobacco: Never Used  . Tobacco comment: Quit smoking in 1975  Substance and Sexual Activity  . Alcohol use: No  . Drug use: No  . Sexual activity: Never  Lifestyle  . Physical activity:    Days per week: Not on file    Minutes per session: Not on file  . Stress: Not on file  Relationships  . Social connections:    Talks on phone: Not on file    Gets together: Not on file    Attends religious service: Not on file    Active member of club or organization: Not on file    Attends meetings of clubs or organizations: Not on file    Relationship status: Not on file  . Intimate partner violence:    Fear of current or ex partner: Not on file    Emotionally abused: Not on file    Physically abused: Not on file    Forced sexual activity: Not on file  Other Topics Concern  . Not on file  Social History Narrative  . Not on file      Review of Systems  All other systems reviewed and are negative.      Objective:   Physical Exam Vitals signs reviewed.  Constitutional:      Appearance: She is well-developed.  Neck:     Vascular: No JVD.  Cardiovascular:     Rate and Rhythm: Normal rate. Rhythm irregularly irregular.     Heart sounds: Normal heart sounds.  Pulmonary:     Effort: Pulmonary effort is normal. No tachypnea or respiratory distress.     Breath sounds: No wheezing or rales.  Chest:     Chest wall: No tenderness.  Abdominal:     General: Bowel sounds are normal. There is no distension.     Palpations: Abdomen is soft. There is no  mass.     Tenderness: There is no abdominal tenderness. There is no guarding or rebound.  Lymphadenopathy:     Cervical: No cervical adenopathy.          Assessment & Plan:   After long discussion, both I the patient and the daughter agree not to apply the Unna boots.  The patient has dependent edema secondary to sitting all day long, chronic venous insufficiency, and congestive heart failure.  Her renal function will not allow increased diuresis.  She does not appear to be fluid overloaded on exam today however she does appear to be third spacing secondary to venous insufficiency.  Therefore her only treatment option would be to wear compression hose after using Unna boots to get the edema under control.  The patient will not wear the compression hose due to comfort.  Therefore I see no point in applying the Unna boots today simply to remove them in 3 to 4 days and have the patient discontinue therapy at that point.  The daughter agrees.  Therefore we will not do any treatment today unless the edema worsens to the point she develops venous stasis ulcers at which point we  will have to use Unna boots

## 2018-09-08 ENCOUNTER — Other Ambulatory Visit: Payer: Self-pay | Admitting: Orthopedic Surgery

## 2018-09-08 DIAGNOSIS — M5416 Radiculopathy, lumbar region: Secondary | ICD-10-CM

## 2018-09-08 DIAGNOSIS — M48061 Spinal stenosis, lumbar region without neurogenic claudication: Secondary | ICD-10-CM

## 2018-09-08 DIAGNOSIS — M25552 Pain in left hip: Secondary | ICD-10-CM

## 2018-09-15 ENCOUNTER — Inpatient Hospital Stay (HOSPITAL_BASED_OUTPATIENT_CLINIC_OR_DEPARTMENT_OTHER): Payer: Medicare Other | Admitting: Hematology

## 2018-09-15 ENCOUNTER — Other Ambulatory Visit: Payer: Self-pay

## 2018-09-15 ENCOUNTER — Inpatient Hospital Stay (HOSPITAL_COMMUNITY): Payer: Medicare Other | Attending: Hematology

## 2018-09-15 ENCOUNTER — Encounter (HOSPITAL_COMMUNITY): Payer: Self-pay | Admitting: Hematology

## 2018-09-15 VITALS — BP 109/43 | HR 76 | Temp 98.2°F | Resp 18 | Wt 164.2 lb

## 2018-09-15 DIAGNOSIS — I130 Hypertensive heart and chronic kidney disease with heart failure and stage 1 through stage 4 chronic kidney disease, or unspecified chronic kidney disease: Secondary | ICD-10-CM | POA: Diagnosis not present

## 2018-09-15 DIAGNOSIS — I482 Chronic atrial fibrillation, unspecified: Secondary | ICD-10-CM | POA: Insufficient documentation

## 2018-09-15 DIAGNOSIS — R51 Headache: Secondary | ICD-10-CM | POA: Insufficient documentation

## 2018-09-15 DIAGNOSIS — Z86000 Personal history of in-situ neoplasm of breast: Secondary | ICD-10-CM | POA: Insufficient documentation

## 2018-09-15 DIAGNOSIS — D500 Iron deficiency anemia secondary to blood loss (chronic): Secondary | ICD-10-CM | POA: Diagnosis not present

## 2018-09-15 DIAGNOSIS — D509 Iron deficiency anemia, unspecified: Secondary | ICD-10-CM | POA: Insufficient documentation

## 2018-09-15 DIAGNOSIS — Z85820 Personal history of malignant melanoma of skin: Secondary | ICD-10-CM

## 2018-09-15 DIAGNOSIS — Z79899 Other long term (current) drug therapy: Secondary | ICD-10-CM | POA: Insufficient documentation

## 2018-09-15 DIAGNOSIS — Z87891 Personal history of nicotine dependence: Secondary | ICD-10-CM

## 2018-09-15 DIAGNOSIS — D631 Anemia in chronic kidney disease: Secondary | ICD-10-CM

## 2018-09-15 DIAGNOSIS — E538 Deficiency of other specified B group vitamins: Secondary | ICD-10-CM | POA: Diagnosis not present

## 2018-09-15 DIAGNOSIS — R262 Difficulty in walking, not elsewhere classified: Secondary | ICD-10-CM

## 2018-09-15 DIAGNOSIS — Z86718 Personal history of other venous thrombosis and embolism: Secondary | ICD-10-CM

## 2018-09-15 DIAGNOSIS — C439 Malignant melanoma of skin, unspecified: Secondary | ICD-10-CM

## 2018-09-15 DIAGNOSIS — N183 Chronic kidney disease, stage 3 (moderate): Secondary | ICD-10-CM

## 2018-09-15 LAB — COMPREHENSIVE METABOLIC PANEL
ALT: 10 U/L (ref 0–44)
AST: 17 U/L (ref 15–41)
Albumin: 3.7 g/dL (ref 3.5–5.0)
Alkaline Phosphatase: 66 U/L (ref 38–126)
Anion gap: 10 (ref 5–15)
BUN: 41 mg/dL — ABNORMAL HIGH (ref 8–23)
CALCIUM: 8.2 mg/dL — AB (ref 8.9–10.3)
CO2: 28 mmol/L (ref 22–32)
Chloride: 100 mmol/L (ref 98–111)
Creatinine, Ser: 1.64 mg/dL — ABNORMAL HIGH (ref 0.44–1.00)
GFR calc Af Amer: 31 mL/min — ABNORMAL LOW (ref >60)
GFR calc non Af Amer: 26 mL/min — ABNORMAL LOW (ref >60)
Glucose, Bld: 105 mg/dL — ABNORMAL HIGH (ref 70–99)
Potassium: 3.9 mmol/L (ref 3.5–5.1)
Sodium: 138 mmol/L (ref 135–145)
Total Bilirubin: 0.7 mg/dL (ref 0.3–1.2)
Total Protein: 7 g/dL (ref 6.5–8.1)

## 2018-09-15 LAB — CBC WITH DIFFERENTIAL/PLATELET
Abs Immature Granulocytes: 0.01 10*3/uL (ref 0.00–0.07)
Basophils Absolute: 0 10*3/uL (ref 0.0–0.1)
Basophils Relative: 1 %
EOS PCT: 4 %
Eosinophils Absolute: 0.2 10*3/uL (ref 0.0–0.5)
HCT: 37.4 % (ref 36.0–46.0)
Hemoglobin: 10.8 g/dL — ABNORMAL LOW (ref 12.0–15.0)
Immature Granulocytes: 0 %
Lymphocytes Relative: 27 %
Lymphs Abs: 1.3 10*3/uL (ref 0.7–4.0)
MCH: 26.2 pg (ref 26.0–34.0)
MCHC: 28.9 g/dL — ABNORMAL LOW (ref 30.0–36.0)
MCV: 90.6 fL (ref 80.0–100.0)
Monocytes Absolute: 0.4 10*3/uL (ref 0.1–1.0)
Monocytes Relative: 9 %
Neutro Abs: 2.9 10*3/uL (ref 1.7–7.7)
Neutrophils Relative %: 59 %
Platelets: 163 10*3/uL (ref 150–400)
RBC: 4.13 MIL/uL (ref 3.87–5.11)
RDW: 23.1 % — ABNORMAL HIGH (ref 11.5–15.5)
WBC: 4.8 10*3/uL (ref 4.0–10.5)
nRBC: 0 % (ref 0.0–0.2)

## 2018-09-15 LAB — FERRITIN: Ferritin: 74 ng/mL (ref 11–307)

## 2018-09-15 LAB — LACTATE DEHYDROGENASE: LDH: 138 U/L (ref 98–192)

## 2018-09-15 NOTE — Progress Notes (Signed)
[place], [place] [address]   CLINIC:  Medical Oncology/Hematology  PCP:  [name], MD [address] [place] [address] [phone]   REASON FOR VISIT: Follow-up for Stage I (T1a) melanoma of left thigh AND Iron deficiency anemia secondary to GI blood loss  CURRENT THERAPY: observation AND intermittent iron infusions   CANCER STAGING: Cancer Staging Melanoma of skin ([place]) Staging form: Melanoma of the Skin, AJCC 7th Edition - Clinical: Stage IA (T1a, N0, M0) - Signed by Baird Cancer, PA-C on 03/14/2013    INTERVAL HISTORY:  Ms. Schlatter 85 y.o. female returns for routine follow-up for iron deficiency anemia. She reports the iron infusions helped with her energy. She has lower leg edema and she is unable to walk with out her walker. She has occasional headaches which are improved with the iron infusions. Denies any nausea, vomiting, or diarrhea. Denies any new pains. Had not noticed any recent bleeding such as epistaxis, hematuria or hematochezia. Denies recent chest pain on exertion, shortness of breath on minimal exertion, pre-syncopal episodes, or palpitations. Denies any numbness or tingling in hands or feet. Denies any recent fevers, infections, or recent hospitalizations. Patient reports appetite at 100% and energy level at 75%.   REVIEW OF SYSTEMS:  Review of Systems  Constitutional: Positive for fatigue.  Respiratory: Positive for shortness of breath.   Cardiovascular: Positive for leg swelling.  Gastrointestinal: Positive for constipation.  Neurological: Positive for headaches.  All other systems reviewed and are negative.    PAST MEDICAL/SURGICAL HISTORY:  Past Medical History:  Diagnosis Date  . Anemia in chronic kidney disease 06/02/2018  . Aortic insufficiency   . Ascending aortic aneurysm ([place])    a. 4.6cm by CT 2018 - pt deferred any further evaluation/invasive management therefore follow-up  deferred.  . Bladder infection    h/o  . CHF (congestive heart failure) ([place])   . Chronic atrial fibrillation    a. not on anticoagulation due to prior GIB  . CKD (chronic kidney disease), stage III ([place])   . Coronary artery disease    a. mild nonobstructive by cath 2018.  Marland Kitchen DCIS (ductal carcinoma in situ) of breast 03/14/2013   right breast  . Dehydration    HISTORY   . Fall at home 09/10/12  . Hemorrhoids   . Hip fracture ([place])    hip surgery 2001  . History of blood clots    in leg  . History of knee surgery   . Hypertension   . Iron deficiency anemia, unspecified 03/14/2013   secondary to GI blood loss  . Kidney infection    h/o  . Melanoma of skin ([place]) 03/14/2013  . Melanoma of thigh ([place])    left  . Mini stroke ([place])   . Mitral regurgitation   . Nonischemic cardiomyopathy ([place])    a. EF 35-40% by echo in 03/2017 with cath showing nonobstructive CAD, suspected stress-induced.  . S/P colonoscopy March 2010   RMR: friable anal canal hemorrhoids, hyperplastic ascending polyp, adenomatous descending polyp   . Stomach ulcer    secondary to h.pylori, s/p treatment  . Thyroid condition   . Tricuspid regurgitation   . Venous stasis    edema   Past Surgical History:  Procedure Laterality Date  . APPENDECTOMY  1942  . BREAST LUMPECTOMY  1998  . CARDIAC CATHETERIZATION    . CATARACT EXTRACTION, BILATERAL    . CHOLECYSTECTOMY    .  COLONOSCOPY  06/05/11   pancolonic diverticulosis/ileal reosion/abnormal anorectal junction s/p biopsy: path for small intestine and Ti was benign with non-villous atrophy, rectal biopsy with prominent prolapse changes, no acute inflammation  . CORNEAL TRANSPLANT Bilateral 2013  . ENTEROSCOPY N/A 06/13/2013   WUJ:WJXB Gastritis/GI BLEED MOST LIKELY DUE TO DUODENAL ULCERS, AND ? AVMs  . ESOPHAGOGASTRODUODENOSCOPY  06/05/11   small hiatal hernia; + H.PYLORI GASTRITIS, s/p 5 days of Pylera, unable to finish due to N/V  . ESOPHAGOGASTRODUODENOSCOPY N/A  06/13/2013   Dr. Oneida Alar- see enteroscopy  . ESOPHAGOGASTRODUODENOSCOPY (EGD) WITH ESOPHAGEAL DILATION N/A 06/08/2013   JYN:WGNFA hiatal hernia;  otherwise normal EGD s/p dilation  . GIVENS CAPSULE STUDY N/A 06/08/2013   Procedure: GIVENS CAPSULE STUDY;  Surgeon: Daneil Dolin, MD;  Location: [facility];  Service: Endoscopy;  Laterality: N/A;  . HEMORRHOID BANDING    . KNEE SURGERY Right 05/2006   total right  . LEFT HEART CATH AND CORONARY ANGIOGRAPHY N/A 03/18/2017   Procedure: LEFT HEART CATH AND CORONARY ANGIOGRAPHY;  Surgeon: Wellington Hampshire, MD;  Location: [facility];  Service: Cardiovascular;  Laterality: N/A;  . LEG SURGERY    . MELANOMA EXCISION Left 08/2006   left leg excision  . NM MYOCAR PERF WALL MOTION  03/27/2010   dipyridamole; small reversible basal to mid-septal defect (?artifact), post-stress EF 55%, low risk scan   . PARTIAL HYSTERECTOMY  1976  . TONSILLECTOMY    . TOTAL HIP ARTHROPLASTY Left 2001&2004    X 2 FOR LEFT HIP  . TRANSTHORACIC ECHOCARDIOGRAM  10/06/2012   EF 55-60%, mod eccentric hypertrophy, grade 2 diastolic dysfunction; mildly calcifed AV annulus with moderate regurg; aortic root mildly dilated; LA severely dailted; PA peak pressure 81mHg  . VARICOSE VEIN SURGERY       SOCIAL HISTORY:  Social History   Socioeconomic History  . Marital status: Widowed    Spouse name: Not on file  . Number of children: 5  . Years of education: 6  . Highest education level: Not on file  Occupational History    Employer: RETIRED  Social Needs  . Financial resource strain: Not on file  . Food insecurity:    Worry: Not on file    Inability: Not on file  . Transportation needs:    Medical: Not on file    Non-medical: Not on file  Tobacco Use  . Smoking status: Former Smoker    Packs/day: 1.50    Years: 20.00    Pack years: 30.00    Types: Cigarettes    Last attempt to quit: 07/05/1975    Years since quitting: 43.2  . Smokeless tobacco: Never Used    . Tobacco comment: Quit smoking in 1975  Substance and Sexual Activity  . Alcohol use: No  . Drug use: No  . Sexual activity: Never  Lifestyle  . Physical activity:    Days per week: Not on file    Minutes per session: Not on file  . Stress: Not on file  Relationships  . Social connections:    Talks on phone: Not on file    Gets together: Not on file    Attends religious service: Not on file    Active member of club or organization: Not on file    Attends meetings of clubs or organizations: Not on file    Relationship status: Not on file  . Intimate partner violence:    Fear of current or ex partner: Not on file  Emotionally abused: Not on file    Physically abused: Not on file    Forced sexual activity: Not on file  Other Topics Concern  . Not on file  Social History Narrative  . Not on file    FAMILY HISTORY:  Family History  Problem Relation Age of Onset  . Breast cancer Daughter        also hyperlipidemia  . Breast cancer Daughter        also hyperlipidemia  . Asthma Mother   . Multiple sclerosis Child   . Hyperlipidemia Child   . Colon cancer Neg Hx     CURRENT MEDICATIONS:  Outpatient Encounter Medications as of 09/15/2018  Medication Sig  . cyanocobalamin 1000 MCG tablet Take 1,000 mcg by mouth daily.  Marland Kitchen docusate sodium (COLACE) 100 MG capsule Take 200 mg by mouth 2 (two) times daily.  . folic acid (FOLVITE) 1 MG tablet TAKE 1 TABLET BY MOUTH DAILY  . gabapentin (NEURONTIN) 100 MG capsule TAKE 2 CAPSULES BY MOUTH 3  TIMES DAILY  . hydrocortisone (ANUSOL-HC) 25 MG suppository Place 1 suppository (25 mg total) rectally every 12 (twelve) hours.  . metolazone (ZAROXOLYN) 2.5 MG tablet Take 2.5 mg today and 2.5 mg tomorrow (30 minutes before torsemide)  . ondansetron (ZOFRAN) 4 MG tablet TAKE 1 TABLET BY MOUTH  EVERY 8 HOURS AS NEEDED FOR NAUSEA AND VOMITING  . pantoprazole (PROTONIX) 40 MG tablet Take 1 tablet (40 mg total) by mouth 2 (two) times daily.  .  potassium chloride SA (K-DUR,KLOR-CON) 20 MEQ tablet Take 1 tablet (20 mEq total) by mouth 2 (two) times daily.  . prednisoLONE acetate (PRED FORTE) 1 % ophthalmic suspension Place 1 drop into both eyes 4 (four) times daily.   . sodium chloride (MURO 128) 2 % ophthalmic solution Place 1 drop into both eyes 3 (three) times daily.   Marland Kitchen torsemide (DEMADEX) 20 MG tablet Take 20 mg daily, May take extra 20 mg if weight gain in 24 hrs is over 3 lbs (Patient taking differently: Take 40 mg by mouth once. Take 20 mg daily, May take extra 20 mg if weight gain in 24 hrs is over 3 lbs)  . traMADol-acetaminophen (ULTRACET) 37.5-325 MG tablet TAKE 1 TABLET BY MOUTH EVERY 4 HOURS AS NEEDED FOR PAIN   No facility-administered encounter medications on file as of 09/15/2018.     ALLERGIES:  Allergies  Allergen Reactions  . Codeine Diarrhea and Nausea Only  . Tape Other (See Comments)    SKIN IS VERY THIN AND TEARS AND BRUISES EASILY; PLEASE USE COBAN WRAP!!  . Diona Fanti [Aspirin] Rash and Other (See Comments)    Petechia   . Iron Nausea And Vomiting    Oral iron causes nausea and vomiting  . Penicillins Rash    Has patient had a PCN reaction causing immediate rash, facial/tongue/throat swelling, SOB or lightheadedness with hypotension: Yes Has patient had a PCN reaction causing severe rash involving mucus membranes or skin necrosis: Unknown Has patient had a PCN reaction that required hospitalization: No Has patient had a PCN reaction occurring within the last 10 years: No If all of the above answers are "NO", then may proceed with Cephalosporin use.      PHYSICAL EXAM:  ECOG Performance status: 1  Vitals:   09/15/18 1450  BP: (!) 109/43  Pulse: 76  Resp: 18  Temp: 98.2 F (36.8 C)  SpO2: 93%   Filed Weights   09/15/18 1450  Weight: 164 lb 3.2  oz (74.5 kg)    Physical Exam Constitutional:      Appearance: Normal appearance. She is normal weight.  Musculoskeletal: Normal range of motion.    Skin:    General: Skin is warm and dry.  Neurological:     Mental Status: She is alert and oriented to person, place, and time. Mental status is at baseline.  Psychiatric:        Mood and Affect: Mood normal.        Behavior: Behavior normal.        Thought Content: Thought content normal.        Judgment: Judgment normal.      LABORATORY DATA:  I have reviewed the labs as listed.  CBC    Component Value Date/Time   WBC 4.8 09/15/2018 1352   RBC 4.13 09/15/2018 1352   HGB 10.8 (L) 09/15/2018 1352   HGB 11.2 08/29/2015 1057   HCT 37.4 09/15/2018 1352   HCT 34.3 08/29/2015 1057   PLT 163 09/15/2018 1352   MCV 90.6 09/15/2018 1352   MCH 26.2 09/15/2018 1352   MCHC 28.9 (L) 09/15/2018 1352   RDW 23.1 (H) 09/15/2018 1352   LYMPHSABS 1.3 09/15/2018 1352   MONOABS 0.4 09/15/2018 1352   EOSABS 0.2 09/15/2018 1352   BASOSABS 0.0 09/15/2018 1352   CMP Latest Ref Rng & Units 09/15/2018 08/26/2018 08/24/2018  Glucose 70 - 99 mg/dL 105(H) 84 92  BUN 8 - 23 mg/dL 41(H) 29(H) 22  Creatinine 0.44 - 1.00 mg/dL 1.64(H) 1.79(H) 1.51(H)  Sodium 135 - 145 mmol/L 138 142 141  Potassium 3.5 - 5.1 mmol/L 3.9 4.4 4.1  Chloride 98 - 111 mmol/L 100 101 103  CO2 22 - 32 mmol/L 28 30 28   Calcium 8.9 - 10.3 mg/dL 8.2(L) 8.6 8.4(L)  Total Protein 6.5 - 8.1 g/dL 7.0 - -  Total Bilirubin 0.3 - 1.2 mg/dL 0.7 - -  Alkaline Phos 38 - 126 U/L 66 - -  AST 15 - 41 U/L 17 - -  ALT 0 - 44 U/L 10 - -       DIAGNOSTIC IMAGING:  I have independently reviewed the scans and discussed with the patient.   I have reviewed Francene Finders, NP's note and agree with the documentation.  I personally performed a face-to-face visit, made revisions and my assessment and plan is as follows.    ASSESSMENT & PLAN:   IDA (iron deficiency anemia) 1.  Normocytic anemia: -Anemia from a combination of chronic kidney disease and iron deficiency state.  Potentially chronic GI blood loss. -Received 1 dose of Epogen on  06/04/2018, 40,000 units. -She received Feraheme on 08/10/2018 and 08/18/2018. -She has seen improvement in her energy levels since then. - We have reviewed her blood work.  Hemoglobin improved to 10.8.  Ferritin is 74. -We will review her blood work in 2 to 3 months and likely give her Feraheme infusions.  2.  B12 deficiency: -She will continue B12 supplements at home.  3.  Cancer history: -She had DCIS of the right breast in 1998 and melanoma of the left thigh resected in 2007.      Orders placed this encounter:  Orders Placed This Encounter  Procedures  . CBC with Differential/Platelet  . Comprehensive metabolic panel  . Ferritin  . Iron and TIBC  . Vitamin B12  . Folate  . VITAMIN D 25 Hydroxy (Vit-D Deficiency, Fractures)      Derek Jack, MD [place] [phone]

## 2018-09-15 NOTE — Assessment & Plan Note (Signed)
1.  Normocytic anemia: -Anemia from a combination of chronic kidney disease and iron deficiency state.  Potentially chronic GI blood loss. -Received 1 dose of Epogen on 06/04/2018, 40,000 units. -She received Feraheme on 08/10/2018 and 08/18/2018. -She has seen improvement in her energy levels since then. - We have reviewed her blood work.  Hemoglobin improved to 10.8.  Ferritin is 74. -We will review her blood work in 2 to 3 months and likely give her Feraheme infusions.  2.  B12 deficiency: -She will continue B12 supplements at home.  3.  Cancer history: -She had DCIS of the right breast in 1998 and melanoma of the left thigh resected in 2007.

## 2018-09-15 NOTE — Patient Instructions (Signed)
[facility] Discharge Instructions     Thank you for choosing [facility] at [facility] to provide your oncology and hematology care.  To afford each patient quality time with our provider, please arrive at least 15 minutes before your scheduled appointment time.   If you have a lab appointment with the Cancer Center please come in thru the  Main Entrance and check in at the main information desk  You need to re-schedule your appointment should you arrive 10 or more minutes late.  We strive to give you quality time with our providers, and arriving late affects you and other patients whose appointments are after yours.  Also, if you no show three or more times for appointments you may be dismissed from the clinic at the providers discretion.     Again, thank you for choosing [facility].  Our hope is that these requests will decrease the amount of time that you wait before being seen by our physicians.       _____________________________________________________________  Should you have questions after your visit to [facility], please contact our office at [phone] between the hours of 8:00 a.m. and 4:30 p.m.  Voicemails left after 4:00 p.m. will not be returned until the following business day.  For prescription refill requests, have your pharmacy contact our office and allow 72 hours.    Cancer Center Support Programs:   > Cancer Support Group  2nd Tuesday of the month 1pm-2pm, Journey Room    

## 2018-09-20 ENCOUNTER — Ambulatory Visit (INDEPENDENT_AMBULATORY_CARE_PROVIDER_SITE_OTHER): Payer: Medicare Other | Admitting: Family Medicine

## 2018-09-20 ENCOUNTER — Encounter: Payer: Self-pay | Admitting: Family Medicine

## 2018-09-20 VITALS — BP 98/56 | HR 96 | Temp 98.0°F | Resp 16 | Ht 65.0 in

## 2018-09-20 DIAGNOSIS — R300 Dysuria: Secondary | ICD-10-CM | POA: Diagnosis not present

## 2018-09-20 DIAGNOSIS — H6123 Impacted cerumen, bilateral: Secondary | ICD-10-CM | POA: Diagnosis not present

## 2018-09-20 DIAGNOSIS — M25551 Pain in right hip: Secondary | ICD-10-CM

## 2018-09-20 LAB — URINALYSIS, ROUTINE W REFLEX MICROSCOPIC
BILIRUBIN URINE: NEGATIVE
Bacteria, UA: NONE SEEN /HPF
Glucose, UA: NEGATIVE
Ketones, ur: NEGATIVE
Nitrite: NEGATIVE
Protein, ur: NEGATIVE
Specific Gravity, Urine: 1.01 (ref 1.001–1.03)
pH: 5.5 (ref 5.0–8.0)

## 2018-09-20 LAB — MICROSCOPIC MESSAGE

## 2018-09-20 MED ORDER — HYDROCODONE-ACETAMINOPHEN 5-325 MG PO TABS: 1.0000 | ORAL_TABLET | Freq: Four times a day (QID) | ORAL | 0 refills | Status: DC | PRN

## 2018-09-20 MED ORDER — SULFAMETHOXAZOLE-TRIMETHOPRIM 800-160 MG PO TABS: 1.0000 | ORAL_TABLET | Freq: Two times a day (BID) | ORAL | 0 refills | Status: DC

## 2018-09-20 NOTE — Progress Notes (Signed)
Subjective:    Patient ID: [name] [name], female    DOB: [birth year], 85 y.o.   MRN: [id number]  HPI Patient presents today with 3 concerns.  First, she recently went for an evaluation for hearing aids.  She was found to have bilateral cerumen impaction.  Today on exam, her right auditory canal is completely occluded with cerumen impaction.  There is approximately an 80% blockage in her left auditory canal.  She is asking that I remove this with irrigation and lavage.  Second issue is dysuria.  Over the last week, she reports increasing urinary frequency.  She reports urgency.  She reports dysuria.  Urinalysis today shows trace blood, and +1 leukocyte Estrace with negative nitrites.  Given the symptoms, this is concerning for possible mild UTI.  She is allergic to penicillins and I want to try to avoid Cipro given her history of chronic kidney disease.  Third issue is continued chronic pain in her right hip.  The pain is primarily located in the lateral hip over the greater trochanteric bursa.  She also has pain in the posterior right hip near the sciatic notch.  X-ray of the right hip in 2015 did show degenerative joint disease in the right hip.  Pain is made worse by palpation and standing and range of motion.  She is currently taking tramadol 3 times a day and Tylenol in between and this is no longer relieving the pain.  Due to her advanced age, we have not repeated imaging as she is not deemed to be a surgical candidate.  She has previously had cortisone injections in the right hip that helped some.  She is unable to take NSAIDs due to her chronic kidney disease  Past Medical History:  Diagnosis Date  . Anemia in chronic kidney disease 06/02/2018  . Aortic insufficiency   . Ascending aortic aneurysm ([place])    a. 4.6cm by CT 2018 - pt deferred any further evaluation/invasive management therefore follow-up deferred.  . Bladder infection    h/o  . CHF (congestive heart failure) ([place])   . Chronic  atrial fibrillation    a. not on anticoagulation due to prior GIB  . CKD (chronic kidney disease), stage III ([place])   . Coronary artery disease    a. mild nonobstructive by cath 2018.  Marland Kitchen DCIS (ductal carcinoma in situ) of breast 03/14/2013   right breast  . Dehydration    HISTORY   . Fall at home 09/10/12  . Hemorrhoids   . Hip fracture ([place])    hip surgery 2001  . History of blood clots    in leg  . History of knee surgery   . Hypertension   . Iron deficiency anemia, unspecified 03/14/2013   secondary to GI blood loss  . Kidney infection    h/o  . Melanoma of skin ([place]) 03/14/2013  . Melanoma of thigh ([place])    left  . Mini stroke ([place])   . Mitral regurgitation   . Nonischemic cardiomyopathy ([place])    a. EF 35-40% by echo in 03/2017 with cath showing nonobstructive CAD, suspected stress-induced.  . S/P colonoscopy March 2010   RMR: friable anal canal hemorrhoids, hyperplastic ascending polyp, adenomatous descending polyp   . Stomach ulcer    secondary to h.pylori, s/p treatment  . Thyroid condition   . Tricuspid regurgitation   . Venous stasis    edema   Past Surgical History:  Procedure Laterality Date  . APPENDECTOMY  1942  .  BREAST LUMPECTOMY  1998  . CARDIAC CATHETERIZATION    . CATARACT EXTRACTION, BILATERAL    . CHOLECYSTECTOMY    . COLONOSCOPY  06/05/11   pancolonic diverticulosis/ileal reosion/abnormal anorectal junction s/p biopsy: path for small intestine and Ti was benign with non-villous atrophy, rectal biopsy with prominent prolapse changes, no acute inflammation  . CORNEAL TRANSPLANT Bilateral 2013  . ENTEROSCOPY N/A 06/13/2013   GUY:[id number] Gastritis/GI BLEED MOST LIKELY DUE TO DUODENAL ULCERS, AND ? AVMs  . ESOPHAGOGASTRODUODENOSCOPY  06/05/11   small hiatal hernia; + H.PYLORI GASTRITIS, s/p 5 days of Pylera, unable to finish due to N/V  . ESOPHAGOGASTRODUODENOSCOPY N/A 06/13/2013   Dr. Oneida Alar- see enteroscopy  . ESOPHAGOGASTRODUODENOSCOPY (EGD) WITH  ESOPHAGEAL DILATION N/A 06/08/2013   [id number] hiatal hernia;  otherwise normal EGD s/p dilation  . GIVENS CAPSULE STUDY N/A 06/08/2013   Procedure: GIVENS CAPSULE STUDY;  Surgeon: Daneil Dolin, MD;  [facility];  Service: Endoscopy;  Laterality: N/A;  . HEMORRHOID BANDING    . KNEE SURGERY Right 05/2006   total right  . LEFT HEART CATH AND CORONARY ANGIOGRAPHY N/A 03/18/2017   Procedure: LEFT HEART CATH AND CORONARY ANGIOGRAPHY;  Surgeon: Wellington Hampshire, MD;  Location: [facility];  Service: Cardiovascular;  Laterality: N/A;  . LEG SURGERY    . MELANOMA EXCISION Left 08/2006   left leg excision  . NM MYOCAR PERF WALL MOTION  03/27/2010   dipyridamole; small reversible basal to mid-septal defect (?artifact), post-stress EF 55%, low risk scan   . PARTIAL HYSTERECTOMY  1976  . TONSILLECTOMY    . TOTAL HIP ARTHROPLASTY Left 2001&2004    X 2 FOR LEFT HIP  . TRANSTHORACIC ECHOCARDIOGRAM  10/06/2012   EF 55-60%, mod eccentric hypertrophy, grade 2 diastolic dysfunction; mildly calcifed AV annulus with moderate regurg; aortic root mildly dilated; LA severely dailted; PA peak pressure 53mHg  . VARICOSE VEIN SURGERY     Current Outpatient Medications on File Prior to Visit  Medication Sig Dispense Refill  . cyanocobalamin 1000 MCG tablet Take 1,000 mcg by mouth daily.    Marland Kitchen docusate sodium (COLACE) 100 MG capsule Take 200 mg by mouth 2 (two) times daily.    . folic acid (FOLVITE) 1 MG tablet TAKE 1 TABLET BY MOUTH DAILY 30 tablet 5  . gabapentin (NEURONTIN) 100 MG capsule TAKE 2 CAPSULES BY MOUTH 3  TIMES DAILY 540 capsule 1  . hydrocortisone (ANUSOL-HC) 25 MG suppository Place 1 suppository (25 mg total) rectally every 12 (twelve) hours. 12 suppository 1  . metolazone (ZAROXOLYN) 2.5 MG tablet Take 2.5 mg today and 2.5 mg tomorrow (30 minutes before torsemide) 3 tablet 0  . ondansetron (ZOFRAN) 4 MG tablet TAKE 1 TABLET BY MOUTH  EVERY 8 HOURS AS NEEDED FOR NAUSEA AND VOMITING 60  tablet 3  . pantoprazole (PROTONIX) 40 MG tablet Take 1 tablet (40 mg total) by mouth 2 (two) times daily. 180 tablet 3  . potassium chloride SA (K-DUR,KLOR-CON) 20 MEQ tablet Take 1 tablet (20 mEq total) by mouth 2 (two) times daily. 180 tablet 3  . prednisoLONE acetate (PRED FORTE) 1 % ophthalmic suspension Place 1 drop into both eyes 4 (four) times daily.     . sodium chloride (MURO 128) 2 % ophthalmic solution Place 1 drop into both eyes 3 (three) times daily.     Marland Kitchen torsemide (DEMADEX) 20 MG tablet Take 20 mg daily, May take extra 20 mg if weight gain in 24 hrs is over  3 lbs (Patient taking differently: Take 40 mg by mouth once. Take 20 mg daily, May take extra 20 mg if weight gain in 24 hrs is over 3 lbs) 180 tablet 3  . traMADol-acetaminophen (ULTRACET) 37.5-325 MG tablet TAKE 1 TABLET BY MOUTH EVERY 4 HOURS AS NEEDED FOR PAIN 180 tablet 0   No current facility-administered medications on file prior to visit.    Allergies  Allergen Reactions  . Codeine Diarrhea and Nausea Only  . Tape Other (See Comments)    SKIN IS VERY THIN AND TEARS AND BRUISES EASILY; PLEASE USE COBAN WRAP!!  . Diona Fanti [Aspirin] Rash and Other (See Comments)    Petechia   . Iron Nausea And Vomiting    Oral iron causes nausea and vomiting  . Penicillins Rash    Has patient had a PCN reaction causing immediate rash, facial/tongue/throat swelling, SOB or lightheadedness with hypotension: Yes Has patient had a PCN reaction causing severe rash involving mucus membranes or skin necrosis: Unknown Has patient had a PCN reaction that required hospitalization: No Has patient had a PCN reaction occurring within the last 10 years: No If all of the above answers are "NO", then may proceed with Cephalosporin use.    Social History   Socioeconomic History  . Marital status: Widowed    Spouse name: Not on file  . Number of children: 5  . Years of education: 6  . Highest education level: Not on file  Occupational History     Employer: RETIRED  Social Needs  . Financial resource strain: Not on file  . Food insecurity:    Worry: Not on file    Inability: Not on file  . Transportation needs:    Medical: Not on file    Non-medical: Not on file  Tobacco Use  . Smoking status: Former Smoker    Packs/day: 1.50    Years: 20.00    Pack years: 30.00    Types: Cigarettes    Last attempt to quit: 07/05/1975    Years since quitting: 43.2  . Smokeless tobacco: Never Used  . Tobacco comment: Quit smoking in 1975  Substance and Sexual Activity  . Alcohol use: No  . Drug use: No  . Sexual activity: Never  Lifestyle  . Physical activity:    Days per week: Not on file    Minutes per session: Not on file  . Stress: Not on file  Relationships  . Social connections:    Talks on phone: Not on file    Gets together: Not on file    Attends religious service: Not on file    Active member of club or organization: Not on file    Attends meetings of clubs or organizations: Not on file    Relationship status: Not on file  . Intimate partner violence:    Fear of current or ex partner: Not on file    Emotionally abused: Not on file    Physically abused: Not on file    Forced sexual activity: Not on file  Other Topics Concern  . Not on file  Social History Narrative  . Not on file      Review of Systems  All other systems reviewed and are negative.      Objective:   Physical Exam Vitals signs reviewed.  Constitutional:      Appearance: She is well-developed.  Neck:     Vascular: No JVD.  Cardiovascular:     Rate and Rhythm: Normal rate. Rhythm  irregularly irregular.     Heart sounds: Normal heart sounds.  Pulmonary:     Effort: Pulmonary effort is normal. No tachypnea or respiratory distress.     Breath sounds: No wheezing or rales.  Chest:     Chest wall: No tenderness.  Abdominal:     General: Bowel sounds are normal. There is no distension.     Palpations: Abdomen is soft. There is no mass.      Tenderness: There is no abdominal tenderness. There is no guarding or rebound.  Musculoskeletal:     Right hip: She exhibits decreased range of motion, decreased strength, tenderness and bony tenderness.  Lymphadenopathy:     Cervical: No cervical adenopathy.          Assessment & Plan:  Dysuria - Plan: Urinalysis, Routine w reflex microscopic  Right hip pain  Bilateral hearing loss due to cerumen impaction  Dysuria appears to be a mild urinary tract infection.  We will treat this with Bactrim double strength tablets 1 p.o. twice daily for 5 days.  The cerumen impactions were removed from both ears using irrigation and lavage.  Patient tolerated the procedure well without complication.  Third we discussed treatment options for her right hip pain.  We discussed repeating an x-ray to rule out underlying malignancy in the bone potentially making the pain worse.  Pain could also be referred pain into the right hip from lumbar degenerative disc disease and lumbar radiculopathy.  However at the present time the family is not interested in pursuing epidural steroid injections or intra-articular hip injections.  The patient is not a good surgical candidate.  Therefore we will try Norco 5/325, 1/2 to 1 tablet every 8 hours as needed for pain.  I cautioned the family and the patient about increased sedation, dizziness, and confusion on the stronger pain medication.  They will use this sparingly in place of tramadol when the pain is severe and also monitor for evidence of confusion or constipation.

## 2018-09-23 ENCOUNTER — Encounter: Payer: Self-pay | Admitting: Family Medicine

## 2018-09-27 ENCOUNTER — Encounter: Payer: Self-pay | Admitting: Family Medicine

## 2018-09-27 DIAGNOSIS — M25551 Pain in right hip: Secondary | ICD-10-CM

## 2018-09-27 NOTE — Telephone Encounter (Signed)
Ok xray of right hip and pelvis

## 2018-09-29 ENCOUNTER — Ambulatory Visit (HOSPITAL_COMMUNITY)
Admission: RE | Admit: 2018-09-29 | Discharge: 2018-09-29 | Disposition: A | Discharge status: Home / Self Care | Payer: Medicare Other | Source: Ambulatory Visit | Attending: Family Medicine | Admitting: Family Medicine

## 2018-09-29 DIAGNOSIS — M25551 Pain in right hip: Secondary | ICD-10-CM | POA: Diagnosis not present

## 2018-09-29 DIAGNOSIS — S32401A Unspecified fracture of right acetabulum, initial encounter for closed fracture: Secondary | ICD-10-CM | POA: Diagnosis not present

## 2018-09-30 ENCOUNTER — Other Ambulatory Visit: Payer: Self-pay | Admitting: Family Medicine

## 2018-09-30 DIAGNOSIS — S72001A Fracture of unspecified part of neck of right femur, initial encounter for closed fracture: Secondary | ICD-10-CM

## 2018-10-04 ENCOUNTER — Encounter: Payer: Self-pay | Admitting: Orthopedic Surgery

## 2018-10-04 ENCOUNTER — Ambulatory Visit (INDEPENDENT_AMBULATORY_CARE_PROVIDER_SITE_OTHER): Payer: Medicare Other | Admitting: Orthopedic Surgery

## 2018-10-04 VITALS — BP 107/62 | HR 77 | Ht 65.0 in | Wt 163.0 lb

## 2018-10-04 DIAGNOSIS — S32434A Nondisplaced fracture of anterior column [iliopubic] of right acetabulum, initial encounter for closed fracture: Secondary | ICD-10-CM | POA: Diagnosis not present

## 2018-10-04 MED ORDER — TRAMADOL HCL 50 MG PO TABS: 50.0000 mg | ORAL_TABLET | Freq: Four times a day (QID) | ORAL | 5 refills | Status: DC | PRN

## 2018-10-04 NOTE — Progress Notes (Signed)
NEW PROBLEM/ESTABLISHED PATIENT  Chief Complaint  Patient presents with  . Pelvic Pain    right acetabular fracture, no injury    89 history of multiple medical problems as noted below  Previously treated for lumbar radicular pain and degenerative disc disease status post left total hip arthroplasty, presents with x-ray showing a acetabular fracture on the right hip.  Basically she is got about 2 months of increasing pain over her chronic pain dull pain right hip and groin some in the right iliac crest severe with weightbearing moderate to mild at rest associated with difficulty weightbearing   ROS Positive for bilateral leg edema secondary to congestive heart failure  History and continued back pain  Past Medical History:  Diagnosis Date  . Anemia in chronic kidney disease 06/02/2018  . Aortic insufficiency   . Ascending aortic aneurysm ([place])    a. 4.6cm by CT 2018 - pt deferred any further evaluation/invasive management therefore follow-up deferred.  . Bladder infection    h/o  . CHF (congestive heart failure) ([place])   . Chronic atrial fibrillation    a. not on anticoagulation due to prior GIB  . CKD (chronic kidney disease), stage III ([place])   . Coronary artery disease    a. mild nonobstructive by cath 2018.  Marland Kitchen DCIS (ductal carcinoma in situ) of breast 03/14/2013   right breast  . Dehydration    HISTORY   . Fall at home 09/10/12  . Hemorrhoids   . Hip fracture ([place])    hip surgery 2001  . History of blood clots    in leg  . History of knee surgery   . Hypertension   . Iron deficiency anemia, unspecified 03/14/2013   secondary to GI blood loss  . Kidney infection    h/o  . Melanoma of skin ([place]) 03/14/2013  . Melanoma of thigh ([place])    left  . Mini stroke ([place])   . Mitral regurgitation   . Nonischemic cardiomyopathy ([place])    a. EF 35-40% by echo in 03/2017 with cath showing nonobstructive CAD, suspected stress-induced.  . S/P colonoscopy March 2010   RMR: friable  anal canal hemorrhoids, hyperplastic ascending polyp, adenomatous descending polyp   . Stomach ulcer    secondary to h.pylori, s/p treatment  . Thyroid condition   . Tricuspid regurgitation   . Venous stasis    edema    Past Surgical History:  Procedure Laterality Date  . APPENDECTOMY  1942  . BREAST LUMPECTOMY  1998  . CARDIAC CATHETERIZATION    . CATARACT EXTRACTION, BILATERAL    . CHOLECYSTECTOMY    . COLONOSCOPY  06/05/11   pancolonic diverticulosis/ileal reosion/abnormal anorectal junction s/p biopsy: path for small intestine and Ti was benign with non-villous atrophy, rectal biopsy with prominent prolapse changes, no acute inflammation  . CORNEAL TRANSPLANT Bilateral 2013  . ENTEROSCOPY N/A 06/13/2013   KGU:[id number] Gastritis/GI BLEED MOST LIKELY DUE TO DUODENAL ULCERS, AND ? AVMs  . ESOPHAGOGASTRODUODENOSCOPY  06/05/11   small hiatal hernia; + H.PYLORI GASTRITIS, s/p 5 days of Pylera, unable to finish due to N/V  . ESOPHAGOGASTRODUODENOSCOPY N/A 06/13/2013   Dr. Oneida Alar- see enteroscopy  . ESOPHAGOGASTRODUODENOSCOPY (EGD) WITH ESOPHAGEAL DILATION N/A 06/08/2013   [id number] hiatal hernia;  otherwise normal EGD s/p dilation  . GIVENS CAPSULE STUDY N/A 06/08/2013   Procedure: GIVENS CAPSULE STUDY;  Surgeon: Daneil Dolin, MD;  [facility];  Service: Endoscopy;  Laterality: N/A;  . HEMORRHOID BANDING    . KNEE  SURGERY Right 05/2006   total right  . LEFT HEART CATH AND CORONARY ANGIOGRAPHY N/A 03/18/2017   Procedure: LEFT HEART CATH AND CORONARY ANGIOGRAPHY;  Surgeon: Wellington Hampshire, MD;  Location: [facility];  Service: Cardiovascular;  Laterality: N/A;  . LEG SURGERY    . MELANOMA EXCISION Left 08/2006   left leg excision  . NM MYOCAR PERF WALL MOTION  03/27/2010   dipyridamole; small reversible basal to mid-septal defect (?artifact), post-stress EF 55%, low risk scan   . PARTIAL HYSTERECTOMY  1976  . TONSILLECTOMY    . TOTAL HIP ARTHROPLASTY Left 2001&2004     X 2 FOR LEFT HIP  . TRANSTHORACIC ECHOCARDIOGRAM  10/06/2012   EF 55-60%, mod eccentric hypertrophy, grade 2 diastolic dysfunction; mildly calcifed AV annulus with moderate regurg; aortic root mildly dilated; LA severely dailted; PA peak pressure 64mHg  . VARICOSE VEIN SURGERY      Family History  Problem Relation Age of Onset  . Breast cancer Daughter        also hyperlipidemia  . Breast cancer Daughter        also hyperlipidemia  . Asthma Mother   . Multiple sclerosis Child   . Hyperlipidemia Child   . Colon cancer Neg Hx    Social History   Tobacco Use  . Smoking status: Former Smoker    Packs/day: 1.50    Years: 20.00    Pack years: 30.00    Types: Cigarettes    Last attempt to quit: 07/05/1975    Years since quitting: 43.2  . Smokeless tobacco: Never Used  . Tobacco comment: Quit smoking in 1975  Substance Use Topics  . Alcohol use: No  . Drug use: No    Allergies  Allergen Reactions  . Codeine Diarrhea and Nausea Only  . Tape Other (See Comments)    SKIN IS VERY THIN AND TEARS AND BRUISES EASILY; PLEASE USE COBAN WRAP!!  . Diona Fanti [Aspirin] Rash and Other (See Comments)    Petechia   . Iron Nausea And Vomiting    Oral iron causes nausea and vomiting  . Penicillins Rash    Has patient had a PCN reaction causing immediate rash, facial/tongue/throat swelling, SOB or lightheadedness with hypotension: Yes Has patient had a PCN reaction causing severe rash involving mucus membranes or skin necrosis: Unknown Has patient had a PCN reaction that required hospitalization: No Has patient had a PCN reaction occurring within the last 10 years: No If all of the above answers are "NO", then may proceed with Cephalosporin use.     Current Meds  Medication Sig  . docusate sodium (COLACE) 100 MG capsule Take 200 mg by mouth 2 (two) times daily.  Marland Kitchen gabapentin (NEURONTIN) 100 MG capsule TAKE 2 CAPSULES BY MOUTH 3  TIMES DAILY  . hydrocortisone (ANUSOL-HC) 25 MG suppository Place 1  suppository (25 mg total) rectally every 12 (twelve) hours.  . ondansetron (ZOFRAN) 4 MG tablet TAKE 1 TABLET BY MOUTH  EVERY 8 HOURS AS NEEDED FOR NAUSEA AND VOMITING  . pantoprazole (PROTONIX) 40 MG tablet Take 1 tablet (40 mg total) by mouth 2 (two) times daily.  . potassium chloride SA (K-DUR,KLOR-CON) 20 MEQ tablet Take 1 tablet (20 mEq total) by mouth 2 (two) times daily.  . prednisoLONE acetate (PRED FORTE) 1 % ophthalmic suspension Place 1 drop into both eyes 4 (four) times daily.   . sodium chloride (MURO 128) 2 % ophthalmic solution Place 1 drop into both eyes 3 (three)  times daily.   Marland Kitchen torsemide (DEMADEX) 20 MG tablet Take 20 mg daily, May take extra 20 mg if weight gain in 24 hrs is over 3 lbs (Patient taking differently: Take 40 mg by mouth once. Take 20 mg daily, May take extra 20 mg if weight gain in 24 hrs is over 3 lbs)  . traMADol-acetaminophen (ULTRACET) 37.5-325 MG tablet TAKE 1 TABLET BY MOUTH EVERY 4 HOURS AS NEEDED FOR PAIN    BP 107/62   Pulse 77   Ht 5\' 5"  (1.651 m)   Wt 163 lb (73.9 kg) Comment: in wheelchair stated weight 163#  BMI 27.12 kg/m   Physical Exam Vitals signs reviewed.  Constitutional:      Appearance: Normal appearance. She is well-developed.  Neurological:     Mental Status: She is alert and oriented to person, place, and time.     Gait: Gait abnormal.     Comments: Wheelchair  Psychiatric:        Attention and Perception: Attention normal.        Mood and Affect: Mood and affect normal.        Speech: Speech normal.        Behavior: Behavior normal.        Thought Content: Thought content normal.        Judgment: Judgment normal.     Ortho Exam  Leg lengths are equal. Hip range of motion the left hip good no tenderness no instability muscle tone normal skin intact sensation normal peripheral edema   Painful range of motion of the right hip without instability muscle tone normal skin intact sensation normal peripheral edema painful hip  flexion  MEDICAL DECISION SECTION  Xrays were done at [facility]   My independent reading of xrays:  The prosthesis and it did stable the right hip has an anterior column acetabular fracture nondisplaced  Encounter Diagnosis  Name Primary?  . Closed nondisplaced fracture of anterior column of right acetabulum, initial encounter ([place]) Yes    PLAN: (Rx., injectx, surgery, frx, mri/ct) Weightbearing as tolerated with a walker  Probably needs home therapy if not send the ER for admission and eventual admission to skilled care  Meds ordered this encounter  Medications  . traMADol (ULTRAM) 50 MG tablet    Sig: Take 1 tablet (50 mg total) by mouth every 6 (six) hours as needed.    Dispense:  60 tablet    Refill:  5    Arther Abbott, MD  10/04/2018 1:55 PM

## 2018-10-06 ENCOUNTER — Telehealth: Payer: Self-pay | Admitting: Orthopedic Surgery

## 2018-10-06 NOTE — Telephone Encounter (Signed)
Kecia from [facility] called to relay that patient will have her home visit tomorrow, 10/07/2018. States it is the 48th hour, and they are to notify Dr if 48 hours from time orders received.

## 2018-10-07 ENCOUNTER — Telehealth: Payer: Self-pay | Admitting: Orthopedic Surgery

## 2018-10-07 ENCOUNTER — Encounter: Payer: Self-pay | Admitting: Family Medicine

## 2018-10-07 DIAGNOSIS — Z7952 Long term (current) use of systemic steroids: Secondary | ICD-10-CM | POA: Diagnosis not present

## 2018-10-07 DIAGNOSIS — D631 Anemia in chronic kidney disease: Secondary | ICD-10-CM | POA: Diagnosis not present

## 2018-10-07 DIAGNOSIS — I2510 Atherosclerotic heart disease of native coronary artery without angina pectoris: Secondary | ICD-10-CM | POA: Diagnosis not present

## 2018-10-07 DIAGNOSIS — Z87440 Personal history of urinary (tract) infections: Secondary | ICD-10-CM | POA: Diagnosis not present

## 2018-10-07 DIAGNOSIS — I351 Nonrheumatic aortic (valve) insufficiency: Secondary | ICD-10-CM | POA: Diagnosis not present

## 2018-10-07 DIAGNOSIS — I081 Rheumatic disorders of both mitral and tricuspid valves: Secondary | ICD-10-CM | POA: Diagnosis not present

## 2018-10-07 DIAGNOSIS — I482 Chronic atrial fibrillation, unspecified: Secondary | ICD-10-CM | POA: Diagnosis not present

## 2018-10-07 DIAGNOSIS — I130 Hypertensive heart and chronic kidney disease with heart failure and stage 1 through stage 4 chronic kidney disease, or unspecified chronic kidney disease: Secondary | ICD-10-CM | POA: Diagnosis not present

## 2018-10-07 DIAGNOSIS — Z9181 History of falling: Secondary | ICD-10-CM | POA: Diagnosis not present

## 2018-10-07 DIAGNOSIS — I428 Other cardiomyopathies: Secondary | ICD-10-CM | POA: Diagnosis not present

## 2018-10-07 DIAGNOSIS — Z96642 Presence of left artificial hip joint: Secondary | ICD-10-CM | POA: Diagnosis not present

## 2018-10-07 DIAGNOSIS — I872 Venous insufficiency (chronic) (peripheral): Secondary | ICD-10-CM | POA: Diagnosis not present

## 2018-10-07 DIAGNOSIS — Z79891 Long term (current) use of opiate analgesic: Secondary | ICD-10-CM | POA: Diagnosis not present

## 2018-10-07 DIAGNOSIS — M84451D Pathological fracture, right femur, subsequent encounter for fracture with routine healing: Secondary | ICD-10-CM | POA: Diagnosis not present

## 2018-10-07 DIAGNOSIS — Z85820 Personal history of malignant melanoma of skin: Secondary | ICD-10-CM | POA: Diagnosis not present

## 2018-10-07 DIAGNOSIS — Z87891 Personal history of nicotine dependence: Secondary | ICD-10-CM | POA: Diagnosis not present

## 2018-10-07 DIAGNOSIS — Z86000 Personal history of in-situ neoplasm of breast: Secondary | ICD-10-CM | POA: Diagnosis not present

## 2018-10-07 DIAGNOSIS — N183 Chronic kidney disease, stage 3 (moderate): Secondary | ICD-10-CM | POA: Diagnosis not present

## 2018-10-07 DIAGNOSIS — M5116 Intervertebral disc disorders with radiculopathy, lumbar region: Secondary | ICD-10-CM | POA: Diagnosis not present

## 2018-10-07 DIAGNOSIS — I509 Heart failure, unspecified: Secondary | ICD-10-CM | POA: Diagnosis not present

## 2018-10-07 DIAGNOSIS — E079 Disorder of thyroid, unspecified: Secondary | ICD-10-CM | POA: Diagnosis not present

## 2018-10-07 DIAGNOSIS — I712 Thoracic aortic aneurysm, without rupture: Secondary | ICD-10-CM | POA: Diagnosis not present

## 2018-10-07 NOTE — Telephone Encounter (Signed)
Patient's sister called to say that the therapist came out today and said that patient should contact Dr. Ruthe Mannan office and ask if he would prescribe something for spasms. Stated that patient is having spasms in her leg.  PATIENT USES [facility]

## 2018-10-08 ENCOUNTER — Telehealth: Payer: Self-pay | Admitting: Orthopedic Surgery

## 2018-10-08 ENCOUNTER — Other Ambulatory Visit: Payer: Self-pay | Admitting: Family Medicine

## 2018-10-08 ENCOUNTER — Other Ambulatory Visit: Payer: Self-pay | Admitting: Orthopedic Surgery

## 2018-10-08 DIAGNOSIS — S72001A Fracture of unspecified part of neck of right femur, initial encounter for closed fracture: Secondary | ICD-10-CM

## 2018-10-08 DIAGNOSIS — I429 Cardiomyopathy, unspecified: Secondary | ICD-10-CM

## 2018-10-08 MED ORDER — CYCLOBENZAPRINE HCL 5 MG PO TABS: 5.0000 mg | ORAL_TABLET | Freq: Three times a day (TID) | ORAL | 0 refills | Status: AC | PRN

## 2018-10-08 NOTE — Telephone Encounter (Signed)
Joey from [facility] called and asks that you call him back with verbal orders for PT on this patient.  Joey's phone number is [phone]  Thanks

## 2018-10-08 NOTE — Telephone Encounter (Signed)
I have gotten return call 1x week one the twice weekly x 8 weeks

## 2018-10-08 NOTE — Telephone Encounter (Signed)
I called with Verbal order. I have asked him to call back and let me know frequency of visits so I can document

## 2018-10-11 ENCOUNTER — Encounter: Payer: Self-pay | Admitting: Family Medicine

## 2018-10-12 ENCOUNTER — Ambulatory Visit (INDEPENDENT_AMBULATORY_CARE_PROVIDER_SITE_OTHER): Payer: Medicare Other | Admitting: Family Medicine

## 2018-10-12 ENCOUNTER — Other Ambulatory Visit: Payer: Self-pay

## 2018-10-12 ENCOUNTER — Encounter: Payer: Self-pay | Admitting: Family Medicine

## 2018-10-12 VITALS — BP 100/56 | HR 74 | Temp 97.5°F | Resp 14 | Ht 65.0 in

## 2018-10-12 DIAGNOSIS — J069 Acute upper respiratory infection, unspecified: Secondary | ICD-10-CM | POA: Diagnosis not present

## 2018-10-12 DIAGNOSIS — S72001A Fracture of unspecified part of neck of right femur, initial encounter for closed fracture: Secondary | ICD-10-CM

## 2018-10-12 MED ORDER — OXYCODONE-ACETAMINOPHEN 5-325 MG PO TABS: 1.0000 | ORAL_TABLET | ORAL | 0 refills | Status: AC | PRN

## 2018-10-12 MED ORDER — AZITHROMYCIN 250 MG PO TABS: ORAL_TABLET | ORAL | 0 refills | Status: AC

## 2018-10-12 NOTE — Progress Notes (Signed)
Subjective:    Patient ID: [name] [name], female    DOB: May 26, 1923, 85 y.o.   MRN: [id number]  HPI Due to worsening right-sided hip pain, I recently ordered an x-ray of the right hip and the patient was found to have: IMPRESSION: Interval development of mildly displaced right acetabular fracture of indeterminate age. CT scan is recommended for further evaluation.  [name] [name] has since seen orthopedics who due to her advanced age and medical comorbidities have recommended no treatment other than physical therapy and pain control.  I recommended a palliative care consult to assist in pain control however this was can be performed through hospice and the family was concerned that hospice would cause the patient to feel like we were giving up on her.  Therefore they have declined hospice consultation and have instead asked me to increase her pain medication.  Patient presents with a 4-day history of worsening cough.  Daughter states is a croupy cough worse at night with wheezing.  Here today on exam, the patient is comfortable sitting in her wheelchair.  There is no respiratory distress.  There is no increased work of breathing however the patient does have rhonchorous breath sounds on the left-hand side with left basilar posterior crackles concerning for early possibly developing pneumonia versus atelectasis and airway congestion. Past Medical History:  Diagnosis Date  . Anemia in chronic kidney disease 06/02/2018  . Aortic insufficiency   . Ascending aortic aneurysm ([place])    a. 4.6cm by CT 2018 - pt deferred any further evaluation/invasive management therefore follow-up deferred.  . Bladder infection    h/o  . CHF (congestive heart failure) ([place])   . Chronic atrial fibrillation    a. not on anticoagulation due to prior GIB  . CKD (chronic kidney disease), stage III ([place])   . Coronary artery disease    a. mild nonobstructive by cath 2018.  Marland Kitchen DCIS (ductal carcinoma in situ) of breast 03/14/2013     right breast  . Dehydration    HISTORY   . Fall at home 09/10/12  . Hemorrhoids   . Hip fracture ([place])    hip surgery 2001  . History of blood clots    in leg  . History of knee surgery   . Hypertension   . Iron deficiency anemia, unspecified 03/14/2013   secondary to GI blood loss  . Kidney infection    h/o  . Melanoma of skin ([place]) 03/14/2013  . Melanoma of thigh ([place])    left  . Mini stroke ([place])   . Mitral regurgitation   . Nonischemic cardiomyopathy ([place])    a. EF 35-40% by echo in 03/2017 with cath showing nonobstructive CAD, suspected stress-induced.  . S/P colonoscopy March 2010   RMR: friable anal canal hemorrhoids, hyperplastic ascending polyp, adenomatous descending polyp   . Stomach ulcer    secondary to h.pylori, s/p treatment  . Thyroid condition   . Tricuspid regurgitation   . Venous stasis    edema   Past Surgical History:  Procedure Laterality Date  . APPENDECTOMY  1942  . BREAST LUMPECTOMY  1998  . CARDIAC CATHETERIZATION    . CATARACT EXTRACTION, BILATERAL    . CHOLECYSTECTOMY    . COLONOSCOPY  06/05/11   pancolonic diverticulosis/ileal reosion/abnormal anorectal junction s/p biopsy: path for small intestine and Ti was benign with non-villous atrophy, rectal biopsy with prominent prolapse changes, no acute inflammation  . CORNEAL TRANSPLANT Bilateral 2013  . ENTEROSCOPY N/A 06/13/2013   [facility]:NOBS Gastritis/GI  BLEED MOST LIKELY DUE TO DUODENAL ULCERS, AND ? AVMs  . ESOPHAGOGASTRODUODENOSCOPY  06/05/11   small hiatal hernia; + H.PYLORI GASTRITIS, s/p 5 days of Pylera, unable to finish due to N/V  . ESOPHAGOGASTRODUODENOSCOPY N/A 06/13/2013   Dr. Oneida Alar- see enteroscopy  . ESOPHAGOGASTRODUODENOSCOPY (EGD) WITH ESOPHAGEAL DILATION N/A 06/08/2013   [facility]:TDSKA hiatal hernia;  otherwise normal EGD s/p dilation  . GIVENS CAPSULE STUDY N/A 06/08/2013   Procedure: GIVENS CAPSULE STUDY;  Surgeon: Daneil Dolin, MD;  Location: [facility];  Service: Endoscopy;   Laterality: N/A;  . HEMORRHOID BANDING    . KNEE SURGERY Right 05/2006   total right  . LEFT HEART CATH AND CORONARY ANGIOGRAPHY N/A 03/18/2017   Procedure: LEFT HEART CATH AND CORONARY ANGIOGRAPHY;  Surgeon: Wellington Hampshire, MD;  Location: [facility];  Service: Cardiovascular;  Laterality: N/A;  . LEG SURGERY    . MELANOMA EXCISION Left 08/2006   left leg excision  . NM MYOCAR PERF WALL MOTION  03/27/2010   dipyridamole; small reversible basal to mid-septal defect (?artifact), post-stress EF 55%, low risk scan   . PARTIAL HYSTERECTOMY  1976  . TONSILLECTOMY    . TOTAL HIP ARTHROPLASTY Left 2001&2004    X 2 FOR LEFT HIP  . TRANSTHORACIC ECHOCARDIOGRAM  10/06/2012   EF 55-60%, mod eccentric hypertrophy, grade 2 diastolic dysfunction; mildly calcifed AV annulus with moderate regurg; aortic root mildly dilated; LA severely dailted; PA peak pressure 33mHg  . VARICOSE VEIN SURGERY     Current Outpatient Medications on File Prior to Visit  Medication Sig Dispense Refill  . cyanocobalamin 1000 MCG tablet Take 1,000 mcg by mouth daily.    . cyclobenzaprine (FLEXERIL) 5 MG tablet Take 1 tablet (5 mg total) by mouth 3 (three) times daily as needed for muscle spasms. 30 tablet 0  . docusate sodium (COLACE) 100 MG capsule Take 200 mg by mouth 2 (two) times daily.    . folic acid (FOLVITE) 1 MG tablet TAKE 1 TABLET BY MOUTH DAILY (Patient not taking: Reported on 10/04/2018) 30 tablet 5  . gabapentin (NEURONTIN) 100 MG capsule TAKE 2 CAPSULES BY MOUTH 3  TIMES DAILY 540 capsule 1  . hydrocortisone (ANUSOL-HC) 25 MG suppository Place 1 suppository (25 mg total) rectally every 12 (twelve) hours. 12 suppository 1  . metolazone (ZAROXOLYN) 2.5 MG tablet Take 2.5 mg today and 2.5 mg tomorrow (30 minutes before torsemide) (Patient not taking: Reported on 10/04/2018) 3 tablet 0  . ondansetron (ZOFRAN) 4 MG tablet TAKE 1 TABLET BY MOUTH  EVERY 8 HOURS AS NEEDED FOR NAUSEA AND VOMITING 60 tablet 3  .  pantoprazole (PROTONIX) 40 MG tablet Take 1 tablet (40 mg total) by mouth 2 (two) times daily. 180 tablet 3  . potassium chloride SA (K-DUR,KLOR-CON) 20 MEQ tablet Take 1 tablet (20 mEq total) by mouth 2 (two) times daily. 180 tablet 3  . prednisoLONE acetate (PRED FORTE) 1 % ophthalmic suspension Place 1 drop into both eyes 4 (four) times daily.     . sodium chloride (MURO 128) 2 % ophthalmic solution Place 1 drop into both eyes 3 (three) times daily.     Marland Kitchen torsemide (DEMADEX) 20 MG tablet Take 20 mg daily, May take extra 20 mg if weight gain in 24 hrs is over 3 lbs (Patient taking differently: Take 40 mg by mouth once. Take 20 mg daily, May take extra 20 mg if weight gain in 24 hrs is over 3 lbs) 180 tablet  3  . traMADol (ULTRAM) 50 MG tablet Take 1 tablet (50 mg total) by mouth every 6 (six) hours as needed. 60 tablet 5  . traMADol-acetaminophen (ULTRACET) 37.5-325 MG tablet TAKE 1 TABLET BY MOUTH EVERY 4 HOURS AS NEEDED FOR PAIN 180 tablet 0   No current facility-administered medications on file prior to visit.    Allergies  Allergen Reactions  . Codeine Diarrhea and Nausea Only  . Tape Other (See Comments)    SKIN IS VERY THIN AND TEARS AND BRUISES EASILY; PLEASE USE COBAN WRAP!!  . Diona Fanti [Aspirin] Rash and Other (See Comments)    Petechia   . Iron Nausea And Vomiting    Oral iron causes nausea and vomiting  . Penicillins Rash    Has patient had a PCN reaction causing immediate rash, facial/tongue/throat swelling, SOB or lightheadedness with hypotension: Yes Has patient had a PCN reaction causing severe rash involving mucus membranes or skin necrosis: Unknown Has patient had a PCN reaction that required hospitalization: No Has patient had a PCN reaction occurring within the last 10 years: No If all of the above answers are "NO", then may proceed with Cephalosporin use.    Social History   Socioeconomic History  . Marital status: Widowed    Spouse name: Not on file  . Number of  children: 5  . Years of education: 6  . Highest education level: Not on file  Occupational History    Employer: RETIRED  Social Needs  . Financial resource strain: Not on file  . Food insecurity:    Worry: Not on file    Inability: Not on file  . Transportation needs:    Medical: Not on file    Non-medical: Not on file  Tobacco Use  . Smoking status: Former Smoker    Packs/day: 1.50    Years: 20.00    Pack years: 30.00    Types: Cigarettes    Last attempt to quit: 07/05/1975    Years since quitting: 43.3  . Smokeless tobacco: Never Used  . Tobacco comment: Quit smoking in 1975  Substance and Sexual Activity  . Alcohol use: No  . Drug use: No  . Sexual activity: Never  Lifestyle  . Physical activity:    Days per week: Not on file    Minutes per session: Not on file  . Stress: Not on file  Relationships  . Social connections:    Talks on phone: Not on file    Gets together: Not on file    Attends religious service: Not on file    Active member of club or organization: Not on file    Attends meetings of clubs or organizations: Not on file    Relationship status: Not on file  . Intimate partner violence:    Fear of current or ex partner: Not on file    Emotionally abused: Not on file    Physically abused: Not on file    Forced sexual activity: Not on file  Other Topics Concern  . Not on file  Social History Narrative  . Not on file      Review of Systems  All other systems reviewed and are negative.      Objective:   Physical Exam Vitals signs reviewed.  Constitutional:      Appearance: [name] [name] is well-developed.  Neck:     Vascular: No JVD.  Cardiovascular:     Rate and Rhythm: Normal rate. Rhythm irregularly irregular.     Heart sounds:  Normal heart sounds.  Pulmonary:     Effort: Pulmonary effort is normal. No tachypnea, accessory muscle usage or respiratory distress.     Breath sounds: No decreased air movement. Examination of the left-middle field  reveals rhonchi. Examination of the left-lower field reveals rhonchi and rales. Rhonchi and rales present. No wheezing.  Chest:     Chest wall: No tenderness.  Abdominal:     General: Bowel sounds are normal. There is no distension.     Palpations: Abdomen is soft. There is no mass.     Tenderness: There is no abdominal tenderness. There is no guarding or rebound.  Musculoskeletal:     Right hip: [name] [name] exhibits decreased range of motion, decreased strength, tenderness and bony tenderness.  Lymphadenopathy:     Cervical: No cervical adenopathy.          Assessment & Plan:  Samuel Germany Right acetabular fracture Patient most likely has a viral upper respiratory infection however given her advanced age, congestive heart failure, and abnormal pulmonary exam, I will start the patient on a Z-Pak for possible early community-acquired pneumonia and use Mucinex as a mucolytic.  I have encouraged incentive spirometry and deep breathing exercises.  I recommended treating her right acetabular fracture with Percocet 5/325 1 p.o. every 6 hours as needed pain.  I cautioned the family to watch for sedation, confusion, and constipation on this pain medication.  I recommended using MiraLAX daily to prevent constipation and then Dulcolax as a stimulant laxative as needed to promote bowel movements.

## 2018-10-13 DIAGNOSIS — D631 Anemia in chronic kidney disease: Secondary | ICD-10-CM | POA: Diagnosis not present

## 2018-10-13 DIAGNOSIS — I482 Chronic atrial fibrillation, unspecified: Secondary | ICD-10-CM | POA: Diagnosis not present

## 2018-10-13 DIAGNOSIS — I081 Rheumatic disorders of both mitral and tricuspid valves: Secondary | ICD-10-CM | POA: Diagnosis not present

## 2018-10-13 DIAGNOSIS — I428 Other cardiomyopathies: Secondary | ICD-10-CM | POA: Diagnosis not present

## 2018-10-13 DIAGNOSIS — Z87440 Personal history of urinary (tract) infections: Secondary | ICD-10-CM | POA: Diagnosis not present

## 2018-10-13 DIAGNOSIS — I2510 Atherosclerotic heart disease of native coronary artery without angina pectoris: Secondary | ICD-10-CM | POA: Diagnosis not present

## 2018-10-13 DIAGNOSIS — E079 Disorder of thyroid, unspecified: Secondary | ICD-10-CM | POA: Diagnosis not present

## 2018-10-13 DIAGNOSIS — I872 Venous insufficiency (chronic) (peripheral): Secondary | ICD-10-CM | POA: Diagnosis not present

## 2018-10-13 DIAGNOSIS — I351 Nonrheumatic aortic (valve) insufficiency: Secondary | ICD-10-CM | POA: Diagnosis not present

## 2018-10-13 DIAGNOSIS — Z9181 History of falling: Secondary | ICD-10-CM | POA: Diagnosis not present

## 2018-10-13 DIAGNOSIS — I509 Heart failure, unspecified: Secondary | ICD-10-CM | POA: Diagnosis not present

## 2018-10-13 DIAGNOSIS — M84451D Pathological fracture, right femur, subsequent encounter for fracture with routine healing: Secondary | ICD-10-CM | POA: Diagnosis not present

## 2018-10-13 DIAGNOSIS — Z96642 Presence of left artificial hip joint: Secondary | ICD-10-CM | POA: Diagnosis not present

## 2018-10-13 DIAGNOSIS — I712 Thoracic aortic aneurysm, without rupture: Secondary | ICD-10-CM | POA: Diagnosis not present

## 2018-10-13 DIAGNOSIS — Z85820 Personal history of malignant melanoma of skin: Secondary | ICD-10-CM | POA: Diagnosis not present

## 2018-10-13 DIAGNOSIS — Z79891 Long term (current) use of opiate analgesic: Secondary | ICD-10-CM | POA: Diagnosis not present

## 2018-10-13 DIAGNOSIS — N183 Chronic kidney disease, stage 3 (moderate): Secondary | ICD-10-CM | POA: Diagnosis not present

## 2018-10-13 DIAGNOSIS — Z87891 Personal history of nicotine dependence: Secondary | ICD-10-CM | POA: Diagnosis not present

## 2018-10-13 DIAGNOSIS — M5116 Intervertebral disc disorders with radiculopathy, lumbar region: Secondary | ICD-10-CM | POA: Diagnosis not present

## 2018-10-13 DIAGNOSIS — Z7952 Long term (current) use of systemic steroids: Secondary | ICD-10-CM | POA: Diagnosis not present

## 2018-10-13 DIAGNOSIS — I130 Hypertensive heart and chronic kidney disease with heart failure and stage 1 through stage 4 chronic kidney disease, or unspecified chronic kidney disease: Secondary | ICD-10-CM | POA: Diagnosis not present

## 2018-10-13 DIAGNOSIS — Z86000 Personal history of in-situ neoplasm of breast: Secondary | ICD-10-CM | POA: Diagnosis not present

## 2018-10-14 DIAGNOSIS — I351 Nonrheumatic aortic (valve) insufficiency: Secondary | ICD-10-CM | POA: Diagnosis not present

## 2018-10-14 DIAGNOSIS — N183 Chronic kidney disease, stage 3 (moderate): Secondary | ICD-10-CM | POA: Diagnosis not present

## 2018-10-14 DIAGNOSIS — I482 Chronic atrial fibrillation, unspecified: Secondary | ICD-10-CM | POA: Diagnosis not present

## 2018-10-14 DIAGNOSIS — Z86000 Personal history of in-situ neoplasm of breast: Secondary | ICD-10-CM | POA: Diagnosis not present

## 2018-10-14 DIAGNOSIS — M5116 Intervertebral disc disorders with radiculopathy, lumbar region: Secondary | ICD-10-CM | POA: Diagnosis not present

## 2018-10-14 DIAGNOSIS — I130 Hypertensive heart and chronic kidney disease with heart failure and stage 1 through stage 4 chronic kidney disease, or unspecified chronic kidney disease: Secondary | ICD-10-CM | POA: Diagnosis not present

## 2018-10-14 DIAGNOSIS — I428 Other cardiomyopathies: Secondary | ICD-10-CM | POA: Diagnosis not present

## 2018-10-14 DIAGNOSIS — M84451D Pathological fracture, right femur, subsequent encounter for fracture with routine healing: Secondary | ICD-10-CM | POA: Diagnosis not present

## 2018-10-14 DIAGNOSIS — Z9181 History of falling: Secondary | ICD-10-CM | POA: Diagnosis not present

## 2018-10-14 DIAGNOSIS — I712 Thoracic aortic aneurysm, without rupture: Secondary | ICD-10-CM | POA: Diagnosis not present

## 2018-10-14 DIAGNOSIS — I872 Venous insufficiency (chronic) (peripheral): Secondary | ICD-10-CM | POA: Diagnosis not present

## 2018-10-14 DIAGNOSIS — E079 Disorder of thyroid, unspecified: Secondary | ICD-10-CM | POA: Diagnosis not present

## 2018-10-14 DIAGNOSIS — I081 Rheumatic disorders of both mitral and tricuspid valves: Secondary | ICD-10-CM | POA: Diagnosis not present

## 2018-10-14 DIAGNOSIS — I2510 Atherosclerotic heart disease of native coronary artery without angina pectoris: Secondary | ICD-10-CM | POA: Diagnosis not present

## 2018-10-14 DIAGNOSIS — Z87891 Personal history of nicotine dependence: Secondary | ICD-10-CM | POA: Diagnosis not present

## 2018-10-14 DIAGNOSIS — D631 Anemia in chronic kidney disease: Secondary | ICD-10-CM | POA: Diagnosis not present

## 2018-10-14 DIAGNOSIS — I509 Heart failure, unspecified: Secondary | ICD-10-CM | POA: Diagnosis not present

## 2018-10-14 DIAGNOSIS — Z87440 Personal history of urinary (tract) infections: Secondary | ICD-10-CM | POA: Diagnosis not present

## 2018-10-14 DIAGNOSIS — Z79891 Long term (current) use of opiate analgesic: Secondary | ICD-10-CM | POA: Diagnosis not present

## 2018-10-14 DIAGNOSIS — Z85820 Personal history of malignant melanoma of skin: Secondary | ICD-10-CM | POA: Diagnosis not present

## 2018-10-14 DIAGNOSIS — Z96642 Presence of left artificial hip joint: Secondary | ICD-10-CM | POA: Diagnosis not present

## 2018-10-14 DIAGNOSIS — Z7952 Long term (current) use of systemic steroids: Secondary | ICD-10-CM | POA: Diagnosis not present

## 2018-10-15 ENCOUNTER — Encounter: Payer: Self-pay | Admitting: Family Medicine

## 2018-10-18 ENCOUNTER — Encounter: Payer: Self-pay | Admitting: Family Medicine

## 2018-10-19 MED ORDER — TRAMADOL HCL 50 MG PO TABS: 50.0000 mg | ORAL_TABLET | ORAL | 2 refills | Status: AC

## 2018-10-19 NOTE — Telephone Encounter (Signed)
Please send tramadol 

## 2018-11-04 ENCOUNTER — Telehealth: Payer: Self-pay | Admitting: Orthopedic Surgery

## 2018-11-04 NOTE — Telephone Encounter (Signed)
Patient's daughter(Frances Paschal) called wanting to cancel her mom's appointment for 11/15/18. Stated mom was under Hospice care now and she didn't think there was anything that Dr. Aline Brochure could do anyway. I told her that this is a Fracture care appointment with xrays scheduled.She then said she needed to speak with Hospice before canceling this appointment and will call us back later.

## 2018-11-15 ENCOUNTER — Ambulatory Visit: Payer: Medicaid Other | Admitting: Orthopedic Surgery

## 2018-12-03 DEATH — deceased

## 2018-12-15 ENCOUNTER — Other Ambulatory Visit (HOSPITAL_COMMUNITY): Payer: Medicare Other

## 2018-12-16 ENCOUNTER — Ambulatory Visit (HOSPITAL_COMMUNITY): Payer: Medicare Other | Admitting: Nurse Practitioner
